# Patient Record
Sex: Male | Born: 1950 | Race: Black or African American | Hispanic: No | Smoking: Former smoker
Health system: Southern US, Community
[De-identification: ages and names within clinical notes are randomized; demographics above are authoritative.]

## PROBLEM LIST (undated history)

## (undated) DIAGNOSIS — I2510 Atherosclerotic heart disease of native coronary artery without angina pectoris: Secondary | ICD-10-CM

## (undated) DIAGNOSIS — K859 Acute pancreatitis without necrosis or infection, unspecified: Secondary | ICD-10-CM

## (undated) DIAGNOSIS — F1120 Opioid dependence, uncomplicated: Secondary | ICD-10-CM

## (undated) DIAGNOSIS — G8929 Other chronic pain: Secondary | ICD-10-CM

## (undated) DIAGNOSIS — M545 Low back pain, unspecified: Secondary | ICD-10-CM

## (undated) DIAGNOSIS — R7611 Nonspecific reaction to tuberculin skin test without active tuberculosis: Secondary | ICD-10-CM

## (undated) DIAGNOSIS — I4892 Unspecified atrial flutter: Secondary | ICD-10-CM

## (undated) DIAGNOSIS — E785 Hyperlipidemia, unspecified: Secondary | ICD-10-CM

## (undated) DIAGNOSIS — I10 Essential (primary) hypertension: Secondary | ICD-10-CM

## (undated) DIAGNOSIS — E119 Type 2 diabetes mellitus without complications: Secondary | ICD-10-CM

## (undated) DIAGNOSIS — J189 Pneumonia, unspecified organism: Secondary | ICD-10-CM

## (undated) DIAGNOSIS — IMO0002 Reserved for concepts with insufficient information to code with codable children: Secondary | ICD-10-CM

## (undated) DIAGNOSIS — K219 Gastro-esophageal reflux disease without esophagitis: Secondary | ICD-10-CM

## (undated) DIAGNOSIS — G9340 Encephalopathy, unspecified: Secondary | ICD-10-CM

## (undated) DIAGNOSIS — J449 Chronic obstructive pulmonary disease, unspecified: Secondary | ICD-10-CM

## (undated) DIAGNOSIS — K429 Umbilical hernia without obstruction or gangrene: Secondary | ICD-10-CM

## (undated) DIAGNOSIS — B1910 Unspecified viral hepatitis B without hepatic coma: Secondary | ICD-10-CM

## (undated) DIAGNOSIS — I509 Heart failure, unspecified: Secondary | ICD-10-CM

## (undated) DIAGNOSIS — F4310 Post-traumatic stress disorder, unspecified: Secondary | ICD-10-CM

## (undated) DIAGNOSIS — M4800 Spinal stenosis, site unspecified: Secondary | ICD-10-CM

## (undated) DIAGNOSIS — F329 Major depressive disorder, single episode, unspecified: Secondary | ICD-10-CM

## (undated) DIAGNOSIS — K469 Unspecified abdominal hernia without obstruction or gangrene: Secondary | ICD-10-CM

## (undated) DIAGNOSIS — F419 Anxiety disorder, unspecified: Secondary | ICD-10-CM

## (undated) DIAGNOSIS — K56609 Unspecified intestinal obstruction, unspecified as to partial versus complete obstruction: Secondary | ICD-10-CM

## (undated) DIAGNOSIS — I82409 Acute embolism and thrombosis of unspecified deep veins of unspecified lower extremity: Secondary | ICD-10-CM

## (undated) DIAGNOSIS — I219 Acute myocardial infarction, unspecified: Secondary | ICD-10-CM

## (undated) DIAGNOSIS — B1920 Unspecified viral hepatitis C without hepatic coma: Secondary | ICD-10-CM

## (undated) DIAGNOSIS — F32A Depression, unspecified: Secondary | ICD-10-CM

## (undated) DIAGNOSIS — G629 Polyneuropathy, unspecified: Secondary | ICD-10-CM

## (undated) HISTORY — DX: Hyperlipidemia, unspecified: E78.5

## (undated) HISTORY — DX: Nonspecific reaction to tuberculin skin test without active tuberculosis: R76.11

## (undated) HISTORY — PX: CORONARY ANGIOPLASTY WITH STENT PLACEMENT: SHX49

## (undated) HISTORY — DX: Anxiety disorder, unspecified: F41.9

## (undated) HISTORY — DX: Atherosclerotic heart disease of native coronary artery without angina pectoris: I25.10

## (undated) HISTORY — PX: TONSILLECTOMY AND ADENOIDECTOMY: SUR1326

## (undated) HISTORY — DX: Unspecified atrial flutter: I48.92

## (undated) HISTORY — DX: Unspecified abdominal hernia without obstruction or gangrene: K46.9

## (undated) HISTORY — PX: CHOLECYSTECTOMY: SHX55

## (undated) HISTORY — PX: SPINAL FUSION: SHX223

---

## 1997-06-27 DIAGNOSIS — R7611 Nonspecific reaction to tuberculin skin test without active tuberculosis: Secondary | ICD-10-CM

## 1997-06-27 HISTORY — DX: Nonspecific reaction to tuberculin skin test without active tuberculosis: R76.11

## 1998-05-03 ENCOUNTER — Encounter (HOSPITAL_COMMUNITY): Admission: RE | Admit: 1998-05-03 | Discharge: 1998-08-01 | Payer: Self-pay | Admitting: Psychiatry

## 2000-10-09 ENCOUNTER — Emergency Department (HOSPITAL_COMMUNITY): Admission: EM | Admit: 2000-10-09 | Discharge: 2000-10-09 | Payer: Self-pay | Admitting: Emergency Medicine

## 2000-11-12 ENCOUNTER — Emergency Department (HOSPITAL_COMMUNITY): Admission: EM | Admit: 2000-11-12 | Discharge: 2000-11-12 | Payer: Self-pay | Admitting: Emergency Medicine

## 2000-11-14 ENCOUNTER — Emergency Department (HOSPITAL_COMMUNITY): Admission: EM | Admit: 2000-11-14 | Discharge: 2000-11-14 | Payer: Self-pay | Admitting: Emergency Medicine

## 2001-04-04 ENCOUNTER — Emergency Department (HOSPITAL_COMMUNITY): Admission: EM | Admit: 2001-04-04 | Discharge: 2001-04-05 | Payer: Self-pay | Admitting: Emergency Medicine

## 2002-10-10 ENCOUNTER — Emergency Department (HOSPITAL_COMMUNITY): Admission: EM | Admit: 2002-10-10 | Discharge: 2002-10-10 | Payer: Self-pay | Admitting: Emergency Medicine

## 2003-05-01 ENCOUNTER — Emergency Department (HOSPITAL_COMMUNITY): Admission: EM | Admit: 2003-05-01 | Discharge: 2003-05-01 | Payer: Self-pay | Admitting: Emergency Medicine

## 2003-07-06 ENCOUNTER — Emergency Department (HOSPITAL_COMMUNITY): Admission: EM | Admit: 2003-07-06 | Discharge: 2003-07-07 | Payer: Self-pay | Admitting: Emergency Medicine

## 2003-11-15 ENCOUNTER — Emergency Department (HOSPITAL_COMMUNITY): Admission: EM | Admit: 2003-11-15 | Discharge: 2003-11-15 | Payer: Self-pay | Admitting: *Deleted

## 2004-02-14 ENCOUNTER — Emergency Department (HOSPITAL_COMMUNITY): Admission: EM | Admit: 2004-02-14 | Discharge: 2004-02-15 | Payer: Self-pay | Admitting: Emergency Medicine

## 2004-02-17 ENCOUNTER — Emergency Department (HOSPITAL_COMMUNITY): Admission: EM | Admit: 2004-02-17 | Discharge: 2004-02-17 | Payer: Self-pay | Admitting: Emergency Medicine

## 2005-02-25 ENCOUNTER — Ambulatory Visit: Payer: Self-pay | Admitting: Family Medicine

## 2005-11-13 ENCOUNTER — Ambulatory Visit: Payer: Self-pay | Admitting: Family Medicine

## 2006-08-15 ENCOUNTER — Emergency Department (HOSPITAL_COMMUNITY): Admission: EM | Admit: 2006-08-15 | Discharge: 2006-08-16 | Payer: Self-pay | Admitting: Emergency Medicine

## 2006-09-18 ENCOUNTER — Encounter
Admission: RE | Admit: 2006-09-18 | Discharge: 2006-12-17 | Payer: Self-pay | Admitting: Physical Medicine & Rehabilitation

## 2006-11-27 ENCOUNTER — Ambulatory Visit: Payer: Self-pay | Admitting: Physical Medicine & Rehabilitation

## 2006-11-27 ENCOUNTER — Telehealth: Payer: Self-pay | Admitting: Family Medicine

## 2007-01-27 ENCOUNTER — Telehealth: Payer: Self-pay | Admitting: Family Medicine

## 2007-01-29 DIAGNOSIS — I10 Essential (primary) hypertension: Secondary | ICD-10-CM | POA: Insufficient documentation

## 2007-04-08 ENCOUNTER — Ambulatory Visit: Payer: Self-pay | Admitting: Physical Medicine & Rehabilitation

## 2007-04-09 ENCOUNTER — Encounter
Admission: RE | Admit: 2007-04-09 | Discharge: 2007-04-09 | Payer: Self-pay | Admitting: Physical Medicine & Rehabilitation

## 2007-05-04 ENCOUNTER — Telehealth (INDEPENDENT_AMBULATORY_CARE_PROVIDER_SITE_OTHER): Payer: Self-pay | Admitting: *Deleted

## 2007-05-11 ENCOUNTER — Ambulatory Visit: Payer: Self-pay | Admitting: Family Medicine

## 2007-05-11 DIAGNOSIS — Z8719 Personal history of other diseases of the digestive system: Secondary | ICD-10-CM | POA: Insufficient documentation

## 2007-05-11 DIAGNOSIS — E119 Type 2 diabetes mellitus without complications: Secondary | ICD-10-CM

## 2007-05-11 DIAGNOSIS — K8020 Calculus of gallbladder without cholecystitis without obstruction: Secondary | ICD-10-CM | POA: Insufficient documentation

## 2007-05-11 DIAGNOSIS — K219 Gastro-esophageal reflux disease without esophagitis: Secondary | ICD-10-CM | POA: Insufficient documentation

## 2007-05-11 DIAGNOSIS — M545 Low back pain: Secondary | ICD-10-CM

## 2007-05-11 DIAGNOSIS — I2510 Atherosclerotic heart disease of native coronary artery without angina pectoris: Secondary | ICD-10-CM | POA: Insufficient documentation

## 2007-07-01 ENCOUNTER — Encounter
Admission: RE | Admit: 2007-07-01 | Discharge: 2007-07-13 | Payer: Self-pay | Admitting: Physical Medicine & Rehabilitation

## 2007-07-10 ENCOUNTER — Ambulatory Visit: Payer: Self-pay | Admitting: Physical Medicine & Rehabilitation

## 2007-08-12 ENCOUNTER — Telehealth: Payer: Self-pay | Admitting: Family Medicine

## 2007-10-02 ENCOUNTER — Encounter
Admission: RE | Admit: 2007-10-02 | Discharge: 2007-10-05 | Payer: Self-pay | Admitting: Physical Medicine & Rehabilitation

## 2007-10-05 ENCOUNTER — Ambulatory Visit: Payer: Self-pay | Admitting: Physical Medicine & Rehabilitation

## 2007-11-10 ENCOUNTER — Ambulatory Visit: Payer: Self-pay | Admitting: Family Medicine

## 2008-01-18 ENCOUNTER — Telehealth: Payer: Self-pay | Admitting: Family Medicine

## 2008-01-19 ENCOUNTER — Encounter: Payer: Self-pay | Admitting: Family Medicine

## 2008-01-28 ENCOUNTER — Telehealth: Payer: Self-pay | Admitting: Family Medicine

## 2008-02-10 ENCOUNTER — Telehealth: Payer: Self-pay | Admitting: Family Medicine

## 2008-03-20 ENCOUNTER — Inpatient Hospital Stay (HOSPITAL_COMMUNITY): Admission: EM | Admit: 2008-03-20 | Discharge: 2008-03-24 | Payer: Self-pay | Admitting: Emergency Medicine

## 2008-06-30 ENCOUNTER — Encounter
Admission: RE | Admit: 2008-06-30 | Discharge: 2008-07-01 | Payer: Self-pay | Admitting: Physical Medicine & Rehabilitation

## 2008-07-01 ENCOUNTER — Ambulatory Visit: Payer: Self-pay | Admitting: Physical Medicine & Rehabilitation

## 2008-07-12 ENCOUNTER — Encounter: Payer: Self-pay | Admitting: Family Medicine

## 2008-10-12 ENCOUNTER — Encounter: Admission: RE | Admit: 2008-10-12 | Discharge: 2008-10-12 | Payer: Self-pay | Admitting: Neurosurgery

## 2008-12-18 ENCOUNTER — Inpatient Hospital Stay (HOSPITAL_COMMUNITY): Admission: EM | Admit: 2008-12-18 | Discharge: 2008-12-19 | Payer: Self-pay | Admitting: Emergency Medicine

## 2008-12-18 ENCOUNTER — Ambulatory Visit: Payer: Self-pay | Admitting: Family Medicine

## 2008-12-31 ENCOUNTER — Ambulatory Visit: Payer: Self-pay | Admitting: Family Medicine

## 2008-12-31 ENCOUNTER — Inpatient Hospital Stay (HOSPITAL_COMMUNITY): Admission: RE | Admit: 2008-12-31 | Discharge: 2009-01-03 | Payer: Self-pay | Admitting: Family Medicine

## 2008-12-31 ENCOUNTER — Encounter: Payer: Self-pay | Admitting: Emergency Medicine

## 2009-01-13 ENCOUNTER — Encounter: Payer: Self-pay | Admitting: Emergency Medicine

## 2009-01-14 ENCOUNTER — Ambulatory Visit: Payer: Self-pay | Admitting: Family Medicine

## 2009-01-14 ENCOUNTER — Inpatient Hospital Stay (HOSPITAL_COMMUNITY): Admission: EM | Admit: 2009-01-14 | Discharge: 2009-01-17 | Payer: Self-pay | Admitting: Family Medicine

## 2009-01-17 ENCOUNTER — Ambulatory Visit: Payer: Self-pay | Admitting: Psychiatry

## 2009-01-17 ENCOUNTER — Inpatient Hospital Stay (HOSPITAL_COMMUNITY): Admission: AD | Admit: 2009-01-17 | Discharge: 2009-01-27 | Payer: Self-pay | Admitting: Psychiatry

## 2009-02-07 ENCOUNTER — Inpatient Hospital Stay (HOSPITAL_COMMUNITY): Admission: RE | Admit: 2009-02-07 | Discharge: 2009-02-15 | Payer: Self-pay | Admitting: Neurosurgery

## 2009-02-09 ENCOUNTER — Ambulatory Visit: Payer: Self-pay | Admitting: Physical Medicine & Rehabilitation

## 2009-03-15 ENCOUNTER — Emergency Department (HOSPITAL_COMMUNITY): Admission: EM | Admit: 2009-03-15 | Discharge: 2009-03-15 | Payer: Self-pay | Admitting: Emergency Medicine

## 2009-04-01 ENCOUNTER — Emergency Department (HOSPITAL_COMMUNITY): Admission: EM | Admit: 2009-04-01 | Discharge: 2009-04-01 | Payer: Self-pay | Admitting: Emergency Medicine

## 2009-06-17 ENCOUNTER — Emergency Department (HOSPITAL_COMMUNITY): Admission: EM | Admit: 2009-06-17 | Discharge: 2009-06-17 | Payer: Self-pay | Admitting: Emergency Medicine

## 2009-08-09 ENCOUNTER — Encounter (INDEPENDENT_AMBULATORY_CARE_PROVIDER_SITE_OTHER): Payer: Self-pay | Admitting: *Deleted

## 2009-08-09 ENCOUNTER — Emergency Department (HOSPITAL_COMMUNITY): Admission: EM | Admit: 2009-08-09 | Discharge: 2009-08-10 | Payer: Self-pay | Admitting: Emergency Medicine

## 2009-08-15 ENCOUNTER — Other Ambulatory Visit: Payer: Self-pay | Admitting: Emergency Medicine

## 2009-08-16 ENCOUNTER — Other Ambulatory Visit: Payer: Self-pay | Admitting: Emergency Medicine

## 2009-08-16 ENCOUNTER — Inpatient Hospital Stay (HOSPITAL_COMMUNITY): Admission: EM | Admit: 2009-08-16 | Discharge: 2009-08-29 | Payer: Self-pay | Admitting: Psychiatry

## 2009-08-16 ENCOUNTER — Ambulatory Visit: Payer: Self-pay | Admitting: Psychiatry

## 2009-10-03 ENCOUNTER — Encounter: Admission: RE | Admit: 2009-10-03 | Discharge: 2009-10-03 | Payer: Self-pay | Admitting: Cardiovascular Disease

## 2009-10-17 ENCOUNTER — Ambulatory Visit (HOSPITAL_COMMUNITY): Admission: RE | Admit: 2009-10-17 | Discharge: 2009-10-17 | Payer: Self-pay | Admitting: Cardiology

## 2009-11-19 ENCOUNTER — Emergency Department (HOSPITAL_COMMUNITY): Admission: EM | Admit: 2009-11-19 | Discharge: 2009-11-19 | Payer: Self-pay | Admitting: Emergency Medicine

## 2009-11-21 ENCOUNTER — Emergency Department (HOSPITAL_COMMUNITY): Admission: EM | Admit: 2009-11-21 | Discharge: 2009-11-22 | Payer: Self-pay | Admitting: Emergency Medicine

## 2009-12-14 ENCOUNTER — Ambulatory Visit: Payer: Self-pay | Admitting: Vascular Surgery

## 2009-12-20 ENCOUNTER — Emergency Department (HOSPITAL_COMMUNITY): Admission: EM | Admit: 2009-12-20 | Discharge: 2009-12-20 | Payer: Self-pay | Admitting: Emergency Medicine

## 2010-01-15 ENCOUNTER — Emergency Department (HOSPITAL_COMMUNITY): Admission: EM | Admit: 2010-01-15 | Discharge: 2010-01-15 | Payer: Self-pay | Admitting: Emergency Medicine

## 2010-04-26 ENCOUNTER — Inpatient Hospital Stay (HOSPITAL_COMMUNITY)
Admission: EM | Admit: 2010-04-26 | Discharge: 2010-05-01 | Payer: Self-pay | Source: Home / Self Care | Attending: Family Medicine | Admitting: Family Medicine

## 2010-04-27 ENCOUNTER — Encounter: Payer: Self-pay | Admitting: Family Medicine

## 2010-05-29 NOTE — Letter (Signed)
Summary: Referral - not able to see patient  [facility]  [address] [place], [place] [address]   Phone: [phone]  Fax: [phone]      August 09, 2009   [facility], [place] Earl Lites, M.D. [address] [employer], [place] [address]    Re:   Ryan [name] DOB:  [birth year] MRN:   [id number]    Dear Dr. Cleta Alberts:  Thank you for your kind referral of the above patient.  We have attempted to schedule the recommended procedure Screening Colonoscopy but have not been able to schedule because:   X  The patient was not available by phone and/or has not returned our calls.  ___ The patient declined to schedule the procedure at this time.  We appreciate the referral and hope that we will have the opportunity to treat this patient in the future.    Sincerely,    [facility] [phone]

## 2010-06-04 NOTE — H&P (Signed)
NAMEGAILEN, Ryan [name]               ACCOUNT NO.:  [id number]  MEDICAL RECORD NO.:  [id number]          PATIENT TYPE:  INP  LOCATION:  5019                         FACILITY:  [facility]  PHYSICIAN:  Doralee Albino     DATE OF BIRTH:  [birth year]  DATE OF ADMISSION:  04/26/2010 DATE OF DISCHARGE:                             HISTORY & PHYSICAL   DIAGNOSIS:  Forearm swelling.  PRIMARY CARE PROVIDER:  Dr. Samuel Jester at [facility].  HISTORY OF PRESENT ILLNESS:  The patient is a 60 year old African American male with right forearm and antecubital fossa and left hand cellulitis and abscess 3 days induration after IV access attempts, had a Coumadin Clinic.  REVIEW OF SYSTEMS:  As below.  The patient has a history of opioid overdose in the past from prescription oxycodone.  ALLERGIES:  MORPHINE, he gets swelling.  LISINOPRIL, he gets swelling.  MEDICATIONS: 1. Coumadin 10 mg. 2. Oxycodone 30 mg q.4 h. 3. Glipizide 10 mg b.i.d. 4. Metformin 850 mg t.i.d. 5. Losartan 100 mg daily. 6. Ranitidine 300 mg b.i.d. 7. Colace 100 mg b.i.d.  PAST MEDICAL HISTORY:  Diabetes type 2, chronic back pain, hypertension, DVT of the lower extremities, Coronary artery disease, MI 3 years ago, history of hep B, history of TB, history of opioid overdose.  PAST SURGICAL HISTORY:  Stent in 2009, back surgery in 2010, cholecystectomy in 2009, tonsillectomy in the past.  SOCIAL HISTORY:  Lives along with wife.  Occupation disabled.  Tobacco use yes.  Alcohol, no.  No drugs.  FAMILY HISTORY:  Noncontributory.  REVIEW OF SYSTEMS:  Positive for fever, loss of appetite 50-pound weight loss for the last 3 months.  Myalgias, the use of a walker and a cane.  PHYSICAL EXAMINATION:  VITAL SIGNS:  Temperature 98.5, pulse 92-113, respirations 16-20, blood pressure 133/68, pO2 99% on room air. GENERAL:  African American male, tall, dry skin, alert, oriented x3, pleasant. HEENT: Atraumatic, normocephalic. NECK:  Soft,  supple. CARDIOVASCULAR:  Regular rate and rhythm. LUNGS:  Clear to auscultation bilaterally.  Extremities: Right arm/antecubital fossa abscess, left hand/wrist cellulitis/abscess.  LABORATORY STUDIES:  INR 2.02.  CBC: 12.2, 14/42, 282, 76% neutrophils. BMP 135/4.2, 105/24, 14/0.8, platelets of 365.  X-ray of the right arm and x-ray of left hand reveals soft tissue swelling and gas with bowels on the and left dorsum of the hand and right proximal forearm.  ASSESSMENT AND PLAN:  The patient is a 60 year old African American male with right arm/left hand cellulitis/abscess for the incision and drainage with Orthopedics this afternoon.  PROBLEM LIST:  Right arm abscess, left hand abscess/cellulitis - likely MRSA.  We will get a screen.  We will start IV vancomycin 1.2 g q.12h. He will go to OR today with Dr. Merlyn Lot.  He will be placed on contact precautions. 1. Diabetes mellitus type 2.  He is hyperglycemic above 300.  We will     start a sliding scale insulin.  We will hold his metformin and his     glipizide and we will get a hemoglobin A1c. 2. Hypertension, I restart his home meds. 3. History of deep venous thrombosis.  Continue with Coumadin per     pharmacy.  INR is currently 2.08. 4. Coronary artery disease with a history of myocardial infarction.     Start aspirin 81 mg/metoprolol 25 mg b.i.d. He has allergy to ACE, we will hold off on ACE inhibitor. 1. History of hepatitis B/drug abuse.  We will get a CMP. 2. Pain, oxycodone 50 mg q.4 h. p.r.n. pain after his surgery and he     will receive 2 mg of Dilaudid q.2 h. prior to surgery. 3. FEN/GI:  Diabetic diet.  IV fluids x1 L and then maintenance fluid. 4. Disposition.  Pending surgery/clinical improvement.    ______________________________ Edd Arbour, MD   ______________________________ Doralee Albino    JO/MEDQ  D:  04/26/2010  T:  04/27/2010  Job:  [id number]  Electronically Signed by Edd Arbour MD on 05/01/2010  02:51:05 PM Electronically Signed by Doralee Albino M.D. on 06/04/2010 10:16:33 AM

## 2010-07-09 ENCOUNTER — Emergency Department (HOSPITAL_COMMUNITY)
Admission: EM | Admit: 2010-07-09 | Discharge: 2010-07-09 | Disposition: A | Discharge status: Home / Self Care | Payer: Medicare Other | Attending: Emergency Medicine | Admitting: Emergency Medicine

## 2010-07-09 ENCOUNTER — Emergency Department (HOSPITAL_COMMUNITY): Admission: EM | Admit: 2010-07-09 | Payer: Self-pay | Source: Home / Self Care

## 2010-07-09 DIAGNOSIS — R404 Transient alteration of awareness: Secondary | ICD-10-CM | POA: Insufficient documentation

## 2010-07-09 DIAGNOSIS — G8929 Other chronic pain: Secondary | ICD-10-CM | POA: Insufficient documentation

## 2010-07-09 DIAGNOSIS — M546 Pain in thoracic spine: Secondary | ICD-10-CM | POA: Insufficient documentation

## 2010-07-09 DIAGNOSIS — E119 Type 2 diabetes mellitus without complications: Secondary | ICD-10-CM | POA: Insufficient documentation

## 2010-07-09 DIAGNOSIS — R4182 Altered mental status, unspecified: Secondary | ICD-10-CM | POA: Insufficient documentation

## 2010-07-09 DIAGNOSIS — Z79899 Other long term (current) drug therapy: Secondary | ICD-10-CM | POA: Insufficient documentation

## 2010-07-09 DIAGNOSIS — M545 Low back pain, unspecified: Secondary | ICD-10-CM | POA: Insufficient documentation

## 2010-07-09 DIAGNOSIS — I10 Essential (primary) hypertension: Secondary | ICD-10-CM | POA: Insufficient documentation

## 2010-07-09 LAB — POCT I-STAT, CHEM 8
BUN: 14 mg/dL (ref 6–23)
Chloride: 105 mEq/L (ref 96–112)
Glucose, Bld: 365 mg/dL — ABNORMAL HIGH (ref 70–99)
HCT: 46 % (ref 39.0–52.0)
Potassium: 4.2 mEq/L (ref 3.5–5.1)
Sodium: 135 mEq/L (ref 135–145)
TCO2: 24 mmol/L (ref 0–100)

## 2010-07-09 LAB — RAPID URINE DRUG SCREEN, HOSP PERFORMED
Amphetamines: NOT DETECTED
Barbiturates: NOT DETECTED
Cocaine: POSITIVE — AB
Opiates: POSITIVE — AB

## 2010-07-09 LAB — CBC
HCT: 38.7 % — ABNORMAL LOW (ref 39.0–52.0)
HCT: 42.5 % (ref 39.0–52.0)
Hemoglobin: 12.6 g/dL — ABNORMAL LOW (ref 13.0–17.0)
Hemoglobin: 14 g/dL (ref 13.0–17.0)
MCH: 31.2 pg (ref 26.0–34.0)
MCH: 31.3 pg (ref 26.0–34.0)
MCHC: 31.9 g/dL (ref 30.0–36.0)
MCHC: 32.9 g/dL (ref 30.0–36.0)
MCV: 94.7 fL (ref 78.0–100.0)
MCV: 97 fL (ref 78.0–100.0)
Platelets: 276 10*3/uL (ref 150–400)
Platelets: 282 10*3/uL (ref 150–400)
RBC: 4.02 MIL/uL — ABNORMAL LOW (ref 4.22–5.81)
RDW: 13.3 % (ref 11.5–15.5)
RDW: 13.5 % (ref 11.5–15.5)
RDW: 13.7 % (ref 11.5–15.5)
WBC: 8.2 10*3/uL (ref 4.0–10.5)

## 2010-07-09 LAB — GLUCOSE, CAPILLARY
Glucose-Capillary: 141 mg/dL — ABNORMAL HIGH (ref 70–99)
Glucose-Capillary: 195 mg/dL — ABNORMAL HIGH (ref 70–99)
Glucose-Capillary: 202 mg/dL — ABNORMAL HIGH (ref 70–99)
Glucose-Capillary: 242 mg/dL — ABNORMAL HIGH (ref 70–99)
Glucose-Capillary: 244 mg/dL — ABNORMAL HIGH (ref 70–99)
Glucose-Capillary: 250 mg/dL — ABNORMAL HIGH (ref 70–99)
Glucose-Capillary: 260 mg/dL — ABNORMAL HIGH (ref 70–99)
Glucose-Capillary: 347 mg/dL — ABNORMAL HIGH (ref 70–99)
Glucose-Capillary: 353 mg/dL — ABNORMAL HIGH (ref 70–99)
Glucose-Capillary: 359 mg/dL — ABNORMAL HIGH (ref 70–99)
Glucose-Capillary: 384 mg/dL — ABNORMAL HIGH (ref 70–99)
Glucose-Capillary: 395 mg/dL — ABNORMAL HIGH (ref 70–99)
Glucose-Capillary: 399 mg/dL — ABNORMAL HIGH (ref 70–99)
Glucose-Capillary: 403 mg/dL — ABNORMAL HIGH (ref 70–99)
Glucose-Capillary: 404 mg/dL — ABNORMAL HIGH (ref 70–99)
Glucose-Capillary: 414 mg/dL — ABNORMAL HIGH (ref 70–99)
Glucose-Capillary: 450 mg/dL — ABNORMAL HIGH (ref 70–99)
Glucose-Capillary: 501 mg/dL — ABNORMAL HIGH (ref 70–99)

## 2010-07-09 LAB — CULTURE, BLOOD (ROUTINE X 2)
Culture  Setup Time: 201112292019
Culture  Setup Time: 201112292019
Culture: NO GROWTH
Culture: NO GROWTH

## 2010-07-09 LAB — COMPREHENSIVE METABOLIC PANEL
AST: 19 U/L (ref 0–37)
Albumin: 2.5 g/dL — ABNORMAL LOW (ref 3.5–5.2)
Calcium: 8.6 mg/dL (ref 8.4–10.5)
Creatinine, Ser: 1.01 mg/dL (ref 0.4–1.5)
GFR calc Af Amer: >60 mL/min (ref >60)
Sodium: 129 mEq/L — ABNORMAL LOW (ref 135–145)
Total Protein: 6.2 g/dL (ref 6.0–8.3)

## 2010-07-09 LAB — DIFFERENTIAL
Basophils Absolute: 0 10*3/uL (ref 0.0–0.1)
Lymphocytes Relative: 16 % (ref 12–46)
Monocytes Relative: 8 % (ref 3–12)
Neutro Abs: 9.3 10*3/uL — ABNORMAL HIGH (ref 1.7–7.7)
Neutrophils Relative %: 76 % (ref 43–77)

## 2010-07-09 LAB — HEMOGLOBIN A1C
Hgb A1c MFr Bld: 12.5 % — ABNORMAL HIGH (ref <5.7)
Mean Plasma Glucose: 312 mg/dL — ABNORMAL HIGH (ref <117)

## 2010-07-09 LAB — BASIC METABOLIC PANEL
BUN: 10 mg/dL (ref 6–23)
CO2: 25 mEq/L (ref 19–32)
Calcium: 9.1 mg/dL (ref 8.4–10.5)
Chloride: 101 mEq/L (ref 96–112)
Creatinine, Ser: 0.89 mg/dL (ref 0.4–1.5)
GFR calc Af Amer: >60 mL/min (ref >60)
Glucose, Bld: 491 mg/dL — ABNORMAL HIGH (ref 70–99)
Potassium: 4.3 mEq/L (ref 3.5–5.1)
Sodium: 129 mEq/L — ABNORMAL LOW (ref 135–145)
Sodium: 132 mEq/L — ABNORMAL LOW (ref 135–145)

## 2010-07-09 LAB — AFB CULTURE WITH SMEAR (NOT AT ARMC): Acid Fast Smear: NONE SEEN

## 2010-07-09 LAB — FUNGAL STAIN: Fungal Smear: NONE SEEN

## 2010-07-09 LAB — PROTIME-INR
INR: 2.56 — ABNORMAL HIGH (ref 0.00–1.49)
INR: 2.64 — ABNORMAL HIGH (ref 0.00–1.49)
Prothrombin Time: 23 seconds — ABNORMAL HIGH (ref 11.6–15.2)
Prothrombin Time: 27.6 seconds — ABNORMAL HIGH (ref 11.6–15.2)

## 2010-07-09 LAB — CULTURE, ROUTINE-ABSCESS

## 2010-07-09 LAB — MRSA PCR SCREENING: MRSA by PCR: NEGATIVE

## 2010-07-09 LAB — ANAEROBIC CULTURE

## 2010-07-12 LAB — RAPID URINE DRUG SCREEN, HOSP PERFORMED
Amphetamines: NOT DETECTED
Benzodiazepines: POSITIVE — AB
Opiates: NOT DETECTED
Tetrahydrocannabinol: NOT DETECTED

## 2010-07-12 LAB — BASIC METABOLIC PANEL
BUN: 5 mg/dL — ABNORMAL LOW (ref 6–23)
Calcium: 9.2 mg/dL (ref 8.4–10.5)
Creatinine, Ser: 0.83 mg/dL (ref 0.4–1.5)
GFR calc non Af Amer: >60 mL/min (ref >60)
Glucose, Bld: 181 mg/dL — ABNORMAL HIGH (ref 70–99)
Sodium: 139 mEq/L (ref 135–145)

## 2010-07-12 LAB — DIFFERENTIAL
Basophils Absolute: 0 10*3/uL (ref 0.0–0.1)
Basophils Relative: 1 % (ref 0–1)
Eosinophils Absolute: 0.1 10*3/uL (ref 0.0–0.7)
Neutro Abs: 2.6 10*3/uL (ref 1.7–7.7)
Neutrophils Relative %: 48 % (ref 43–77)

## 2010-07-12 LAB — CBC
Hemoglobin: 14.3 g/dL (ref 13.0–17.0)
MCHC: 33.1 g/dL (ref 30.0–36.0)
Platelets: 196 10*3/uL (ref 150–400)
RDW: 13.6 % (ref 11.5–15.5)

## 2010-07-14 LAB — DIFFERENTIAL
Basophils Relative: 1 % (ref 0–1)
Eosinophils Absolute: 0 10*3/uL (ref 0.0–0.7)
Eosinophils Absolute: 0.1 10*3/uL (ref 0.0–0.7)
Eosinophils Relative: 0 % (ref 0–5)
Lymphocytes Relative: 24 % (ref 12–46)
Lymphs Abs: 1.4 10*3/uL (ref 0.7–4.0)
Lymphs Abs: 1.9 10*3/uL (ref 0.7–4.0)
Monocytes Relative: 7 % (ref 3–12)
Neutro Abs: 5.2 10*3/uL (ref 1.7–7.7)
Neutrophils Relative %: 73 % (ref 43–77)

## 2010-07-14 LAB — CBC
HCT: 47.6 % (ref 39.0–52.0)
HCT: 47.9 % (ref 39.0–52.0)
Hemoglobin: 15.8 g/dL (ref 13.0–17.0)
Hemoglobin: 16.3 g/dL (ref 13.0–17.0)
MCHC: 33.1 g/dL (ref 30.0–36.0)
MCV: 96.3 fL (ref 78.0–100.0)
RDW: 14.2 % (ref 11.5–15.5)
WBC: 7.6 10*3/uL (ref 4.0–10.5)

## 2010-07-14 LAB — URINE MICROSCOPIC-ADD ON

## 2010-07-14 LAB — BASIC METABOLIC PANEL
BUN: 9 mg/dL (ref 6–23)
Calcium: 9.6 mg/dL (ref 8.4–10.5)
Chloride: 100 mEq/L (ref 96–112)
GFR calc Af Amer: >60 mL/min (ref >60)
GFR calc non Af Amer: >60 mL/min (ref >60)
GFR calc non Af Amer: >60 mL/min (ref >60)
Glucose, Bld: 285 mg/dL — ABNORMAL HIGH (ref 70–99)
Potassium: 3.9 mEq/L (ref 3.5–5.1)
Potassium: 4 mEq/L (ref 3.5–5.1)
Sodium: 133 mEq/L — ABNORMAL LOW (ref 135–145)
Sodium: 138 mEq/L (ref 135–145)

## 2010-07-14 LAB — URINALYSIS, ROUTINE W REFLEX MICROSCOPIC
Glucose, UA: >1000 mg/dL — AB
Hgb urine dipstick: NEGATIVE
Protein, ur: NEGATIVE mg/dL

## 2010-07-14 LAB — PROTIME-INR: Prothrombin Time: 12.7 seconds (ref 11.6–15.2)

## 2010-07-14 LAB — URINE CULTURE: Culture  Setup Time: 201107250001

## 2010-07-15 LAB — HEMOGLOBIN A1C
Hgb A1c MFr Bld: 11.1 % — ABNORMAL HIGH (ref <5.7)
Mean Plasma Glucose: 272 mg/dL — ABNORMAL HIGH (ref <117)

## 2010-07-15 LAB — GLUCOSE, CAPILLARY: Glucose-Capillary: 205 mg/dL — ABNORMAL HIGH (ref 70–99)

## 2010-07-17 LAB — DIFFERENTIAL
Basophils Absolute: 0.1 10*3/uL (ref 0.0–0.1)
Eosinophils Relative: 3 % (ref 0–5)
Lymphocytes Relative: 26 % (ref 12–46)
Neutro Abs: 2.8 10*3/uL (ref 1.7–7.7)
Neutrophils Relative %: 62 % (ref 43–77)

## 2010-07-17 LAB — GLUCOSE, CAPILLARY
Glucose-Capillary: 133 mg/dL — ABNORMAL HIGH (ref 70–99)
Glucose-Capillary: 142 mg/dL — ABNORMAL HIGH (ref 70–99)
Glucose-Capillary: 161 mg/dL — ABNORMAL HIGH (ref 70–99)
Glucose-Capillary: 197 mg/dL — ABNORMAL HIGH (ref 70–99)
Glucose-Capillary: 199 mg/dL — ABNORMAL HIGH (ref 70–99)
Glucose-Capillary: 210 mg/dL — ABNORMAL HIGH (ref 70–99)
Glucose-Capillary: 318 mg/dL — ABNORMAL HIGH (ref 70–99)
Glucose-Capillary: 344 mg/dL — ABNORMAL HIGH (ref 70–99)
Glucose-Capillary: 120 mg/dL — ABNORMAL HIGH (ref 70–99)
Glucose-Capillary: 126 mg/dL — ABNORMAL HIGH (ref 70–99)
Glucose-Capillary: 132 mg/dL — ABNORMAL HIGH (ref 70–99)
Glucose-Capillary: 133 mg/dL — ABNORMAL HIGH (ref 70–99)
Glucose-Capillary: 136 mg/dL — ABNORMAL HIGH (ref 70–99)
Glucose-Capillary: 137 mg/dL — ABNORMAL HIGH (ref 70–99)
Glucose-Capillary: 138 mg/dL — ABNORMAL HIGH (ref 70–99)
Glucose-Capillary: 148 mg/dL — ABNORMAL HIGH (ref 70–99)
Glucose-Capillary: 155 mg/dL — ABNORMAL HIGH (ref 70–99)
Glucose-Capillary: 156 mg/dL — ABNORMAL HIGH (ref 70–99)
Glucose-Capillary: 156 mg/dL — ABNORMAL HIGH (ref 70–99)
Glucose-Capillary: 161 mg/dL — ABNORMAL HIGH (ref 70–99)
Glucose-Capillary: 173 mg/dL — ABNORMAL HIGH (ref 70–99)
Glucose-Capillary: 174 mg/dL — ABNORMAL HIGH (ref 70–99)
Glucose-Capillary: 180 mg/dL — ABNORMAL HIGH (ref 70–99)
Glucose-Capillary: 193 mg/dL — ABNORMAL HIGH (ref 70–99)
Glucose-Capillary: 208 mg/dL — ABNORMAL HIGH (ref 70–99)
Glucose-Capillary: 81 mg/dL (ref 70–99)
Glucose-Capillary: 81 mg/dL (ref 70–99)
Glucose-Capillary: 88 mg/dL (ref 70–99)
Glucose-Capillary: 96 mg/dL (ref 70–99)
Glucose-Capillary: 99 mg/dL (ref 70–99)

## 2010-07-17 LAB — COMPREHENSIVE METABOLIC PANEL
AST: 22 U/L (ref 0–37)
BUN: 7 mg/dL (ref 6–23)
CO2: 27 mEq/L (ref 19–32)
Chloride: 101 mEq/L (ref 96–112)
Creatinine, Ser: 0.8 mg/dL (ref 0.4–1.5)
GFR calc non Af Amer: >60 mL/min (ref >60)
Glucose, Bld: 386 mg/dL — ABNORMAL HIGH (ref 70–99)
Total Bilirubin: 0.2 mg/dL — ABNORMAL LOW (ref 0.3–1.2)

## 2010-07-17 LAB — CBC
HCT: 40.8 % (ref 39.0–52.0)
Hemoglobin: 13.7 g/dL (ref 13.0–17.0)
MCV: 94 fL (ref 78.0–100.0)
RBC: 4.34 MIL/uL (ref 4.22–5.81)
WBC: 4.5 10*3/uL (ref 4.0–10.5)

## 2010-07-17 LAB — URINALYSIS, ROUTINE W REFLEX MICROSCOPIC
Glucose, UA: >1000 mg/dL — AB
Hgb urine dipstick: NEGATIVE
Protein, ur: NEGATIVE mg/dL
Specific Gravity, Urine: 1.04 — ABNORMAL HIGH (ref 1.005–1.030)

## 2010-07-17 LAB — RAPID URINE DRUG SCREEN, HOSP PERFORMED
Barbiturates: NOT DETECTED
Benzodiazepines: POSITIVE — AB

## 2010-07-17 LAB — URINE MICROSCOPIC-ADD ON

## 2010-07-18 LAB — RAPID URINE DRUG SCREEN, HOSP PERFORMED
Amphetamines: NOT DETECTED
Cocaine: NOT DETECTED
Opiates: POSITIVE — AB
Tetrahydrocannabinol: NOT DETECTED

## 2010-07-18 LAB — BASIC METABOLIC PANEL
CO2: 31 mEq/L (ref 19–32)
GFR calc non Af Amer: >60 mL/min (ref >60)
Glucose, Bld: 311 mg/dL — ABNORMAL HIGH (ref 70–99)
Potassium: 4.4 mEq/L (ref 3.5–5.1)
Sodium: 130 mEq/L — ABNORMAL LOW (ref 135–145)

## 2010-07-18 LAB — URINALYSIS, ROUTINE W REFLEX MICROSCOPIC
Bilirubin Urine: NEGATIVE
Ketones, ur: NEGATIVE mg/dL
Nitrite: POSITIVE — AB
Protein, ur: NEGATIVE mg/dL
Urobilinogen, UA: 0.2 mg/dL (ref 0.0–1.0)

## 2010-07-18 LAB — DIFFERENTIAL
Basophils Absolute: 0.1 10*3/uL (ref 0.0–0.1)
Eosinophils Relative: 5 % (ref 0–5)
Lymphocytes Relative: 22 % (ref 12–46)
Monocytes Absolute: 0.6 10*3/uL (ref 0.1–1.0)

## 2010-07-18 LAB — CBC
HCT: 40 % (ref 39.0–52.0)
Hemoglobin: 13.3 g/dL (ref 13.0–17.0)
MCHC: 33.4 g/dL (ref 30.0–36.0)
RDW: 15.8 % — ABNORMAL HIGH (ref 11.5–15.5)

## 2010-07-31 LAB — COMPREHENSIVE METABOLIC PANEL
ALT: 16 U/L (ref 0–53)
Alkaline Phosphatase: 82 U/L (ref 39–117)
CO2: 21 mEq/L (ref 19–32)
Chloride: 106 mEq/L (ref 96–112)
GFR calc non Af Amer: >60 mL/min (ref >60)
Glucose, Bld: 157 mg/dL — ABNORMAL HIGH (ref 70–99)
Potassium: 3.6 mEq/L (ref 3.5–5.1)
Sodium: 133 mEq/L — ABNORMAL LOW (ref 135–145)
Total Bilirubin: 0.3 mg/dL (ref 0.3–1.2)

## 2010-07-31 LAB — DIFFERENTIAL
Basophils Absolute: 0 10*3/uL (ref 0.0–0.1)
Basophils Relative: 0 % (ref 0–1)
Lymphocytes Relative: 21 % (ref 12–46)
Neutro Abs: 5.7 10*3/uL (ref 1.7–7.7)

## 2010-07-31 LAB — URINALYSIS, ROUTINE W REFLEX MICROSCOPIC
Ketones, ur: NEGATIVE mg/dL
Nitrite: NEGATIVE
Specific Gravity, Urine: 1.011 (ref 1.005–1.030)
Urobilinogen, UA: 0.2 mg/dL (ref 0.0–1.0)
pH: 6 (ref 5.0–8.0)

## 2010-07-31 LAB — CBC
MCHC: 31.9 g/dL (ref 30.0–36.0)
Platelets: 252 10*3/uL (ref 150–400)
RDW: 14.7 % (ref 11.5–15.5)

## 2010-07-31 LAB — URINE MICROSCOPIC-ADD ON

## 2010-07-31 LAB — LIPASE, BLOOD: Lipase: 81 U/L — ABNORMAL HIGH (ref 11–59)

## 2010-08-01 LAB — COMPREHENSIVE METABOLIC PANEL
ALT: 46 U/L (ref 0–53)
Alkaline Phosphatase: 115 U/L (ref 39–117)
BUN: 10 mg/dL (ref 6–23)
CO2: 29 mEq/L (ref 19–32)
Chloride: 103 mEq/L (ref 96–112)
GFR calc non Af Amer: >60 mL/min (ref >60)
Glucose, Bld: 137 mg/dL — ABNORMAL HIGH (ref 70–99)
Potassium: 3.9 mEq/L (ref 3.5–5.1)
Sodium: 135 mEq/L (ref 135–145)
Total Bilirubin: 0.5 mg/dL (ref 0.3–1.2)

## 2010-08-01 LAB — DIFFERENTIAL
Basophils Absolute: 0 10*3/uL (ref 0.0–0.1)
Basophils Relative: 0 % (ref 0–1)
Eosinophils Absolute: 0.2 10*3/uL (ref 0.0–0.7)
Lymphocytes Relative: 26 % (ref 12–46)
Monocytes Absolute: 0.6 10*3/uL (ref 0.1–1.0)
Neutrophils Relative %: 62 % (ref 43–77)

## 2010-08-01 LAB — URINALYSIS, ROUTINE W REFLEX MICROSCOPIC
Bilirubin Urine: NEGATIVE
Hgb urine dipstick: NEGATIVE
Ketones, ur: NEGATIVE mg/dL
Nitrite: POSITIVE — AB
Specific Gravity, Urine: 1.012 (ref 1.005–1.030)
pH: 7.5 (ref 5.0–8.0)

## 2010-08-01 LAB — CBC
HCT: 33.3 % — ABNORMAL LOW (ref 39.0–52.0)
Hemoglobin: 10.6 g/dL — ABNORMAL LOW (ref 13.0–17.0)
RBC: 3.46 MIL/uL — ABNORMAL LOW (ref 4.22–5.81)
RDW: 14.7 % (ref 11.5–15.5)
WBC: 6.7 10*3/uL (ref 4.0–10.5)

## 2010-08-01 LAB — URINE MICROSCOPIC-ADD ON

## 2010-08-01 LAB — MAGNESIUM: Magnesium: 2.5 mg/dL (ref 1.5–2.5)

## 2010-08-01 LAB — LIPASE, BLOOD: Lipase: 38 U/L (ref 11–59)

## 2010-08-02 LAB — POCT I-STAT 4, (NA,K, GLUC, HGB,HCT)
Glucose, Bld: 95 mg/dL (ref 70–99)
HCT: 43 % (ref 39.0–52.0)
Hemoglobin: 14.6 g/dL (ref 13.0–17.0)
Potassium: 3.8 mEq/L (ref 3.5–5.1)

## 2010-08-02 LAB — HEPATIC FUNCTION PANEL
ALT: 20 U/L (ref 0–53)
Albumin: 3.5 g/dL (ref 3.5–5.2)
Alkaline Phosphatase: 63 U/L (ref 39–117)
Total Protein: 7.1 g/dL (ref 6.0–8.3)

## 2010-08-02 LAB — GLUCOSE, CAPILLARY
Glucose-Capillary: 110 mg/dL — ABNORMAL HIGH (ref 70–99)
Glucose-Capillary: 130 mg/dL — ABNORMAL HIGH (ref 70–99)
Glucose-Capillary: 139 mg/dL — ABNORMAL HIGH (ref 70–99)
Glucose-Capillary: 154 mg/dL — ABNORMAL HIGH (ref 70–99)
Glucose-Capillary: 167 mg/dL — ABNORMAL HIGH (ref 70–99)
Glucose-Capillary: 172 mg/dL — ABNORMAL HIGH (ref 70–99)
Glucose-Capillary: 197 mg/dL — ABNORMAL HIGH (ref 70–99)
Glucose-Capillary: 94 mg/dL (ref 70–99)
Glucose-Capillary: 114 mg/dL — ABNORMAL HIGH (ref 70–99)
Glucose-Capillary: 125 mg/dL — ABNORMAL HIGH (ref 70–99)
Glucose-Capillary: 134 mg/dL — ABNORMAL HIGH (ref 70–99)
Glucose-Capillary: 160 mg/dL — ABNORMAL HIGH (ref 70–99)
Glucose-Capillary: 185 mg/dL — ABNORMAL HIGH (ref 70–99)
Glucose-Capillary: 187 mg/dL — ABNORMAL HIGH (ref 70–99)
Glucose-Capillary: 187 mg/dL — ABNORMAL HIGH (ref 70–99)
Glucose-Capillary: 87 mg/dL (ref 70–99)

## 2010-08-02 LAB — CBC
RBC: 4.31 MIL/uL (ref 4.22–5.81)
WBC: 5.5 10*3/uL (ref 4.0–10.5)

## 2010-08-02 LAB — BASIC METABOLIC PANEL
Calcium: 9.5 mg/dL (ref 8.4–10.5)
Chloride: 98 mEq/L (ref 96–112)
Creatinine, Ser: 0.96 mg/dL (ref 0.4–1.5)
GFR calc Af Amer: >60 mL/min (ref >60)
Sodium: 133 mEq/L — ABNORMAL LOW (ref 135–145)

## 2010-08-03 LAB — GLUCOSE, CAPILLARY
Glucose-Capillary: 120 mg/dL — ABNORMAL HIGH (ref 70–99)
Glucose-Capillary: 150 mg/dL — ABNORMAL HIGH (ref 70–99)
Glucose-Capillary: 152 mg/dL — ABNORMAL HIGH (ref 70–99)
Glucose-Capillary: 191 mg/dL — ABNORMAL HIGH (ref 70–99)
Glucose-Capillary: 194 mg/dL — ABNORMAL HIGH (ref 70–99)
Glucose-Capillary: 200 mg/dL — ABNORMAL HIGH (ref 70–99)
Glucose-Capillary: 216 mg/dL — ABNORMAL HIGH (ref 70–99)
Glucose-Capillary: 279 mg/dL — ABNORMAL HIGH (ref 70–99)
Glucose-Capillary: 115 mg/dL — ABNORMAL HIGH (ref 70–99)
Glucose-Capillary: 115 mg/dL — ABNORMAL HIGH (ref 70–99)
Glucose-Capillary: 123 mg/dL — ABNORMAL HIGH (ref 70–99)
Glucose-Capillary: 134 mg/dL — ABNORMAL HIGH (ref 70–99)
Glucose-Capillary: 149 mg/dL — ABNORMAL HIGH (ref 70–99)
Glucose-Capillary: 162 mg/dL — ABNORMAL HIGH (ref 70–99)
Glucose-Capillary: 182 mg/dL — ABNORMAL HIGH (ref 70–99)
Glucose-Capillary: 187 mg/dL — ABNORMAL HIGH (ref 70–99)
Glucose-Capillary: 223 mg/dL — ABNORMAL HIGH (ref 70–99)
Glucose-Capillary: 56 mg/dL — ABNORMAL LOW (ref 70–99)
Glucose-Capillary: 58 mg/dL — ABNORMAL LOW (ref 70–99)
Glucose-Capillary: 59 mg/dL — ABNORMAL LOW (ref 70–99)
Glucose-Capillary: 62 mg/dL — ABNORMAL LOW (ref 70–99)
Glucose-Capillary: 64 mg/dL — ABNORMAL LOW (ref 70–99)
Glucose-Capillary: 69 mg/dL — ABNORMAL LOW (ref 70–99)
Glucose-Capillary: 70 mg/dL (ref 70–99)
Glucose-Capillary: 76 mg/dL (ref 70–99)
Glucose-Capillary: 95 mg/dL (ref 70–99)
Glucose-Capillary: 97 mg/dL (ref 70–99)
Glucose-Capillary: 99 mg/dL (ref 70–99)
Glucose-Capillary: 153 mg/dL — ABNORMAL HIGH (ref 70–99)
Glucose-Capillary: 160 mg/dL — ABNORMAL HIGH (ref 70–99)
Glucose-Capillary: 179 mg/dL — ABNORMAL HIGH (ref 70–99)
Glucose-Capillary: 183 mg/dL — ABNORMAL HIGH (ref 70–99)
Glucose-Capillary: 186 mg/dL — ABNORMAL HIGH (ref 70–99)
Glucose-Capillary: 221 mg/dL — ABNORMAL HIGH (ref 70–99)
Glucose-Capillary: 224 mg/dL — ABNORMAL HIGH (ref 70–99)
Glucose-Capillary: 233 mg/dL — ABNORMAL HIGH (ref 70–99)
Glucose-Capillary: 243 mg/dL — ABNORMAL HIGH (ref 70–99)
Glucose-Capillary: 243 mg/dL — ABNORMAL HIGH (ref 70–99)
Glucose-Capillary: 271 mg/dL — ABNORMAL HIGH (ref 70–99)

## 2010-08-03 LAB — URINALYSIS, MICROSCOPIC ONLY
Bilirubin Urine: NEGATIVE
Glucose, UA: NEGATIVE mg/dL
Hgb urine dipstick: NEGATIVE
Ketones, ur: NEGATIVE mg/dL
Nitrite: POSITIVE — AB
Specific Gravity, Urine: 1.006 (ref 1.005–1.030)
Urobilinogen, UA: 0.2 mg/dL (ref 0.0–1.0)
pH: 6 (ref 5.0–8.0)
pH: 5.5 (ref 5.0–8.0)

## 2010-08-03 LAB — CARDIAC PANEL(CRET KIN+CKTOT+MB+TROPI)
Relative Index: 5 — ABNORMAL HIGH (ref 0.0–2.5)
Total CK: 583 U/L — ABNORMAL HIGH (ref 7–232)
Troponin I: 0.01 ng/mL (ref 0.00–0.06)
Troponin I: 0.02 ng/mL (ref 0.00–0.06)

## 2010-08-03 LAB — CBC
HCT: 33.3 % — ABNORMAL LOW (ref 39.0–52.0)
HCT: 36.3 % — ABNORMAL LOW (ref 39.0–52.0)
HCT: 42.6 % (ref 39.0–52.0)
HCT: 42.3 % (ref 39.0–52.0)
Hemoglobin: 11.7 g/dL — ABNORMAL LOW (ref 13.0–17.0)
Hemoglobin: 12.1 g/dL — ABNORMAL LOW (ref 13.0–17.0)
Hemoglobin: 14 g/dL (ref 13.0–17.0)
MCHC: 33.2 g/dL (ref 30.0–36.0)
MCHC: 33.3 g/dL (ref 30.0–36.0)
MCHC: 33 g/dL (ref 30.0–36.0)
MCV: 96 fL (ref 78.0–100.0)
MCV: 96.4 fL (ref 78.0–100.0)
Platelets: 189 10*3/uL (ref 150–400)
Platelets: 193 K/uL (ref 150–400)
Platelets: 242 10*3/uL (ref 150–400)
Platelets: 239 10*3/uL (ref 150–400)
RBC: 3.65 MIL/uL — ABNORMAL LOW (ref 4.22–5.81)
RBC: 3.78 MIL/uL — ABNORMAL LOW (ref 4.22–5.81)
RDW: 14.1 % (ref 11.5–15.5)
RDW: 14.1 % (ref 11.5–15.5)
RDW: 14.3 % (ref 11.5–15.5)
RDW: 14.4 % (ref 11.5–15.5)
WBC: 12.4 10*3/uL — ABNORMAL HIGH (ref 4.0–10.5)
WBC: 6.2 K/uL (ref 4.0–10.5)
WBC: 6.3 10*3/uL (ref 4.0–10.5)
WBC: 10.3 10*3/uL (ref 4.0–10.5)

## 2010-08-03 LAB — LACTIC ACID, PLASMA: Lactic Acid, Venous: 1 mmol/L (ref 0.5–2.2)

## 2010-08-03 LAB — BASIC METABOLIC PANEL
BUN: 8 mg/dL (ref 6–23)
BUN: 19 mg/dL (ref 6–23)
BUN: 24 mg/dL — ABNORMAL HIGH (ref 6–23)
CO2: 26 mEq/L (ref 19–32)
CO2: 27 mEq/L (ref 19–32)
CO2: 28 mEq/L (ref 19–32)
Calcium: 8.9 mg/dL (ref 8.4–10.5)
Calcium: 10.6 mg/dL — ABNORMAL HIGH (ref 8.4–10.5)
Calcium: 9.5 mg/dL (ref 8.4–10.5)
Calcium: 9.5 mg/dL (ref 8.4–10.5)
Chloride: 103 mEq/L (ref 96–112)
Chloride: 110 mEq/L (ref 96–112)
Chloride: 111 mEq/L (ref 96–112)
Creatinine, Ser: 0.79 mg/dL (ref 0.4–1.5)
Creatinine, Ser: 0.85 mg/dL (ref 0.4–1.5)
Creatinine, Ser: 1.53 mg/dL — ABNORMAL HIGH (ref 0.4–1.5)
GFR calc Af Amer: >60 mL/min (ref >60)
GFR calc non Af Amer: >60 mL/min (ref >60)
GFR calc non Af Amer: 47 mL/min — ABNORMAL LOW (ref >60)
Glucose, Bld: 165 mg/dL — ABNORMAL HIGH (ref 70–99)
Glucose, Bld: 168 mg/dL — ABNORMAL HIGH (ref 70–99)
Glucose, Bld: 235 mg/dL — ABNORMAL HIGH (ref 70–99)
Potassium: 3.8 mEq/L (ref 3.5–5.1)
Potassium: 4.3 mEq/L (ref 3.5–5.1)
Sodium: 139 mEq/L (ref 135–145)
Sodium: 144 mEq/L (ref 135–145)

## 2010-08-03 LAB — DIFFERENTIAL
Basophils Absolute: 0.2 K/uL — ABNORMAL HIGH (ref 0.0–0.1)
Basophils Relative: 1 % (ref 0–1)
Eosinophils Absolute: 0.6 K/uL (ref 0.0–0.7)
Eosinophils Relative: 5 % (ref 0–5)
Lymphocytes Relative: 24 % (ref 12–46)
Lymphocytes Relative: 23 % (ref 12–46)
Lymphs Abs: 3 K/uL (ref 0.7–4.0)
Lymphs Abs: 2.4 10*3/uL (ref 0.7–4.0)
Monocytes Absolute: 1.3 K/uL — ABNORMAL HIGH (ref 0.1–1.0)
Monocytes Relative: 11 % (ref 3–12)
Monocytes Relative: 7 % (ref 3–12)
Neutro Abs: 7.3 K/uL (ref 1.7–7.7)
Neutrophils Relative %: 59 % (ref 43–77)
Neutrophils Relative %: 66 % (ref 43–77)

## 2010-08-03 LAB — RAPID URINE DRUG SCREEN, HOSP PERFORMED
Amphetamines: NOT DETECTED
Barbiturates: NOT DETECTED
Barbiturates: NOT DETECTED
Benzodiazepines: POSITIVE — AB
Cocaine: NOT DETECTED
Cocaine: NOT DETECTED
Opiates: POSITIVE — AB
Opiates: POSITIVE — AB
Tetrahydrocannabinol: NOT DETECTED

## 2010-08-03 LAB — COMPREHENSIVE METABOLIC PANEL WITH GFR
ALT: 16 U/L (ref 0–53)
AST: 24 U/L (ref 0–37)
Albumin: 4.1 g/dL (ref 3.5–5.2)
Alkaline Phosphatase: 66 U/L (ref 39–117)
BUN: 20 mg/dL (ref 6–23)
CO2: 31 meq/L (ref 19–32)
Calcium: 10.7 mg/dL — ABNORMAL HIGH (ref 8.4–10.5)
Chloride: 94 meq/L — ABNORMAL LOW (ref 96–112)
Creatinine, Ser: 2.32 mg/dL — ABNORMAL HIGH (ref 0.4–1.5)
GFR calc Af Amer: 35 mL/min — ABNORMAL LOW (ref >60)
GFR calc non Af Amer: 29 mL/min — ABNORMAL LOW (ref >60)
Glucose, Bld: 147 mg/dL — ABNORMAL HIGH (ref 70–99)
Potassium: 5.4 meq/L — ABNORMAL HIGH (ref 3.5–5.1)
Sodium: 132 meq/L — ABNORMAL LOW (ref 135–145)
Total Bilirubin: 0.5 mg/dL (ref 0.3–1.2)
Total Protein: 7.9 g/dL (ref 6.0–8.3)

## 2010-08-03 LAB — BLOOD GAS, ARTERIAL
Bicarbonate: 24 mEq/L (ref 20.0–24.0)
Bicarbonate: 24.9 mEq/L — ABNORMAL HIGH (ref 20.0–24.0)
Drawn by: 24610
O2 Content: 2 L/min
O2 Saturation: 94.8 %
Patient temperature: 98.6
Patient temperature: 98.6
pH, Arterial: 7.417 (ref 7.350–7.450)

## 2010-08-03 LAB — COMPREHENSIVE METABOLIC PANEL
ALT: 10 U/L (ref 0–53)
AST: 12 U/L (ref 0–37)
AST: 23 U/L (ref 0–37)
Albumin: 3.2 g/dL — ABNORMAL LOW (ref 3.5–5.2)
Alkaline Phosphatase: 55 U/L (ref 39–117)
Alkaline Phosphatase: 57 U/L (ref 39–117)
BUN: 18 mg/dL (ref 6–23)
CO2: 28 mEq/L (ref 19–32)
CO2: 29 mEq/L (ref 19–32)
Calcium: 9 mg/dL (ref 8.4–10.5)
Chloride: 106 mEq/L (ref 96–112)
Chloride: 98 mEq/L (ref 96–112)
Creatinine, Ser: 1.77 mg/dL — ABNORMAL HIGH (ref 0.4–1.5)
GFR calc Af Amer: 48 mL/min — ABNORMAL LOW (ref >60)
GFR calc Af Amer: >60 mL/min (ref >60)
GFR calc non Af Amer: 40 mL/min — ABNORMAL LOW (ref >60)
GFR calc non Af Amer: >60 mL/min (ref >60)
Potassium: 4 mEq/L (ref 3.5–5.1)
Potassium: 4.4 mEq/L (ref 3.5–5.1)
Sodium: 139 mEq/L (ref 135–145)
Total Bilirubin: 0.3 mg/dL (ref 0.3–1.2)
Total Bilirubin: 0.4 mg/dL (ref 0.3–1.2)

## 2010-08-03 LAB — TROPONIN I: Troponin I: 0.03 ng/mL (ref 0.00–0.06)

## 2010-08-03 LAB — ACETAMINOPHEN LEVEL: Acetaminophen (Tylenol), Serum: <10 ug/mL — ABNORMAL LOW (ref 10–30)

## 2010-08-03 LAB — CK TOTAL AND CKMB (NOT AT ARMC)
CK, MB: 8.2 ng/mL — ABNORMAL HIGH (ref 0.3–4.0)
Total CK: 177 U/L (ref 7–232)

## 2010-08-03 LAB — BRAIN NATRIURETIC PEPTIDE: Pro B Natriuretic peptide (BNP): <30 pg/mL (ref 0.0–100.0)

## 2010-08-03 LAB — POCT CARDIAC MARKERS
CKMB, poc: 8.6 ng/mL (ref 1.0–8.0)
Myoglobin, poc: >500 ng/mL (ref 12–200)
Troponin i, poc: <0.05 ng/mL (ref 0.00–0.09)

## 2010-08-03 LAB — SODIUM, URINE, RANDOM: Sodium, Ur: 56 mEq/L

## 2010-08-03 LAB — PHOSPHORUS: Phosphorus: 3.1 mg/dL (ref 2.3–4.6)

## 2010-08-03 LAB — APTT: aPTT: 20 seconds — ABNORMAL LOW (ref 24–37)

## 2010-08-03 LAB — SALICYLATE LEVEL: Salicylate Lvl: <4 mg/dL (ref 2.8–20.0)

## 2010-08-03 LAB — ETHANOL: Alcohol, Ethyl (B): <5 mg/dL (ref 0–10)

## 2010-08-04 LAB — GLUCOSE, CAPILLARY
Glucose-Capillary: 139 mg/dL — ABNORMAL HIGH (ref 70–99)
Glucose-Capillary: 183 mg/dL — ABNORMAL HIGH (ref 70–99)
Glucose-Capillary: 184 mg/dL — ABNORMAL HIGH (ref 70–99)

## 2010-08-04 LAB — COMPREHENSIVE METABOLIC PANEL
Albumin: 3.7 g/dL (ref 3.5–5.2)
Alkaline Phosphatase: 81 U/L (ref 39–117)
BUN: 18 mg/dL (ref 6–23)
CO2: 34 mEq/L — ABNORMAL HIGH (ref 19–32)
Chloride: 95 mEq/L — ABNORMAL LOW (ref 96–112)
Creatinine, Ser: 1.23 mg/dL (ref 0.4–1.5)
GFR calc non Af Amer: >60 mL/min (ref >60)
Glucose, Bld: 149 mg/dL — ABNORMAL HIGH (ref 70–99)
Potassium: 4.2 mEq/L (ref 3.5–5.1)
Total Bilirubin: 0.6 mg/dL (ref 0.3–1.2)

## 2010-08-04 LAB — CBC
HCT: 37.8 % — ABNORMAL LOW (ref 39.0–52.0)
Hemoglobin: 12.6 g/dL — ABNORMAL LOW (ref 13.0–17.0)
MCHC: 33 g/dL (ref 30.0–36.0)
MCV: 97.3 fL (ref 78.0–100.0)
Platelets: 226 10*3/uL (ref 150–400)
RBC: 3.89 MIL/uL — ABNORMAL LOW (ref 4.22–5.81)
RDW: 14.6 % (ref 11.5–15.5)
WBC: 7.5 10*3/uL (ref 4.0–10.5)

## 2010-08-04 LAB — BASIC METABOLIC PANEL
BUN: 7 mg/dL (ref 6–23)
CO2: 32 mEq/L (ref 19–32)
Calcium: 9.5 mg/dL (ref 8.4–10.5)
Creatinine, Ser: 0.83 mg/dL (ref 0.4–1.5)
GFR calc Af Amer: >60 mL/min (ref >60)

## 2010-08-04 LAB — RAPID URINE DRUG SCREEN, HOSP PERFORMED
Amphetamines: NOT DETECTED
Benzodiazepines: POSITIVE — AB

## 2010-08-04 LAB — DIFFERENTIAL
Basophils Absolute: 0 10*3/uL (ref 0.0–0.1)
Basophils Relative: 1 % (ref 0–1)
Lymphocytes Relative: 22 % (ref 12–46)
Monocytes Absolute: 0.9 10*3/uL (ref 0.1–1.0)
Neutro Abs: 4.6 10*3/uL (ref 1.7–7.7)
Neutrophils Relative %: 61 % (ref 43–77)

## 2010-08-04 LAB — POCT I-STAT 3, ART BLOOD GAS (G3+)
Acid-Base Excess: 6 mmol/L — ABNORMAL HIGH (ref 0.0–2.0)
Bicarbonate: 33.4 mEq/L — ABNORMAL HIGH (ref 20.0–24.0)
Bicarbonate: 34.3 mEq/L — ABNORMAL HIGH (ref 20.0–24.0)
O2 Saturation: 65 %
Patient temperature: 98.6
TCO2: 36 mmol/L (ref 0–100)
pH, Arterial: 7.322 — ABNORMAL LOW (ref 7.350–7.450)
pO2, Arterial: 75 mmHg — ABNORMAL LOW (ref 80.0–100.0)

## 2010-08-04 LAB — LIPASE, BLOOD: Lipase: 19 U/L (ref 11–59)

## 2010-08-04 LAB — URINALYSIS, ROUTINE W REFLEX MICROSCOPIC
Nitrite: POSITIVE — AB
Specific Gravity, Urine: 1.01 (ref 1.005–1.030)
Urobilinogen, UA: 0.2 mg/dL (ref 0.0–1.0)

## 2010-08-04 LAB — POCT CARDIAC MARKERS

## 2010-08-04 LAB — URINE MICROSCOPIC-ADD ON

## 2010-08-04 LAB — BLOOD GAS, ARTERIAL
Acid-Base Excess: 7.1 mmol/L — ABNORMAL HIGH (ref 0.0–2.0)
Patient temperature: 97.9
TCO2: 34.6 mmol/L (ref 0–100)
pCO2 arterial: 60.2 mmHg (ref 35.0–45.0)

## 2010-08-04 LAB — ETHANOL: Alcohol, Ethyl (B): <5 mg/dL (ref 0–10)

## 2010-08-04 LAB — CK TOTAL AND CKMB (NOT AT ARMC)
CK, MB: 5.3 ng/mL — ABNORMAL HIGH (ref 0.3–4.0)
Total CK: 372 U/L — ABNORMAL HIGH (ref 7–232)

## 2010-09-11 NOTE — Assessment & Plan Note (Signed)
[name] returns to the clinic today for followup evaluation.  He  continues to report decent relief using his oxycodone, used 15 mg q.i.d.  He reports that his blood sugars have been in the 120-170 range.  He  does report some insomnia the past 2 to 3 weeks but only the past 2 to 3  weeks.  Before that he had been seen sleeping well.  He stopped Ambien  secondary to ineffectiveness.  He has tried over-the-counter Benadryl  and chamomile tea.  He tries to be as active as possible.   MEDICATIONS:  1. Metformin 1000 mg b.i.d.  2. Glipizide 10 mg b.i.d.  3. Lisinopril 40 mg daily.  4. Carafate 1 g q.i.d.  5. Oxycodone 15 mg, one tablet q.i.d. p.r.n.  6. Omeprazole 150 mg daily.  7. Ranitidine 75 mg b.i.d.  8. Cholesterol medication at bedtime.  9. Ambien p.r.n.   REVIEW OF SYSTEMS:  Noncontributory.   PHYSICAL EXAM:  A well-appearing large adult male in mild acute  discomfort.  Blood pressure 147/64 with a pulse of 62, respiratory rate  18 and O2 saturation 93% on room air.  He has 4+/5 strength throughout  the bilateral upper and lower extremities.   IMPRESSION:  History of chronic low back pain secondary to degenerative  disk disease/spondylosis/stenosis.   In the office today no refill of medication is necessary.  The patient  wishes to wait at least for the time being to see if his insomnia  resolves, as it has only been present for a couple weeks.  We will plan  on seeing the patient in followup in approximately 2 to 3 months' time  with refills prior to that appointment as necessary.  The patient had  his last refill July 07, 2007.           ______________________________  Ellwood Dense, M.D.     DC/MedQ  D:  07/13/2007 14:48:31  T:  07/13/2007 17:27:10  Job #:  [id number]

## 2010-09-11 NOTE — Discharge Summary (Signed)
NAMEADONAY, Ryan [name]               ACCOUNT NO.:  [id number]   MEDICAL RECORD NO.:  [id number]          PATIENT TYPE:  INP   LOCATION:  3314                         FACILITY:  [facility]   PHYSICIAN:  Pearlean Brownie, M.D.DATE OF BIRTH:  10-22-1950   DATE OF ADMISSION:  12/18/2008  DATE OF DISCHARGE:  12/19/2008                               DISCHARGE SUMMARY   CONSULTS:  None.   DISCHARGE DIAGNOSIS:  Acute mental status change secondary to drug  overdose.   DISCHARGE MEDICATIONS:  1. Colace 100 mg b.i.d.  2. Lisinopril 40 mg p.o. daily.  3. Plavix 75 mg p.o. daily.  4. Simvastatin 80 mg half tablet p.o. b.i.d.  5. Ranitidine 300 mg p.o. b.i.d.  6. Glipizide 10 mg 2 tablets p.o. b.i.d.  7. Metoprolol 50 mg p.o. b.i.d.  8. Multivitamin with minerals 1 tablet p.o. daily.  9. Omeprazole 20 mg 2 capsules p.o. daily.  10.Metformin 1000 mg p.o. b.i.d.  11.Gabapentin 400 mg p.o. t.i.d.  12.Sertraline 100 mg p.o. daily.  13.Zofran 4 mg p.o. q.6 h. nausea.  14.Alprazolam 1 mg p.o. b.i.d. as needed.  15.Phenergan 25 mg p.o. q.8 h. p.r.n.  16.Tylenol 325 mg 2 tablets p.o. daily.  17.Aspirin 325 mg p.o. daily.  18.Sucralfate PCM p.o. 4 times a day.   PROCEDURES:  1. Chest x-ray.  X-ray of the chest on December 18, 2008, showed no      change in aeration of lungs compared to prior exam.  2. CT of the head without contrast on December 22, 2008, showed no acute      intracranial abnormalities.  Small vessel ischemic disease.      Subacute-to-chronic left basal ganglia lacunar infarct.   LABORATORY DATA:  On admission, CBC within normal limits.  Point-of-  cardiac markers within normal limits.  Ammonia within normal limits.  CMP within normal limits except for chloride decreased to 95, bicarb  increased to 34, glucose increased to 149.  Alcohol level not detected.  Lipase level normal.  Blood gas showed pH 7.322, PCO2 66.2, PO2 38,  bicarb 34.3, acid-base excess of 6.  A followup blood gas  obtained an  hour later showed pH of the blood 7.31, PCO2 61.7 PO2 75, bicarb 33.4,  base excess 6.  Urine drug screen positive for benzodiazepines and  opiates.  Urinalysis positive for nitrites, moderate leukocyte esterase,  many bacteria, few epithelial cells.  BNP 61.  Hemoglobin HbA1c 9.4.   BRIEF HOSPITAL COURSE:  This patient is a 60 year old male who came to  the emergency department status post fall while at home but was admitted  to the hospital due to increased somnolence.   PROBLEMS:  1. Somnolence most likely secondary to acute drug overdose.  The      patient takes 30 mg oxycodone at home 4 times a day for pain in his      back.  The patient has had chronic back pain for years.  Somnolence      could have been secondary to CO2 retention, as the patient is a      known COPD patient, and  pH blood gas was consistent with possible      CO2 retention.  The patient was placed on BiPAP while in the      emergency department.  Somnolence did not improve while on BiPAP,      although later arterial blood gas values improved.  The patient was      kept n.p.o. due to worries for aspiration.  The patient was placed      on a small amount of opiate to prevent withdrawal.  Trial Narcan      was considered, did not undergone as the patient's oxygen      saturations remained within normal limits throughout the patient's      hospital stay.  On the morning after the patient's hospital      admission, the patient became much more alert and awake.  The      patient's clinical condition continued to improve while in      hospital.  As the patient became more awake, he began to ask for      more and more pain medication.  The patient was continued on only      15 mg oxycodone with 5 mg for breakthrough pain, I am worried with      that 30 mg was too much for the patient and would cause him to      become somnolent again.  On going back through the patient's      records, he has a history of  being seen at the [facility] for his      pain.  Also history of being placed on p.o. Dilaudid 4 mg upon      previous discharges from the hospital. I also spoke to the      patient's wife over the telephone at the patient's wife request      without the patient being present.  Wife is very concerned that the      patient has been engaging drug-seeking behavior, seeking opiate      prescriptions from multiple physicians.  She states that her      husband has told her she not to tell anyone about this because it      will get the physicians in trouble.  I recommended that she bring      this up with her husband's primary care physician who has been      prescribing pain medications to the patient and to make him aware      of the fact that the patient may be indeed obtaining opiate      medications from other prescribers as well.  The patient was      discharged home from the hospital on no pain medications.  2. Hypertension.  The patient was kept on his home dose medications      for high blood pressure.  The patient discharged on same dose.  The      patient's blood pressures remained good while in-house.  3. Coronary artery disease status post stent placement in 2009.  The      patient was kept on Plavix and aspirin while in-house.  4. Hyperlipidemia.  The patient remained on home dose of simvastatin.  5. Diabetes mellitus.  The patient remained on home dose of glipizide      10 mg.  The patient's CBGs from 144-183 while in-house.  The      patient placed on sensitive sliding scale due to n.p.o. status.  The patient also kept on home dose of metformin 1000 mg p.o. t.i.d.   DISCHARGE INSTRUCTIONS:  The patient was discharged to home with  instructions to call his PCP or return to the emergency department if he  has any more episodes of somnolence, chest pain, difficulty breathing,  other concerns.   FOLLOWUP:  The patient was instructed to call primary care physician,  Dr. Cleta Alberts  at [facility] to schedule a followup appointment.   DISCHARGE CONDITION:  The patient was discharged to home in good medical  condition.      Renold Don, MD  Electronically Signed      Pearlean Brownie, M.D.  Electronically Signed    JW/MEDQ  D:  12/20/2008  T:  12/21/2008  Job:  [id number]

## 2010-09-11 NOTE — H&P (Signed)
Ryan [name], Ryan [name]               ACCOUNT NO.:  [id number]   MEDICAL RECORD NO.:  [id number]          PATIENT TYPE:  INP   LOCATION:  3314                         FACILITY:  [facility]   PHYSICIAN:  Pearlean Brownie, M.D.DATE OF BIRTH:  04-Oct-1950   DATE OF ADMISSION:  12/18/2008  DATE OF DISCHARGE:  12/19/2008                              HISTORY & PHYSICAL   REASON FOR ADMISSION:  Somnolence.   PRIMARY CARE PHYSICIAN:  Brett Canales A. Cleta Alberts, MD, [facility].   HISTORY OF PRESENT ILLNESS:  This is a 60 year old morbidly obese male  who presented to the [facility] after a fall at home  this morning.  A fall workup was negative in the emergency room with the  patient was very somnolent, this the ER physician is requesting  admission.  Per the wife, the patient has been very sleepy for some time  and falling at home regularly.  At baseline, he has significant  difficulty walking secondary to chronic low back pain, for which he  takes a significant amount of narcotics.   PAST MEDICAL HISTORY:  1. Chronic low back pain, on large amount of narcotics with some      concern for abuse.  2. Tobacco abuse.  3. Type 2 Diabetes.  4. Hypertension.  5. Coronary artery disease, status post stent.  6. Morbid obesity.  7. History of pancreatitis.  8. Hyperlipidemia.  9. History of cholelithiasis.   PAST SURGICAL HISTORY:  Cardiac stent x2 and cholecystectomy.   SOCIAL HISTORY:  The patient is stable and is married.  He does use  tobacco approximately half-a-pack to one pack per day.  He denies any  alcohol use.  He has one son.  He also had a living well and is  reportedly full code.  He also denies any drug use.   FAMILY HISTORY:  Per his wife, his father was deceased at young age of  unknown causes and the mother has an unknown number of illness.   MEDICATIONS:  The wife does not know them and the patient does not awake  enough to tell me at this time,  though she does report that he is on a  large amount of narcotics.  I have asked her to bring these medications  and this is currently pending.   ALLERGIES:  No known drug allergies.   REVIEW OF SYSTEMS:  The wife states that he has been complaining of some  urinary difficulties, but otherwise the patient will not wake up at this  time for review of systems.   PHYSICAL EXAMINATION:  VITAL SIGNS:  Temperature 97.3, pulse 72,  respirations 18, O2 sat 91% on room air, blood pressure 141/80.  GENERAL:  This is somewhat morbidly obese male in no apparent distress  with a BiPAP currently in place.  HEENT:  Normocephalic, atraumatic with pinpoint, but reactive pupils.  Again, BiPAP was in place slightly in breathing exam.  CV:  Regular rate and rhythm.  No murmurs, but distant sounds.  PULMONARY:  Again distant sounds with questionable crackles in the left  lower  lung base and normal work of breathing.  ABDOMEN:  Soft, nontender, but distended.  There are scars from his  previous laparoscopic cholecystectomy noted.  Due to his morbid obesity,  I do not appreciate any masses.  EXTREMITIES:  2+ pitting edema with warmth and redness at the bilateral  shins and 1+ distal pulses, but cool feet.  NEURO:  The patient is somnolent and occasionally well mumbled, but he  is moving all extremities at some point in time.  SKIN:  Please see extremity exam above.  He does also have abrasions to  his left shoulder and his elbow from the fall this morning.   LABORATORY DATA:  ABG after being on BiPAP is 7.341, pCO2 61.7, PO2 75,  bicarb 33.4.  Lipase is 19, alcohol level less than 5, ammonia level 29.  CMET was within normal limits.  Point-of-care cardiac markers revealed  CK-MB 2.7, troponin I less than 0.05, myoglobin greater than 500.  CBC  was also within normal limits with a normal differential.   STUDIES:  CT of the head shows chronic small vessel disease.  No  subacute-to-chronic left basal  ganglia infarct with no other acute  findings.  Chest x-ray is unchanged from prior, which showed a patchy  bilateral ear space densities.  EKG reveals a primary AV block and  normal sinus rhythm.   ASSESSMENT:  This is a 60 year old morbidly obese male with somnolence,  chronic low back pain with having narcotic use. type 2 diabetes,  hypertension, coronary artery disease, hyperlipidemia, and history of  pancreatitis.   PLAN:  1. Somnolence, it is likely multifactorial.  Of note, his ammonia is      okay and his alcohol level was negative.  I suspect likely that his      somnolence is related to carbon dioxide retention, likely secondary      to obstructive sleep apnea and obesity hypoventilation syndrome.      This is certainly probably be worsened by his narcotic use.  For      now, we will continue his BiPAP to help treat this and consider a      trial of Narcan.  Certainly to finish out his workup for      somnolence, we should check a urine drug screen to look for any      other medications that he may have taken that could be      contributing.  We should also monitor his cardiac status with      telemetry and follow cardiac enzymes to make sure there is no      cardiac cause particularly in light of his elevated myoglobin right      now and recent fall.  We should also rule out any other infectious      cause though this seems unlikely, most of his workup has already      been done, but he has not had a UA, so we will obtain this.  Until      he is more awake, we will keep the patient n.p.o.  2. Back pain, question excessive narcotic use with the possible      contributor to somnolence.  We will continue to monitor closely and      evaluate his home medication list.  3. Type 2 diabetes.  We will provide a moderate sliding scale insulin      and check his A1c for compliance.  4. Hypertension.  We will provide p.r.n. hydralazine until  we know      more about his medications and  the patient is more awake to take      his home medications.  5. Coronary artery disease.  He should be on aspirin 81 mg p.o. daily.  6. Hyperlipidemia.  His last lipid panel in November 2009 revealed a      cholesterol 214, triglycerides 141, HDL 41, and LDL 145.  Likely,      we would benefit from a statin that I will hold until we have      further check if cardiac enzymes given his elevated myoglobin at      this time.  7. Question cellulitis to the legs versus venous stasis.  The patient      certainly fluid overloaded and to help evaluate this further, we      will check a BMP.  We will monitor him closely with elevation of      his legs to see if this improves and consider antibiotics if it      does not.  8. FEN/GI.  Again, his lytes are stable at this time and we will keep      the patient n.p.o. until more awake.  Because he seems fluid      overload at this time, we will not provide any IV fluids.  9. Prophylaxis, heparin subcu t.i.d.  10.Code status full.  11.Disposition is pending workup and decrease in his somnolence.      Ancil Boozer, MD  Electronically Signed      Pearlean Brownie, M.D.  Electronically Signed    SA/MEDQ  D:  12/20/2008  T:  12/20/2008  Job:  [id number]

## 2010-09-11 NOTE — Assessment & Plan Note (Signed)
OFFICE VISIT   Giovanelli, Gareth A  DOB:  [birth year]                                       12/14/2009  GUYQI#:[id number]   CHIEF COMPLAINT:  Leg pain.   HISTORY OF PRESENT ILLNESS:  The patient is a 60 year old male with  complaint of bilateral lower extremity pain.  He apparently was  evaluated in the emergency room recently for this and had a CT angiogram  of the chest, abdomen and pelvis which was essentially normal at that  time.  It was thought that he may have peripheral arterial disease as a  component to his leg pain and he is referred today for further  evaluation.  The patient states that he has pain in the front and back  of his legs that has been present for more than a month.  He also  occasionally has some cramps in his legs at night time.  Pain he states  is usually worse when he is lying down.  He has a history of neuropathy  in both legs.  His right foot always feels cool and has felt like this  for several years.  He did have previous spine operation in 2010 by Dr.  Jeral Fruit.  He states his back pain improved significantly.  He does not  really remember whether or not he has had any problems with his legs  prior to or after the back operation.  He is a smoker and continues to  smoke a half pack cigarettes per day.   CHRONIC MEDICAL PROBLEMS:  Also include diabetes, hypertension, coronary  artery disease.  These are all currently controlled.  He previously had  seen Dr. Darrall Dears for these problems, but is changing to a different  primary care doctor at this point.   He denies any rest pain.  He has not had any history of nonhealing  wounds on the feet.   SOCIAL HISTORY:  He is disabled.  He is married, he has one child.  Smoking history is as listed above.  He does not consume alcohol  regularly.   FAMILY HISTORY:  Unremarkable.   Full 12-point review of systems was performed with the patient today.  Please see intake referral form for  details regarding this.   MEDICATIONS:  1. Metformin 850 t.i.d.  2. Glipizide 10 mg twice a day.  3. Losartan 100 mg once a day.  4. Aspirin 81 mg once a day.  5. Ranitidine 300 mg twice a day.  6. Hydrochlorothiazide 20 mg twice a day.  7. Xanax 1 mg twice a day.  8. Oxycodone 30 mg 5 tablets daily, previously given by Dr. Darrall Dears      in [place], [place].  9. Multivitamin.  10.Stool softener.  11.Carafate.  12.Gabapentin 400 mg t.i.d.   ALLERGIES:  THE PATIENT HAS ALLERGIES LISTED TO MORPHINE, SEROQUEL AND  LISINOPRIL.   PHYSICAL EXAM:  Today blood pressure 125/84, temperature is 97.4, heart  rate 91 and regular.  HEENT:  Unremarkable.  Neck:  Has 2+ carotid  pulses without bruit.  Chest:  Clear to auscultation.  Cardiac exam is  regular rate without murmur.  Abdomen:  Soft, nontender, nondistended,  obese, no masses.  Extremities:  He has 2+ femoral pulses bilaterally,  he has 2+ left dorsalis pedis pulse.  He has absent pedal pulses on the  right  foot.  He has no open ulcers or wounds on the foot, he does have  some hair loss on the legs bilaterally.  Musculoskeletal:  Exam shows no  obvious major joint deformities.  Neurologic exam shows symmetric upper  extremity and lower extremity motor strength which is 5/5.  He does have  some slight decreased sensation to light touch on the plantar aspect of  both feet.  This apparently is normal sensation on the dorsal aspect  though.  Skin has no open ulcers or rashes.   He had bilateral ABIs performed today which were 1.04 on the right and  0.88 on the left.   SUMMARY:  Mr. 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  He does have some atherosclerotic risk factors, but I do not  believe that PAD is the cause for his lower extremity pain.  The ABIs  would have to be significantly lower to explain his symptoms.  I did  discuss with the  patient today that I think he should have an additional  evaluation by Dr. Jeral Fruit to see if any of his leg pain can possibly be  related to his back.  We have scheduled him a follow-up appointment for  Dr. Jeral Fruit in the near future.  He was given a prescription today for  oxycodone 10 mg tablets #30 dispensed, we will not give any further  refills as I do not believe this is related to vascular disease, but  hopefully this will be enough pain medicine to last him until he sees  Dr. Jeral Fruit.  If his spine workup is negative he may benefit from a pain  management consult.  He will follow up with [place] on an as-needed basis.     Janetta Hora. Fields, MD  Electronically Signed   CEF/MEDQ  D:  12/14/2009  T:  12/15/2009  Job:  [id number]   cc:   Hilda Lias, M.D.

## 2010-09-11 NOTE — Assessment & Plan Note (Signed)
Ryan [name] returns to clinic today for followup evaluation.  He was  recently hospitalized for failure of the non-medicated cardiac stent  that he had placed.  In place of that stent, he had a medicated stent  placed and was placed on aspirin and Plavix therapy.  He also reports  that they made adjustments to his diabetes medicines, but he is back now  on his metformin and his glipizide.  They also added hydrochlorothiazide  and stopped his omeprazole, but continued his Carafate and his  ranitidine.   The patient reports that he does get relief from the oxycodone 15 mg  used approximately 4 times per day.  He has an external defibrillator in  place that they are planning to keep for 2-3 months to try to monitor  his heart.   MEDICATIONS:  1. Metformin 1000 mg b.i.d.  2. Glipizide 10 mg.  3. Lisinopril 40 mg.  4. Oxycodone 15 mg 1 tablet q.i.d. p.r.n.  5. Carafate 1 g q.i.d.  6. Ranitidine 75 mg b.i.d.  7. Crestor medication every night.  8. Ambien p.r.n.  9. Aspirin 325 mg.  10.Plavix 75 mg.  11.Hydrochlorothiazide 50 mg q. day.   REVIEW OF SYSTEMS:  Positive for poor appetite.   PHYSICAL EXAMINATION:  Well-appearing large adult male in mild acute  discomfort.  Blood pressure 127/81 with pulse of 61, respiratory rate 14, and O2  saturation 95% on room air.  He has 4+/5 strength throughout.  He ambulates without any assistive  device.   IMPRESSION:  1. History of chronic low back pain secondary to degenerative disk      disease/spondylosis/stenosis.  2. Coronary artery disease.  3. Gastroesophageal reflux disease.  4. Hypertension.   In the office today, we did refill the patient's oxycodone as of October 08, 2007.  We also gave  him a script for his wife, so that she take  some time off work to help care for him at home.  We will plan on seeing  the patient in followup in this office in approximately 3 months' time  with refills prior to that appointment as  necessary.           ______________________________  Ellwood Dense, M.D.     DC/MedQ  D:  10/05/2007 13:34:56  T:  10/06/2007 16:10:96  Job #:  [id number]

## 2010-09-11 NOTE — Group Therapy Note (Signed)
NEW OFFICE NOTE   PURPOSE OF EVALUATION:  Evaluate and treat chronic lumbar pain.   REFERRAL:  Dr. Daphine Deutscher (primary care physician).   HISTORY OF PRESENT ILLNESS:  Ryan [name] is a 60 year old, obese, adult  male, referred to this office by Dr. Daphine Deutscher for evaluation and treatment  of chronic pain.   It appears that the patient has had a longstanding history of lumbar  pain requiring an MRI scan of his lumbar spine, July 20, 2001.  This  showed posterior disk bulges resulting in mild spinal stenosis and mild  bilateral foraminal stenosis at L4-5 disk level, and mild to moderate  bilateral foraminal stenosis at L5-S1 disk level.  There was mild  posterior disk bulge at L3-4 without significant spinal or foraminal  stenosis.   The patient reports that since approximately 2005 he received Percocet  through the [facility], and then more recently through Dr. Daphine Deutscher,  his primary care physician for the past one to two years.  He reports  that he has been using the 5/325 approximately five tablets per day and  getting only minimal benefit.  He is tolerating the medication without  significant side effects.   The patient was recently hospitalized in [place] for approximately one  month with pancreatitis secondary to gallbladder disease.  He was  treated with Dilaudid along with Xanax and Carafate.  He subsequently  underwent a stress test as they were worried about his heart condition  with an irregularity noted on EKG.  A cardiac catheterization showed  blockage, and he had a stent placed.  He is recovering from the cardiac  catheterization and plans to have a future cholecystectomy once he  recovers sufficiently.   The patient reports low back pain being persistent and causing him  significant pain for months to years.  He reports that he had gained  better relief with the Percocet when he started using it, but notices  that the benefit has been less significant over the past several  months.   PAST MEDICAL HISTORY:  1. Obesity.  2. Non-insulin-dependent diabetes mellitus.  3. Chronic low back pain.  4. Tobacco abuse.  5. Dyslipidemia.  6. Cholelithiasis.  7. Hypertension.   ALLERGIES:  No known drug allergies.   SOCIAL HISTORY:  The patient is married and lives with his wife.  He is  on disability.  He smokes one-half pack of cigarettes per day and denies  alcohol.  He has three grown children.   REVIEW OF SYSTEMS:  High blood sugar.   MEDICATIONS:  1. Metformin 1000 mg b.i.d.  2. Glipizide 10 mg b.i.d.  3. Lisinopril 40 mg daily.  4. Percocet 5/325 one tablet 5 times per day p.r.n.  5. Carafate 1 q.i.d.  6. Plavix 75 mg daily.  7. Omeprazole 150 mg daily.  8. Ranitidine 75 mg b.i.d.  9. Cholesterol medication q.h.s.  10.Ambien 10 mg p.o. q.h.s.   PHYSICAL EXAMINATION:  GENERAL:  A reasonably well-appearing, large,  adult male.  VITAL SIGNS:  Height 6 foot 5 inches, weight 317 pounds, blood pressure  153/69 with a pulse of 86, respiratory rate 18, and O2 saturation 95% on  room air.  PAIN AND REHAB EVALUATION:  He ambulates with a single-point cane and  reports that he can walk 15 to 20 minutes before he needs to rest.  He  has decreased lumbar extension and flexion in the standing position.  Upper extremity range of motion was full and pain free.  Upper extremity  strength was 5/5.  Sensation was intact to light touch throughout.  Lower extremity strength was 5-/5 in hip flexion, knee extension, and  ankle dorsiflexion.  Sensation was intact to light touch throughout the  bilateral lower extremities, and reflexes were 1+ to the bilateral knees  and ankles.  In the supine position, straight leg raise was positive  bilaterally at 30 degrees, and hip range of motion was within functional  limits.   IMPRESSION:  History of chronic low back pain secondary to degenerative  disk disease/spondylosis/stenosis.   At the present time, we have decided to  switch him to Oxycodone 15 mg  one tablet q.i.d. p.r.n. in place of his Percocet.  We will avoid the  use of the Tylenol and also allowing increased dose since he has brought  up some tolerance to the 5 mg strength.  We will plan on seeing him in  followup in approximately one to two months time, with refills prior to  that appointment as necessary.  He understands that he needs to be  compliant with our instructions and receive all pain medicines through  this office only.           ______________________________  Ellwood Dense, M.D.     DC/MedQ  D:  11/27/2006 11:57:45  T:  11/27/2006 21:00:20  Job #:  [id number]

## 2010-09-11 NOTE — Assessment & Plan Note (Signed)
HISTORY:  Date of last visit was October 05, 2007.  He is a 60 year old male  with degenerative spondylosis of lumbar spine with chronic low back  pain.  His last MRI was in 2003 showing L4-5 disk degeneration, mild-to-  moderate bilateral foraminal stenosis L5-S1, mild posterior disk bulge  L3-4 without significant spinal stenosis at that level.  He was tried on  facet injections at L4-5 levels in the past, which the patient states  were not particularly helpful.  His other past medical history  significant for diabetes, hypertension, coronary artery disease status  post stenting, and  pancreatitis.  He denies any history of alcoholism.  He has been at the [place] in [place].  He thinks he might have back MRI at  that time.  Most recently, he was hospitalized at [facility] for  pancreatitis.  I did read through the discharge report indicating that  he is requesting inordinate amounts of pain medicine using Dilaudid as  well as MS Contin and was requesting to be put on MS Contin on discharge  to home, but his physicians knew he was under pain contract with this  clinic.  The patient in stead has told me that he is allergic to MS  Contin and was taken off of this in the hospital.   His average pain is in the 6-7/10 range with good relief from  medications.  His pain is worse with walking, bending, standing,  interferes with enjoyment of life at 5/10 and general activity at 4/10  level.  His functional status is independent with the exception of  needing some assistance with certain household duties.   REVIEW OF SYSTEMS:  Positive trouble walking and spasms.   SOCIAL HISTORY:  He lives with his wife, smokes half a pack a day.  Denies any alcohol abuse or illegal drug use.   MEDICATIONS:  His current pain medications are oxycodone 50 mg p.o.  q.i.d.  He has not had any urinary drug screen since December 25, 2006 as  well as December 05, 2006 which showed hydromorphone but not hydrocodone.  At that  time, he was taking both hydromorphone and oxycodone.  He was  supposed to be taking alprazolam, but this did not show up at that time.  He is not being prescribed alprazolam through this clinic.  His last  prescription was written on June 02, 2008 by Dr. Thomasena Edis.  He has  none left, but states he took one this morning on a pill count.   PHYSICAL EXAMINATION:  An obese male in no acute distress.  Blood  pressure 136/74, pulse 100, respirations 18, O2 sat 93% room air.  In  general, in no acute distress.  Extremities show no evidence edema.  His  strength is normal in the upper and lower extremities.  His deep tendon  reflexes are reduced in bilateral knees and ankles.  His back has some  tenderness to palpation in the lumbar paraspinals.  He has intact  sensation in his lower extremities.   IMPRESSION:  1. Lumbar spinal stenosis, believe that his thigh pain is radicular.      He sounds like he has some intermittent claudication type symptoms      likely neurogenic.  He states that he has had a MRI in [place], I      have asked him to bring that in.  He did not have an MRI during the      [facility] admission.  2. Narcotic analgesic monitoring.  We will repeat UDS today.  He has      some inconsistent reports of his medical care at [facility] at      least this does not appear to drive with the discharge summary that      I read.  In any event, we will give him a 10-day supply his      oxycodone at current dose.  If anything checks out with that, I      will be able to continue his narcotic analgesics, but likely switch      him to a long lasting agent such as MS Contin.   Discussed with the patient, he is in agreement with this plan.      Erick Colace, M.D.  Electronically Signed     AEK/MedQ  D:  07/01/2008 16:44:19  T:  07/02/2008 10:32:35  Job #:  [id number]

## 2010-09-11 NOTE — Assessment & Plan Note (Signed)
Ryan [name] returns to clinic today for followup evaluation. I first and  last saw the patient in this office November 27, 2006 for evaluation and  treatment of chronic lumbar pain. He has not been back since that time  despite scheduled appointments.   The patient comes in today saying that he was hospitalized for at least  two months since last clinic visit. He reports that he had a gallbladder  removed in September and then was hospitalized for severe pancreatic  attack in November 2008. He had been using the oxycodone 15 mg probably  four times a day for his chronic pain and that has been helping. He  would like to continue on that medication, but has no further supply at  the present time. The prescription that we gave him from September 2008,  lasted until now secondary to the two months he spent in the hospital.   CURRENT MEDICATIONS:  1. Metformin 1000 mg b.i.d.  2. Glipizide 10 mg b.i.d.  3. Lisinopril 40 mg daily.  4. Carafate 1 gram q.i.d.  5. Plavix 75 mg daily.  6. Oxycodone 15 mg one tablet q.i.d.  7. Omeprazole 150 mg daily.  8. Ranitidine 75 mg b.i.d.  9. Cholesterol medication q.h.s.  10.Ambien p.r.n.   REVIEW OF SYSTEMS:  Noncontributory.   PHYSICAL EXAMINATION:  Well-appearing large adult male in mild to  moderate acute discomfort. Blood pressure 121/74, pulse of 66,  respiratory rate is 18 and O2 saturation is 95% on room air. He reports  that his blood sugars have been in the 120 to 140 range on medications.  He ambulates with single point cane with an antalgic gait. Strength was  generally 4+/5 throughout the bilateral upper and lower extremities.   IMPRESSION:  History of chronic low back pain secondary to degenerative  disc disease/spondylosis/stenosis.   In the office today, we did refill the patient's oxycodone to be used 15  mg one tablet q.i.d. p.r.n. Will plan on seeing him in followup in  approximately 3-4 months time with refills prior to that  appointment if  necessary. I have encouraged him to keep his appointments so that we can  manage his medication and keep him well-controlled.           ______________________________  Ryan [name], M.D.     DC/MedQ  D:  04/09/2007 11:58:34  T:  04/09/2007 18:56:41  Job #:  [id number]

## 2010-09-14 NOTE — Discharge Summary (Signed)
Ryan [name], Ryan [name]               ACCOUNT NO.:  [id number]   MEDICAL RECORD NO.:  [id number]          PATIENT TYPE:  INP   LOCATION:  1308                         FACILITY:  [facility]   PHYSICIAN:  Tanya D. Daphine Deutscher, M.D.  DATE OF BIRTH:  Sep 19, 1950   DATE OF ADMISSION:  03/19/2008  DATE OF DISCHARGE:  03/24/2008                               DISCHARGE SUMMARY   PRIMARY DIAGNOSIS:  Acute pancreatitis.   SECONDARY DIAGNOSES:  1. Obesity.  2. Diabetes mellitus type.  3. Hypertension.  4. Tobacco use.   PRIMARY PROCEDURES:  IV fluids.   CONSULTATIONS:  None.   HOSPITAL COURSE:  Briefly, this is a 60 year old African American male  who is well known to my panels of patients who presented to the  emergency department complaining of severe abdominal pain and vomiting.  The patient does have a history of pancreatitis in the past and stated  that his current symptoms were consistent with such.  He, in the  emergency department, was found to have a normal urinalysis and a lipase  level of 285.  Based on those findings and  suggestive of mild  inflammation surrounding the pancreas, he was admitted.  He was placed  on IV fluids and made n.p.o.  The patient continues to complain of  constant pain as he does have a longstanding history of narcotic pain  medications on day #2 for which he sees a pain management specialist.  He continues to complain that the Dilaudid and Phenergan were not  significant to control his pain and when he goes to [place], they usually  give morphine IV.  The patient continues to do well.  His amylase  continues to decline as well as his lipase.  On hospital day #2, the  patient was upset in that the Dilaudid interval was lengthened  and the  amount of the dose was increased.  He was not happy with the fact that  he could not repeat his Dilaudid every 2 hours although he is feeding  and there has been no vomiting.  He did have regular bowel movements  throughout his  hospital stay.  On hospital day #3, he was dissatisfied  with the amount of frequency of pain medication ordered and states that  his pain is currently 10/10 and he is requesting a fentanyl patch and/or  morphine.  He continues to eat, no vomiting.  No nausea.  No significant  changes on his exam.  Laboratory values continued to decline toward the  normal range.  On hospital day #4, the patient requested to go home,  stating that he could  remain in pain while at home, but did request a  copy of his file in that he was not receiving Dilaudid but rather a  placebo medication and his pain was a 4-5/10.  His baseline was usually  2-3/10 and IV Dilaudid q.6 was given to the patient.  I did understand  that the patient has a very low pain threshold, he actually tolerates  large amounts of pain medicines.  He states that he requires 7 to 8 mg  of Dilaudid q.2-3 h. to improve his pain in addition to MS Contin.  I  explained to the patient that because he is currently under the care of  a pain management physician, that I will not send him home  with MS  Contin but with his regular medications and should there be a change,  may be he could discuss that with Pain Management.  In terms of the  pancreatitis, the amylase and lipase were 93 and 87 respectively on day  of discharge.  I advised the patient to stay an additional day, he  refused.  Therefore, he was discharged to home in improved condition.   DISCHARGE MEDICATIONS:  Dilaudid p.o. 4 mg, #90 with no refills as he  was to follow up with myself on the following Monday.           ______________________________  Cephas Darby. Daphine Deutscher, M.D.     TDM/MEDQ  D:  05/17/2008  T:  05/18/2008  Job:  [id number]

## 2010-10-20 ENCOUNTER — Emergency Department (HOSPITAL_COMMUNITY): Payer: Medicare Other

## 2010-10-20 ENCOUNTER — Emergency Department (HOSPITAL_COMMUNITY)
Admission: EM | Admit: 2010-10-20 | Discharge: 2010-10-20 | Disposition: A | Discharge status: Home / Self Care | Payer: Medicare Other | Attending: Emergency Medicine | Admitting: Emergency Medicine

## 2010-10-20 DIAGNOSIS — G8929 Other chronic pain: Secondary | ICD-10-CM | POA: Insufficient documentation

## 2010-10-20 DIAGNOSIS — Z8639 Personal history of other endocrine, nutritional and metabolic disease: Secondary | ICD-10-CM | POA: Insufficient documentation

## 2010-10-20 DIAGNOSIS — Z86718 Personal history of other venous thrombosis and embolism: Secondary | ICD-10-CM | POA: Insufficient documentation

## 2010-10-20 DIAGNOSIS — M79609 Pain in unspecified limb: Secondary | ICD-10-CM

## 2010-10-20 DIAGNOSIS — I10 Essential (primary) hypertension: Secondary | ICD-10-CM | POA: Insufficient documentation

## 2010-10-20 DIAGNOSIS — Z79899 Other long term (current) drug therapy: Secondary | ICD-10-CM | POA: Insufficient documentation

## 2010-10-20 DIAGNOSIS — E119 Type 2 diabetes mellitus without complications: Secondary | ICD-10-CM | POA: Insufficient documentation

## 2010-10-20 DIAGNOSIS — L989 Disorder of the skin and subcutaneous tissue, unspecified: Secondary | ICD-10-CM | POA: Insufficient documentation

## 2010-10-20 DIAGNOSIS — Z7901 Long term (current) use of anticoagulants: Secondary | ICD-10-CM | POA: Insufficient documentation

## 2010-10-20 DIAGNOSIS — Z862 Personal history of diseases of the blood and blood-forming organs and certain disorders involving the immune mechanism: Secondary | ICD-10-CM | POA: Insufficient documentation

## 2010-10-20 LAB — POCT I-STAT, CHEM 8
Calcium, Ion: 1.1 mmol/L — ABNORMAL LOW (ref 1.12–1.32)
Glucose, Bld: 171 mg/dL — ABNORMAL HIGH (ref 70–99)
HCT: 54 % — ABNORMAL HIGH (ref 39.0–52.0)
TCO2: 21 mmol/L (ref 0–100)

## 2010-10-20 LAB — CBC
HCT: 49.1 % (ref 39.0–52.0)
MCH: 32.1 pg (ref 26.0–34.0)
MCV: 93.3 fL (ref 78.0–100.0)
Platelets: 254 10*3/uL (ref 150–400)
RDW: 13.9 % (ref 11.5–15.5)

## 2010-10-20 LAB — DIFFERENTIAL
Eosinophils Absolute: 0.1 10*3/uL (ref 0.0–0.7)
Eosinophils Relative: 1 % (ref 0–5)
Lymphocytes Relative: 36 % (ref 12–46)
Lymphs Abs: 3 10*3/uL (ref 0.7–4.0)
Monocytes Absolute: 0.7 10*3/uL (ref 0.1–1.0)
Monocytes Relative: 9 % (ref 3–12)

## 2010-10-20 LAB — PROTIME-INR
INR: 1.4 (ref 0.00–1.49)
Prothrombin Time: 17.4 seconds — ABNORMAL HIGH (ref 11.6–15.2)

## 2010-10-20 LAB — GLUCOSE, CAPILLARY: Glucose-Capillary: 161 mg/dL — ABNORMAL HIGH (ref 70–99)

## 2010-11-13 ENCOUNTER — Emergency Department (HOSPITAL_COMMUNITY): Payer: Medicare Other

## 2010-11-13 ENCOUNTER — Encounter (HOSPITAL_COMMUNITY): Payer: Self-pay | Admitting: Radiology

## 2010-11-13 ENCOUNTER — Inpatient Hospital Stay (HOSPITAL_COMMUNITY): Payer: Medicare Other

## 2010-11-13 ENCOUNTER — Inpatient Hospital Stay (HOSPITAL_COMMUNITY)
Admission: EM | Admit: 2010-11-13 | Discharge: 2010-11-18 | DRG: 193 | Disposition: A | Discharge status: Home Health | Payer: Medicare Other | Attending: Family Medicine | Admitting: Family Medicine

## 2010-11-13 DIAGNOSIS — R1313 Dysphagia, pharyngeal phase: Secondary | ICD-10-CM | POA: Diagnosis present

## 2010-11-13 DIAGNOSIS — E785 Hyperlipidemia, unspecified: Secondary | ICD-10-CM | POA: Diagnosis present

## 2010-11-13 DIAGNOSIS — J159 Unspecified bacterial pneumonia: Secondary | ICD-10-CM

## 2010-11-13 DIAGNOSIS — Z7982 Long term (current) use of aspirin: Secondary | ICD-10-CM

## 2010-11-13 DIAGNOSIS — D869 Sarcoidosis, unspecified: Secondary | ICD-10-CM | POA: Diagnosis present

## 2010-11-13 DIAGNOSIS — J449 Chronic obstructive pulmonary disease, unspecified: Secondary | ICD-10-CM

## 2010-11-13 DIAGNOSIS — J90 Pleural effusion, not elsewhere classified: Secondary | ICD-10-CM | POA: Diagnosis present

## 2010-11-13 DIAGNOSIS — M549 Dorsalgia, unspecified: Secondary | ICD-10-CM | POA: Diagnosis present

## 2010-11-13 DIAGNOSIS — D649 Anemia, unspecified: Secondary | ICD-10-CM | POA: Diagnosis present

## 2010-11-13 DIAGNOSIS — I509 Heart failure, unspecified: Secondary | ICD-10-CM | POA: Diagnosis present

## 2010-11-13 DIAGNOSIS — J189 Pneumonia, unspecified organism: Principal | ICD-10-CM | POA: Diagnosis present

## 2010-11-13 DIAGNOSIS — J8410 Pulmonary fibrosis, unspecified: Secondary | ICD-10-CM | POA: Diagnosis present

## 2010-11-13 DIAGNOSIS — R0602 Shortness of breath: Secondary | ICD-10-CM

## 2010-11-13 DIAGNOSIS — F17200 Nicotine dependence, unspecified, uncomplicated: Secondary | ICD-10-CM | POA: Diagnosis present

## 2010-11-13 DIAGNOSIS — E119 Type 2 diabetes mellitus without complications: Secondary | ICD-10-CM | POA: Diagnosis present

## 2010-11-13 DIAGNOSIS — Z86718 Personal history of other venous thrombosis and embolism: Secondary | ICD-10-CM

## 2010-11-13 DIAGNOSIS — Z86711 Personal history of pulmonary embolism: Secondary | ICD-10-CM

## 2010-11-13 DIAGNOSIS — I2510 Atherosclerotic heart disease of native coronary artery without angina pectoris: Secondary | ICD-10-CM | POA: Diagnosis present

## 2010-11-13 DIAGNOSIS — IMO0002 Reserved for concepts with insufficient information to code with codable children: Secondary | ICD-10-CM

## 2010-11-13 DIAGNOSIS — F341 Dysthymic disorder: Secondary | ICD-10-CM | POA: Diagnosis present

## 2010-11-13 DIAGNOSIS — J9600 Acute respiratory failure, unspecified whether with hypoxia or hypercapnia: Secondary | ICD-10-CM | POA: Diagnosis present

## 2010-11-13 HISTORY — DX: Essential (primary) hypertension: I10

## 2010-11-13 HISTORY — DX: Acute embolism and thrombosis of unspecified deep veins of unspecified lower extremity: I82.409

## 2010-11-13 LAB — BLOOD GAS, ARTERIAL
Acid-Base Excess: 4.2 mmol/L — ABNORMAL HIGH (ref 0.0–2.0)
Drawn by: 320991
O2 Content: 6 L/min
O2 Saturation: 83.1 %
Patient temperature: 98.6
pO2, Arterial: 49.4 mmHg — ABNORMAL LOW (ref 80.0–100.0)

## 2010-11-13 LAB — CARDIAC PANEL(CRET KIN+CKTOT+MB+TROPI)
CK, MB: 2.4 ng/mL (ref 0.3–4.0)
CK, MB: 1.9 ng/mL (ref 0.3–4.0)
Relative Index: 1.8 (ref 0.0–2.5)
Relative Index: 1.6 (ref 0.0–2.5)
Total CK: 132 U/L (ref 7–232)
Troponin I: <0.3 ng/mL (ref <0.30)

## 2010-11-13 LAB — CBC
MCHC: 33.5 g/dL (ref 30.0–36.0)
Platelets: 242 10*3/uL (ref 150–400)
RDW: 14.1 % (ref 11.5–15.5)
WBC: 8.3 10*3/uL (ref 4.0–10.5)

## 2010-11-13 LAB — BASIC METABOLIC PANEL
CO2: 27 mEq/L (ref 19–32)
Calcium: 9.2 mg/dL (ref 8.4–10.5)
Creatinine, Ser: 0.73 mg/dL (ref 0.50–1.35)
GFR calc non Af Amer: >60 mL/min (ref >60)
Sodium: 143 mEq/L (ref 135–145)

## 2010-11-13 LAB — MRSA PCR SCREENING: MRSA by PCR: POSITIVE — AB

## 2010-11-13 LAB — PRO B NATRIURETIC PEPTIDE: Pro B Natriuretic peptide (BNP): 2209 pg/mL — ABNORMAL HIGH (ref 0–125)

## 2010-11-13 LAB — PROTIME-INR
INR: 0.98 (ref 0.00–1.49)
Prothrombin Time: 13.2 seconds (ref 11.6–15.2)

## 2010-11-13 LAB — GLUCOSE, CAPILLARY: Glucose-Capillary: 160 mg/dL — ABNORMAL HIGH (ref 70–99)

## 2010-11-13 LAB — HEPATIC FUNCTION PANEL
Bilirubin, Direct: <0.1 mg/dL (ref 0.0–0.3)
Total Bilirubin: 0.3 mg/dL (ref 0.3–1.2)

## 2010-11-13 LAB — LIPASE, BLOOD: Lipase: 14 U/L (ref 11–59)

## 2010-11-13 LAB — HEPARIN LEVEL (UNFRACTIONATED): Heparin Unfractionated: 0.4 IU/mL (ref 0.30–0.70)

## 2010-11-13 MED ORDER — IOHEXOL 350 MG/ML SOLN
100.0000 mL | Freq: Once | INTRAVENOUS | Status: AC | PRN
  Administered 2010-11-13: 80 mL via INTRAVENOUS

## 2010-11-14 DIAGNOSIS — R0902 Hypoxemia: Secondary | ICD-10-CM

## 2010-11-14 DIAGNOSIS — R0609 Other forms of dyspnea: Secondary | ICD-10-CM

## 2010-11-14 DIAGNOSIS — J84112 Idiopathic pulmonary fibrosis: Secondary | ICD-10-CM

## 2010-11-14 DIAGNOSIS — R0989 Other specified symptoms and signs involving the circulatory and respiratory systems: Secondary | ICD-10-CM

## 2010-11-14 DIAGNOSIS — J189 Pneumonia, unspecified organism: Secondary | ICD-10-CM

## 2010-11-14 LAB — LIPID PANEL
Cholesterol: 187 mg/dL (ref 0–200)
Total CHOL/HDL Ratio: 2.8 RATIO
VLDL: 12 mg/dL (ref 0–40)

## 2010-11-14 LAB — CBC
HCT: 36.8 % — ABNORMAL LOW (ref 39.0–52.0)
Hemoglobin: 11.8 g/dL — ABNORMAL LOW (ref 13.0–17.0)
MCH: 30.8 pg (ref 26.0–34.0)
MCV: 96.1 fL (ref 78.0–100.0)
RBC: 3.83 MIL/uL — ABNORMAL LOW (ref 4.22–5.81)

## 2010-11-14 LAB — HIV ANTIBODY (ROUTINE TESTING W REFLEX): HIV: NONREACTIVE

## 2010-11-14 LAB — GLUCOSE, CAPILLARY: Glucose-Capillary: 207 mg/dL — ABNORMAL HIGH (ref 70–99)

## 2010-11-14 LAB — BASIC METABOLIC PANEL
BUN: 11 mg/dL (ref 6–23)
CO2: 30 mEq/L (ref 19–32)
Calcium: 8.7 mg/dL (ref 8.4–10.5)
Chloride: 104 mEq/L (ref 96–112)
Creatinine, Ser: 0.85 mg/dL (ref 0.50–1.35)

## 2010-11-14 LAB — PROTIME-INR
INR: 1.19 (ref 0.00–1.49)
Prothrombin Time: 15.4 seconds — ABNORMAL HIGH (ref 11.6–15.2)

## 2010-11-14 LAB — CARDIAC PANEL(CRET KIN+CKTOT+MB+TROPI)
CK, MB: 1.5 ng/mL (ref 0.3–4.0)
Total CK: 99 U/L (ref 7–232)
Troponin I: <0.3 ng/mL (ref <0.30)

## 2010-11-14 LAB — URINE DRUGS OF ABUSE SCREEN W ALC, ROUTINE (REF LAB)
Barbiturate Quant, Ur: NEGATIVE
Benzodiazepines.: POSITIVE — AB
Marijuana Metabolite: NEGATIVE
Methadone: NEGATIVE
Phencyclidine (PCP): NEGATIVE
Propoxyphene: NEGATIVE

## 2010-11-14 LAB — ANGIOTENSIN CONVERTING ENZYME: Angiotensin-Converting Enzyme: >100 U/L — ABNORMAL HIGH (ref 8–52)

## 2010-11-14 LAB — STREP PNEUMONIAE URINARY ANTIGEN: Strep Pneumo Urinary Antigen: NEGATIVE

## 2010-11-14 LAB — SEDIMENTATION RATE: Sed Rate: 49 mm/hr — ABNORMAL HIGH (ref 0–16)

## 2010-11-15 ENCOUNTER — Inpatient Hospital Stay (HOSPITAL_COMMUNITY): Payer: Medicare Other

## 2010-11-15 DIAGNOSIS — A150 Tuberculosis of lung: Secondary | ICD-10-CM

## 2010-11-15 DIAGNOSIS — J189 Pneumonia, unspecified organism: Secondary | ICD-10-CM

## 2010-11-15 LAB — ANA: Anti Nuclear Antibody(ANA): NEGATIVE

## 2010-11-15 LAB — CBC
MCV: 95 fL (ref 78.0–100.0)
Platelets: 219 10*3/uL (ref 150–400)
RBC: 3.59 MIL/uL — ABNORMAL LOW (ref 4.22–5.81)
RDW: 13.6 % (ref 11.5–15.5)
WBC: 7.4 10*3/uL (ref 4.0–10.5)

## 2010-11-15 LAB — BASIC METABOLIC PANEL
Calcium: 8.7 mg/dL (ref 8.4–10.5)
Chloride: 101 mEq/L (ref 96–112)
Creatinine, Ser: 0.77 mg/dL (ref 0.50–1.35)
GFR calc Af Amer: >60 mL/min (ref >60)
Sodium: 140 mEq/L (ref 135–145)

## 2010-11-15 LAB — PROTIME-INR: Prothrombin Time: 17.3 seconds — ABNORMAL HIGH (ref 11.6–15.2)

## 2010-11-15 LAB — GLUCOSE, CAPILLARY
Glucose-Capillary: 206 mg/dL — ABNORMAL HIGH (ref 70–99)
Glucose-Capillary: 219 mg/dL — ABNORMAL HIGH (ref 70–99)

## 2010-11-15 LAB — LEGIONELLA ANTIGEN, URINE

## 2010-11-15 LAB — HEPARIN LEVEL (UNFRACTIONATED): Heparin Unfractionated: 0.27 IU/mL — ABNORMAL LOW (ref 0.30–0.70)

## 2010-11-16 ENCOUNTER — Inpatient Hospital Stay (HOSPITAL_COMMUNITY): Payer: Medicare Other

## 2010-11-16 LAB — BENZODIAZEPINE, QUANTITATIVE, URINE
Flurazepam GC/MS Conf: NEGATIVE NG/ML
Oxazepam GC/MS Conf: NEGATIVE NG/ML
Temazepam GC/MS Conf: NEGATIVE NG/ML

## 2010-11-16 LAB — BASIC METABOLIC PANEL
BUN: 11 mg/dL (ref 6–23)
CO2: 33 mEq/L — ABNORMAL HIGH (ref 19–32)
Calcium: 8.9 mg/dL (ref 8.4–10.5)
Creatinine, Ser: 0.66 mg/dL (ref 0.50–1.35)
GFR calc non Af Amer: >60 mL/min (ref >60)
Glucose, Bld: 290 mg/dL — ABNORMAL HIGH (ref 70–99)
Sodium: 138 mEq/L (ref 135–145)

## 2010-11-16 LAB — GLUCOSE, CAPILLARY
Glucose-Capillary: 261 mg/dL — ABNORMAL HIGH (ref 70–99)
Glucose-Capillary: 180 mg/dL — ABNORMAL HIGH (ref 70–99)

## 2010-11-16 LAB — CBC
Hemoglobin: 11.1 g/dL — ABNORMAL LOW (ref 13.0–17.0)
MCH: 30.8 pg (ref 26.0–34.0)
MCHC: 32.6 g/dL (ref 30.0–36.0)
MCV: 94.4 fL (ref 78.0–100.0)
RBC: 3.6 MIL/uL — ABNORMAL LOW (ref 4.22–5.81)

## 2010-11-17 LAB — BASIC METABOLIC PANEL
BUN: 15 mg/dL (ref 6–23)
CO2: 33 mEq/L — ABNORMAL HIGH (ref 19–32)
Calcium: 9.2 mg/dL (ref 8.4–10.5)
Creatinine, Ser: 0.81 mg/dL (ref 0.50–1.35)
GFR calc non Af Amer: >60 mL/min (ref >60)
Glucose, Bld: 188 mg/dL — ABNORMAL HIGH (ref 70–99)
Sodium: 137 mEq/L (ref 135–145)

## 2010-11-17 LAB — GLUCOSE, CAPILLARY: Glucose-Capillary: 152 mg/dL — ABNORMAL HIGH (ref 70–99)

## 2010-11-18 LAB — GLUCOSE, CAPILLARY
Glucose-Capillary: 251 mg/dL — ABNORMAL HIGH (ref 70–99)
Glucose-Capillary: 159 mg/dL — ABNORMAL HIGH (ref 70–99)

## 2010-11-20 LAB — CULTURE, BLOOD (ROUTINE X 2): Culture: NO GROWTH

## 2010-11-27 NOTE — Discharge Summary (Signed)
NAMEHERSH, Ryan [name]               ACCOUNT NO.:  [id number]  MEDICAL RECORD NO.:  [id number]  LOCATION:  3315                         FACILITY:  [facility]  PHYSICIAN:  Santiago Bumpers. Michella Detjen, M.D.DATE OF BIRTH:  January 27, 1951  DATE OF ADMISSION:  11/13/2010 DATE OF DISCHARGE:  11/18/2010                              DISCHARGE SUMMARY   PRIMARY CARE PHYSICIAN:  Brett Canales A. Cleta Alberts, MD, at [facility].  CONSULTANTS:  Critical Care Medicine, Dr. Molli Knock, and Infectious Disease.  DISCHARGE DIAGNOSES:  Primary: 1. Pneumonia. 2. Congestive heart failure exacerbation. 3. Possible sarcoidosis at baseline. Secondary: 1. History of deep venous thrombosis. 2. Anxiety. 3. Post-traumatic stress disorder. 4. Chronic back pain. 5. Coronary artery disease. 6. Hyperlipidemia. 7. Diabetes mellitus type 2. 8. Hypertension. 9. Gastroesophageal reflux disease.  DISCHARGE MEDICATIONS:  New: 1. Blood sugar meter. 2. Blood sugar test strips. 3. Lasix 40 mg by mouth daily. 4. Lantus 20 units subcutaneous daily. 5. Insulin syringes 6. Levofloxacin 750 mg by mouth q.24 h. take for 1 day. Home Meds: 1. Alprazolam 1 mg by mouth twice daily. 2. Aspirin 81 mg by mouth daily. 3. Gabapentin 400 mg by mouth t.i.d. 4. Hydrochlorothiazide 25 mg by mouth daily. 5. Losartan 100 mg by mouth daily. 6. Metformin 1000 mg by mouth twice daily. 7. Multivitamins 1 tablet by mouth daily. 8. Oxycodone 1 tablet by mouth every 4 hours as needed for pain, take     for 2 days, given 18. 9. Ranitidine 300 mg 1 tablet by mouth twice daily. 10.Simvastatin 40 mg 1 tablet by mouth daily.  The patient to stop taking: 1. Glipizide 10 mg by mouth twice daily. 2. Coumadin 1 tablet by mouth daily.  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 1. Pneumonia.  The patient was discharged on levofloxacin 750 mg     daily.  He was on day 6 of 7 of antibiotic therapy.  The patient     was initially started on vancomycin and Zosyn, but was switched by     Critical Care Medicine, confirmed by ID appropriate regimen of     azithromycin and Rocephin.  This was then changed to levofloxacin by ID.     The patient's pulmonary status initially was requiring 100% on non-     rebreather, but the patient was slowly weaned over the course of     several days to down to 6 liters and eventually 2 liters.  The patient had an ambulatory O2 testing with sats     dropping  below 88% so the patient was discharged with home O2. 2. Sarcoidosis - the patient with fibrosis worsening on CTA.  The     patient with an ACE level greater than 100.  Critical Care     Medicine's plan was to give the patient to dry weight and finish     course of antibiotic treatment for pneumonia.  He will need     followup with possible bronchoscopy and transbronchial biopsy to  test for sarcoidosis.  He will also possibly need a followup CT     scan.  The decision was made to hold steroids for treatment of     sarcoidosis due to the patient's current infection.  EKGs were     followed to monitor for any sarcoid related conduction abnormalities.  The patient     was instructed to get an Ophthalmology followup appointment. 3. Concern for pulmonary embolism/DVT.  The patient with lower     extremity Dopplers negative for DVT.  CTA was negative for PE as well.  The     patient was on chronic Coumadin, but came in with subtherapeutic     INR.  Records were pursued from the patient's primary care     physician, but the patient had not seen him in over 15 months.  The     patient stated that his DVT was over a year ago at the [place].  The decision was     made to discontinue heparin and Coumadin therapy which were     initially started for concern of  DVT or PE. 4. CHF - the patient with an echo EF of 55-60% and no diastolic     dysfunction noted.  Despite this, the patient had a BNP on     admission of 2200.  The patient's breathing improved clinically     over the course of several days with 40 mg IV Lasix b.i.d.  The patient was fluid     restricted to p.o. intake of 1200 mL and given 1.5-gram sodium     diet. The  patient was negative almost 2-3 liters for the first 3 days.  The     patient was switched to Lasix 40 p.o. once daily at discharge.  The     patient will need followup for concerns of fluid overload.  The     patient will follow daily weights.   His electrolytes remained stable on this regimen. 5. Concern for MI - the patient with cardiac enzymes negative x3 and     no EKG changes. 6. Anxiety/PTSD from [place] - the patient was continued on home doses     of Xanax. 7. Chronic back pain - the patient with home meds of oxycodone 30 mg     q.4 h. p.r.n.  In addition, the patient was given Toradol 30 mg q.6     h. for breakthrough pain.  The patient complained that this did not     control his pain very well.  The patient with a history of narcotic     abuse, so additional medication was not given. 8. CAD/hyperlipidemia - the patient was stable throughout his hospital     course.  Crestor was continued.  The patient was encouraged to quit     smoking. 9. Diabetes.  The patient with CBGs greater than 250 on sliding scale     insulin.  This was increased to moderate sliding scale insulin.     Eventually decision was made to start the patient on Lantus 20     units once daily in addition to his home metformin.  LABORATORY DATA:  CTA on November 13, 2010: 1. Severe bilateral pneumonia, greater on the right. 2. No evidence of acute pulmonary embolism. 3. Reactive hilar, mediastinal, and right thoracic inlet     lymphadenopathy. 4. Small layering pleural effusions.  Increasing fibrosis from     previous CTA in July 2011 was also  noted.  Venous duplex on November 13, 2010, no evidence of deep vein and superficial thrombosis involving the right lower extremity or left lower extremity. No evidence of Baker cyst on the right or left.  Chest x-ray on November 15, 2010, noted improved bilateral pneumonia. Probable small right pleural effusion.   BMP at discharge 137, 3.7, 99, 33, 188, 15, 0.81.   CBC at discharge white count 4.6, hemoglobin 11.1 stable, platelets 213.  Urine Legionella negative for Legionella pneumophila.   ANA negative.   Angiotensin-converting enzyme greater than 100.   Rheumatoid factor less than 10.  C-reactive protein 10.2, high. HIV nonreactive.   Strep pneumonia, urinary antigen negative.   ESR 49. UDS positive for benzos.   Cardiac enzymes negative x3.   Lipid profile with cholesterol 187, triglycerides 58, HDL 68, LDL 107.   TSH 0.420. Hemoglobin A1c 8.6.   Lipase 14.   LFTs within normal limits.   Pro-BNP 2209.    CONDITION AT TIME OF DISCHARGE:  The patient discharged home in stable condition, but requiring home O2.  The patient was instructed to return if symptoms worsen or do not continue to improve.  The patient was instructed to use home oxygen. CHF instructions were given.   DISPOSITION:  Home.  DISCHARGE FOLLOWUP:  The patient to call to schedule followup appointment with Dr. Cleta Alberts.  The patient also has followup appointment with Pulmonary scheduled.  He will need to follow up with Pulmonary in approximately 6 weeks after resolution of pneumonia and clearing of CHF exacerbation.    ______________________________ Tana Conch, MD   ______________________________ Santiago Bumpers. Leveda Anna, M.D.    SH/MEDQ  D:  11/20/2010  T:  11/20/2010  Job:  [id number]  cc:   Felipa Evener, MD Stan Head. Cleta Alberts, M.D.  Electronically Signed by Tana Conch MD on 11/21/2010 10:50:13 PM Electronically Signed by Doralee Albino M.D. on 11/27/2010 03:29:48 PM

## 2010-12-04 NOTE — H&P (Signed)
NAMEARLAN, Ryan [name]               ACCOUNT NO.:  [id number]  MEDICAL RECORD NO.:  [id number]  LOCATION:  3315                         FACILITY:  [facility]  PHYSICIAN:  Ryan Ramp, MD        DATE OF BIRTH:  1951-03-11  DATE OF ADMISSION:  11/13/2010 DATE OF DISCHARGE:                             HISTORY & PHYSICAL   PRIMARY [name] PROVIDER:  Stan Head. Ryan Alberts, MD of Ryan [name].  HISTORY OF PRESENT ILLNESS:  [name] is a 60 year old male who presents to the hospital with dyspnea and back pain for 1 day.  Pain started yesterday.  Denies any fevers or chills, does note some nausea and sweating, has a previous history of pneumonia and PE 1 year ago.  He says he feels about the same.  Last hospitalization was over 3 months ago.  No chest pain or palpitations, feels pretty well overall with the exception of his significant dyspnea.  MEDICAL HISTORY: 1. Diabetes. 2. Hypertension. 3. Coronary artery disease status post one stent 1 year ago. 4. Pneumonia and a PE a year ago. 5. Chronic back pain. 6. Anxiety. 7. Smoker.  PAST SURGICAL HISTORY:  Back surgery for chronic back pain and cardiac stent 1 year ago.  MEDICATIONS: 1. Aspirin 81 daily. 2. Glipizide 10 daily. 3. Metformin 1000 twice a day. 4. Warfarin 10 daily. 5. Oxycodone 30 q.4 h. 6. Alprazolam 1 twice a day. 7. Ranitidine 300 twice a day. 8. Gabapentin 400 3 times a day and cannot remember the rest of his     medications.  ALLERGIES:  MORPHINE and LISINOPRIL cause angioedema.  SOCIAL HISTORY:  Lives alone in [place].  He is retired.  Uses tobacco daily.  No alcohol.  No drugs.  FAMILY HISTORY:  Noncontributory.  REVIEW OF SYSTEMS:  Please see HPI, otherwise normal.  OBJECTIVE:  VITAL SIGNS:  Temperature 99.5, heart rate 60, respirations 18, blood pressure 139/51, satting 72% on room air, but 95% on oxygen non-rebreather.  On exam, he is wearing his non-rebreather. GENERAL:  Well, in no acute  distress. HEENT:  Moist mucous membranes. NECK:  Flat neck veins. HEART:  Regular rate and rhythm.  No murmurs, rubs, or gallops. LUNGS:  Normal work of breathing.  Crackles and rales bilaterally.  No wheezes. ABDOMEN:  Normoactive bowel sounds, nontender, soft. EXTREMITIES:  Trace edema in bilateral lower extremities. Calves are of same size. NEURO:  Alert and oriented x3.  LABORATORY DATA AND STUDIES:  Basic metabolic panel significant for creatinine of 0.73 and a glucose of 182.  CBC shows white count of 7.3, hemoglobin 12.7, platelets 242.  His INR is 0.98.  Chest x-ray shows pneumonia in entire right lung and upper left lobe.  EKG shows sinus brady with a first-degree A-V block, normal QRS segment, however, elevated ST-segment in V2 isolated.  No ST changes and an isolated Q- wave in V1.  This is unchanged from his prior EKG.  ASSESSMENT AND PLAN:  A 60 year old male with pneumonia. 1. Pneumonia, community acquired based on last hospitalization over 3     months.  The patient is on a non-rebreather due to oxygen     saturations, 70s  and 80s on room air.  This is all consistent with     pneumonia.  Plan to treat with ceftriaxone and azithromycin.  We     will admit to step-down unit due to his O2 saturations and follow.     Pulmonary embolism is a possibility.  However, he is supposed to be     on his warfarin but obviously not based on his INR.  However, his     legs are symmetrical.  As he is so subtherapeutic with his INR,     restarted on his warfarin would make him hypercoagulable initially.     If he does have a pulmonary embolism, would make this worse,     therefore, we will obtain a CT angiogram to evaluate for pulmonary     embolism because that would change our management. 2. Hypertension.  No medications on med list; however, I suspect he is     on at least a beta-blocker as his heart rate is in the 60s and 50s,     and he has a history of a coronary artery stent.   We will allow med     rec to be completed by the pharmacy tech and restart home     medications.  Avoid ACE inhibitor as he has angioedema with that. 3. Diabetes, on his home medicines.  We will check glucoses q.a.c. and     at bedtime.  Use sliding scale insulin as he is consistently above     200. 4. Pain.  Due to chronic back pain not sure his dose is accurate at 30     six times a day.  I will given him 10 mg q.4 h. until confirmed     with the med rec to avoid over sedation with this continuous oxygen     status already. 5. Anxiety.  We will continue home Xanax. 6. History of pulmonary embolism.  We will restart warfarin per     pharmacy and will wait until his CT angiogram comes back to get     that done. 7. Smoker.  Tobacco cessation counseling will be ordered 8. Code status full. 9. Disposition when improved.    Ryan Graham, MD   ______________________________ Ryan Ramp, MD   EC/MEDQ  D:  11/13/2010  T:  11/14/2010  Job:  [id number]  Electronically Signed by Ryan [name]  on 11/29/2010 02:32:36 PM Electronically Signed by Ryan Levy MD on 12/04/2010 08:34:18 AM

## 2011-01-29 LAB — BASIC METABOLIC PANEL
CO2: 27
CO2: 30
Calcium: 9.2
Chloride: 96
Creatinine, Ser: 0.95
GFR calc Af Amer: >60
GFR calc Af Amer: >60
GFR calc non Af Amer: >60
Glucose, Bld: 295 — ABNORMAL HIGH
Potassium: 4.4
Sodium: 134 — ABNORMAL LOW

## 2011-01-29 LAB — GLUCOSE, CAPILLARY
Glucose-Capillary: 223 — ABNORMAL HIGH
Glucose-Capillary: 234 — ABNORMAL HIGH
Glucose-Capillary: 235 — ABNORMAL HIGH
Glucose-Capillary: 260 — ABNORMAL HIGH
Glucose-Capillary: 263 — ABNORMAL HIGH
Glucose-Capillary: 266 — ABNORMAL HIGH
Glucose-Capillary: 271 — ABNORMAL HIGH
Glucose-Capillary: 276 — ABNORMAL HIGH
Glucose-Capillary: 278 — ABNORMAL HIGH
Glucose-Capillary: 289 — ABNORMAL HIGH
Glucose-Capillary: 336 — ABNORMAL HIGH

## 2011-01-29 LAB — AMYLASE: Amylase: 133 — ABNORMAL HIGH

## 2011-01-29 LAB — URINALYSIS, ROUTINE W REFLEX MICROSCOPIC
Bilirubin Urine: NEGATIVE
Hgb urine dipstick: NEGATIVE
Ketones, ur: NEGATIVE
Nitrite: NEGATIVE
Specific Gravity, Urine: 1.031 — ABNORMAL HIGH
Urobilinogen, UA: 0.2

## 2011-01-29 LAB — CBC
HCT: 38.1 — ABNORMAL LOW
Hemoglobin: 12.6 — ABNORMAL LOW
MCHC: 33.1
MCV: 97.8
Platelets: 256
RBC: 3.89 — ABNORMAL LOW
RDW: 12.8
WBC: 7.3
WBC: 9.7

## 2011-01-29 LAB — DIFFERENTIAL
Basophils Absolute: 0
Lymphocytes Relative: 20
Lymphs Abs: 1.9
Neutro Abs: 6.5
Neutrophils Relative %: 67

## 2011-01-29 LAB — HEPATIC FUNCTION PANEL
ALT: 15
Albumin: 3.6
Alkaline Phosphatase: 72
Total Protein: 7.2

## 2011-01-29 LAB — LIPID PANEL
Cholesterol: 214 — ABNORMAL HIGH
HDL: 41
Total CHOL/HDL Ratio: 5.2

## 2011-01-29 LAB — LIPASE, BLOOD
Lipase: 113 — ABNORMAL HIGH
Lipase: 87 — ABNORMAL HIGH

## 2011-01-29 LAB — URINE MICROSCOPIC-ADD ON

## 2011-02-06 ENCOUNTER — Emergency Department (HOSPITAL_COMMUNITY): Payer: Medicare Other

## 2011-02-06 ENCOUNTER — Emergency Department (HOSPITAL_COMMUNITY)
Admission: EM | Admit: 2011-02-06 | Discharge: 2011-02-06 | Disposition: A | Discharge status: Home / Self Care | Payer: Medicare Other | Attending: Emergency Medicine | Admitting: Emergency Medicine

## 2011-02-06 DIAGNOSIS — R197 Diarrhea, unspecified: Secondary | ICD-10-CM | POA: Insufficient documentation

## 2011-02-06 DIAGNOSIS — Z79899 Other long term (current) drug therapy: Secondary | ICD-10-CM | POA: Insufficient documentation

## 2011-02-06 DIAGNOSIS — E669 Obesity, unspecified: Secondary | ICD-10-CM | POA: Insufficient documentation

## 2011-02-06 DIAGNOSIS — M549 Dorsalgia, unspecified: Secondary | ICD-10-CM | POA: Insufficient documentation

## 2011-02-06 DIAGNOSIS — R112 Nausea with vomiting, unspecified: Secondary | ICD-10-CM | POA: Insufficient documentation

## 2011-02-06 DIAGNOSIS — I10 Essential (primary) hypertension: Secondary | ICD-10-CM | POA: Insufficient documentation

## 2011-02-06 DIAGNOSIS — E119 Type 2 diabetes mellitus without complications: Secondary | ICD-10-CM | POA: Insufficient documentation

## 2011-02-06 DIAGNOSIS — R109 Unspecified abdominal pain: Secondary | ICD-10-CM | POA: Insufficient documentation

## 2011-02-06 DIAGNOSIS — G8929 Other chronic pain: Secondary | ICD-10-CM | POA: Insufficient documentation

## 2011-02-06 LAB — COMPREHENSIVE METABOLIC PANEL
AST: 11 U/L (ref 0–37)
Albumin: 3.8 g/dL (ref 3.5–5.2)
Calcium: 9.4 mg/dL (ref 8.4–10.5)
Creatinine, Ser: 0.75 mg/dL (ref 0.50–1.35)
Sodium: 142 mEq/L (ref 135–145)

## 2011-02-06 LAB — DIFFERENTIAL
Basophils Absolute: 0.1 10*3/uL (ref 0.0–0.1)
Eosinophils Absolute: 0.1 10*3/uL (ref 0.0–0.7)
Lymphocytes Relative: 30 % (ref 12–46)
Lymphs Abs: 2.7 10*3/uL (ref 0.7–4.0)
Neutro Abs: 5.3 10*3/uL (ref 1.7–7.7)

## 2011-02-06 LAB — CBC
MCH: 32.7 pg (ref 26.0–34.0)
MCV: 93.4 fL (ref 78.0–100.0)
Platelets: 230 10*3/uL (ref 150–400)
RDW: 13.7 % (ref 11.5–15.5)
WBC: 9 10*3/uL (ref 4.0–10.5)

## 2011-02-06 LAB — URINE MICROSCOPIC-ADD ON

## 2011-02-06 LAB — URINALYSIS, ROUTINE W REFLEX MICROSCOPIC
Glucose, UA: >1000 mg/dL — AB
Leukocytes, UA: NEGATIVE
Specific Gravity, Urine: >1.046 — ABNORMAL HIGH (ref 1.005–1.030)
pH: 6 (ref 5.0–8.0)

## 2011-02-06 MED ORDER — IOHEXOL 300 MG/ML  SOLN
100.0000 mL | Freq: Once | INTRAMUSCULAR | Status: AC | PRN
  Administered 2011-02-06: 100 mL via INTRAVENOUS

## 2011-04-03 ENCOUNTER — Emergency Department (HOSPITAL_COMMUNITY)
Admission: EM | Admit: 2011-04-03 | Discharge: 2011-04-03 | Disposition: A | Discharge status: Home / Self Care | Payer: Medicare Other | Attending: Emergency Medicine | Admitting: Emergency Medicine

## 2011-04-03 ENCOUNTER — Encounter (HOSPITAL_COMMUNITY): Payer: Self-pay

## 2011-04-03 DIAGNOSIS — R509 Fever, unspecified: Secondary | ICD-10-CM | POA: Insufficient documentation

## 2011-04-03 DIAGNOSIS — L0291 Cutaneous abscess, unspecified: Secondary | ICD-10-CM

## 2011-04-03 DIAGNOSIS — IMO0002 Reserved for concepts with insufficient information to code with codable children: Secondary | ICD-10-CM | POA: Insufficient documentation

## 2011-04-03 DIAGNOSIS — R739 Hyperglycemia, unspecified: Secondary | ICD-10-CM

## 2011-04-03 LAB — GLUCOSE, CAPILLARY

## 2011-04-03 MED ORDER — OXYCODONE-ACETAMINOPHEN 5-325 MG PO TABS
1.0000 | ORAL_TABLET | Freq: Once | ORAL | Status: AC
  Administered 2011-04-03: 1 via ORAL
  Filled 2011-04-03: qty 1

## 2011-04-03 MED ORDER — DOXYCYCLINE HYCLATE 100 MG PO CAPS: 100.0000 mg | ORAL_CAPSULE | Freq: Two times a day (BID) | ORAL | Status: AC

## 2011-04-03 MED ORDER — OXYCODONE-ACETAMINOPHEN 5-325 MG PO TABS: 1.0000 | ORAL_TABLET | Freq: Once | ORAL | Status: AC

## 2011-04-03 MED ORDER — LORAZEPAM 1 MG PO TABS
1.0000 mg | ORAL_TABLET | Freq: Once | ORAL | Status: AC
  Administered 2011-04-03: 1 mg via ORAL
  Filled 2011-04-03: qty 1

## 2011-04-03 MED ORDER — DOXYCYCLINE HYCLATE 100 MG PO CAPS: 100.0000 mg | ORAL_CAPSULE | Freq: Two times a day (BID) | ORAL | Status: DC

## 2011-04-03 MED ORDER — INSULIN ASPART 100 UNIT/ML ~~LOC~~ SOLN
10.0000 [IU] | Freq: Once | SUBCUTANEOUS | Status: AC
  Administered 2011-04-03: 10 [IU] via SUBCUTANEOUS
  Filled 2011-04-03: qty 1

## 2011-04-03 NOTE — ED Notes (Signed)
Pt presents with abscesses to L forearm and L wrist.  Pt reports sites occurred x 3 days ago, neither have opened and drained. +fever at home.

## 2011-04-03 NOTE — ED Provider Notes (Signed)
Pt with abscess to forearm and also has ?cyst to wrist but full ROM of wrist, doubt septic joint  Joya Gaskins, MD 04/03/11 [phone]

## 2011-04-03 NOTE — ED Notes (Signed)
Medication (Percocet) frequency clarified by Jaci Carrel PA called to [facility] :  To read one  Tablet by mouth every 4-6 hours for severe pain

## 2011-04-03 NOTE — ED Notes (Signed)
CBG 321 at 15:04

## 2011-05-10 ENCOUNTER — Inpatient Hospital Stay (HOSPITAL_COMMUNITY)
Admission: EM | Admit: 2011-05-10 | Discharge: 2011-05-14 | DRG: 390 | Disposition: A | Discharge status: Home / Self Care | Payer: Medicare Other | Source: Ambulatory Visit | Attending: Family Medicine | Admitting: Family Medicine

## 2011-05-10 ENCOUNTER — Emergency Department (HOSPITAL_COMMUNITY): Payer: Medicare Other

## 2011-05-10 ENCOUNTER — Other Ambulatory Visit: Payer: Self-pay

## 2011-05-10 ENCOUNTER — Encounter (HOSPITAL_COMMUNITY): Payer: Self-pay | Admitting: *Deleted

## 2011-05-10 DIAGNOSIS — E119 Type 2 diabetes mellitus without complications: Secondary | ICD-10-CM | POA: Diagnosis present

## 2011-05-10 DIAGNOSIS — G8929 Other chronic pain: Secondary | ICD-10-CM | POA: Diagnosis present

## 2011-05-10 DIAGNOSIS — Z86718 Personal history of other venous thrombosis and embolism: Secondary | ICD-10-CM

## 2011-05-10 DIAGNOSIS — K56609 Unspecified intestinal obstruction, unspecified as to partial versus complete obstruction: Secondary | ICD-10-CM

## 2011-05-10 DIAGNOSIS — I10 Essential (primary) hypertension: Secondary | ICD-10-CM | POA: Diagnosis present

## 2011-05-10 DIAGNOSIS — M549 Dorsalgia, unspecified: Secondary | ICD-10-CM | POA: Diagnosis present

## 2011-05-10 DIAGNOSIS — F17200 Nicotine dependence, unspecified, uncomplicated: Secondary | ICD-10-CM | POA: Diagnosis present

## 2011-05-10 DIAGNOSIS — Z79899 Other long term (current) drug therapy: Secondary | ICD-10-CM

## 2011-05-10 DIAGNOSIS — E860 Dehydration: Secondary | ICD-10-CM | POA: Diagnosis present

## 2011-05-10 DIAGNOSIS — E876 Hypokalemia: Secondary | ICD-10-CM | POA: Diagnosis present

## 2011-05-10 DIAGNOSIS — R739 Hyperglycemia, unspecified: Secondary | ICD-10-CM

## 2011-05-10 DIAGNOSIS — G894 Chronic pain syndrome: Secondary | ICD-10-CM

## 2011-05-10 LAB — GLUCOSE, CAPILLARY: Glucose-Capillary: 310 mg/dL — ABNORMAL HIGH (ref 70–99)

## 2011-05-10 LAB — HEMOGLOBIN A1C: Mean Plasma Glucose: 318 mg/dL — ABNORMAL HIGH (ref <117)

## 2011-05-10 LAB — MAGNESIUM: Magnesium: 2 mg/dL (ref 1.5–2.5)

## 2011-05-10 LAB — CBC
HCT: 50.3 % (ref 39.0–52.0)
MCH: 31.3 pg (ref 26.0–34.0)
MCV: 94.2 fL (ref 78.0–100.0)
Platelets: 263 10*3/uL (ref 150–400)
RDW: 13.4 % (ref 11.5–15.5)
WBC: 8 10*3/uL (ref 4.0–10.5)

## 2011-05-10 LAB — COMPREHENSIVE METABOLIC PANEL
Albumin: 3.7 g/dL (ref 3.5–5.2)
BUN: 10 mg/dL (ref 6–23)
CO2: 34 mEq/L — ABNORMAL HIGH (ref 19–32)
Calcium: 10.5 mg/dL (ref 8.4–10.5)
Chloride: 95 mEq/L — ABNORMAL LOW (ref 96–112)
Creatinine, Ser: 0.76 mg/dL (ref 0.50–1.35)
GFR calc non Af Amer: >90 mL/min (ref >90)
Total Bilirubin: 0.3 mg/dL (ref 0.3–1.2)

## 2011-05-10 LAB — URINALYSIS, ROUTINE W REFLEX MICROSCOPIC
Ketones, ur: 40 mg/dL — AB
Nitrite: NEGATIVE
Protein, ur: 100 mg/dL — AB
Urobilinogen, UA: 1 mg/dL (ref 0.0–1.0)

## 2011-05-10 LAB — URINE MICROSCOPIC-ADD ON

## 2011-05-10 LAB — TROPONIN I: Troponin I: <0.3 ng/mL

## 2011-05-10 LAB — LIPASE, BLOOD: Lipase: 28 U/L (ref 11–59)

## 2011-05-10 MED ORDER — ONDANSETRON HCL 4 MG/2ML IJ SOLN
4.0000 mg | Freq: Once | INTRAMUSCULAR | Status: AC
  Administered 2011-05-10: 4 mg via INTRAVENOUS
  Filled 2011-05-10: qty 2

## 2011-05-10 MED ORDER — HYDROMORPHONE HCL PF 1 MG/ML IJ SOLN
1.0000 mg | Freq: Once | INTRAMUSCULAR | Status: AC
  Administered 2011-05-10: 1 mg via INTRAVENOUS
  Filled 2011-05-10: qty 1

## 2011-05-10 MED ORDER — ONDANSETRON HCL 4 MG/2ML IJ SOLN: 4.0000 mg | Freq: Four times a day (QID) | INTRAMUSCULAR | Status: DC | PRN

## 2011-05-10 MED ORDER — IOHEXOL 300 MG/ML  SOLN
100.0000 mL | Freq: Once | INTRAMUSCULAR | Status: AC | PRN
  Administered 2011-05-10: 100 mL via INTRAVENOUS

## 2011-05-10 MED ORDER — INFLUENZA VIRUS VACC SPLIT PF IM SUSP
0.5000 mL | INTRAMUSCULAR | Status: AC
  Filled 2011-05-10: qty 0.5

## 2011-05-10 MED ORDER — POTASSIUM CHLORIDE 20 MEQ/15ML (10%) PO LIQD
ORAL | Status: AC
  Administered 2011-05-10: 15:00:00
  Filled 2011-05-10: qty 30

## 2011-05-10 MED ORDER — POTASSIUM CHLORIDE 20 MEQ/15ML (10%) PO LIQD
40.0000 meq | Freq: Once | ORAL | Status: AC
  Administered 2011-05-10: 40 meq via ORAL

## 2011-05-10 MED ORDER — SODIUM CHLORIDE 0.9 % IV BOLUS (SEPSIS)
500.0000 mL | Freq: Once | INTRAVENOUS | Status: AC
  Administered 2011-05-10: 500 mL via INTRAVENOUS

## 2011-05-10 MED ORDER — ONDANSETRON HCL 4 MG/2ML IJ SOLN
4.0000 mg | Freq: Four times a day (QID) | INTRAMUSCULAR | Status: DC | PRN
  Administered 2011-05-13: 4 mg via INTRAVENOUS
  Filled 2011-05-10: qty 2

## 2011-05-10 MED ORDER — POTASSIUM CHLORIDE 10 MEQ/100ML IV SOLN
10.0000 meq | Freq: Once | INTRAVENOUS | Status: AC
  Administered 2011-05-10: 10 meq via INTRAVENOUS
  Filled 2011-05-10: qty 100

## 2011-05-10 MED ORDER — HYDROMORPHONE HCL PF 1 MG/ML IJ SOLN
1.0000 mg | INTRAMUSCULAR | Status: DC | PRN
  Administered 2011-05-10 – 2011-05-11 (×5): 1 mg via INTRAVENOUS
  Filled 2011-05-10 (×7): qty 1

## 2011-05-10 MED ORDER — HEPARIN SODIUM (PORCINE) 5000 UNIT/ML IJ SOLN
5000.0000 [IU] | Freq: Three times a day (TID) | INTRAMUSCULAR | Status: DC
  Administered 2011-05-10 – 2011-05-14 (×10): 5000 [IU] via SUBCUTANEOUS
  Filled 2011-05-10 (×14): qty 1

## 2011-05-10 MED ORDER — PROMETHAZINE HCL 25 MG/ML IJ SOLN
25.0000 mg | Freq: Four times a day (QID) | INTRAMUSCULAR | Status: DC | PRN
  Filled 2011-05-10: qty 1

## 2011-05-10 MED ORDER — INSULIN ASPART 100 UNIT/ML ~~LOC~~ SOLN
0.0000 [IU] | Freq: Three times a day (TID) | SUBCUTANEOUS | Status: DC
  Administered 2011-05-10 – 2011-05-11 (×2): 8 [IU] via SUBCUTANEOUS
  Administered 2011-05-11: 5 [IU] via SUBCUTANEOUS
  Administered 2011-05-11 – 2011-05-12 (×2): 3 [IU] via SUBCUTANEOUS
  Administered 2011-05-13: 5 [IU] via SUBCUTANEOUS
  Administered 2011-05-13: 3 [IU] via SUBCUTANEOUS
  Administered 2011-05-13: 5 [IU] via SUBCUTANEOUS
  Administered 2011-05-14: 8 [IU] via SUBCUTANEOUS
  Filled 2011-05-10 (×3): qty 3

## 2011-05-10 MED ORDER — POTASSIUM CHLORIDE IN NACL 40-0.9 MEQ/L-% IV SOLN
INTRAVENOUS | Status: DC
  Administered 2011-05-10 – 2011-05-11 (×2): via INTRAVENOUS
  Filled 2011-05-10 (×5): qty 1000

## 2011-05-10 MED ORDER — HYDROMORPHONE HCL PF 2 MG/ML IJ SOLN
2.0000 mg | Freq: Once | INTRAMUSCULAR | Status: AC
  Administered 2011-05-10: 2 mg via INTRAVENOUS
  Filled 2011-05-10: qty 1

## 2011-05-10 MED ORDER — PROMETHAZINE HCL 25 MG/ML IJ SOLN: 25.0000 mg | Freq: Four times a day (QID) | INTRAMUSCULAR | Status: DC | PRN

## 2011-05-10 NOTE — ED Notes (Signed)
Abd. Pain x 3 days ago. Not able to keep nothing down - meds, food.  Pain in stomach and legs. Hx. Of blood clots in legs.

## 2011-05-10 NOTE — ED Notes (Signed)
Nurse unable to take report at this time.

## 2011-05-10 NOTE — ED Notes (Signed)
CBG - 310 

## 2011-05-10 NOTE — ED Notes (Signed)
Attempt IV unable paged IV team Patient stated difficulty in the past obtaining an IV

## 2011-05-10 NOTE — H&P (Signed)
Seen and examined earlier this evening.  Reviewed the H, PE, assess plan and management by Drs Mikel Cella and McGill.  Agree with their management.  Briefly, Ryan [name] is vaguely familiar to me.  Issues #1 Abd pain, nausea and vomiting.  Hx of both chronic pain syndrome, recurrent pancreatitis and S/P cholecystectomy.  By CT scan, he has a partial SBO, which fits nicely with his evident dehydration and hypokalemia.  Agree with NG.  He has lots of CO pain (see #2), but I am not concerned with bowel ischemia or a surgical abdomen.  I am also reassured by his normal WBC.  I believe we can hold on a surgery consult. #2 At the time of my visit, he had received a total of 6mg  of iv dilaudid and still was requesting pain additional pain medicines and complaining of severe pain.  Yet, his pulse, BP and my general impression during the interview was that his pain was under control.  We should be cautious about over interpreting his subjective complaints of pain.  Agree with admit, NG drainage, hydration, replete K+ and prudent doses of narcotic analgesics.

## 2011-05-10 NOTE — ED Provider Notes (Signed)
History     CSN: [id number]  Arrival date & time 05/10/11  4098   First MD Initiated Contact with Patient 05/10/11 0901      Chief Complaint  Patient presents with  . Abdominal Pain    (Consider location/radiation/quality/duration/timing/severity/associated sxs/prior treatment) HPI  Pt presents to the ED with complaints of abdominal pain, nausea, vomiting and weakness for 3 days. Abdominal Pain: Patient complains of abdominal pain. The pain is described as burning, and is 10/10 in intensity. Pain is located in the diffusely with out radiation. Onset was 3 days ago. Symptoms have been gradually worsening since. Aggravating factors: none.  Alleviating factors: none. Associated symptoms: anorexia, nausea and vomiting. The patient denies anorexia, belching, constipation, dysuria, fever, frequency, headache and myalgias.     Past Medical History  Diagnosis Date  . Hypertension   . Diabetes mellitus   . DVT (deep venous thrombosis)     History reviewed. No pertinent past surgical history.  No family history on file.  History  Substance Use Topics  . Smoking status: Not on file  . Smokeless tobacco: Not on file  . Alcohol Use: Not on file      Review of Systems  All other systems reviewed and are negative.    Allergies  Lisinopril and Morphine and related  Home Medications   Current Outpatient Rx  Name Route Sig Dispense Refill  . GABAPENTIN 400 MG PO CAPS Oral Take 400 mg by mouth 3 (three) times daily.      Marland Kitchen GLIPIZIDE 10 MG PO TABS Oral Take 10 mg by mouth 2 (two) times daily before a meal.      . METFORMIN HCL 1000 MG PO TABS Oral Take 1,000 mg by mouth 2 (two) times daily with a meal.      . OXYCODONE HCL 30 MG PO TABS Oral Take 30 mg by mouth every 4 (four) hours as needed. For pain     . RANITIDINE HCL 300 MG PO CAPS Oral Take 300 mg by mouth 2 (two) times daily.        BP 146/78  Pulse 70  Temp(Src) 98.4 F (36.9 C) (Oral)  Resp 20  SpO2  89%  Physical Exam  Nursing note and vitals reviewed. Constitutional: He is oriented to person, place, and time. He appears well-developed and well-nourished.       Pt appears dehydrated  HENT:  Head: Normocephalic and atraumatic.  Eyes: Pupils are equal, round, and reactive to light.  Neck: Normal range of motion.  Cardiovascular: Normal rate and regular rhythm.   Pulmonary/Chest: Effort normal and breath sounds normal.  Abdominal: Soft. Bowel sounds are normal. He exhibits no distension. There is tenderness. There is guarding. There is no rebound.  Musculoskeletal: Normal range of motion.  Neurological: He is oriented to person, place, and time.  Skin: Skin is warm and dry. There is pallor.    ED Course  Procedures (including critical care time)  Labs Reviewed  COMPREHENSIVE METABOLIC PANEL - Abnormal; Notable for the following:    Potassium 2.7 (*)    Chloride 95 (*)    CO2 34 (*)    Glucose, Bld 312 (*)    All other components within normal limits  GLUCOSE, CAPILLARY - Abnormal; Notable for the following:    Glucose-Capillary 310 (*)    All other components within normal limits  URINALYSIS, ROUTINE W REFLEX MICROSCOPIC - Abnormal; Notable for the following:    Color, Urine AMBER (*) BIOCHEMICALS MAY BE AFFECTED  BY COLOR   Specific Gravity, Urine 1.034 (*)    Glucose, UA 250 (*)    Bilirubin Urine SMALL (*)    Ketones, ur 40 (*)    Protein, ur 100 (*)    Leukocytes, UA SMALL (*)    All other components within normal limits  URINE MICROSCOPIC-ADD ON - Abnormal; Notable for the following:    Bacteria, UA FEW (*)    All other components within normal limits  CBC  LIPASE, BLOOD  TROPONIN I  POCT CBG MONITORING   Ct Abdomen Pelvis W Contrast  05/10/2011  *RADIOLOGY REPORT*  Clinical Data: Diffuse abdominal tenderness.  Distension, pain for 3 days. Unable to keep food down.  CT ABDOMEN AND PELVIS WITH CONTRAST  Technique:  Multidetector CT imaging of the abdomen and  pelvis was performed following the standard protocol during bolus administration of intravenous contrast.  Contrast: OMNIPAQUE IOHEXOL 300 MG/ML IV SOLN  Comparison: 02/06/2011  Findings: The lung bases are unremarkable.  There is marked dilatation of the proximal small bowel loops. Distal small bowel loops are not dilated.  Colonic loops are normal caliber.  No evidence for mass within the abdomen.  The contrast does reach the transverse colon, contrast has reached the lower colon, excluding complete obstruction.  However, there is at least partial obstruction, favored to be in the mid small bowel. Definite point of transition is not identified but favored to be in the central abdomen.  No focal abnormality identified within the liver, spleen, or pancreas.  Just above the upper pole region of the right kidney, there is a low attenuation lesion, favored to represent a renal cyst.  Alternatively, this could represent a benign right adrenal lesion.  This measures approximately 2 cm in diameter.  The patient has had prior cholecystectomy.  The appendix has a normal appearance. No evidence for aortic aneurysm. No retroperitoneal or mesenteric adenopathy.  Prostatic calcifications are present.  The patient has had prior lower lumbar posterior fusion. Otherwise, the osseous structures have a normal appearance.  There is SI joint sclerosis, left greater than right.  IMPRESSION:  1.  Partial but significant small bowel obstruction, best localized to the mid small bowel loops. Adhesions are favored as a cause.  No evidence for mass.  No abscess or free intraperitoneal air identified. 2.  Status post cholecystectomy. 3.  Status post posterior lumbar fusion. 4.  No evidence for acute appendicitis. 5.  Right upper pole renal cyst versus benign right adrenal lesion.  Original Report Authenticated By: Patterson Hammersmith, M.D.     1. Hyperglycemia   2. Small bowel obstruction   3. Hypokalemia       MDM    Pt to  move to CDU for holding. Fayrene Helper, PA-C will assume care of patient. We are currently awaiting lab results and abd/pelv CT scan. Pt is currently stable and in NAD.       Dorthula Matas, PA 05/10/11 1516

## 2011-05-10 NOTE — ED Notes (Signed)
Received report and assumed patient care. Pt noted alert and oriented and in no acute distress. Pt requesting pain meds.

## 2011-05-10 NOTE — ED Notes (Signed)
Reminded Patient we need a urine sample.  Patient he is working on it.  Patient has a urinal at bedside.

## 2011-05-10 NOTE — ED Notes (Signed)
IV team called back approx 45 60 minute wait.  Another RN will attempt IV

## 2011-05-10 NOTE — ED Notes (Signed)
Critical value K 2.7 reported by lab. EDP notified

## 2011-05-10 NOTE — ED Provider Notes (Signed)
Report received from Marlon Pel, PA-C. 61 year old male with history of hypertension diabetes presenting to the ED with chief complaints of generalized abdominal pain with associated nausea and vomiting for the past 3 days.  He is unable to take his regular medication or food. He is currently being moved to the CDU while waiting for an abdominal CT scan to rule out pancreatitis versus diverticular pathology. Patient is currently in no acute distress, but continued to endorse abdominal pain. Patient has high pain tolerance.  Labs shows a potassium level of 2.7. Potassium supplementation given, EKG and troponin ordered. Patient has an elevated blood sugar of 312. IV fluid given.  2:55 PM Abdominal CT scan shows evidence of small bowel obstruction, which is consistent with patient's present symptoms. At this time, patient is n.p.o. Pain medication given. I will call for admission.  3:29 PM I have consulted with Family Practice. Pt will be admitted for SBO.  Pt admit to Dr. Doralee Albino.    Fayrene Helper, PA-C 05/10/11 1531

## 2011-05-10 NOTE — ED Notes (Signed)
Patient states onset 3 days ago general abdominal pain points to entire abdomen increases on palpation.  Patient states nausea and emesis for 3 days and unable to eat or drink and keep is medication down.  Having intermittent loose stool brown. Abdomen moderate distended full sensation.  Patient has bilateral scars upper extremities from past infection.

## 2011-05-10 NOTE — ED Notes (Signed)
Consult at bedside.

## 2011-05-10 NOTE — H&P (Signed)
Ryan [name] is an 61 y.o. male.   H&P Note Family Medicine Teaching Service Service Pager: [phone]  Chief Complaint: Abdominal pain, N/V HPI: Patient is a 61 yo M with PMH of DM, HTN, DVT and chronic pain who presents with a 3 day history of not being able to tolerate anything PO. Patient is having pain which he describes as a "raw" stabbing pain. When he has vomiting, he has a burning pain in throat and in stomach. Emesis looks like whatever he had just eaten. No blood or bile. He only has nausea with eating, but his pain is constant. Reports having some chills, no fevers. Last BM yesterday which was soft/normal for him. He had some diarrhea last night but only once, no BM today. Has not passed gas today. Denies any other pain other than chronic leg/back pain. States his abdomen is not bloated or distended to him.  Had laparoscopic cholecystectomy 2 years ago at [facility] in [place]. Has history of pancreatitis.  Of note, patient has not been able to take his medications at home. Unable to check his blood sugars at home since his glucometer is broken. (Usually in the 200's. No highs, no lows)  ED course: Patient presented with pain. Required high doses of Dilaudid to control pain. CT of Abd shows partial SBO. Also found to have K of 2.7 repleted in the ED with .  ROS: +N/V, + chills, + abdominal pain No fever, no constipation, no bloody stools  Past Medical History  Diagnosis Date  . Hypertension   . Diabetes mellitus   . DVT (deep venous thrombosis)   Chronic back pain  Surgical history-  Lap chole- 2 years ago Spinal surgery- 2 years ago (done at [facility])  No family history on file. Social History: Lives alone. Separated from wife. Smokes 1/2 ppd. No EtOh or drugs.  Allergies:  Allergies  Allergen Reactions  . Lisinopril Swelling  . Morphine And Related Swelling   Prior to Admission medications   Medication Sig Start Date End Date Taking? Authorizing Provider    gabapentin (NEURONTIN) 400 MG capsule Take 400 mg by mouth 3 (three) times daily.     Yes Historical Provider, MD  glipiZIDE (GLUCOTROL) 10 MG tablet Take 10 mg by mouth 2 (two) times daily before a meal.     Yes Historical Provider, MD  metFORMIN (GLUCOPHAGE) 1000 MG tablet Take 1,000 mg by mouth 2 (two) times daily with a meal.     Yes Historical Provider, MD  oxycodone (ROXICODONE) 30 MG immediate release tablet Take 30 mg by mouth every 4 (four) hours as needed. For pain    Yes Historical Provider, MD  ranitidine (ZANTAC) 300 MG capsule Take 300 mg by mouth 2 (two) times daily.     Yes Historical Provider, MD  Losartan 100mg  daily Alprazolam 1mg  BID  Results for orders placed during the hospital encounter of 05/10/11 (from the past 48 hour(s))  GLUCOSE, CAPILLARY     Status: Abnormal   Collection Time   05/10/11  9:49 AM      Component Value Range Comment   Glucose-Capillary 310 (*) 70 - 99 (mg/dL)   CBC     Status: Normal   Collection Time   05/10/11  9:59 AM      Component Value Range Comment   WBC 8.0  4.0 - 10.5 (K/uL)    RBC 5.34  4.22 - 5.81 (MIL/uL)    Hemoglobin 16.7  13.0 - 17.0 (g/dL)  HCT 50.3  39.0 - 52.0 (%)    MCV 94.2  78.0 - 100.0 (fL)    MCH 31.3  26.0 - 34.0 (pg)    MCHC 33.2  30.0 - 36.0 (g/dL)    RDW 16.1  09.6 - 04.5 (%)    Platelets 263  150 - 400 (K/uL)   COMPREHENSIVE METABOLIC PANEL     Status: Abnormal   Collection Time   05/10/11  9:59 AM      Component Value Range Comment   Sodium 143  135 - 145 (mEq/L)    Potassium 2.7 (*) 3.5 - 5.1 (mEq/L)    Chloride 95 (*) 96 - 112 (mEq/L)    CO2 34 (*) 19 - 32 (mEq/L)    Glucose, Bld 312 (*) 70 - 99 (mg/dL)    BUN 10  6 - 23 (mg/dL)    Creatinine, Ser 4.09  0.50 - 1.35 (mg/dL)    Calcium 81.1  8.4 - 10.5 (mg/dL)    Total Protein 8.2  6.0 - 8.3 (g/dL)    Albumin 3.7  3.5 - 5.2 (g/dL)    AST 10  0 - 37 (U/L)    ALT 11  0 - 53 (U/L)    Alkaline Phosphatase 109  39 - 117 (U/L)    Total Bilirubin 0.3  0.3  - 1.2 (mg/dL)    GFR calc non Af Amer >90  >90 (mL/min)    GFR calc Af Amer >90  >90 (mL/min)   LIPASE, BLOOD     Status: Normal   Collection Time   05/10/11  9:59 AM      Component Value Range Comment   Lipase 28  11 - 59 (U/L)   TROPONIN I     Status: Normal   Collection Time   05/10/11 12:02 PM      Component Value Range Comment   Troponin I <0.30  <0.30 (ng/mL)   URINALYSIS, ROUTINE W REFLEX MICROSCOPIC     Status: Abnormal   Collection Time   05/10/11 12:50 PM      Component Value Range Comment   Color, Urine AMBER (*) YELLOW  BIOCHEMICALS MAY BE AFFECTED BY COLOR   APPearance CLEAR  CLEAR     Specific Gravity, Urine 1.034 (*) 1.005 - 1.030     pH 7.0  5.0 - 8.0     Glucose, UA 250 (*) NEGATIVE (mg/dL)    Hgb urine dipstick NEGATIVE  NEGATIVE     Bilirubin Urine SMALL (*) NEGATIVE     Ketones, ur 40 (*) NEGATIVE (mg/dL)    Protein, ur 914 (*) NEGATIVE (mg/dL)    Urobilinogen, UA 1.0  0.0 - 1.0 (mg/dL)    Nitrite NEGATIVE  NEGATIVE     Leukocytes, UA SMALL (*) NEGATIVE    URINE MICROSCOPIC-ADD ON     Status: Abnormal   Collection Time   05/10/11 12:50 PM      Component Value Range Comment   WBC, UA 11-20  <3 (WBC/hpf)    RBC / HPF 0-2  <3 (RBC/hpf)    Bacteria, UA FEW (*) RARE     Urine-Other MUCOUS PRESENT      Ct Abdomen Pelvis W Contrast  05/10/2011  *RADIOLOGY REPORT*  Clinical Data: Diffuse abdominal tenderness.  Distension, pain for 3 days. Unable to keep food down.  CT ABDOMEN AND PELVIS WITH CONTRAST  Technique:  Multidetector CT imaging of the abdomen and pelvis was performed following the standard protocol during bolus administration of intravenous  contrast.  Contrast: OMNIPAQUE IOHEXOL 300 MG/ML IV SOLN  Comparison: 02/06/2011  Findings: The lung bases are unremarkable.  There is marked dilatation of the proximal small bowel loops. Distal small bowel loops are not dilated.  Colonic loops are normal caliber.  No evidence for mass within the abdomen.  The  contrast does reach the transverse colon, contrast has reached the lower colon, excluding complete obstruction.  However, there is at least partial obstruction, favored to be in the mid small bowel. Definite point of transition is not identified but favored to be in the central abdomen.  No focal abnormality identified within the liver, spleen, or pancreas.  Just above the upper pole region of the right kidney, there is a low attenuation lesion, favored to represent a renal cyst.  Alternatively, this could represent a benign right adrenal lesion.  This measures approximately 2 cm in diameter.  The patient has had prior cholecystectomy.  The appendix has a normal appearance. No evidence for aortic aneurysm. No retroperitoneal or mesenteric adenopathy.  Prostatic calcifications are present.  The patient has had prior lower lumbar posterior fusion. Otherwise, the osseous structures have a normal appearance.  There is SI joint sclerosis, left greater than right.  IMPRESSION:  1.  Partial but significant small bowel obstruction, best localized to the mid small bowel loops. Adhesions are favored as a cause.  No evidence for mass.  No abscess or free intraperitoneal air identified. 2.  Status post cholecystectomy. 3.  Status post posterior lumbar fusion. 4.  No evidence for acute appendicitis. 5.  Right upper pole renal cyst versus benign right adrenal lesion.  Original Report Authenticated By: Patterson Hammersmith, M.D.    ROS  Blood pressure 119/80, pulse 66, temperature 98.4 F (36.9 C), temperature source Oral, resp. rate 20, SpO2 96.00%. Physical Exam  Constitutional: He appears well-developed. No distress.  HENT:  Head: Normocephalic and atraumatic.  Mouth/Throat: Mucous membranes are dry. Abnormal dentition. Dental caries present.  Eyes: Pupils are equal, round, and reactive to light.  Cardiovascular: Normal rate and regular rhythm.   No murmur heard. Respiratory: No respiratory distress. He has no  wheezes.  GI: He exhibits no ascites. Bowel sounds are decreased. There is tenderness (Diffusely tender. Increased tenderness to palpation superior to umbilicus.). There is no rigidity.  Musculoskeletal: Normal range of motion. He exhibits no edema.  Lymphadenopathy:    He has no cervical adenopathy.  Neurological: He is alert. No cranial nerve deficit or sensory deficit.  Skin: Skin is warm and dry. No rash noted. He is not diaphoretic.     Assessment/Plan 61 yo male with PMH of DM, HTN, DVT, and chronic pain presenting with abdominal pain, N/V found to have partial small bowel obstruction. 1. SBO: 3 day history of nausea and vomiting. Unable to tolerate any PO. CT scan showed partial small bowel obstruction.  - Admit to telemetry given his cardiac history and hypokalemia  - NPO, including medications  - NGT placement with low intermittent suction  - For pain, will manage with Dilaudid. Required high doses in the ED and takes chronic pain medication. Will start with Dilaudid 1mg  q 3 hours, and increase dose if needed  - IVF NS +76mEq Kcl at 125cc/hr for 24 hours. Then switch to normal saline.  - Add on Amylase and Lipase to labs already drawn. Will check Cmet and CBC in the AM.  - If patient gets acutely worse, has elevated WBC tomorrow, or if he does not improve, will  consider calling surgery at that time. For now, will manage medically with bowel rest.  2. HTN: Stable. Patient has not taken his blood pressure medications recently but his BP 118/90 on admission.   - Holding home PO medications.  - Continue to monitor. If patient has elevated BP, can add IV medication prn for hypertension.  3. DM: On oral hypoglycemics at home. Currently holding these due to NPO status  - HgB A1C pending  - Moderate SSI   4. Hypokalemia: K+ 2.7 in ED. Received repletion in the ED.  - Keep on telemetry. EKG in the AM  - KCl in IVF for 24 hours  - Recheck Bmet in the AM  5. Chronic pain: On Oxycodone  30mg  q6 hours at home for chronic back and leg pain  - On Dilaudid, but may require higher doses given the amount of pain medication he takes on a daily basis.  - Continue to monitor  6. FEN/GI- NPO, NGT,  NS + 40 KCl @ 125cc/hr 7. PPx- Heparin SQ 8. Dispo: Pending clinical improvement and tolerating PO.  HAIRFORD, AMBER 05/10/2011, 3:54 PM   PGY-2 ADDENDUM I have seen and examined the patient and agree with Dr. Algis Downs note.  For full details, please see excellent PGY-1 note. Briefly, this is a 61 yo AA M who p/w 3 days of nausea and vomiting as well as gnawing, stabbing abdominal pain worst in the LUQ.  Patient has been unable to eat or take any medicines x3 days. His emesis has been NBNB.  His last bowel movement was yesterday afternoon and was soft, without blood or melena.  He then had one episode of diarrhea last night.  He has not had a bowel movement or flatus today.  He has a history of cholecystectomy 2-3 years ago and has had pancreatitis in the past.  He has not had any fevers but endorses some chills.  No chest pain or SOB.   CT abdomen shows partial SBO.  O:  Filed Vitals:   05/10/11 1530  BP:   Pulse: 66  Temp:   Resp:   BP 119/80 O2: 98% on 3L [place]  Gen: NAD, talkative, joking HEENT: slightly dry MM, no pharyngeal erythema, no cervical LAD, EOMI, no scleral icterus CV: RRR, no murmur Pulm: Clear bilaterally, no wheezes Abd: Soft, tenderness to palpation greatest in LUQ, no rebound tenderness, hypoactive bowel sounds  Ext: warm, no edema  Labs and imaging: as above, significantly: WBC 8, K 2.7, Glucose 312  A/P: 61 yo M p/w 3 day history of nausea and vomiting found to have partial small bowel obstruction without significant pain or leukocytosis  1. SBO: Likely cause of N/V/abd pain -Admit to telemetry -NG tube to intermittent suction -Bowel rest -NPO, hydrate with IVF -Will check amylase and lipase since pt with h/o pancreatitis -Will hold off on  consulting surgery, but consider if patient's pain increases or WBC is significantly elevated on AM labs  2. HTN: Patient reports being on losartan 100mg  daily at home, but has not taken in 3 days 2/2 nausea -Currently normotensive -Monitor BPs, consider adding IV medication (ex hydralazine) if needed over night while pt is NPO  3. DM: Patient on glipizide and metformin, hold home medicines while NPO -Place on SSI   4. CAD: Patient with h/o CAD and given K of 2.7, will admit to telemetry to monitor for arrhythmias   5. Hypokalemia: Patient given KCl IV in ED -Will add K to fluids -Recheck  CMET in AM and monitor   6. Chronic Pain: Patient on oxycodone at home and has high pain tolerance (recieved 6mg  dilaudid in ED in short time interval) -Will start with Dilaudid 1mg  q3hr IV, consider increasing to 1mg  q2hr  7. FEN/GI: NPO, NS + 40 KCl @ 125cc/hr  8. Ppx: SQ heparin   9. Dispo: pending clinical improvement    MCGILL,JACQUELYN 05/10/2011  5:04 PM

## 2011-05-10 NOTE — ED Notes (Signed)
Pain currently 7/10 achy sharp abdominal pain.  States usually needs more Dilaudid.  Patient requested 2 mg additional.

## 2011-05-10 NOTE — ED Notes (Signed)
3013-01 Ready 

## 2011-05-10 NOTE — ED Provider Notes (Signed)
Medical screening examination/treatment/procedure(s) were conducted as a shared visit with non-physician practitioner(s) and myself.  I personally evaluated the patient during the encounter Patient with abdominal pain concerning for acute abdominal pathology. May be pancreatitis versus other abdominal pathology. Normal vital signs and blood sugar 300. CT pending.  Gwyneth Sprout, MD 05/10/11 [phone]

## 2011-05-11 ENCOUNTER — Encounter (HOSPITAL_COMMUNITY): Payer: Self-pay | Admitting: *Deleted

## 2011-05-11 LAB — COMPREHENSIVE METABOLIC PANEL
ALT: 10 U/L (ref 0–53)
AST: 10 U/L (ref 0–37)
Albumin: 3.2 g/dL — ABNORMAL LOW (ref 3.5–5.2)
Alkaline Phosphatase: 88 U/L (ref 39–117)
Potassium: 3 mEq/L — ABNORMAL LOW (ref 3.5–5.1)
Sodium: 144 mEq/L (ref 135–145)
Total Protein: 7.2 g/dL (ref 6.0–8.3)

## 2011-05-11 LAB — CBC
HCT: 46.5 % (ref 39.0–52.0)
MCH: 31.1 pg (ref 26.0–34.0)
MCHC: 32.7 g/dL (ref 30.0–36.0)
MCV: 95.3 fL (ref 78.0–100.0)
RDW: 13.7 % (ref 11.5–15.5)

## 2011-05-11 LAB — GLUCOSE, CAPILLARY

## 2011-05-11 MED ORDER — POTASSIUM CHLORIDE IN NACL 20-0.9 MEQ/L-% IV SOLN
INTRAVENOUS | Status: DC
  Administered 2011-05-11: 12:00:00 via INTRAVENOUS
  Filled 2011-05-11 (×2): qty 1000

## 2011-05-11 MED ORDER — KETOROLAC TROMETHAMINE 30 MG/ML IJ SOLN
30.0000 mg | Freq: Three times a day (TID) | INTRAMUSCULAR | Status: DC | PRN
Start: 2011-05-11 — End: 2011-05-12
  Administered 2011-05-11 – 2011-05-12 (×2): 30 mg via INTRAVENOUS
  Filled 2011-05-11 (×2): qty 1

## 2011-05-11 MED ORDER — INSULIN GLARGINE 100 UNIT/ML ~~LOC~~ SOLN
5.0000 [IU] | Freq: Every day | SUBCUTANEOUS | Status: DC
  Administered 2011-05-11 – 2011-05-12 (×2): 5 [IU] via SUBCUTANEOUS
  Filled 2011-05-11 (×2): qty 3

## 2011-05-11 MED ORDER — POTASSIUM CHLORIDE 10 MEQ/100ML IV SOLN
10.0000 meq | INTRAVENOUS | Status: DC
  Filled 2011-05-11 (×3): qty 100

## 2011-05-11 MED ORDER — HYDROMORPHONE HCL PF 1 MG/ML IJ SOLN
2.0000 mg | INTRAMUSCULAR | Status: DC | PRN
  Administered 2011-05-11: 2 mg via INTRAVENOUS
  Filled 2011-05-11: qty 2

## 2011-05-11 MED ORDER — WHITE PETROLATUM GEL
Status: AC
  Filled 2011-05-11: qty 5

## 2011-05-11 MED ORDER — METOCLOPRAMIDE HCL 5 MG/ML IJ SOLN
5.0000 mg | Freq: Two times a day (BID) | INTRAMUSCULAR | Status: DC
  Administered 2011-05-11 (×2): 5 mg via INTRAVENOUS
  Filled 2011-05-11 (×4): qty 1

## 2011-05-11 MED ORDER — POTASSIUM CHLORIDE IN NACL 40-0.9 MEQ/L-% IV SOLN
INTRAVENOUS | Status: DC
  Administered 2011-05-11 – 2011-05-12 (×2): via INTRAVENOUS
  Filled 2011-05-11 (×5): qty 1000

## 2011-05-11 MED ORDER — HYDROMORPHONE HCL PF 1 MG/ML IJ SOLN
1.0000 mg | INTRAMUSCULAR | Status: DC | PRN
  Administered 2011-05-11 – 2011-05-12 (×4): 1 mg via INTRAVENOUS
  Filled 2011-05-11 (×4): qty 1

## 2011-05-11 MED ORDER — PANTOPRAZOLE SODIUM 40 MG IV SOLR
40.0000 mg | Freq: Every day | INTRAVENOUS | Status: DC
  Administered 2011-05-11: 40 mg via INTRAVENOUS
  Filled 2011-05-11 (×2): qty 40

## 2011-05-11 NOTE — Progress Notes (Signed)
Subjective:    Patient ID: Ryan [name], male    DOB: 22-Mar-1951, 61 y.o.   MRN: [id number]  Abdominal Pain  Patient is a 61 yo M with PMH of DM, HTN, DVT and chronic pain who presents with a 3 day history of not being able to tolerate anything PO. Patient found to have a likely partial small bowel obstruction on CT scan. Patient states that he is still having significant pain but does have an appetite. Patient does have an NG tube which has still had significant amount of output this morning. Patient does state he has had positive flatus and positive bowel movement. Patient states that he is still having significant pain abdominal region stabbing in nature does not feel the IV narcotics have helped significantly.     Review of Systems  Gastrointestinal: Positive for abdominal pain.   denies fever, chills, nausea or vomiting. Results for orders placed during the hospital encounter of 05/10/11 (from the past 24 hour(s))  TROPONIN I     Status: Normal   Collection Time   05/10/11 12:02 PM      Component Value Range   Troponin I <0.30  <0.30 (ng/mL)  URINALYSIS, ROUTINE W REFLEX MICROSCOPIC     Status: Abnormal   Collection Time   05/10/11 12:50 PM      Component Value Range   Color, Urine AMBER (*) YELLOW    APPearance CLEAR  CLEAR    Specific Gravity, Urine 1.034 (*) 1.005 - 1.030    pH 7.0  5.0 - 8.0    Glucose, UA 250 (*) NEGATIVE (mg/dL)   Hgb urine dipstick NEGATIVE  NEGATIVE    Bilirubin Urine SMALL (*) NEGATIVE    Ketones, ur 40 (*) NEGATIVE (mg/dL)   Protein, ur 409 (*) NEGATIVE (mg/dL)   Urobilinogen, UA 1.0  0.0 - 1.0 (mg/dL)   Nitrite NEGATIVE  NEGATIVE    Leukocytes, UA SMALL (*) NEGATIVE   URINE MICROSCOPIC-ADD ON     Status: Abnormal   Collection Time   05/10/11 12:50 PM      Component Value Range   WBC, UA 11-20  <3 (WBC/hpf)   RBC / HPF 0-2  <3 (RBC/hpf)   Bacteria, UA FEW (*) RARE    Urine-Other MUCOUS PRESENT    GLUCOSE, CAPILLARY     Status: Abnormal   Collection Time   05/10/11  6:13 PM      Component Value Range   Glucose-Capillary 258 (*) 70 - 99 (mg/dL)  MAGNESIUM     Status: Normal   Collection Time   05/10/11  6:36 PM      Component Value Range   Magnesium 2.0  1.5 - 2.5 (mg/dL)  PHOSPHORUS     Status: Normal   Collection Time   05/10/11  6:36 PM      Component Value Range   Phosphorus 3.8  2.3 - 4.6 (mg/dL)  AMYLASE     Status: Normal   Collection Time   05/10/11  6:36 PM      Component Value Range   Amylase 33  0 - 105 (U/L)  HEMOGLOBIN A1C     Status: Abnormal   Collection Time   05/10/11  6:36 PM      Component Value Range   Hemoglobin A1C 12.7 (*) <5.7 (%)   Mean Plasma Glucose 318 (*) <117 (mg/dL)  GLUCOSE, CAPILLARY     Status: Abnormal   Collection Time   05/10/11 10:57 PM  Component Value Range   Glucose-Capillary 228 (*) 70 - 99 (mg/dL)   Comment 1 Documented in Chart     Comment 2 Notify RN    COMPREHENSIVE METABOLIC PANEL     Status: Abnormal   Collection Time   05/11/11  6:00 AM      Component Value Range   Sodium 144  135 - 145 (mEq/L)   Potassium 3.0 (*) 3.5 - 5.1 (mEq/L)   Chloride 99  96 - 112 (mEq/L)   CO2 35 (*) 19 - 32 (mEq/L)   Glucose, Bld 261 (*) 70 - 99 (mg/dL)   BUN 11  6 - 23 (mg/dL)   Creatinine, Ser 4.54  0.50 - 1.35 (mg/dL)   Calcium 9.2  8.4 - 09.8 (mg/dL)   Total Protein 7.2  6.0 - 8.3 (g/dL)   Albumin 3.2 (*) 3.5 - 5.2 (g/dL)   AST 10  0 - 37 (U/L)   ALT 10  0 - 53 (U/L)   Alkaline Phosphatase 88  39 - 117 (U/L)   Total Bilirubin 0.3  0.3 - 1.2 (mg/dL)   GFR calc non Af Amer >90  >90 (mL/min)   GFR calc Af Amer >90  >90 (mL/min)  CBC     Status: Normal   Collection Time   05/11/11  6:00 AM      Component Value Range   WBC 6.5  4.0 - 10.5 (K/uL)   RBC 4.88  4.22 - 5.81 (MIL/uL)   Hemoglobin 15.2  13.0 - 17.0 (g/dL)   HCT 11.9  14.7 - 82.9 (%)   MCV 95.3  78.0 - 100.0 (fL)   MCH 31.1  26.0 - 34.0 (pg)   MCHC 32.7  30.0 - 36.0 (g/dL)   RDW 56.2  13.0 - 86.5 (%)    Platelets 245  150 - 400 (K/uL)       Objective:   Physical Exam Constitutional: He appears well-developed. No distress.  Mouth/Throat: Mucous membranes are dry.  Cardiovascular: Normal rate and regular rhythm.  No murmur heard.  Respiratory: No respiratory distress. He has no wheezes.  GI: He exhibits no ascites. Bowel sounds are noted and normal. There is tenderness (Diffusely tender. There is no rigidity. No involuntary guarding  Musculoskeletal: Normal range of motion. He exhibits no edema.  Neurological: He is alert. No cranial nerve deficit or sensory deficit.  Skin: Skin is warm and dry. No rash noted. He is not diaphoretic.     Assessment & Plan:  61 yo male with PMH of DM, HTN, DVT, and chronic pain presenting with partial small bowel obstruction.  1. SBO: Patient has had a bowel movement now and does have increasing bowel sounds. Patient does have a good appetite but still not allowing patient to take by mouth intake due to the amount of fluid the NG tube is still accepting  - NPO, including medications still for now we'll continue to monitor NG output  - For pain, will manage with Dilaudid patient was on is considerable amount of oxycodone at outpatient. Will increase dose to 2 mg every 4 hours as needed. - Amylase and lipase normal no signs of pancreatitis.  - Still can hold on surgery consult for now.  2. HTN: Stable. Patient has not taken his blood pressure medications recently but his BP 118/90 on admission.  - Holding home PO medications.  - Continue to monitor. If patient has elevated BP, can add IV hydralazine medication prn for hypertension with systolic blood pressures greater  than 180.   3. DM: On oral hypoglycemics at home. Patient's A1c was 12.7%. Patient likely will need to have increasing medications at home or consider starting Lantus therapy. We'll discuss with patient at a later date.  4. Hypokalemia: No EKG changes patient's potassium today is 3.0 - Will  give KCl IV runs x3 recheck in a.m. likely low secondary to patient's nausea and vomiting as well as NG tube suction.  5. Chronic pain: On Oxycodone 30mg  q6 hours at home for chronic back and leg pain  - On Dilaudid, increasing dose to 2 mg every 4 hours - Continue to monitor  6. FEN/GI- NPO, NGT, NS + 20 kcl @ 125cc/hr  7. PPx- Heparin SQ  8. Dispo: Pending clinical improvement and tolerating PO.

## 2011-05-11 NOTE — Progress Notes (Signed)
I interviewed and examined this patient and discussed the care plan with Dr. Rivka Safer and the [facility] team and agree with assessment and plan as documented in the admission note for today. His abdomen was soft with normal BS at the time of my visit. The continues to be significant NG suction drainage. His high narcotic dose may add ileus to his obstructive symptoms. No hernias were palpable.     Aadya Kindler A. Sheffield Slider, MD Family Medicine Teaching Service Attending  05/11/2011 7:46 PM

## 2011-05-11 NOTE — ED Provider Notes (Signed)
Medical screening examination/treatment/procedure(s) were conducted as a shared visit with non-physician practitioner(s) and myself.  I personally evaluated the patient during the encounter.   CT scan shows SBO.  Admit  Donnetta Hutching, MD 05/11/11 [phone]

## 2011-05-12 ENCOUNTER — Inpatient Hospital Stay (HOSPITAL_COMMUNITY): Payer: Medicare Other

## 2011-05-12 DIAGNOSIS — K56609 Unspecified intestinal obstruction, unspecified as to partial versus complete obstruction: Secondary | ICD-10-CM

## 2011-05-12 LAB — GLUCOSE, CAPILLARY
Glucose-Capillary: 193 mg/dL — ABNORMAL HIGH (ref 70–99)
Glucose-Capillary: 167 mg/dL — ABNORMAL HIGH (ref 70–99)

## 2011-05-12 LAB — CBC
HCT: 47.9 % (ref 39.0–52.0)
Hemoglobin: 15.4 g/dL (ref 13.0–17.0)
Hemoglobin: 14.9 g/dL (ref 13.0–17.0)
Platelets: 232 10*3/uL (ref 150–400)
RBC: 4.81 MIL/uL (ref 4.22–5.81)
RDW: 13.8 % (ref 11.5–15.5)
WBC: 7.6 10*3/uL (ref 4.0–10.5)
WBC: 8 10*3/uL (ref 4.0–10.5)

## 2011-05-12 LAB — COMPREHENSIVE METABOLIC PANEL
ALT: 10 U/L (ref 0–53)
Alkaline Phosphatase: 85 U/L (ref 39–117)
CO2: 36 mEq/L — ABNORMAL HIGH (ref 19–32)
GFR calc Af Amer: >90 mL/min (ref >90)
GFR calc non Af Amer: >90 mL/min (ref >90)
Glucose, Bld: 242 mg/dL — ABNORMAL HIGH (ref 70–99)
Potassium: 3.3 mEq/L — ABNORMAL LOW (ref 3.5–5.1)
Sodium: 150 mEq/L — ABNORMAL HIGH (ref 135–145)

## 2011-05-12 LAB — GASTRIC OCCULT BLOOD (1-CARD TO LAB): Occult Blood, Gastric: POSITIVE — AB

## 2011-05-12 MED ORDER — PROMETHAZINE HCL 25 MG/ML IJ SOLN: 12.5000 mg | Freq: Four times a day (QID) | INTRAMUSCULAR | Status: DC | PRN

## 2011-05-12 MED ORDER — PANTOPRAZOLE SODIUM 40 MG IV SOLR
40.0000 mg | Freq: Two times a day (BID) | INTRAVENOUS | Status: DC
  Filled 2011-05-12 (×2): qty 40

## 2011-05-12 MED ORDER — SODIUM CHLORIDE 0.9 % IV SOLN
80.0000 mg | Freq: Two times a day (BID) | INTRAVENOUS | Status: DC
  Administered 2011-05-12 – 2011-05-13 (×3): 80 mg via INTRAVENOUS
  Filled 2011-05-12 (×5): qty 80

## 2011-05-12 MED ORDER — SODIUM CHLORIDE 0.45 % IV SOLN
INTRAVENOUS | Status: DC
  Administered 2011-05-12 – 2011-05-13 (×4): via INTRAVENOUS
  Filled 2011-05-12 (×8): qty 1000

## 2011-05-12 MED ORDER — FENTANYL CITRATE 0.05 MG/ML IJ SOLN
12.5000 ug | Freq: Once | INTRAMUSCULAR | Status: AC
  Administered 2011-05-12: 12.5 ug via INTRAVENOUS
  Filled 2011-05-12: qty 2

## 2011-05-12 MED ORDER — SODIUM CHLORIDE 0.9 % IV BOLUS (SEPSIS)
1000.0000 mL | Freq: Once | INTRAVENOUS | Status: AC
  Administered 2011-05-12: 1000 mL via INTRAVENOUS

## 2011-05-12 MED ORDER — HYDROMORPHONE HCL PF 1 MG/ML IJ SOLN
1.0000 mg | INTRAMUSCULAR | Status: DC | PRN
  Administered 2011-05-12: 2 mg via INTRAVENOUS
  Administered 2011-05-12: 1 mg via INTRAVENOUS
  Administered 2011-05-12 – 2011-05-13 (×4): 2 mg via INTRAVENOUS
  Administered 2011-05-13: 1 mg via INTRAVENOUS
  Administered 2011-05-13 (×2): 2 mg via INTRAVENOUS
  Filled 2011-05-12 (×5): qty 2
  Filled 2011-05-12: qty 1
  Filled 2011-05-12 (×5): qty 2
  Filled 2011-05-12: qty 1
  Filled 2011-05-12 (×3): qty 2

## 2011-05-12 MED ORDER — IOHEXOL 300 MG/ML  SOLN
100.0000 mL | Freq: Once | INTRAMUSCULAR | Status: AC | PRN
  Administered 2011-05-12: 100 mL via INTRAVENOUS

## 2011-05-12 NOTE — Progress Notes (Signed)
Transfered pt to stepdown unit.

## 2011-05-12 NOTE — Progress Notes (Signed)
Notified DR Katrinka Blazing (MD on call ) of positive gastric occult test. Will continue to monitor while awaiting new orders, if any.

## 2011-05-12 NOTE — Progress Notes (Signed)
I interviewed and examined this patient and discussed the care plan with Dr. Katrinka Blazing and the [facility] team and agree with assessment and plan as documented in the progress note for today. He continues dark NG drainage. We will repeat the Hemoglobin this afternoon and consult GI if it is dropping. Surgery wants to continue the NG tube. He has had bowel movements, but the pain continues despite PPI and NG suction output remains higher that would be expected with no obstruction. Since he had no prior ulcer or GERD symptoms and the blood appeared the day after NG tube placement, mucosal injury from the tube may be causing the bleeding.     Ameah Chanda A. Sheffield Slider, MD Family Medicine Teaching Service Attending  05/12/2011 2:11 PM

## 2011-05-12 NOTE — Progress Notes (Signed)
Pt is c/o excruciating abdominal pain and sts medications he's been getting so far "are not even touching the pain". MD on call notified and he came to see pt. Pt is still c/o pain and sts only thing that  Will help him is 2 mg's of Dilaudid. Upon administering this pt is asking this RN to flush after with saline so "it will work faster". Will continue to closely observe patient. At this time his O2 sats are 94% on 3L of O2

## 2011-05-12 NOTE — Consult Note (Addendum)
Reason for Consult: Small bowel obstruction Referring Physician: Dr. Riki [name] is an 61 y.o. male.  HPI: Patient was admitted to the teaching service with signs and symptoms of small bowel obstruction. Nasogastric tube was placed. Initial CT scan showed partial small bowel obstruction. The patient initially improved. Over the past 24 hours however, the patient had increased abdominal pain and increased output from his nasogastric tube. He had a repeat CT scan ordered, and we were asked to see him for surgical consultation. Since that time a CT scan was done. This demonstrates near-complete resolution of a small bowel obstruction. He is also had at least 2 bowel movements. He continues to have some crampy midabdominal pain.  Past Medical History  Diagnosis Date  . Hypertension   . Diabetes mellitus   . DVT (deep venous thrombosis)     Past surgical history - lap chole  History reviewed. No pertinent family history.  Social History:  reports that he has been smoking Cigarettes.  He has a 7.5 pack-year smoking history. He does not have any smokeless tobacco history on file. He reports that he does not drink alcohol or use illicit drugs.  Allergies:  Allergies  Allergen Reactions  . Lisinopril Swelling  . Morphine And Related Swelling    Medications: I have reviewed the patient's current medications.  Results for orders placed during the hospital encounter of 05/10/11 (from the past 48 hour(s))  URINALYSIS, ROUTINE W REFLEX MICROSCOPIC     Status: Abnormal   Collection Time   05/10/11 12:50 PM      Component Value Range Comment   Color, Urine AMBER (*) YELLOW  BIOCHEMICALS MAY BE AFFECTED BY COLOR   APPearance CLEAR  CLEAR     Specific Gravity, Urine 1.034 (*) 1.005 - 1.030     pH 7.0  5.0 - 8.0     Glucose, UA 250 (*) NEGATIVE (mg/dL)    Hgb urine dipstick NEGATIVE  NEGATIVE     Bilirubin Urine SMALL (*) NEGATIVE     Ketones, ur 40 (*) NEGATIVE (mg/dL)    Protein, ur 657 (*) NEGATIVE (mg/dL)    Urobilinogen, UA 1.0  0.0 - 1.0 (mg/dL)    Nitrite NEGATIVE  NEGATIVE     Leukocytes, UA SMALL (*) NEGATIVE    URINE MICROSCOPIC-ADD ON     Status: Abnormal   Collection Time   05/10/11 12:50 PM      Component Value Range Comment   WBC, UA 11-20  <3 (WBC/hpf)    RBC / HPF 0-2  <3 (RBC/hpf)    Bacteria, UA FEW (*) RARE     Urine-Other MUCOUS PRESENT     GLUCOSE, CAPILLARY     Status: Abnormal   Collection Time   05/10/11  6:13 PM      Component Value Range Comment   Glucose-Capillary 258 (*) 70 - 99 (mg/dL)   MAGNESIUM     Status: Normal   Collection Time   05/10/11  6:36 PM      Component Value Range Comment   Magnesium 2.0  1.5 - 2.5 (mg/dL)   PHOSPHORUS     Status: Normal   Collection Time   05/10/11  6:36 PM      Component Value Range Comment   Phosphorus 3.8  2.3 - 4.6 (mg/dL)   AMYLASE     Status: Normal   Collection Time   05/10/11  6:36 PM      Component Value Range Comment  Amylase 33  0 - 105 (U/L)   HEMOGLOBIN A1C     Status: Abnormal   Collection Time   05/10/11  6:36 PM      Component Value Range Comment   Hemoglobin A1C 12.7 (*) <5.7 (%)    Mean Plasma Glucose 318 (*) <117 (mg/dL)   GLUCOSE, CAPILLARY     Status: Abnormal   Collection Time   05/10/11 10:57 PM      Component Value Range Comment   Glucose-Capillary 228 (*) 70 - 99 (mg/dL)    Comment 1 Documented in Chart      Comment 2 Notify RN     COMPREHENSIVE METABOLIC PANEL     Status: Abnormal   Collection Time   05/11/11  6:00 AM      Component Value Range Comment   Sodium 144  135 - 145 (mEq/L)    Potassium 3.0 (*) 3.5 - 5.1 (mEq/L)    Chloride 99  96 - 112 (mEq/L)    CO2 35 (*) 19 - 32 (mEq/L)    Glucose, Bld 261 (*) 70 - 99 (mg/dL)    BUN 11  6 - 23 (mg/dL)    Creatinine, Ser 1.61  0.50 - 1.35 (mg/dL)    Calcium 9.2  8.4 - 10.5 (mg/dL)    Total Protein 7.2  6.0 - 8.3 (g/dL)    Albumin 3.2 (*) 3.5 - 5.2 (g/dL)    AST 10  0 - 37 (U/L)    ALT 10  0 - 53  (U/L)    Alkaline Phosphatase 88  39 - 117 (U/L)    Total Bilirubin 0.3  0.3 - 1.2 (mg/dL)    GFR calc non Af Amer >90  >90 (mL/min)    GFR calc Af Amer >90  >90 (mL/min)   CBC     Status: Normal   Collection Time   05/11/11  6:00 AM      Component Value Range Comment   WBC 6.5  4.0 - 10.5 (K/uL)    RBC 4.88  4.22 - 5.81 (MIL/uL)    Hemoglobin 15.2  13.0 - 17.0 (g/dL)    HCT 09.6  04.5 - 40.9 (%)    MCV 95.3  78.0 - 100.0 (fL)    MCH 31.1  26.0 - 34.0 (pg)    MCHC 32.7  30.0 - 36.0 (g/dL)    RDW 81.1  91.4 - 78.2 (%)    Platelets 245  150 - 400 (K/uL)   GLUCOSE, CAPILLARY     Status: Abnormal   Collection Time   05/11/11  6:57 AM      Component Value Range Comment   Glucose-Capillary 256 (*) 70 - 99 (mg/dL)    Comment 1 Documented in Chart      Comment 2 Notify RN     GLUCOSE, CAPILLARY     Status: Abnormal   Collection Time   05/11/11 11:52 AM      Component Value Range Comment   Glucose-Capillary 240 (*) 70 - 99 (mg/dL)   GLUCOSE, CAPILLARY     Status: Abnormal   Collection Time   05/11/11  4:50 PM      Component Value Range Comment   Glucose-Capillary 197 (*) 70 - 99 (mg/dL)   GLUCOSE, CAPILLARY     Status: Abnormal   Collection Time   05/11/11  9:25 PM      Component Value Range Comment   Glucose-Capillary 180 (*) 70 - 99 (mg/dL)    Comment 1  Documented in Chart      Comment 2 Notify RN     POCT GASTRIC OCCULT BLOOD     Status: Abnormal   Collection Time   05/12/11  4:22 AM      Component Value Range Comment   pH, Gastric NOT DONE      Occult Blood, Gastric POSITIVE (*) NEGATIVE    MRSA PCR SCREENING     Status: Normal   Collection Time   05/12/11  7:06 AM      Component Value Range Comment   MRSA by PCR NEGATIVE  NEGATIVE    LACTIC ACID, PLASMA     Status: Normal   Collection Time   05/12/11  8:08 AM      Component Value Range Comment   Lactic Acid, Venous 1.7  0.5 - 2.2 (mmol/L)   LIPASE, BLOOD     Status: Normal   Collection Time   05/12/11  8:11 AM       Component Value Range Comment   Lipase 29  11 - 59 (U/L)   CBC     Status: Normal   Collection Time   05/12/11  8:11 AM      Component Value Range Comment   WBC 7.6  4.0 - 10.5 (K/uL)    RBC 4.91  4.22 - 5.81 (MIL/uL)    Hemoglobin 15.4  13.0 - 17.0 (g/dL)    HCT 16.1  09.6 - 04.5 (%)    MCV 97.6  78.0 - 100.0 (fL)    MCH 31.4  26.0 - 34.0 (pg)    MCHC 32.2  30.0 - 36.0 (g/dL)    RDW 40.9  81.1 - 91.4 (%)    Platelets 234  150 - 400 (K/uL)   COMPREHENSIVE METABOLIC PANEL     Status: Abnormal   Collection Time   05/12/11  8:12 AM      Component Value Range Comment   Sodium 150 (*) 135 - 145 (mEq/L)    Potassium 3.3 (*) 3.5 - 5.1 (mEq/L)    Chloride 102  96 - 112 (mEq/L)    CO2 36 (*) 19 - 32 (mEq/L)    Glucose, Bld 242 (*) 70 - 99 (mg/dL)    BUN 16  6 - 23 (mg/dL)    Creatinine, Ser 7.82  0.50 - 1.35 (mg/dL)    Calcium 9.1  8.4 - 10.5 (mg/dL)    Total Protein 7.2  6.0 - 8.3 (g/dL)    Albumin 3.3 (*) 3.5 - 5.2 (g/dL)    AST 10  0 - 37 (U/L)    ALT 10  0 - 53 (U/L)    Alkaline Phosphatase 85  39 - 117 (U/L)    Total Bilirubin 0.4  0.3 - 1.2 (mg/dL)    GFR calc non Af Amer >90  >90 (mL/min)    GFR calc Af Amer >90  >90 (mL/min)     Ct Abdomen Pelvis W Contrast  05/12/2011  *RADIOLOGY REPORT*  Clinical Data: Worsening symptoms of small bowel obstruction. Diabetes.  Cholecystectomy.  Hypertension.  CT ABDOMEN AND PELVIS WITH CONTRAST  Technique:  Multidetector CT imaging of the abdomen and pelvis was performed following the standard protocol during bolus administration of intravenous contrast.  Contrast: OMNIPAQUE IOHEXOL 300 MG/ML IV SOLN 05/10/2011  Comparison: 05/10/2011  Findings: Clear lung bases.  Normal heart size without pericardial or pleural effusion.  Normal liver.  A small splenule.  The proximal stomach is underdistended, with a nasogastric tube terminating in  the gastric antrum.  The greater curvatures of the stomach is borderline thickened, possibly due to  underdistension.  Normal pancreas. Cholecystectomy without biliary ductal dilatation. Right  upper pole right renal cyst again identified.  A left renal lesion measures 19 - 20 HU and is likely a minimally complex cyst. Approximately 1 cm.  Too small to characterize left renal lesion as well.  Small retroperitoneal nodes are not pathologic by size criteria.  The colon is normal in caliber.  Normal terminal ileum and appendix.  The small bowel obstruction pattern is improved.  Air fluid levels within the small bowel loops measuring up to 2.9 cm on image 42.  Normal mid-disease distal small bowel caliber, with contrast identified to the colon.  No mesenteric abnormality identified.  No small bowel wall thickening, pneumatosis, or free intraperitoneal air. No ascites.  Small pelvic nodes bilaterally, likely reactive. Normal urinary bladder and prostate.  No significant free fluid.  Likely degenerative sclerosis of the bilateral sacroiliac joints.  L4-L5 posterior trans pedicle screw fixation.  IMPRESSION:  1.  Improved and nearly resolved small bowel obstruction.  No acute complication identified. 2.  Underdistended stomach.  Cannot exclude gastric wall thickening.  Correlate with any symptoms to suggest gastric pathology.  Original Report Authenticated By: Consuello Bossier, M.D.   Ct Abdomen Pelvis W Contrast  05/10/2011  *RADIOLOGY REPORT*  Clinical Data: Diffuse abdominal tenderness.  Distension, pain for 3 days. Unable to keep food down.  CT ABDOMEN AND PELVIS WITH CONTRAST  Technique:  Multidetector CT imaging of the abdomen and pelvis was performed following the standard protocol during bolus administration of intravenous contrast.  Contrast: OMNIPAQUE IOHEXOL 300 MG/ML IV SOLN  Comparison: 02/06/2011  Findings: The lung bases are unremarkable.  There is marked dilatation of the proximal small bowel loops. Distal small bowel loops are not dilated.  Colonic loops are normal caliber.  No evidence for mass  within the abdomen.  The contrast does reach the transverse colon, contrast has reached the lower colon, excluding complete obstruction.  However, there is at least partial obstruction, favored to be in the mid small bowel. Definite point of transition is not identified but favored to be in the central abdomen.  No focal abnormality identified within the liver, spleen, or pancreas.  Just above the upper pole region of the right kidney, there is a low attenuation lesion, favored to represent a renal cyst.  Alternatively, this could represent a benign right adrenal lesion.  This measures approximately 2 cm in diameter.  The patient has had prior cholecystectomy.  The appendix has a normal appearance. No evidence for aortic aneurysm. No retroperitoneal or mesenteric adenopathy.  Prostatic calcifications are present.  The patient has had prior lower lumbar posterior fusion. Otherwise, the osseous structures have a normal appearance.  There is SI joint sclerosis, left greater than right.  IMPRESSION:  1.  Partial but significant small bowel obstruction, best localized to the mid small bowel loops. Adhesions are favored as a cause.  No evidence for mass.  No abscess or free intraperitoneal air identified. 2.  Status post cholecystectomy. 3.  Status post posterior lumbar fusion. 4.  No evidence for acute appendicitis. 5.  Right upper pole renal cyst versus benign right adrenal lesion.  Original Report Authenticated By: Patterson Hammersmith, M.D.    Review of Systems  Constitutional: Negative.   HENT: Negative.        Nasogastric tube in place  Eyes: Negative.   Respiratory: Negative.  Cardiovascular: Negative.   Gastrointestinal: Positive for abdominal pain.       See history of present illness   Genitourinary: Negative.   Musculoskeletal: Negative.   Skin: Negative.   Neurological: Negative.   Endo/Heme/Allergies: Negative.    Blood pressure 142/64, pulse 64, temperature 98.6 F (37 C), temperature  source Oral, resp. rate 12, height 6\' 5"  (1.956 m), weight 132.5 kg (292 lb 1.8 oz), SpO2 100.00%. Physical Exam  Constitutional: He is oriented to person, place, and time. He appears well-developed and well-nourished. No distress.  HENT:  Head: Normocephalic and atraumatic.  Eyes: Conjunctivae and EOM are normal. Pupils are equal, round, and reactive to light.  Neck: Normal range of motion. Neck supple.  Cardiovascular: Normal rate, regular rhythm, normal heart sounds and intact distal pulses.   Respiratory: Effort normal and breath sounds normal. He has no wheezes. He has no rales.  GI: Soft. He exhibits no distension. There is tenderness. There is no rebound and no guarding.       Tenderness is mild along the right side with no guarding, bowel sounds are present  Musculoskeletal: Normal range of motion.  Neurological: He is alert and oriented to person, place, and time.  Skin: Skin is warm and dry.    Assessment/Plan: Small bowel obstruction: This is improving with medical therapy. Due to crampy pain I would continue the nasogastric tube today. Likely will be able to remove nasogastric tube in the morning and begin clear liquids. There is no need for acute surgical intervention at this time. We will follow along with you. Plan was discussed in detail with the patient.  Ryan [name] E 05/12/2011, 12:22 PM

## 2011-05-12 NOTE — Progress Notes (Signed)
Subjective: Patient is a 61 yo M with PMH of DM, HTN, DVT and chronic pain who presents with a 3 day history of not being able to tolerate anything PO. Patient found to have a likely partial small bowel obstruction on CT scan. Patient states that he is still having significant pain he shouldn't has had a couple bowel movements. Patient though also has had increasing of nasogastric tube output that has also became darker in color. Gastroccult that I got this morning is positive. Vital signs stable   Objective: Vital signs in last 24 hours: Temp:  [98.4 F (36.9 C)-99.7 F (37.6 C)] 98.4 F (36.9 C) (01/13 0551) Pulse Rate:  [62-70] 69  (01/13 0551) Resp:  [18-24] 24  (01/13 0551) BP: (147-164)/(76-86) 164/86 mmHg (01/13 0551) SpO2:  [92 %-96 %] 92 % (01/13 0551) Weight:  [136.646 kg (301 lb 4 oz)] 136.646 kg (301 lb 4 oz) (01/12 0641) Weight change:  Last BM Date: 05/10/11  Intake/Output from previous day: 01/12 0701 - 01/13 0700 In: -  Out: 6320 [Emesis/NG output:6320] Intake/Output this shift: Total I/O In: -  Out: 3900 [Emesis/NG output:3900]  Physical Exam Constitutional: He appears well-developed. No distress.  Mouth/Throat: Mucous membranes are dry.  Cardiovascular: Normal rate and regular rhythm.  No murmur heard.  Respiratory: No respiratory distress. He has no wheezes.  GI: He exhibits no ascites. Bowel sounds are noted and normal. There is tenderness (Diffusely tender. There is no rigidity. No involuntary guarding  Musculoskeletal: Normal range of motion. He exhibits no edema.  Neurological: He is alert. No cranial nerve deficit or sensory deficit.  Skin: Skin is warm and dry. No rash noted. He is not diaphoretic.   Lab Results:  Basename 05/11/11 0600 05/10/11 0959  WBC 6.5 8.0  HGB 15.2 16.7  HCT 46.5 50.3  PLT 245 263   BMET  Basename 05/11/11 0600 05/10/11 0959  NA 144 143  K 3.0* 2.7*  CL 99 95*  CO2 35* 34*  GLUCOSE 261* 312*  BUN 11 10    CREATININE 0.75 0.76  CALCIUM 9.2 10.5   Ct Abdomen Pelvis W Contrast  05/10/2011  *RADIOLOGY REPORT*  Clinical Data: Diffuse abdominal tenderness.  Distension, pain for 3 days. Unable to keep food down.  CT ABDOMEN AND PELVIS WITH CONTRAST  Technique:  Multidetector CT imaging of the abdomen and pelvis was performed following the standard protocol during bolus administration of intravenous contrast.  Contrast: OMNIPAQUE IOHEXOL 300 MG/ML IV SOLN  Comparison: 02/06/2011  Findings: The lung bases are unremarkable.  There is marked dilatation of the proximal small bowel loops. Distal small bowel loops are not dilated.  Colonic loops are normal caliber.  No evidence for mass within the abdomen.  The contrast does reach the transverse colon, contrast has reached the lower colon, excluding complete obstruction.  However, there is at least partial obstruction, favored to be in the mid small bowel. Definite point of transition is not identified but favored to be in the central abdomen.  No focal abnormality identified within the liver, spleen, or pancreas.  Just above the upper pole region of the right kidney, there is a low attenuation lesion, favored to represent a renal cyst.  Alternatively, this could represent a benign right adrenal lesion.  This measures approximately 2 cm in diameter.  The patient has had prior cholecystectomy.  The appendix has a normal appearance. No evidence for aortic aneurysm. No retroperitoneal or mesenteric adenopathy.  Prostatic calcifications are present.  The  patient has had prior lower lumbar posterior fusion. Otherwise, the osseous structures have a normal appearance.  There is SI joint sclerosis, left greater than right.  IMPRESSION:  1.  Partial but significant small bowel obstruction, best localized to the mid small bowel loops. Adhesions are favored as a cause.  No evidence for mass.  No abscess or free intraperitoneal air identified. 2.  Status post cholecystectomy. 3.   Status post posterior lumbar fusion. 4.  No evidence for acute appendicitis. 5.  Right upper pole renal cyst versus benign right adrenal lesion.  Original Report Authenticated By: Patterson Hammersmith, M.D.   Abdominal Pain   Review of Systems  Gastrointestinal: Positive for abdominal pain.   denies fever, chills, nausea or vomiting.  Physical Exam Assessment & Plan:  61 yo male with PMH of DM, HTN, DVT, and chronic pain presenting with partial small bowel obstruction with increasing nasogastric output and pain.  1. SBO: Patient has had a bowel movement x2 now but decreasing bowel sounds and increasing pain. Patient is in need of more Dilaudid and seems to be in moderate distress. Patient has put out approximately 3500 cc of nasogastric output in the last 8 hours concern for worsening small bowel obstruction.  Gastric occult fluid positive continue nasogastric tube continue patient to be n.p.o. I do feel it is necessary for a repeat CT scan of the abdomen to make sure we do not have a complete small bowel obstruction patient is not present as a perforation. We need to be careful with pain medications due to patient having desaturation of oxygen with too much. Will stop any GI irritating medications recheck CBC for potential drop in hemoglobin. - Amylase and lipase normal no signs of pancreatitis. Will repeat this morning as well as get a lactic acid. - Will consult surgery in the light of patient's not improving. - Will transfer patient to step down unit for closer observation.  2. HTN: Stable. Patient has not taken his blood pressure medications recently but his BP has become minorly elevated the patient is also in pain - Holding home PO medications.  - Continue to monitor. If patient has elevated BP, can add IV hydralazine medication prn for hypertension with systolic blood pressures greater than 180.   3. DM: On oral hypoglycemics at home. Patient's A1c was 12.7%. Patient likely will need to  have increasing medications at home or consider starting Lantus therapy. We'll discuss with patient at a later date. Patient's blood sugar seems to be dropping and has been n.p.o. now for greater than 48 hours. It continues to drop we will consider adding D5 fluids.  4. Hypokalemia: No EKG changes patient's potassium today is 3.0 - Patient unable to tolerate runs of potassium chloride. Unable to take by mouth so we'll continue to add potassium to his IV fluids. Continue to monitor daily  5. Chronic pain: On Oxycodone 30mg  q6 hours at home for chronic back status post surgery and leg pain  - On Dilaudid, increasing dose to 1-2 mg every 3 hours - Continue to monitor must be careful due to patient having decreased respiration rate 6. FEN/GI- NPO, NGT, NS + 40 kcl @ 125cc/hr we'll need to monitor to keep up with nasogastric output. 7. PPx- Heparin SQ  8. Dispo: Pending clinical improvement and tolerating PO and surgical consult.

## 2011-05-13 LAB — GLUCOSE, CAPILLARY: Glucose-Capillary: 216 mg/dL — ABNORMAL HIGH (ref 70–99)

## 2011-05-13 LAB — COMPREHENSIVE METABOLIC PANEL
ALT: 10 U/L (ref 0–53)
AST: 14 U/L (ref 0–37)
Alkaline Phosphatase: 81 U/L (ref 39–117)
CO2: 30 mEq/L (ref 19–32)
Chloride: 103 mEq/L (ref 96–112)
GFR calc Af Amer: >90 mL/min (ref >90)
GFR calc non Af Amer: >90 mL/min (ref >90)
Glucose, Bld: 169 mg/dL — ABNORMAL HIGH (ref 70–99)
Potassium: 3.7 mEq/L (ref 3.5–5.1)
Sodium: 143 mEq/L (ref 135–145)

## 2011-05-13 LAB — CBC
Hemoglobin: 15.1 g/dL (ref 13.0–17.0)
MCH: 30.7 pg (ref 26.0–34.0)
MCHC: 31.4 g/dL (ref 30.0–36.0)
RDW: 13.5 % (ref 11.5–15.5)

## 2011-05-13 MED ORDER — OXYCODONE HCL 5 MG PO TABS
30.0000 mg | ORAL_TABLET | ORAL | Status: DC | PRN
  Administered 2011-05-13 – 2011-05-14 (×5): 30 mg via ORAL
  Filled 2011-05-13: qty 6
  Filled 2011-05-13: qty 3
  Filled 2011-05-13: qty 7
  Filled 2011-05-13: qty 3
  Filled 2011-05-13 (×2): qty 6

## 2011-05-13 MED ORDER — INSULIN GLARGINE 100 UNIT/ML ~~LOC~~ SOLN
10.0000 [IU] | Freq: Every day | SUBCUTANEOUS | Status: DC
  Administered 2011-05-13: 10 [IU] via SUBCUTANEOUS

## 2011-05-13 MED ORDER — DOCUSATE SODIUM 100 MG PO CAPS
100.0000 mg | ORAL_CAPSULE | Freq: Two times a day (BID) | ORAL | Status: DC
  Administered 2011-05-14: 100 mg via ORAL
  Filled 2011-05-13 (×2): qty 1

## 2011-05-13 MED ORDER — POLYETHYLENE GLYCOL 3350 17 G PO PACK
17.0000 g | PACK | Freq: Every day | ORAL | Status: DC
  Administered 2011-05-14: 17 g via ORAL
  Filled 2011-05-13 (×2): qty 1

## 2011-05-13 MED ORDER — PANTOPRAZOLE SODIUM 40 MG IV SOLR
40.0000 mg | Freq: Two times a day (BID) | INTRAVENOUS | Status: DC
  Administered 2011-05-13: 40 mg via INTRAVENOUS
  Filled 2011-05-13 (×3): qty 40

## 2011-05-13 NOTE — Progress Notes (Signed)
PGY-1 Daily Progress Note Family Medicine Teaching Service Ryan [name]. Ryan Sleigh, MD Service Pager: [phone]   Subjective: only 100 out in last 12 hours. Feeling more comfortable. Ok with pulling NG tube. Still says having some periumbilical abdominal pain.   Objective:  Temp:  [98.1 F (36.7 C)-98.6 F (37 C)] 98.6 F (37 C) (01/14 0400) Pulse Rate:  [58-70] 64  (01/14 0400) Resp:  [10-20] 12  (01/14 0400) BP: (125-143)/(52-81) 138/68 mmHg (01/14 0400) SpO2:  [91 %-97 %] 92 % (01/14 0400) no o2 this morning.  FiO2 (%):  [2 %] 2 % (01/13 1000) Weight:  [286 lb 2.5 oz (129.8 kg)] 286 lb 2.5 oz (129.8 kg) (01/14 0400)  Intake/Output Summary (Last 24 hours) at 05/13/11 0836 Last data filed at 05/13/11 0800  Gross per 24 hour  Intake 2989.42 ml  Output   2375 ml  Net 614.42 ml    Constitutional: He appears well-developed. No distress.  Mouth/Throat: Mucous membranes moist.  Cardiovascular: Normal rate and regular rhythm.  No murmur heard.  Respiratory: No respiratory distress. He has no wheezes.  GI: He exhibits no ascites. Bowel sounds are noted and normal.  No abdominal tenderness noted on exam until asked patient about pain then some mild grimace in peri umbilicus.  There is no rigidity. No involuntary guarding  Musculoskeletal: Normal range of motion. He exhibits no edema.  Neurological: He is alert. No cranial nerve deficit or sensory deficit.  Skin: Skin is warm and dry. No rash noted. He is not diaphoretic.   Labs and imaging:   CBC  Lab 05/13/11 0546 05/12/11 1557 05/12/11 0811  WBC 7.6 8.0 7.6  HGB 15.1 14.9 15.4  HCT 48.1 46.9 47.9  PLT 215 232 234   BMET  Lab 05/13/11 0546 05/12/11 0812 05/11/11 0600  NA 143 150* 144  K 3.7 3.3* 3.0*  CL 103 102 99  CO2 30 36* 35*  BUN 12 16 11   CREATININE 0.74 0.89 0.75  LABGLOM -- -- --  GLUCOSE 169* -- --  CALCIUM 9.3 9.1 9.2    Ct Abdomen Pelvis W Contrast  05/12/2011  *RADIOLOGY REPORT*  Clinical Data:  Worsening symptoms of small bowel obstruction. Diabetes.  Cholecystectomy.  Hypertension.  CT ABDOMEN AND PELVIS WITH CONTRAST  Technique:  Multidetector CT imaging of the abdomen and pelvis was performed following the standard protocol during bolus administration of intravenous contrast.  Contrast: OMNIPAQUE IOHEXOL 300 MG/ML IV SOLN 05/10/2011  Comparison: 05/10/2011  Findings: Clear lung bases.  Normal heart size without pericardial or pleural effusion.  Normal liver.  A small splenule.  The proximal stomach is underdistended, with a nasogastric tube terminating in the gastric antrum.  The greater curvatures of the stomach is borderline thickened, possibly due to underdistension.  Normal pancreas. Cholecystectomy without biliary ductal dilatation. Right  upper pole right renal cyst again identified.  A left renal lesion measures 19 - 20 HU and is likely a minimally complex cyst. Approximately 1 cm.  Too small to characterize left renal lesion as well.  Small retroperitoneal nodes are not pathologic by size criteria.  The colon is normal in caliber.  Normal terminal ileum and appendix.  The small bowel obstruction pattern is improved.  Air fluid levels within the small bowel loops measuring up to 2.9 cm on image 42.  Normal mid-disease distal small bowel caliber, with contrast identified to the colon.  No mesenteric abnormality identified.  No small bowel wall thickening, pneumatosis, or free intraperitoneal air.  No ascites.  Small pelvic nodes bilaterally, likely reactive. Normal urinary bladder and prostate.  No significant free fluid.  Likely degenerative sclerosis of the bilateral sacroiliac joints.  L4-L5 posterior trans pedicle screw fixation.  IMPRESSION:  1.  Improved and nearly resolved small bowel obstruction.  No acute complication identified. 2.  Underdistended stomach.  Cannot exclude gastric wall thickening.  Correlate with any symptoms to suggest gastric pathology.  Original Report  Authenticated By: Consuello Bossier, M.D.     Assessment  61 yo male with PMH of DM, HTN, DVT, and chronic pain presenting with partial small bowel obstruction with increasing nasogastric output and pain.   1. SBO: on 1/13 there was concern for worsening SBO as patient with decreased bowel sounds, increasing pain, 3500 out in 8 hours. CT scan showed improved and nearly resolved small bowel obstruction. NG tube was left in. Surgery came by to see patient and recommended d/c NG in morning and clear liquids. No surgical intervention. lipase and lactic acid were not elevated.  *Only 100 out in last 12 hours so will discontinue NG tube.  *NG fluid was hemoccult positive yesterday. Hgb stable today. Suspect Mucosal injury. Will monitor.  *consider transfer out of SDU  2. HTN: Stable. Patient has not taken his blood pressure medications recently but his BP has become minorly elevated the patient is also in pain  - Holding home PO medications for now. WIll restart later today if needed vs. Tomorrow.  - Continue to monitor. If patient has elevated BP, can add IV hydralazine medication prn for hypertension with systolic blood pressures greater than 180.   3. DM: On oral hypoglycemics at home. Patient's A1c was 12.7%. LIkely need Lantus at home vs. Maximized PO.  *CBGs typically <180, but yesterday AM up to 242 and 193 at noon. Only on Lantus 5 units. Will increase to 10 *SSI moderate.   4. Chronic pain: On Oxycodone 30mg  q6 hours at home for chronic back status post surgery and leg pain  - On Dilaudid,  dose to 1-2 mg every 3 hours  - Continue to monitor must be careful due to patient having decreased respiration rate  *consider changing back to home medicines today.   5. FEN/GI- NS + 40 kcl @ 125cc/hr . Hypokalemia resolved. Will pull NG and advance to liquid clear diet.  6. PPx- Heparin SQ  7. Dispo: Pending clinical improvement    Tana Conch, MD PGY1, Family Medicine Teaching  Service [phone]

## 2011-05-13 NOTE — Progress Notes (Signed)
Adv diet slowly.  I saw the patient, participated in the history, exam and medical decision making, and concur with the physician assistant's note above.  Mary Sella. Andrey Campanile, MD, FACS General, Bariatric, & Minimally Invasive Surgery [facility], [place]

## 2011-05-13 NOTE — Progress Notes (Signed)
I discussed with Dr Hunter.  I agree with their plans documented in their  Note for today.  

## 2011-05-13 NOTE — Progress Notes (Signed)
Inpatient Diabetes Program Recommendations  AACE/ADA: New Consensus Statement on Inpatient Glycemic Control (2009)  Target Ranges:  Prepandial:   less than 140 mg/dL      Peak postprandial:   less than 180 mg/dL (1-2 hours)      Critically ill patients:  140 - 180 mg/dL   Reason for Visit: Results for FERLIN, FAIRHURST (MRN [id number]) as of 05/13/2011 14:31  Ref. Range 05/13/2011 07:42 05/13/2011 11:40  Glucose-Capillary Latest Range: 70-99 mg/dL 440 (H) 347 (H)    Inpatient Diabetes Program Recommendations Insulin - Basal: Increase Lantus to 15 units daily. HgbA1C: Note A1C=12.7%.  May need insulin after discharge.  Will follow.  Note:

## 2011-05-13 NOTE — Progress Notes (Signed)
Subjective: Pt already had NG tube removed earlier and is tolerating clear liquids well with no nausea, no vomiting and has continued to pass flatus. He reports having a BM last night as well. He denies abdominal pain currently.  Objective: Vital signs in last 24 hours: Temp:  [98.1 F (36.7 C)-98.6 F (37 C)] 98.6 F (37 C) (01/14 0744) Pulse Rate:  [58-70] 64  (01/14 0400) Resp:  [10-20] 16  (01/14 0744) BP: (125-143)/(52-81) 138/68 mmHg (01/14 0400) SpO2:  [91 %-97 %] 92 % (01/14 0400) Weight:  [129.8 kg (286 lb 2.5 oz)] 129.8 kg (286 lb 2.5 oz) (01/14 0400) Last BM Date: 05/12/11  Intake/Output from previous day: 01/13 0701 - 01/14 0700 In: 2844.4 [I.V.:2844.4] Out: 2375 [Urine:1375; Emesis/NG output:1000] Intake/Output this shift: Total I/O In: 290 [I.V.:290] Out: 0   General appearance: alert, cooperative, appears stated age and morbidly obese Resp: clear to auscultation bilaterally Cardio: regular rate and rhythm GI: soft, non-tender; bowel sounds normal; no masses,  no organomegaly  Lab Results:   [facility] 05/13/11 0546 05/12/11 1557  WBC 7.6 8.0  HGB 15.1 14.9  HCT 48.1 46.9  PLT 215 232   BMET  Basename 05/13/11 0546 05/12/11 0812  NA 143 150*  K 3.7 3.3*  CL 103 102  CO2 30 36*  GLUCOSE 169* 242*  BUN 12 16  CREATININE 0.74 0.89  CALCIUM 9.3 9.1   PT/INR No results found for this basename: LABPROT:2,INR:2 in the last 72 hours ABG No results found for this basename: PHART:2,PCO2:2,PO2:2,HCO3:2 in the last 72 hours  Studies/Results: Ct Abdomen Pelvis W Contrast  05/12/2011  *RADIOLOGY REPORT*  Clinical Data: Worsening symptoms of small bowel obstruction. Diabetes.  Cholecystectomy.  Hypertension.  CT ABDOMEN AND PELVIS WITH CONTRAST  Technique:  Multidetector CT imaging of the abdomen and pelvis was performed following the standard protocol during bolus administration of intravenous contrast.  Contrast: OMNIPAQUE IOHEXOL 300 MG/ML IV SOLN  05/10/2011  Comparison: 05/10/2011  Findings: Clear lung bases.  Normal heart size without pericardial or pleural effusion.  Normal liver.  A small splenule.  The proximal stomach is underdistended, with a nasogastric tube terminating in the gastric antrum.  The greater curvatures of the stomach is borderline thickened, possibly due to underdistension.  Normal pancreas. Cholecystectomy without biliary ductal dilatation. Right  upper pole right renal cyst again identified.  A left renal lesion measures 19 - 20 HU and is likely a minimally complex cyst. Approximately 1 cm.  Too small to characterize left renal lesion as well.  Small retroperitoneal nodes are not pathologic by size criteria.  The colon is normal in caliber.  Normal terminal ileum and appendix.  The small bowel obstruction pattern is improved.  Air fluid levels within the small bowel loops measuring up to 2.9 cm on image 42.  Normal mid-disease distal small bowel caliber, with contrast identified to the colon.  No mesenteric abnormality identified.  No small bowel wall thickening, pneumatosis, or free intraperitoneal air. No ascites.  Small pelvic nodes bilaterally, likely reactive. Normal urinary bladder and prostate.  No significant free fluid.  Likely degenerative sclerosis of the bilateral sacroiliac joints.  L4-L5 posterior trans pedicle screw fixation.  IMPRESSION:  1.  Improved and nearly resolved small bowel obstruction.  No acute complication identified. 2.  Underdistended stomach.  Cannot exclude gastric wall thickening.  Correlate with any symptoms to suggest gastric pathology.  Original Report Authenticated By: Consuello Bossier, M.D.    Anti-infectives: Anti-infectives  None      Assessment/Plan: PSBO- seems to be clearing well and tolerated clear liquids well.  Would advance diet as tolerated over next 24 hours and monitor  LOS: 3 days   RAYBURN,SHAWN,PA-C Pager [phone] General Trauma Pager [phone]

## 2011-05-14 ENCOUNTER — Inpatient Hospital Stay (HOSPITAL_COMMUNITY): Payer: Medicare Other

## 2011-05-14 DIAGNOSIS — E876 Hypokalemia: Secondary | ICD-10-CM

## 2011-05-14 DIAGNOSIS — K56609 Unspecified intestinal obstruction, unspecified as to partial versus complete obstruction: Secondary | ICD-10-CM

## 2011-05-14 LAB — COMPREHENSIVE METABOLIC PANEL
CO2: 28 mEq/L (ref 19–32)
Calcium: 9 mg/dL (ref 8.4–10.5)
Creatinine, Ser: 0.78 mg/dL (ref 0.50–1.35)
GFR calc Af Amer: >90 mL/min (ref >90)
GFR calc non Af Amer: >90 mL/min (ref >90)
Glucose, Bld: 268 mg/dL — ABNORMAL HIGH (ref 70–99)
Sodium: 137 mEq/L (ref 135–145)
Total Protein: 5.9 g/dL — ABNORMAL LOW (ref 6.0–8.3)

## 2011-05-14 LAB — CBC
HCT: 42.1 % (ref 39.0–52.0)
Hemoglobin: 13.6 g/dL (ref 13.0–17.0)
Platelets: 197 10*3/uL (ref 150–400)
RBC: 4.41 MIL/uL (ref 4.22–5.81)
RDW: 13.1 % (ref 11.5–15.5)
WBC: 6.3 10*3/uL (ref 4.0–10.5)

## 2011-05-14 MED ORDER — PANTOPRAZOLE SODIUM 40 MG PO TBEC
40.0000 mg | DELAYED_RELEASE_TABLET | Freq: Every day | ORAL | Status: DC
  Administered 2011-05-14: 40 mg via ORAL
  Filled 2011-05-14: qty 1

## 2011-05-14 MED ORDER — OXYCODONE HCL 30 MG PO TABS: 30.0000 mg | ORAL_TABLET | ORAL | Status: DC | PRN

## 2011-05-14 MED ORDER — OMEPRAZOLE 40 MG PO CPDR: 40.0000 mg | DELAYED_RELEASE_CAPSULE | Freq: Every day | ORAL | Status: DC

## 2011-05-14 MED ORDER — POLYETHYLENE GLYCOL 3350 17 G PO PACK: 17.0000 g | PACK | Freq: Every day | ORAL | Status: AC | PRN

## 2011-05-14 MED ORDER — ONDANSETRON HCL 4 MG PO TABS: 4.0000 mg | ORAL_TABLET | Freq: Three times a day (TID) | ORAL | Status: AC | PRN

## 2011-05-14 MED ORDER — DSS 100 MG PO CAPS: 100.0000 mg | ORAL_CAPSULE | Freq: Two times a day (BID) | ORAL | Status: AC | PRN

## 2011-05-14 NOTE — Progress Notes (Signed)
Patient ID: Ryan [name], male   DOB: 03-18-1951, 61 y.o.   MRN: [id number] PGY-1 Daily Progress Note Family Medicine Teaching Service Rodman Pickle, MD Service Pager: [phone]   Subjective: Patient tolerating soft mechanical diet. Has had BM since admission. Continues to have mid-abdominal pain but doing well on home medications  Objective:  Temp:  [98.1 F (36.7 C)-98.6 F (37 C)] 98.6 F (37 C) (01/14 0400) Pulse Rate:  [58-70] 64  (01/14 0400) Resp:  [10-20] 12  (01/14 0400) BP: (125-143)/(52-81) 138/68 mmHg (01/14 0400) SpO2:  [91 %-97 %] 92 % (01/14 0400) no o2 this morning.  FiO2 (%):  [2 %] 2 % (01/13 1000) Weight:  [286 lb 2.5 oz (129.8 kg)] 286 lb 2.5 oz (129.8 kg) (01/14 0400)  Intake/Output Summary (Last 24 hours) at 05/14/11 0713 Last data filed at 05/14/11 0443  Gross per 24 hour  Intake    290 ml  Output   1650 ml  Net  -1360 ml    Constitutional: He appears well-developed. No distress. Sitting on edge of bed. Cardiovascular: Normal rate and regular rhythm. No murmur heard.  Respiratory: No respiratory distress. He has no wheezes.  GI: He exhibits no ascites. Bowel sounds are noted and normal.  Mild tenderness in umbilical area. There is no rigidity. No involuntary guarding  Musculoskeletal: Normal range of motion. He exhibits no edema.  Neurological: He is alert. No cranial nerve deficit or sensory deficit.  Skin: Skin is warm and dry. No rash noted. He is not diaphoretic.   Labs and imaging:   CBC  Lab 05/14/11 0520 05/13/11 0546 05/12/11 1557  WBC 6.3 7.6 8.0  HGB 13.6 15.1 14.9  HCT 42.1 48.1 46.9  PLT 197 215 232   BMET  Lab 05/14/11 0520 05/13/11 0546 05/12/11 0812  NA 137 143 150*  K 4.0 3.7 3.3*  CL 104 103 102  CO2 28 30 36*  BUN 9 12 16   CREATININE 0.78 0.74 0.89  LABGLOM -- -- --  GLUCOSE 268* -- --  CALCIUM 9.0 9.3 9.1    Ct Abdomen Pelvis W Contrast  05/12/2011  *RADIOLOGY REPORT*  Clinical Data: Worsening symptoms of small  bowel obstruction. Diabetes.  Cholecystectomy.  Hypertension.  CT ABDOMEN AND PELVIS WITH CONTRAST  Technique:  Multidetector CT imaging of the abdomen and pelvis was performed following the standard protocol during bolus administration of intravenous contrast.  Contrast: OMNIPAQUE IOHEXOL 300 MG/ML IV SOLN 05/10/2011  Comparison: 05/10/2011  Findings: Clear lung bases.  Normal heart size without pericardial or pleural effusion.  Normal liver.  A small splenule.  The proximal stomach is underdistended, with a nasogastric tube terminating in the gastric antrum.  The greater curvatures of the stomach is borderline thickened, possibly due to underdistension.  Normal pancreas. Cholecystectomy without biliary ductal dilatation. Right  upper pole right renal cyst again identified.  A left renal lesion measures 19 - 20 HU and is likely a minimally complex cyst. Approximately 1 cm.  Too small to characterize left renal lesion as well.  Small retroperitoneal nodes are not pathologic by size criteria.  The colon is normal in caliber.  Normal terminal ileum and appendix.  The small bowel obstruction pattern is improved.  Air fluid levels within the small bowel loops measuring up to 2.9 cm on image 42.  Normal mid-disease distal small bowel caliber, with contrast identified to the colon.  No mesenteric abnormality identified.  No small bowel wall thickening, pneumatosis, or free intraperitoneal air.  No ascites.  Small pelvic nodes bilaterally, likely reactive. Normal urinary bladder and prostate.  No significant free fluid.  Likely degenerative sclerosis of the bilateral sacroiliac joints.  L4-L5 posterior trans pedicle screw fixation.  IMPRESSION:  1.  Improved and nearly resolved small bowel obstruction.  No acute complication identified. 2.  Underdistended stomach.  Cannot exclude gastric wall thickening.  Correlate with any symptoms to suggest gastric pathology.  Original Report Authenticated By: Consuello Bossier, M.D.      Assessment  61 yo male with PMH of DM, HTN, DVT, and chronic pain presenting with partial small bowel obstruction, now resolving.   1. SBO: Partial SBO noted on admission as well as nausea and abdominal pain. Hospital course: 1/13 there was concern for worsening SBO as patient with decreased bowel sounds, increasing pain, 3500 out in 8 hours. CT scan showed improved and nearly resolved small bowel obstruction. NG tube was left in. Surgery came by to see patient and recommended d/c NGT and clear liquids but no surgical intervention. Lipase, WBC and lactic acid were not elevated. NG output was hemoccult positive on hospital day #2. Hgb has remained stable. Most likely mucosal injury but patient was transferred to SDU and Surgery was consulted. Patient stable, and transferred back to the floor early this morning. - NGT out. Patient tolerating diet. No N/V - Patient had a BM - Labs within normal limits - Pain controlled on home medications. No IV pain medications given in last 24 hours.  2. HTN: Stable. Patient has not taken his blood pressure medications recently due to nausea. BP was minorly elevated, likely secondary to pain. - Will restart home medications today in preparation for discharge - Continue to monitor.  3. DM: On oral hypoglycemics at home. Patient's A1c was 12.7%. LIkely need Lantus at home vs. Maximized PO. (Will defer to PCP to make appropriate adjustments.)  - CBG's elevated. Lantus increased to 10U yesterday. DM coordinator recommends increasing to 15U.  - Continue SSI moderate.   4. Chronic pain: On Oxycodone 30mg  q6 hours at home for chronic back status post surgery and leg pain  - Dilaudid D/C yesterday. Patient doing well on home medications. - Continue to monitor on telemetry.  5. FEN/GI- IVF to [facility]. (Hypokalemia resolved.) Soft mechanical diet today, advance as tolerated 6. PPx- Heparin SQ  7. Dispo: Pending clinical improvement. Anticipated discharge in next 24-48  hours    Rodman Pickle, MD PGY1, Family Medicine Teaching Service [phone]

## 2011-05-14 NOTE — Discharge Summary (Signed)
Physician Discharge Summary  Patient ID: Ryan [name] MRN: [id number] DOB/AGE: 1950/07/08 61 y.o.  Admit date: 05/10/2011 Discharge date: 05/14/2011  Admission Diagnoses: Abdominal pain, nausea and vomiting  Discharge Diagnoses:  Principal Problem:  *Small bowel obstruction Active Problems:  DIABETES MELLITUS, TYPE II  HYPERTENSION  Hypokalemia  Hyperglycemia   Discharged Condition: stable  Hospital Course: Patient is a 61 yo M admitted for partial small bowel obstruction. In brief, his hospital problems are as follows: 1. SBO- SBO partial seen on abd CT at admission. Patient was having increased pain as well as N/V therefore he was admitted. He was put on bowel rest (NPO and NGT) and continued to have pain, despite frequent dosing of Dilaudid. Patient was having a bowel movement x2 but had decreasing bowel sounds and increasing pain, as well as gastric occult fluid positive. (Amylase and lipase normal no signs of pancreatitis.) Patient transferred to stepdown unit on hospital day #2 for closer observation. General surgery was consulted. Patient had repeat abd CT which did not show complete obstruction. NPO and NGT continued and patient slowly improved. By hospital day #4, patient was tolerating clears and NGT was pulled. He was transferred out of stepdown, diet was advanced. He was transitioned back to PO pain medications. On day of discharge, patient had KUB which did not show obstruction. Patient was sent home with PPI, bowel regimen and home dose of Oxycodone.  2. DM- Patient started on SSI while NPO. Once his diet was advanced, he was hyperglycemic and therefore started on Lantus. He was on Lantus 10U at discharge and CBG's were still elevated. We did not start him on insulin at discharge, but would recommend for PCP to consider addressing this as an outpatient given his his A1C and insulin requirement in the hospital. Metformin was held in hospital due to CT scan with contrast. This  was restarted at discharge since he was >48 hours since CT scan. Patient given information of warning signs with restarting Metformin.   3. HTN- Stable off medication in the hospital. No antihypertensives noted on admission, therefore none were resumed at discharge. Would recommend PCP follow-up his blood pressure and resume/start medications as appropriate.  4. Pain- Chronic back pain, on Oxycocone 30mg  q6 hours at home. Patient received Dilaudid for abdominal pain, but was transitioned back to home medications prior to discharge. Per [facility], patient was allowed one week refill of Oxycodone 30mg . He will follow-up with [facility] in [place], [place] for additional refills and further evaluation of pain.  Consults: general surgery  Significant Diagnostic Studies:  CBC    Component Value Date/Time   WBC 6.3 05/14/2011 0520   RBC 4.41 05/14/2011 0520   HGB 13.6 05/14/2011 0520   HCT 42.1 05/14/2011 0520   PLT 197 05/14/2011 0520   MCV 95.5 05/14/2011 0520   MCH 30.8 05/14/2011 0520   MCHC 32.3 05/14/2011 0520   RDW 13.1 05/14/2011 0520   LYMPHSABS 2.7 02/06/2011 0754   MONOABS 0.8 02/06/2011 0754   EOSABS 0.1 02/06/2011 0754   BASOSABS 0.1 02/06/2011 0754   BMET    Component Value Date/Time   NA 137 05/14/2011 0520   K 4.0 05/14/2011 0520   CL 104 05/14/2011 0520   CO2 28 05/14/2011 0520   GLUCOSE 268* 05/14/2011 0520   BUN 9 05/14/2011 0520   CREATININE 0.78 05/14/2011 0520   CALCIUM 9.0 05/14/2011 0520   GFRNONAA >90 05/14/2011 0520   GFRAA >90 05/14/2011 0520   Lipase-  28 Amylase- 33 Lactic Acid- 1.7  Ct Abdomen Pelvis W Contrast 05/10/2011    IMPRESSION:  1.  Partial but significant small bowel obstruction, best localized to the mid small bowel loops. Adhesions are favored as a cause.  No evidence for mass.  No abscess or free intraperitoneal air identified. 2.  Status post cholecystectomy. 3.  Status post posterior lumbar fusion. 4.  No evidence for acute  appendicitis. 5.  Right upper pole renal cyst versus benign right adrenal lesion.    Ct Abdomen Pelvis W Contrast 05/12/2011   IMPRESSION:  1.  Improved and nearly resolved small bowel obstruction.  No acute complication identified. 2.  Underdistended stomach.  Cannot exclude gastric wall thickening.  Correlate with any symptoms to suggest gastric pathology.  Original Report   Dg Abd 2 Views 05/14/2011  *Findings: There is scattered contrast in the left colon.  No distended small bowel loops.  No free air.  The soft tissue shadows are maintained.  The lung bases are grossly clear.  IMPRESSION: No plain film findings for acute abdominal process.  No small bowel obstruction or free air.   Treatments: IV hydration and analgesia: Dilaudid 1mg  q3 and transitioned back to home medication of Oxy 30mg  q6  Discharge Exam: Blood pressure 128/66, pulse 74, temperature 97.9 F (36.6 C), temperature source Oral, resp. rate 18, height 6\' 5"  (1.956 m), weight 286 lb 2.5 oz (129.8 kg), SpO2 100.00%.  Recommendations for follow-up: - Please follow up for improvement of pain. KUB on day of discharge did not got SBO. Patient is tolerating full diet. Pain should improve soon, but patient is on chronic narcotics. Sent home with bowel regimen as well as one week refill of Oxycodone per pain management. - Please consider addressing diabetes management. A1C of 12.7. Only on oral medications. Did require Lantus and sliding scale in the hospital/ - Please follow up hypertension. Stable throughout admission and not sent home on antihypertensives since there were none in his med rec at admission.  Disposition: Home or Self Care   Medication List  As of 05/14/2011 10:00 PM   TAKE these medications         DSS 100 MG Caps   Take 100 mg by mouth 2 (two) times daily as needed for constipation.      gabapentin 400 MG capsule   Commonly known as: NEURONTIN   Take 400 mg by mouth 3 (three) times daily.      glipiZIDE 10  MG tablet   Commonly known as: GLUCOTROL   Take 10 mg by mouth 2 (two) times daily before a meal.      metFORMIN 1000 MG tablet   Commonly known as: GLUCOPHAGE   Take 1,000 mg by mouth 2 (two) times daily with a meal.      omeprazole 40 MG capsule   Commonly known as: PRILOSEC   Take 1 capsule (40 mg total) by mouth daily.      ondansetron 4 MG tablet   Commonly known as: ZOFRAN   Take 1 tablet (4 mg total) by mouth every 8 (eight) hours as needed for nausea.      oxycodone 30 MG immediate release tablet   Commonly known as: ROXICODONE   Take 1 tablet (30 mg total) by mouth every 4 (four) hours as needed. For pain      polyethylene glycol packet   Commonly known as: MIRALAX / GLYCOLAX   Take 17 g by mouth daily as needed.  ranitidine 300 MG capsule   Commonly known as: ZANTAC   Take 300 mg by mouth 2 (two) times daily.           Follow-up Information    Follow up with DAUB,STEVE A, MD. Schedule an appointment as soon as possible for a visit in 1 week.   Contact information:   [address] [place] [address] [phone]       Follow up with Dr. Selena Lesser on 05/21/2011. (at 1pm)          Signed: Shirlette Scarber 05/14/2011, 10:00 PM

## 2011-05-14 NOTE — Progress Notes (Signed)
Addendum: x-rays normal. Agree he is stable for discharge. 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 No immediate surgical issues, signing off. Marianna Fuss PA_C 12:37pm  Subjective: Complaining of crampy abdominal pain. No further BMs yesterday, but passing flatus. No N/V with NG out and he says he's had some broth. He's requesting pain medications.  Objective: Vital signs in last 24 hours: Temp:  [98.1 F (36.7 C)-99.2 F (37.3 C)] 98.3 F (36.8 C) (01/15 0538) Pulse Rate:  [60-69] 60  (01/15 0538) Resp:  [18-21] 21  (01/15 0538) BP: (108-148)/(58-88) 108/58 mmHg (01/15 0538) SpO2:  [94 %-98 %] 94 % (01/15 0538) Last BM Date: 05/13/11  Intake/Output this shift:    Physical Exam: BP 108/58  Pulse 60  Temp(Src) 98.3 F (36.8 C) (Oral)  Resp 21  Ht 6\' 5"  (1.956 m)  Wt 129.8 kg (286 lb 2.5 oz)  BMI 33.93 kg/m2  SpO2 94% Abdomen:, very soft, ND, NT. Positive BS  Labs: CBC  Basename 05/14/11 0520 05/13/11 0546  WBC 6.3 7.6  HGB 13.6 15.1  HCT 42.1 48.1  PLT 197 215   BMET  Basename 05/14/11 0520 05/13/11 0546  NA 137 143  K 4.0 3.7  CL 104 103  CO2 28 30  GLUCOSE 268* 169*  BUN 9 12  CREATININE 0.78 0.74  CALCIUM 9.0 9.3   LFT  Basename 05/14/11 0520 05/12/11 0811  PROT 5.9* --  ALBUMIN 2.7* --  AST 11 --  ALT 9 --  ALKPHOS 65 --  BILITOT 0.5 --  BILIDIR -- --  IBILI -- --  LIPASE -- 29   PT/INR No results found for this basename: LABPROT:2,INR:2 in the last 72 hours ABG No results found for this basename: PHART:2,PCO2:2,PO2:2,HCO3:2 in the last 72 hours  Studies/Results: Ct Abdomen Pelvis W Contrast  05/12/2011  *RADIOLOGY REPORT*  Clinical Data: Worsening symptoms of small bowel obstruction. Diabetes.  Cholecystectomy.  Hypertension.  CT ABDOMEN AND PELVIS WITH CONTRAST  Technique:  Multidetector CT imaging of the abdomen and pelvis was performed following the standard protocol during bolus  administration of intravenous contrast.  Contrast: OMNIPAQUE IOHEXOL 300 MG/ML IV SOLN 05/10/2011  Comparison: 05/10/2011  Findings: Clear lung bases.  Normal heart size without pericardial or pleural effusion.  Normal liver.  A small splenule.  The proximal stomach is underdistended, with a nasogastric tube terminating in the gastric antrum.  The greater curvatures of the stomach is borderline thickened, possibly due to underdistension.  Normal pancreas. Cholecystectomy without biliary ductal dilatation. Right  upper pole right renal cyst again identified.  A left renal lesion measures 19 - 20 HU and is likely a minimally complex cyst. Approximately 1 cm.  Too small to characterize left renal lesion as well.  Small retroperitoneal nodes are not pathologic by size criteria.  The colon is normal in caliber.  Normal terminal ileum and appendix.  The small bowel obstruction pattern is improved.  Air fluid levels within the small bowel loops measuring up to 2.9 cm on image 42.  Normal mid-disease distal small bowel caliber, with contrast identified to the colon.  No mesenteric abnormality identified.  No small bowel wall thickening, pneumatosis, or free intraperitoneal air. No ascites.  Small pelvic nodes bilaterally, likely reactive. Normal urinary bladder and prostate.  No significant free fluid.  Likely degenerative sclerosis of the bilateral sacroiliac joints.  L4-L5 posterior trans pedicle screw fixation.  IMPRESSION:  1.  Improved and nearly resolved  small bowel obstruction.  No acute complication identified. 2.  Underdistended stomach.  Cannot exclude gastric wall thickening.  Correlate with any symptoms to suggest gastric pathology.  Original Report Authenticated By: Consuello Bossier, M.D.    Assessment: PSBO-resolving but increased pain today     Plan: Will check x-ray to re-eval bowel gas pattern. Advised pt that pain meds are not good for bowel motility......and review of chart shows previous  need for high doses of pain narcotics. Will defer pain control management to primary team.  LOS: 4 days    Marianna Fuss 05/14/2011

## 2011-05-14 NOTE — Progress Notes (Signed)
Pt received from 3300 with nurse originally admitted with SBO. NG tube no longer in place.  Placed on telemetry. No skin issues assessed.

## 2011-05-14 NOTE — Progress Notes (Signed)
Pt tx to 6712 via wheelchair with RN on monitor. Receiving RN aware and at bedside.

## 2011-05-14 NOTE — Progress Notes (Signed)
PGY-1 Progress Note  Patient medically stable for discharge at this time. He is requesting a refill on his Oxycodone 30mg , which is a chronic medication prescribed by Dr. Dellia Cloud at [facility] in [place], [place] [phone].) Patient states he had an appointment there today which he missed since he is in the hospital. He states he is out of his medication at home, despite being in the hospital for the last 4 days. We do not want patient to break his pain contract if we prescribe him pain medication to get him to his next appointment. Therefore, I called [facility] directly twice and was put to voicemail both times. I left a detailed message about Mr. Ealy's situation and asked for them to return the call to the [facility] telephone since Mr. Bungert is being discharged home today. Will await a return phone call before prescribing Oxycodone. Since he is medically stable, there is no indication for patient to remain in the hospital. He will be discharged home today.  Arless Vineyard M. Adamarys Shall, M.D.

## 2011-05-14 NOTE — Progress Notes (Signed)
I discussed with Dr Mikel Cella.  I agree with their plans documented in their note for today.

## 2011-05-14 NOTE — Progress Notes (Signed)
Called [facility] and discussed situation with nurse.  Patient did have appt this AM and pain medications would be due to be refilled on 05/15/11.  Nurse stated that it would be fine to give him 1 week's worth of pain medication until his next appointment, which we scheduled for 05/21/11 at 1pm with Dr. Selena Lesser.  His pain regimen is Oxycodone 30mg  q4hr, #6 per day.  Will give #42 to last him to his appointment in 1 week.   Ryan [name] 05/14/2011 1:41 PM

## 2011-05-15 NOTE — Discharge Summary (Signed)
I discussed with Dr Mikel Cella.  I agree with their plans documented in their  Discharge Note.

## 2011-05-16 NOTE — Progress Notes (Signed)
Ryan Shrewsbury M. Felicite Zeimet, MD, FACS General, Bariatric, & Minimally Invasive Surgery [facility]  

## 2011-06-14 ENCOUNTER — Ambulatory Visit (INDEPENDENT_AMBULATORY_CARE_PROVIDER_SITE_OTHER): Payer: Medicare Other | Admitting: Family Medicine

## 2011-06-14 ENCOUNTER — Ambulatory Visit: Payer: Medicare Other

## 2011-06-14 ENCOUNTER — Encounter (HOSPITAL_COMMUNITY): Payer: Self-pay | Admitting: *Deleted

## 2011-06-14 ENCOUNTER — Emergency Department (HOSPITAL_COMMUNITY)
Admission: EM | Admit: 2011-06-14 | Discharge: 2011-06-15 | Disposition: A | Discharge status: Home / Self Care | Payer: Medicare Other | Attending: Emergency Medicine | Admitting: Emergency Medicine

## 2011-06-14 VITALS — BP 151/93 | HR 68 | Temp 98.8°F | Resp 18 | Ht 77.0 in | Wt 309.5 lb

## 2011-06-14 DIAGNOSIS — F17200 Nicotine dependence, unspecified, uncomplicated: Secondary | ICD-10-CM | POA: Insufficient documentation

## 2011-06-14 DIAGNOSIS — R05 Cough: Secondary | ICD-10-CM

## 2011-06-14 DIAGNOSIS — Z8719 Personal history of other diseases of the digestive system: Secondary | ICD-10-CM

## 2011-06-14 DIAGNOSIS — E119 Type 2 diabetes mellitus without complications: Secondary | ICD-10-CM

## 2011-06-14 DIAGNOSIS — Z86718 Personal history of other venous thrombosis and embolism: Secondary | ICD-10-CM | POA: Insufficient documentation

## 2011-06-14 DIAGNOSIS — E785 Hyperlipidemia, unspecified: Secondary | ICD-10-CM | POA: Insufficient documentation

## 2011-06-14 DIAGNOSIS — I10 Essential (primary) hypertension: Secondary | ICD-10-CM | POA: Insufficient documentation

## 2011-06-14 DIAGNOSIS — Z79899 Other long term (current) drug therapy: Secondary | ICD-10-CM | POA: Insufficient documentation

## 2011-06-14 DIAGNOSIS — J189 Pneumonia, unspecified organism: Secondary | ICD-10-CM

## 2011-06-14 DIAGNOSIS — R112 Nausea with vomiting, unspecified: Secondary | ICD-10-CM

## 2011-06-14 DIAGNOSIS — R109 Unspecified abdominal pain: Secondary | ICD-10-CM | POA: Insufficient documentation

## 2011-06-14 DIAGNOSIS — F1120 Opioid dependence, uncomplicated: Secondary | ICD-10-CM

## 2011-06-14 DIAGNOSIS — Z7982 Long term (current) use of aspirin: Secondary | ICD-10-CM | POA: Insufficient documentation

## 2011-06-14 DIAGNOSIS — R059 Cough, unspecified: Secondary | ICD-10-CM

## 2011-06-14 HISTORY — DX: Unspecified intestinal obstruction, unspecified as to partial versus complete obstruction: K56.609

## 2011-06-14 LAB — DIFFERENTIAL
Eosinophils Absolute: 0.1 10*3/uL (ref 0.0–0.7)
Eosinophils Relative: 2 % (ref 0–5)
Lymphocytes Relative: 21 % (ref 12–46)
Lymphs Abs: 1.7 10*3/uL (ref 0.7–4.0)
Monocytes Absolute: 0.5 10*3/uL (ref 0.1–1.0)
Monocytes Relative: 6 % (ref 3–12)

## 2011-06-14 LAB — CBC
HCT: 45.7 % (ref 39.0–52.0)
MCH: 31.5 pg (ref 26.0–34.0)
MCV: 95.4 fL (ref 78.0–100.0)
Platelets: 264 10*3/uL (ref 150–400)
RBC: 4.79 MIL/uL (ref 4.22–5.81)
WBC: 8.3 10*3/uL (ref 4.0–10.5)

## 2011-06-14 LAB — POCT GLYCOSYLATED HEMOGLOBIN (HGB A1C): Hemoglobin A1C: 12.5

## 2011-06-14 MED ORDER — DOXYCYCLINE HYCLATE 100 MG PO CAPS: 100.0000 mg | ORAL_CAPSULE | Freq: Two times a day (BID) | ORAL | Status: DC

## 2011-06-14 MED ORDER — SODIUM CHLORIDE 0.9 % IV BOLUS (SEPSIS)
500.0000 mL | Freq: Once | INTRAVENOUS | Status: AC
  Administered 2011-06-15: 500 mL via INTRAVENOUS

## 2011-06-14 MED ORDER — ONDANSETRON HCL 4 MG/2ML IJ SOLN
4.0000 mg | Freq: Once | INTRAMUSCULAR | Status: AC
  Administered 2011-06-15: 4 mg via INTRAVENOUS
  Filled 2011-06-14: qty 2

## 2011-06-14 MED ORDER — HYDROMORPHONE HCL PF 1 MG/ML IJ SOLN
1.0000 mg | Freq: Once | INTRAMUSCULAR | Status: AC
  Administered 2011-06-15: 1 mg via INTRAVENOUS
  Filled 2011-06-14: qty 1

## 2011-06-14 NOTE — Progress Notes (Signed)
Patient Name: JORI THRALL Date of Birth: 04-12-1951 Medical Record Number: [id number] Gender: male Date of Encounter: 06/14/2011  History of Present Illness:  JAYKUB MACKINS is a 61 y.o. male patient who presents with the following:  Please see hospital records from January 2013.  Patient was admitted with a small bowel obstruction and treated with an NG tube and bowel rest.  The SBO resolved and he was discharged to home.  He also has current problems with HTN, DM, obesity and chronic pain.  Past history of CAD with stents, DVT.   He was last seen in our clinic nearly 2 years ago, 07/12/2009.   Here today due to being 'in a lot of pain, doc."  Asks for me to give him more pain medication.  Review of the controlled substance database reveals several recent rx for large quantities of oxycodone 30mg - he received #180 on 05/28/11 and another #180 from another precriber on 05/21/11.  He also admits to having a pain contract with a pain clinic but states that he may have been discharged from them or that he is otherwise not planning to return.   Also had a cough for the last 4 or 5 days, is coughing up a little bit of phlegm.  Has had chills, has not taken his temperature.  Has had a runny nose and sneezing.  Notes that he still has a lot of pain in his stomach.  "I'm not keeping my medicaitons down, that's why i'm going through them so fast."  No diarrhea but feels like he could have diarrhea.  Does not seem to be having vomiting.  Diabetes: glucose in the 200s in the morning.  Currently on PO therapy only- not on insulin therapy.  Has never been on insulin.  Uses losartan for his hypertension.  Did have hypokalemia at his recent hospitalization as well.    Patient Active Problem List  Diagnoses  . DIABETES MELLITUS, TYPE II  . HYPERTENSION  . CORONARY ARTERY DISEASE  . GERD  . CHOLELITHIASIS  . LOW BACK PAIN  . PANCREATITIS, HX OF  . Small bowel obstruction  . Hypokalemia  . Hyperglycemia     Past Medical History  Diagnosis Date  . Hypertension   . Diabetes mellitus   . DVT (deep venous thrombosis)   . Chronic back pain   . 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 Hyperlipidemia   . CAD (coronary atherosclerotic disease)     s/p stents x2   Past Surgical History  Procedure Date  . Heart stent   . Tonsillectomy and adenoidectomy    History  Substance Use Topics  . Smoking status: Current Some Day Smoker -- 0.2 packs/day for 30 years    Types: Cigarettes  . Smokeless tobacco: Never Used  . Alcohol Use: No   Family History  Problem Relation Age of Onset  . Diabetes Father    Allergies  Allergen Reactions  . Lisinopril Swelling  . Morphine And Related Swelling    Medication list has been reviewed and updated.  Review of Systems: As per HPI, otherwise negative  Physical Examination: Filed Vitals:   06/14/11 1457  BP: 151/93  Pulse: 68  Temp: 98.8 F (37.1 C)  TempSrc: Oral  Resp: 18  Height: 6\' 5"  (1.956 m)  Weight: 309 lb 8 oz (140.388 kg)  SpO2: 96%    Body mass index is 36.70 kg/(m^2).  GEN: WDWN, NAD, Non-toxic, A & O x  3, obese, poor hygiene.  HEENT: Atraumatic, Normocephalic. Neck supple. No masses, No LAD.  PEERL Ears and Nose: No external deformity. TM wnl, oropharynx wnl CV: RRR, No M/G/R. No JVD. No thrill. No extra heart sounds. PULM: CTA B, no wheezes, crackles, rhonchi. No retractions. No resp. distress. No accessory muscle use. ABD: S, NT, ND, +BS in all quadrants. No rebound. No HSM.  gyneocomastia Back with surgical scar in the lumbar area.   EXTR: No c/c/e NEURO uses [facility] PSYCH: Normally interactive. Conversant. Not depressed or anxious appearing.   [facility] reading (PRIMARY) by  Dr. Patsy Lager.  Negative abdomen- no air fluid levels, positive increased stool burden.  Chest x-ray:  Increased markings which are consistent with a mild infiltrate in the right lower lobe.   [facility]   *RADIOLOGY REPORT*  Clinical Data: Cough and shortness of breath.  CHEST - 2 VIEW  Comparison: 11/15/2010 and 04/26/2010 and chest CT dated 11/13/2010  Findings: There is accentuation of the interstitial markings at the  right lung base with peribronchial thickening. The patient has had  faint infiltrates in that area in the past. This may represent an  early infiltrate.  Heart size and vascularity are normal. Left lung is clear. No  effusions or significant osseous abnormalities.  IMPRESSION:  Accentuated interstitial markings and haziness at the right lung  base which could represent an early infiltrate.  Original Report Authenticated By: Gwynn Burly, M.D.   Results for orders placed in visit on 06/14/11  POCT GLYCOSYLATED HEMOGLOBIN (HGB A1C)      Component Value Range   Hemoglobin A1C 12.5     Assessment and Plan: 1. H/O small bowel obstruction  DG Abd 1 View  2. Cough  DG Chest 2 View, doxycycline (VIBRAMYCIN) 100 MG capsule  3. Hypertension    4. Diabetes mellitus  POCT glycosylated hemoglobin (Hb A1C)  5. Opiate addiction    6. Pneumonia     Mr. Kubitz appears to have a mild RLL pneumonia today.  Will treat with Doxycycline as above.  Also discussed poor diabetes control and offered to start insulin.  Explained that I do not think PO therapy will be adequate to control his diabetes, as he is nearly maxed out on po therapy and his A1c is still above 12.  Mr. Freimark declined to start insulin for now. Encouraged him to really try and do well with his diet.   Hypertension: not under great control, but will not change medications now as he is ill and had hypokalemia in the hospital.   SBO does not seem to have returned as he has active bowel sounds, a benign abdomen and normal x-ray.     Mr. Shellhammer asked me repeatedly for different narcotics by name, including morphine (which he is apparently allergic to), vicodin, oxycodone, suboxone and methadone. I declinded to write for  any narcotics, as he has recenly received large amounts of oxycodone from at least 2 different providers.  Multiple different addresses and pharmacies used.  I let him know that our clinic will not prescribe him narcotics in the future.  Offered him resources to help treat opoid addiction, but he is currently not interested in this.  Encouraged him to follow- up regularly with his primary care provider, especially if his cough is not better in a few days, Sooner if worse.

## 2011-06-14 NOTE — ED Notes (Signed)
C/o abd pain, nv, also "a little diarrhea & fever". Onset 3d ago, "constant stabbing pain". States, "last time I felt this way i had a SBO". Vomiting now.

## 2011-06-15 ENCOUNTER — Emergency Department (HOSPITAL_COMMUNITY)
Admission: EM | Admit: 2011-06-15 | Discharge: 2011-06-15 | Disposition: A | Discharge status: Home / Self Care | Payer: Medicare Other | Attending: Emergency Medicine | Admitting: Emergency Medicine

## 2011-06-15 ENCOUNTER — Emergency Department (HOSPITAL_COMMUNITY): Payer: Medicare Other

## 2011-06-15 ENCOUNTER — Encounter (HOSPITAL_COMMUNITY): Payer: Self-pay | Admitting: Emergency Medicine

## 2011-06-15 DIAGNOSIS — R10816 Epigastric abdominal tenderness: Secondary | ICD-10-CM | POA: Insufficient documentation

## 2011-06-15 DIAGNOSIS — E119 Type 2 diabetes mellitus without complications: Secondary | ICD-10-CM | POA: Insufficient documentation

## 2011-06-15 DIAGNOSIS — R630 Anorexia: Secondary | ICD-10-CM | POA: Insufficient documentation

## 2011-06-15 DIAGNOSIS — I10 Essential (primary) hypertension: Secondary | ICD-10-CM | POA: Insufficient documentation

## 2011-06-15 DIAGNOSIS — F17200 Nicotine dependence, unspecified, uncomplicated: Secondary | ICD-10-CM | POA: Insufficient documentation

## 2011-06-15 DIAGNOSIS — E669 Obesity, unspecified: Secondary | ICD-10-CM | POA: Insufficient documentation

## 2011-06-15 DIAGNOSIS — R109 Unspecified abdominal pain: Secondary | ICD-10-CM | POA: Insufficient documentation

## 2011-06-15 DIAGNOSIS — Z86718 Personal history of other venous thrombosis and embolism: Secondary | ICD-10-CM | POA: Insufficient documentation

## 2011-06-15 DIAGNOSIS — Z79899 Other long term (current) drug therapy: Secondary | ICD-10-CM | POA: Insufficient documentation

## 2011-06-15 DIAGNOSIS — R059 Cough, unspecified: Secondary | ICD-10-CM | POA: Insufficient documentation

## 2011-06-15 DIAGNOSIS — E785 Hyperlipidemia, unspecified: Secondary | ICD-10-CM | POA: Insufficient documentation

## 2011-06-15 DIAGNOSIS — R112 Nausea with vomiting, unspecified: Secondary | ICD-10-CM | POA: Insufficient documentation

## 2011-06-15 DIAGNOSIS — R05 Cough: Secondary | ICD-10-CM | POA: Insufficient documentation

## 2011-06-15 DIAGNOSIS — I2510 Atherosclerotic heart disease of native coronary artery without angina pectoris: Secondary | ICD-10-CM | POA: Insufficient documentation

## 2011-06-15 DIAGNOSIS — R0602 Shortness of breath: Secondary | ICD-10-CM | POA: Insufficient documentation

## 2011-06-15 DIAGNOSIS — Z7982 Long term (current) use of aspirin: Secondary | ICD-10-CM | POA: Insufficient documentation

## 2011-06-15 LAB — LIPASE, BLOOD: Lipase: 33 U/L (ref 11–59)

## 2011-06-15 LAB — CBC
HCT: 43.8 % (ref 39.0–52.0)
HCT: 45.3 % (ref 39.0–52.0)
MCH: 31.9 pg (ref 26.0–34.0)
MCH: 32.1 pg (ref 26.0–34.0)
MCHC: 33.3 g/dL (ref 30.0–36.0)
MCV: 95.6 fL (ref 78.0–100.0)
MCV: 96.9 fL (ref 78.0–100.0)
Platelets: 257 10*3/uL (ref 150–400)
Platelets: 264 10*3/uL (ref 150–400)
RDW: 14.2 % (ref 11.5–15.5)
RDW: 14.6 % (ref 11.5–15.5)

## 2011-06-15 LAB — URINE MICROSCOPIC-ADD ON

## 2011-06-15 LAB — COMPREHENSIVE METABOLIC PANEL
ALT: 5 U/L (ref 0–53)
AST: 11 U/L (ref 0–37)
AST: 8 U/L (ref 0–37)
Albumin: 3.5 g/dL (ref 3.5–5.2)
CO2: 27 mEq/L (ref 19–32)
Calcium: 9.7 mg/dL (ref 8.4–10.5)
Chloride: 103 mEq/L (ref 96–112)
Creatinine, Ser: 0.68 mg/dL (ref 0.50–1.35)
Creatinine, Ser: 0.7 mg/dL (ref 0.50–1.35)
GFR calc Af Amer: >90 mL/min (ref >90)
GFR calc non Af Amer: >90 mL/min (ref >90)
Glucose, Bld: 314 mg/dL — ABNORMAL HIGH (ref 70–99)
Potassium: 4.1 mEq/L (ref 3.5–5.1)
Sodium: 139 mEq/L (ref 135–145)
Total Bilirubin: 0.3 mg/dL (ref 0.3–1.2)
Total Protein: 7.4 g/dL (ref 6.0–8.3)
Total Protein: 6.8 g/dL (ref 6.0–8.3)

## 2011-06-15 LAB — DIFFERENTIAL
Basophils Absolute: 0 10*3/uL (ref 0.0–0.1)
Eosinophils Absolute: 0.1 10*3/uL (ref 0.0–0.7)
Eosinophils Relative: 2 % (ref 0–5)
Lymphocytes Relative: 31 % (ref 12–46)
Monocytes Absolute: 0.5 10*3/uL (ref 0.1–1.0)

## 2011-06-15 LAB — URINALYSIS, ROUTINE W REFLEX MICROSCOPIC
Glucose, UA: >1000 mg/dL — AB
Hgb urine dipstick: NEGATIVE
Leukocytes, UA: NEGATIVE
Protein, ur: NEGATIVE mg/dL
Specific Gravity, Urine: >1.046 — ABNORMAL HIGH (ref 1.005–1.030)
pH: 7 (ref 5.0–8.0)

## 2011-06-15 MED ORDER — HYDROMORPHONE HCL PF 1 MG/ML IJ SOLN
1.0000 mg | Freq: Once | INTRAMUSCULAR | Status: AC
  Administered 2011-06-15: 1 mg via INTRAVENOUS
  Filled 2011-06-15: qty 1

## 2011-06-15 MED ORDER — SODIUM CHLORIDE 0.9 % IV SOLN
1000.0000 mL | INTRAVENOUS | Status: DC
  Administered 2011-06-15: 1000 mL via INTRAVENOUS

## 2011-06-15 MED ORDER — IOHEXOL 300 MG/ML  SOLN
40.0000 mL | Freq: Once | INTRAMUSCULAR | Status: AC | PRN
  Administered 2011-06-15: 40 mL via ORAL

## 2011-06-15 MED ORDER — IOHEXOL 300 MG/ML  SOLN
100.0000 mL | Freq: Once | INTRAMUSCULAR | Status: AC | PRN
  Administered 2011-06-15: 100 mL via INTRAVENOUS

## 2011-06-15 MED ORDER — OXYCODONE-ACETAMINOPHEN 5-325 MG PO TABS
2.0000 | ORAL_TABLET | Freq: Once | ORAL | Status: AC
  Administered 2011-06-15: 2 via ORAL
  Filled 2011-06-15: qty 2

## 2011-06-15 MED ORDER — ONDANSETRON HCL 4 MG/2ML IJ SOLN
4.0000 mg | Freq: Once | INTRAMUSCULAR | Status: AC
  Administered 2011-06-15: 4 mg via INTRAVENOUS
  Filled 2011-06-15: qty 2

## 2011-06-15 MED ORDER — PROMETHAZINE HCL 25 MG/ML IJ SOLN
25.0000 mg | INTRAMUSCULAR | Status: AC
  Administered 2011-06-15: 25 mg via INTRAVENOUS
  Filled 2011-06-15: qty 1

## 2011-06-15 MED ORDER — HYDROMORPHONE HCL PF 1 MG/ML IJ SOLN
2.0000 mg | INTRAMUSCULAR | Status: AC | PRN
  Administered 2011-06-15 (×2): 2 mg via INTRAVENOUS
  Filled 2011-06-15 (×2): qty 2

## 2011-06-15 MED ORDER — SODIUM CHLORIDE 0.9 % IV SOLN
1000.0000 mL | Freq: Once | INTRAVENOUS | Status: AC
  Administered 2011-06-15: 1000 mL via INTRAVENOUS

## 2011-06-15 NOTE — Discharge Instructions (Signed)

## 2011-06-15 NOTE — ED Notes (Signed)
Pt reports generalized abdominal pain x 5-6 days ago. Pt seen here last night for same. Pt reports unable to tolerate PO food, meds or fluids.

## 2011-06-15 NOTE — ED Provider Notes (Signed)
History     CSN: [id number]  Arrival date & time 06/14/11  2224   First MD Initiated Contact with Patient 06/14/11 2350      Chief Complaint  Patient presents with  . Abdominal Pain  . Emesis    (Consider location/radiation/quality/duration/timing/severity/associated sxs/prior treatment) HPI Comments: Patient presents complaining of diffuse abdominal pain with associated nausea and vomiting.  Patient states going on for the last 3-4 days.  He notes that his last bowel movement was today and it was normal for him.  He states he's had emesis that has kept her from keeping on his chronic pain meds which is oxycodone 30 mg.  Patient denies any coffee-ground emesis and notes only a small amount of blood-streaked through his vomit.  No black or bloody stools.  No fevers.  Patient was seen by his primary care physician today which appears to be the family practice service and they do not believe he was an SBO at that time.  They did not give the patient any further pain medications despite his request because they looked him up in the narcotics database and found that he had been dispensed a total of 180 tablets of 30 mg of oxycodone on January 22 and again on January 29 by a different  Provider.  They did believe that he had some pneumonia and placed him on doxycycline per their note.  Patient is a 61 y.o. male presenting with abdominal pain and vomiting. The history is provided by the patient.  Abdominal Pain The primary symptoms of the illness include abdominal pain, nausea and vomiting. The primary symptoms of the illness do not include fever, fatigue, shortness of breath, diarrhea, hematemesis, hematochezia or dysuria. The current episode started more than 2 days ago. The onset of the illness was gradual. The problem has been gradually worsening.  The patient states that she believes she is currently not pregnant. The patient has not had a change in bowel habit. Symptoms associated with the  illness do not include chills, anorexia, diaphoresis, heartburn, constipation, urgency, hematuria, frequency or back pain.  Emesis  Associated symptoms include abdominal pain. Pertinent negatives include no chills, no cough, no diarrhea, no fever and no headaches.    Past Medical History  Diagnosis Date  . Hypertension   . Diabetes mellitus   . DVT (deep venous thrombosis)   . Chronic back pain   . 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 Hyperlipidemia   . CAD (coronary atherosclerotic disease)     s/p stents x2  . Small bowel obstruction     Past Surgical History  Procedure Date  . Heart stent   . Tonsillectomy and adenoidectomy     Family History  Problem Relation Age of Onset  . Diabetes Father     History  Substance Use Topics  . Smoking status: Current Some Day Smoker -- 0.2 packs/day for 30 years    Types: Cigarettes  . Smokeless tobacco: Never Used  . Alcohol Use: No      Review of Systems  Constitutional: Negative.  Negative for fever, chills, diaphoresis and fatigue.  HENT: Negative.   Eyes: Negative.  Negative for discharge and redness.  Respiratory: Negative.  Negative for cough and shortness of breath.   Cardiovascular: Negative.  Negative for chest pain.  Gastrointestinal: Positive for nausea, vomiting and abdominal pain. Negative for heartburn, diarrhea, constipation, hematochezia, anorexia and hematemesis.  Genitourinary: Negative.  Negative for dysuria, urgency, frequency and hematuria.  Musculoskeletal: Negative.  Negative for back pain.  Skin: Negative.  Negative for color change and rash.  Neurological: Negative for syncope and headaches.  Hematological: Negative.  Negative for adenopathy.  Psychiatric/Behavioral: Negative.  Negative for confusion.  All other systems reviewed and are negative.    Allergies  Lisinopril and Morphine and related  Home Medications   Current Outpatient Rx  Name Route Sig Dispense  Refill  . ASPIRIN EC 81 MG PO TBEC Oral Take 81 mg by mouth daily.    Marland Kitchen GABAPENTIN 400 MG PO CAPS Oral Take 400 mg by mouth 3 (three) times daily.      Marland Kitchen GLIPIZIDE 10 MG PO TABS Oral Take 10 mg by mouth 2 (two) times daily before a meal.      . LOSARTAN POTASSIUM 100 MG PO TABS Oral Take 100 mg by mouth daily.    Marland Kitchen METFORMIN HCL 1000 MG PO TABS Oral Take 1,000 mg by mouth 2 (two) times daily with a meal.      . OXYCODONE HCL 30 MG PO TABS Oral Take 30 mg by mouth every 4 (four) hours as needed. For pain    . RANITIDINE HCL 300 MG PO CAPS Oral Take 300 mg by mouth 2 (two) times daily.      Marland Kitchen DOXYCYCLINE HYCLATE 100 MG PO CAPS Oral Take 100 mg by mouth 2 (two) times daily.      BP 142/89  Pulse 91  Temp(Src) 98.5 F (36.9 C) (Oral)  Resp 18  SpO2 93%  Physical Exam  Nursing note and vitals reviewed. Constitutional: He is oriented to person, place, and time. He appears well-developed and well-nourished.  Non-toxic appearance. He does not have a sickly appearance.  HENT:  Head: Normocephalic and atraumatic.  Eyes: Conjunctivae, EOM and lids are normal. Pupils are equal, round, and reactive to light.  Neck: Trachea normal, normal range of motion and full passive range of motion without pain. Neck supple.  Cardiovascular: Normal rate, regular rhythm and normal heart sounds.   Pulmonary/Chest: Effort normal and breath sounds normal. No respiratory distress.  Abdominal: Soft. Normal appearance. He exhibits no distension. There is no tenderness. There is no rebound and no CVA tenderness.  Musculoskeletal: Normal range of motion.  Neurological: He is alert and oriented to person, place, and time. He has normal strength.  Skin: Skin is warm, dry and intact. No rash noted.  Psychiatric: He has a normal mood and affect. His behavior is normal. Judgment and thought content normal.    ED Course  Procedures (including critical care time)  Results for orders placed during the hospital encounter  of 06/14/11  COMPREHENSIVE METABOLIC PANEL      Component Value Range   Sodium 135  135 - 145 (mEq/L)   Potassium 4.1  3.5 - 5.1 (mEq/L)   Chloride 103  96 - 112 (mEq/L)   CO2 24  19 - 32 (mEq/L)   Glucose, Bld 336 (*) 70 - 99 (mg/dL)   BUN 7  6 - 23 (mg/dL)   Creatinine, Ser 9.14  0.50 - 1.35 (mg/dL)   Calcium 9.9  8.4 - 78.2 (mg/dL)   Total Protein 7.4  6.0 - 8.3 (g/dL)   Albumin 3.5  3.5 - 5.2 (g/dL)   AST 11  0 - 37 (U/L)   ALT 7  0 - 53 (U/L)   Alkaline Phosphatase 76  39 - 117 (U/L)   Total Bilirubin 0.3  0.3 - 1.2 (mg/dL)   GFR calc  non Af Amer >90  >90 (mL/min)   GFR calc Af Amer >90  >90 (mL/min)  CBC      Component Value Range   WBC 8.3  4.0 - 10.5 (K/uL)   RBC 4.79  4.22 - 5.81 (MIL/uL)   Hemoglobin 15.1  13.0 - 17.0 (g/dL)   HCT 21.3  08.6 - 57.8 (%)   MCV 95.4  78.0 - 100.0 (fL)   MCH 31.5  26.0 - 34.0 (pg)   MCHC 33.0  30.0 - 36.0 (g/dL)   RDW 46.9  62.9 - 52.8 (%)   Platelets 264  150 - 400 (K/uL)  DIFFERENTIAL      Component Value Range   Neutrophils Relative 72  43 - 77 (%)   Neutro Abs 6.0  1.7 - 7.7 (K/uL)   Lymphocytes Relative 21  12 - 46 (%)   Lymphs Abs 1.7  0.7 - 4.0 (K/uL)   Monocytes Relative 6  3 - 12 (%)   Monocytes Absolute 0.5  0.1 - 1.0 (K/uL)   Eosinophils Relative 2  0 - 5 (%)   Eosinophils Absolute 0.1  0.0 - 0.7 (K/uL)   Basophils Relative 0  0 - 1 (%)   Basophils Absolute 0.0  0.0 - 0.1 (K/uL)  CBC      Component Value Range   WBC 8.1  4.0 - 10.5 (K/uL)   RBC 4.74  4.22 - 5.81 (MIL/uL)   Hemoglobin 15.1  13.0 - 17.0 (g/dL)   HCT 41.3  24.4 - 01.0 (%)   MCV 95.6  78.0 - 100.0 (fL)   MCH 31.9  26.0 - 34.0 (pg)   MCHC 33.3  30.0 - 36.0 (g/dL)   RDW 27.2  53.6 - 64.4 (%)   Platelets 264  150 - 400 (K/uL)  LIPASE, BLOOD      Component Value Range   Lipase 33  11 - 59 (U/L)  LACTIC ACID, PLASMA      Component Value Range   Lactic Acid, Venous 1.4  0.5 - 2.2 (mmol/L)   Dg Chest 2 View  06/14/2011  [facility] *RADIOLOGY REPORT*  Clinical Data: Cough and shortness of breath.  CHEST - 2 VIEW  Comparison: 11/15/2010 and 04/26/2010 and chest CT dated 11/13/2010  Findings: There is accentuation of the interstitial markings at the right lung base with peribronchial thickening.  The patient has had faint infiltrates in that area in the past.  This may represent an early infiltrate.  Heart size and vascularity are normal.  Left lung is clear.  No effusions or significant osseous abnormalities.  IMPRESSION: Accentuated interstitial markings and haziness at the right lung base which could represent an early infiltrate.  Original Report Authenticated By: Gwynn Burly, M.D.   Dg Abd 1 View  06/14/2011  [facility] *RADIOLOGY REPORT*  Clinical Data: History of recent small bowel obstruction.  ABDOMEN - 1 VIEW  Comparison: Radiographs dated 05/14/2011 and CT scan dated 05/12/2011  Findings: There are no dilated loops of large or small bowel.  No worrisome abdominal calcifications.  Prior lumbar fusion at L4-5. Prior cholecystectomy.  IMPRESSION: Benign-appearing abdomen.  Original Report Authenticated By: Gwynn Burly, M.D.        MDM  Patient presents complaining of abdominal pain but a story that does not appear to be consistent with small bowel obstruction as the patient had a normal bowel movement today.  He has an acute abdominal series that is not consistent  with SBO 80.  Patient has normal vital signs and normal laboratory studies today.  The lactate is normal and makes it unlikely patient has acute mesenteric ischemia.  Patient has no other symptoms to suggest acute infection.  He has no elevated LFTs or lipase to suggest hepatitis or pancreatitis.  Patient has now passed a by mouth challenge and states that his pain is tolerable and his nausea is under control he'll be able to be discharged home.        Nat Christen, MD 06/15/11 [phone]

## 2011-06-15 NOTE — ED Notes (Addendum)
Pt given 2 packs of sugar free cookies

## 2011-06-15 NOTE — ED Notes (Signed)
Pt c/o n/v/d for the past 3 days.  Vomited in triage, no vomiting now.  Pt c/o 8/10 abdominal pain that is constant.  Denies SOB, CP at this time.  Uses cane to walk

## 2011-06-15 NOTE — ED Provider Notes (Signed)
History     CSN: [id number]  Arrival date & time 06/15/11  1432   First MD Initiated Contact with Patient 06/15/11 1522      Chief Complaint  Patient presents with  . Abdominal Pain  . Nausea  . Emesis    (Consider location/radiation/quality/duration/timing/severity/associated sxs/prior treatment) Patient is a 61 y.o. male presenting with abdominal pain and vomiting. The history is provided by the patient.  Abdominal Pain The primary symptoms of the illness include abdominal pain, nausea and vomiting. The primary symptoms of the illness do not include diarrhea. The current episode started more than 2 days ago. The onset of the illness was gradual. The problem has been gradually worsening.  The abdominal pain radiates to the epigastric region. The abdominal pain is relieved by nothing. The abdominal pain is exacerbated by eating.  Additional symptoms associated with the illness include anorexia. Symptoms associated with the illness do not include constipation.  Emesis  The emesis has an appearance of stomach contents. There has been no fever. Associated symptoms include abdominal pain. Pertinent negatives include no cough and no diarrhea.  Pt has history of prior bowel obstructions and pancreatitis.  He was seen yesterday in the ED and was released after an evaluation.  Pt was able to eat in the ED but today he started having vomiting again and more pain.  Pt last had a BM last night.    Past Medical History  Diagnosis Date  . Hypertension   . Diabetes mellitus   . DVT (deep venous thrombosis)   . Chronic back pain   . 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 Hyperlipidemia   . CAD (coronary atherosclerotic disease)     s/p stents x2  . Small bowel obstruction     Past Surgical History  Procedure Date  . Heart stent   . Tonsillectomy and adenoidectomy   . Cholecystectomy     Family History  Problem Relation Age of Onset  . Diabetes Father      History  Substance Use Topics  . Smoking status: Current Some Day Smoker -- 0.2 packs/day for 30 years    Types: Cigarettes  . Smokeless tobacco: Never Used  . Alcohol Use: No      Review of Systems  Respiratory: Negative for cough.   Gastrointestinal: Positive for nausea, vomiting, abdominal pain and anorexia. Negative for diarrhea and constipation.  All other systems reviewed and are negative.    Allergies  Lisinopril and Morphine and related  Home Medications   Current Outpatient Rx  Name Route Sig Dispense Refill  . ASPIRIN EC 81 MG PO TBEC Oral Take 81 mg by mouth daily.    Marland Kitchen DOXYCYCLINE HYCLATE 100 MG PO CAPS Oral Take 100 mg by mouth 2 (two) times daily.    Marland Kitchen GABAPENTIN 400 MG PO CAPS Oral Take 400 mg by mouth 3 (three) times daily.      Marland Kitchen GLIPIZIDE 10 MG PO TABS Oral Take 10 mg by mouth 2 (two) times daily before a meal.      . LOSARTAN POTASSIUM 100 MG PO TABS Oral Take 100 mg by mouth daily.    Marland Kitchen METFORMIN HCL 1000 MG PO TABS Oral Take 1,000 mg by mouth 2 (two) times daily with a meal.      . OXYCODONE HCL 30 MG PO TABS Oral Take 30 mg by mouth every 4 (four) hours as needed. For pain    . RANITIDINE HCL 300  MG PO CAPS Oral Take 300 mg by mouth 2 (two) times daily.        BP 151/74  Pulse 95  Temp(Src) 98.4 F (36.9 C) (Oral)  Resp 18  SpO2 92%  Physical Exam  Nursing note and vitals reviewed. Constitutional: He appears well-developed. No distress.       obese  HENT:  Head: Normocephalic and atraumatic.  Right Ear: External ear normal.  Left Ear: External ear normal.  Eyes: Conjunctivae are normal. Right eye exhibits no discharge. Left eye exhibits no discharge. No scleral icterus.  Neck: Neck supple. No tracheal deviation present.  Cardiovascular: Normal rate, regular rhythm and intact distal pulses.   Pulmonary/Chest: Effort normal and breath sounds normal. No stridor. No respiratory distress. He has no wheezes. He has no rales.  Abdominal: Soft.  Bowel sounds are normal. He exhibits no distension. There is no hepatosplenomegaly. There is tenderness in the epigastric area. There is no rebound and no guarding. No hernia.  Musculoskeletal: He exhibits no edema and no tenderness.  Neurological: He is alert. He has normal strength. No sensory deficit. Cranial nerve deficit:  no gross defecits noted. He exhibits normal muscle tone. He displays no seizure activity. Coordination normal.  Skin: Skin is warm and dry. No rash noted.  Psychiatric: He has a normal mood and affect.    ED Course  Procedures (including critical care time)  Labs Reviewed  COMPREHENSIVE METABOLIC PANEL - Abnormal; Notable for the following:    Glucose, Bld 314 (*)    Albumin 3.0 (*)    Total Bilirubin 0.2 (*)    All other components within normal limits  URINALYSIS, ROUTINE W REFLEX MICROSCOPIC - Abnormal; Notable for the following:    Specific Gravity, Urine >1.046 (*)    Glucose, UA >1000 (*)    All other components within normal limits  CBC  DIFFERENTIAL  LIPASE, BLOOD  URINE MICROSCOPIC-ADD ON    Dg Chest 2 View  06/14/2011  [facility] *RADIOLOGY REPORT*  Clinical Data: Cough and shortness of breath.  CHEST - 2 VIEW  Comparison: 11/15/2010 and 04/26/2010 and chest CT dated 11/13/2010  Findings: There is accentuation of the interstitial markings at the right lung base with peribronchial thickening.  The patient has had faint infiltrates in that area in the past.  This may represent an early infiltrate.  Heart size and vascularity are normal.  Left lung is clear.  No effusions or significant osseous abnormalities.  IMPRESSION: Accentuated interstitial markings and haziness at the right lung base which could represent an early infiltrate.  Original Report Authenticated By: Gwynn Burly, M.D.   Dg Abd 1 View  06/14/2011  [facility] *RADIOLOGY REPORT*  Clinical Data: History of recent small bowel obstruction.   ABDOMEN - 1 VIEW  Comparison: Radiographs dated 05/14/2011 and CT scan dated 05/12/2011  Findings: There are no dilated loops of large or small bowel.  No worrisome abdominal calcifications.  Prior lumbar fusion at L4-5. Prior cholecystectomy.  IMPRESSION: Benign-appearing abdomen.  Original Report Authenticated By: Gwynn Burly, M.D.   Ct Abdomen Pelvis W Contrast  06/15/2011  *RADIOLOGY REPORT*  Clinical Data: Abdominal pain, nausea, and emesis.  CT ABDOMEN AND PELVIS WITH CONTRAST  Technique:  Multidetector CT imaging of the abdomen and pelvis was performed following the standard protocol during bolus administration of intravenous contrast.  Contrast: OMNIPAQUE IOHEXOL 300 MG/ML IV SOLN  Comparison: CT abdomen pelvis 05/12/2011  Findings: There are scattered atelectasis  at the lung bases. Negative for pleural effusion.  Probable remote healed left rib fracture on image #1.  The stomach is better distended on today's examination.  No areas of wall thickening are suspected.  The liver is homogeneous and enhancement.  No focal hepatic lesion or biliary ductal dilatation. Patient is status post cholecystectomy.  Stable probable splenule. Normal appearance of the spleen.  Normal adrenal glands, right kidney, and pancreas.  Low density lesion in the medial mid pole of the left kidney and smaller low density lesion in the lateral mid pole of the left kidney are stable.  These are likely cysts.  There is no hydronephrosis.  Small bowel loops are normal in caliber.  No small bowel wall thickening.  Normal terminal ileum.  Colon is normal in caliber. There are mild diverticular changes of the sigmoid colon.  No acute inflammatory changes.  Rectum is normal.  Prostate gland is normal in size and contains central calcification.  Urinary bladder is slightly distended and appears within normal limits.  There is no ascites, lymphadenopathy, or free intraperitoneal air.  No acute findings of the abdominal wall.   Postsurgical changes of posterior fusion at L4-L5 posterior rods and pedicle screws, stable.  There is stable scarring in the subcutaneous fat of the back, overlying the level of the posterior fusion.  No acute bony abnormality.  IMPRESSION:  1.  No acute findings in the abdomen or pelvis. 2.  Stable low density lesions in the left kidney are likely cysts. 3.  Stable postsurgical changes of the lower lumbar spine.  Original Report Authenticated By: Britta Mccreedy, M.D.   Dg Abd Acute W/chest  06/15/2011  *RADIOLOGY REPORT*  Clinical Data: Abdominal pain, nausea, vomiting and diarrhea. History of diabetes.  ACUTE ABDOMEN SERIES (ABDOMEN 2 VIEW & CHEST 1 VIEW)  Comparison: Chest and abdominal radiographs performed 06/14/2011  Findings: The lungs are well-aerated.  New bilateral central and bibasilar airspace opacities raise concern for pulmonary edema, given underlying vascular congestion, though pneumonia could have a similar appearance.  There is no evidence of pleural effusion or pneumothorax.  The cardiomediastinal silhouette is within normal limits.  The visualized bowel gas pattern is unremarkable.  Stool and air are noted throughout the colon; there is no evidence of small bowel dilatation to suggest obstruction.  No free intra-abdominal air is identified on the provided upright view.  Clips are noted within the right upper quadrant, reflecting prior cholecystectomy.  No acute osseous abnormalities are seen; the sacroiliac joints are unremarkable in appearance.  Lumbar spinal fusion hardware is noted.  IMPRESSION:  1.  New bilateral central and bibasilar airspace opacities raise concern for pulmonary edema, given underlying vascular congestion, though pneumonia could have a similar appearance. 2.  Unremarkable bowel gas pattern; no free intra-abdominal air seen.  Original Report Authenticated By: Tonia Ghent, M.D.    1. Abdominal pain       MDM  Patient has had an extensive workup here in the  emergency department. The CT scan did not show any evidence of acute abnormality. There is no evidence of bowel obstruction. His laboratory tests are unremarkable as well with the exception of his elevated blood sugar. I reviewed his outpatient notes and that is being cared for by his Dr.  Donn Pierini most recent records also indicates that he has been having issues with chronic pain and had requested multiple opiate  medications from his outpatient doctor.  This may be a component in the abdominal pain that he is  having today. At this point there does not appear to be evidence of an acute emergency medical condition. He will be discharged home and I encouraged followup with his primary care Dr., pain management doctors and possible GI Dr.        Celene Kras, MD 06/15/11 [phone]

## 2011-06-15 NOTE — ED Notes (Signed)
Pt requesting more pain medication. Informed pt that he could not receive more now since last dose was 1hr ago and he would have to wait another hour. Pt stated that the previous medicine was working, but was beginning to wear off.

## 2011-06-15 NOTE — Discharge Instructions (Signed)
Abdominal Pain Abdominal pain can be caused by many things. Your caregiver decides the seriousness of your pain by an examination and possibly blood tests and X-rays. Many cases can be observed and treated at home. Most abdominal pain is not caused by a disease and will probably improve without treatment. However, in many cases, more time must pass before a clear cause of the pain can be found. Before that point, it may not be known if you need more testing, or if hospitalization or surgery is needed. HOME CARE INSTRUCTIONS   Do not take laxatives unless directed by your caregiver.   Take pain medicine only as directed by your caregiver.   Only take over-the-counter or prescription medicines for pain, discomfort, or fever as directed by your caregiver.   Try a clear liquid diet (broth, tea, or water) for as long as directed by your caregiver. Slowly move to a bland diet as tolerated.  SEEK IMMEDIATE MEDICAL CARE IF:   The pain does not go away.   You have a fever.   You keep throwing up (vomiting).   The pain is felt only in portions of the abdomen. Pain in the right side could possibly be appendicitis. In an adult, pain in the left lower portion of the abdomen could be colitis or diverticulitis.   You pass bloody or black tarry stools.  MAKE SURE YOU:   Understand these instructions.   Will watch your condition.   Will get help right away if you are not doing well or get worse.  Document Released: 01/23/2005 Document Revised: 12/26/2010 Document Reviewed: 12/02/2007 [employer] Patient Information 2012 [employer].Nausea and Vomiting Nausea is a sick feeling that often comes before throwing up (vomiting). Vomiting is a reflex where stomach contents come out of your mouth. Vomiting can cause severe loss of body fluids (dehydration). Children and elderly adults can become dehydrated quickly, especially if they also have diarrhea. Nausea and vomiting are symptoms of a condition or  disease. It is important to find the cause of your symptoms. CAUSES   Direct irritation of the stomach lining. This irritation can result from increased acid production (gastroesophageal reflux disease), infection, food poisoning, taking certain medicines (such as nonsteroidal anti-inflammatory drugs), alcohol use, or tobacco use.   Signals from the brain.These signals could be caused by a headache, heat exposure, an inner ear disturbance, increased pressure in the brain from injury, infection, a tumor, or a concussion, pain, emotional stimulus, or metabolic problems.   An obstruction in the gastrointestinal tract (bowel obstruction).   Illnesses such as diabetes, hepatitis, gallbladder problems, appendicitis, kidney problems, cancer, sepsis, atypical symptoms of a heart attack, or eating disorders.   Medical treatments such as chemotherapy and radiation.   Receiving medicine that makes you sleep (general anesthetic) during surgery.  DIAGNOSIS Your caregiver may ask for tests to be done if the problems do not improve after a few days. Tests may also be done if symptoms are severe or if the reason for the nausea and vomiting is not clear. Tests may include:  Urine tests.   Blood tests.   Stool tests.   Cultures (to look for evidence of infection).   X-rays or other imaging studies.  Test results can help your caregiver make decisions about treatment or the need for additional tests. TREATMENT You need to stay well hydrated. Drink frequently but in small amounts.You may wish to drink water, sports drinks, clear broth, or eat frozen ice pops or gelatin dessert to help stay   hydrated.When you eat, eating slowly may help prevent nausea.There are also some antinausea medicines that may help prevent nausea. HOME CARE INSTRUCTIONS   Take all medicine as directed by your caregiver.   If you do not have an appetite, do not force yourself to eat. However, you must continue to drink fluids.     If you have an appetite, eat a normal diet unless your caregiver tells you differently.   Eat a variety of complex carbohydrates (rice, wheat, potatoes, bread), lean meats, yogurt, fruits, and vegetables.   Avoid high-fat foods because they are more difficult to digest.   Drink enough water and fluids to keep your urine clear or pale yellow.   If you are dehydrated, ask your caregiver for specific rehydration instructions. Signs of dehydration may include:   Severe thirst.   Dry lips and mouth.   Dizziness.   Dark urine.   Decreasing urine frequency and amount.   Confusion.   Rapid breathing or pulse.  SEEK IMMEDIATE MEDICAL CARE IF:   You have blood or brown flecks (like coffee grounds) in your vomit.   You have black or bloody stools.   You have a severe headache or stiff neck.   You are confused.   You have severe abdominal pain.   You have chest pain or trouble breathing.   You do not urinate at least once every 8 hours.   You develop cold or clammy skin.   You continue to vomit for longer than 24 to 48 hours.   You have a fever.  MAKE SURE YOU:   Understand these instructions.   Will watch your condition.   Will get help right away if you are not doing well or get worse.  Document Released: 04/15/2005 Document Revised: 12/26/2010 Document Reviewed: 09/12/2010 [employer] Patient Information 2012 [employer]. 

## 2011-06-15 NOTE — ED Notes (Signed)
Rx x 0, pt voiced understanding to f/u with PCP in 1 week and return for worsening of condition'

## 2011-06-15 NOTE — ED Notes (Signed)
Pt d/c in NAD. Pt states that he has ride to pick him up. Pt voiced understanding of d/c instructions.

## 2011-06-15 NOTE — ED Notes (Signed)
Pt c/o abd pain. Pt states that he has been having trouble with abd intermittently for a couple months. Pt states that he has had this happen before and he has bowel obstruction. Pt states that this "spell" has been going on 3days. Pt states he has been having n/v. LBM: last night.

## 2011-07-11 ENCOUNTER — Encounter (HOSPITAL_COMMUNITY): Payer: Self-pay | Admitting: *Deleted

## 2011-07-11 ENCOUNTER — Emergency Department (HOSPITAL_COMMUNITY)
Admission: EM | Admit: 2011-07-11 | Discharge: 2011-07-12 | Disposition: A | Discharge status: Home / Self Care | Payer: Medicare Other | Attending: Emergency Medicine | Admitting: Emergency Medicine

## 2011-07-11 ENCOUNTER — Emergency Department (HOSPITAL_COMMUNITY): Payer: Medicare Other

## 2011-07-11 DIAGNOSIS — R5381 Other malaise: Secondary | ICD-10-CM | POA: Insufficient documentation

## 2011-07-11 DIAGNOSIS — I10 Essential (primary) hypertension: Secondary | ICD-10-CM | POA: Insufficient documentation

## 2011-07-11 DIAGNOSIS — M549 Dorsalgia, unspecified: Secondary | ICD-10-CM | POA: Insufficient documentation

## 2011-07-11 DIAGNOSIS — R112 Nausea with vomiting, unspecified: Secondary | ICD-10-CM | POA: Insufficient documentation

## 2011-07-11 DIAGNOSIS — E119 Type 2 diabetes mellitus without complications: Secondary | ICD-10-CM | POA: Insufficient documentation

## 2011-07-11 DIAGNOSIS — R231 Pallor: Secondary | ICD-10-CM | POA: Insufficient documentation

## 2011-07-11 DIAGNOSIS — I2510 Atherosclerotic heart disease of native coronary artery without angina pectoris: Secondary | ICD-10-CM | POA: Insufficient documentation

## 2011-07-11 DIAGNOSIS — G8929 Other chronic pain: Secondary | ICD-10-CM | POA: Insufficient documentation

## 2011-07-11 DIAGNOSIS — K117 Disturbances of salivary secretion: Secondary | ICD-10-CM | POA: Insufficient documentation

## 2011-07-11 DIAGNOSIS — R109 Unspecified abdominal pain: Secondary | ICD-10-CM | POA: Insufficient documentation

## 2011-07-11 DIAGNOSIS — F17200 Nicotine dependence, unspecified, uncomplicated: Secondary | ICD-10-CM | POA: Insufficient documentation

## 2011-07-11 DIAGNOSIS — R739 Hyperglycemia, unspecified: Secondary | ICD-10-CM

## 2011-07-11 DIAGNOSIS — R609 Edema, unspecified: Secondary | ICD-10-CM | POA: Insufficient documentation

## 2011-07-11 DIAGNOSIS — Z86718 Personal history of other venous thrombosis and embolism: Secondary | ICD-10-CM | POA: Insufficient documentation

## 2011-07-11 DIAGNOSIS — E785 Hyperlipidemia, unspecified: Secondary | ICD-10-CM | POA: Insufficient documentation

## 2011-07-11 LAB — GLUCOSE, CAPILLARY: Glucose-Capillary: 303 mg/dL — ABNORMAL HIGH (ref 70–99)

## 2011-07-11 LAB — POCT I-STAT, CHEM 8
Chloride: 102 mEq/L (ref 96–112)
HCT: 48 % (ref 39.0–52.0)
Hemoglobin: 16.3 g/dL (ref 13.0–17.0)
Potassium: 4.1 mEq/L (ref 3.5–5.1)
Sodium: 140 mEq/L (ref 135–145)

## 2011-07-11 LAB — CBC
Platelets: 240 10*3/uL (ref 150–400)
RBC: 4.64 MIL/uL (ref 4.22–5.81)
RDW: 14 % (ref 11.5–15.5)
WBC: 5.9 10*3/uL (ref 4.0–10.5)

## 2011-07-11 LAB — LIPASE, BLOOD: Lipase: 16 U/L (ref 11–59)

## 2011-07-11 MED ORDER — HYDROMORPHONE HCL PF 1 MG/ML IJ SOLN
1.0000 mg | Freq: Once | INTRAMUSCULAR | Status: AC
  Administered 2011-07-11: 1 mg via INTRAVENOUS
  Filled 2011-07-11: qty 1

## 2011-07-11 MED ORDER — ONDANSETRON 4 MG PO TBDP
8.0000 mg | ORAL_TABLET | Freq: Once | ORAL | Status: AC
  Administered 2011-07-11: 8 mg via ORAL
  Filled 2011-07-11: qty 2

## 2011-07-11 MED ORDER — SODIUM CHLORIDE 0.9 % IV BOLUS (SEPSIS)
1000.0000 mL | Freq: Once | INTRAVENOUS | Status: AC
  Administered 2011-07-11: 1000 mL via INTRAVENOUS

## 2011-07-11 MED ORDER — OXYCODONE HCL 5 MG PO TABS
10.0000 mg | ORAL_TABLET | Freq: Once | ORAL | Status: AC
  Administered 2011-07-12: 10 mg via ORAL
  Filled 2011-07-11: qty 2

## 2011-07-11 MED ORDER — METFORMIN HCL 500 MG PO TABS
1000.0000 mg | ORAL_TABLET | ORAL | Status: AC
  Administered 2011-07-12: 1000 mg via ORAL
  Filled 2011-07-11: qty 2

## 2011-07-11 NOTE — ED Notes (Signed)
Pt requesting to know how many mg. Of dilaudid he was getting. When I told him he st's "that's just not gonna get it"   Dilaudid 1mg  slow IV push given with NS infusing.  Pt st's "you need to flush that in"

## 2011-07-11 NOTE — ED Notes (Signed)
Pt reports several day hx of nausea, vomiting, and generalized abdominal pain. Pt states he has not been able to keep any of his medications down. Reports swelling to his lower extremities.

## 2011-07-11 NOTE — ED Provider Notes (Signed)
History     CSN: [id number]  Arrival date & time 07/11/11  1700   First MD Initiated Contact with Patient 07/11/11 2102      Chief Complaint  Patient presents with  . Abdominal Pain  . Emesis  . Fatigue    (Consider location/radiation/quality/duration/timing/severity/associated sxs/prior treatment) HPI Comments: Patient with chronic abdominal pain, history also narcotic abuse, history neck comes in stating, that he's had 2 days of nausea and vomiting, unable to tolerate any of his by mouth medicines.  His blood sugar, which normally is.  The 200s.  He states there was 200 despite the fact that he has not eaten or been able to tolerate any of his medications for 2 days.  Is requesting pain medication for his abdominal pain  Patient is a 61 y.o. male presenting with abdominal pain and vomiting. The history is provided by the patient.  Abdominal Pain The primary symptoms of the illness include abdominal pain, fatigue, nausea and vomiting. The primary symptoms of the illness do not include fever, diarrhea or dysuria. The current episode started 2 days ago. The onset of the illness was gradual. The problem has not changed since onset. Additional symptoms associated with the illness include back pain.  Emesis  Associated symptoms include abdominal pain. Pertinent negatives include no diarrhea, no fever and no headaches.    Past Medical History  Diagnosis Date  . Hypertension   . Diabetes mellitus   . DVT (deep venous thrombosis)   . Chronic back pain   . 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 Hyperlipidemia   . CAD (coronary atherosclerotic disease)     s/p stents x2  . Small bowel obstruction     Past Surgical History  Procedure Date  . Heart stent   . Tonsillectomy and adenoidectomy   . Cholecystectomy     Family History  Problem Relation Age of Onset  . Diabetes Father     History  Substance Use Topics  . Smoking status: Current Some Day  Smoker -- 0.2 packs/day for 30 years    Types: Cigarettes  . Smokeless tobacco: Never Used  . Alcohol Use: No      Review of Systems  Constitutional: Positive for fatigue. Negative for fever.  Gastrointestinal: Positive for nausea, vomiting and abdominal pain. Negative for diarrhea.  Genitourinary: Negative for dysuria.  Musculoskeletal: Positive for back pain.  Neurological: Negative for dizziness and headaches.    Allergies  Lisinopril and Morphine and related  Home Medications   Current Outpatient Rx  Name Route Sig Dispense Refill  . ASPIRIN EC 81 MG PO TBEC Oral Take 81 mg by mouth daily.    Marland Kitchen GABAPENTIN 400 MG PO CAPS Oral Take 400 mg by mouth 3 (three) times daily.      Marland Kitchen GLIPIZIDE 10 MG PO TABS Oral Take 10 mg by mouth 2 (two) times daily before a meal.      . LOSARTAN POTASSIUM 100 MG PO TABS Oral Take 100 mg by mouth daily.    Marland Kitchen METFORMIN HCL 1000 MG PO TABS Oral Take 1,000 mg by mouth 2 (two) times daily with a meal.      . ONDANSETRON HCL 4 MG PO TABS Oral Take 1 tablet (4 mg total) by mouth every 6 (six) hours. 12 tablet 0  . OXYCODONE HCL 30 MG PO TABS Oral Take 30 mg by mouth every 4 (four) hours as needed. For pain    . RANITIDINE  HCL 300 MG PO CAPS Oral Take 300 mg by mouth 2 (two) times daily.        BP 141/72  Pulse 84  Temp(Src) 97.2 F (36.2 C) (Oral)  Resp 18  SpO2 97%  Physical Exam  Constitutional: He is oriented to person, place, and time. He appears well-developed and well-nourished.  HENT:  Head: Normocephalic.  Mouth/Throat: Mucous membranes are dry.  Eyes: Pupils are equal, round, and reactive to light.  Neck: Normal range of motion.  Cardiovascular: Normal rate.   Pulmonary/Chest: Effort normal.  Abdominal: Soft. He exhibits no distension. There is no tenderness.       Describes a bandlike in the upper to mid abdomen discomfort  Musculoskeletal: Normal range of motion. He exhibits edema.  Neurological: He is alert and oriented to  person, place, and time.  Skin: Skin is warm and dry. There is pallor.    ED Course  Procedures (including critical care time)  Labs Reviewed  POCT I-STAT, CHEM 8 - Abnormal; Notable for the following:    BUN 5 (*)    Glucose, Bld 322 (*)    All other components within normal limits  GLUCOSE, CAPILLARY - Abnormal; Notable for the following:    Glucose-Capillary 303 (*)    All other components within normal limits  GLUCOSE, CAPILLARY - Abnormal; Notable for the following:    Glucose-Capillary 235 (*)    All other components within normal limits  CBC  LIPASE, BLOOD   Dg Abd Acute W/chest  07/11/2011  *RADIOLOGY REPORT*  Clinical Data: Abdominal pain, emesis.  ACUTE ABDOMEN SERIES (ABDOMEN 2 VIEW & CHEST 1 VIEW)  Comparison: 06/15/2011 radiograph and CT  Findings: Heart size within normal limits.  Central vascular fullness.  Mild bibasilar airspace opacities, similar to prior.  No pleural effusion.  No pneumothorax.  Air present within loops of large and small bowel.  No free intraperitoneal air.  Organ outlines normal where seen.  Surgical clips right upper quadrant.  L4-5 posterior fusion.  No acute osseous abnormality identified.  IMPRESSION: No evidence for obstruction.  Bibasilar opacities are similar to prior; atelectasis, infection, or edema.  Original Report Authenticated By: Waneta Martins, M.D.     1. Hyperglycemia   2. Chronic abdominal pain       MDM  Will evaluate his acute on chronic abdominal pain.  Will treat nausea and vomiting with IV fluids and antiemetics will evaluate his electrolyte status        Arman Filter, NP 07/12/11 0559  Arman Filter, NP 07/12/11 [phone]

## 2011-07-11 NOTE — ED Notes (Signed)
Pt remains in waiting room.  No distress noted.  Updated on POC. 

## 2011-07-11 NOTE — ED Notes (Signed)
CBG was 303. Notified Nurse Selena Batten.

## 2011-07-11 NOTE — ED Notes (Signed)
Pt continues to c/o pain, requesting more pain med

## 2011-07-12 ENCOUNTER — Emergency Department (HOSPITAL_COMMUNITY)
Admission: EM | Admit: 2011-07-12 | Discharge: 2011-07-13 | Disposition: A | Discharge status: Home / Self Care | Payer: Medicare Other | Attending: Emergency Medicine | Admitting: Emergency Medicine

## 2011-07-12 ENCOUNTER — Encounter (HOSPITAL_COMMUNITY): Payer: Self-pay | Admitting: *Deleted

## 2011-07-12 DIAGNOSIS — Z7982 Long term (current) use of aspirin: Secondary | ICD-10-CM | POA: Insufficient documentation

## 2011-07-12 DIAGNOSIS — I10 Essential (primary) hypertension: Secondary | ICD-10-CM | POA: Insufficient documentation

## 2011-07-12 DIAGNOSIS — I2510 Atherosclerotic heart disease of native coronary artery without angina pectoris: Secondary | ICD-10-CM | POA: Insufficient documentation

## 2011-07-12 DIAGNOSIS — Z79899 Other long term (current) drug therapy: Secondary | ICD-10-CM | POA: Insufficient documentation

## 2011-07-12 DIAGNOSIS — Z86718 Personal history of other venous thrombosis and embolism: Secondary | ICD-10-CM | POA: Insufficient documentation

## 2011-07-12 DIAGNOSIS — R1033 Periumbilical pain: Secondary | ICD-10-CM | POA: Insufficient documentation

## 2011-07-12 DIAGNOSIS — E785 Hyperlipidemia, unspecified: Secondary | ICD-10-CM | POA: Insufficient documentation

## 2011-07-12 DIAGNOSIS — R112 Nausea with vomiting, unspecified: Secondary | ICD-10-CM | POA: Insufficient documentation

## 2011-07-12 DIAGNOSIS — G8929 Other chronic pain: Secondary | ICD-10-CM | POA: Insufficient documentation

## 2011-07-12 DIAGNOSIS — E119 Type 2 diabetes mellitus without complications: Secondary | ICD-10-CM | POA: Insufficient documentation

## 2011-07-12 DIAGNOSIS — M549 Dorsalgia, unspecified: Secondary | ICD-10-CM | POA: Insufficient documentation

## 2011-07-12 LAB — DIFFERENTIAL
Basophils Absolute: 0 10*3/uL (ref 0.0–0.1)
Basophils Relative: 1 % (ref 0–1)
Eosinophils Absolute: 0.1 10*3/uL (ref 0.0–0.7)
Eosinophils Relative: 2 % (ref 0–5)
Lymphocytes Relative: 29 % (ref 12–46)
Lymphs Abs: 1.8 10*3/uL (ref 0.7–4.0)
Monocytes Absolute: 0.4 10*3/uL (ref 0.1–1.0)
Monocytes Relative: 7 % (ref 3–12)
Neutro Abs: 3.9 10*3/uL (ref 1.7–7.7)
Neutrophils Relative %: 62 % (ref 43–77)

## 2011-07-12 LAB — CBC
HCT: 44.1 % (ref 39.0–52.0)
Hemoglobin: 14.6 g/dL (ref 13.0–17.0)
MCH: 31.5 pg (ref 26.0–34.0)
MCHC: 33.1 g/dL (ref 30.0–36.0)
MCV: 95 fL (ref 78.0–100.0)
Platelets: 257 10*3/uL (ref 150–400)
RBC: 4.64 MIL/uL (ref 4.22–5.81)
RDW: 14 % (ref 11.5–15.5)
WBC: 6.2 10*3/uL (ref 4.0–10.5)

## 2011-07-12 LAB — BASIC METABOLIC PANEL
BUN: 5 mg/dL — ABNORMAL LOW (ref 6–23)
CO2: 24 mEq/L (ref 19–32)
Calcium: 9.6 mg/dL (ref 8.4–10.5)
Chloride: 103 mEq/L (ref 96–112)
Creatinine, Ser: 0.6 mg/dL (ref 0.50–1.35)
GFR calc Af Amer: >90 mL/min (ref >90)
GFR calc non Af Amer: >90 mL/min (ref >90)
Glucose, Bld: 249 mg/dL — ABNORMAL HIGH (ref 70–99)
Potassium: 3.7 mEq/L (ref 3.5–5.1)
Sodium: 138 mEq/L (ref 135–145)

## 2011-07-12 LAB — GLUCOSE, CAPILLARY: Glucose-Capillary: 235 mg/dL — ABNORMAL HIGH (ref 70–99)

## 2011-07-12 MED ORDER — ONDANSETRON HCL 4 MG PO TABS: 4.0000 mg | ORAL_TABLET | Freq: Four times a day (QID) | ORAL | Status: DC

## 2011-07-12 MED ORDER — HYDROMORPHONE HCL PF 1 MG/ML IJ SOLN
1.0000 mg | Freq: Once | INTRAMUSCULAR | Status: AC
  Administered 2011-07-12: 1 mg via INTRAVENOUS
  Filled 2011-07-12: qty 1

## 2011-07-12 MED ORDER — ONDANSETRON HCL 4 MG/2ML IJ SOLN
4.0000 mg | Freq: Once | INTRAMUSCULAR | Status: AC
  Administered 2011-07-12: 4 mg via INTRAVENOUS
  Filled 2011-07-12: qty 2

## 2011-07-12 MED ORDER — LORAZEPAM 2 MG/ML IJ SOLN
1.0000 mg | Freq: Once | INTRAMUSCULAR | Status: AC
  Administered 2011-07-12: 1 mg via INTRAVENOUS
  Filled 2011-07-12: qty 1

## 2011-07-12 MED ORDER — SODIUM CHLORIDE 0.9 % IV SOLN
INTRAVENOUS | Status: DC
  Administered 2011-07-12: 22:00:00 via INTRAVENOUS

## 2011-07-12 MED ORDER — PROMETHAZINE HCL 25 MG/ML IJ SOLN
12.5000 mg | INTRAMUSCULAR | Status: AC
  Administered 2011-07-12: 12.5 mg via INTRAVENOUS
  Filled 2011-07-12: qty 1

## 2011-07-12 MED ORDER — IOHEXOL 300 MG/ML  SOLN
20.0000 mL | INTRAMUSCULAR | Status: AC
  Administered 2011-07-12 – 2011-07-13 (×2): 20 mL via ORAL

## 2011-07-12 MED ORDER — SODIUM CHLORIDE 0.9 % IV BOLUS (SEPSIS)
500.0000 mL | Freq: Once | INTRAVENOUS | Status: AC
  Administered 2011-07-12: 500 mL via INTRAVENOUS

## 2011-07-12 NOTE — ED Notes (Signed)
Called patient in waiting area to re-check vital signs; no answer.  Will call again.

## 2011-07-12 NOTE — ED Provider Notes (Signed)
Medical screening examination/treatment/procedure(s) were performed by non-physician practitioner and as supervising physician I was immediately available for consultation/collaboration.   Dayton Bailiff, MD 07/12/11 1001

## 2011-07-12 NOTE — ED Provider Notes (Signed)
History     CSN: [id number]  Arrival date & time 07/12/11  1701   First MD Initiated Contact with Patient 07/12/11 2010      Chief Complaint  Patient presents with  . Emesis    (Consider location/radiation/quality/duration/timing/severity/associated sxs/prior treatment) Patient is a 61 y.o. male presenting with abdominal pain. The history is provided by the patient.  Abdominal Pain The primary symptoms of the illness include abdominal pain, nausea and vomiting. The primary symptoms of the illness do not include fever, shortness of breath or diarrhea. The current episode started more than 2 days ago. The onset of the illness was gradual. The problem has been gradually worsening.  The abdominal pain is located in the periumbilical region. The severity of the abdominal pain is 10/10. The abdominal pain is relieved by nothing.  Symptoms associated with the illness do not include chills. Associated symptoms comments: He has complaint of abdominal pain with numerous episodes of vomiting for the past 4 days. He reports a history of pancreatitis, chronic back pain, cholecystectomy and recent admission for SBO in January of this year. No fever, SOB, chest pain, bloody bowel movements. . Significant associated medical issues include diabetes.    Past Medical History  Diagnosis Date  . Hypertension   . Diabetes mellitus   . DVT (deep venous thrombosis)   . Chronic back pain   . 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 Hyperlipidemia   . CAD (coronary atherosclerotic disease)     s/p stents x2  . Small bowel obstruction     Past Surgical History  Procedure Date  . Heart stent   . Tonsillectomy and adenoidectomy   . Cholecystectomy     Family History  Problem Relation Age of Onset  . Diabetes Father     History  Substance Use Topics  . Smoking status: Current Some Day Smoker -- 0.2 packs/day for 30 years    Types: Cigarettes  . Smokeless tobacco: Never Used   . Alcohol Use: No      Review of Systems  Constitutional: Negative for fever and chills.  HENT: Negative.   Respiratory: Negative.  Negative for shortness of breath.   Cardiovascular: Negative.  Negative for chest pain.  Gastrointestinal: Positive for nausea, vomiting and abdominal pain. Negative for diarrhea.  Musculoskeletal: Negative.   Skin: Negative.   Neurological: Negative.     Allergies  Lisinopril and Morphine and related  Home Medications   Current Outpatient Rx  Name Route Sig Dispense Refill  . ASPIRIN EC 81 MG PO TBEC Oral Take 81 mg by mouth daily.    Marland Kitchen GABAPENTIN 400 MG PO CAPS Oral Take 400 mg by mouth 3 (three) times daily.     Marland Kitchen GLIPIZIDE 10 MG PO TABS Oral Take 10 mg by mouth 2 (two) times daily before a meal.      . LOSARTAN POTASSIUM 100 MG PO TABS Oral Take 100 mg by mouth daily.    Marland Kitchen METFORMIN HCL 1000 MG PO TABS Oral Take 1,000 mg by mouth 2 (two) times daily with a meal.      . ONDANSETRON HCL 4 MG PO TABS Oral Take 4 mg by mouth every 6 (six) hours as needed. For nausea    . OXYCODONE HCL 30 MG PO TABS Oral Take 30 mg by mouth every 4 (four) hours as needed. For pain    . RANITIDINE HCL 300 MG PO CAPS Oral Take 300 mg by mouth  2 (two) times daily.        BP 140/65  Pulse 80  Temp(Src) 99.1 F (37.3 C) (Oral)  Resp 22  SpO2 95%  Physical Exam  Constitutional: He appears well-developed and well-nourished.  HENT:  Head: Normocephalic.  Neck: Normal range of motion. Neck supple.  Cardiovascular: Normal rate and regular rhythm.   Pulmonary/Chest: Effort normal and breath sounds normal.  Abdominal: Soft. Bowel sounds are normal. He exhibits no mass. There is tenderness in the periumbilical area. There is no rebound and no guarding.    Musculoskeletal: Normal range of motion.  Neurological: He is alert. No cranial nerve deficit.  Skin: Skin is warm and dry. No rash noted.  Psychiatric: He has a normal mood and affect.    ED Course    Procedures (including critical care time) Results for orders placed during the hospital encounter of 07/12/11  URINALYSIS, ROUTINE W REFLEX MICROSCOPIC      Component Value Range   Color, Urine YELLOW  YELLOW    APPearance CLEAR  CLEAR    Specific Gravity, Urine 1.009  1.005 - 1.030    pH 7.0  5.0 - 8.0    Glucose, UA NEGATIVE  NEGATIVE (mg/dL)   Hgb urine dipstick NEGATIVE  NEGATIVE    Bilirubin Urine NEGATIVE  NEGATIVE    Ketones, ur NEGATIVE  NEGATIVE (mg/dL)   Protein, ur NEGATIVE  NEGATIVE (mg/dL)   Urobilinogen, UA 0.2  0.0 - 1.0 (mg/dL)   Nitrite NEGATIVE  NEGATIVE    Leukocytes, UA TRACE (*) NEGATIVE   CBC      Component Value Range   WBC 6.2  4.0 - 10.5 (K/uL)   RBC 4.64  4.22 - 5.81 (MIL/uL)   Hemoglobin 14.6  13.0 - 17.0 (g/dL)   HCT 45.4  09.8 - 11.9 (%)   MCV 95.0  78.0 - 100.0 (fL)   MCH 31.5  26.0 - 34.0 (pg)   MCHC 33.1  30.0 - 36.0 (g/dL)   RDW 14.7  82.9 - 56.2 (%)   Platelets 257  150 - 400 (K/uL)  DIFFERENTIAL      Component Value Range   Neutrophils Relative 62  43 - 77 (%)   Neutro Abs 3.9  1.7 - 7.7 (K/uL)   Lymphocytes Relative 29  12 - 46 (%)   Lymphs Abs 1.8  0.7 - 4.0 (K/uL)   Monocytes Relative 7  3 - 12 (%)   Monocytes Absolute 0.4  0.1 - 1.0 (K/uL)   Eosinophils Relative 2  0 - 5 (%)   Eosinophils Absolute 0.1  0.0 - 0.7 (K/uL)   Basophils Relative 1  0 - 1 (%)   Basophils Absolute 0.0  0.0 - 0.1 (K/uL)  BASIC METABOLIC PANEL      Component Value Range   Sodium 138  135 - 145 (mEq/L)   Potassium 3.7  3.5 - 5.1 (mEq/L)   Chloride 103  96 - 112 (mEq/L)   CO2 24  19 - 32 (mEq/L)   Glucose, Bld 249 (*) 70 - 99 (mg/dL)   BUN 5 (*) 6 - 23 (mg/dL)   Creatinine, Ser 1.30  0.50 - 1.35 (mg/dL)   Calcium 9.6  8.4 - 86.5 (mg/dL)   GFR calc non Af Amer >90  >90 (mL/min)   GFR calc Af Amer >90  >90 (mL/min)  URINE MICROSCOPIC-ADD ON      Component Value Range   Squamous Epithelial / LPF RARE  RARE    WBC,  UA 3-6  <3 (WBC/hpf)   RBC / HPF 0-2   <3 (RBC/hpf)   Bacteria, UA RARE  RARE    Dg Abd Acute W/chest  07/11/2011  *RADIOLOGY REPORT*  Clinical Data: Abdominal pain, emesis.  ACUTE ABDOMEN SERIES (ABDOMEN 2 VIEW & CHEST 1 VIEW)  Comparison: 06/15/2011 radiograph and CT  Findings: Heart size within normal limits.  Central vascular fullness.  Mild bibasilar airspace opacities, similar to prior.  No pleural effusion.  No pneumothorax.  Air present within loops of large and small bowel.  No free intraperitoneal air.  Organ outlines normal where seen.  Surgical clips right upper quadrant.  L4-5 posterior fusion.  No acute osseous abnormality identified.  IMPRESSION: No evidence for obstruction.  Bibasilar opacities are similar to prior; atelectasis, infection, or edema.  Original Report Authenticated By: Waneta Martins, M.D.     No diagnosis found.    MDM  Old charts reviewed. The patient has recurrent abdominal pain with documented history of SBO by CT scan, who presents with same presentation as with multiple previous presentations. He is repeatedly requesting narcotic pain medication. Review of chart also reveals that he has demonstrated narcotic seeking behavior with multiple normal and a Controlled Substance Report showing, most recently, over 400 oxycodone and oxycontin pills given within a 7 day period by multiple providers. Discussed with patient and plan of care will be consideration for complaint of pain, cautious use of narcotic pain relievers, and a thorough evaluation, including CT scan to evaluate for recurrent SBO.         Rodena Medin, PA-C 07/13/11 [phone]

## 2011-07-12 NOTE — ED Notes (Signed)
The pt was here last pm for abd pain and vomiting .  He says he has not been able to keep his meds down and he feels weak

## 2011-07-12 NOTE — ED Provider Notes (Signed)
Patient reports persistent nausea vomiting and diffuse abdominal pain. Onset of symptoms 4 days ago. Was seen here last night for same symptoms and sent home with Zofran. States the Zofran is not working he has continued to have vomiting and, and states he is unable to keep down even sips of water. Also states has been unable to keep down his medications. States diffuse abdominal pain is "3 times worse than last night". Patient appears clinically dehydrated. BBS diminished but otherwise CTA, abdomen is soft with normal bowel sounds, diffuse abdominal TTP worse to left mid quadrant and periumbilical region. Patient is s/p cholecystectomy and admits to history of pancreatitis. Will obtain IV access, medicate for pain and nausea,  obtain appropriate labs and move to exam room for further evaluation.  Leanne Chang, NP 07/12/11 2040

## 2011-07-12 NOTE — ED Notes (Signed)
Pt was able to keep po fluids down

## 2011-07-13 ENCOUNTER — Emergency Department (HOSPITAL_COMMUNITY): Payer: Medicare Other

## 2011-07-13 LAB — URINE MICROSCOPIC-ADD ON

## 2011-07-13 LAB — URINALYSIS, ROUTINE W REFLEX MICROSCOPIC
Bilirubin Urine: NEGATIVE
Glucose, UA: NEGATIVE mg/dL
Hgb urine dipstick: NEGATIVE
Ketones, ur: NEGATIVE mg/dL
Nitrite: NEGATIVE
Protein, ur: NEGATIVE mg/dL
Specific Gravity, Urine: 1.009 (ref 1.005–1.030)
Urobilinogen, UA: 0.2 mg/dL (ref 0.0–1.0)
pH: 7 (ref 5.0–8.0)

## 2011-07-13 MED ORDER — IOHEXOL 300 MG/ML  SOLN
100.0000 mL | Freq: Once | INTRAMUSCULAR | Status: AC | PRN
  Administered 2011-07-13: 100 mL via INTRAVENOUS

## 2011-07-13 MED ORDER — HYDROMORPHONE HCL PF 1 MG/ML IJ SOLN
1.0000 mg | Freq: Once | INTRAMUSCULAR | Status: AC
  Administered 2011-07-13: 1 mg via INTRAVENOUS
  Filled 2011-07-13: qty 1

## 2011-07-13 NOTE — Discharge Instructions (Signed)

## 2011-07-13 NOTE — ED Provider Notes (Signed)
Medical screening examination/treatment/procedure(s) were performed by non-physician practitioner and as supervising physician I was immediately available for consultation/collaboration.    Nelia Shi, MD 07/13/11 [phone]

## 2011-07-13 NOTE — ED Notes (Signed)
Patient is AOx4 and comfortable with his discharge instructions.  He is being driven home by a family member.

## 2011-07-13 NOTE — ED Provider Notes (Signed)
Pt seen/examined abd soft No distress noted CT abd/pelvis shows no abnormality He denies cp/sob Stable for d/c BP 150/70  Pulse 67  Temp(Src) 99.1 F (37.3 C) (Oral)  Resp 16  SpO2 98%   Joya Gaskins, MD 07/13/11 [phone]

## 2011-07-13 NOTE — ED Provider Notes (Signed)
Medical screening examination/treatment/procedure(s) were performed by non-physician practitioner and as supervising physician I was immediately available for consultation/collaboration.    Nelia Shi, MD 07/13/11 1002

## 2011-07-17 ENCOUNTER — Emergency Department (HOSPITAL_COMMUNITY): Payer: Medicare Other

## 2011-07-17 ENCOUNTER — Other Ambulatory Visit: Payer: Self-pay

## 2011-07-17 ENCOUNTER — Inpatient Hospital Stay (HOSPITAL_COMMUNITY)
Admission: EM | Admit: 2011-07-17 | Discharge: 2011-07-21 | DRG: 440 | Disposition: A | Discharge status: Home / Self Care | Payer: Medicare Other | Source: Ambulatory Visit | Attending: Family Medicine | Admitting: Family Medicine

## 2011-07-17 ENCOUNTER — Encounter (HOSPITAL_COMMUNITY): Payer: Self-pay | Admitting: Emergency Medicine

## 2011-07-17 DIAGNOSIS — K861 Other chronic pancreatitis: Secondary | ICD-10-CM

## 2011-07-17 DIAGNOSIS — K5900 Constipation, unspecified: Secondary | ICD-10-CM | POA: Diagnosis present

## 2011-07-17 DIAGNOSIS — E860 Dehydration: Secondary | ICD-10-CM

## 2011-07-17 DIAGNOSIS — K859 Acute pancreatitis without necrosis or infection, unspecified: Secondary | ICD-10-CM

## 2011-07-17 DIAGNOSIS — I2510 Atherosclerotic heart disease of native coronary artery without angina pectoris: Secondary | ICD-10-CM | POA: Diagnosis present

## 2011-07-17 DIAGNOSIS — I10 Essential (primary) hypertension: Secondary | ICD-10-CM

## 2011-07-17 DIAGNOSIS — E119 Type 2 diabetes mellitus without complications: Secondary | ICD-10-CM

## 2011-07-17 DIAGNOSIS — E876 Hypokalemia: Secondary | ICD-10-CM

## 2011-07-17 DIAGNOSIS — G8929 Other chronic pain: Secondary | ICD-10-CM | POA: Diagnosis present

## 2011-07-17 DIAGNOSIS — M545 Low back pain, unspecified: Secondary | ICD-10-CM

## 2011-07-17 DIAGNOSIS — K219 Gastro-esophageal reflux disease without esophagitis: Secondary | ICD-10-CM | POA: Diagnosis present

## 2011-07-17 DIAGNOSIS — Z6837 Body mass index (BMI) 37.0-37.9, adult: Secondary | ICD-10-CM

## 2011-07-17 DIAGNOSIS — F17200 Nicotine dependence, unspecified, uncomplicated: Secondary | ICD-10-CM | POA: Diagnosis present

## 2011-07-17 LAB — CBC
MCH: 31.1 pg (ref 26.0–34.0)
MCH: 31.5 pg (ref 26.0–34.0)
MCHC: 33.7 g/dL (ref 30.0–36.0)
MCV: 93.5 fL (ref 78.0–100.0)
Platelets: 258 10*3/uL (ref 150–400)
Platelets: 228 10*3/uL (ref 150–400)
RBC: 5.28 MIL/uL (ref 4.22–5.81)
RDW: 13.7 % (ref 11.5–15.5)

## 2011-07-17 LAB — GLUCOSE, CAPILLARY: Glucose-Capillary: 187 mg/dL — ABNORMAL HIGH (ref 70–99)

## 2011-07-17 LAB — LIPID PANEL
HDL: 46 mg/dL (ref >39)
LDL Cholesterol: 84 mg/dL (ref 0–99)
Triglycerides: 82 mg/dL (ref <150)
VLDL: 16 mg/dL (ref 0–40)

## 2011-07-17 LAB — COMPREHENSIVE METABOLIC PANEL
ALT: 11 U/L (ref 0–53)
AST: 14 U/L (ref 0–37)
AST: 12 U/L (ref 0–37)
Albumin: 3.4 g/dL — ABNORMAL LOW (ref 3.5–5.2)
Albumin: 3.6 g/dL (ref 3.5–5.2)
CO2: 23 mEq/L (ref 19–32)
Calcium: 9.4 mg/dL (ref 8.4–10.5)
Calcium: 8.9 mg/dL (ref 8.4–10.5)
Chloride: 101 mEq/L (ref 96–112)
Creatinine, Ser: 0.75 mg/dL (ref 0.50–1.35)
Sodium: 134 mEq/L — ABNORMAL LOW (ref 135–145)
Total Protein: 7.2 g/dL (ref 6.0–8.3)
Total Protein: 7.2 g/dL (ref 6.0–8.3)

## 2011-07-17 LAB — URINE MICROSCOPIC-ADD ON

## 2011-07-17 LAB — URINALYSIS, ROUTINE W REFLEX MICROSCOPIC
Bilirubin Urine: NEGATIVE
Nitrite: NEGATIVE
Protein, ur: 30 mg/dL — AB
Specific Gravity, Urine: 1.034 — ABNORMAL HIGH (ref 1.005–1.030)
Urobilinogen, UA: 1 mg/dL (ref 0.0–1.0)

## 2011-07-17 LAB — LIPASE, BLOOD: Lipase: 139 U/L — ABNORMAL HIGH (ref 11–59)

## 2011-07-17 MED ORDER — HYDROMORPHONE HCL PF 1 MG/ML IJ SOLN
1.0000 mg | Freq: Once | INTRAMUSCULAR | Status: AC
  Administered 2011-07-17: 1 mg via INTRAVENOUS
  Filled 2011-07-17: qty 1

## 2011-07-17 MED ORDER — INSULIN ASPART 100 UNIT/ML ~~LOC~~ SOLN
0.0000 [IU] | SUBCUTANEOUS | Status: DC
  Administered 2011-07-17: 5 [IU] via SUBCUTANEOUS
  Administered 2011-07-18 (×4): 2 [IU] via SUBCUTANEOUS
  Administered 2011-07-18: 3 [IU] via SUBCUTANEOUS
  Administered 2011-07-19 (×2): 2 [IU] via SUBCUTANEOUS
  Administered 2011-07-19 – 2011-07-20 (×2): 3 [IU] via SUBCUTANEOUS
  Administered 2011-07-20: 5 [IU] via SUBCUTANEOUS
  Administered 2011-07-20: 3 [IU] via SUBCUTANEOUS
  Administered 2011-07-20: 5 [IU] via SUBCUTANEOUS
  Administered 2011-07-21 (×2): 3 [IU] via SUBCUTANEOUS
  Administered 2011-07-21: 5 [IU] via SUBCUTANEOUS
  Administered 2011-07-21: 8 [IU] via SUBCUTANEOUS
  Administered 2011-07-21: 3 [IU] via SUBCUTANEOUS
  Filled 2011-07-17: qty 1

## 2011-07-17 MED ORDER — ALPRAZOLAM 0.5 MG PO TABS
1.0000 mg | ORAL_TABLET | Freq: Two times a day (BID) | ORAL | Status: DC
  Administered 2011-07-17 – 2011-07-21 (×8): 1 mg via ORAL
  Filled 2011-07-17: qty 1
  Filled 2011-07-17 (×5): qty 2
  Filled 2011-07-17 (×3): qty 1
  Filled 2011-07-17: qty 2

## 2011-07-17 MED ORDER — HEPARIN SODIUM (PORCINE) 5000 UNIT/ML IJ SOLN
5000.0000 [IU] | Freq: Three times a day (TID) | INTRAMUSCULAR | Status: DC
  Administered 2011-07-17 – 2011-07-21 (×12): 5000 [IU] via SUBCUTANEOUS
  Filled 2011-07-17 (×14): qty 1

## 2011-07-17 MED ORDER — FAMOTIDINE 20 MG PO TABS
20.0000 mg | ORAL_TABLET | Freq: Two times a day (BID) | ORAL | Status: DC
  Administered 2011-07-17 – 2011-07-21 (×8): 20 mg via ORAL
  Filled 2011-07-17 (×9): qty 1

## 2011-07-17 MED ORDER — SODIUM CHLORIDE 0.9 % IV SOLN
INTRAVENOUS | Status: DC
  Administered 2011-07-17 – 2011-07-20 (×10): via INTRAVENOUS

## 2011-07-17 MED ORDER — SODIUM CHLORIDE 0.9 % IV BOLUS (SEPSIS)
500.0000 mL | Freq: Once | INTRAVENOUS | Status: AC
  Administered 2011-07-17: 500 mL via INTRAVENOUS

## 2011-07-17 MED ORDER — ONDANSETRON HCL 4 MG PO TABS: 4.0000 mg | ORAL_TABLET | Freq: Four times a day (QID) | ORAL | Status: DC | PRN

## 2011-07-17 MED ORDER — POTASSIUM CHLORIDE 10 MEQ/100ML IV SOLN
10.0000 meq | Freq: Once | INTRAVENOUS | Status: DC
  Filled 2011-07-17: qty 100

## 2011-07-17 MED ORDER — POTASSIUM CHLORIDE CRYS ER 20 MEQ PO TBCR
40.0000 meq | EXTENDED_RELEASE_TABLET | Freq: Once | ORAL | Status: AC
  Administered 2011-07-17: 40 meq via ORAL
  Filled 2011-07-17: qty 2

## 2011-07-17 MED ORDER — ONDANSETRON HCL 4 MG/2ML IJ SOLN
4.0000 mg | Freq: Four times a day (QID) | INTRAMUSCULAR | Status: DC | PRN
  Administered 2011-07-18 – 2011-07-20 (×3): 4 mg via INTRAVENOUS
  Filled 2011-07-17 (×3): qty 2

## 2011-07-17 MED ORDER — CHLORHEXIDINE GLUCONATE 0.12 % MT SOLN
15.0000 mL | Freq: Two times a day (BID) | OROMUCOSAL | Status: DC
  Administered 2011-07-18 – 2011-07-21 (×8): 15 mL via OROMUCOSAL
  Filled 2011-07-17 (×10): qty 15

## 2011-07-17 MED ORDER — LOSARTAN POTASSIUM 50 MG PO TABS: 100.0000 mg | ORAL_TABLET | Freq: Every day | ORAL | Status: DC

## 2011-07-17 MED ORDER — GABAPENTIN 400 MG PO CAPS
400.0000 mg | ORAL_CAPSULE | Freq: Three times a day (TID) | ORAL | Status: DC
  Administered 2011-07-17 – 2011-07-21 (×12): 400 mg via ORAL
  Filled 2011-07-17 (×13): qty 1

## 2011-07-17 MED ORDER — BIOTENE DRY MOUTH MT LIQD
15.0000 mL | Freq: Two times a day (BID) | OROMUCOSAL | Status: DC
  Administered 2011-07-18 – 2011-07-21 (×8): 15 mL via OROMUCOSAL

## 2011-07-17 MED ORDER — METOCLOPRAMIDE HCL 5 MG/ML IJ SOLN
10.0000 mg | Freq: Once | INTRAMUSCULAR | Status: AC
  Administered 2011-07-17: 10 mg via INTRAVENOUS
  Filled 2011-07-17: qty 2

## 2011-07-17 MED ORDER — WHITE PETROLATUM GEL
Status: AC
  Administered 2011-07-17: 0.2
  Filled 2011-07-17: qty 5

## 2011-07-17 MED ORDER — LOSARTAN POTASSIUM 50 MG PO TABS
100.0000 mg | ORAL_TABLET | Freq: Every day | ORAL | Status: DC
  Administered 2011-07-17 – 2011-07-21 (×5): 100 mg via ORAL
  Filled 2011-07-17 (×6): qty 2

## 2011-07-17 MED ORDER — HYDROMORPHONE HCL PF 1 MG/ML IJ SOLN
3.0000 mg | INTRAMUSCULAR | Status: DC | PRN
  Administered 2011-07-17 – 2011-07-19 (×12): 3 mg via INTRAVENOUS
  Filled 2011-07-17: qty 3
  Filled 2011-07-17: qty 1
  Filled 2011-07-17: qty 3
  Filled 2011-07-17: qty 2
  Filled 2011-07-17 (×9): qty 3

## 2011-07-17 MED ORDER — ASPIRIN EC 81 MG PO TBEC
81.0000 mg | DELAYED_RELEASE_TABLET | Freq: Every day | ORAL | Status: DC
  Administered 2011-07-17 – 2011-07-21 (×5): 81 mg via ORAL
  Filled 2011-07-17 (×6): qty 1

## 2011-07-17 NOTE — ED Notes (Signed)
Received pt from POD to CDU 11.  Pt is alert, oriented and in no acute distress at present.  Pt able to ambulate from hallway to stretcher in room, tolerated well.  Admitting MD at bedside.

## 2011-07-17 NOTE — ED Provider Notes (Signed)
Medical screening examination/treatment/procedure(s) were performed by non-physician practitioner and as supervising physician I was immediately available for consultation/collaboration.   Dayton Bailiff, MD 07/17/11 2125

## 2011-07-17 NOTE — ED Provider Notes (Signed)
History     CSN: [id number]  Arrival date & time 07/17/11  0550   First MD Initiated Contact with Patient 07/17/11 [phone]      Chief Complaint  Patient presents with  . Abdominal Pain    (Consider location/radiation/quality/duration/timing/severity/associated sxs/prior treatment) HPI Comments: Ryan [name] is a 61 year old male with a past medical history of diabetes, hypertension, chronic back pain with history of drug-seeking behavior and multiple narcotic prescriptions, pancreatitis, and small bowel obstruction diagnosed in January of this year who presents the emergency department for the third time in 7 days with a chief complaint of severe, nonradiating, sharp generalized abdominal pain that began 9 days ago. There is associated nausea and non-bloody vomiting, which has prevented him from taking his home medications. He denies any associated fever, chills, chest pain, shortness of breath, or change in bowel habit. His last bowel movement was yesterday, reportedly normal for him. No specific aggravating or alleviating factors. Prior treatment includes home narcotic pain medication and multiple emergency department evaluations. Prior records are reviewed and show unremarkable findings on 2 separate visits on 3/14 and 3/15 with a negative CT of the abd/pelvis on 3/15.   Patient is a 61 y.o. male presenting with abdominal pain. The history is provided by the patient and medical records.  Abdominal Pain The primary symptoms of the illness include abdominal pain.    Past Medical History  Diagnosis Date  . Hypertension   . Diabetes mellitus   . DVT (deep venous thrombosis)   . Chronic back pain   . 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 Hyperlipidemia   . CAD (coronary atherosclerotic disease)     s/p stents x2  . Small bowel obstruction     Past Surgical History  Procedure Date  . Heart stent   . Tonsillectomy and adenoidectomy   . Cholecystectomy      Family History  Problem Relation Age of Onset  . Diabetes Father     History  Substance Use Topics  . Smoking status: Current Some Day Smoker -- 0.2 packs/day for 30 years    Types: Cigarettes  . Smokeless tobacco: Never Used  . Alcohol Use: No      Review of Systems  Gastrointestinal: Positive for abdominal pain.  10 systems reviewed and are otherwise negative for acute change except as noted in the HPI.   Allergies  Lisinopril and Morphine and related  Home Medications   Current Outpatient Rx  Name Route Sig Dispense Refill  . ALPRAZOLAM 1 MG PO TABS Oral Take 1 mg by mouth 2 (two) times daily.    . ASPIRIN EC 81 MG PO TBEC Oral Take 81 mg by mouth daily.    Marland Kitchen GABAPENTIN 400 MG PO CAPS Oral Take 400 mg by mouth 3 (three) times daily.     Marland Kitchen GLIPIZIDE 10 MG PO TABS Oral Take 10 mg by mouth 2 (two) times daily before a meal.      . LOSARTAN POTASSIUM 100 MG PO TABS Oral Take 100 mg by mouth daily.    Marland Kitchen METFORMIN HCL 1000 MG PO TABS Oral Take 1,000 mg by mouth 2 (two) times daily with a meal.      . ONDANSETRON HCL 4 MG PO TABS Oral Take 4 mg by mouth every 6 (six) hours as needed. For nausea    . OXYCODONE HCL 30 MG PO TABS Oral Take 30 mg by mouth every 4 (four) hours as needed. For  pain    . RANITIDINE HCL 300 MG PO CAPS Oral Take 300 mg by mouth 2 (two) times daily.        BP 132/76  Pulse 104  Temp(Src) 97.7 F (36.5 C) (Oral)  Resp 16  SpO2 96%  Physical Exam  Constitutional: He is oriented to person, place, and time.       VS reviewed, sig for mild tachycardia at a rate of 104  HENT:  Head: Normocephalic and atraumatic.       Mucous membranes somewhat dry  Eyes: Pupils are equal, round, and reactive to light.  Neck: Normal range of motion.  Cardiovascular: Normal heart sounds.        Tachycardia with regular rhythm  Pulmonary/Chest: Effort normal and breath sounds normal. No respiratory distress. He has no wheezes. He exhibits no tenderness.   Abdominal: Soft. Bowel sounds are normal. He exhibits no distension. There is generalized tenderness. There is no rigidity, no rebound, no guarding and no CVA tenderness.  Musculoskeletal: He exhibits no edema and no tenderness.  Neurological: He is alert and oriented to person, place, and time. No cranial nerve deficit.  Skin: Skin is warm and dry.    ED Course  Procedures (including critical care time)  Labs Reviewed  URINALYSIS, ROUTINE W REFLEX MICROSCOPIC - Abnormal; Notable for the following:    Specific Gravity, Urine 1.034 (*)    Glucose, UA >1000 (*)    Ketones, ur 15 (*)    Protein, ur 30 (*)    Leukocytes, UA SMALL (*)    All other components within normal limits  COMPREHENSIVE METABOLIC PANEL - Abnormal; Notable for the following:    Sodium 134 (*)    Potassium 3.2 (*)    Glucose, Bld 329 (*)    Albumin 3.4 (*)    GFR calc non Af Amer 88 (*)    All other components within normal limits  LIPASE, BLOOD - Abnormal; Notable for the following:    Lipase 139 (*)    All other components within normal limits  CBC  TROPONIN I  URINE MICROSCOPIC-ADD ON   Dg Abd Acute W/chest  07/17/2011  *RADIOLOGY REPORT*  Clinical Data: Upper abdominal pain.  Nausea and vomiting.  ACUTE ABDOMEN SERIES (ABDOMEN 2 VIEW & CHEST 1 VIEW)  Comparison: Chest radiograph 06/14/2011 and CT abdomen pelvis 07/13/2011.  Findings: Frontal view of the chest shows midline trachea and normal heart size.  There is mild interstitial prominence bilaterally, which does not appear significantly changed from 06/14/2011.  No definite pleural fluid.  Two views of the abdomen show a fair amount of stool in the colon. There may be a few scattered minimally dilated loops of small bowel.  Postoperative changes are seen in the right upper quadrant and spine.  Old left rib fracture.  IMPRESSION:  1.  Bowel gas pattern is indicative of constipation. 2.  Interstitial prominence in the lungs may be chronic.  Original Report  Authenticated By: Reyes Ivan, M.D.    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 Pancreatitis   2. Dehydration   3. Hypokalemia   4. Diabetes mellitus       MDM  Pt with multiple co-morbidities, continued abd pain with N/V after multiple prior  evals for same recently. As negative CT scan for SBO 5 days ago, soft abd and good bowel sounds on exam as well as normal BM yesterday, doubt SBO today. I reviewed the pt abd series, c/w constipation that is likely chronic in this pt with long-term narcotic use. Labs and examination c/w dehydration- fluid resuscitation in ED. Lipase elevated today but normal 6 days ago per review of records. Will admit for pain control and continued fluid resuscitation. Labs also demonstrate mild hyponatremia that will likely be resolved with NS administration, hypokalemia that will need to be supplemented.        Shaaron Adler, PA-C 07/17/11 [address] [place], [place] 07/17/11 1110

## 2011-07-17 NOTE — ED Notes (Signed)
Pt refused IV K+ infusion due to burning at PIV site.  Pt was told that infusion can be slowed for comfort, pt declines, reports "I'll wait until I can take some food, then I'll take it".

## 2011-07-17 NOTE — H&P (Signed)
[facility] Attending Admission Note  Patient seen and examined by me, discussed with resident team.  Briefly, a 60yoM with history of recurrent bouts of pancreatitis which have been attributed to gallstones prior to his cholecystectomy 2 years ago.  On this occasion he reports pain and intractable vomiting for 8 to 9 days, accompanied by sweats but no objective fever at home.  He has been passing flatus although has not had regular bowel movements.  Denies dysuria, but notes dark urine.  Does not check his blood sugars regularly at home, reports his sugars are usually high.  Is on metformin and sulfonylurea at home for this.  Reports occasional short-lived chest pain that are episodic, last occurred early this morning.  None now, no dyspnea or nausea at this time.   Multiple recent ED visits for this episode of pain; had abd CT done on 3/15 which did not identify pancreatic pseudocyst or abscess.  His CBC shows a normal WBC count this morning.    Exam: Alert, detailed historian, not in acute distress.  Dry mucus membranes.  Neck supple.  COR S1S2, no extra sounds ABD Obese, soft, no focal tenderness on palpation.  Audible bowel sounds.  No masses noted by my exam.  LEs; Dry skin with tenting; no edema.  Bedside urinal with dark, concentrated urine.    Assess/Plan: 61 yr old M with several days of intractable vomiting and pain that is consistent with his prior bouts of nonalcoholic pancreatitis.  The fact that his lipase is relatively low may simply reflect the repeated insult to his pancreas.  He presents with significant hyperglycemia and glycosuria, and he appears to be significantly dehydrated by clinical exam.  His anion gap at 0610am was 14, and K+ low at 3.2.   Plan to aggressively rehydrate and reassess lab status.  Patient would likely benefit from insulin, and decision as to whether to institute insulin gtt versus basal with sliding scale should be guided by next CBG.  WIll need K repletion, which  patient would like to attempt orally with sips of water.   Would hold his oral hypoglycemics while he is acutely ill with pancreatitis. I do not see anything to indicate pancreatic pseudocyst (no objective fever, no leukocytosis), or UTI.  Thus, would hold off abx at the present time.  Will require close lab and clinical monitoring until he is rehydrated.  Paula Compton, MD

## 2011-07-17 NOTE — H&P (Signed)
Family Medicine Teaching [facility] Admission History and Physical  Patient name: Ryan [name] Medical record number: [id number] Date of birth: 01/19/1951 Age: 61 y.o. Gender: male  Primary Care Provider: Lucilla Edin, MD, MD  Chief Complaint: Abdominal pain, nausea, vomiting.  History of Present Illness: Ryan [name] is a 61 y.o. year old male presenting with 8 days of nausea and vomiting and abdominal pain. He states he has not been able to eat or drink anything. He states that he is peeing less frequency and his urine is dark. He has not been taking his medicines for that time either. He states he has been evaluated several times in the emergency department for this pain. He states that he has had several episodes of pancreatitis and this feels like one of his episodes. He denies any alcohol consumption.   Several days ago he had an episode of chest pain that was short and was evaluated in the ED at that time. Today he has a negative troponin.  Patient has diabetes which has been poorly controlled. He takes glipizide and metformin. He does not check his blood sugars at home.  Review Of Systems: Per HPI with the following additions: Denies headaches, shortness of breath, chest pain at this time Otherwise 12 point review of systems was performed and was unremarkable.  Patient Active Problem List  Diagnoses  . DIABETES MELLITUS, TYPE II  . HYPERTENSION  . CORONARY ARTERY DISEASE  . GERD  . CHOLELITHIASIS  . LOW BACK PAIN  . PANCREATITIS, HX OF  . Small bowel obstruction  . Hypokalemia  . Hyperglycemia    Past Medical History: Past Medical History  Diagnosis Date  . Hypertension   . Diabetes mellitus   . DVT (deep venous thrombosis)   . Chronic back pain   . 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 Hyperlipidemia   . CAD (coronary atherosclerotic disease)     s/p stents x2  . Small bowel obstruction     Past Surgical History: Past  Surgical History  Procedure Date  . Heart stent   . Tonsillectomy and adenoidectomy   . Cholecystectomy     Social History: History   Social History  . Marital Status: Married    Spouse Name: N/A    Number of Children: N/A  . Years of Education: N/A   Social History Main Topics  . Smoking status: Current Some Day Smoker -- 0.2 packs/day for 30 years    Types: Cigarettes  . Smokeless tobacco: Never Used  . Alcohol Use: No  . Drug Use: No  . Sexually Active:    Other Topics Concern  . None   Social History Narrative  . None    Family History: Family History  Problem Relation Age of Onset  . Diabetes Father     Allergies: Allergies  Allergen Reactions  . Lisinopril Swelling and Rash  . Morphine And Related Swelling and Rash    Current Facility-Administered Medications  Medication Dose Route Frequency Provider Last Rate Last Dose  . HYDROmorphone (DILAUDID) injection 1 mg  1 mg Intravenous Once [employer], PA-C   1 mg at 07/17/11 0730  . HYDROmorphone (DILAUDID) injection 1 mg  1 mg Intravenous Once Shaaron Adler, PA-C   1 mg at 07/17/11 4098  . HYDROmorphone (DILAUDID) injection 1 mg  1 mg Intravenous Once [employer], PA-C   1 mg at 07/17/11 1056  . metoCLOPramide (REGLAN) injection  10 mg  10 mg Intravenous Once Shaaron Adler, PA-C   10 mg at 07/17/11 7829  . potassium chloride 10 mEq in 100 mL IVPB  10 mEq Intravenous Once [employer], PA-C      . sodium chloride 0.9 % bolus 500 mL  500 mL Intravenous Once Shaaron Adler, PA-C   500 mL at 07/17/11 0853  . sodium chloride 0.9 % bolus 500 mL  500 mL Intravenous Once Shaaron Adler, PA-C   500 mL at 07/17/11 1057   Current Outpatient Prescriptions  Medication Sig Dispense Refill  . ALPRAZolam (XANAX) 1 MG tablet Take 1 mg by mouth 2 (two) times daily.      Marland Kitchen aspirin EC 81 MG tablet Take 81 mg by mouth daily.      Marland Kitchen gabapentin (NEURONTIN)  400 MG capsule Take 400 mg by mouth 3 (three) times daily.       Marland Kitchen glipiZIDE (GLUCOTROL) 10 MG tablet Take 10 mg by mouth 2 (two) times daily before a meal.        . losartan (COZAAR) 100 MG tablet Take 100 mg by mouth daily.      . metFORMIN (GLUCOPHAGE) 1000 MG tablet Take 1,000 mg by mouth 2 (two) times daily with a meal.        . ondansetron (ZOFRAN) 4 MG tablet Take 4 mg by mouth every 6 (six) hours as needed. For nausea      . oxycodone (ROXICODONE) 30 MG immediate release tablet Take 30 mg by mouth every 4 (four) hours as needed. For pain      . ranitidine (ZANTAC) 300 MG capsule Take 300 mg by mouth 2 (two) times daily.           Physical Exam: BP 120/88  Pulse 71  Temp(Src) 98 F (36.7 C) (Oral)  Resp 16  SpO2 95%            General: alert, cooperative and no distress HEENT: PERRLA, extra ocular movement intact and sclera clear, anicteric Heart: S1, S2 normal, no murmur, rub or gallop, regular rate and rhythm Lungs: clear to auscultation, no wheezes or rales and unlabored breathing Abdomen: moderate tenderness in the in the RUQ and in the RLQ., no rebound tenderness, no guarding or rigidity Extremities: extremities normal, atraumatic, no cyanosis or edema Musculoskeletal: no joint tenderness, deformity or swelling Skin:no rashes Neurology: normal without focal findings, mental status, speech normal, alert and oriented x3, PERLA, cranial nerves 2-12 intact and sensation grossly normal  Labs and Imaging: Lab Results  Component Value Date/Time   NA 134* 07/17/2011  6:10 AM   K 3.2* 07/17/2011  6:10 AM   CL 97 07/17/2011  6:10 AM   CO2 23 07/17/2011  6:10 AM   BUN 20 07/17/2011  6:10 AM   CREATININE 0.98 07/17/2011  6:10 AM   GLUCOSE 329* 07/17/2011  6:10 AM   Lab Results  Component Value Date   WBC 10.0 07/17/2011   HGB 16.4 07/17/2011   HCT 50.3 07/17/2011   MCV 95.3 07/17/2011   PLT 258 07/17/2011   Dg Abd Acute W/chest  07/17/2011  *RADIOLOGY REPORT*  Clinical Data: Upper  abdominal pain.  Nausea and vomiting.  ACUTE ABDOMEN SERIES (ABDOMEN 2 VIEW & CHEST 1 VIEW)  Comparison: Chest radiograph 06/14/2011 and CT abdomen pelvis 07/13/2011.  Findings: Frontal view of the chest shows midline trachea and normal heart size.  There is mild interstitial prominence bilaterally, which does not appear significantly  changed from 06/14/2011.  No definite pleural fluid.  Two views of the abdomen show a fair amount of stool in the colon. There may be a few scattered minimally dilated loops of small bowel.  Postoperative changes are seen in the right upper quadrant and spine.  Old left rib fracture.  IMPRESSION:  1.  Bowel gas pattern is indicative of constipation. 2.  Interstitial prominence in the lungs may be chronic.  Original Report Authenticated By: Reyes Ivan, M.D.     Lab Results  Component Value Date   LIPASE 139* 07/17/2011      Assessment and Plan: ANDRI PRESTIA is a 61 y.o. year old male presenting with abdominal pain, nausea, vomiting. 1. abdominal pain-patient with likely pancreatitis in the setting of increased lipase. He previously had pancreatitis episodes that were related to cholelithiasis. He had his gallbladder out recently. This episode was not triggered by alcohol per patient. It is likely he is in a chronic pancreatitis pattern. He is stable in terms of vitals. He does not appear to be in significant pain at this time. I will give him IV fluids for hydration, we will continue some pain medicine for his pain. I will place him n.p.o. until he has stopped vomiting. We'll monitor his vital signs as well as pulse ox for any sign of deterioration. 2. Diabetes-patient with poorly controlled diabetes. Will check A1c today. Plan sliding scale insulin while patient is n.p.o. Patient refused insulin at last doctor's appointment. Will have patient me with diabetes educator if possible while in the hospital. If patient's surgery his insulin, will send out on prior by  mouth regimen. 3. Hypertension-currently well controlled. We'll continue home medications for now. 4. GERD-plan to continue patient's home regimen of H2-blocker. 5. Constipation-once patient has stopped vomiting, will evaluate for need for treatment of constipation. 6. Chronic pain-patient with low back pain that is chronic. He appears to have filled several prescriptions from different providers recently per PCP notes. They will not be filling his pain medicine anymore. Per their notes, he is attached to a pain medicine clinic. We will take care of his pain will use in the hospital but will not fill any medications for him at discharge. 7. FEN/GI: We will have patient n.p.o. for now. He'll be on 150 normal saline per hour. We will check his she met and CBC in the morning. 8. Prophylaxis: Heparin 5000 units subcutaneous 9. Disposition: Pending improvement in pain and ability to take by mouth medicines and food.   SignedEllery Plunk, MD Family Medicine Resident PGY-3 07/17/2011 11:40 AM  [phone]

## 2011-07-17 NOTE — ED Notes (Signed)
Patient transported to X-ray 

## 2011-07-17 NOTE — ED Notes (Signed)
[id number] Ready 

## 2011-07-17 NOTE — ED Notes (Signed)
Pt c/o pain. MD/PA Artis Flock made aware and at bedside speaking with pt. No new orders received for pain management.

## 2011-07-17 NOTE — ED Notes (Signed)
Patient with 9 day history of abdominal pain and nausea and vomiting.  Patient states that he has been to the ED and PCP multiple times for same with no reason for the pain.

## 2011-07-18 LAB — GLUCOSE, CAPILLARY
Glucose-Capillary: 141 mg/dL — ABNORMAL HIGH (ref 70–99)
Glucose-Capillary: 120 mg/dL — ABNORMAL HIGH (ref 70–99)
Glucose-Capillary: 137 mg/dL — ABNORMAL HIGH (ref 70–99)

## 2011-07-18 LAB — COMPREHENSIVE METABOLIC PANEL
ALT: 13 U/L (ref 0–53)
AST: 15 U/L (ref 0–37)
Albumin: 3.2 g/dL — ABNORMAL LOW (ref 3.5–5.2)
Alkaline Phosphatase: 67 U/L (ref 39–117)
Potassium: 3.7 mEq/L (ref 3.5–5.1)
Sodium: 141 mEq/L (ref 135–145)
Total Protein: 6.4 g/dL (ref 6.0–8.3)

## 2011-07-18 LAB — CBC
HCT: 44.6 % (ref 39.0–52.0)
MCHC: 32.3 g/dL (ref 30.0–36.0)
MCV: 95.7 fL (ref 78.0–100.0)
RDW: 13.7 % (ref 11.5–15.5)

## 2011-07-18 LAB — MRSA PCR SCREENING: MRSA by PCR: NEGATIVE

## 2011-07-18 MED ORDER — SENNOSIDES-DOCUSATE SODIUM 8.6-50 MG PO TABS
1.0000 | ORAL_TABLET | Freq: Two times a day (BID) | ORAL | Status: DC
  Administered 2011-07-18 – 2011-07-21 (×7): 1 via ORAL
  Filled 2011-07-18 (×8): qty 1

## 2011-07-18 MED ORDER — POLYETHYLENE GLYCOL 3350 17 G PO PACK
17.0000 g | PACK | Freq: Two times a day (BID) | ORAL | Status: DC
  Administered 2011-07-18 – 2011-07-21 (×7): 17 g via ORAL
  Filled 2011-07-18 (×9): qty 1

## 2011-07-18 MED ORDER — OXYCODONE HCL 5 MG PO TABS
10.0000 mg | ORAL_TABLET | ORAL | Status: DC | PRN
  Administered 2011-07-18 – 2011-07-20 (×5): 10 mg via ORAL
  Filled 2011-07-18 (×2): qty 2
  Filled 2011-07-18: qty 1
  Filled 2011-07-18 (×3): qty 2

## 2011-07-18 NOTE — Progress Notes (Signed)
Chart reviewed, CM received request for assistance with meds, however this pt has insurance per hospital finance verification therefore, CM is unable to assist with meds.  Will follow for other d/c needs and progression. Johny Shock RN MPH Case Manager [phone]

## 2011-07-18 NOTE — Progress Notes (Signed)
Daily Progress Note Family Medicine Teaching Service PGY-1   Patient name: Ryan [name]  Medical record [id number] Date of birth:[birth year] Age: 61 y.o. Gender: male  LOS: 1 day   Subjective: complaints of pain that is not resolved with 18 mg dilaudid he has had yesterday. Asking to increase dose of pain medication. No more vomiting. One episode of nausea early morning that resolved with Zofran. Afebrile. Appetite has not improved.  Objective: Vitals: Filed Vitals:   07/18/11 0845  BP: 112/72  Pulse: 64  Temp: 98.2 F (36.8 C)  Resp: 21    Intake/Output Summary (Last 24 hours) at 07/18/11 9147 Last data filed at 07/18/11 0700  Gross per 24 hour  Intake 2537.5 ml  Output    300 ml  Net 2237.5 ml   Physical Exam: General: alert, cooperative and no distress  HEENT: PERRL, extra ocular movement intact and sclera clear, anicteric  Heart: S1, S2 normal, no murmur, rub or gallop, regular rate and rhythm  Lungs: clear to auscultation, no wheezes or rales and unlabored breathing  Abdomen: moderate tenderness in the in the RUQ and in the RLQ., no rebound tenderness, no guarding or rigidity  Extremities: extremities normal, atraumatic, no cyanosis or edema  Musculoskeletal: no joint tenderness, deformity or swelling  Skin:no rashes  Neurology: normal without focal findings, mental status, speech normal, alert and oriented x3,, cranial nerves 2-12 intact and sensation grossly normal  Labs and imaging:  CBC  Lab 07/18/11 0630 07/17/11 2021 07/17/11 0610  WBC 5.0 7.1 10.0  HGB 14.4 15.4 16.4  HCT 44.6 45.7 50.3  PLT 218 228 258   BMET  Lab 07/18/11 0630 07/17/11 2021 07/17/11 0610  NA 141 137 134*  K 3.7 3.5 3.2*  CL 108 101 97  CO2 25 26 23   BUN 11 14 20   CREATININE 0.71 0.75 0.98  LABGLOM -- -- --  GLUCOSE 150* -- --  CALCIUM 8.6 8.9 9.4   Results for orders placed during the hospital encounter of 07/17/11 (from the past 24 hour(s))  LIPID PANEL     Status:  Normal   Collection Time   07/17/11  8:21 PM      Component Value Range   Cholesterol 146  0 - 200 (mg/dL)   Triglycerides 82  <829 (mg/dL)   HDL 46  >56 (mg/dL)   Total CHOL/HDL Ratio 3.2     VLDL 16  0 - 40 (mg/dL)   LDL Cholesterol 84  0 - 99 (mg/dL)    O1H 08.6  Dg Abd Acute W/chest 07/17/2011  IMPRESSION:  1.  Bowel gas pattern is indicative of constipation. 2.  Interstitial prominence in the lungs may be chronic.  Original Report Authenticated By: Reyes Ivan, M.D.   Medications: Scheduled Meds:   . ALPRAZolam  1 mg Oral BID  . antiseptic oral rinse  15 mL Mouth Rinse q12n4p  . aspirin EC  81 mg Oral Daily  . chlorhexidine  15 mL Mouth Rinse BID  . famotidine  20 mg Oral BID  . gabapentin  400 mg Oral TID  . heparin  5,000 Units Subcutaneous Q8H  .  HYDROmorphone (DILAUDID) injection  1 mg Intravenous Once  .  HYDROmorphone (DILAUDID) injection  1 mg Intravenous Once  . insulin aspart  0-15 Units Subcutaneous Q4H  . losartan  100 mg Oral Daily  . potassium chloride  10 mEq Intravenous Once  . potassium chloride  40 mEq Oral Once  . sodium chloride  500 mL Intravenous Once  . sodium chloride  500 mL Intravenous Once  . white petrolatum      . DISCONTD: losartan  100 mg Oral Daily   Continuous Infusions:   . sodium chloride 150 mL/hr at 07/18/11 0251   PRN Meds:.HYDROmorphone, ondansetron (ZOFRAN) IV, ondansetron Assessment and Plan: Ryan [name] is a 61 y.o. year old male admitted with abdominal pain, nausea, vomiting.  1. Abdominal pain-patient with likely pancreatitis in the setting of increased lipase.  This episode was not triggered by alcohol per patient. It is likely he is in a chronic pancreatitis pattern. -NPO : still does not feel like eating. Nausea reported early am that improved with zofran. Continue NPO. -Pain control: pt has taken 18 mg of dilaudid in less than 24h. We are concerned of pain seeking behavior regardless the fact that he has the  diagnosis of pancreatitis.  We will adjust his pain medication to oxycodone PO as soon pt changes to PO food intake and just Dilaudid with reduce dose more spaced and for brake through.  2. Diabetes-patient with poorly controlled diabetes. A1C 11.6. Pt reports himself compliant. Was on sulfonylurea and metformin. Pt has archived good glucemic control on SSI.  Patient refused insulin at last doctor's appointment.  - Diabetes education - Pt will require insulin as treatment for DM upon discharge.  3. Hypertension-currently well controlled.  continue home medication. Losartan 100 mg daily.  4. GERD - continue patient's home regimen of H2-blocker.   5. Constipation: not longer vomiting,  will evaluate for need for treatment of constipation.   6. Chronic pain-patient with low back pain: . He appears to have filled several prescriptions from different providers recently per PCP notes. They will not be filling his pain medicine anymore. Per their notes, he is attached to a pain medicine clinic. We will take care of his pain will use in the hospital but WILL NOT FILL ANY MEDICATIONS FOR HIM AT DISCHARGE.   FEN/GI: NPO. Decrease NS to 146ml/h.   Prophylaxis: Heparin 5000 units subcutaneous   Disposition: Pending improvement in pain and ability to take by mouth medicines and food.   D. Piloto Rolene Arbour, MD PGY1, [facility] Medicine Teaching Service Pager [phone] 07/18/2011

## 2011-07-18 NOTE — Progress Notes (Signed)
I discussed with Dr Piloto.  I agree with their plans documented in their  Note for today.  

## 2011-07-18 NOTE — Progress Notes (Signed)
INITIAL ADULT NUTRITION ASSESSMENT Date: 07/18/2011   Time: 3:37 PM  Reason for Assessment: Consult, Nutrition Risk Report  ASSESSMENT: Male 61 y.o.  Dx: Pancreatitis chronic  Hx:  Past Medical History  Diagnosis Date  . Hypertension   . Diabetes mellitus   . DVT (deep venous thrombosis)   . Chronic back pain   . 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 Hyperlipidemia   . CAD (coronary atherosclerotic disease)     s/p stents x2  . Small bowel obstruction     Related Meds:     . ALPRAZolam  1 mg Oral BID  . antiseptic oral rinse  15 mL Mouth Rinse q12n4p  . aspirin EC  81 mg Oral Daily  . chlorhexidine  15 mL Mouth Rinse BID  . famotidine  20 mg Oral BID  . gabapentin  400 mg Oral TID  . heparin  5,000 Units Subcutaneous Q8H  . insulin aspart  0-15 Units Subcutaneous Q4H  . losartan  100 mg Oral Daily  . polyethylene glycol  17 g Oral BID  . potassium chloride  10 mEq Intravenous Once  . potassium chloride  40 mEq Oral Once  . senna-docusate  1 tablet Oral BID  . white petrolatum      . DISCONTD: losartan  100 mg Oral Daily    Ht: 6\' 4"  (193 cm)  Wt: 304 lb 7.3 oz (138.1 kg)  Ideal Wt: 91.8 kg % Ideal Wt: 150%  Usual Wt: ~ 330 lb % Usual Wt: 92%  Body mass index is 37.06 kg/(m^2).  Food/Nutrition Related Hx: unintentional weight loss > 10 lbs within the past month per admission nutrition screen  Labs:  CMP     Component Value Date/Time   NA 141 07/18/2011 0630   K 3.7 07/18/2011 0630   CL 108 07/18/2011 0630   CO2 25 07/18/2011 0630   GLUCOSE 150* 07/18/2011 0630   BUN 11 07/18/2011 0630   CREATININE 0.71 07/18/2011 0630   CALCIUM 8.6 07/18/2011 0630   PROT 6.4 07/18/2011 0630   ALBUMIN 3.2* 07/18/2011 0630   AST 15 07/18/2011 0630   ALT 13 07/18/2011 0630   ALKPHOS 67 07/18/2011 0630   BILITOT 0.8 07/18/2011 0630   GFRNONAA >90 07/18/2011 0630   GFRAA >90 07/18/2011 0630     Intake/Output Summary (Last 24 hours) at 07/18/11  1540 Last data filed at 07/18/11 1300  Gross per 24 hour  Intake 1837.5 ml  Output    575 ml  Net 1262.5 ml    CBG (last 3)   Basename 07/18/11 1134 07/18/11 0736 07/18/11 0414  GLUCAP 120* 132* 141*    Diet Order: NPO  Supplements/Tube Feeding: N/A  IVF:    sodium chloride Last Rate: 100 mL/hr at 07/18/11 1042    Estimated Nutritional Needs:   Kcal: 2300-2500 Protein: 125-135 gm Fluid: 2.2-2.4 L  RD spoke with pt re: nutrition hx -- reports not eating anything for 1 week PTA; reports experienced nausea & vomiting as well -- RD inteprests this as < 50% intake of estimated nutrition needs; also reports an approximate 20 lb weight loss during this time (6%) -- meets criteria for severe malnutrition in the context of acute illness; no skin breakdown noted  RD left handouts from [employer]: Carbohydrate Counting for People with Diabetes on pt's tray table -- he was not feeling well this RD's visitation; RD to educate at more appropriate time  NUTRITION DIAGNOSIS: -Inadequate oral intake (NI-2.1).  Status: Ongoing  RELATED TO: acute pancreatitis  AS EVIDENCE BY: NPO status  MONITORING/EVALUATION(Goals): Goal: meet >90% of estimated nutrition needs to promote energy & protein repletion Monitor: Diet advancement, labs, weight, I/O's  EDUCATION NEEDS: -Education not appropriate at this time  INTERVENTION:  Advance diet as medically appropriate  Follow-up for appropriateness for DM diet education  RD to follow for nutrition care plan, add interventions/make recommendations accordingly  Dietitian #: [phone]  DOCUMENTATION CODES Per approved criteria  -Severe malnutrition in the context of acute illness or injury -Obesity Unspecified    Alger Memos 07/18/2011, 3:37 PM

## 2011-07-18 NOTE — Progress Notes (Signed)
Received referral on this patient for uncontrolled diabetes.  Results for Ryan [name], Ryan [name] (MRN [id number]) as of 07/18/2011 14:29  Ref. Range 06/14/2011 16:08 07/17/2011 20:21  Hemoglobin A1C Latest Range: <5.7 % 12.5 11.6 (H)   Spoke with patient about his A1C of 11.6%.  Explained what an A1C measures and what his goal should be (7% or less per ADA Standards).  Explained to patient that it is important to control CBGs to prevent both acute and long-term complications.  Also talked about the possibility of starting insulin with patient at home.  Patient told me he has been through a lot of stress the past year.  Per patient, his wife left him and he has been very lonely and has not taken care of himself.  Patient told me he would like to get a "2nd chance" and try to better manage his diabetes for 6 months before starting insulin.  Patient told me he has been eating poorly and not exercising or taking his medications.  Patient told me he would try very hard for 6 months and that if his A1C was still elevated after 6 months that he would start insulin if needed.    Called [facility].  Got verbal order for Outpatient DM education.  Relayed the information that the patient told me to the [facility] MD.  Will order an RD consult for diet education here in hospital.    Will follow. Ambrose Finland RN, MSN, CDE Diabetes Coordinator Inpatient Diabetes Program [phone]

## 2011-07-19 LAB — GLUCOSE, CAPILLARY
Glucose-Capillary: 109 mg/dL — ABNORMAL HIGH (ref 70–99)
Glucose-Capillary: 173 mg/dL — ABNORMAL HIGH (ref 70–99)
Glucose-Capillary: 93 mg/dL (ref 70–99)

## 2011-07-19 MED ORDER — HYDROMORPHONE HCL PF 1 MG/ML IJ SOLN
2.0000 mg | INTRAMUSCULAR | Status: DC | PRN
  Administered 2011-07-19 – 2011-07-20 (×5): 2 mg via INTRAVENOUS
  Filled 2011-07-19 (×5): qty 2

## 2011-07-19 MED ORDER — MILK AND MOLASSES ENEMA
Freq: Once | RECTAL | Status: DC
  Filled 2011-07-19: qty 250

## 2011-07-19 NOTE — Progress Notes (Signed)
Pt refused retention enema, wants to do it later this evening after dinner. Will pass this on to the night nurse.

## 2011-07-19 NOTE — Progress Notes (Addendum)
Daily Progress Note Family Medicine Teaching Service PGY-1   Patient name: Ryan [name]  Medical record [id number] Date of birth:1950/11/01 Age: 61 y.o. Gender: male  LOS: 2 days   Subjective: Feels better but still with pain. Will like to try clear diet. Afebrile.  Objective: Vitals: Filed Vitals:   07/19/11 1019  BP: 131/80  Pulse: 60  Temp: 98.1 F (36.7 C)  Resp: 18    Intake/Output Summary (Last 24 hours) at 07/19/11 1145 Last data filed at 07/19/11 1020  Gross per 24 hour  Intake   1235 ml  Output    525 ml  Net    710 ml   Physical Exam: General: alert, cooperative and no distress  HEENT: PERRL, extra ocular movement intact and sclera clear, anicteric  Heart: S1, S2 normal, no murmur, rub or gallop, regular rate and rhythm  Lungs: clear to auscultation, no wheezes or rales and unlabored breathing  Abdomen: moderate tenderness periumbilically, no rebound tenderness, no guarding or rigidity  Extremities: extremities normal, atraumatic, no cyanosis or edema  Musculoskeletal: no joint tenderness, deformity or swelling  Skin:no rashes  Neurology: normal without focal findings, mental status, speech normal, alert and oriented x3,, cranial nerves 2-12 intact and sensation grossly normal  Labs and imaging:  CBC  Lab 07/18/11 0630 07/17/11 2021 07/17/11 0610  WBC 5.0 7.1 10.0  HGB 14.4 15.4 16.4  HCT 44.6 45.7 50.3  PLT 218 228 258   BMET  Lab 07/18/11 0630 07/17/11 2021 07/17/11 0610  NA 141 137 134*  K 3.7 3.5 3.2*  CL 108 101 97  CO2 25 26 23   BUN 11 14 20   CREATININE 0.71 0.75 0.98  LABGLOM -- -- --  GLUCOSE 150* -- --  CALCIUM 8.6 8.9 9.4    Medications: Scheduled Meds:    . ALPRAZolam  1 mg Oral BID  . antiseptic oral rinse  15 mL Mouth Rinse q12n4p  . aspirin EC  81 mg Oral Daily  . chlorhexidine  15 mL Mouth Rinse BID  . famotidine  20 mg Oral BID  . gabapentin  400 mg Oral TID  . heparin  5,000 Units Subcutaneous Q8H  . insulin  aspart  0-15 Units Subcutaneous Q4H  . losartan  100 mg Oral Daily  . milk and molasses   Rectal Once  . polyethylene glycol  17 g Oral BID  . potassium chloride  10 mEq Intravenous Once  . senna-docusate  1 tablet Oral BID   Continuous Infusions:    . sodium chloride 100 mL/hr at 07/19/11 0442   PRN Meds:.HYDROmorphone, ondansetron (ZOFRAN) IV, ondansetron, oxyCODONE Assessment and Plan: Ryan [name] is a 61 y.o. year old male admitted with abdominal pain, nausea, vomiting.   1. Abdominal pain-patient with likely pancreatitis in the setting of increased lipase.  This episode was not triggered by alcohol per patient. It is likely he is in a chronic pancreatitis pattern. - Wants to try clears. Will advance diet. - Pain control: dilaudid 27 mg in 24 h plus 40 mg of Oxycodone We are concerned of pain seeking behavior regardless the fact that he has the diagnosis of pancreatitis.  We will adjust his pain medication to oxycodone PO as soon pt changes to PO food intake and just Dilaudid with reduce dose more for brake through.  2. Diabetes-patient with poorly controlled diabetes. A1C 11.6. Pt reports himself compliant. Was on sulfonylurea and metformin. Pt has archived good glucemic control on SSI.  Patient refused insulin  at last doctor's appointment.  - Diabetes education - Close f/u as outpatient once discharged  3. Hypertension-currently well controlled.  continue home medication. Losartan 100 mg daily.  4. GERD - continue patient's home regimen of H2-blocker.   5. Constipation: not longer vomiting. senakot yesterday and still no bowel movement.  - Milk and Molases enema for today.  6. Chronic pain-patient with low back pain: . He appears to have filled several prescriptions from different providers recently per PCP notes. They will not be filling his pain medicine anymore. Per their notes, he is attached to a pain medicine clinic. We will take care of his pain will use in the  hospital but WILL NOT FILL ANY  NARCOTIC MEDICATIONS FOR HIM AT DISCHARGE. He f/u at [facility] and [facility]. He is planning to stop going to [place] for his primary care but will resume care with [facility].   FEN/GI: advance to clear diet. Decrease NS to 158ml/h.   Prophylaxis: Heparin 5000 units subcutaneous   Disposition: Pending improvement in pain and ability to take by mouth medicines and food.   D. [place] Rolene Arbour, MD PGY1, [facility] Medicine Teaching Service Pager [phone] 07/19/2011

## 2011-07-19 NOTE — Progress Notes (Signed)
I have seen and examined this patient with Dr Aviva Signs.  I agree with their findings and plans as documented in their progress note for today. If tolerating diet then would de-escalate his opiate therapy with goal of returning to his prescribed home chronic opiate therapy of oxycodone 30mg  six tablets a day. Patient was advised that receiving opiate prescriptions from multiple providers in disparate geographical locations are "Red Flags" to providers for "doctor shopping" and illicit use of opiates.  He said he understood and would be getting his oxycodone only from [facility] in [place].  He reports having only 3 tablets of oxycodone left at home.

## 2011-07-20 LAB — GLUCOSE, CAPILLARY
Glucose-Capillary: 119 mg/dL — ABNORMAL HIGH (ref 70–99)
Glucose-Capillary: 162 mg/dL — ABNORMAL HIGH (ref 70–99)
Glucose-Capillary: 168 mg/dL — ABNORMAL HIGH (ref 70–99)
Glucose-Capillary: 212 mg/dL — ABNORMAL HIGH (ref 70–99)
Glucose-Capillary: 213 mg/dL — ABNORMAL HIGH (ref 70–99)

## 2011-07-20 MED ORDER — OXYCODONE HCL 5 MG PO TABS
30.0000 mg | ORAL_TABLET | ORAL | Status: DC | PRN
  Administered 2011-07-20 – 2011-07-21 (×7): 30 mg via ORAL
  Filled 2011-07-20 (×7): qty 6

## 2011-07-20 MED ORDER — HYDROMORPHONE HCL PF 1 MG/ML IJ SOLN
1.0000 mg | INTRAMUSCULAR | Status: AC | PRN
  Administered 2011-07-20: 1 mg via INTRAVENOUS
  Filled 2011-07-20 (×2): qty 1

## 2011-07-20 NOTE — Progress Notes (Signed)
I have seen and examined this patient. I have discussed with Dr Cristal Ford.  I agree with their findings and plans as documented in their progress note for today.  Acute Issues 1. Acute exacerbation of chronic Pancreatitis. - Tolerating clear diet - Plan: advance to heart healthy diet. Titrate down opiate therapy towards his home opiate therapy regiment if tolerating advancement in diet.

## 2011-07-20 NOTE — Progress Notes (Signed)
Daily Progress Note Family Medicine Teaching Service PGY-1   Patient name: Ryan [name]  Medical record [id number] Date of birth:October 03, 1950 Age: 61 y.o. Gender: male  LOS: 3 days   Subjective: Feels better but still with pain. Had large BM after soap suds enema last night. Patient has basin with clear/watery fluid, states he vomited last pm. Nurse does not report emesis. Ate complete clears tray, agreeable to try full liquids.   Objective: Vitals: Filed Vitals:   07/20/11 0407  BP: 108/56  Pulse: 60  Temp: 98.1 F (36.7 C)  Resp: 20    Intake/Output Summary (Last 24 hours) at 07/20/11 0705 Last data filed at 07/19/11 2216  Gross per 24 hour  Intake   1160 ml  Output    725 ml  Net    435 ml   Physical Exam: General: alert, cooperative and no distress  Heart: S1, S2 normal, no murmur, rub or gallop, regular rate and rhythm  Lungs: clear to auscultation, no wheezes or rales and unlabored breathing  Abdomen: moderate tenderness periumbilically, no rebound tenderness, no guarding or rigidity  Extremities: extremities normal, atraumatic, no cyanosis or edema  Musculoskeletal: no joint tenderness, deformity or swelling  Skin:no rashes  Neurology: normal without focal findings, mental status, speech normal, alert and oriented x3,, cranial nerves 2-12 intact and sensation grossly normal  Labs and imaging:  CBC  Lab 07/18/11 0630 07/17/11 2021 07/17/11 0610  WBC 5.0 7.1 10.0  HGB 14.4 15.4 16.4  HCT 44.6 45.7 50.3  PLT 218 228 258   BMET  Lab 07/18/11 0630 07/17/11 2021 07/17/11 0610  NA 141 137 134*  K 3.7 3.5 3.2*  CL 108 101 97  CO2 25 26 23   BUN 11 14 20   CREATININE 0.71 0.75 0.98  LABGLOM -- -- --  GLUCOSE 150* -- --  CALCIUM 8.6 8.9 9.4    Medications: Scheduled Meds:    . ALPRAZolam  1 mg Oral BID  . antiseptic oral rinse  15 mL Mouth Rinse q12n4p  . aspirin EC  81 mg Oral Daily  . chlorhexidine  15 mL Mouth Rinse BID  . famotidine  20 mg  Oral BID  . gabapentin  400 mg Oral TID  . heparin  5,000 Units Subcutaneous Q8H  . insulin aspart  0-15 Units Subcutaneous Q4H  . losartan  100 mg Oral Daily  . polyethylene glycol  17 g Oral BID  . potassium chloride  10 mEq Intravenous Once  . senna-docusate  1 tablet Oral BID  . DISCONTD: milk and molasses   Rectal Once   Continuous Infusions:    . sodium chloride 100 mL/hr at 07/20/11 0003   PRN Meds:.HYDROmorphone, ondansetron (ZOFRAN) IV, ondansetron, oxyCODONE, DISCONTD: HYDROmorphone  Assessment and Plan: TAOS TAPP is a 61 y.o. year old male admitted with abdominal pain, nausea, vomiting.   1. Abdominal pain-acute on chronic pancreatitis in the setting of increased lipase.  This episode was not triggered by alcohol per patient. It is likely he is in a chronic pancreatitis pattern. - Tolerated liquids. - Pain control: dilaudid 13 mg in 24 h-has not taken Oxycodone. -Will adjust his pain medication to oxycodone 30 mg PO q4 hr prn (his home regimen) if tolerating PO food intake, reduce IV dilaudid availability.   2. Diabetes-patient with poorly controlled diabetes. A1C 11.6. Pt reports himself compliant. Was on sulfonylurea and metformin. Pt has archived good glucemic control on SSI.  Patient refused insulin at last doctor's appointment.  -  Diabetes education - Close f/u as outpatient once discharged  3. Hypertension-currently well controlled.  continue home medication. Losartan 100 mg daily.  4. GERD - continue patient's home regimen of H2-blocker.   5. Constipation: Senakot daily. Had large BM after SS enema.   6. Chronic pain-patient with low back pain: . He appears to have filled several prescriptions from different providers recently per PCP notes, check of narcotics database. [facility] has discharged pt, will not be filling his pain medicine anymore. He is attached to a pain medicine clinic, but plans to f/u with established provider at [facility]. Will provide  patient with meds to avoid withdrawal until he can see his PCP.     FEN/GI: advance to full liquid diet, then solid if tolerated. Decrease NS to 42ml/h.   Prophylaxis: Heparin 5000 units subcutaneous    Disposition: Pending improvement in pain and ability to take by mouth medicines and food. Order to ambulate, oob to chair today.   Lloyd Huger, MD [facility] Family Medicine Resident - PGY-2 07/20/2011 7:11 AM [phone]

## 2011-07-20 NOTE — Progress Notes (Signed)
Patient had wanted to wait until next pain medication given before enema. Gave pain medication and soap suds enema per MD order. Patient tolerated well. Steele Berg RN

## 2011-07-21 LAB — GLUCOSE, CAPILLARY: Glucose-Capillary: 197 mg/dL — ABNORMAL HIGH (ref 70–99)

## 2011-07-21 MED ORDER — SENNOSIDES-DOCUSATE SODIUM 8.6-50 MG PO TABS: 1.0000 | ORAL_TABLET | Freq: Two times a day (BID) | ORAL | Status: DC

## 2011-07-21 MED ORDER — OXYCODONE HCL 30 MG PO TABS: 30.0000 mg | ORAL_TABLET | ORAL | Status: AC | PRN

## 2011-07-21 NOTE — Progress Notes (Signed)
Patient still waiting on ride to discharge home.

## 2011-07-21 NOTE — Discharge Summary (Signed)
Physician Discharge Summary  Patient ID: Ryan [name] [id number] 1951/01/26 61 y.o.  Admit date: 07/17/2011 Discharge date: 07/21/2011  PCP:  PT HOSPITALIZED UNDER [facility]. Pt will like to continue only primary care with [facility] at [facility]. Pt  WILL GO TO UNASSIGNED POOL NEXT TIME HE NEEDS ADMISSION.   Discharge Diagnosis: 1. Chronic pancreatitis. 2. HTN 3. DMT2 4. HLD 5. GERD 6. [place] Obesity 7. Back pain 8. Constipation   Discharge Medications  Wilson, [place]  Home Medication Instructions [id number]   Printed on:07/21/11 1130  Medication Information                    metFORMIN (GLUCOPHAGE) 1000 MG tablet Take 1,000 mg by mouth 2 (two) times daily with a meal.             glipiZIDE (GLUCOTROL) 10 MG tablet Take 10 mg by mouth 2 (two) times daily before a meal.             ranitidine (ZANTAC) 300 MG capsule Take 300 mg by mouth 2 (two) times daily.             gabapentin (NEURONTIN) 400 MG capsule Take 400 mg by mouth 3 (three) times daily.            losartan (COZAAR) 100 MG tablet Take 100 mg by mouth daily.           oxycodone (ROXICODONE) 30 MG immediate release tablet Take 30 mg by mouth every 4 (four) hours as needed. For pain           aspirin EC 81 MG tablet Take 81 mg by mouth daily.           ondansetron (ZOFRAN) 4 MG tablet Take 4 mg by mouth every 6 (six) hours as needed. For nausea           ALPRAZolam (XANAX) 1 MG tablet Take 1 mg by mouth 2 (two) times daily.           senna-docusate (SENOKOT-S) 8.6-50 MG per tablet Take 1 tablet by mouth 2 (two) times daily.              Consults: None  Physical Exam at Discharge. General: alert, cooperative and no distress  HEENT: PERRL, extra ocular movement intact and sclera clear, anicteric  Heart: S1, S2 normal, no murmur, rub or gallop, regular rate and rhythm  Lungs: clear to auscultation, no wheezes or rales and unlabored breathing  Abdomen: mild tenderness periumbilically, no  rebound tenderness, no guarding or rigidity  Extremities: extremities normal, atraumatic, no cyanosis or edema  Musculoskeletal: no joint tenderness, deformity or swelling  Skin:no rashes  Neurology: normal without focal findings, mental status, speech normal, alert and oriented x3,, cranial nerves 2-12 intact and sensation grossly normal   Labs: CBC  Lab 07/18/11 0630 07/17/11 2021 07/17/11 0610  WBC 5.0 7.1 10.0  HGB 14.4 15.4 16.4  HCT 44.6 45.7 50.3  PLT 218 228 258   BMET  Lab 07/18/11 0630 07/17/11 2021 07/17/11 0610  NA 141 137 134*  K 3.7 3.5 3.2*  CL 108 101 97  CO2 25 26 23   BUN 11 14 20   CREATININE 0.71 0.75 0.98  CALCIUM 8.6 8.9 9.4  PROT 6.4 7.2 7.2  BILITOT 0.8 0.7 0.5  ALKPHOS 67 70 76  ALT 13 13 11   AST 15 12 14   GLUCOSE 150* 231* 329*   Lipase: 129  A1C 11.4  Procedures/Imaging:  Ct  Abdomen Pelvis W Contrast  07/13/2011  *RADIOLOGY REPORT*  Clinical Data: Several days of nausea, vomiting and generalized abdominal pain.  CT ABDOMEN AND PELVIS WITH CONTRAST  Technique:  Multidetector CT imaging of the abdomen and pelvis was performed following the standard protocol during bolus administration of intravenous contrast.  Contrast: OMNIPAQUE IOHEXOL 300 MG/ML IJ SOLN  Comparison: CT of the abdomen and pelvis performed 06/15/2011, and abdominal radiograph performed 07/11/2011  Findings: Mild atelectasis is noted at the lung bases.  The liver and spleen are unremarkable in appearance.  The patient is status post cholecystectomy, with clips noted along the gallbladder fossa.  The pancreas and adrenal glands are unremarkable.  Nonspecific perinephric stranding is noted bilaterally.  There is a vague 3.2 cm area of decreased attenuation along the medial aspect of the left kidney; this appears to have increased only mildly in size since 2009, though it is relatively high in attenuation on noncontrast images, and is likely benign in nature.  The kidneys are otherwise  unremarkable in appearance.  There is no evidence of hydronephrosis.  No renal or ureteral stones are seen.  No free fluid is identified.  The small bowel is unremarkable in appearance.  The stomach is within normal limits.  No acute vascular abnormalities are seen.  Scattered calcification is noted along the distal abdominal aorta and its branches.  The appendix is normal in caliber and contains minimal contrast, without evidence for appendicitis.  The cecum is flipped anteriorly, just to the left of midline.  Contrast progresses to the level of the rectum.  The colon is grossly unremarkable in appearance.  The bladder is mildly distended and grossly unremarkable in appearance.  The prostate remains normal in size, with scattered calcification.  No inguinal lymphadenopathy is seen; scattered bilateral inguinal nodes remain borderline normal in size.  No acute osseous abnormalities are identified.  Lumbar spinal fusion hardware is noted.  IMPRESSION:  1.  No acute abnormalities seen within the abdomen or pelvis. 2. Vague 3.2 cm area of decreased attenuation along the medial aspect of the left kidney; this appears to have increased only mildly in size since 2009, despite relatively high attenuation, and is likely benign in nature. 3.  Mild bibasilar atelectasis. 4.  Scattered calcification along the distal abdominal aorta and its branches.  Original Report Authenticated By: Tonia Ghent, M.D.   Dg Abd Acute W/chest  07/17/2011  *RADIOLOGY REPORT*  Clinical Data: Upper abdominal pain.  Nausea and vomiting.  ACUTE ABDOMEN SERIES (ABDOMEN 2 VIEW & CHEST 1 VIEW)  Comparison: Chest radiograph 06/14/2011 and CT abdomen pelvis 07/13/2011.  Findings: Frontal view of the chest shows midline trachea and normal heart size.  There is mild interstitial prominence bilaterally, which does not appear significantly changed from 06/14/2011.  No definite pleural fluid.  Two views of the abdomen show a fair amount of stool in the  colon. There may be a few scattered minimally dilated loops of small bowel.  Postoperative changes are seen in the right upper quadrant and spine.  Old left rib fracture.  IMPRESSION:  1.  Bowel gas pattern is indicative of constipation. 2.  Interstitial prominence in the lungs may be chronic.  Original Report Authenticated By: Reyes Ivan, M.D.   Dg Abd Acute W/chest  07/11/2011  *RADIOLOGY REPORT*  Clinical Data: Abdominal pain, emesis.  ACUTE ABDOMEN SERIES (ABDOMEN 2 VIEW & CHEST 1 VIEW)  Comparison: 06/15/2011 radiograph and CT  Findings: Heart size within normal limits.  Central vascular  fullness.  Mild bibasilar airspace opacities, similar to prior.  No pleural effusion.  No pneumothorax.  Air present within loops of large and small bowel.  No free intraperitoneal air.  Organ outlines normal where seen.  Surgical clips right upper quadrant.  L4-5 posterior fusion.  No acute osseous abnormality identified.  IMPRESSION: No evidence for obstruction.  Bibasilar opacities are similar to prior; atelectasis, infection, or edema.  Original Report Authenticated By: Waneta Martins, M.D.     Brief Hospital Course: YUMA PACELLA is a 61 y.o. year old male admitted with abdominal pain, nausea, vomiting and was found to have acutely elevated lipase.  1. Abdominal pain: Patient with likely pancreatitis in the setting of increased lipase (129) This episode was not triggered by alcohol per patient. It is likely he is in a chronic pancreatitis pattern. Pt was NPO for 2 days and transition easily to clears and then full diet with no vomiting or upper abdominal pain.  Pt has a high tolerance to narcotic medication and required 18-27 mg of dilaudid per day decreasing to 13 and lastly completely off dilaudid and on his home Oxycodone 30 mg Q4 PRN that was taking it around the clock.  We had a long conversation with pt since we discovered that he obtains his controlled substances from different  places. -[facility] in [place] -[facility] in [place] -[facility] in [place] -[facility] hospital when he needs acute care. Pt pain was controlled on his home dose 30 mg Oxycodone Q4 PRN and was discharge home. Pt needs to consolidate his pain management in one place. He does not want to go to [facility] as primary care and will resume care with [facility].  2. Diabetes- Patient with poorly controlled diabetes. A1C 11.6. Pt reports himself compliant. Was on sulfonylurea and metformin. Pt has archived good glycemic control on SSI. Patient refused insulin at discharge. He received DM education while inpatient and it is recommended close f/u as outpatient.  3. Hypertension-well controlled on home medication. Losartan 100 mg daily.   4. GERD: Continue patient's home regimen of H2-blocker.   5. Constipation: His pain was mostly periumbilical and XR of abdomen and Ct abdominal showed marked constipation. Pt had one enema and resulted in a large bowel movement. We discharge pt with Senakot regimen.    Patient condition at time of discharge/disposition:  Patient is discharge home on stable medical condition.   Follow up issues: 1. Gave pt only 6 tablets of Oxycodone 30 mg. Pt agree to it and will  f/u with [facility] at [place].   D. [place] Rolene Arbour, MD  [facility] 07/21/2011

## 2011-07-22 NOTE — Discharge Summary (Signed)
I discussed with Dr Aviva Signs.  I agree with their plans documented in their discharge note.Ryan [name]

## 2011-08-22 ENCOUNTER — Inpatient Hospital Stay (HOSPITAL_COMMUNITY)
Admission: EM | Admit: 2011-08-22 | Discharge: 2011-08-24 | DRG: 390 | Disposition: A | Discharge status: Home / Self Care | Payer: Medicare Other | Attending: Internal Medicine | Admitting: Internal Medicine

## 2011-08-22 ENCOUNTER — Encounter (HOSPITAL_COMMUNITY): Payer: Self-pay | Admitting: Emergency Medicine

## 2011-08-22 ENCOUNTER — Inpatient Hospital Stay (HOSPITAL_COMMUNITY): Payer: Medicare Other

## 2011-08-22 ENCOUNTER — Emergency Department (HOSPITAL_COMMUNITY): Payer: Medicare Other

## 2011-08-22 DIAGNOSIS — K56600 Partial intestinal obstruction, unspecified as to cause: Secondary | ICD-10-CM

## 2011-08-22 DIAGNOSIS — Z8719 Personal history of other diseases of the digestive system: Secondary | ICD-10-CM

## 2011-08-22 DIAGNOSIS — IMO0001 Reserved for inherently not codable concepts without codable children: Secondary | ICD-10-CM | POA: Diagnosis present

## 2011-08-22 DIAGNOSIS — E785 Hyperlipidemia, unspecified: Secondary | ICD-10-CM | POA: Diagnosis present

## 2011-08-22 DIAGNOSIS — G8929 Other chronic pain: Secondary | ICD-10-CM | POA: Diagnosis present

## 2011-08-22 DIAGNOSIS — T40605A Adverse effect of unspecified narcotics, initial encounter: Secondary | ICD-10-CM | POA: Diagnosis present

## 2011-08-22 DIAGNOSIS — M545 Low back pain, unspecified: Secondary | ICD-10-CM | POA: Diagnosis present

## 2011-08-22 DIAGNOSIS — F17200 Nicotine dependence, unspecified, uncomplicated: Secondary | ICD-10-CM | POA: Diagnosis present

## 2011-08-22 DIAGNOSIS — E876 Hypokalemia: Secondary | ICD-10-CM

## 2011-08-22 DIAGNOSIS — E119 Type 2 diabetes mellitus without complications: Secondary | ICD-10-CM

## 2011-08-22 DIAGNOSIS — K861 Other chronic pancreatitis: Secondary | ICD-10-CM

## 2011-08-22 DIAGNOSIS — I2510 Atherosclerotic heart disease of native coronary artery without angina pectoris: Secondary | ICD-10-CM | POA: Diagnosis present

## 2011-08-22 DIAGNOSIS — K56609 Unspecified intestinal obstruction, unspecified as to partial versus complete obstruction: Secondary | ICD-10-CM

## 2011-08-22 DIAGNOSIS — I10 Essential (primary) hypertension: Secondary | ICD-10-CM | POA: Diagnosis present

## 2011-08-22 DIAGNOSIS — R739 Hyperglycemia, unspecified: Secondary | ICD-10-CM

## 2011-08-22 DIAGNOSIS — Z79899 Other long term (current) drug therapy: Secondary | ICD-10-CM

## 2011-08-22 DIAGNOSIS — K219 Gastro-esophageal reflux disease without esophagitis: Secondary | ICD-10-CM

## 2011-08-22 DIAGNOSIS — E669 Obesity, unspecified: Secondary | ICD-10-CM | POA: Diagnosis present

## 2011-08-22 DIAGNOSIS — Z7982 Long term (current) use of aspirin: Secondary | ICD-10-CM

## 2011-08-22 DIAGNOSIS — R109 Unspecified abdominal pain: Secondary | ICD-10-CM

## 2011-08-22 DIAGNOSIS — K5909 Other constipation: Secondary | ICD-10-CM | POA: Diagnosis present

## 2011-08-22 DIAGNOSIS — Z86718 Personal history of other venous thrombosis and embolism: Secondary | ICD-10-CM

## 2011-08-22 DIAGNOSIS — Z9861 Coronary angioplasty status: Secondary | ICD-10-CM

## 2011-08-22 LAB — COMPREHENSIVE METABOLIC PANEL
Albumin: 4 g/dL (ref 3.5–5.2)
BUN: 11 mg/dL (ref 6–23)
Creatinine, Ser: 0.78 mg/dL (ref 0.50–1.35)
Total Protein: 8.4 g/dL — ABNORMAL HIGH (ref 6.0–8.3)

## 2011-08-22 LAB — DIFFERENTIAL
Basophils Relative: 1 % (ref 0–1)
Eosinophils Absolute: 0 10*3/uL (ref 0.0–0.7)
Eosinophils Relative: 0 % (ref 0–5)
Monocytes Absolute: 0.5 10*3/uL (ref 0.1–1.0)
Monocytes Relative: 10 % (ref 3–12)

## 2011-08-22 LAB — GLUCOSE, CAPILLARY: Glucose-Capillary: 250 mg/dL — ABNORMAL HIGH (ref 70–99)

## 2011-08-22 LAB — URINE MICROSCOPIC-ADD ON

## 2011-08-22 LAB — URINALYSIS, ROUTINE W REFLEX MICROSCOPIC
Glucose, UA: 500 mg/dL — AB
Nitrite: NEGATIVE
Protein, ur: 100 mg/dL — AB
pH: 8.5 — ABNORMAL HIGH (ref 5.0–8.0)

## 2011-08-22 LAB — CBC
HCT: 46.3 % (ref 39.0–52.0)
Hemoglobin: 15.2 g/dL (ref 13.0–17.0)
MCH: 31.1 pg (ref 26.0–34.0)
MCHC: 32.8 g/dL (ref 30.0–36.0)
MCV: 94.7 fL (ref 78.0–100.0)

## 2011-08-22 LAB — MRSA PCR SCREENING: MRSA by PCR: NEGATIVE

## 2011-08-22 LAB — LIPASE, BLOOD: Lipase: 29 U/L (ref 11–59)

## 2011-08-22 MED ORDER — FAMOTIDINE 20 MG PO TABS
20.0000 mg | ORAL_TABLET | Freq: Every day | ORAL | Status: DC
  Administered 2011-08-22 – 2011-08-24 (×3): 20 mg via ORAL
  Filled 2011-08-22 (×3): qty 1

## 2011-08-22 MED ORDER — HYDROMORPHONE HCL PF 1 MG/ML IJ SOLN
0.5000 mg | Freq: Once | INTRAMUSCULAR | Status: AC
  Administered 2011-08-22: 0.5 mg via INTRAVENOUS
  Filled 2011-08-22: qty 1

## 2011-08-22 MED ORDER — POTASSIUM CHLORIDE CRYS ER 20 MEQ PO TBCR
40.0000 meq | EXTENDED_RELEASE_TABLET | Freq: Once | ORAL | Status: AC
  Administered 2011-08-22: 40 meq via ORAL
  Filled 2011-08-22: qty 2

## 2011-08-22 MED ORDER — ONDANSETRON HCL 4 MG/2ML IJ SOLN: 4.0000 mg | Freq: Four times a day (QID) | INTRAMUSCULAR | Status: DC | PRN

## 2011-08-22 MED ORDER — HYDROMORPHONE HCL PF 1 MG/ML IJ SOLN
0.5000 mg | Freq: Once | INTRAMUSCULAR | Status: AC
  Administered 2011-08-22: 0.5 mg via INTRAVENOUS

## 2011-08-22 MED ORDER — ALPRAZOLAM 0.5 MG PO TABS
1.0000 mg | ORAL_TABLET | Freq: Two times a day (BID) | ORAL | Status: DC
  Administered 2011-08-22 – 2011-08-24 (×5): 1 mg via ORAL
  Filled 2011-08-22 (×3): qty 1
  Filled 2011-08-22 (×3): qty 2

## 2011-08-22 MED ORDER — ALBUTEROL SULFATE (5 MG/ML) 0.5% IN NEBU: 2.5000 mg | INHALATION_SOLUTION | RESPIRATORY_TRACT | Status: DC | PRN

## 2011-08-22 MED ORDER — LIP MEDEX EX OINT
1.0000 "application " | TOPICAL_OINTMENT | Freq: Two times a day (BID) | CUTANEOUS | Status: DC
  Administered 2011-08-22 – 2011-08-24 (×4): 1 via TOPICAL
  Filled 2011-08-22: qty 7

## 2011-08-22 MED ORDER — POTASSIUM CHLORIDE IN NACL 20-0.9 MEQ/L-% IV SOLN
INTRAVENOUS | Status: AC
  Administered 2011-08-22: 13:00:00 via INTRAVENOUS
  Filled 2011-08-22: qty 1000

## 2011-08-22 MED ORDER — SACCHAROMYCES BOULARDII 250 MG PO CAPS
250.0000 mg | ORAL_CAPSULE | Freq: Two times a day (BID) | ORAL | Status: DC
  Administered 2011-08-22 – 2011-08-24 (×5): 250 mg via ORAL
  Filled 2011-08-22 (×6): qty 1

## 2011-08-22 MED ORDER — IOHEXOL 300 MG/ML  SOLN: 20.0000 mL | INTRAMUSCULAR | Status: AC

## 2011-08-22 MED ORDER — INSULIN GLARGINE 100 UNIT/ML ~~LOC~~ SOLN
10.0000 [IU] | Freq: Every day | SUBCUTANEOUS | Status: DC
  Administered 2011-08-22 – 2011-08-23 (×2): 10 [IU] via SUBCUTANEOUS

## 2011-08-22 MED ORDER — SENNOSIDES-DOCUSATE SODIUM 8.6-50 MG PO TABS
1.0000 | ORAL_TABLET | Freq: Two times a day (BID) | ORAL | Status: DC
  Administered 2011-08-22 – 2011-08-24 (×5): 1 via ORAL
  Filled 2011-08-22 (×6): qty 1

## 2011-08-22 MED ORDER — SODIUM CHLORIDE 0.9 % IJ SOLN
3.0000 mL | Freq: Two times a day (BID) | INTRAMUSCULAR | Status: DC
  Administered 2011-08-23: 3 mL via INTRAVENOUS

## 2011-08-22 MED ORDER — INSULIN ASPART 100 UNIT/ML ~~LOC~~ SOLN
10.0000 [IU] | Freq: Once | SUBCUTANEOUS | Status: AC
  Administered 2011-08-22: 10 [IU] via INTRAVENOUS
  Filled 2011-08-22: qty 10

## 2011-08-22 MED ORDER — ONDANSETRON HCL 4 MG PO TABS: 4.0000 mg | ORAL_TABLET | Freq: Four times a day (QID) | ORAL | Status: DC | PRN

## 2011-08-22 MED ORDER — LOSARTAN POTASSIUM 50 MG PO TABS
100.0000 mg | ORAL_TABLET | Freq: Every day | ORAL | Status: DC
  Administered 2011-08-22 – 2011-08-24 (×3): 100 mg via ORAL
  Filled 2011-08-22 (×3): qty 2

## 2011-08-22 MED ORDER — HYDROMORPHONE HCL PF 1 MG/ML IJ SOLN
INTRAMUSCULAR | Status: AC
  Administered 2011-08-22: 0.5 mg via INTRAVENOUS
  Filled 2011-08-22: qty 1

## 2011-08-22 MED ORDER — IOHEXOL 300 MG/ML  SOLN
100.0000 mL | Freq: Once | INTRAMUSCULAR | Status: AC | PRN
  Administered 2011-08-22: 100 mL via INTRAVENOUS

## 2011-08-22 MED ORDER — BISACODYL 10 MG RE SUPP
10.0000 mg | Freq: Every day | RECTAL | Status: DC
  Administered 2011-08-23: 10 mg via RECTAL
  Filled 2011-08-22 (×2): qty 1

## 2011-08-22 MED ORDER — PANTOPRAZOLE SODIUM 40 MG IV SOLR
40.0000 mg | Freq: Every day | INTRAVENOUS | Status: DC
  Administered 2011-08-22 – 2011-08-23 (×2): 40 mg via INTRAVENOUS
  Filled 2011-08-22 (×2): qty 40

## 2011-08-22 MED ORDER — HYDROMORPHONE HCL PF 1 MG/ML IJ SOLN
1.0000 mg | INTRAMUSCULAR | Status: DC | PRN
  Administered 2011-08-22 (×2): 1 mg via INTRAVENOUS
  Filled 2011-08-22 (×2): qty 1

## 2011-08-22 MED ORDER — NICOTINE 14 MG/24HR TD PT24
14.0000 mg | MEDICATED_PATCH | Freq: Every day | TRANSDERMAL | Status: DC
  Administered 2011-08-23: 14 mg via TRANSDERMAL
  Filled 2011-08-22 (×3): qty 1

## 2011-08-22 MED ORDER — ONDANSETRON HCL 4 MG/2ML IJ SOLN
4.0000 mg | Freq: Once | INTRAMUSCULAR | Status: AC
  Administered 2011-08-22: 4 mg via INTRAVENOUS
  Filled 2011-08-22: qty 2

## 2011-08-22 MED ORDER — HYDROMORPHONE HCL PF 1 MG/ML IJ SOLN
2.0000 mg | INTRAMUSCULAR | Status: DC | PRN
  Administered 2011-08-22 – 2011-08-23 (×6): 2 mg via INTRAVENOUS
  Filled 2011-08-22 (×2): qty 2
  Filled 2011-08-22 (×3): qty 1
  Filled 2011-08-22 (×2): qty 2

## 2011-08-22 MED ORDER — POTASSIUM CHLORIDE IN NACL 20-0.9 MEQ/L-% IV SOLN
INTRAVENOUS | Status: DC
  Administered 2011-08-22: 11:00:00 via INTRAVENOUS
  Filled 2011-08-22 (×4): qty 1000

## 2011-08-22 MED ORDER — SODIUM CHLORIDE 0.9 % IV BOLUS (SEPSIS)
1000.0000 mL | Freq: Once | INTRAVENOUS | Status: AC
  Administered 2011-08-22: 1000 mL via INTRAVENOUS

## 2011-08-22 MED ORDER — IPRATROPIUM BROMIDE 0.02 % IN SOLN: 0.5000 mg | RESPIRATORY_TRACT | Status: DC | PRN

## 2011-08-22 MED ORDER — INSULIN ASPART 100 UNIT/ML ~~LOC~~ SOLN
0.0000 [IU] | Freq: Three times a day (TID) | SUBCUTANEOUS | Status: DC
  Administered 2011-08-22: 9 [IU] via SUBCUTANEOUS
  Administered 2011-08-23: 3 [IU] via SUBCUTANEOUS
  Administered 2011-08-23: 5 [IU] via SUBCUTANEOUS

## 2011-08-22 MED ORDER — SORBITOL 70 % SOLN
960.0000 mL | TOPICAL_OIL | Freq: Once | ORAL | Status: DC
  Filled 2011-08-22: qty 240

## 2011-08-22 MED ORDER — GLIPIZIDE 10 MG PO TABS
10.0000 mg | ORAL_TABLET | Freq: Two times a day (BID) | ORAL | Status: DC
  Administered 2011-08-22 – 2011-08-23 (×2): 10 mg via ORAL
  Filled 2011-08-22 (×6): qty 1

## 2011-08-22 MED ORDER — ASPIRIN EC 81 MG PO TBEC
81.0000 mg | DELAYED_RELEASE_TABLET | Freq: Every day | ORAL | Status: DC
  Administered 2011-08-22 – 2011-08-24 (×3): 81 mg via ORAL
  Filled 2011-08-22 (×3): qty 1

## 2011-08-22 MED ORDER — SORBITOL 70 % SOLN
960.0000 mL | TOPICAL_OIL | Freq: Once | ORAL | Status: AC | PRN
  Filled 2011-08-22: qty 240

## 2011-08-22 MED ORDER — GABAPENTIN 400 MG PO CAPS
400.0000 mg | ORAL_CAPSULE | Freq: Three times a day (TID) | ORAL | Status: DC
  Administered 2011-08-22 – 2011-08-24 (×7): 400 mg via ORAL
  Filled 2011-08-22 (×11): qty 1

## 2011-08-22 NOTE — ED Notes (Signed)
MD at bedside. 

## 2011-08-22 NOTE — H&P (Signed)
PCP:   Dr. Gay Filler   Chief Complaint:  Abdominal pain, nausea and vomiting  HPI: This is a 61 year old male who presents with complaint of abdominal pain, mild nausea, vomiting and diarrhea. He states his symptoms have been present for approximately 2-3 days. It is centrally located. Its radiates to both side and then around the back. He states the pain is constant. He ranks it as 10 out of 10. He states he has had chills and but no fevers. The patient does have a history of recurrent small bowel obstruction. He finally came to the ER.  Off note, the patient was seen by family practice in the past. On the last note, they stated clearly patient is discharge from their service and should going to the unassigned pool. This was and there last note: 6. Chronic pain-patient with low back pain: . He appears to have filled several prescriptions from different providers recently per PCP notes, check of narcotics database. [facility] has discharged pt, will not be filling his pain medicine anymore. He is attached to a pain medicine clinic, but plans to f/u with established provider at [facility]. Will provide patient with meds to avoid withdrawal until he can see his PCP.  PT HOSPITALIZED UNDER [facility]. Pt will like to continue only primary care with [facility]. Pt WILL GO TO UNASSIGNED POOL NEXT TIME HE NEEDS ADMISSION.   Review of Systems: Positives bolded  The patient denies anorexia, fever, weight loss,, vision loss, decreased hearing, hoarseness, chest pain, syncope, dyspnea on exertion, peripheral edema, balance deficits, hemoptysis, abdominal pain, melena, hematochezia, severe indigestion/heartburn, hematuria, incontinence, genital sores, muscle weakness, suspicious skin lesions, transient blindness, difficulty walking, depression, unusual weight change, abnormal bleeding, enlarged lymph nodes, angioedema, and breast masses.  Past Medical History: Past Medical History  Diagnosis Date  .  Hypertension   . Diabetes mellitus   . DVT (deep venous thrombosis)   . Chronic back pain   . 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 Hyperlipidemia   . CAD (coronary atherosclerotic disease)     s/p stents x2  . Small bowel obstruction    Past Surgical History  Procedure Date  . Heart stent   . Tonsillectomy and adenoidectomy   . Cholecystectomy   . Back surgery     Medications: Prior to Admission medications   Medication Sig Start Date End Date Taking? Authorizing Provider  ALPRAZolam Prudy Feeler) 1 MG tablet Take 1 mg by mouth 2 (two) times daily.   Yes Historical Provider, MD  aspirin EC 81 MG tablet Take 81 mg by mouth daily.   Yes Historical Provider, MD  gabapentin (NEURONTIN) 400 MG capsule Take 400 mg by mouth 3 (three) times daily.    Yes Historical Provider, MD  glipiZIDE (GLUCOTROL) 10 MG tablet Take 10 mg by mouth 2 (two) times daily before a meal.     Yes Historical Provider, MD  losartan (COZAAR) 100 MG tablet Take 100 mg by mouth daily.   Yes Historical Provider, MD  metFORMIN (GLUCOPHAGE) 1000 MG tablet Take 1,000 mg by mouth 2 (two) times daily with a meal.     Yes Historical Provider, MD  ondansetron (ZOFRAN) 4 MG tablet Take 4 mg by mouth every 6 (six) hours as needed. For nausea   Yes Historical Provider, MD  oxycodone (ROXICODONE) 30 MG immediate release tablet Take 30 mg by mouth every 4 (four) hours as needed. For pain   Yes Historical Provider, MD  ranitidine (ZANTAC) 300 MG capsule Take 300 mg by mouth 2 (two) times daily.     Yes Historical Provider, MD  senna-docusate (SENOKOT-S) 8.6-50 MG per tablet Take 1 tablet by mouth 2 (two) times daily. 07/21/11 07/20/12 Yes Dayarmys Piloto de Criselda Peaches, MD    Allergies:   Allergies  Allergen Reactions  . Lisinopril Swelling and Rash  . Morphine And Related Swelling and Rash    Social History:  reports that he has been smoking Cigarettes.  He has a 7.5 pack-year smoking history. He has never  used smokeless tobacco. He reports that he does not drink alcohol or use illicit drugs.  Family History: Family History  Problem Relation Age of Onset  . Diabetes Father     Physical Exam: Filed Vitals:   08/22/11 0207  BP: 148/74  Pulse: 102  Temp: 98.4 F (36.9 C)  TempSrc: Oral  Resp: 18  SpO2: 96%    General:  Alert and oriented times three, obese, no acute distress Eyes: PERRLA, pink conjunctiva, no scleral icterus ENT: Dry oral mucosa, neck supple, no thyromegaly, poor oral care, positive dental caries Lungs: clear to ascultation, no wheeze, no crackles, no use of accessory muscles Cardiovascular: regular rate and rhythm, no regurgitation, no gallops, no murmurs. No carotid bruits, no JVD Abdomen: soft, positive BS, positive tenderness to palpation greatest in the epigastric region non-distended, no organomegaly,  GU: not examined Neuro: CN II - XII grossly intact, sensation intact Musculoskeletal: strength 5/5 all extremities, no clubbing, cyanosis or edema Skin: no rash, no subcutaneous crepitation, no decubitus, extreme xerosis bilateral feet    Labs on Admission:   [facility] 08/22/11 0214  NA 142  K 3.3*  CL 98  CO2 30  GLUCOSE 358*  BUN 11  CREATININE 0.78  CALCIUM 10.5  MG --  PHOS --    Basename 08/22/11 0214  AST 11  ALT 10  ALKPHOS 100  BILITOT 0.2*  PROT 8.4*  ALBUMIN 4.0    Basename 08/22/11 0214  LIPASE 29  AMYLASE --    Basename 08/22/11 0214  WBC 5.1  NEUTROABS 3.1  HGB 15.2  HCT 46.3  MCV 94.7  PLT 330   No results found for this basename: CKTOTAL:3,CKMB:3,CKMBINDEX:3,TROPONINI:3 in the last 72 hours No components found with this basename: POCBNP:3 No results found for this basename: DDIMER:2 in the last 72 hours No results found for this basename: HGBA1C:2 in the last 72 hours No results found for this basename: CHOL:2,HDL:2,LDLCALC:2,TRIG:2,CHOLHDL:2,LDLDIRECT:2 in the last 72 hours No results found for this basename:  TSH,T4TOTAL,FREET3,T3FREE,THYROIDAB in the last 72 hours No results found for this basename: VITAMINB12:2,FOLATE:2,FERRITIN:2,TIBC:2,IRON:2,RETICCTPCT:2 in the last 72 hours  Micro Results: No results found for this or any previous visit (from the past 240 hour(s)). Results for DARWYN, PONZO (MRN [id number]) as of 08/22/2011 05:59  Ref. Range 08/22/2011 04:13  Color, Urine Latest Range: YELLOW  AMBER (A)  APPearance Latest Range: CLEAR  CLEAR  Specific Gravity, Urine Latest Range: 1.005-1.030  1.039 (H)  pH Latest Range: 5.0-8.0  8.5 (H)  Glucose, UA Latest Range: NEGATIVE mg/dL 119 (A)  Bilirubin Urine Latest Range: NEGATIVE  SMALL (A)  Ketones, ur Latest Range: NEGATIVE mg/dL 40 (A)  Protein Latest Range: NEGATIVE mg/dL 147 (A)  Urobilinogen, UA Latest Range: 0.0-1.0 mg/dL 0.2  Nitrite Latest Range: NEGATIVE  NEGATIVE  Leukocytes, UA Latest Range: NEGATIVE  TRACE (A)  Hgb urine dipstick Latest Range: NEGATIVE  NEGATIVE  Urine-Other No range found MUCOUS PRESENT  WBC, UA Latest Range: <3 WBC/hpf 11-20  RBC / HPF Latest Range: <3 RBC/hpf 0-2  Squamous Epithelial / LPF Latest Range: RARE  RARE  Bacteria, UA Latest Range: RARE  RARE    Radiological Exams on Admission: Ct Abdomen Pelvis W Contrast  08/22/2011  *RADIOLOGY REPORT*  Clinical Data: Nausea, vomiting, chills, abdominal pain  CT ABDOMEN AND PELVIS WITH CONTRAST  Technique:  Multidetector CT imaging of the abdomen and pelvis was performed following the standard protocol during bolus administration of intravenous contrast.  Contrast: OMNIPAQUE IOHEXOL 300 MG/ML  SOLN  Comparison: 07/13/2011  Findings: Suggestion of patchy air space disease in the lung bases which might represent atelectasis or early infiltrative process.  Surgical absence of the gallbladder.  Mild physiologic bile duct dilatation.  The liver, spleen, pancreas, adrenal glands, stomach, abdominal aorta, and retroperitoneal lymph nodes are otherwise unremarkable.   Poorly defined low attenuation region in the lower pole of the left kidney medially measures 2.4 x 2.2 cm.  This appears to be enlarging since the previous study.  This could represent a complex cyst but a developing mass is not excluded. Recommend further evaluation with ultrasound to confirm cystic nature of this lesion.  Small sub centimeter parenchymal cysts in both kidneys.  The nephrograms are somewhat heterogeneous which might represent evidence of bilateral pyelonephritis.  Clinical correlation is recommended.  There is mild proximal dilatation of small bowel with distal small bowel decompression.  The transition zone appears to be in the right lower quadrant.  No obstructing masses demonstrated in changes are likely to represent adhesions.  No free fluid or free air in the abdomen.  Stool filled colon is decompressed. Fat containing umbilical hernia area is unchanged.  Pelvis mildly enlarged prostate gland with calcification.  Mild thickening of bladder wall, possibly due to hypertrophy or decompression.  The appendix is normal.  Postoperative changes and degenerative changes in the lower lumbar spine.  No free or loculated pelvic fluid collections.  No inflammatory changes in the sigmoid colon.  No significant pelvic lymphadenopathy.  IMPRESSION:  1.  Proximal small bowel dilatation with distal decompression suggesting partial obstruction. 2.  Heterogeneous parenchymal nephrograms bilaterally may represent changes of pyelonephritis.  Clinical correlation recommended. 3.  Suggestion of enlarging hypodense lesion in the left kidney lower pole.  Recommend ultrasound to exclude cystic versus solid mass. 4.  Fat containing umbilical hernia.  Original Report Authenticated By: Marlon Pel, M.D.    Assessment/Plan Present on Admission:  . partial SBO (small bowel obstruction) Admit to telemetry N.p.o. status, IV fluid hydration Surgery consulted, KUB in the a.m. Patient's when necessary by mouth  pain medication discontinued, IV pain meds ordered. Would recommend limiting pain medications as this could be was causing patient's recurrent small bowel obstruction NG tube currently not ordered, patient currently not actively vomiting. Will attempt to treat with bowel rest. .DIABETES MELLITUS, TYPE II-uncontrolled ADA diet, sliding scale insulin Patient with hyperglycemia, will give patient some Lantus low dose. Care for hypoglycemia since patient is n.p.o.  .CORONARY ARTERY DISEASE .HYPERTENSION .LOW BACK PAIN Stable resume home medications  Care for possible drug-seeking behavior  Full code DVT prophylaxis Team 2/Dr. Nadara Mustard, Reynol Arnone 08/22/2011, 5:53 AM

## 2011-08-22 NOTE — ED Notes (Signed)
Patient states that he continues to have pain 

## 2011-08-22 NOTE — ED Notes (Signed)
Patient transported to CT 

## 2011-08-22 NOTE — ED Notes (Signed)
PT. REPORTS VOMITTING AND DIARRHEA WITH MID ABDOMINAL PAIN ONSET 4 DAYS AGO , SLIGHT CHILLS.

## 2011-08-22 NOTE — ED Notes (Signed)
Patient cont. To request more pain medication.

## 2011-08-22 NOTE — ED Notes (Signed)
Patient was asking RN what kind of pain medication she was giving him and dosage.  When RN told patient that he was getting 0.5 mg IV, he said that would never work for him.

## 2011-08-22 NOTE — ED Provider Notes (Signed)
History     CSN: [id number]  Arrival date & time 08/22/11  0203   First MD Initiated Contact with Patient 08/22/11 0250      Chief Complaint  Patient presents with  . Emesis    (Consider location/radiation/quality/duration/timing/severity/associated sxs/prior treatment) HPI Pt seen last month for similar complaints. P/w N/V/D x 3 days with generalized abdominal pain. No blood in stool or vomit. States he has been unable to keep medications down. On review of prev records, had SBO in January and has demonstrated drug seeking behavior in the past.  Past Medical History  Diagnosis Date  . Hypertension   . Diabetes mellitus   . DVT (deep venous thrombosis)   . Chronic back pain   . 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 Hyperlipidemia   . CAD (coronary atherosclerotic disease)     s/p stents x2  . Small bowel obstruction     Past Surgical History  Procedure Date  . Heart stent   . Tonsillectomy and adenoidectomy   . Cholecystectomy   . Back surgery     Family History  Problem Relation Age of Onset  . Diabetes Father     History  Substance Use Topics  . Smoking status: Current Some Day Smoker -- 0.2 packs/day for 30 years    Types: Cigarettes  . Smokeless tobacco: Never Used  . Alcohol Use: No      Review of Systems  Constitutional: Negative for fever and chills.  Respiratory: Negative for shortness of breath.   Cardiovascular: Negative for chest pain, palpitations and leg swelling.  Gastrointestinal: Positive for nausea, vomiting, abdominal pain and diarrhea. Negative for blood in stool.  Musculoskeletal: Positive for back pain.  Skin: Negative for rash and wound.  Neurological: Negative for weakness, numbness and headaches.    Allergies  Lisinopril and Morphine and related  Home Medications   Current Outpatient Rx  Name Route Sig Dispense Refill  . ALPRAZOLAM 1 MG PO TABS Oral Take 1 mg by mouth 2 (two) times daily.    .  ASPIRIN EC 81 MG PO TBEC Oral Take 81 mg by mouth daily.    Marland Kitchen GABAPENTIN 400 MG PO CAPS Oral Take 400 mg by mouth 3 (three) times daily.     Marland Kitchen GLIPIZIDE 10 MG PO TABS Oral Take 10 mg by mouth 2 (two) times daily before a meal.      . LOSARTAN POTASSIUM 100 MG PO TABS Oral Take 100 mg by mouth daily.    Marland Kitchen METFORMIN HCL 1000 MG PO TABS Oral Take 1,000 mg by mouth 2 (two) times daily with a meal.      . ONDANSETRON HCL 4 MG PO TABS Oral Take 4 mg by mouth every 6 (six) hours as needed. For nausea    . OXYCODONE HCL 30 MG PO TABS Oral Take 30 mg by mouth every 4 (four) hours as needed. For pain    . RANITIDINE HCL 300 MG PO CAPS Oral Take 300 mg by mouth 2 (two) times daily.      Bernadette Hoit SODIUM 8.6-50 MG PO TABS Oral Take 1 tablet by mouth 2 (two) times daily. 30 tablet 0    BP 148/74  Pulse 102  Temp(Src) 98.4 F (36.9 C) (Oral)  Resp 18  SpO2 96%  Physical Exam  Nursing note and vitals reviewed. Constitutional: He is oriented to person, place, and time. He appears well-developed and well-nourished. No distress.  disheveled   HENT:  Head: Normocephalic and atraumatic.  Mouth/Throat: Oropharynx is clear and moist.  Eyes: EOM are normal. Pupils are equal, round, and reactive to light.  Neck: Normal range of motion. Neck supple.  Cardiovascular: Normal rate and regular rhythm.   Pulmonary/Chest: Effort normal and breath sounds normal. No respiratory distress. He has no wheezes. He has no rales.  Abdominal: Soft. Bowel sounds are normal. He exhibits no mass. There is tenderness (mild generalized ttp w/o focality). There is no rebound and no guarding.  Musculoskeletal: Normal range of motion. He exhibits no edema and no tenderness.  Neurological: He is alert and oriented to person, place, and time.       5/5 motor, sensation intact  Skin: Skin is warm and dry. No rash noted. No erythema.       Scaly and dry skin, esp LE    ED Course  Procedures (including critical  care time)  Labs Reviewed  COMPREHENSIVE METABOLIC PANEL - Abnormal; Notable for the following:    Potassium 3.3 (*)    Glucose, Bld 358 (*)    Total Protein 8.4 (*)    Total Bilirubin 0.2 (*)    All other components within normal limits  URINALYSIS, ROUTINE W REFLEX MICROSCOPIC - Abnormal; Notable for the following:    Color, Urine AMBER (*) BIOCHEMICALS MAY BE AFFECTED BY COLOR   Specific Gravity, Urine 1.039 (*)    pH 8.5 (*)    Glucose, UA 500 (*)    Bilirubin Urine SMALL (*)    Ketones, ur 40 (*)    Protein, ur 100 (*)    Leukocytes, UA TRACE (*)    All other components within normal limits  CBC  DIFFERENTIAL  LIPASE, BLOOD  URINE MICROSCOPIC-ADD ON   Ct Abdomen Pelvis W Contrast  08/22/2011  *RADIOLOGY REPORT*  Clinical Data: Nausea, vomiting, chills, abdominal pain  CT ABDOMEN AND PELVIS WITH CONTRAST  Technique:  Multidetector CT imaging of the abdomen and pelvis was performed following the standard protocol during bolus administration of intravenous contrast.  Contrast: OMNIPAQUE IOHEXOL 300 MG/ML  SOLN  Comparison: 07/13/2011  Findings: Suggestion of patchy air space disease in the lung bases which might represent atelectasis or early infiltrative process.  Surgical absence of the gallbladder.  Mild physiologic bile duct dilatation.  The liver, spleen, pancreas, adrenal glands, stomach, abdominal aorta, and retroperitoneal lymph nodes are otherwise unremarkable.  Poorly defined low attenuation region in the lower pole of the left kidney medially measures 2.4 x 2.2 cm.  This appears to be enlarging since the previous study.  This could represent a complex cyst but a developing mass is not excluded. Recommend further evaluation with ultrasound to confirm cystic nature of this lesion.  Small sub centimeter parenchymal cysts in both kidneys.  The nephrograms are somewhat heterogeneous which might represent evidence of bilateral pyelonephritis.  Clinical correlation is  recommended.  There is mild proximal dilatation of small bowel with distal small bowel decompression.  The transition zone appears to be in the right lower quadrant.  No obstructing masses demonstrated in changes are likely to represent adhesions.  No free fluid or free air in the abdomen.  Stool filled colon is decompressed. Fat containing umbilical hernia area is unchanged.  Pelvis mildly enlarged prostate gland with calcification.  Mild thickening of bladder wall, possibly due to hypertrophy or decompression.  The appendix is normal.  Postoperative changes and degenerative changes in the lower lumbar spine.  No free or loculated  pelvic fluid collections.  No inflammatory changes in the sigmoid colon.  No significant pelvic lymphadenopathy.  IMPRESSION:  1.  Proximal small bowel dilatation with distal decompression suggesting partial obstruction. 2.  Heterogeneous parenchymal nephrograms bilaterally may represent changes of pyelonephritis.  Clinical correlation recommended. 3.  Suggestion of enlarging hypodense lesion in the left kidney lower pole.  Recommend ultrasound to exclude cystic versus solid mass. 4.  Fat containing umbilical hernia.  Original Report Authenticated By: Marlon Pel, M.D.     1. Partial small bowel obstruction   2. Abdominal pain       MDM  Requesting Dilaudid for pain  Discussed with Dr Donell Beers. Will see in consult. Asked to have [facility] admit.   Loren Racer, MD 08/22/11 0600

## 2011-08-22 NOTE — ED Notes (Signed)
Patient states that he cont. To have pain.  MD made aware.  No new orders received.

## 2011-08-22 NOTE — Progress Notes (Signed)
   CARE MANAGEMENT NOTE 08/22/2011  Patient:  Ryan [name],Ryan [name]   Account Number:  [id number]  Date Initiated:  08/22/2011  Documentation initiated by:  Letha Cape  Subjective/Objective Assessment:   dx sbo  admit- lives alone. uses rw at home.     Action/Plan:   Anticipated DC Date:  08/26/2011   Anticipated DC Plan:  HOME W HOME HEALTH SERVICES      DC Planning Services  CM consult      Choice offered to / List presented to:             Status of service:  In process, will continue to follow Medicare Important Message given?   (If response is "NO", the following Medicare IM given date fields will be blank) Date Medicare IM given:   Date Additional Medicare IM given:    Discharge Disposition:    Per UR Regulation:    If discussed at Long Length of Stay Meetings, dates discussed:    Comments:  08/22/11 17:15 Letha Cape RN, BSN [phone] patient lives alone, has medicatin coverage and transportation.  Patient may need pt eval.  NCM will continue to follow for dc needs.

## 2011-08-22 NOTE — ED Notes (Signed)
MD aware of patients continued pain, no new orders received.

## 2011-08-22 NOTE — Consult Note (Signed)
Reason for Consult: Abdominal pain. Referring Physician: [name] is an 61 y.o. male.  HPI:  Pt is a 61 year old male with chronic abdominal pain [name].  He was admitted last month for pancreatitis.  He was admitted in January for possible small bowel obstruction.  He comes in this time for 2-3 days of crampy abdominal pain in the right abdomen.  He has had some nausea and vomiting and has not been able to keep anything down.  He states that his last flatus was around "15 minutes ago," and his last bowel movement was around "1.5 hours ago."    Past Medical History  Diagnosis Date  . Hypertension   . Diabetes mellitus   . DVT (deep venous thrombosis)   . Chronic back pain   . 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 Hyperlipidemia   . CAD (coronary atherosclerotic disease)     s/p stents x2  . Small bowel obstruction     Past Surgical History  Procedure Date  . Heart stent   . Tonsillectomy and adenoidectomy   . Cholecystectomy   . Back surgery     Family History  Problem Relation Age of Onset  . Diabetes Father     Social History:  reports that he has been smoking Cigarettes.  He has a 7.5 pack-year smoking history. He has never used smokeless tobacco. He reports that he does not drink alcohol or use illicit drugs.  Allergies:  Allergies  Allergen Reactions  . Lisinopril Swelling and Rash  . Morphine And Related Swelling and Rash    Medications:  MedicationsLong-Term  Prescriptions Show Facility-Administered Medications    ALPRAZolam (XANAX) 1 MG tablet   aspirin EC 81 MG tablet   gabapentin (NEURONTIN) 400 MG capsule   glipiZIDE (GLUCOTROL) 10 MG tablet   losartan (COZAAR) 100 MG tablet   metFORMIN (GLUCOPHAGE) 1000 MG tablet   ondansetron (ZOFRAN) 4 MG tablet   oxycodone (ROXICODONE) 30 MG immediate release tablet   ranitidine (ZANTAC) 300 MG capsule   senna-docusate (SENOKOT-S) 8.6-50  MG per tablet     Results for orders placed during the hospital encounter of 08/22/11 (from the past 48 hour(s))  CBC     Status: Normal   Collection Time   08/22/11  2:14 AM      Component Value Range Comment   WBC 5.1  4.0 - 10.5 (K/uL)    RBC 4.89  4.22 - 5.81 (MIL/uL)    Hemoglobin 15.2  13.0 - 17.0 (g/dL)    HCT 45.4  09.8 - 11.9 (%)    MCV 94.7  78.0 - 100.0 (fL)    MCH 31.1  26.0 - 34.0 (pg)    MCHC 32.8  30.0 - 36.0 (g/dL)    RDW 14.7  82.9 - 56.2 (%)    Platelets 330  150 - 400 (K/uL)   DIFFERENTIAL     Status: Normal   Collection Time   08/22/11  2:14 AM      Component Value Range Comment   Neutrophils Relative 60  43 - 77 (%)    Neutro Abs 3.1  1.7 - 7.7 (K/uL)    Lymphocytes Relative 30  12 - 46 (%)    Lymphs Abs 1.5  0.7 - 4.0 (K/uL)    Monocytes Relative 10  3 - 12 (%)    Monocytes Absolute 0.5  0.1 - 1.0 (K/uL)  Eosinophils Relative 0  0 - 5 (%)    Eosinophils Absolute 0.0  0.0 - 0.7 (K/uL)    Basophils Relative 1  0 - 1 (%)    Basophils Absolute 0.0  0.0 - 0.1 (K/uL)   COMPREHENSIVE METABOLIC PANEL     Status: Abnormal   Collection Time   08/22/11  2:14 AM      Component Value Range Comment   Sodium 142  135 - 145 (mEq/L)    Potassium 3.3 (*) 3.5 - 5.1 (mEq/L)    Chloride 98  96 - 112 (mEq/L)    CO2 30  19 - 32 (mEq/L)    Glucose, Bld 358 (*) 70 - 99 (mg/dL)    BUN 11  6 - 23 (mg/dL)    Creatinine, Ser 1.61  0.50 - 1.35 (mg/dL)    Calcium 09.6  8.4 - 10.5 (mg/dL)    Total Protein 8.4 (*) 6.0 - 8.3 (g/dL)    Albumin 4.0  3.5 - 5.2 (g/dL)    AST 11  0 - 37 (U/L)    ALT 10  0 - 53 (U/L)    Alkaline Phosphatase 100  39 - 117 (U/L)    Total Bilirubin 0.2 (*) 0.3 - 1.2 (mg/dL)    GFR calc non Af Amer >90  >90 (mL/min)    GFR calc Af Amer >90  >90 (mL/min)   LIPASE, BLOOD     Status: Normal   Collection Time   08/22/11  2:14 AM      Component Value Range Comment   Lipase 29  11 - 59 (U/L)   URINALYSIS, ROUTINE W REFLEX MICROSCOPIC     Status: Abnormal     Collection Time   08/22/11  4:13 AM      Component Value Range Comment   Color, Urine AMBER (*) YELLOW  BIOCHEMICALS MAY BE AFFECTED BY COLOR   APPearance CLEAR  CLEAR     Specific Gravity, Urine 1.039 (*) 1.005 - 1.030     pH 8.5 (*) 5.0 - 8.0     Glucose, UA 500 (*) NEGATIVE (mg/dL)    Hgb urine dipstick NEGATIVE  NEGATIVE     Bilirubin Urine SMALL (*) NEGATIVE     Ketones, ur 40 (*) NEGATIVE (mg/dL)    Protein, ur 045 (*) NEGATIVE (mg/dL)    Urobilinogen, UA 0.2  0.0 - 1.0 (mg/dL)    Nitrite NEGATIVE  NEGATIVE     Leukocytes, UA TRACE (*) NEGATIVE    URINE MICROSCOPIC-ADD ON     Status: Normal   Collection Time   08/22/11  4:13 AM      Component Value Range Comment   Squamous Epithelial / LPF RARE  RARE     WBC, UA 11-20  <3 (WBC/hpf)    RBC / HPF 0-2  <3 (RBC/hpf)    Bacteria, UA RARE  RARE     Urine-Other MUCOUS PRESENT       Ct Abdomen Pelvis W Contrast  08/22/2011  *RADIOLOGY REPORT*  Clinical Data: Nausea, vomiting, chills, abdominal pain  CT ABDOMEN AND PELVIS WITH CONTRAST  Technique:  Multidetector CT imaging of the abdomen and pelvis was performed following the standard protocol during bolus administration of intravenous contrast.  Contrast: OMNIPAQUE IOHEXOL 300 MG/ML  SOLN  Comparison: 07/13/2011  Findings: Suggestion of patchy air space disease in the lung bases which might represent atelectasis or early infiltrative process.  Surgical absence of the gallbladder.  Mild physiologic bile duct dilatation.  The liver, spleen, pancreas, adrenal glands, stomach, abdominal aorta, and retroperitoneal lymph nodes are otherwise unremarkable.  Poorly defined low attenuation region in the lower pole of the left kidney medially measures 2.4 x 2.2 cm.  This appears to be enlarging since the previous study.  This could represent a complex cyst but a developing mass is not excluded. Recommend further evaluation with ultrasound to confirm cystic nature of this lesion.  Small sub  centimeter parenchymal cysts in both kidneys.  The nephrograms are somewhat heterogeneous which might represent evidence of bilateral pyelonephritis.  Clinical correlation is recommended.  There is mild proximal dilatation of small bowel with distal small bowel decompression.  The transition zone appears to be in the right lower quadrant.  No obstructing masses demonstrated in changes are likely to represent adhesions.  No free fluid or free air in the abdomen.  Stool filled colon is decompressed. Fat containing umbilical hernia area is unchanged.  Pelvis mildly enlarged prostate gland with calcification.  Mild thickening of bladder wall, possibly due to hypertrophy or decompression.  The appendix is normal.  Postoperative changes and degenerative changes in the lower lumbar spine.  No free or loculated pelvic fluid collections.  No inflammatory changes in the sigmoid colon.  No significant pelvic lymphadenopathy.  IMPRESSION:  1.  Proximal small bowel dilatation with distal decompression suggesting partial obstruction. 2.  Heterogeneous parenchymal nephrograms bilaterally may represent changes of pyelonephritis.  Clinical correlation recommended. 3.  Suggestion of enlarging hypodense lesion in the left kidney lower pole.  Recommend ultrasound to exclude cystic versus solid mass. 4.  Fat containing umbilical hernia.  Original Report Authenticated By: Marlon Pel, M.D.    Review of Systems  Constitutional: Negative.   HENT: Negative.   Eyes: Negative.   Respiratory: Negative.   Cardiovascular: Negative.   Gastrointestinal: Positive for nausea, vomiting and abdominal pain. Negative for diarrhea and constipation.  Genitourinary: Negative.   Musculoskeletal: Negative.   Skin: Positive for rash (dry skin especially bad on feet).  Neurological: Negative.   Endo/Heme/Allergies: Negative.   Psychiatric/Behavioral: Negative.    Blood pressure 152/80, pulse 90, temperature 98.4 F (36.9 C),  temperature source Oral, resp. rate 18, SpO2 96.00%. Physical Exam  Constitutional: He is oriented to person, place, and time. He appears well-developed and well-nourished. No distress.  HENT:  Head: Normocephalic and atraumatic.  Right Ear: External ear normal.  Left Ear: External ear normal.       Bad dental hygiene   Eyes: Conjunctivae are normal. Pupils are equal, round, and reactive to light. No scleral icterus.  Neck: Normal range of motion. Neck supple. No tracheal deviation present. No thyromegaly present.  Cardiovascular: Normal rate, regular rhythm, normal heart sounds and intact distal pulses.  Exam reveals no gallop and no friction rub.   No murmur heard. Respiratory: Effort normal and breath sounds normal. No respiratory distress. He has no wheezes. He has no rales. He exhibits no tenderness.  GI: Soft. Bowel sounds are normal. He exhibits no distension and no mass. There is no tenderness. There is no rebound and no guarding.  Musculoskeletal: Normal range of motion. He exhibits no edema and no tenderness.  Lymphadenopathy:    He has no cervical adenopathy.  Neurological: He is alert and oriented to person, place, and time. Coordination normal.  Skin: Skin is warm and dry. He is not diaphoretic. No erythema. No pallor.  Psychiatric: He has a normal mood and affect. His behavior is normal. Judgment and thought content normal.  Assessment/Plan: Ileus vs SBO Proximal small bowel minimally dilated on CT.  Xrays negative for obstruction.  Would keep NPO, but do not think NGT is necessary at this time.   Constipation and adynamic ileus is likely culprit given level of narcotic use.   Would give SMOG enemas and try to get cleaned out from below. General Surgery ([facility]) team to follow No role for surgical intervention at this time.    Marchele Decock 08/22/2011, 7:19 AM

## 2011-08-23 ENCOUNTER — Inpatient Hospital Stay (HOSPITAL_COMMUNITY): Payer: Medicare Other

## 2011-08-23 LAB — COMPREHENSIVE METABOLIC PANEL
AST: 9 U/L (ref 0–37)
Albumin: 2.9 g/dL — ABNORMAL LOW (ref 3.5–5.2)
Chloride: 102 mEq/L (ref 96–112)
Creatinine, Ser: 0.66 mg/dL (ref 0.50–1.35)
Potassium: 3.7 mEq/L (ref 3.5–5.1)
Sodium: 139 mEq/L (ref 135–145)
Total Bilirubin: 0.3 mg/dL (ref 0.3–1.2)

## 2011-08-23 LAB — CBC
MCH: 30.1 pg (ref 26.0–34.0)
MCV: 96.1 fL (ref 78.0–100.0)
Platelets: 263 10*3/uL (ref 150–400)
RBC: 4.35 MIL/uL (ref 4.22–5.81)

## 2011-08-23 LAB — GLUCOSE, CAPILLARY
Glucose-Capillary: 228 mg/dL — ABNORMAL HIGH (ref 70–99)
Glucose-Capillary: 282 mg/dL — ABNORMAL HIGH (ref 70–99)
Glucose-Capillary: 263 mg/dL — ABNORMAL HIGH (ref 70–99)
Glucose-Capillary: 236 mg/dL — ABNORMAL HIGH (ref 70–99)

## 2011-08-23 MED ORDER — PANTOPRAZOLE SODIUM 40 MG PO TBEC: 40.0000 mg | DELAYED_RELEASE_TABLET | Freq: Every day | ORAL | Status: DC

## 2011-08-23 MED ORDER — INSULIN ASPART 100 UNIT/ML ~~LOC~~ SOLN
0.0000 [IU] | Freq: Three times a day (TID) | SUBCUTANEOUS | Status: DC
  Administered 2011-08-23: 11 [IU] via SUBCUTANEOUS
  Administered 2011-08-23 – 2011-08-24 (×2): 7 [IU] via SUBCUTANEOUS

## 2011-08-23 MED ORDER — "BD GETTING STARTED TAKE HOME KIT: 1ML X 30 G SYRINGES, "
1.0000 | Freq: Once | Status: DC
  Filled 2011-08-23: qty 1

## 2011-08-23 MED ORDER — BD GETTING STARTED TAKE HOME KIT: 1/2ML X 30G SYRINGES
1.0000 | Freq: Once | Status: AC
  Administered 2011-08-23: 1
  Filled 2011-08-23: qty 1

## 2011-08-23 MED ORDER — ENOXAPARIN SODIUM 40 MG/0.4ML ~~LOC~~ SOLN
40.0000 mg | SUBCUTANEOUS | Status: DC
  Administered 2011-08-23: 40 mg via SUBCUTANEOUS
  Filled 2011-08-23 (×2): qty 0.4

## 2011-08-23 MED ORDER — MAGNESIUM CITRATE PO SOLN
1.0000 | Freq: Once | ORAL | Status: DC
  Filled 2011-08-23: qty 296

## 2011-08-23 MED ORDER — INSULIN GLARGINE 100 UNIT/ML ~~LOC~~ SOLN: 15.0000 [IU] | Freq: Every day | SUBCUTANEOUS | Status: DC

## 2011-08-23 MED ORDER — OXYCODONE HCL 5 MG PO TABS
5.0000 mg | ORAL_TABLET | Freq: Once | ORAL | Status: AC
  Administered 2011-08-24: 5 mg via ORAL
  Filled 2011-08-23: qty 1

## 2011-08-23 MED ORDER — POLYETHYLENE GLYCOL 3350 17 G PO PACK
17.0000 g | PACK | Freq: Two times a day (BID) | ORAL | Status: DC
  Administered 2011-08-23: 17 g via ORAL
  Filled 2011-08-23 (×3): qty 1

## 2011-08-23 MED ORDER — OXYCODONE HCL 5 MG PO TABS
30.0000 mg | ORAL_TABLET | ORAL | Status: DC | PRN
  Administered 2011-08-23 – 2011-08-24 (×5): 30 mg via ORAL
  Filled 2011-08-23 (×6): qty 6

## 2011-08-23 MED ORDER — MAGNESIUM CITRATE PO SOLN
1.0000 | Freq: Once | ORAL | Status: AC
  Administered 2011-08-23: 1 via ORAL
  Filled 2011-08-23: qty 296

## 2011-08-23 NOTE — Progress Notes (Signed)
Patient ID: MARINA DESIRE, male   DOB: 06/02/1950, 61 y.o.   MRN: [id number] PATIENT DETAILS Name: Ryan [name] Age: 61 y.o. Sex: male Date of Birth: 26-Oct-1950 Admit Date: 08/22/2011 PCP: Lucilla Edin, MD, MD  Interim History:  Patient had 3 small soft BM last night after SMOG enema.  No BM yet today.  Still complaining of severe abd pain.  Tolerating full liquid diet.   Subjective: Complaining of abdominal pain.  "I've only been here one day, usually I stay in the hospital 2 days.  I'll be ready to go tomorrow"  Objective: Weight change:   Intake/Output Summary (Last 24 hours) at 08/23/11 1520 Last data filed at 08/22/11 2129  Gross per 24 hour  Intake    600 ml  Output    300 ml  Net    300 ml   Blood pressure 140/80, pulse 80, temperature 98 F (36.7 C), temperature source Oral, resp. rate 20, height 6\' 5"  (1.956 m), weight 140.2 kg (309 lb 1.4 oz), SpO2 98.00%. Filed Vitals:   08/22/11 2026 08/23/11 0447 08/23/11 0945 08/23/11 1300  BP: 132/77 145/80 133/79 140/80  Pulse: 76 70 75 80  Temp: 98.2 F (36.8 C) 97.8 F (36.6 C) 98 F (36.7 C) 98 F (36.7 C)  TempSrc:  Oral Oral Oral  Resp: 18 17 18 20   Height:      Weight:      SpO2: 95% 93% 96% 98%    Physical Exam: General: No acute distress Lungs: Clear to auscultation bilaterally without wheezes or crackles Cardiovascular: Regular rate and rhythm without murmur gallop or rub normal S1 and S2 (Difficult to hear) Abdomen: Obese, Nontender, nondistended, soft, bowel sounds positive, no rebound, no ascites, no appreciable mass Extremities: No significant cyanosis, clubbing, or edema bilateral lower extremities  Basic Metabolic Panel:  Lab 08/23/11 4098 08/22/11 0214  NA 139 142  K 3.7 3.3*  CL 102 98  CO2 29 30  GLUCOSE 239* 358*  BUN 9 11  CREATININE 0.66 0.78  CALCIUM 9.0 10.5  MG -- --  PHOS -- --   Liver Function Tests:  Lab 08/23/11 0535 08/22/11 0214  AST 9 11  ALT 9 10  ALKPHOS 79 100   BILITOT 0.3 0.2*  PROT 6.8 8.4*  ALBUMIN 2.9* 4.0    Lab 08/22/11 0214  LIPASE 29  AMYLASE --   CBC:  Lab 08/23/11 0535 08/22/11 0214  WBC 6.6 5.1  NEUTROABS -- 3.1  HGB 13.1 15.2  HCT 41.8 46.3  MCV 96.1 94.7  PLT 263 330   CBG:  Lab 08/23/11 1220 08/23/11 0756 08/22/11 2127 08/22/11 1704 08/22/11 0653  GLUCAP 282* 228* 328* 383* 250*   Urine Drug Screen: Drugs of Abuse     Component Value Date/Time   LABOPIA NEGATIVE 11/13/2010 2022   LABOPIA POSITIVE* 04/27/2010 1102   COCAINSCRNUR NEGATIVE 11/13/2010 2022   COCAINSCRNUR POSITIVE* 04/27/2010 1102   LABBENZ POSITIVE* 11/13/2010 2022   LABBENZ POSITIVE* 04/27/2010 1102   AMPHETMU NEGATIVE 11/13/2010 2022   AMPHETMU NONE DETECTED 04/27/2010 1102   THCU NONE DETECTED 04/27/2010 1102   LABBARB  Value: NONE DETECTED        DRUG SCREEN FOR MEDICAL PURPOSES ONLY.  IF CONFIRMATION IS NEEDED FOR ANY PURPOSE, NOTIFY LAB WITHIN 5 DAYS.        LOWEST DETECTABLE LIMITS FOR URINE DRUG SCREEN Drug Class       Cutoff (ng/mL) Amphetamine      1000 Barbiturate  200 Benzodiazepine   200 Tricyclics       300 Opiates          300 Cocaine          300 THC              50 04/27/2010 1102     Micro Results: Recent Results (from the past 240 hour(s))  MRSA PCR SCREENING     Status: Normal   Collection Time   08/22/11  9:04 AM      Component Value Range Status Comment   MRSA by PCR NEGATIVE  NEGATIVE  Final     Studies/Results: Scheduled Meds:   . ALPRAZolam  1 mg Oral BID  . aspirin EC  81 mg Oral Daily  . bisacodyl  10 mg Rectal Daily  . famotidine  20 mg Oral Daily  . gabapentin  400 mg Oral TID  . glipiZIDE  10 mg Oral BID AC  . insulin aspart  0-9 Units Subcutaneous TID WC  . insulin glargine  10 Units Subcutaneous Daily  . lip balm  1 application Topical BID  . losartan  100 mg Oral Daily  . magnesium citrate  1 Bottle Oral Once  . magnesium citrate  1 Bottle Oral Once  . nicotine  14 mg Transdermal Daily  .  pantoprazole  40 mg Oral Q1200  . polyethylene glycol  17 g Oral BID  . saccharomyces boulardii  250 mg Oral BID  . senna-docusate  1 tablet Oral BID  . sodium chloride  3 mL Intravenous Q12H  . sorbitol, milk of mag, mineral oil, glycerin (SMOG) enema  960 mL Rectal Once  . DISCONTD: pantoprazole (PROTONIX) IV  40 mg Intravenous Q1200   Continuous Infusions:   . 0.9 % NaCl with KCl 20 mEq / L 50 mL/hr at 08/22/11 1258   PRN Meds:.albuterol, ipratropium, ondansetron (ZOFRAN) IV, ondansetron, oxyCODONE, sorbitol, milk of mag, mineral oil, glycerin (SMOG) enema, DISCONTD: HYDROmorphone  Anti-infectives:  Anti-infectives    None      Assessment/Plan: Principal Problem:  *SBO (small bowel obstruction) Active Problems:  DIABETES MELLITUS, TYPE II  HYPERTENSION  CORONARY ARTERY DISEASE  LOW BACK PAIN  PANCREATITIS, HX OF  Diabetes mellitus  History of DVT (deep vein thrombosis)    Present on Admission:  . partial SBO (small bowel obstruction)  Improving with SMOG enema.  Will continue with Dose of Mag Citrate, Miralax, and Senna.  Advance diet as tolerated.  Patient declares that he understands that narcotics can cause constipation, but requests pain medications q 4 hours.    Marland KitchenDIABETES MELLITUS, TYPE II-uncontrolled  CBGs 280s - 300s inpatient.  Will increase lantus to 15 and change sliding scale to resistant as an inpatient.  Patient on oral medications outpatient.  Likely needs lantus/novolog at discharge. D/C glipizide and metformin.    Hgb A1c was 11 in March 2013.  Will request insulin teaching and outpatient diabetic education.  .CORONARY ARTERY DISEASE Stable on 81 mg ASA  .HYPERTENSION Currently controlled.  .LOW BACK PAIN  Not complaining of Back pain currently.  Stable resume home medications. Ambulate in hallways. Care for possible drug-seeking behavior  DVT Prophylaxis - lovenox   LOS: 1 day   Stephani Police 08/23/2011, 3:20  PM [phone]   Attending -I have seen and examined the patient, he is much better. Currently advancing diet, if better, will d/c home in am  Dr Windell Norfolk

## 2011-08-23 NOTE — Progress Notes (Signed)
Utilization Review Completed.Ryan [name] T4/26/2013   

## 2011-08-23 NOTE — Progress Notes (Signed)
Inpatient Diabetes Program Recommendations  AACE/ADA: New Consensus Statement on Inpatient Glycemic Control (2009)  Target Ranges:  Prepandial:   less than 140 mg/dL      Peak postprandial:   less than 180 mg/dL (1-2 hours)      Critically ill patients:  140 - 180 mg/dL    Inpatient Diabetes Program Recommendations Insulin - Basal: Increase Lantus to 20 units  Correction (SSI): Increase to MODERATE scale TID+ HS scale   MD, will this patient go home on insulin?  Please address.  A1C=11.6  Thank you  Piedad Climes [facility] Inpatient Diabetes Coordinator [phone]

## 2011-08-23 NOTE — Progress Notes (Signed)
Pt reported large bowel movement, pellet sized in shape. Pt reports that he has been on insulin before. Will continue insulin teaching about diet.

## 2011-08-23 NOTE — Progress Notes (Signed)
Performed enema and only result was gas. Will notify MD.

## 2011-08-23 NOTE — Progress Notes (Signed)
Patient ID: Ryan [name], male   DOB: 21-Oct-1950, 61 y.o.   MRN: [id number]    Subjective: Pt c/o mild abd "achyness"  Otherwise he had 3 BMs yesterday.  Objective: Vital signs in last 24 hours: Temp:  [97.8 F (36.6 C)-98.2 F (36.8 C)] 97.8 F (36.6 C) (04/26 0447) Pulse Rate:  [70-76] 70  (04/26 0447) Resp:  [17-18] 17  (04/26 0447) BP: (132-165)/(68-80) 145/80 mmHg (04/26 0447) SpO2:  [93 %-95 %] 93 % (04/26 0447) Last BM Date: 08/22/11  Intake/Output from previous day: 04/25 0701 - 04/26 0700 In: 960 [P.O.:960] Out: 300 [Urine:300] Intake/Output this shift:    PE: Abd: soft, NT, ND, +BS  Lab Results:   Basename 08/23/11 0535 08/22/11 0214  WBC 6.6 5.1  HGB 13.1 15.2  HCT 41.8 46.3  PLT 263 330   BMET  Basename 08/23/11 0535 08/22/11 0214  NA 139 142  K 3.7 3.3*  CL 102 98  CO2 29 30  GLUCOSE 239* 358*  BUN 9 11  CREATININE 0.66 0.78  CALCIUM 9.0 10.5   PT/INR No results found for this basename: LABPROT:2,INR:2 in the last 72 hours CMP     Component Value Date/Time   NA 139 08/23/2011 0535   K 3.7 08/23/2011 0535   CL 102 08/23/2011 0535   CO2 29 08/23/2011 0535   GLUCOSE 239* 08/23/2011 0535   BUN 9 08/23/2011 0535   CREATININE 0.66 08/23/2011 0535   CALCIUM 9.0 08/23/2011 0535   PROT 6.8 08/23/2011 0535   ALBUMIN 2.9* 08/23/2011 0535   AST 9 08/23/2011 0535   ALT 9 08/23/2011 0535   ALKPHOS 79 08/23/2011 0535   BILITOT 0.3 08/23/2011 0535   GFRNONAA >90 08/23/2011 0535   GFRAA >90 08/23/2011 0535   Lipase     Component Value Date/Time   LIPASE 29 08/22/2011 0214       Studies/Results: Dg Abd 1 View  08/23/2011  *RADIOLOGY REPORT*  Clinical Data: Abdominal pain.  Nausea and vomiting.  Constipation.  ABDOMEN - 1 VIEW  Comparison: 07/17/2011  Findings: No evidence of dilated bowel loops.  A small amount of residual contrast is seen in the left colon. Significant decrease in colonic stool is noted compared to prior exam.  No radiopaque calculi  identified.  The right upper quadrant surgical clips seen from prior cholecystectomy.  Previous lower lumbar spine fusion hardware again noted.  IMPRESSION: Decreased colonic stool since prior exam.  No acute findings.  Original Report Authenticated By: Danae Orleans, M.D.   Ct Abdomen Pelvis W Contrast  08/22/2011  *RADIOLOGY REPORT*  Clinical Data: Nausea, vomiting, chills, abdominal pain  CT ABDOMEN AND PELVIS WITH CONTRAST  Technique:  Multidetector CT imaging of the abdomen and pelvis was performed following the standard protocol during bolus administration of intravenous contrast.  Contrast: OMNIPAQUE IOHEXOL 300 MG/ML  SOLN  Comparison: 07/13/2011  Findings: Suggestion of patchy air space disease in the lung bases which might represent atelectasis or early infiltrative process.  Surgical absence of the gallbladder.  Mild physiologic bile duct dilatation.  The liver, spleen, pancreas, adrenal glands, stomach, abdominal aorta, and retroperitoneal lymph nodes are otherwise unremarkable.  Poorly defined low attenuation region in the lower pole of the left kidney medially measures 2.4 x 2.2 cm.  This appears to be enlarging since the previous study.  This could represent a complex cyst but a developing mass is not excluded. Recommend further evaluation with ultrasound to confirm cystic nature of this lesion.  Small sub centimeter parenchymal cysts in both kidneys.  The nephrograms are somewhat heterogeneous which might represent evidence of bilateral pyelonephritis.  Clinical correlation is recommended.  There is mild proximal dilatation of small bowel with distal small bowel decompression.  The transition zone appears to be in the right lower quadrant.  No obstructing masses demonstrated in changes are likely to represent adhesions.  No free fluid or free air in the abdomen.  Stool filled colon is decompressed. Fat containing umbilical hernia area is unchanged.  Pelvis mildly enlarged prostate gland with  calcification.  Mild thickening of bladder wall, possibly due to hypertrophy or decompression.  The appendix is normal.  Postoperative changes and degenerative changes in the lower lumbar spine.  No free or loculated pelvic fluid collections.  No inflammatory changes in the sigmoid colon.  No significant pelvic lymphadenopathy.  IMPRESSION:  1.  Proximal small bowel dilatation with distal decompression suggesting partial obstruction. 2.  Heterogeneous parenchymal nephrograms bilaterally may represent changes of pyelonephritis.  Clinical correlation recommended. 3.  Suggestion of enlarging hypodense lesion in the left kidney lower pole.  Recommend ultrasound to exclude cystic versus solid mass. 4.  Fat containing umbilical hernia.  Original Report Authenticated By: Marlon Pel, M.D.   US Renal  08/22/2011  *RADIOLOGY REPORT*  Clinical Data:  Evaluate mass identified on the sonogram.  RENAL/URINARY TRACT ULTRASOUND COMPLETE  Comparison:  None.  Findings:  Right Kidney:  Normal in size and parenchymal echogenicity.  No evidence of mass or hydronephrosis.  Left Kidney:  Normal in size and parenchymal echogenicity.  No evidence of mass or hydronephrosis.  Bladder:  Appears normal for degree of bladder distention.  IMPRESSION:  1.  No mass identified. Cyst identified on recent CT remains indeterminant. Recommend 13-month follow-up examination.  At this time, the study of choice would be a contrast-enhanced MRI.  Original Report Authenticated By: Rosealee Albee, M.D.    Anti-infectives: Anti-infectives    None       Assessment/Plan  1. Chronic abdominal pain 2. PSBO, resolved  Plan: 1. The abdominal x-rays today are normal.  He had 3 BMs yesterday.  His diet can be advanced as tolerates.  There are no surgical indications for this patient.  Please call as needed.   LOS: 1 day    OSBORNE,KELLY E 08/23/2011  -pt needs regular bowel regimen to avoid constipation/ileus - Miralax BID at least  (1/2 - 6 "doses" a day) -getting off narcotics (or at least tapering) will help prevent recurrence

## 2011-08-24 MED ORDER — INSULIN GLARGINE 100 UNIT/ML ~~LOC~~ SOLN: 15.0000 [IU] | Freq: Every day | SUBCUTANEOUS | Status: DC

## 2011-08-24 MED ORDER — POLYETHYLENE GLYCOL 3350 17 G PO PACK: 17.0000 g | PACK | Freq: Two times a day (BID) | ORAL | Status: AC

## 2011-08-24 MED ORDER — INSULIN ASPART 100 UNIT/ML ~~LOC~~ SOLN: 0.0000 [IU] | Freq: Three times a day (TID) | SUBCUTANEOUS | Status: DC

## 2011-08-24 MED ORDER — METFORMIN HCL 1000 MG PO TABS: 500.0000 mg | ORAL_TABLET | Freq: Two times a day (BID) | ORAL | Status: DC

## 2011-08-24 MED ORDER — POLYETHYLENE GLYCOL 3350 17 G PO PACK: 17.0000 g | PACK | Freq: Two times a day (BID) | ORAL | Status: DC

## 2011-08-24 MED ORDER — OXYCODONE HCL 30 MG PO TABS: 30.0000 mg | ORAL_TABLET | Freq: Four times a day (QID) | ORAL | Status: DC | PRN

## 2011-08-24 MED ORDER — ALPRAZOLAM 1 MG PO TABS: 1.0000 mg | ORAL_TABLET | Freq: Two times a day (BID) | ORAL | Status: DC

## 2011-08-24 MED ORDER — GLIPIZIDE 5 MG PO TABS: 5.0000 mg | ORAL_TABLET | Freq: Two times a day (BID) | ORAL | Status: DC

## 2011-08-24 MED ORDER — GABAPENTIN 400 MG PO CAPS: 400.0000 mg | ORAL_CAPSULE | Freq: Three times a day (TID) | ORAL | Status: DC

## 2011-08-24 NOTE — Progress Notes (Signed)
08/24/2011 10:47 AM Nursing note Discharge avs form, medications already taken today and those due this evening discussed with patient. Patient gave return demonstration of correctly drawing up insulin dose for this evening. Insulin starter kit given to patient. Correct use and importance of checking blood sugar explained. Sources for obtaining cbg meter and strips also discussed. Patient has private insurance and [facility] patient. IV site already removed from this morning. Pt. Requested bus pass due to recent pain medication administration. Plans to have friend pick up car at later date. Pt d/c home per orders to bus stop. Cigarettes and lighter also returned to patient. Questions and concerns addressed.  Charnee Turnipseed, Blanchard Kelch

## 2011-08-24 NOTE — Progress Notes (Signed)
   CARE MANAGEMENT NOTE 08/24/2011  Patient:  Ryan [name],Ryan [name]   Account Number:  [id number]  Date Initiated:  08/22/2011  Documentation initiated by:  Letha Cape  Subjective/Objective Assessment:   dx sbo  admit- lives alone. uses rw at home.     Action/Plan:   gets meds from [place]   Anticipated DC Date:  08/26/2011   Anticipated DC Plan:  HOME W HOME HEALTH SERVICES      DC Planning Services  CM consult  Medication Assistance      Choice offered to / List presented to:             Status of service:  Completed, signed off Medicare Important Message given?   (If response is "NO", the following Medicare IM given date fields will be blank) Date Medicare IM given:   Date Additional Medicare IM given:    Discharge Disposition:  HOME/SELF CARE  Per UR Regulation:    If discussed at Long Length of Stay Meetings, dates discussed:    Comments:  08/24/2011 0920 Spoke to pt and states he recently started receiving his disability and has new insurance coverage. He is not sure how much his new insurance will cover for his medications. He also can get his medications from [place]. [facility]. Explained to pt he can purchase his glucometer from pharmacy and his insurance may cover his testing strips. Isidoro Donning RN CCM Case Mgmt phone [phone]  08/22/11 17:15 Letha Cape RN, BSN [phone] patient lives alone, has medicatin coverage and transportation.  Patient may need pt eval.  NCM will continue to follow for dc needs.

## 2011-08-24 NOTE — Discharge Summary (Signed)
PATIENT DETAILS Name: Ryan [name] Age: 61 y.o. Sex: male Date of Birth: 03-10-1951 MRN: [id number]. Admit Date: 08/22/2011 Admitting Physician: Gery Pray, MD [id number], Stan Head, MD, MD  PRIMARY DISCHARGE DIAGNOSIS:  Principal Problem:  *SBO (small bowel obstruction) Active Problems:  DIABETES MELLITUS, TYPE II  HYPERTENSION  CORONARY ARTERY DISEASE  LOW BACK PAIN  PANCREATITIS, HX OF  Diabetes mellitus  History of DVT (deep vein thrombosis)      PAST MEDICAL HISTORY: Past Medical History  Diagnosis Date  . Hypertension   . Diabetes mellitus   . DVT (deep venous thrombosis)   . Chronic back pain   . 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 Hyperlipidemia   . CAD (coronary atherosclerotic disease)     s/p stents x2  . Small bowel obstruction     DISCHARGE MEDICATIONS: Medication List  As of 08/24/2011  4:27 PM   TAKE these medications         ALPRAZolam 1 MG tablet   Commonly known as: XANAX   Take 1 tablet (1 mg total) by mouth 2 (two) times daily.      aspirin EC 81 MG tablet   Take 81 mg by mouth daily.      gabapentin 400 MG capsule   Commonly known as: NEURONTIN   Take 1 capsule (400 mg total) by mouth 3 (three) times daily.      glipiZIDE 5 MG tablet   Commonly known as: GLUCOTROL   Take 1 tablet (5 mg total) by mouth 2 (two) times daily before a meal.      insulin glargine 100 UNIT/ML injection   Commonly known as: LANTUS   Inject 15 Units into the skin at bedtime.      losartan 100 MG tablet   Commonly known as: COZAAR   Take 100 mg by mouth daily.      metFORMIN 1000 MG tablet   Commonly known as: GLUCOPHAGE   Take 0.5 tablets (500 mg total) by mouth 2 (two) times daily with a meal.      ondansetron 4 MG tablet   Commonly known as: ZOFRAN   Take 4 mg by mouth every 6 (six) hours as needed. For nausea      oxycodone 30 MG immediate release tablet   Commonly known as: ROXICODONE   Take 1 tablet (30 mg total) by  mouth every 6 (six) hours as needed for pain. For pain      polyethylene glycol packet   Commonly known as: MIRALAX / GLYCOLAX   Take 17 g by mouth 2 (two) times daily.      ranitidine 300 MG capsule   Commonly known as: ZANTAC   Take 300 mg by mouth 2 (two) times daily.      senna-docusate 8.6-50 MG per tablet   Commonly known as: Senokot-S   Take 1 tablet by mouth 2 (two) times daily.             BRIEF HPI:  See H&P, Labs, Consult and Test reports for all details in brief, This is a 61 year old male who presents with complaint of abdominal pain, mild nausea, vomiting and diarrhea. He states his symptoms have been present for approximately 2-3 days. It is centrally located. Its radiates to both side and then around the back. He states the pain is constant. He ranks it as 10 out of 10. He states he has had chills and but no fevers. The patient does  have a history of recurrent small bowel obstruction.    CONSULTATIONS:   general surgery  PERTINENT RADIOLOGIC STUDIES: Dg Abd 1 View  08/23/2011  *RADIOLOGY REPORT*  Clinical Data: Abdominal pain.  Nausea and vomiting.  Constipation.  ABDOMEN - 1 VIEW  Comparison: 07/17/2011  Findings: No evidence of dilated bowel loops.  A small amount of residual contrast is seen in the left colon. Significant decrease in colonic stool is noted compared to prior exam.  No radiopaque calculi identified.  The right upper quadrant surgical clips seen from prior cholecystectomy.  Previous lower lumbar spine fusion hardware again noted.  IMPRESSION: Decreased colonic stool since prior exam.  No acute findings.  Original Report Authenticated By: Danae Orleans, M.D.   Ct Abdomen Pelvis W Contrast  08/22/2011  *RADIOLOGY REPORT*  Clinical Data: Nausea, vomiting, chills, abdominal pain  CT ABDOMEN AND PELVIS WITH CONTRAST  Technique:  Multidetector CT imaging of the abdomen and pelvis was performed following the standard protocol during bolus administration of  intravenous contrast.  Contrast: OMNIPAQUE IOHEXOL 300 MG/ML  SOLN  Comparison: 07/13/2011  Findings: Suggestion of patchy air space disease in the lung bases which might represent atelectasis or early infiltrative process.  Surgical absence of the gallbladder.  Mild physiologic bile duct dilatation.  The liver, spleen, pancreas, adrenal glands, stomach, abdominal aorta, and retroperitoneal lymph nodes are otherwise unremarkable.  Poorly defined low attenuation region in the lower pole of the left kidney medially measures 2.4 x 2.2 cm.  This appears to be enlarging since the previous study.  This could represent a complex cyst but a developing mass is not excluded. Recommend further evaluation with ultrasound to confirm cystic nature of this lesion.  Small sub centimeter parenchymal cysts in both kidneys.  The nephrograms are somewhat heterogeneous which might represent evidence of bilateral pyelonephritis.  Clinical correlation is recommended.  There is mild proximal dilatation of small bowel with distal small bowel decompression.  The transition zone appears to be in the right lower quadrant.  No obstructing masses demonstrated in changes are likely to represent adhesions.  No free fluid or free air in the abdomen.  Stool filled colon is decompressed. Fat containing umbilical hernia area is unchanged.  Pelvis mildly enlarged prostate gland with calcification.  Mild thickening of bladder wall, possibly due to hypertrophy or decompression.  The appendix is normal.  Postoperative changes and degenerative changes in the lower lumbar spine.  No free or loculated pelvic fluid collections.  No inflammatory changes in the sigmoid colon.  No significant pelvic lymphadenopathy.  IMPRESSION:  1.  Proximal small bowel dilatation with distal decompression suggesting partial obstruction. 2.  Heterogeneous parenchymal nephrograms bilaterally may represent changes of pyelonephritis.  Clinical correlation recommended. 3.   Suggestion of enlarging hypodense lesion in the left kidney lower pole.  Recommend ultrasound to exclude cystic versus solid mass. 4.  Fat containing umbilical hernia.  Original Report Authenticated By: Marlon Pel, M.D.   US Renal  08/22/2011  *RADIOLOGY REPORT*  Clinical Data:  Evaluate mass identified on the sonogram.  RENAL/URINARY TRACT ULTRASOUND COMPLETE  Comparison:  None.  Findings:  Right Kidney:  Normal in size and parenchymal echogenicity.  No evidence of mass or hydronephrosis.  Left Kidney:  Normal in size and parenchymal echogenicity.  No evidence of mass or hydronephrosis.  Bladder:  Appears normal for degree of bladder distention.  IMPRESSION:  1.  No mass identified. Cyst identified on recent CT remains indeterminant. Recommend 34-month follow-up examination.  At this time, the study of choice would be a contrast-enhanced MRI.  Original Report Authenticated By: Rosealee Albee, M.D.     PERTINENT LAB RESULTS: CBC:  Basename 08/23/11 0535 08/22/11 0214  WBC 6.6 5.1  HGB 13.1 15.2  HCT 41.8 46.3  PLT 263 330   CMET CMP     Component Value Date/Time   NA 139 08/23/2011 0535   K 3.7 08/23/2011 0535   CL 102 08/23/2011 0535   CO2 29 08/23/2011 0535   GLUCOSE 239* 08/23/2011 0535   BUN 9 08/23/2011 0535   CREATININE 0.66 08/23/2011 0535   CALCIUM 9.0 08/23/2011 0535   PROT 6.8 08/23/2011 0535   ALBUMIN 2.9* 08/23/2011 0535   AST 9 08/23/2011 0535   ALT 9 08/23/2011 0535   ALKPHOS 79 08/23/2011 0535   BILITOT 0.3 08/23/2011 0535   GFRNONAA >90 08/23/2011 0535   GFRAA >90 08/23/2011 0535    GFR Estimated Creatinine Clearance: 153.3 ml/min (by C-G formula based on Cr of 0.66).  Basename 08/22/11 0214  LIPASE 29  AMYLASE --   No results found for this basename: CKTOTAL:3,CKMB:3,CKMBINDEX:3,TROPONINI:3 in the last 72 hours No components found with this basename: POCBNP:3 No results found for this basename: DDIMER:2 in the last 72 hours No results found for this basename:  HGBA1C:2 in the last 72 hours No results found for this basename: CHOL:2,HDL:2,LDLCALC:2,TRIG:2,CHOLHDL:2,LDLDIRECT:2 in the last 72 hours No results found for this basename: TSH,T4TOTAL,FREET3,T3FREE,THYROIDAB in the last 72 hours No results found for this basename: VITAMINB12:2,FOLATE:2,FERRITIN:2,TIBC:2,IRON:2,RETICCTPCT:2 in the last 72 hours Coags: No results found for this basename: PT:2,INR:2 in the last 72 hours Microbiology: Recent Results (from the past 240 hour(s))  MRSA PCR SCREENING     Status: Normal   Collection Time   08/22/11  9:04 AM      Component Value Range Status Comment   MRSA by PCR NEGATIVE  NEGATIVE  Final      BRIEF HOSPITAL COURSE:  Principal Problem:  *SBO (small bowel obstruction) -Resolved, likely secondary to chronic narcotic use and subsequent constipation. He was given numerous enemas, diet was slowly advanced, by the day of discharge he was able to tolerate a regular diet. He is having numerous bowel movements as well. He has been advised to stay on stool softeners/laxatives when he is on chronic narcotics. His pain is significantly improved, this patient, who has some amount of abdominal pain leave chronic from his history of having chronic pancreatitis. On examination his abdomen is very soft.  DIABETES MELLITUS, TYPE II- -uncontrolled, A1c done last month 11.6 -Is been started on Lantus, he has been continued on glipizide and metformin.  Rest of the patient's medical issues were stable.   TODAY-DAY OF DISCHARGE:  Subjective:   Mitsuru Dault today has no headache,no chest no new weakness tingling or numbness, he still has some abdominal pain but it is significantly better than on admission. He has tolerated a regular diet and has passed numerous bowel movements. His belly is very soft on exam she without any rebound rigidity or obvious tenderness.  Objective:   Blood pressure 137/73, pulse 73, temperature 98.5 F (36.9 C), temperature source  Oral, resp. rate 18, height 6\' 5"  (1.956 m), weight 142.4 kg (313 lb 15 oz), SpO2 97.00%.  Intake/Output Summary (Last 24 hours) at 08/24/11 1627 Last data filed at 08/24/11 0557  Gross per 24 hour  Intake    600 ml  Output   1100 ml  Net   -500 ml  Exam Awake Alert, Oriented *3, No new F.N deficits, Normal affect [place].AT,PERRAL Supple Neck,No JVD, No cervical lymphadenopathy appriciated.  Symmetrical Chest wall movement, Good air movement bilaterally, CTAB RRR,No Gallops,Rubs or new Murmurs, No Parasternal Heave +ve B.Sounds, Abd Soft, Non tender, No organomegaly appriciated, No rebound -guarding or rigidity. No Cyanosis, Clubbing or edema, No new Rash or bruise  DISPOSITION: home  DISCHARGE INSTRUCTIONS:    Follow-up Information    Follow up with DAUB, STEVE A, MD. (patient claims he has a follow up appointment with PCP Monday-08/26/11)    Contact information:   [address] [place] [address] [phone]         Total Time spent on discharge equals 45 minutes.  SignedJeoffrey Massed 08/24/2011 4:27 PM

## 2011-09-18 ENCOUNTER — Emergency Department (HOSPITAL_COMMUNITY): Payer: Medicare Other

## 2011-09-18 ENCOUNTER — Emergency Department (HOSPITAL_COMMUNITY)
Admission: EM | Admit: 2011-09-18 | Discharge: 2011-09-18 | Disposition: A | Discharge status: Home / Self Care | Payer: Medicare Other | Source: Home / Self Care | Attending: Emergency Medicine | Admitting: Emergency Medicine

## 2011-09-18 ENCOUNTER — Encounter (HOSPITAL_COMMUNITY): Payer: Self-pay | Admitting: *Deleted

## 2011-09-18 DIAGNOSIS — I2510 Atherosclerotic heart disease of native coronary artery without angina pectoris: Secondary | ICD-10-CM | POA: Insufficient documentation

## 2011-09-18 DIAGNOSIS — E785 Hyperlipidemia, unspecified: Secondary | ICD-10-CM | POA: Insufficient documentation

## 2011-09-18 DIAGNOSIS — R1110 Vomiting, unspecified: Secondary | ICD-10-CM

## 2011-09-18 DIAGNOSIS — R109 Unspecified abdominal pain: Secondary | ICD-10-CM | POA: Insufficient documentation

## 2011-09-18 DIAGNOSIS — I10 Essential (primary) hypertension: Secondary | ICD-10-CM | POA: Insufficient documentation

## 2011-09-18 DIAGNOSIS — Z86718 Personal history of other venous thrombosis and embolism: Secondary | ICD-10-CM | POA: Insufficient documentation

## 2011-09-18 DIAGNOSIS — R112 Nausea with vomiting, unspecified: Secondary | ICD-10-CM | POA: Insufficient documentation

## 2011-09-18 DIAGNOSIS — Z79899 Other long term (current) drug therapy: Secondary | ICD-10-CM | POA: Insufficient documentation

## 2011-09-18 DIAGNOSIS — E119 Type 2 diabetes mellitus without complications: Secondary | ICD-10-CM | POA: Insufficient documentation

## 2011-09-18 LAB — CBC
HCT: 40.7 % (ref 39.0–52.0)
MCH: 30.7 pg (ref 26.0–34.0)
MCV: 93.3 fL (ref 78.0–100.0)
RBC: 4.36 MIL/uL (ref 4.22–5.81)
WBC: 5.3 10*3/uL (ref 4.0–10.5)

## 2011-09-18 LAB — DIFFERENTIAL
Eosinophils Absolute: 0.1 10*3/uL (ref 0.0–0.7)
Eosinophils Relative: 2 % (ref 0–5)
Lymphocytes Relative: 33 % (ref 12–46)
Lymphs Abs: 1.7 10*3/uL (ref 0.7–4.0)
Monocytes Absolute: 0.4 10*3/uL (ref 0.1–1.0)
Monocytes Relative: 8 % (ref 3–12)

## 2011-09-18 LAB — COMPREHENSIVE METABOLIC PANEL
ALT: 12 U/L (ref 0–53)
BUN: 10 mg/dL (ref 6–23)
CO2: 27 mEq/L (ref 19–32)
Calcium: 9.4 mg/dL (ref 8.4–10.5)
Creatinine, Ser: 0.65 mg/dL (ref 0.50–1.35)
GFR calc Af Amer: >90 mL/min (ref >90)
GFR calc non Af Amer: >90 mL/min (ref >90)
Glucose, Bld: 295 mg/dL — ABNORMAL HIGH (ref 70–99)
Total Protein: 7 g/dL (ref 6.0–8.3)

## 2011-09-18 LAB — URINALYSIS, ROUTINE W REFLEX MICROSCOPIC
Hgb urine dipstick: NEGATIVE
Nitrite: NEGATIVE
Protein, ur: NEGATIVE mg/dL
Urobilinogen, UA: 0.2 mg/dL (ref 0.0–1.0)

## 2011-09-18 LAB — URINE MICROSCOPIC-ADD ON

## 2011-09-18 LAB — LIPASE, BLOOD: Lipase: 29 U/L (ref 11–59)

## 2011-09-18 MED ORDER — POLYETHYLENE GLYCOL 3350 17 G PO PACK: 17.0000 g | PACK | Freq: Every day | ORAL | Status: AC

## 2011-09-18 MED ORDER — HYDROMORPHONE HCL PF 1 MG/ML IJ SOLN
1.0000 mg | Freq: Once | INTRAMUSCULAR | Status: AC
  Administered 2011-09-18: 1 mg via INTRAVENOUS
  Filled 2011-09-18: qty 1

## 2011-09-18 MED ORDER — METFORMIN HCL 500 MG PO TABS: 500.0000 mg | ORAL_TABLET | Freq: Two times a day (BID) | ORAL | Status: DC

## 2011-09-18 MED ORDER — ONDANSETRON HCL 4 MG/2ML IJ SOLN: 4.0000 mg | Freq: Once | INTRAMUSCULAR | Status: DC

## 2011-09-18 MED ORDER — ONDANSETRON HCL 4 MG/2ML IJ SOLN
4.0000 mg | Freq: Once | INTRAMUSCULAR | Status: AC
  Administered 2011-09-18: 4 mg via INTRAVENOUS
  Filled 2011-09-18: qty 2

## 2011-09-18 MED ORDER — PROMETHAZINE HCL 25 MG RE SUPP: 25.0000 mg | Freq: Four times a day (QID) | RECTAL | Status: DC | PRN

## 2011-09-18 MED ORDER — FAMOTIDINE 20 MG PO TABS: 20.0000 mg | ORAL_TABLET | Freq: Once | ORAL | Status: DC

## 2011-09-18 MED ORDER — LOSARTAN POTASSIUM 50 MG PO TABS: 100.0000 mg | ORAL_TABLET | Freq: Every day | ORAL | Status: DC

## 2011-09-18 NOTE — ED Notes (Signed)
Onset few days ago lower abdominal pain worsening overtime and states history of these symptoms.  States has nausea and emesis.  Abdomen soft distended bowel sounds present in all quadrants.  Airway intact bilateral equal chest rise and fall.  States increasing general weakness and lives by himself.  Patient states maybe he should be in a facility with assistance.

## 2011-09-18 NOTE — ED Notes (Signed)
Patient reports onset of abd pain, n/v for the past 2 to 3 days.  He has hx of pancreatitis and states these sx feel similar.  He is concerned also due to not being able to keep his medciation down.  Patient states he tried to eat/drink today but vomitted

## 2011-09-18 NOTE — ED Provider Notes (Signed)
Pt well appearing, no distress, abdomen soft He is nontoxic in appearance  Stable for d/c    Date: 09/18/2011  Rate: 84  Rhythm: normal sinus rhythm  QRS Axis: normal  Intervals: normal  ST/T Wave abnormalities: normal  Conduction Disutrbances:none  Narrative Interpretation:   Old EKG Reviewed: unchanged    Joya Gaskins, MD 09/18/11 1409

## 2011-09-18 NOTE — ED Provider Notes (Signed)
Medical screening examination/treatment/procedure(s) were conducted as a shared visit with non-physician practitioner(s) and myself.  I personally evaluated the patient during the encounter  Pt well appearing, abd soft and stable for d/c home SW saw patient BP 154/77  Pulse 94  Temp(Src) 98.7 F (37.1 C) (Oral)  Resp 20  Ht 6\' 5"  (1.956 m)  Wt 325 lb (147.419 kg)  BMI 38.54 kg/m2  SpO2 97%   Joya Gaskins, MD 09/18/11 1640

## 2011-09-18 NOTE — ED Provider Notes (Signed)
History     CSN: [id number]  Arrival date & time 09/18/11  1039   First MD Initiated Contact with Patient 09/18/11 1105      Chief Complaint  Patient presents with  . Abdominal Pain  . Emesis  . Nausea    (Consider location/radiation/quality/duration/timing/severity/associated sxs/prior treatment) Patient is a 61 y.o. male presenting with abdominal pain.  Abdominal Pain The primary symptoms of the illness include abdominal pain, nausea and vomiting. The primary symptoms of the illness do not include fever, shortness of breath, diarrhea or dysuria. The current episode started 2 days ago. The onset of the illness was gradual. The problem has been gradually worsening.  Symptoms associated with the illness do not include chills.  Pt states he hasnt been able to keep anything down for last two days. States he cannot keep his medications down. Reports abdominal pain, points to umbilicus. Normal bowel movement this morning. States feels like pancreatitis. States throwing up yellow and green.   Past Medical History  Diagnosis Date  . Hypertension   . Diabetes mellitus   . DVT (deep venous thrombosis)   . Chronic back pain   . 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 Hyperlipidemia   . CAD (coronary atherosclerotic disease)     s/p stents x2  . Small bowel obstruction     Past Surgical History  Procedure Date  . Heart stent   . Tonsillectomy and adenoidectomy   . Cholecystectomy   . Back surgery     Family History  Problem Relation Age of Onset  . Diabetes Father     History  Substance Use Topics  . Smoking status: Current Some Day Smoker -- 0.2 packs/day for 30 years    Types: Cigarettes  . Smokeless tobacco: Never Used  . Alcohol Use: No      Review of Systems  Constitutional: Negative for fever and chills.  Respiratory: Negative for chest tightness and shortness of breath.   Cardiovascular: Negative.   Gastrointestinal: Positive for  nausea, vomiting and abdominal pain. Negative for diarrhea.  Genitourinary: Negative for dysuria and flank pain.  Musculoskeletal: Negative.     Allergies  Lisinopril and Morphine and related  Home Medications   Current Outpatient Rx  Name Route Sig Dispense Refill  . ALPRAZOLAM 1 MG PO TABS Oral Take 1 tablet (1 mg total) by mouth 2 (two) times daily. 6 tablet 0  . ASPIRIN EC 81 MG PO TBEC Oral Take 81 mg by mouth daily.    Marland Kitchen GABAPENTIN 400 MG PO CAPS Oral Take 1 capsule (400 mg total) by mouth 3 (three) times daily. 15 capsule 0  . GLIPIZIDE 5 MG PO TABS Oral Take 1 tablet (5 mg total) by mouth 2 (two) times daily before a meal. 60 tablet 0  . INSULIN GLARGINE 100 UNIT/ML [place] SOLN Subcutaneous Inject 15 Units into the skin at bedtime. 10 mL 1  . LOSARTAN POTASSIUM 100 MG PO TABS Oral Take 100 mg by mouth daily.    Marland Kitchen METFORMIN HCL 850 MG PO TABS Oral Take 850 mg by mouth 3 (three) times daily.    Marland Kitchen ONDANSETRON HCL 4 MG PO TABS Oral Take 4 mg by mouth every 6 (six) hours as needed. For nausea    . OXYCODONE HCL 30 MG PO TABS Oral Take 1 tablet (30 mg total) by mouth every 6 (six) hours as needed for pain. For pain 15 tablet 0  . RANITIDINE HCL 300  MG PO CAPS Oral Take 300 mg by mouth 2 (two) times daily.      Bernadette Hoit SODIUM 8.6-50 MG PO TABS Oral Take 1 tablet by mouth 2 (two) times daily. 30 tablet 0    BP 154/77  Pulse 94  Temp(Src) 98.7 F (37.1 C) (Oral)  Resp 20  Ht 6\' 5"  (1.956 m)  Wt 325 lb (147.419 kg)  BMI 38.54 kg/m2  SpO2 97%  Physical Exam  Nursing note and vitals reviewed. Constitutional: He is oriented to person, place, and time. He appears well-developed and well-nourished. No distress.  HENT:  Head: Normocephalic.  Eyes: Conjunctivae are normal.  Neck: Neck supple.  Cardiovascular: Normal rate, regular rhythm and normal heart sounds.   Pulmonary/Chest: Effort normal and breath sounds normal. No respiratory distress. He has no wheezes. He has no  rales.  Abdominal: Soft. Bowel sounds are normal. There is no rebound.       Diffuse abdominal tenderness. Guarding in left upper quadrant.  Musculoskeletal: Normal range of motion. He exhibits no edema.  Neurological: He is alert and oriented to person, place, and time.  Skin: Skin is warm and dry.  Psychiatric: He has a normal mood and affect.    ED Course  Procedures (including critical care time)  Pt with abdominal pain, nausea, vomiting. Hx of pancreatitis and SBO. Recent hospitalization for partial SBO. Pt had anormal BM this morning, normal bowel sounds, doubt SBO, will get x-ray. Labs.   Results for orders placed during the hospital encounter of 09/18/11  CBC      Component Value Range   WBC 5.3  4.0 - 10.5 (K/uL)   RBC 4.36  4.22 - 5.81 (MIL/uL)   Hemoglobin 13.4  13.0 - 17.0 (g/dL)   HCT 16.1  09.6 - 04.5 (%)   MCV 93.3  78.0 - 100.0 (fL)   MCH 30.7  26.0 - 34.0 (pg)   MCHC 32.9  30.0 - 36.0 (g/dL)   RDW 40.9  81.1 - 91.4 (%)   Platelets 258  150 - 400 (K/uL)  DIFFERENTIAL      Component Value Range   Neutrophils Relative 57  43 - 77 (%)   Neutro Abs 3.0  1.7 - 7.7 (K/uL)   Lymphocytes Relative 33  12 - 46 (%)   Lymphs Abs 1.7  0.7 - 4.0 (K/uL)   Monocytes Relative 8  3 - 12 (%)   Monocytes Absolute 0.4  0.1 - 1.0 (K/uL)   Eosinophils Relative 2  0 - 5 (%)   Eosinophils Absolute 0.1  0.0 - 0.7 (K/uL)   Basophils Relative 1  0 - 1 (%)   Basophils Absolute 0.0  0.0 - 0.1 (K/uL)  COMPREHENSIVE METABOLIC PANEL      Component Value Range   Sodium 138  135 - 145 (mEq/L)   Potassium 4.2  3.5 - 5.1 (mEq/L)   Chloride 103  96 - 112 (mEq/L)   CO2 27  19 - 32 (mEq/L)   Glucose, Bld 295 (*) 70 - 99 (mg/dL)   BUN 10  6 - 23 (mg/dL)   Creatinine, Ser 7.82  0.50 - 1.35 (mg/dL)   Calcium 9.4  8.4 - 95.6 (mg/dL)   Total Protein 7.0  6.0 - 8.3 (g/dL)   Albumin 3.0 (*) 3.5 - 5.2 (g/dL)   AST 13  0 - 37 (U/L)   ALT 12  0 - 53 (U/L)   Alkaline Phosphatase 80  39 - 117 (U/L)  Total Bilirubin 0.1 (*) 0.3 - 1.2 (mg/dL)   GFR calc non Af Amer >90  >90 (mL/min)   GFR calc Af Amer >90  >90 (mL/min)  LIPASE, BLOOD      Component Value Range   Lipase 29  11 - 59 (U/L)  URINALYSIS, ROUTINE W REFLEX MICROSCOPIC      Component Value Range   Color, Urine YELLOW  YELLOW    APPearance CLEAR  CLEAR    Specific Gravity, Urine 1.018  1.005 - 1.030    pH 7.5  5.0 - 8.0    Glucose, UA >1000 (*) NEGATIVE (mg/dL)   Hgb urine dipstick NEGATIVE  NEGATIVE    Bilirubin Urine NEGATIVE  NEGATIVE    Ketones, ur NEGATIVE  NEGATIVE (mg/dL)   Protein, ur NEGATIVE  NEGATIVE (mg/dL)   Urobilinogen, UA 0.2  0.0 - 1.0 (mg/dL)   Nitrite NEGATIVE  NEGATIVE    Leukocytes, UA NEGATIVE  NEGATIVE   URINE MICROSCOPIC-ADD ON      Component Value Range   WBC, UA 7-10  <3 (WBC/hpf)   RBC / HPF 0-2  <3 (RBC/hpf)   Dg Abd Acute W/chest  09/18/2011  *RADIOLOGY REPORT*  Clinical Data: Diffuse abdominal pain with nausea.  ACUTE ABDOMEN SERIES (ABDOMEN 2 VIEW & CHEST 1 VIEW)  Comparison: Chest radiograph 07/17/2011 and abdominal radiograph 08/23/2011.  Findings: Frontal view of the chest shows midline trachea.  Heart size is accentuated by technique.  Mild bibasilar parenchymal densities appear chronic.  Two views of the abdomen show a fair amount of stool in the colon. No small bowel dilatation.  No free air.  IMPRESSION:  Constipation.  Original Report Authenticated By: Reyes Ivan, M.D.    Pt is not vomiting in ED. In fact, i walked into the room and another pt from across the hallway was in his room and they were eating crackers. Pt's abdomen is soft, he is not guarding at this time. He has had multiple evaluatons for this in the past, he has had CT scans, at least one every month. His blood work is unremarkable. I think pt is stable for discharge home. I will start him on clear liquids, suppositories for nausea, and something for his constipation.   2:44 PM Spoke with Child psychotherapist, Delice Bison,  apparently pt told her that he has had increased problems walking, he ambulated with a walker already. She is going to work on finding him placement from home.      MDM          Lottie Mussel, PA 09/18/11 1626

## 2011-09-18 NOTE — ED Notes (Signed)
Patient to be discharged home and social worker is working on placement from his home residence.  Discharged home with prescriptions and written instructions.

## 2011-09-18 NOTE — ED Notes (Signed)
Social worker at bedside working on possible placement for assisted living.

## 2011-09-18 NOTE — ED Notes (Signed)
EKG given to Bebe Shaggy, MD

## 2011-09-18 NOTE — Discharge Instructions (Signed)
Your x-ray showed constipation. Make sure to take stool softners, take miralax daily. You can try enemas as well. Take phenergan suppositories for nausea. Follow up with your doctor in the next 2 days for recheck.   Nausea and Vomiting Nausea is a sick feeling that often comes before throwing up (vomiting). Vomiting is a reflex where stomach contents come out of your mouth. Vomiting can cause severe loss of body fluids (dehydration). Children and elderly adults can become dehydrated quickly, especially if they also have diarrhea. Nausea and vomiting are symptoms of a condition or disease. It is important to find the cause of your symptoms. CAUSES   Direct irritation of the stomach lining. This irritation can result from increased acid production (gastroesophageal reflux disease), infection, food poisoning, taking certain medicines (such as nonsteroidal anti-inflammatory drugs), alcohol use, or tobacco use.   Signals from the brain.These signals could be caused by a headache, heat exposure, an inner ear disturbance, increased pressure in the brain from injury, infection, a tumor, or a concussion, pain, emotional stimulus, or metabolic problems.   An obstruction in the gastrointestinal tract (bowel obstruction).   Illnesses such as diabetes, hepatitis, gallbladder problems, appendicitis, kidney problems, cancer, sepsis, atypical symptoms of a heart attack, or eating disorders.   Medical treatments such as chemotherapy and radiation.   Receiving medicine that makes you sleep (general anesthetic) during surgery.  DIAGNOSIS Your caregiver may ask for tests to be done if the problems do not improve after a few days. Tests may also be done if symptoms are severe or if the reason for the nausea and vomiting is not clear. Tests may include:  Urine tests.   Blood tests.   Stool tests.   Cultures (to look for evidence of infection).   X-rays or other imaging studies.  Test results can help your  caregiver make decisions about treatment or the need for additional tests. TREATMENT You need to stay well hydrated. Drink frequently but in small amounts.You may wish to drink water, sports drinks, clear broth, or eat frozen ice pops or gelatin dessert to help stay hydrated.When you eat, eating slowly may help prevent nausea.There are also some antinausea medicines that may help prevent nausea. HOME CARE INSTRUCTIONS   Take all medicine as directed by your caregiver.   If you do not have an appetite, do not force yourself to eat. However, you must continue to drink fluids.   If you have an appetite, eat a normal diet unless your caregiver tells you differently.   Eat a variety of complex carbohydrates (rice, wheat, potatoes, bread), lean meats, yogurt, fruits, and vegetables.   Avoid high-fat foods because they are more difficult to digest.   Drink enough water and fluids to keep your urine clear or pale yellow.   If you are dehydrated, ask your caregiver for specific rehydration instructions. Signs of dehydration may include:   Severe thirst.   Dry lips and mouth.   Dizziness.   Dark urine.   Decreasing urine frequency and amount.   Confusion.   Rapid breathing or pulse.  SEEK IMMEDIATE MEDICAL CARE IF:   You have blood or brown flecks (like coffee grounds) in your vomit.   You have black or bloody stools.   You have a severe headache or stiff neck.   You are confused.   You have severe abdominal pain.   You have chest pain or trouble breathing.   You do not urinate at least once every 8 hours.   You  develop cold or clammy skin.   You continue to vomit for longer than 24 to 48 hours.   You have a fever.  MAKE SURE YOU:   Understand these instructions.   Will watch your condition.   Will get help right away if you are not doing well or get worse.  Document Released: 04/15/2005 Document Revised: 04/04/2011 Document Reviewed: 09/12/2010 [facility] Patient  Information 2012 [place], [place].

## 2011-09-21 ENCOUNTER — Inpatient Hospital Stay (HOSPITAL_COMMUNITY)
Admission: EM | Admit: 2011-09-21 | Discharge: 2011-09-25 | DRG: 392 | Disposition: A | Discharge status: Home / Self Care | Payer: Medicare Other | Attending: Internal Medicine | Admitting: Internal Medicine

## 2011-09-21 ENCOUNTER — Inpatient Hospital Stay (HOSPITAL_COMMUNITY): Payer: Medicare Other

## 2011-09-21 ENCOUNTER — Encounter (HOSPITAL_COMMUNITY): Payer: Self-pay

## 2011-09-21 ENCOUNTER — Emergency Department (HOSPITAL_COMMUNITY): Payer: Medicare Other

## 2011-09-21 DIAGNOSIS — Z9861 Coronary angioplasty status: Secondary | ICD-10-CM

## 2011-09-21 DIAGNOSIS — I2510 Atherosclerotic heart disease of native coronary artery without angina pectoris: Secondary | ICD-10-CM

## 2011-09-21 DIAGNOSIS — I10 Essential (primary) hypertension: Secondary | ICD-10-CM

## 2011-09-21 DIAGNOSIS — Z8719 Personal history of other diseases of the digestive system: Secondary | ICD-10-CM

## 2011-09-21 DIAGNOSIS — K219 Gastro-esophageal reflux disease without esophagitis: Secondary | ICD-10-CM

## 2011-09-21 DIAGNOSIS — K56609 Unspecified intestinal obstruction, unspecified as to partial versus complete obstruction: Secondary | ICD-10-CM

## 2011-09-21 DIAGNOSIS — F1120 Opioid dependence, uncomplicated: Secondary | ICD-10-CM | POA: Diagnosis present

## 2011-09-21 DIAGNOSIS — R109 Unspecified abdominal pain: Secondary | ICD-10-CM

## 2011-09-21 DIAGNOSIS — R1013 Epigastric pain: Secondary | ICD-10-CM

## 2011-09-21 DIAGNOSIS — Z79899 Other long term (current) drug therapy: Secondary | ICD-10-CM

## 2011-09-21 DIAGNOSIS — K56600 Partial intestinal obstruction, unspecified as to cause: Secondary | ICD-10-CM

## 2011-09-21 DIAGNOSIS — K5903 Drug induced constipation: Secondary | ICD-10-CM | POA: Diagnosis present

## 2011-09-21 DIAGNOSIS — Z833 Family history of diabetes mellitus: Secondary | ICD-10-CM

## 2011-09-21 DIAGNOSIS — K3189 Other diseases of stomach and duodenum: Secondary | ICD-10-CM | POA: Diagnosis present

## 2011-09-21 DIAGNOSIS — Z794 Long term (current) use of insulin: Secondary | ICD-10-CM

## 2011-09-21 DIAGNOSIS — F17200 Nicotine dependence, unspecified, uncomplicated: Secondary | ICD-10-CM | POA: Diagnosis present

## 2011-09-21 DIAGNOSIS — K31A0 Gastric intestinal metaplasia, unspecified: Secondary | ICD-10-CM | POA: Diagnosis present

## 2011-09-21 DIAGNOSIS — E1165 Type 2 diabetes mellitus with hyperglycemia: Secondary | ICD-10-CM

## 2011-09-21 DIAGNOSIS — T50995A Adverse effect of other drugs, medicaments and biological substances, initial encounter: Secondary | ICD-10-CM | POA: Diagnosis present

## 2011-09-21 DIAGNOSIS — M545 Low back pain, unspecified: Secondary | ICD-10-CM

## 2011-09-21 DIAGNOSIS — G8929 Other chronic pain: Secondary | ICD-10-CM | POA: Diagnosis present

## 2011-09-21 DIAGNOSIS — R739 Hyperglycemia, unspecified: Secondary | ICD-10-CM

## 2011-09-21 DIAGNOSIS — K5909 Other constipation: Secondary | ICD-10-CM | POA: Diagnosis present

## 2011-09-21 DIAGNOSIS — Z7982 Long term (current) use of aspirin: Secondary | ICD-10-CM

## 2011-09-21 DIAGNOSIS — E785 Hyperlipidemia, unspecified: Secondary | ICD-10-CM | POA: Diagnosis present

## 2011-09-21 DIAGNOSIS — K208 Other esophagitis without bleeding: Secondary | ICD-10-CM | POA: Diagnosis present

## 2011-09-21 DIAGNOSIS — K861 Other chronic pancreatitis: Secondary | ICD-10-CM

## 2011-09-21 DIAGNOSIS — E119 Type 2 diabetes mellitus without complications: Secondary | ICD-10-CM

## 2011-09-21 DIAGNOSIS — E876 Hypokalemia: Secondary | ICD-10-CM

## 2011-09-21 DIAGNOSIS — K259 Gastric ulcer, unspecified as acute or chronic, without hemorrhage or perforation: Secondary | ICD-10-CM | POA: Diagnosis present

## 2011-09-21 DIAGNOSIS — K319 Disease of stomach and duodenum, unspecified: Principal | ICD-10-CM | POA: Diagnosis present

## 2011-09-21 DIAGNOSIS — F411 Generalized anxiety disorder: Secondary | ICD-10-CM | POA: Diagnosis present

## 2011-09-21 DIAGNOSIS — Z6835 Body mass index (BMI) 35.0-35.9, adult: Secondary | ICD-10-CM

## 2011-09-21 DIAGNOSIS — Z86718 Personal history of other venous thrombosis and embolism: Secondary | ICD-10-CM

## 2011-09-21 LAB — DIFFERENTIAL
Basophils Absolute: 0 10*3/uL (ref 0.0–0.1)
Basophils Relative: 1 % (ref 0–1)
Eosinophils Absolute: 0 10*3/uL (ref 0.0–0.7)
Eosinophils Relative: 0 % (ref 0–5)
Lymphocytes Relative: 38 % (ref 12–46)
Lymphs Abs: 1.8 10*3/uL (ref 0.7–4.0)
Monocytes Absolute: 0.7 10*3/uL (ref 0.1–1.0)
Monocytes Relative: 15 % — ABNORMAL HIGH (ref 3–12)
Neutro Abs: 2.3 10*3/uL (ref 1.7–7.7)
Neutrophils Relative %: 47 % (ref 43–77)

## 2011-09-21 LAB — LIPASE, BLOOD: Lipase: 32 U/L (ref 11–59)

## 2011-09-21 LAB — URINALYSIS, ROUTINE W REFLEX MICROSCOPIC
Glucose, UA: NEGATIVE mg/dL
Protein, ur: 30 mg/dL — AB
Specific Gravity, Urine: 1.024 (ref 1.005–1.030)
pH: 7.5 (ref 5.0–8.0)

## 2011-09-21 LAB — CBC
HCT: 45.3 % (ref 39.0–52.0)
Hemoglobin: 14.8 g/dL (ref 13.0–17.0)
MCH: 30.5 pg (ref 26.0–34.0)
MCHC: 32.7 g/dL (ref 30.0–36.0)
MCV: 93.4 fL (ref 78.0–100.0)
Platelets: 247 10*3/uL (ref 150–400)
Platelets: 218 10*3/uL (ref 150–400)
RBC: 4.85 MIL/uL (ref 4.22–5.81)
RBC: 4.74 MIL/uL (ref 4.22–5.81)
RDW: 13.8 % (ref 11.5–15.5)
RDW: 13.9 % (ref 11.5–15.5)
WBC: 4.9 10*3/uL (ref 4.0–10.5)
WBC: 5.9 10*3/uL (ref 4.0–10.5)

## 2011-09-21 LAB — CREATININE, SERUM
Creatinine, Ser: 0.69 mg/dL (ref 0.50–1.35)
GFR calc Af Amer: >90 mL/min (ref >90)

## 2011-09-21 LAB — URINE MICROSCOPIC-ADD ON

## 2011-09-21 LAB — COMPREHENSIVE METABOLIC PANEL
Albumin: 3.6 g/dL (ref 3.5–5.2)
BUN: 7 mg/dL (ref 6–23)
Creatinine, Ser: 0.62 mg/dL (ref 0.50–1.35)
Total Protein: 7.7 g/dL (ref 6.0–8.3)

## 2011-09-21 LAB — GLUCOSE, CAPILLARY
Glucose-Capillary: 224 mg/dL — ABNORMAL HIGH (ref 70–99)
Glucose-Capillary: 189 mg/dL — ABNORMAL HIGH (ref 70–99)

## 2011-09-21 MED ORDER — SODIUM CHLORIDE 0.9 % IV SOLN
1000.0000 mL | INTRAVENOUS | Status: DC
  Administered 2011-09-21: 1000 mL via INTRAVENOUS

## 2011-09-21 MED ORDER — HYDROMORPHONE HCL PF 1 MG/ML IJ SOLN
1.0000 mg | INTRAMUSCULAR | Status: AC | PRN
  Administered 2011-09-21 (×3): 1 mg via INTRAVENOUS
  Filled 2011-09-21 (×2): qty 1

## 2011-09-21 MED ORDER — ACETAMINOPHEN 325 MG PO TABS: 650.0000 mg | ORAL_TABLET | Freq: Four times a day (QID) | ORAL | Status: DC | PRN

## 2011-09-21 MED ORDER — HYDROMORPHONE HCL PF 1 MG/ML IJ SOLN
2.0000 mg | INTRAMUSCULAR | Status: DC | PRN
  Administered 2011-09-21 – 2011-09-22 (×3): 2 mg via INTRAVENOUS
  Filled 2011-09-21 (×3): qty 2

## 2011-09-21 MED ORDER — ONDANSETRON HCL 4 MG/2ML IJ SOLN
4.0000 mg | Freq: Once | INTRAMUSCULAR | Status: AC
  Administered 2011-09-21: 4 mg via INTRAVENOUS
  Filled 2011-09-21: qty 2

## 2011-09-21 MED ORDER — ACETAMINOPHEN 650 MG RE SUPP: 650.0000 mg | Freq: Four times a day (QID) | RECTAL | Status: DC | PRN

## 2011-09-21 MED ORDER — SENNA-DOCUSATE SODIUM 8.6-50 MG PO TABS: 1.0000 | ORAL_TABLET | Freq: Every day | ORAL | Status: DC

## 2011-09-21 MED ORDER — ONDANSETRON HCL 4 MG PO TABS: 4.0000 mg | ORAL_TABLET | Freq: Four times a day (QID) | ORAL | Status: DC | PRN

## 2011-09-21 MED ORDER — HYDROMORPHONE HCL PF 1 MG/ML IJ SOLN
1.0000 mg | INTRAMUSCULAR | Status: AC | PRN
  Administered 2011-09-21 (×2): 1 mg via INTRAVENOUS
  Filled 2011-09-21 (×2): qty 1

## 2011-09-21 MED ORDER — ALPRAZOLAM 0.5 MG PO TABS
1.0000 mg | ORAL_TABLET | Freq: Two times a day (BID) | ORAL | Status: DC
  Administered 2011-09-21 – 2011-09-25 (×8): 1 mg via ORAL
  Filled 2011-09-21 (×3): qty 2
  Filled 2011-09-21 (×3): qty 1
  Filled 2011-09-21 (×2): qty 2
  Filled 2011-09-21: qty 1
  Filled 2011-09-21: qty 2

## 2011-09-21 MED ORDER — SENNOSIDES-DOCUSATE SODIUM 8.6-50 MG PO TABS
1.0000 | ORAL_TABLET | Freq: Every day | ORAL | Status: DC
  Administered 2011-09-21 – 2011-09-23 (×3): 1 via ORAL
  Filled 2011-09-21 (×3): qty 1

## 2011-09-21 MED ORDER — METOCLOPRAMIDE HCL 5 MG/ML IJ SOLN
5.0000 mg | Freq: Three times a day (TID) | INTRAMUSCULAR | Status: DC
  Administered 2011-09-21: 5 mg via INTRAVENOUS
  Filled 2011-09-21 (×6): qty 1

## 2011-09-21 MED ORDER — SODIUM CHLORIDE 0.9 % IV SOLN
1000.0000 mL | Freq: Once | INTRAVENOUS | Status: AC
  Administered 2011-09-21: 1000 mL via INTRAVENOUS

## 2011-09-21 MED ORDER — LOSARTAN POTASSIUM 50 MG PO TABS
100.0000 mg | ORAL_TABLET | Freq: Every day | ORAL | Status: DC
  Administered 2011-09-21 – 2011-09-25 (×5): 100 mg via ORAL
  Filled 2011-09-21 (×5): qty 2

## 2011-09-21 MED ORDER — ALBUTEROL SULFATE (5 MG/ML) 0.5% IN NEBU: 2.5000 mg | INHALATION_SOLUTION | RESPIRATORY_TRACT | Status: DC | PRN

## 2011-09-21 MED ORDER — INSULIN GLARGINE 100 UNIT/ML ~~LOC~~ SOLN
8.0000 [IU] | Freq: Every day | SUBCUTANEOUS | Status: DC
  Administered 2011-09-21 – 2011-09-24 (×4): 8 [IU] via SUBCUTANEOUS

## 2011-09-21 MED ORDER — PANTOPRAZOLE SODIUM 40 MG IV SOLR
40.0000 mg | INTRAVENOUS | Status: DC
  Administered 2011-09-21: 40 mg via INTRAVENOUS
  Filled 2011-09-21 (×2): qty 40

## 2011-09-21 MED ORDER — INSULIN ASPART 100 UNIT/ML ~~LOC~~ SOLN
0.0000 [IU] | Freq: Three times a day (TID) | SUBCUTANEOUS | Status: DC
  Administered 2011-09-22: 1 [IU] via SUBCUTANEOUS
  Administered 2011-09-23 – 2011-09-24 (×6): 2 [IU] via SUBCUTANEOUS
  Administered 2011-09-25: 1 [IU] via SUBCUTANEOUS
  Administered 2011-09-25: 3 [IU] via SUBCUTANEOUS

## 2011-09-21 MED ORDER — HYDROMORPHONE HCL PF 1 MG/ML IJ SOLN
INTRAMUSCULAR | Status: AC
  Administered 2011-09-21: 1 mg via INTRAVENOUS
  Filled 2011-09-21: qty 1

## 2011-09-21 MED ORDER — SODIUM CHLORIDE 0.9 % IV SOLN
INTRAVENOUS | Status: DC
  Administered 2011-09-21 – 2011-09-22 (×3): via INTRAVENOUS

## 2011-09-21 MED ORDER — ENOXAPARIN SODIUM 40 MG/0.4ML ~~LOC~~ SOLN
40.0000 mg | SUBCUTANEOUS | Status: DC
  Administered 2011-09-21 – 2011-09-24 (×4): 40 mg via SUBCUTANEOUS
  Filled 2011-09-21 (×6): qty 0.4

## 2011-09-21 MED ORDER — IOHEXOL 300 MG/ML  SOLN
100.0000 mL | Freq: Once | INTRAMUSCULAR | Status: AC | PRN
  Administered 2011-09-21: 100 mL via INTRAVENOUS

## 2011-09-21 MED ORDER — HYDROMORPHONE HCL PF 1 MG/ML IJ SOLN
1.0000 mg | INTRAMUSCULAR | Status: AC | PRN
  Administered 2011-09-21 (×3): 1 mg via INTRAVENOUS
  Filled 2011-09-21 (×3): qty 1

## 2011-09-21 MED ORDER — ONDANSETRON HCL 4 MG/2ML IJ SOLN: 4.0000 mg | Freq: Four times a day (QID) | INTRAMUSCULAR | Status: DC | PRN

## 2011-09-21 MED ORDER — POLYETHYLENE GLYCOL 3350 17 G PO PACK
17.0000 g | PACK | Freq: Two times a day (BID) | ORAL | Status: DC
  Administered 2011-09-22 – 2011-09-23 (×3): 17 g via ORAL
  Filled 2011-09-21 (×7): qty 1

## 2011-09-21 MED ORDER — IOHEXOL 300 MG/ML  SOLN
20.0000 mL | INTRAMUSCULAR | Status: AC
Start: 2011-09-21 — End: 2011-09-21
  Administered 2011-09-21: 20 mL via ORAL

## 2011-09-21 NOTE — Progress Notes (Signed)
Pt arrived from ED a/o, NG clamped, c/o abd pain # 10, c/o nausea, drank 2 cups of contrast for CT, pain med given, NS added @ 125cc/hr, skin intact, verbal, good historian,  IV rt hand for 5/24, ice chips, full code. Plan: control pain, CT abd, NG to low suction, monitor labs & VS, return home at d/c.

## 2011-09-21 NOTE — ED Notes (Signed)
Patient transported to X-ray 

## 2011-09-21 NOTE — ED Notes (Signed)
Patient is waiting on CT results

## 2011-09-21 NOTE — ED Notes (Signed)
Admitting Md at bedside

## 2011-09-21 NOTE — Progress Notes (Signed)
09/21/11 1505  OTHER  CSW Follow Up Status No follow-up needed    Please consult  Unit based LCSW if psychosocial or placements needs arise.  Dionne Milo MSW [facility]. Weekend/Social Worker [phone]

## 2011-09-21 NOTE — ED Provider Notes (Signed)
History     CSN: [id number]  Arrival date & time 09/21/11  1014   First MD Initiated Contact with Patient 09/21/11 1039      Chief Complaint  Patient presents with  . Abdominal Pain  . Emesis    Patient is a 61 y.o. male presenting with abdominal pain and vomiting. The history is provided by the patient.  Abdominal Pain The primary symptoms of the illness include abdominal pain, nausea and vomiting. The primary symptoms of the illness do not include dysuria. The current episode started more than 2 days ago (7 days ago). The onset of the illness was gradual.  The pain came on gradually. The abdominal pain has been unchanged since its onset. The abdominal pain is generalized. The abdominal pain does not radiate. The abdominal pain is relieved by nothing. The abdominal pain is exacerbated by eating.  Emesis  Associated symptoms include abdominal pain.  Pt has been vomiting several times daily.  He has not been able to keep any of his medications down.  Pt has history of recurrent abdominal pain problems.  He states he does not see a doctor for this issue.  He was in the ED a few days ago and had a normal workup except for constipation noted on his x-rays.  Pt states he has not gotten any better.  His last BM was this am and was normal.  Pt thinks he might have had a fever last night.  Past Medical History  Diagnosis Date  . Hypertension   . Diabetes mellitus   . DVT (deep venous thrombosis)   . Chronic back pain   . 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 Hyperlipidemia   . CAD (coronary atherosclerotic disease)     s/p stents x2  . Small bowel obstruction     Past Surgical History  Procedure Date  . Heart stent   . Tonsillectomy and adenoidectomy   . Cholecystectomy   . Back surgery     Family History  Problem Relation Age of Onset  . Diabetes Father     History  Substance Use Topics  . Smoking status: Current Some Day Smoker -- 0.2 packs/day for  30 years    Types: Cigarettes  . Smokeless tobacco: Never Used  . Alcohol Use: No      Review of Systems  Gastrointestinal: Positive for nausea, vomiting and abdominal pain.  Genitourinary: Negative for dysuria.  All other systems reviewed and are negative.    Allergies  Lisinopril and Morphine and related  Home Medications   Current Outpatient Rx  Name Route Sig Dispense Refill  . ALPRAZOLAM 1 MG PO TABS Oral Take 1 tablet (1 mg total) by mouth 2 (two) times daily. 6 tablet 0  . ASPIRIN EC 81 MG PO TBEC Oral Take 81 mg by mouth daily.    Marland Kitchen GABAPENTIN 400 MG PO CAPS Oral Take 1 capsule (400 mg total) by mouth 3 (three) times daily. 15 capsule 0  . GLIPIZIDE 5 MG PO TABS Oral Take 1 tablet (5 mg total) by mouth 2 (two) times daily before a meal. 60 tablet 0  . INSULIN GLARGINE 100 UNIT/ML [place] SOLN Subcutaneous Inject 15 Units into the skin at bedtime. 10 mL 1  . LOSARTAN POTASSIUM 100 MG PO TABS Oral Take 100 mg by mouth daily.    Marland Kitchen METFORMIN HCL 850 MG PO TABS Oral Take 850 mg by mouth 3 (three) times daily.    Marland Kitchen  ONDANSETRON HCL 4 MG PO TABS Oral Take 4 mg by mouth every 6 (six) hours as needed. For nausea    . OXYCODONE HCL 30 MG PO TABS Oral Take 1 tablet (30 mg total) by mouth every 6 (six) hours as needed for pain. For pain 15 tablet 0  . POLYETHYLENE GLYCOL 3350 PO PACK Oral Take 17 g by mouth daily. 14 each 0  . PROMETHAZINE HCL 25 MG RE SUPP Rectal Place 1 suppository (25 mg total) rectally every 6 (six) hours as needed for nausea. 12 each 0  . RANITIDINE HCL 300 MG PO CAPS Oral Take 300 mg by mouth 2 (two) times daily.      Bernadette Hoit SODIUM 8.6-50 MG PO TABS Oral Take 1 tablet by mouth 2 (two) times daily. 30 tablet 0    BP 159/83  Pulse 76  Temp 98.6 F (37 C)  Resp 18  SpO2 100%  Physical Exam  Nursing note and vitals reviewed. Constitutional: He appears well-developed and well-nourished. He appears distressed.  HENT:  Head: Normocephalic and  atraumatic.  Right Ear: External ear normal.  Left Ear: External ear normal.       Poor dentition  Eyes: Conjunctivae are normal. Right eye exhibits no discharge. Left eye exhibits no discharge. No scleral icterus.  Neck: Neck supple. No tracheal deviation present.  Cardiovascular: Normal rate, regular rhythm and intact distal pulses.   Pulmonary/Chest: Effort normal and breath sounds normal. No stridor. No respiratory distress. He has no wheezes. He has no rales.  Abdominal: Soft. Bowel sounds are normal. He exhibits no distension and no mass. There is generalized tenderness. There is no rigidity, no rebound and no guarding. No hernia.  Musculoskeletal: He exhibits no edema and no tenderness.  Neurological: He is alert. He has normal strength. No sensory deficit. Cranial nerve deficit:  no gross defecits noted. He exhibits normal muscle tone. He displays no seizure activity. Coordination normal.  Skin: Skin is warm and dry. No rash noted.  Psychiatric: He has a normal mood and affect.    ED Course  Procedures (including critical care time)  Labs Reviewed  DIFFERENTIAL - Abnormal; Notable for the following:    Monocytes Relative 15 (*)    All other components within normal limits  COMPREHENSIVE METABOLIC PANEL - Abnormal; Notable for the following:    Glucose, Bld 222 (*)    All other components within normal limits  URINALYSIS, ROUTINE W REFLEX MICROSCOPIC - Abnormal; Notable for the following:    APPearance TURBID (*)    Ketones, ur 15 (*)    Protein, ur 30 (*)    Leukocytes, UA TRACE (*)    All other components within normal limits  GLUCOSE, CAPILLARY - Abnormal; Notable for the following:    Glucose-Capillary 224 (*)    All other components within normal limits  CBC  LIPASE, BLOOD  URINE MICROSCOPIC-ADD ON   Dg Abd Acute W/chest  09/21/2011  *RADIOLOGY REPORT*  Clinical Data: Mid to lower abdominal pain for 1 week.  Nausea, vomiting.  Right red blood in vomitus.  Diarrhea.   History of pancreatitis.  History of small bowel obstruction.  ACUTE ABDOMEN SERIES (ABDOMEN 2 VIEW & CHEST 1 VIEW)  Comparison: 09/18/2011  Findings: The heart is normal in size.  There is prominence of interstitial markings especially at the bases, raising the question of interstitial infiltrates.  No pleural effusions are identified. No free intraperitoneal air.  There is dilatation of a small bowel loop within  the left mid abdomen, consistent with partial or early small bowel obstruction.  Large bowel is not dilated.  Patient has had prior lower lumbar spine surgery.  Surgical clips are identified within the right upper quadrant of the abdomen.  IMPRESSION:  1. New bilateral lower lobe interstitial infiltrates.  Question of aspiration. 2.  Partial or early small bowel obstruction. 3.  No free intraperitoneal air.  Original Report Authenticated By: Patterson Hammersmith, M.D.     1. Partial small bowel obstruction       MDM  The patient is feeling somewhat better after treatment in the emergency room however did require several doses of pain medications.  The x-ray shows possible recurrent partial or early small bowel obstruction.  The patient has new bilateral lower lobe interstitial infiltrates but is not having any coughing or shortness of breath.  It is possible this could be related to atelectasis. Will get the patient admitted to the hospital for further treatment. I have ordered nasogastric tube. Old records have been reviewed and patient has had this problem in the past. Previously felt that it could be related to his chronic narcotic use suggesting more of an ileus.        Celene Kras, MD 09/21/11 [phone]

## 2011-09-21 NOTE — H&P (Signed)
PATIENT DETAILS Name: Ryan [name] Age: 61 y.o. Sex: male Date of Birth: June 05, 1950 Admit Date: 09/21/2011 HYQ:[id number],XBMWUX, MD, MD   CHIEF COMPLAINT:  Abdominal pain along with nausea/vomiting-1 week  HPI: Patient is a 61 year old African American male with a history of hypertension, diabetes, dyslipidemia, frequent ER visits and admissions for acute or chronic abdominal pain comes in with the above-noted complaints. Apparently patient claims that he has been having abdominal pain along with nausea and vomiting for approximately one week. He claims that he was in the ED a few days ago for similar complaints and was discharged home. He claims that the abdominal pain is mostly located in his mid abdominal area, at its worst it is around 10?10 with no specific radiation. He describes the pain to be off colicky in nature, with no particular aggravating or relieving factors. He claims to have numerous episodes of nausea and vomiting over the past few days, he claims that he is not even able to keep liquids down. He claims that he had 2 bowel movements today, claims that he has been moving his bowels almost on a daily basis for the past 2 days. Because of the persistent nature of his abdominal pain he was evaluated in the ED, a abdominal x-ray shows possible early/partial bowel obstruction. He subsequently has been referred to the hospitalist service for further evaluation and treatment. Patient denies any headache, shortness of breath, chest pain. Patient had hematuria or polyuria or dysuria.   ALLERGIES:   Allergies  Allergen Reactions  . Lisinopril Swelling and Rash  . Morphine And Related Swelling and Rash    PAST MEDICAL HISTORY: Past Medical History  Diagnosis Date  . Hypertension   . Diabetes mellitus   . DVT (deep venous thrombosis)   . Chronic back pain   . 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 Hyperlipidemia   . CAD (coronary atherosclerotic  disease)     s/p stents x2  . Small bowel obstruction     PAST SURGICAL HISTORY: Past Surgical History  Procedure Date  . Heart stent   . Tonsillectomy and adenoidectomy   . Cholecystectomy   . Back surgery     MEDICATIONS AT HOME: Prior to Admission medications   Medication Sig Start Date End Date Taking? Authorizing Provider  ALPRAZolam Prudy Feeler) 1 MG tablet Take 1 tablet (1 mg total) by mouth 2 (two) times daily. 08/24/11  Yes Yeng Perz Levora Dredge, MD  aspirin EC 81 MG tablet Take 81 mg by mouth daily.   Yes Historical Provider, MD  gabapentin (NEURONTIN) 400 MG capsule Take 1 capsule (400 mg total) by mouth 3 (three) times daily. 08/24/11  Yes Lonita Debes Levora Dredge, MD  glipiZIDE (GLUCOTROL XL) 10 MG 24 hr tablet Take 10 mg by mouth 2 (two) times daily.   Yes Historical Provider, MD  insulin glargine (LANTUS) 100 UNIT/ML injection Inject 15 Units into the skin at bedtime. 08/24/11 08/23/12 Yes Christon Gallaway Levora Dredge, MD  losartan (COZAAR) 100 MG tablet Take 100 mg by mouth daily.   Yes Historical Provider, MD  metFORMIN (GLUCOPHAGE) 850 MG tablet Take 850 mg by mouth 3 (three) times daily.   Yes Historical Provider, MD  ondansetron (ZOFRAN) 4 MG tablet Take 4 mg by mouth every 6 (six) hours as needed. For nausea   Yes Historical Provider, MD  oxycodone (ROXICODONE) 30 MG immediate release tablet Take 30 mg by mouth every 4 (four) hours as needed. For pain  Yes Historical Provider, MD  polyethylene glycol (MIRALAX) packet Take 17 g by mouth daily. 09/18/11 09/21/11 Yes Tatyana A Kirichenko, PA  ranitidine (ZANTAC) 300 MG capsule Take 300 mg by mouth 2 (two) times daily.     Yes Historical Provider, MD  sennosides-docusate sodium (SENOKOT-S) 8.6-50 MG tablet Take 1 tablet by mouth 2 (two) times daily as needed. For constipation   Yes Historical Provider, MD    FAMILY HISTORY: Family History  Problem Relation Age of Onset  . Diabetes Father     SOCIAL HISTORY:  reports that he has been smoking  Cigarettes.  He has a 7.5 pack-year smoking history. He has never used smokeless tobacco. He reports that he does not drink alcohol or use illicit drugs.  REVIEW OF SYSTEMS:  Constitutional:   No  weight loss, night sweats,  Fevers, chills, fatigue.  HEENT:    No headaches, Difficulty swallowing,Tooth/dental problems,Sore throat,  No sneezing, itching, ear ache, nasal congestion, post nasal drip,   Cardio-vascular: No chest pain,  Orthopnea, PND, swelling in lower extremities, anasarca, dizziness, palpitations  GI:  No heartburn, indigestion,  diarrhea, change in  bowel habits, loss of appetite  Resp: No shortness of breath with exertion or at rest.  No excess mucus, no productive cough, No non-productive cough,  No coughing up of blood.No change in color of mucus.No wheezing.No chest wall deformity  Skin:  no rash or lesions.  GU:  no dysuria, change in color of urine, no urgency or frequency.  No flank pain.  Musculoskeletal: No joint pain or swelling.  No decreased range of motion.  No back pain.  Psych: No change in mood or affect. No depression or anxiety.  No memory loss.   PHYSICAL EXAM: Blood pressure 160/65, pulse 58, temperature 98 F (36.7 C), temperature source Oral, resp. rate 19, SpO2 97.00%.  General appearance :Awake, alert, not in any distress. Speech Clear. Not toxic Looking HEENT: Atraumatic and Normocephalic, pupils equally reactive to light and accomodation Neck: supple, no JVD. No cervical lymphadenopathy.  Chest:Good air entry bilaterally, no added sounds  CVS: S1 S2 regular, no murmurs.  Abdomen: Bowel sounds present, Mildly tender all over and not distended with no gaurding, rigidity or rebound. Extremities: B/L Lower Ext shows no edema, both legs are warm to touch, with  dorsalis pedis pulses palpable. Neurology: Awake alert, and oriented X 3, CN II-XII intact, Non focal, Deep Tendon Reflex-2+ all over, plantar's downgoing B/L, sensory exam is  grossly intact.  Skin:No Rash Wounds:N/A  LABS ON ADMISSION:   Basename 09/21/11 1059  NA 138  K 4.0  CL 103  CO2 24  GLUCOSE 222*  BUN 7  CREATININE 0.62  CALCIUM 9.7  MG --  PHOS --    Basename 09/21/11 1059  AST 12  ALT 8  ALKPHOS 78  BILITOT 0.3  PROT 7.7  ALBUMIN 3.6    Basename 09/21/11 1059  LIPASE 32  AMYLASE --    Basename 09/21/11 1059  WBC 4.9  NEUTROABS 2.3  HGB 14.8  HCT 45.3  MCV 93.4  PLT 247   No results found for this basename: CKTOTAL:3,CKMB:3,CKMBINDEX:3,TROPONINI:3 in the last 72 hours No results found for this basename: DDIMER:2 in the last 72 hours No components found with this basename: POCBNP:3   RADIOLOGIC STUDIES ON ADMISSION: Dg Abd 1 View  08/23/2011  *RADIOLOGY REPORT*  Clinical Data: Abdominal pain.  Nausea and vomiting.  Constipation.  ABDOMEN - 1 VIEW  Comparison: 07/17/2011  Findings: No  evidence of dilated bowel loops.  A small amount of residual contrast is seen in the left colon. Significant decrease in colonic stool is noted compared to prior exam.  No radiopaque calculi identified.  The right upper quadrant surgical clips seen from prior cholecystectomy.  Previous lower lumbar spine fusion hardware again noted.  IMPRESSION: Decreased colonic stool since prior exam.  No acute findings.  Original Report Authenticated By: Danae Orleans, M.D.   Dg Abd Acute W/chest  09/21/2011  *RADIOLOGY REPORT*  Clinical Data: Mid to lower abdominal pain for 1 week.  Nausea, vomiting.  Right red blood in vomitus.  Diarrhea.  History of pancreatitis.  History of small bowel obstruction.  ACUTE ABDOMEN SERIES (ABDOMEN 2 VIEW & CHEST 1 VIEW)  Comparison: 09/18/2011  Findings: The heart is normal in size.  There is prominence of interstitial markings especially at the bases, raising the question of interstitial infiltrates.  No pleural effusions are identified. No free intraperitoneal air.  There is dilatation of a small bowel loop within the left mid  abdomen, consistent with partial or early small bowel obstruction.  Large bowel is not dilated.  Patient has had prior lower lumbar spine surgery.  Surgical clips are identified within the right upper quadrant of the abdomen.  IMPRESSION:  1. New bilateral lower lobe interstitial infiltrates.  Question of aspiration. 2.  Partial or early small bowel obstruction. 3.  No free intraperitoneal air.  Original Report Authenticated By: Patterson Hammersmith, M.D.   Dg Abd Acute W/chest  09/18/2011  *RADIOLOGY REPORT*  Clinical Data: Diffuse abdominal pain with nausea.  ACUTE ABDOMEN SERIES (ABDOMEN 2 VIEW & CHEST 1 VIEW)  Comparison: Chest radiograph 07/17/2011 and abdominal radiograph 08/23/2011.  Findings: Frontal view of the chest shows midline trachea.  Heart size is accentuated by technique.  Mild bibasilar parenchymal densities appear chronic.  Two views of the abdomen show a fair amount of stool in the colon. No small bowel dilatation.  No free air.  IMPRESSION:  Constipation.  Original Report Authenticated By: Reyes Ivan, M.D.    ASSESSMENT AND PLAN: Present on Admission:  .Abdominal pain -Not sure the exact etiology here is. However the patient this is been going on for approximately 7 days. His abdomen is soft with some mild diffuse tenderness without any rebound or rigidity. Abdominal x-rays noted as above. Questionable developing ileus vs early bowel obstruction-however patient has had 2 bowel movements today has passed numerous flatus today as well. -Patient will be admitted to the MedSurg unit, n.p.o. status will be continued, NG tube has already been inserted in the ED this will be continued to low intermittent suction. We'll place him on IV fluids, and provided him as needed narcotics and antiemetics and other supportive care.  -Given the persistent nature of his pain and vomiting, CT scan of the abdomen will be obtained, further plan will depend on the results of the CT scan and patient's  clinical course. If needed surgery will be consulted as well.   Marland KitchenHYPERTENSION -We'll continue with patient's usual home medications for now   .GERD - Continue with proton pump inhibitor  .DIABETES MELLITUS, TYPE II -Decrease Lantus, hold oral hypoglycemics and place on sliding scale insulin.  Further plan will depend as patient's clinical course evolves and further radiologic and laboratory data become available. Patient will be monitored closely.  DVT Prophylaxis: Prophylactic Lovenox  Code Status: Full code  Total time spent for admission equals 45 minutes.  Jeoffrey Massed 09/21/2011, 6:13 PM

## 2011-09-21 NOTE — ED Notes (Signed)
Attempted to call report x1 sts will cal back

## 2011-09-21 NOTE — ED Notes (Signed)
Returned from CT.

## 2011-09-21 NOTE — ED Notes (Signed)
Pt complains of abd pain onset several days ago, seen here for same, no improvement in symptoms.

## 2011-09-22 ENCOUNTER — Encounter (HOSPITAL_COMMUNITY)
Admission: EM | Disposition: A | Discharge status: Home / Self Care | Payer: Self-pay | Source: Home / Self Care | Attending: Internal Medicine

## 2011-09-22 ENCOUNTER — Encounter (HOSPITAL_COMMUNITY): Payer: Self-pay | Admitting: Gastroenterology

## 2011-09-22 DIAGNOSIS — E1165 Type 2 diabetes mellitus with hyperglycemia: Secondary | ICD-10-CM

## 2011-09-22 DIAGNOSIS — R1013 Epigastric pain: Secondary | ICD-10-CM

## 2011-09-22 DIAGNOSIS — K219 Gastro-esophageal reflux disease without esophagitis: Secondary | ICD-10-CM

## 2011-09-22 DIAGNOSIS — I10 Essential (primary) hypertension: Secondary | ICD-10-CM

## 2011-09-22 HISTORY — PX: ESOPHAGOGASTRODUODENOSCOPY: SHX5428

## 2011-09-22 LAB — GLUCOSE, CAPILLARY: Glucose-Capillary: 161 mg/dL — ABNORMAL HIGH (ref 70–99)

## 2011-09-22 LAB — COMPREHENSIVE METABOLIC PANEL
ALT: 7 U/L (ref 0–53)
AST: 11 U/L (ref 0–37)
Calcium: 8.8 mg/dL (ref 8.4–10.5)
Creatinine, Ser: 0.6 mg/dL (ref 0.50–1.35)
GFR calc Af Amer: >90 mL/min (ref >90)
Glucose, Bld: 164 mg/dL — ABNORMAL HIGH (ref 70–99)
Sodium: 134 mEq/L — ABNORMAL LOW (ref 135–145)
Total Protein: 6.7 g/dL (ref 6.0–8.3)

## 2011-09-22 LAB — PROTIME-INR: INR: 0.97 (ref 0.00–1.49)

## 2011-09-22 LAB — CBC
HCT: 43.8 % (ref 39.0–52.0)
Hemoglobin: 14 g/dL (ref 13.0–17.0)
MCHC: 32 g/dL (ref 30.0–36.0)
RBC: 4.58 MIL/uL (ref 4.22–5.81)

## 2011-09-22 SURGERY — EGD (ESOPHAGOGASTRODUODENOSCOPY)
Anesthesia: Moderate Sedation

## 2011-09-22 MED ORDER — SODIUM CHLORIDE 0.9 % IV SOLN
INTRAVENOUS | Status: DC
  Administered 2011-09-22 – 2011-09-24 (×3): via INTRAVENOUS

## 2011-09-22 MED ORDER — MIDAZOLAM HCL 10 MG/2ML IJ SOLN
INTRAMUSCULAR | Status: AC
  Filled 2011-09-22: qty 4

## 2011-09-22 MED ORDER — DIPHENHYDRAMINE HCL 50 MG/ML IJ SOLN
INTRAMUSCULAR | Status: AC
  Filled 2011-09-22: qty 1

## 2011-09-22 MED ORDER — LIDOCAINE VISCOUS 2 % MT SOLN
OROMUCOSAL | Status: AC
  Filled 2011-09-22: qty 15

## 2011-09-22 MED ORDER — PANTOPRAZOLE SODIUM 40 MG IV SOLR
40.0000 mg | Freq: Two times a day (BID) | INTRAVENOUS | Status: DC
Start: 2011-09-23 — End: 2011-09-22

## 2011-09-22 MED ORDER — FLEET ENEMA 7-19 GM/118ML RE ENEM
1.0000 | ENEMA | Freq: Once | RECTAL | Status: DC
  Filled 2011-09-22: qty 1

## 2011-09-22 MED ORDER — METOCLOPRAMIDE HCL 5 MG/ML IJ SOLN
5.0000 mg | Freq: Three times a day (TID) | INTRAMUSCULAR | Status: DC
  Filled 2011-09-22 (×3): qty 1

## 2011-09-22 MED ORDER — FENTANYL NICU IV SYRINGE 50 MCG/ML
INJECTION | INTRAMUSCULAR | Status: DC | PRN
  Administered 2011-09-22 (×4): 25 ug via INTRAVENOUS

## 2011-09-22 MED ORDER — PANTOPRAZOLE SODIUM 40 MG IV SOLR
40.0000 mg | Freq: Two times a day (BID) | INTRAVENOUS | Status: DC
  Administered 2011-09-22 – 2011-09-23 (×3): 40 mg via INTRAVENOUS
  Filled 2011-09-22 (×5): qty 40

## 2011-09-22 MED ORDER — HYDROMORPHONE HCL PF 1 MG/ML IJ SOLN
2.0000 mg | INTRAMUSCULAR | Status: DC | PRN
  Administered 2011-09-22: 2 mg via INTRAVENOUS
  Administered 2011-09-22: 1 mg via INTRAVENOUS
  Administered 2011-09-22 – 2011-09-23 (×5): 2 mg via INTRAVENOUS
  Filled 2011-09-22 (×6): qty 2

## 2011-09-22 MED ORDER — LIDOCAINE VISCOUS 2 % MT SOLN
OROMUCOSAL | Status: DC | PRN
  Administered 2011-09-22: 10 mL via OROMUCOSAL

## 2011-09-22 MED ORDER — HYDROMORPHONE HCL PF 1 MG/ML IJ SOLN
1.0000 mg | INTRAMUSCULAR | Status: DC | PRN
  Administered 2011-09-22: 1 mg via INTRAVENOUS
  Filled 2011-09-22 (×2): qty 1

## 2011-09-22 MED ORDER — MIDAZOLAM HCL 10 MG/2ML IJ SOLN
INTRAMUSCULAR | Status: DC | PRN
  Administered 2011-09-22 (×3): 2 mg via INTRAVENOUS

## 2011-09-22 MED ORDER — FENTANYL CITRATE 0.05 MG/ML IJ SOLN
INTRAMUSCULAR | Status: AC
  Filled 2011-09-22: qty 4

## 2011-09-22 MED ORDER — OXYCODONE HCL 5 MG PO TABS
30.0000 mg | ORAL_TABLET | ORAL | Status: DC | PRN
  Filled 2011-09-22: qty 1

## 2011-09-22 NOTE — Consult Note (Signed)
Reason for Consult:Abdominal pain-Cross cover for [facility]. Referring Physician: Joya Salm  Ryan [name] is an 61 y.o. male.  HPI: Patient gives a 1 week history of epigastric pain, with no specific relieving or aggravating factors. He claims he has had about 12 bouts of pancreatitis in the last 3 years. He claims the episodes were not as frequent after his cholecystectomy but the ER records indicate otherwise. He has nausea with vomiting but denies having any other associated GI symptoms. CT scan of the abdomen indicates no evidence of acute disease.   Past Medical History  Diagnosis Date  . Hypertension   . Diabetes mellitus   . DVT (deep venous thrombosis)   . Chronic back pain   . 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 Hyperlipidemia   . CAD (coronary atherosclerotic disease)     s/p stents x2  . Small bowel obstruction     Past Surgical History  Procedure Date  . Heart stent   . Tonsillectomy and adenoidectomy   . Cholecystectomy   . Back surgery     Family History  Problem Relation Age of Onset  . Diabetes Father    Social History:  reports that he has been smoking Cigarettes.  He has a 7.5 pack-year smoking history. He has never used smokeless tobacco. He reports that he does not drink alcohol or use illicit drugs.  Allergies:  Allergies  Allergen Reactions  . Lisinopril Swelling and Rash  . Morphine And Related Swelling and Rash   Medications: I have reviewed the patient's current medications.  Results for orders placed during the hospital encounter of 09/21/11 (from the past 48 hour(s))  CBC     Status: Normal   Collection Time   09/21/11 10:59 AM      Component Value Range Comment   WBC 4.9  4.0 - 10.5 (K/uL)    RBC 4.85  4.22 - 5.81 (MIL/uL)    Hemoglobin 14.8  13.0 - 17.0 (g/dL)    HCT 30.8  65.7 - 84.6 (%)    MCV 93.4  78.0 - 100.0 (fL)    MCH 30.5  26.0 - 34.0 (pg)    MCHC 32.7  30.0 - 36.0 (g/dL)    RDW 96.2  95.2 -  84.1 (%)    Platelets 247  150 - 400 (K/uL)   DIFFERENTIAL     Status: Abnormal   Collection Time   09/21/11 10:59 AM      Component Value Range Comment   Neutrophils Relative 47  43 - 77 (%)    Neutro Abs 2.3  1.7 - 7.7 (K/uL)    Lymphocytes Relative 38  12 - 46 (%)    Lymphs Abs 1.8  0.7 - 4.0 (K/uL)    Monocytes Relative 15 (*) 3 - 12 (%)    Monocytes Absolute 0.7  0.1 - 1.0 (K/uL)    Eosinophils Relative 0  0 - 5 (%)    Eosinophils Absolute 0.0  0.0 - 0.7 (K/uL)    Basophils Relative 1  0 - 1 (%)    Basophils Absolute 0.0  0.0 - 0.1 (K/uL)   COMPREHENSIVE METABOLIC PANEL     Status: Abnormal   Collection Time   09/21/11 10:59 AM      Component Value Range Comment   Sodium 138  135 - 145 (mEq/L)    Potassium 4.0  3.5 - 5.1 (mEq/L)    Chloride 103  96 - 112 (mEq/L)  CO2 24  19 - 32 (mEq/L)    Glucose, Bld 222 (*) 70 - 99 (mg/dL)    BUN 7  6 - 23 (mg/dL)    Creatinine, Ser 1.61  0.50 - 1.35 (mg/dL)    Calcium 9.7  8.4 - 10.5 (mg/dL)    Total Protein 7.7  6.0 - 8.3 (g/dL)    Albumin 3.6  3.5 - 5.2 (g/dL)    AST 12  0 - 37 (U/L) HEMOLYSIS AT THIS LEVEL MAY AFFECT RESULT   ALT 8  0 - 53 (U/L)    Alkaline Phosphatase 78  39 - 117 (U/L)    Total Bilirubin 0.3  0.3 - 1.2 (mg/dL)    GFR calc non Af Amer >90  >90 (mL/min)    GFR calc Af Amer >90  >90 (mL/min)   LIPASE, BLOOD     Status: Normal   Collection Time   09/21/11 10:59 AM      Component Value Range Comment   Lipase 32  11 - 59 (U/L)   GLUCOSE, CAPILLARY     Status: Abnormal   Collection Time   09/21/11 11:14 AM      Component Value Range Comment   Glucose-Capillary 224 (*) 70 - 99 (mg/dL)   URINALYSIS, ROUTINE W REFLEX MICROSCOPIC     Status: Abnormal   Collection Time   09/21/11  1:04 PM      Component Value Range Comment   Color, Urine YELLOW  YELLOW     APPearance TURBID (*) CLEAR     Specific Gravity, Urine 1.024  1.005 - 1.030     pH 7.5  5.0 - 8.0     Glucose, UA NEGATIVE  NEGATIVE (mg/dL)    Hgb urine  dipstick NEGATIVE  NEGATIVE     Bilirubin Urine NEGATIVE  NEGATIVE     Ketones, ur 15 (*) NEGATIVE (mg/dL)    Protein, ur 30 (*) NEGATIVE (mg/dL)    Urobilinogen, UA 0.2  0.0 - 1.0 (mg/dL)    Nitrite NEGATIVE  NEGATIVE     Leukocytes, UA TRACE (*) NEGATIVE    URINE MICROSCOPIC-ADD ON     Status: Normal   Collection Time   09/21/11  1:04 PM      Component Value Range Comment   Squamous Epithelial / LPF RARE  RARE     WBC, UA 0-2  <3 (WBC/hpf)    Urine-Other AMORPHOUS URATES/PHOSPHATES     MRSA PCR SCREENING     Status: Normal   Collection Time   09/21/11  8:16 PM      Component Value Range Comment   MRSA by PCR NEGATIVE  NEGATIVE    CBC     Status: Normal   Collection Time   09/21/11  8:42 PM      Component Value Range Comment   WBC 5.9  4.0 - 10.5 (K/uL)    RBC 4.74  4.22 - 5.81 (MIL/uL)    Hemoglobin 14.6  13.0 - 17.0 (g/dL)    HCT 09.6  04.5 - 40.9 (%)    MCV 94.7  78.0 - 100.0 (fL)    MCH 30.8  26.0 - 34.0 (pg)    MCHC 32.5  30.0 - 36.0 (g/dL)    RDW 81.1  91.4 - 78.2 (%)    Platelets 218  150 - 400 (K/uL)   CREATININE, SERUM     Status: Normal   Collection Time   09/21/11  8:42 PM      Component Value  Range Comment   Creatinine, Ser 0.69  0.50 - 1.35 (mg/dL)    GFR calc non Af Amer >90  >90 (mL/min)    GFR calc Af Amer >90  >90 (mL/min)   GLUCOSE, CAPILLARY     Status: Abnormal   Collection Time   09/21/11  8:52 PM      Component Value Range Comment   Glucose-Capillary 189 (*) 70 - 99 (mg/dL)    Comment 1 Notify RN     COMPREHENSIVE METABOLIC PANEL     Status: Abnormal   Collection Time   09/22/11  6:46 AM      Component Value Range Comment   Sodium 134 (*) 135 - 145 (mEq/L)    Potassium 3.8  3.5 - 5.1 (mEq/L)    Chloride 109  96 - 112 (mEq/L)    CO2 21  19 - 32 (mEq/L)    Glucose, Bld 164 (*) 70 - 99 (mg/dL)    BUN 9  6 - 23 (mg/dL)    Creatinine, Ser 1.61  0.50 - 1.35 (mg/dL)    Calcium 8.8  8.4 - 10.5 (mg/dL)    Total Protein 6.7  6.0 - 8.3 (g/dL)    Albumin  3.1 (*) 3.5 - 5.2 (g/dL)    AST 11  0 - 37 (U/L) HEMOLYSIS AT THIS LEVEL MAY AFFECT RESULT   ALT 7  0 - 53 (U/L)    Alkaline Phosphatase 70  39 - 117 (U/L)    Total Bilirubin 0.3  0.3 - 1.2 (mg/dL)    GFR calc non Af Amer >90  >90 (mL/min)    GFR calc Af Amer >90  >90 (mL/min)   CBC     Status: Normal   Collection Time   09/22/11  6:46 AM      Component Value Range Comment   WBC 4.4  4.0 - 10.5 (K/uL)    RBC 4.58  4.22 - 5.81 (MIL/uL)    Hemoglobin 14.0  13.0 - 17.0 (g/dL)    HCT 09.6  04.5 - 40.9 (%)    MCV 95.6  78.0 - 100.0 (fL)    MCH 30.6  26.0 - 34.0 (pg)    MCHC 32.0  30.0 - 36.0 (g/dL)    RDW 81.1  91.4 - 78.2 (%)    Platelets 169  150 - 400 (K/uL)   PROTIME-INR     Status: Normal   Collection Time   09/22/11  6:46 AM      Component Value Range Comment   Prothrombin Time 13.1  11.6 - 15.2 (seconds)    INR 0.97  0.00 - 1.49    GLUCOSE, CAPILLARY     Status: Abnormal   Collection Time   09/22/11  7:59 AM      Component Value Range Comment   Glucose-Capillary 161 (*) 70 - 99 (mg/dL)   GLUCOSE, CAPILLARY     Status: Abnormal   Collection Time   09/22/11 12:32 PM      Component Value Range Comment   Glucose-Capillary 153 (*) 70 - 99 (mg/dL)    Ct Abdomen Pelvis W Contrast  09/21/2011  *RADIOLOGY REPORT*  Clinical Data: Persistent vomiting and abdominal pain for a week.  CT ABDOMEN AND PELVIS WITH CONTRAST  Technique:  Multidetector CT imaging of the abdomen and pelvis was performed following the standard protocol during bolus administration of intravenous contrast.  Contrast: OMNIPAQUE IOHEXOL 300 MG/ML  SOLN  Comparison: 08/22/2011  Findings: Visualization of the lung bases is  limited due to respiratory motion artifact but there appears to be patchy infiltration bilaterally.  The enteric tube with tip in the stomach.  Surgical absence of the gallbladder.  2.4 cm diameter hypodense nodule in the right adrenal gland is stable since the previous study and likely represents an  adenoma.  This has been present on previous studies dating back to 11/19/2009.  The liver, spleen, pancreas, bile ducts, left adrenal gland, abdominal aorta, and retroperitoneal lymph nodes are unremarkable. There are small sub centimeter low attenuation lesions in the left kidney likely representing cysts.  There is a somewhat amorphous area of low attenuation in the medial aspect of the left kidney measuring about 2.4 x 2.8 cm.  This has been present on previous studies but appears to be more prominent today.  While this may represent a cyst, while this may represent a cyst, a solid lesion is not excluded.  Consider ultrasound for further evaluation.  The stomach and small bowel are not distended.  Contrast material extends to the colon.  No colonic distension or wall thickening. No free air or free fluid in the abdomen.  Small umbilical hernia. Focal areas of subcutaneous emphysema along the right anterior abdominal wall likely representing injection site.  Pelvis:  Calcification in the prostate gland with mild prostatic enlargement.  The bladder wall is not thickened.  No inflammatory changes in the sigmoid colon.  The appendix is normal.  No significant lymphadenopathy in the pelvis.  No free or loculated pelvic fluid collections.  Postoperative changes in the lumbar spine with posterior rod and screw fixation and intervertebral fusion of L4-L5.  Scarring in the posterior paraspinal soft tissues consistent with postoperative change.  IMPRESSION: No focal acute process demonstrated in the abdomen or pelvis. Stable appearance of right adrenal gland nodule, most likely an adenoma.  Indeterminate lesion in the medial inferior aspect of the left kidney.  Ultrasound examination is recommended to differentiate cystic or solid lesion.  Small umbilical hernia. Enlarged prostate gland with calcification.  Postoperative changes in the lumbar spine.  Cholecystectomy.  Original Report Authenticated By: Marlon Pel,  M.D.   Dg Abd Acute W/chest  09/21/2011  *RADIOLOGY REPORT*  Clinical Data: Mid to lower abdominal pain for 1 week.  Nausea, vomiting.  Right red blood in vomitus.  Diarrhea.  History of pancreatitis.  History of small bowel obstruction.  ACUTE ABDOMEN SERIES (ABDOMEN 2 VIEW & CHEST 1 VIEW)  Comparison: 09/18/2011  Findings: The heart is normal in size.  There is prominence of interstitial markings especially at the bases, raising the question of interstitial infiltrates.  No pleural effusions are identified. No free intraperitoneal air.  There is dilatation of a small bowel loop within the left mid abdomen, consistent with partial or early small bowel obstruction.  Large bowel is not dilated.  Patient has had prior lower lumbar spine surgery.  Surgical clips are identified within the right upper quadrant of the abdomen.  IMPRESSION:  1. New bilateral lower lobe interstitial infiltrates.  Question of aspiration. 2.  Partial or early small bowel obstruction. 3.  No free intraperitoneal air.  Original Report Authenticated By: Patterson Hammersmith, M.D.   Review of Systems  Constitutional: Negative for fever, chills, weight loss, malaise/fatigue and diaphoresis.  HENT: Negative for hearing loss, ear pain, nosebleeds, congestion, tinnitus and ear discharge.   Eyes: Negative for blurred vision, double vision, photophobia, pain, discharge and redness.  Respiratory: Negative for stridor.   Cardiovascular: Negative.   Gastrointestinal: Positive for  nausea, vomiting and abdominal pain. Negative for heartburn, diarrhea, constipation, blood in stool and melena.  Genitourinary: Negative.   Musculoskeletal: Negative.   Skin: Negative for rash.  Neurological: Negative.  Negative for headaches.  Endo/Heme/Allergies: Negative.   Psychiatric/Behavioral: Negative.    Blood pressure 184/103, pulse 65, temperature 98 F (36.7 C), temperature source Oral, resp. rate 18, height 6\' 6"  (1.981 m), weight 139 kg (306 lb 7  oz), SpO2 93.00%. Physical Exam  Constitutional: He is oriented to person, place, and time. He appears well-developed and well-nourished.  HENT:  Head: Normocephalic and atraumatic.  Eyes: Conjunctivae and EOM are normal. Pupils are equal, round, and reactive to light.  Neck: Normal range of motion. Neck supple.  Cardiovascular: Normal rate and regular rhythm.   Respiratory: Effort normal and breath sounds normal.  GI: Soft. Bowel sounds are normal. He exhibits no distension and no mass. There is tenderness. There is guarding.  Musculoskeletal: Normal range of motion.  Neurological: He is alert and oriented to person, place, and time. He has normal reflexes.  Skin: Skin is warm and dry.  Psychiatric: He has a normal mood and affect. His behavior is normal. Judgment and thought content normal.   Assessment/Plan: Abdominal pain of unclear etiology; Proceed with an EGD at this time. See problem list for other details on his co-morbidities.  Xavier Fournier 09/22/2011, 12:56 PM

## 2011-09-22 NOTE — Op Note (Signed)
Eligha Bridegroom [facility] [address] [place], [place]  [address]  OPERATIVE PROCEDURE REPORT  PATIENT:  Ryan [name], Ryan [name]  MR#:  [id number] BIRTHDATE:  [birth year]  GENDER:  male ENDOSCOPIST:  Dr. Lorenza Burton, MD ASSISTANT:  Claudie Revering, RN CGRN & Kizzie Bane, technician. PROCEDURE DATE:  09/22/2011 PRE-PROCEDURE PREPERATION:  Patient fasted for 4 hours prior to procedure. PRE-PROCEDURE PHYSICAL:  Patient has stable vital signs. Neck is supple. There is no JVD, thyromegaly or LAD. Chest clear to auscultation. S1 and S2 regular. Abdomen soft, obese, non-distended with epigastric tenderness on palpation with NABS. PROCEDURE:  EGD with multiple cold biopsies. ASA CLASS:  Class III INDICATIONS:  1) Epigastric pain 2) Nausea and vomiting. MEDICATIONS:  Fentanyl 100 mcg, Versed 6 mg TOPICAL ANESTHETIC:  Viscous xylocaine-10 cc PO.  DESCRIPTION OF PROCEDURE:   After the risks benefits and alternatives of the procedure were thoroughly explained, informed consent was obtained.  The Pentax Gastroscope [id number] was introduced through the mouth and advanced to the second portion of the duodenum, without limitations. The instrument was slowly withdrawn as the mucosa was fully examined. <<PROCEDUREIMAGES>>  Injury from NGT noted in the mid-esophagus. There was evidence of erosive esophagitis and the distal esophagus with an ulcerated mass at the GEJ-visualzed on high retrofexion that was biopsied for pathology. A prominent antral fold was also noted-this was biopsied for pathology as well. A polypoid lesion was noted on the ampulla but this was not biopsied. The rest of the stomach and the proximal small bowel appeared normal. There were no masses or polyps noted. Retroflexed views revealed no evidence of a hiatal hernia. The scope was then withdrawn from the patient and the procedure terminated. The patient tolerated the procedure without immediate  complications.  IMPRESSION:  1) Erosive esophagitis-distal esophagus. 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 3) Prominent antral fold-biopsied. 4) Polypoid lesion on ?ampulla-not biopsied.  RECOMMENDATIONS:  1) Anti-reflux regimen to be followed. 2) Avoid NSAIDS for now. 3) Await pathology results. 4) Continue PPI's. 5) Clear liquid diet. 6) ?EUS at a later date.  REPEAT EXAM:  None plannned for now.  DISCHARGE INSTRUCTIONS: Standard discharge instructions given.  ______________________________ Dr. Lorenza Burton, MD  CPT CODES: 62130  DIAGNOSIS CODES:  789.06, 787.01  CC:  Barbette Hair. Arlyce Dice, M.D.  n. eSIGNED:   Dr. Lorenza Burton at 09/22/2011 02:11 PM  Viann Fish, [phone]

## 2011-09-22 NOTE — Progress Notes (Signed)
PATIENT DETAILS Name: Ryan [name] Age: 61 y.o. Sex: male Date of Birth: [birth year] Admit Date: 09/21/2011 WUJ:[id number],NFAOZH, MD, MD  Subjective: Still with abdominal pain No further vomiting Not much output from NGT  Objective: Vital signs in last 24 hours: Filed Vitals:   09/21/11 1715 09/21/11 1929 09/21/11 2055 09/22/11 0528  BP: 160/65 170/102 146/75 153/80  Pulse: 58 60 62 72  Temp:  98.5 F (36.9 C) 98.4 F (36.9 C) 97.7 F (36.5 C)  TempSrc:  Oral Oral Oral  Resp:  18 18 18   Height:  6\' 6"  (1.981 m)    Weight:  139 kg (306 lb 7 oz)    SpO2: 97% 100% 97% 90%    Weight change:   Body mass index is 35.41 kg/(m^2).  Intake/Output from previous day:  Intake/Output Summary (Last 24 hours) at 09/22/11 1227 Last data filed at 09/22/11 0600  Gross per 24 hour  Intake 1235.42 ml  Output    300 ml  Net 935.42 ml    PHYSICAL EXAM: Gen Exam: Awake and alert with clear speech.   Neck: Supple, No JVD.   Chest: B/L Clear.   CVS: S1 S2 Regular, no murmurs.  Abdomen: soft, BS +, non tender, non distended.  Extremities: no edema, lower extremities warm to touch. Neurologic: Non Focal.   Skin: No Rash.   Wounds: N/A.    CONSULTS:  None  LAB RESULTS: CBC  Lab 09/22/11 0646 09/21/11 2042 09/21/11 1059 09/18/11 1213  WBC 4.4 5.9 4.9 5.3  HGB 14.0 14.6 14.8 13.4  HCT 43.8 44.9 45.3 40.7  PLT 169 218 247 258  MCV 95.6 94.7 93.4 93.3  MCH 30.6 30.8 30.5 30.7  MCHC 32.0 32.5 32.7 32.9  RDW 14.0 13.9 13.8 14.0  LYMPHSABS -- -- 1.8 1.7  MONOABS -- -- 0.7 0.4  EOSABS -- -- 0.0 0.1  BASOSABS -- -- 0.0 0.0  BANDABS -- -- -- --    Chemistries   Lab 09/22/11 0646 09/21/11 2042 09/21/11 1059 09/18/11 1213  NA 134* -- 138 138  K 3.8 -- 4.0 4.2  CL 109 -- 103 103  CO2 21 -- 24 27  GLUCOSE 164* -- 222* 295*  BUN 9 -- 7 10  CREATININE 0.60 0.69 0.62 0.65  CALCIUM 8.8 -- 9.7 9.4  MG -- -- -- --    GFR Estimated Creatinine Clearance: 153.3 ml/min (by  C-G formula based on Cr of 0.6).  Coagulation profile  Lab 09/22/11 0646  INR 0.97  PROTIME --    Cardiac Enzymes No results found for this basename: CK:3,CKMB:3,TROPONINI:3,MYOGLOBIN:3 in the last 168 hours  No components found with this basename: POCBNP:3 No results found for this basename: DDIMER:2 in the last 72 hours No results found for this basename: HGBA1C:2 in the last 72 hours No results found for this basename: CHOL:2,HDL:2,LDLCALC:2,TRIG:2,CHOLHDL:2,LDLDIRECT:2 in the last 72 hours No results found for this basename: TSH,T4TOTAL,FREET3,T3FREE,THYROIDAB in the last 72 hours No results found for this basename: VITAMINB12:2,FOLATE:2,FERRITIN:2,TIBC:2,IRON:2,RETICCTPCT:2 in the last 72 hours  Basename 09/21/11 1059  LIPASE 32  AMYLASE --    Urine Studies No results found for this basename: UACOL:2,UAPR:2,USPG:2,UPH:2,UTP:2,UGL:2,UKET:2,UBIL:2,UHGB:2,UNIT:2,UROB:2,ULEU:2,UEPI:2,UWBC:2,URBC:2,UBAC:2,CAST:2,CRYS:2,UCOM:2,BILUA:2 in the last 72 hours  MICROBIOLOGY: Recent Results (from the past 240 hour(s))  MRSA PCR SCREENING     Status: Normal   Collection Time   09/21/11  8:16 PM      Component Value Range Status Comment   MRSA by PCR NEGATIVE  NEGATIVE  Final     RADIOLOGY STUDIES/RESULTS:  Ct Abdomen Pelvis W Contrast  09/21/2011  *RADIOLOGY REPORT*  Clinical Data: Persistent vomiting and abdominal pain for a week.  CT ABDOMEN AND PELVIS WITH CONTRAST  Technique:  Multidetector CT imaging of the abdomen and pelvis was performed following the standard protocol during bolus administration of intravenous contrast.  Contrast: OMNIPAQUE IOHEXOL 300 MG/ML  SOLN  Comparison: 08/22/2011  Findings: Visualization of the lung bases is limited due to respiratory motion artifact but there appears to be patchy infiltration bilaterally.  The enteric tube with tip in the stomach.  Surgical absence of the gallbladder.  2.4 cm diameter hypodense nodule in the right adrenal gland is  stable since the previous study and likely represents an adenoma.  This has been present on previous studies dating back to 11/19/2009.  The liver, spleen, pancreas, bile ducts, left adrenal gland, abdominal aorta, and retroperitoneal lymph nodes are unremarkable. There are small sub centimeter low attenuation lesions in the left kidney likely representing cysts.  There is a somewhat amorphous area of low attenuation in the medial aspect of the left kidney measuring about 2.4 x 2.8 cm.  This has been present on previous studies but appears to be more prominent today.  While this may represent a cyst, while this may represent a cyst, a solid lesion is not excluded.  Consider ultrasound for further evaluation.  The stomach and small bowel are not distended.  Contrast material extends to the colon.  No colonic distension or wall thickening. No free air or free fluid in the abdomen.  Small umbilical hernia. Focal areas of subcutaneous emphysema along the right anterior abdominal wall likely representing injection site.  Pelvis:  Calcification in the prostate gland with mild prostatic enlargement.  The bladder wall is not thickened.  No inflammatory changes in the sigmoid colon.  The appendix is normal.  No significant lymphadenopathy in the pelvis.  No free or loculated pelvic fluid collections.  Postoperative changes in the lumbar spine with posterior rod and screw fixation and intervertebral fusion of L4-L5.  Scarring in the posterior paraspinal soft tissues consistent with postoperative change.  IMPRESSION: No focal acute process demonstrated in the abdomen or pelvis. Stable appearance of right adrenal gland nodule, most likely an adenoma.  Indeterminate lesion in the medial inferior aspect of the left kidney.  Ultrasound examination is recommended to differentiate cystic or solid lesion.  Small umbilical hernia. Enlarged prostate gland with calcification.  Postoperative changes in the lumbar spine.  Cholecystectomy.   Original Report Authenticated By: Marlon Pel, M.D.   Dg Abd Acute W/chest  09/21/2011  *RADIOLOGY REPORT*  Clinical Data: Mid to lower abdominal pain for 1 week.  Nausea, vomiting.  Right red blood in vomitus.  Diarrhea.  History of pancreatitis.  History of small bowel obstruction.  ACUTE ABDOMEN SERIES (ABDOMEN 2 VIEW & CHEST 1 VIEW)  Comparison: 09/18/2011  Findings: The heart is normal in size.  There is prominence of interstitial markings especially at the bases, raising the question of interstitial infiltrates.  No pleural effusions are identified. No free intraperitoneal air.  There is dilatation of a small bowel loop within the left mid abdomen, consistent with partial or early small bowel obstruction.  Large bowel is not dilated.  Patient has had prior lower lumbar spine surgery.  Surgical clips are identified within the right upper quadrant of the abdomen.  IMPRESSION:  1. New bilateral lower lobe interstitial infiltrates.  Question of aspiration. 2.  Partial or early small bowel obstruction. 3.  No free intraperitoneal air.  Original Report Authenticated By: Patterson Hammersmith, M.D.   Dg Abd Acute W/chest  09/18/2011  *RADIOLOGY REPORT*  Clinical Data: Diffuse abdominal pain with nausea.  ACUTE ABDOMEN SERIES (ABDOMEN 2 VIEW & CHEST 1 VIEW)  Comparison: Chest radiograph 07/17/2011 and abdominal radiograph 08/23/2011.  Findings: Frontal view of the chest shows midline trachea.  Heart size is accentuated by technique.  Mild bibasilar parenchymal densities appear chronic.  Two views of the abdomen show a fair amount of stool in the colon. No small bowel dilatation.  No free air.  IMPRESSION:  Constipation.  Original Report Authenticated By: Reyes Ivan, M.D.    MEDICATIONS: Scheduled Meds:   . sodium chloride  1,000 mL Intravenous Once  . ALPRAZolam  1 mg Oral BID  . enoxaparin  40 mg Subcutaneous Q24H  . insulin aspart  0-9 Units Subcutaneous TID WC  . insulin glargine  8  Units Subcutaneous QHS  . iohexol  20 mL Oral Q1 Hr x 2  . losartan  100 mg Oral Daily  . pantoprazole (PROTONIX) IV  40 mg Intravenous Q24H  . polyethylene glycol  17 g Oral BID  . senna-docusate  1 tablet Oral QHS  . sodium phosphate  1 enema Rectal Once  . DISCONTD: metoCLOPramide (REGLAN) injection  5 mg Intravenous Q8H  . DISCONTD: metoCLOPramide (REGLAN) injection  5 mg Intravenous Q8H  . DISCONTD: sennosides-docusate sodium  1 tablet Oral Daily   Continuous Infusions:   . sodium chloride 125 mL/hr at 09/21/11 2221  . DISCONTD: sodium chloride 1,000 mL (09/21/11 1332)   PRN Meds:.acetaminophen, acetaminophen, albuterol, HYDROmorphone, HYDROmorphone, HYDROmorphone, HYDROmorphone (DILAUDID) injection, iohexol, ondansetron (ZOFRAN) IV, ondansetron, DISCONTD:  HYDROmorphone (DILAUDID) injection, DISCONTD:  HYDROmorphone (DILAUDID) injection, DISCONTD: oxycodone  Antibiotics: Anti-infectives    None      Assessment/Plan: Patient Active Hospital Problem List: Abdominal pain -recurrent-etiology unclear -multiple ED visits and admission for similar complaints -CT Abd/pelvis on 5/25-no acute abnormality-no bowel obstruction -will remove NGT -Given recurrent admits-will see if we could do a EGD this admit-spoke with Dr Loreta Ave, asked to keep NPO. If EGD unremarkable-will get a Gastric Emptying Scan -in interim continue with narcotics, supportive care for now -once starts eating-will stop IV Dilaudid  HTN -Controlled with Losartan  Constipation -miralax/senokot -as needed fleet enema  DIABETES MELLITUS, TYPE II  CBG's stable Continue with SSI, low dose Lantus-increase back to usual dose if and when eating regular diet  GERD  -PPI  Anxiety -continue Xanax  Disposition: Remain inpatient   DVT Prophylaxis: Prophylactic Lovenox  Code Status: Full Code  Maretta Bees, MD. 09/22/2011, 12:27 PM

## 2011-09-23 DIAGNOSIS — IMO0001 Reserved for inherently not codable concepts without codable children: Secondary | ICD-10-CM

## 2011-09-23 DIAGNOSIS — E1165 Type 2 diabetes mellitus with hyperglycemia: Secondary | ICD-10-CM

## 2011-09-23 DIAGNOSIS — K219 Gastro-esophageal reflux disease without esophagitis: Secondary | ICD-10-CM

## 2011-09-23 DIAGNOSIS — I10 Essential (primary) hypertension: Secondary | ICD-10-CM

## 2011-09-23 DIAGNOSIS — R1013 Epigastric pain: Secondary | ICD-10-CM

## 2011-09-23 LAB — GLUCOSE, CAPILLARY
Glucose-Capillary: 177 mg/dL — ABNORMAL HIGH (ref 70–99)
Glucose-Capillary: 188 mg/dL — ABNORMAL HIGH (ref 70–99)
Glucose-Capillary: 170 mg/dL — ABNORMAL HIGH (ref 70–99)

## 2011-09-23 LAB — BASIC METABOLIC PANEL
BUN: 6 mg/dL (ref 6–23)
CO2: 24 mEq/L (ref 19–32)
Chloride: 103 mEq/L (ref 96–112)
GFR calc non Af Amer: >90 mL/min (ref >90)
Glucose, Bld: 189 mg/dL — ABNORMAL HIGH (ref 70–99)
Potassium: 4 mEq/L (ref 3.5–5.1)
Sodium: 135 mEq/L (ref 135–145)

## 2011-09-23 LAB — CBC
HCT: 40 % (ref 39.0–52.0)
Hemoglobin: 13.3 g/dL (ref 13.0–17.0)
MCHC: 33.3 g/dL (ref 30.0–36.0)
RBC: 4.3 MIL/uL (ref 4.22–5.81)

## 2011-09-23 MED ORDER — OXYCODONE HCL 5 MG PO TABS
30.0000 mg | ORAL_TABLET | ORAL | Status: DC | PRN
  Administered 2011-09-23 – 2011-09-25 (×12): 30 mg via ORAL
  Filled 2011-09-23 (×13): qty 6

## 2011-09-23 MED ORDER — HYDROMORPHONE HCL PF 1 MG/ML IJ SOLN
1.0000 mg | INTRAMUSCULAR | Status: DC | PRN
  Administered 2011-09-23 – 2011-09-24 (×5): 1 mg via INTRAVENOUS
  Filled 2011-09-23 (×5): qty 1

## 2011-09-23 NOTE — Progress Notes (Signed)
Subjective: Cross cover [facility] Since I last evaluated the patient, there has been no change in his symptoms. He continues to complain of abdominal pain and feels he is not getting enough "pain medication''. Awaiting results of his gastric biopsies.On BID PPI's.  Objective: Vital signs in last 24 hours: Temp:  [97.7 F (36.5 C)-98.2 F (36.8 C)] 98.2 F (36.8 C) (05/27 1422) Pulse Rate:  [57-88] 71  (05/27 1422) Resp:  [17-18] 18  (05/27 1422) BP: (138-161)/(69-91) 159/80 mmHg (05/27 1422) SpO2:  [92 %-98 %] 98 % (05/27 1422) Last BM Date: 09/23/11  Intake/Output from previous day: 05/26 0701 - 05/27 0700 In: 3375 [P.O.:2100; I.V.:1275] Out: 2525 [Urine:2525] Intake/Output this shift: Total I/O In: 1360 [P.O.:1360] Out: 950 [Urine:950]  General appearance: alert, cooperative, no distress and morbidly obese Resp: clear to auscultation bilaterally Cardio: regular rate and rhythm, S1, S2 normal, no murmur, click, rub or gallop GI: soft, non-tender; bowel sounds normal; no masses,  no organomegaly Extremities: extremities normal, atraumatic, no cyanosis or edema  Lab Results:  Basename 09/23/11 0829 09/22/11 0646 09/21/11 2042  WBC 3.3* 4.4 5.9  HGB 13.3 14.0 14.6  HCT 40.0 43.8 44.9  PLT 194 169 218   BMET  Basename 09/23/11 0829 09/22/11 0646 09/21/11 2042 09/21/11 1059  NA 135 134* -- 138  K 4.0 3.8 -- 4.0  CL 103 109 -- 103  CO2 24 21 -- 24  GLUCOSE 189* 164* -- 222*  BUN 6 9 -- 7  CREATININE 0.59 0.60 0.69 --  CALCIUM 8.9 8.8 -- 9.7   LFT  Basename 09/22/11 0646  PROT 6.7  ALBUMIN 3.1*  AST 11  ALT 7  ALKPHOS 70  BILITOT 0.3  BILIDIR --  IBILI --   PT/INR  Basename 09/22/11 0646  LABPROT 13.1  INR 0.97   Studies/Results: Ct Abdomen Pelvis W Contrast  09/21/2011  *RADIOLOGY REPORT*  Clinical Data: Persistent vomiting and abdominal pain for a week.  CT ABDOMEN AND PELVIS WITH CONTRAST  Technique:  Multidetector CT imaging of the abdomen and pelvis  was performed following the standard protocol during bolus administration of intravenous contrast.  Contrast: OMNIPAQUE IOHEXOL 300 MG/ML  SOLN  Comparison: 08/22/2011  Findings: Visualization of the lung bases is limited due to respiratory motion artifact but there appears to be patchy infiltration bilaterally.  The enteric tube with tip in the stomach.  Surgical absence of the gallbladder.  2.4 cm diameter hypodense nodule in the right adrenal gland is stable since the previous study and likely represents an adenoma.  This has been present on previous studies dating back to 11/19/2009.  The liver, spleen, pancreas, bile ducts, left adrenal gland, abdominal aorta, and retroperitoneal lymph nodes are unremarkable. There are small sub centimeter low attenuation lesions in the left kidney likely representing cysts.  There is a somewhat amorphous area of low attenuation in the medial aspect of the left kidney measuring about 2.4 x 2.8 cm.  This has been present on previous studies but appears to be more prominent today.  While this may represent a cyst, while this may represent a cyst, a solid lesion is not excluded.  Consider ultrasound for further evaluation.  The stomach and small bowel are not distended.  Contrast material extends to the colon.  No colonic distension or wall thickening. No free air or free fluid in the abdomen.  Small umbilical hernia. Focal areas of subcutaneous emphysema along the right anterior abdominal wall likely representing injection site.  Pelvis:  Calcification in the prostate gland with mild prostatic enlargement.  The bladder wall is not thickened.  No inflammatory changes in the sigmoid colon.  The appendix is normal.  No significant lymphadenopathy in the pelvis.  No free or loculated pelvic fluid collections.  Postoperative changes in the lumbar spine with posterior rod and screw fixation and intervertebral fusion of L4-L5.  Scarring in the posterior paraspinal soft tissues  consistent with postoperative change.  IMPRESSION: No focal acute process demonstrated in the abdomen or pelvis. Stable appearance of right adrenal gland nodule, most likely an adenoma.  Indeterminate lesion in the medial inferior aspect of the left kidney.  Ultrasound examination is recommended to differentiate cystic or solid lesion.  Small umbilical hernia. Enlarged prostate gland with calcification.  Postoperative changes in the lumbar spine.  Cholecystectomy.  Original Report Authenticated By: Marlon Pel, M.D.    Medications: I have reviewed the patient's current medications.  Assessment/Plan: Epigastric pain with an ulcerated fold at the GEJ-Await pathology results. Agree with gradually decreasing his narcotic dosage.   LOS: 2 days   Dao Mearns 09/23/2011, 4:15 PM

## 2011-09-23 NOTE — Progress Notes (Signed)
PATIENT DETAILS Name: CAIDYN HENRICKSEN Age: 61 y.o. Sex: male Date of Birth: 1950/07/06 Admit Date: 09/21/2011 ZOX:[id number],WJXBJY, MD, MD  Subjective: Still with abdominal pain No further vomiting  Objective: Vital signs in last 24 hours: Filed Vitals:   09/22/11 2023 09/23/11 0538 09/23/11 0549 09/23/11 1422  BP: 138/74 161/91 147/69 159/80  Pulse: 62 57 88 71  Temp: 97.7 F (36.5 C) 97.9 F (36.6 C)  98.2 F (36.8 C)  TempSrc: Oral Oral    Resp: 17 17  18   Height:      Weight:      SpO2: 92% 97%  98%    Weight change:   Body mass index is 35.41 kg/(m^2).  Intake/Output from previous day:  Intake/Output Summary (Last 24 hours) at 09/23/11 1423 Last data filed at 09/23/11 7829  Gross per 24 hour  Intake   3335 ml  Output   3125 ml  Net    210 ml    PHYSICAL EXAM: Gen Exam: Awake and alert with clear speech.   Neck: Supple, No JVD.   Chest: B/L Clear.   CVS: S1 S2 Regular, no murmurs.  Abdomen: soft, BS +, non tender, non distended.  Extremities: no edema, lower extremities warm to touch. Neurologic: Non Focal.   Skin: No Rash.   Wounds: N/A.    CONSULTS:  None  LAB RESULTS: CBC  Lab 09/23/11 0829 09/22/11 0646 09/21/11 2042 09/21/11 1059 09/18/11 1213  WBC 3.3* 4.4 5.9 4.9 5.3  HGB 13.3 14.0 14.6 14.8 13.4  HCT 40.0 43.8 44.9 45.3 40.7  PLT 194 169 218 247 258  MCV 93.0 95.6 94.7 93.4 93.3  MCH 30.9 30.6 30.8 30.5 30.7  MCHC 33.3 32.0 32.5 32.7 32.9  RDW 13.6 14.0 13.9 13.8 14.0  LYMPHSABS -- -- -- 1.8 1.7  MONOABS -- -- -- 0.7 0.4  EOSABS -- -- -- 0.0 0.1  BASOSABS -- -- -- 0.0 0.0  BANDABS -- -- -- -- --    Chemistries   Lab 09/23/11 0829 09/22/11 0646 09/21/11 2042 09/21/11 1059 09/18/11 1213  NA 135 134* -- 138 138  K 4.0 3.8 -- 4.0 4.2  CL 103 109 -- 103 103  CO2 24 21 -- 24 27  GLUCOSE 189* 164* -- 222* 295*  BUN 6 9 -- 7 10  CREATININE 0.59 0.60 0.69 0.62 0.65  CALCIUM 8.9 8.8 -- 9.7 9.4  MG -- -- -- -- --     GFR Estimated Creatinine Clearance: 153.3 ml/min (by C-G formula based on Cr of 0.59).  Coagulation profile  Lab 09/22/11 0646  INR 0.97  PROTIME --    Cardiac Enzymes No results found for this basename: CK:3,CKMB:3,TROPONINI:3,MYOGLOBIN:3 in the last 168 hours  No components found with this basename: POCBNP:3 No results found for this basename: DDIMER:2 in the last 72 hours No results found for this basename: HGBA1C:2 in the last 72 hours No results found for this basename: CHOL:2,HDL:2,LDLCALC:2,TRIG:2,CHOLHDL:2,LDLDIRECT:2 in the last 72 hours No results found for this basename: TSH,T4TOTAL,FREET3,T3FREE,THYROIDAB in the last 72 hours No results found for this basename: VITAMINB12:2,FOLATE:2,FERRITIN:2,TIBC:2,IRON:2,RETICCTPCT:2 in the last 72 hours  Basename 09/21/11 1059  LIPASE 32  AMYLASE --    Urine Studies No results found for this basename: UACOL:2,UAPR:2,USPG:2,UPH:2,UTP:2,UGL:2,UKET:2,UBIL:2,UHGB:2,UNIT:2,UROB:2,ULEU:2,UEPI:2,UWBC:2,URBC:2,UBAC:2,CAST:2,CRYS:2,UCOM:2,BILUA:2 in the last 72 hours  MICROBIOLOGY: Recent Results (from the past 240 hour(s))  MRSA PCR SCREENING     Status: Normal   Collection Time   09/21/11  8:16 PM      Component Value Range Status  Comment   MRSA by PCR NEGATIVE  NEGATIVE  Final     RADIOLOGY STUDIES/RESULTS: Ct Abdomen Pelvis W Contrast  09/21/2011  *RADIOLOGY REPORT*  Clinical Data: Persistent vomiting and abdominal pain for a week.  CT ABDOMEN AND PELVIS WITH CONTRAST  Technique:  Multidetector CT imaging of the abdomen and pelvis was performed following the standard protocol during bolus administration of intravenous contrast.  Contrast: OMNIPAQUE IOHEXOL 300 MG/ML  SOLN  Comparison: 08/22/2011  Findings: Visualization of the lung bases is limited due to respiratory motion artifact but there appears to be patchy infiltration bilaterally.  The enteric tube with tip in the stomach.  Surgical absence of the gallbladder.  2.4  cm diameter hypodense nodule in the right adrenal gland is stable since the previous study and likely represents an adenoma.  This has been present on previous studies dating back to 11/19/2009.  The liver, spleen, pancreas, bile ducts, left adrenal gland, abdominal aorta, and retroperitoneal lymph nodes are unremarkable. There are small sub centimeter low attenuation lesions in the left kidney likely representing cysts.  There is a somewhat amorphous area of low attenuation in the medial aspect of the left kidney measuring about 2.4 x 2.8 cm.  This has been present on previous studies but appears to be more prominent today.  While this may represent a cyst, while this may represent a cyst, a solid lesion is not excluded.  Consider ultrasound for further evaluation.  The stomach and small bowel are not distended.  Contrast material extends to the colon.  No colonic distension or wall thickening. No free air or free fluid in the abdomen.  Small umbilical hernia. Focal areas of subcutaneous emphysema along the right anterior abdominal wall likely representing injection site.  Pelvis:  Calcification in the prostate gland with mild prostatic enlargement.  The bladder wall is not thickened.  No inflammatory changes in the sigmoid colon.  The appendix is normal.  No significant lymphadenopathy in the pelvis.  No free or loculated pelvic fluid collections.  Postoperative changes in the lumbar spine with posterior rod and screw fixation and intervertebral fusion of L4-L5.  Scarring in the posterior paraspinal soft tissues consistent with postoperative change.  IMPRESSION: No focal acute process demonstrated in the abdomen or pelvis. Stable appearance of right adrenal gland nodule, most likely an adenoma.  Indeterminate lesion in the medial inferior aspect of the left kidney.  Ultrasound examination is recommended to differentiate cystic or solid lesion.  Small umbilical hernia. Enlarged prostate gland with calcification.   Postoperative changes in the lumbar spine.  Cholecystectomy.  Original Report Authenticated By: Marlon Pel, M.D.   Dg Abd Acute W/chest  09/21/2011  *RADIOLOGY REPORT*  Clinical Data: Mid to lower abdominal pain for 1 week.  Nausea, vomiting.  Right red blood in vomitus.  Diarrhea.  History of pancreatitis.  History of small bowel obstruction.  ACUTE ABDOMEN SERIES (ABDOMEN 2 VIEW & CHEST 1 VIEW)  Comparison: 09/18/2011  Findings: The heart is normal in size.  There is prominence of interstitial markings especially at the bases, raising the question of interstitial infiltrates.  No pleural effusions are identified. No free intraperitoneal air.  There is dilatation of a small bowel loop within the left mid abdomen, consistent with partial or early small bowel obstruction.  Large bowel is not dilated.  Patient has had prior lower lumbar spine surgery.  Surgical clips are identified within the right upper quadrant of the abdomen.  IMPRESSION:  1. New bilateral lower  lobe interstitial infiltrates.  Question of aspiration. 2.  Partial or early small bowel obstruction. 3.  No free intraperitoneal air.  Original Report Authenticated By: Patterson Hammersmith, M.D.   Dg Abd Acute W/chest  09/18/2011  *RADIOLOGY REPORT*  Clinical Data: Diffuse abdominal pain with nausea.  ACUTE ABDOMEN SERIES (ABDOMEN 2 VIEW & CHEST 1 VIEW)  Comparison: Chest radiograph 07/17/2011 and abdominal radiograph 08/23/2011.  Findings: Frontal view of the chest shows midline trachea.  Heart size is accentuated by technique.  Mild bibasilar parenchymal densities appear chronic.  Two views of the abdomen show a fair amount of stool in the colon. No small bowel dilatation.  No free air.  IMPRESSION:  Constipation.  Original Report Authenticated By: Reyes Ivan, M.D.    MEDICATIONS: Scheduled Meds:    . ALPRAZolam  1 mg Oral BID  . enoxaparin  40 mg Subcutaneous Q24H  . insulin aspart  0-9 Units Subcutaneous TID WC  . insulin  glargine  8 Units Subcutaneous QHS  . losartan  100 mg Oral Daily  . pantoprazole (PROTONIX) IV  40 mg Intravenous Q12H  . polyethylene glycol  17 g Oral BID  . senna-docusate  1 tablet Oral QHS  . sodium phosphate  1 enema Rectal Once  . DISCONTD: pantoprazole (PROTONIX) IV  40 mg Intravenous Q24H  . DISCONTD: pantoprazole (PROTONIX) IV  40 mg Intravenous Q12H   Continuous Infusions:    . sodium chloride 75 mL/hr at 09/23/11 0823  . DISCONTD: sodium chloride 100 mL/hr at 09/22/11 1455   PRN Meds:.acetaminophen, acetaminophen, albuterol, fentaNYL, HYDROmorphone (DILAUDID) injection, midazolam, ondansetron (ZOFRAN) IV, ondansetron, oxycodone, DISCONTD:  HYDROmorphone (DILAUDID) injection  Antibiotics: Anti-infectives    None      Assessment/Plan: Patient Active Hospital Problem List: Abdominal pain -claims to have epigastric/umblical pain mostly-pain potentially could be explained by large ulcerated mass in GE junction, also has a polypoid lesion in the ampulla -await biopsy results -decrease Dilaudid today, add back home dosing of oxycodone -Change PPI to BID -having numerous BM-doubt any further issue with Bowel Obstruction at this time -advance to full liquid diet  HTN -Controlled with Losartan  Constipation -better-2/2 chronic narcotics. Reportedly had numerous BM's 5/26 -miralax/senokot -as needed fleet enema  DIABETES MELLITUS, TYPE II  CBG's stable Continue with SSI, increase Lantus to 10 units  GERD  -PPI  Anxiety -continue Xanax  Disposition: Remain inpatient   DVT Prophylaxis: Prophylactic Lovenox  Code Status: Full Code  Maretta Bees, MD. 09/23/2011, 2:23 PM

## 2011-09-24 ENCOUNTER — Encounter (HOSPITAL_COMMUNITY): Payer: Self-pay | Admitting: Gastroenterology

## 2011-09-24 DIAGNOSIS — K219 Gastro-esophageal reflux disease without esophagitis: Secondary | ICD-10-CM

## 2011-09-24 DIAGNOSIS — R109 Unspecified abdominal pain: Secondary | ICD-10-CM

## 2011-09-24 DIAGNOSIS — K259 Gastric ulcer, unspecified as acute or chronic, without hemorrhage or perforation: Secondary | ICD-10-CM

## 2011-09-24 DIAGNOSIS — I10 Essential (primary) hypertension: Secondary | ICD-10-CM

## 2011-09-24 DIAGNOSIS — R1013 Epigastric pain: Secondary | ICD-10-CM

## 2011-09-24 DIAGNOSIS — E1165 Type 2 diabetes mellitus with hyperglycemia: Secondary | ICD-10-CM

## 2011-09-24 LAB — GLUCOSE, CAPILLARY
Glucose-Capillary: 158 mg/dL — ABNORMAL HIGH (ref 70–99)
Glucose-Capillary: 166 mg/dL — ABNORMAL HIGH (ref 70–99)

## 2011-09-24 MED ORDER — POTASSIUM CHLORIDE 20 MEQ/15ML (10%) PO LIQD
40.0000 meq | Freq: Once | ORAL | Status: AC
  Administered 2011-09-24: 40 meq via ORAL
  Filled 2011-09-24 (×2): qty 30

## 2011-09-24 MED ORDER — OXYCODONE HCL 10 MG PO TB12
30.0000 mg | ORAL_TABLET | Freq: Two times a day (BID) | ORAL | Status: DC
  Administered 2011-09-24: 30 mg via ORAL
  Filled 2011-09-24: qty 3

## 2011-09-24 MED ORDER — POLYETHYLENE GLYCOL 3350 17 G PO PACK
17.0000 g | PACK | Freq: Every day | ORAL | Status: DC | PRN
  Filled 2011-09-24: qty 1

## 2011-09-24 MED ORDER — SENNOSIDES-DOCUSATE SODIUM 8.6-50 MG PO TABS: 1.0000 | ORAL_TABLET | Freq: Every evening | ORAL | Status: DC | PRN

## 2011-09-24 MED ORDER — HYDROMORPHONE HCL PF 1 MG/ML IJ SOLN
1.0000 mg | Freq: Once | INTRAMUSCULAR | Status: AC
  Administered 2011-09-24: 1 mg via INTRAVENOUS
  Filled 2011-09-24: qty 1

## 2011-09-24 MED ORDER — PANTOPRAZOLE SODIUM 40 MG PO TBEC
40.0000 mg | DELAYED_RELEASE_TABLET | Freq: Every day | ORAL | Status: DC
  Administered 2011-09-24 – 2011-09-25 (×2): 40 mg via ORAL
  Filled 2011-09-24 (×2): qty 1

## 2011-09-24 MED ORDER — OXYCODONE HCL 15 MG PO TB12
45.0000 mg | ORAL_TABLET | Freq: Two times a day (BID) | ORAL | Status: DC
  Administered 2011-09-24 – 2011-09-25 (×2): 45 mg via ORAL
  Filled 2011-09-24 (×2): qty 3

## 2011-09-24 MED ORDER — SUCRALFATE 1 GM/10ML PO SUSP
1.0000 g | Freq: Three times a day (TID) | ORAL | Status: DC
  Administered 2011-09-24 – 2011-09-25 (×6): 1 g via ORAL
  Filled 2011-09-24 (×13): qty 10

## 2011-09-24 NOTE — Progress Notes (Signed)
Patient ID: Ryan [name], male   DOB: [birth year], 61 y.o.   MRN: [id number]  PATIENT DETAILS Name: Ryan [name] Age: 61 y.o. Sex: male Date of Birth: July 26, 1950 Admit Date: 09/21/2011 AOZ:[id number],[email or web address], MD, MD  Subjective: Abdominal pain unchanged.  Reports 7 - 8 bowel movements yesterday.  Objective: Vital signs in last 24 hours: Filed Vitals:   09/23/11 1422 09/23/11 2229 09/24/11 0531 09/24/11 0628  BP: 159/80 106/67 122/73 130/80  Pulse: 71 64 66 64  Temp: 98.2 F (36.8 C) 97 F (36.1 C) 97.4 F (36.3 C) 97.5 F (36.4 C)  TempSrc:  Oral Oral Oral  Resp: 18 18 17 18   Height:      Weight:      SpO2: 98% 96% 97% 97%    Weight change:   Body mass index is 35.41 kg/(m^2).  Intake/Output from previous day:  Intake/Output Summary (Last 24 hours) at 09/24/11 0959 Last data filed at 09/24/11 0834  Gross per 24 hour  Intake 3801.25 ml  Output   3400 ml  Net 401.25 ml    PHYSICAL EXAM: Gen Exam: Awake and alert with clear speech.  pleasant Neck: Supple, No JVD.   Chest: B/L Clear.   CVS: S1 S2 Regular, no murmurs.  Abdomen: soft, BS +, non tender, non distended.  Extremities: no edema, lower extremities warm to touch. Neurologic: Non Focal.   Wounds: N/A.    CONSULTS: Gastroenterology.     Procedures: 1.  Upper Endoscopy 09/22/2011  LAB RESULTS: CBC  Lab 09/23/11 0829 09/22/11 0646 09/21/11 2042 09/21/11 1059 09/18/11 1213  WBC 3.3* 4.4 5.9 4.9 5.3  HGB 13.3 14.0 14.6 14.8 13.4  HCT 40.0 43.8 44.9 45.3 40.7  PLT 194 169 218 247 258  MCV 93.0 95.6 94.7 93.4 93.3  MCH 30.9 30.6 30.8 30.5 30.7  MCHC 33.3 32.0 32.5 32.7 32.9  RDW 13.6 14.0 13.9 13.8 14.0  LYMPHSABS -- -- -- 1.8 1.7  MONOABS -- -- -- 0.7 0.4  EOSABS -- -- -- 0.0 0.1  BASOSABS -- -- -- 0.0 0.0  BANDABS -- -- -- -- --    Chemistries   Lab 09/23/11 0829 09/22/11 0646 09/21/11 2042 09/21/11 1059 09/18/11 1213  NA 135 134* -- 138 138  K 4.0 3.8 -- 4.0 4.2  CL 103 109 -- 103  103  CO2 24 21 -- 24 27  GLUCOSE 189* 164* -- 222* 295*  BUN 6 9 -- 7 10  CREATININE 0.59 0.60 0.69 0.62 0.65  CALCIUM 8.9 8.8 -- 9.7 9.4  MG -- -- -- -- --    GFR Estimated Creatinine Clearance: 153.3 ml/min (by C-G formula based on Cr of 0.59).  Coagulation profile  Lab 09/22/11 0646  INR 0.97  PROTIME --     Basename 09/21/11 1059  LIPASE 32  AMYLASE --    MICROBIOLOGY: Recent Results (from the past 240 hour(s))  MRSA PCR SCREENING     Status: Normal   Collection Time   09/21/11  8:16 PM      Component Value Range Status Comment   MRSA by PCR NEGATIVE  NEGATIVE  Final     RADIOLOGY STUDIES/RESULTS: Ct Abdomen Pelvis W Contrast  09/21/2011  *RADIOLOGY REPORT*  Clinical Data: Persistent vomiting and abdominal pain for a week.  CT ABDOMEN AND PELVIS WITH CONTRAST  IMPRESSION: No focal acute process demonstrated in the abdomen or pelvis. Stable appearance of right adrenal gland nodule, most likely an adenoma.  Indeterminate lesion in the medial  inferior aspect of the left kidney.  Ultrasound examination is recommended to differentiate cystic or solid lesion.  Small umbilical hernia. Enlarged prostate gland with calcification.  Postoperative changes in the lumbar spine.  Cholecystectomy.  Original Report Authenticated By: Marlon Pel, M.D.   Dg Abd Acute W/chest  09/21/2011  *RADIOLOGY REPORT*  Clinical Data: Mid to lower abdominal pain for 1 week.  Nausea, vomiting.  Right red blood in vomitus.  Diarrhea.  History of pancreatitis.  History of small bowel obstruction.  ACUTE ABDOMEN SERIES (ABDOMEN 2 VIEW & CHEST 1 VIEW)  IMPRESSION:  1. New bilateral lower lobe interstitial infiltrates.  Question of aspiration. 2.  Partial or early small bowel obstruction. 3.  No free intraperitoneal air.  Original Report Authenticated By: Patterson Hammersmith, M.D.   Dg Abd Acute W/chest  09/18/2011  *RADIOLOGY REPORT*  Clinical Data: Diffuse abdominal pain with nausea.  ACUTE ABDOMEN  SERIES (ABDOMEN 2 VIEW & CHEST 1 VIEW)    IMPRESSION:  Constipation.  Original Report Authenticated By: Reyes Ivan, M.D.    MEDICATIONS: Scheduled Meds:    . ALPRAZolam  1 mg Oral BID  . enoxaparin  40 mg Subcutaneous Q24H  . insulin aspart  0-9 Units Subcutaneous TID WC  . insulin glargine  8 Units Subcutaneous QHS  . losartan  100 mg Oral Daily  . oxyCODONE  30 mg Oral Q12H  . pantoprazole  40 mg Oral Q1200  . potassium chloride  40 mEq Oral Once  . sodium phosphate  1 enema Rectal Once  . sucralfate  1 g Oral TID WC & HS  . DISCONTD: pantoprazole (PROTONIX) IV  40 mg Intravenous Q12H  . DISCONTD: polyethylene glycol  17 g Oral BID  . DISCONTD: senna-docusate  1 tablet Oral QHS   Continuous Infusions:    . sodium chloride 20 mL/hr at 09/23/11 1430   PRN Meds:.acetaminophen, acetaminophen, albuterol, fentaNYL, midazolam, ondansetron (ZOFRAN) IV, ondansetron, oxycodone, polyethylene glycol, senna-docusate, DISCONTD:  HYDROmorphone (DILAUDID) injection, DISCONTD:  HYDROmorphone (DILAUDID) injection  Antibiotics: Anti-infectives    None      Assessment/Plan: Patient Active Hospital Problem List: Abdominal pain - epigastric/umblical pain mostly-pain potentially could be explained by large ulcerated mass in GE junction, also has a polypoid lesion in the ampulla -await biopsy results -eliminate Dilaudid today, home dosing of oxycodone on board, will add bid OxyContin at 30 mg. -Change PPI to PO BID -having numerous BM-doubt any further issue with Bowel Obstruction at this time.  Will make stool softeners PRN. -advance to chopped / soft solid diet. -Per Pathology - 1st sample showed ulceration without malignancy.  2nd sample from prominent antral fold will have further immunostaining.  Final results will be ready 09/24/11  - Awaiting GI decision regarding periampullary nodule.  Will he need an EUS?  HTN -Controlled with Losartan  Constipation -better-2/2 chronic  narcotics. Reportedly had numerous BM's 5/26 -miralax/senokot PRN -as needed fleet enema  DIABETES MELLITUS, TYPE II  CBG's stable Continue with SSI, increase Lantus to 10 units  GERD  -PPI  Anxiety -continue Xanax  Disposition: Remain inpatient, pending path results.  Will advance diet and attempt to progress from IV to all PO medications in anticipation of going home soon.  DVT Prophylaxis: Prophylactic Lovenox  Code Status: Full code.  Algis Downs, [place] [facility] Pager: [phone] 10:06 am  09/24/11

## 2011-09-24 NOTE — Progress Notes (Signed)
[facility] Daily Rounding Note 09/24/2011, 12:14 PM  SUBJECTIVE:       No nausea, still pain across upper abdomen.   Tolerated clears and got puree diet for lunch.  He has no hx of dysphagia 4 or so stools yesterday following dose of Senna. Using both Dilaudid and oxycodone regularly  OBJECTIVE:        General: obese  Looks unhealthy     Vital signs in last 24 hours:    Temp:  [97 F (36.1 C)-98.2 F (36.8 C)] 97.5 F (36.4 C) (05/28 0628) Pulse Rate:  [64-71] 64  (05/28 0628) Resp:  [17-18] 18  (05/28 0628) BP: (106-159)/(67-80) 130/80 mmHg (05/28 0628) SpO2:  [96 %-98 %] 97 % (05/28 0628) Last BM Date: 09/23/11  Heart: RRR Chest: greatly diminished BS.  No cough Abdomen: obese , soft, NT, ND  Extremities: no pedal edema Neuro/Psych:  Pleasant.  Talkative. Not confused.  Intake/Output from previous day: 05/27 0701 - 05/28 0700 In: 3801.3 [P.O.:3000; I.V.:801.3] Out: 4000 [Urine:4000]  Intake/Output this shift: Total I/O In: 680 [P.O.:680] Out: 600 [Urine:600]  Lab Results:  Basename 09/23/11 0829 09/22/11 0646 09/21/11 2042  WBC 3.3* 4.4 5.9  HGB 13.3 14.0 14.6  HCT 40.0 43.8 44.9  PLT 194 169 218   BMET  Basename 09/23/11 0829 09/22/11 0646 09/21/11 2042  NA 135 134* --  K 4.0 3.8 --  CL 103 109 --  CO2 24 21 --  GLUCOSE 189* 164* --  BUN 6 9 --  CREATININE 0.59 0.60 0.69  CALCIUM 8.9 8.8 --   LFT  Basename 09/22/11 0646  PROT 6.7  ALBUMIN 3.1*  AST 11  ALT 7  ALKPHOS 70  BILITOT 0.3  BILIDIR --  IBILI --   PT/INR  Basename 09/22/11 0646  LABPROT 13.1  INR 0.97   Radiology Ct abd/pelvis  09/21/11 No focal acute process demonstrated in the abdomen or pelvis.  Stable appearance of right adrenal gland nodule, most likely an  adenoma. Indeterminate lesion in the medial inferior aspect of the  left kidney. Ultrasound examination is recommended to  differentiate cystic or solid lesion. Small umbilical hernia.  Enlarged prostate gland  with calcification. Postoperative changes  in the lumbar spine. Cholecystectomy.   ASSESMENT: *   Epigastric pain with n/v,  acute on chronic.    EGD 5/27:  Erosive esophagitis, ulcerated mass at GEJ, biopsied/path pending. Also biopsy of antral prominent fold.  Polypoid lesion at Ampulla was not biopsied.   On once daily PO Protonix once daily and Carafate liquid TID Lipase in 06/2010 was 129.  Subsequent Lipase measures all consistently normal.  CT scan without pancreas abnormalities, so I am not convinced of the diagnoses of chronic pancreatitis *  DM 2 *  Morbid obesity *  Prostate calcifications, does this need work up? *  Chronic pain, has been seen at [facility] in past.  Chronic narcotics.  *  Constipation, improved.    PLAN: *  Await pathology *  Continue Protonix and Carafate.  *  Diabetic diet.  *  ? EUS to eval ampulla?   LOS: 3 days   Jennye Moccasin  09/24/2011, 12:14 PM Pager: [phone]   GI ATTENDING  PATIENT SEEN AND EXAMINED. ENDOSCOPY FINDINGS DISCUSSED WITH DR [facility] THIS MORNING. PATIENT WITH CHRONIC PAIN ISSUES. REQUESTING MORE NARCOTICS. DEFERRED TO PRIMARY SERVICE. NOT CLEAR HOW MUCH , IF ANY, ENDOSCOPIC FINDINGS ARE RESPONSIBLE FOR C/O PAIN. HE NEEDS BID PPI, NO NSAIDS (ASIDE  FROM ASA), AND RELOOK EGD IN 6-8 WEEKS IF BX ARE NEGATIVE. DISCUSSED WITH PATIENT, WHO VERBALIZES UNDERSTANDING.  Wilhemina Bonito. Eda Keys., M.D. [facility] Division of Gastroenterology

## 2011-09-24 NOTE — Progress Notes (Signed)
I've seen and examined the patient, I agree with the assessment and plan. Awaiting biopsy results and awaiting GI followup

## 2011-09-25 ENCOUNTER — Encounter: Payer: Self-pay | Admitting: Internal Medicine

## 2011-09-25 ENCOUNTER — Encounter: Payer: Self-pay | Admitting: Gastroenterology

## 2011-09-25 DIAGNOSIS — K3189 Other diseases of stomach and duodenum: Secondary | ICD-10-CM | POA: Diagnosis present

## 2011-09-25 DIAGNOSIS — I10 Essential (primary) hypertension: Secondary | ICD-10-CM

## 2011-09-25 DIAGNOSIS — K5903 Drug induced constipation: Secondary | ICD-10-CM | POA: Diagnosis present

## 2011-09-25 DIAGNOSIS — K250 Acute gastric ulcer with hemorrhage: Secondary | ICD-10-CM

## 2011-09-25 DIAGNOSIS — E1165 Type 2 diabetes mellitus with hyperglycemia: Secondary | ICD-10-CM

## 2011-09-25 DIAGNOSIS — K259 Gastric ulcer, unspecified as acute or chronic, without hemorrhage or perforation: Secondary | ICD-10-CM | POA: Diagnosis present

## 2011-09-25 DIAGNOSIS — IMO0001 Reserved for inherently not codable concepts without codable children: Secondary | ICD-10-CM

## 2011-09-25 DIAGNOSIS — K219 Gastro-esophageal reflux disease without esophagitis: Secondary | ICD-10-CM

## 2011-09-25 DIAGNOSIS — F1120 Opioid dependence, uncomplicated: Secondary | ICD-10-CM | POA: Diagnosis present

## 2011-09-25 MED ORDER — GLIPIZIDE ER 10 MG PO TB24: 10.0000 mg | ORAL_TABLET | Freq: Two times a day (BID) | ORAL | Status: DC

## 2011-09-25 MED ORDER — PANTOPRAZOLE SODIUM 40 MG PO TBEC: 40.0000 mg | DELAYED_RELEASE_TABLET | Freq: Every day | ORAL | Status: DC

## 2011-09-25 MED ORDER — INSULIN GLARGINE 100 UNIT/ML ~~LOC~~ SOLN: 10.0000 [IU] | Freq: Every day | SUBCUTANEOUS | Status: DC

## 2011-09-25 MED ORDER — OXYCODONE HCL 30 MG PO TABS: 30.0000 mg | ORAL_TABLET | ORAL | Status: DC | PRN

## 2011-09-25 MED ORDER — ONDANSETRON HCL 4 MG PO TABS: 4.0000 mg | ORAL_TABLET | Freq: Four times a day (QID) | ORAL | Status: DC | PRN

## 2011-09-25 MED ORDER — METOCLOPRAMIDE HCL 5 MG PO TABS
5.0000 mg | ORAL_TABLET | Freq: Three times a day (TID) | ORAL | Status: DC
  Administered 2011-09-25: 5 mg via ORAL
  Filled 2011-09-25 (×3): qty 1

## 2011-09-25 MED ORDER — LOSARTAN POTASSIUM 100 MG PO TABS: 100.0000 mg | ORAL_TABLET | Freq: Every day | ORAL | Status: DC

## 2011-09-25 MED ORDER — GABAPENTIN 400 MG PO CAPS: 400.0000 mg | ORAL_CAPSULE | Freq: Three times a day (TID) | ORAL | Status: DC

## 2011-09-25 MED ORDER — SUCRALFATE 1 GM/10ML PO SUSP: 1.0000 g | Freq: Three times a day (TID) | ORAL | Status: DC

## 2011-09-25 MED ORDER — ALPRAZOLAM 1 MG PO TABS: 1.0000 mg | ORAL_TABLET | Freq: Two times a day (BID) | ORAL | Status: DC

## 2011-09-25 MED ORDER — OXYCODONE HCL 15 MG PO TB12: 45.0000 mg | ORAL_TABLET | Freq: Two times a day (BID) | ORAL | Status: DC

## 2011-09-25 NOTE — Discharge Summary (Signed)
Patient ID: Ryan [name] MRN: [id number] DOB/AGE: 1951-04-14 61 y.o.  Admit date: 09/21/2011 Discharge date: 09/25/2011  Primary Care Physician:  Warrick Parisian, MD, MD  Discharge Diagnoses:   Present on Admission:  .Intestinal metaplasia of gastric mucosa .Gastric ulceration .Constipation due to pain medication .Narcotic dependence .Abdominal pain .HYPERTENSION .GERD .DIABETES MELLITUS, TYPE II     Medication List  As of 09/25/2011  2:47 PM   STOP taking these medications         polyethylene glycol packet      ranitidine 300 MG capsule         TAKE these medications         ALPRAZolam 1 MG tablet   Commonly known as: XANAX   Take 1 tablet (1 mg total) by mouth 2 (two) times daily.      aspirin EC 81 MG tablet   Take 81 mg by mouth daily.      gabapentin 400 MG capsule   Commonly known as: NEURONTIN   Take 1 capsule (400 mg total) by mouth 3 (three) times daily.      glipiZIDE 10 MG 24 hr tablet   Commonly known as: GLUCOTROL XL   Take 1 tablet (10 mg total) by mouth 2 (two) times daily.      insulin glargine 100 UNIT/ML injection   Commonly known as: LANTUS   Inject 10 Units into the skin at bedtime.      losartan 100 MG tablet   Commonly known as: COZAAR   Take 1 tablet (100 mg total) by mouth daily.      metFORMIN 850 MG tablet   Commonly known as: GLUCOPHAGE   Take 850 mg by mouth 3 (three) times daily.      ondansetron 4 MG tablet   Commonly known as: ZOFRAN   Take 1 tablet (4 mg total) by mouth every 6 (six) hours as needed. For nausea      oxyCODONE 15 MG Tb12   Commonly known as: OXYCONTIN   Take 3 tablets (45 mg total) by mouth every 12 (twelve) hours.      oxycodone 30 MG immediate release tablet   Commonly known as: ROXICODONE   Take 1 tablet (30 mg total) by mouth every 4 (four) hours as needed. For pain      pantoprazole 40 MG tablet   Commonly known as: PROTONIX   Take 1 tablet (40 mg total) by mouth daily at 12 noon.     sennosides-docusate sodium 8.6-50 MG tablet   Commonly known as: SENOKOT-S   Take 1 tablet by mouth 2 (two) times daily as needed. For constipation      sucralfate 1 GM/10ML suspension   Commonly known as: CARAFATE   Take 10 mLs (1 g total) by mouth 4 (four) times daily -  with meals and at bedtime.            Consults: [facility]  Procedures:  Upper Endoscopy 09/22/11  Brief H and P: From the admission note:  Patient is a 61 year old African American male with a history of hypertension, diabetes, dyslipidemia, frequent ER visits and admissions for acute or chronic abdominal pain comes in reporting vomiting and severe pain for 1 week.  He claims that he was in the ED a few days ago for similar complaints and was discharged home. He claims that the abdominal pain is mostly located in his mid abdominal area, at its worst it is around 10/10 with no specific radiation. He describes  the pain to be off colicky in nature, with no particular aggravating or relieving factors. He claims to have numerous episodes of nausea and vomiting over the past few days, he claims that he is not even able to keep liquids down. He claims that he had 2 bowel movements today, claims that he has been moving his bowels almost on a daily basis for the past 2 days. Because of the persistent nature of his abdominal pain he was evaluated in the ED, a abdominal x-ray shows possible early/partial bowel obstruction.   Hospital Course:   1.  Large ulcerated gastric mass; erosive esophagitis; prominent antral fold; polypoid lesion on ampulla.  The patient underwent upper endoscopy. Biopsies were obtained from the large ulcerated gastric mass and the prominent antral fold. The large gastric mass pathology revealed simply ulceration without malignancy, but the prominent antral fold showed focal intestinal metaplasia which will require a followup endoscopy in 6 weeks. The polypoid lesion on ampulla was not biopsied.   Ryan [name] is tolerating a full liquid and soft chopped solid diet with twice a day proton pump inhibitors and sucralfate. He will remain on twice a day proton pump inhibitors until he follows up with Dr. Marina Goodell of gastroenterology. The patient has also been counseled to stop smoking as this will worsen his ulcerations and increase the likelihood of contracting gastric cancer.  2.  fecal obstipation/partial bowel obstruction. The patient had severe constipation at the time of admission he was given multiple stool softeners and laxatives and had multiple loose bowel movements. At this point in time he is no longer constipated but we strongly recommend a daily regimen of stool softeners. The patient's chronic narcotic medications are likely to blame for his constipation.  3.  narcotic dependence. The patient frequently requested Dilaudid-HP pain medication. We started him on oral OxyContin twice a day with oxycodone for breakthrough pain.  This was enough to manage his pain. He will be started discharged with a very limited prescription for enough pain medication to last until he sees his primary care physician on Monday, June 3. We would recommend a narcotic pain medication contract be created with his primary care physician or possibly, referral to a pain management clinic may be appropriate.  4.  diabetes mellitus.  Hemoglobin A1c is 11.6. The patient's diabetes was uncontrolled partially because he had been vomiting so much prior to admission. His home medications indicated that he was on Lantus however, the patient mentioned that he was only taking 2 pills for his diabetes.  I believe his previously prescribed diabetic medication regimen that included glipizide, metformin, and Lantus at bedtime is still appropriate for him. We request that his primary care physician monitor his diabetes ongoing.    Physical Exam on Discharge: General: Alert, awake, oriented x3, in no acute distress, pleasant HEENT: No  bruits, no goiter. Heart: Regular rate and rhythm, without murmurs, rubs, gallops. Lungs: Clear to auscultation bilaterally. Abdomen: Obese, Soft, nontender, nondistended, positive bowel sounds. Extremities: No clubbing cyanosis or edema with positive pedal pulses. Neuro: Grossly intact, nonfocal.  Filed Vitals:   09/24/11 1406 09/24/11 2035 09/25/11 0434 09/25/11 1314  BP: 135/83 133/79 125/81 151/89  Pulse: 71 64 57 61  Temp: 98.3 F (36.8 C) 98.1 F (36.7 C) 97.7 F (36.5 C) 98.2 F (36.8 C)  TempSrc: Oral Oral Oral Oral  Resp: 18 17 17 20   Height:      Weight:      SpO2: 100% 93% 95% 100%  Intake/Output Summary (Last 24 hours) at 09/25/11 1447 Last data filed at 09/25/11 1300  Gross per 24 hour  Intake   1210 ml  Output   3000 ml  Net  -1790 ml    Basic Metabolic Panel:  Lab 09/23/11 0454 09/22/11 0646  NA 135 134*  K 4.0 3.8  CL 103 109  CO2 24 21  GLUCOSE 189* 164*  BUN 6 9  CREATININE 0.59 0.60  CALCIUM 8.9 8.8  MG -- --  PHOS -- --   Liver Function Tests:  Lab 09/22/11 0646 09/21/11 1059  AST 11 12  ALT 7 8  ALKPHOS 70 78  BILITOT 0.3 0.3  PROT 6.7 7.7  ALBUMIN 3.1* 3.6    Lab 09/21/11 1059  LIPASE 32  AMYLASE --   No results found for this basename: AMMONIA:2 in the last 168 hours CBC:  Lab 09/23/11 0829 09/22/11 0646 09/21/11 1059  WBC 3.3* 4.4 --  NEUTROABS -- -- 2.3  HGB 13.3 14.0 --  HCT 40.0 43.8 --  MCV 93.0 95.6 --  PLT 194 169 --   Cardiac Enzymes: No results found for this basename: CKTOTAL:3,CKMB:3,CKMBINDEX:3,TROPONINI:3 in the last 168 hours BNP: No components found with this basename: POCBNP:3 D-Dimer: No results found for this basename: DDIMER:2 in the last 168 hours CBG:  Lab 09/25/11 1211 09/25/11 0756 09/24/11 2121 09/24/11 1700 09/24/11 1200 09/24/11 0758  GLUCAP 202* 140* 244* 185* 166* 158*   Coagulation:  Lab 09/22/11 0646  LABPROT 13.1  INR 0.97    Drugs of Abuse     Component Value Date/Time    LABOPIA NEGATIVE 11/13/2010 2022   LABOPIA POSITIVE* 04/27/2010 1102   COCAINSCRNUR NEGATIVE 11/13/2010 2022   COCAINSCRNUR POSITIVE* 04/27/2010 1102   LABBENZ POSITIVE* 11/13/2010 2022   LABBENZ POSITIVE* 04/27/2010 1102   AMPHETMU NEGATIVE 11/13/2010 2022   AMPHETMU NONE DETECTED 04/27/2010 1102   THCU NONE DETECTED 04/27/2010 1102   LABBARB  Value: NONE DETECTED        DRUG SCREEN FOR MEDICAL PURPOSES ONLY.  IF CONFIRMATION IS NEEDED FOR ANY PURPOSE, NOTIFY LAB WITHIN 5 DAYS.        LOWEST DETECTABLE LIMITS FOR URINE DRUG SCREEN Drug Class       Cutoff (ng/mL) Amphetamine      1000 Barbiturate      200 Benzodiazepine   200 Tricyclics       300 Opiates          300 Cocaine          300 THC              50 04/27/2010 1102      Significant Diagnostic Studies:  Ct Abdomen Pelvis W Contrast  09/21/2011  *RADIOLOGY REPORT*  Clinical Data: Persistent vomiting and abdominal pain for a week.  CT ABDOMEN AND PELVIS WITH CONTRAST  Technique:  Multidetector CT imaging of the abdomen and pelvis was performed following the standard protocol during bolus administration of intravenous contrast.  Contrast: OMNIPAQUE IOHEXOL 300 MG/ML  SOLN  Comparison: 08/22/2011  Findings: Visualization of the lung bases is limited due to respiratory motion artifact but there appears to be patchy infiltration bilaterally.  The enteric tube with tip in the stomach.  Surgical absence of the gallbladder.  2.4 cm diameter hypodense nodule in the right adrenal gland is stable since the previous study and likely represents an adenoma.  This has been present on previous studies dating back to 11/19/2009.  The liver, spleen,  pancreas, bile ducts, left adrenal gland, abdominal aorta, and retroperitoneal lymph nodes are unremarkable. There are small sub centimeter low attenuation lesions in the left kidney likely representing cysts.  There is a somewhat amorphous area of low attenuation in the medial aspect of the left kidney measuring  about 2.4 x 2.8 cm.  This has been present on previous studies but appears to be more prominent today.  While this may represent a cyst, while this may represent a cyst, a solid lesion is not excluded.  Consider ultrasound for further evaluation.  The stomach and small bowel are not distended.  Contrast material extends to the colon.  No colonic distension or wall thickening. No free air or free fluid in the abdomen.  Small umbilical hernia. Focal areas of subcutaneous emphysema along the right anterior abdominal wall likely representing injection site.  Pelvis:  Calcification in the prostate gland with mild prostatic enlargement.  The bladder wall is not thickened.  No inflammatory changes in the sigmoid colon.  The appendix is normal.  No significant lymphadenopathy in the pelvis.  No free or loculated pelvic fluid collections.  Postoperative changes in the lumbar spine with posterior rod and screw fixation and intervertebral fusion of L4-L5.  Scarring in the posterior paraspinal soft tissues consistent with postoperative change.  IMPRESSION: No focal acute process demonstrated in the abdomen or pelvis. Stable appearance of right adrenal gland nodule, most likely an adenoma.  Indeterminate lesion in the medial inferior aspect of the left kidney.  Ultrasound examination is recommended to differentiate cystic or solid lesion.  Small umbilical hernia. Enlarged prostate gland with calcification.  Postoperative changes in the lumbar spine.  Cholecystectomy.  Original Report Authenticated By: Marlon Pel, M.D.   Dg Abd Acute W/chest  09/21/2011  *RADIOLOGY REPORT*  Clinical Data: Mid to lower abdominal pain for 1 week.  Nausea, vomiting.  Right red blood in vomitus.  Diarrhea.  History of pancreatitis.  History of small bowel obstruction.  ACUTE ABDOMEN SERIES (ABDOMEN 2 VIEW & CHEST 1 VIEW)  Comparison: 09/18/2011  Findings: The heart is normal in size.  There is prominence of interstitial markings especially  at the bases, raising the question of interstitial infiltrates.  No pleural effusions are identified. No free intraperitoneal air.  There is dilatation of a small bowel loop within the left mid abdomen, consistent with partial or early small bowel obstruction.  Large bowel is not dilated.  Patient has had prior lower lumbar spine surgery.  Surgical clips are identified within the right upper quadrant of the abdomen.  IMPRESSION:  1. New bilateral lower lobe interstitial infiltrates.  Question of aspiration. 2.  Partial or early small bowel obstruction. 3.  No free intraperitoneal air.  Original Report Authenticated By: Patterson Hammersmith, M.D.   Dg Abd Acute W/chest  09/18/2011  *RADIOLOGY REPORT*  Clinical Data: Diffuse abdominal pain with nausea.  ACUTE ABDOMEN SERIES (ABDOMEN 2 VIEW & CHEST 1 VIEW)  Comparison: Chest radiograph 07/17/2011 and abdominal radiograph 08/23/2011.  Findings: Frontal view of the chest shows midline trachea.  Heart size is accentuated by technique.  Mild bibasilar parenchymal densities appear chronic.  Two views of the abdomen show a fair amount of stool in the colon. No small bowel dilatation.  No free air.  IMPRESSION:  Constipation.  Original Report Authenticated By: Reyes Ivan, M.D.      Disposition and Follow-up: Stable for discharge to home with the appropriate followup and diet.  Discharge Orders    Future  Appointments: Provider: Department: Dept Phone: Center:   11/04/2011 10:15 AM Hilarie Fredrickson, MD [facility] [phone] [facility]     Future Orders Please Complete By Expires   Diet Carb Modified      Comments:   Very soft solids (chopped foods, soups, milkshakes).  Until burning pain and vomiting subside.   Increase activity slowly        Follow-up Information    Follow up with Yancey Flemings, MD on 11/04/2011. (10:15 AM)    Contact information:   [address] [facility] [address] [place] [address] [place] [address] [phone]        Follow up with Warrick Parisian, MD on 09/30/2011. (Arrive at 1:40 for a 1:50 appointment)    Contact information:   [address] [place] [address] [phone]           Time spent on Discharge: 40 min.   SignedStephani Police 09/25/2011, 2:47 PM [phone]

## 2011-09-25 NOTE — Progress Notes (Signed)
     [facility] Daily Rounding Note 09/25/2011, 8:41 AM  SUBJECTIVE:       vomitted last PM after his diabetic diet meal.  Had about 3 BM yesterday, one this AM.  OBJECTIVE:        General: Looks chronically unwell,  Laying flat in bed     Vital signs in last 24 hours:    Temp:  [97.7 F (36.5 C)-98.3 F (36.8 C)] 97.7 F (36.5 C) (05/29 0434) Pulse Rate:  [57-71] 57  (05/29 0434) Resp:  [17-18] 17  (05/29 0434) BP: (125-135)/(79-83) 125/81 mmHg (05/29 0434) SpO2:  [93 %-100 %] 95 % (05/29 0434) Last BM Date: 09/23/11  Heart: RRR Chest: clear B Abdomen: soft, NT, Obese.  BS active  Extremities: slight pedal edema Neuro/Psych:  Pleasant, flat/depressed affect.   Intake/Output from previous day: 05/28 0701 - 05/29 0700 In: 1392 [P.O.:902; I.V.:490] Out: 2350 [Urine:2350]  Intake/Output this shift: Total I/O In: -  Out: 900 [Urine:900]  Lab Results:  [facility] 09/23/11 0829  WBC 3.3*  HGB 13.3  HCT 40.0  PLT 194   BMET  Basename 09/23/11 0829  NA 135  K 4.0  CL 103  CO2 24  GLUCOSE 189*  BUN 6  CREATININE 0.59  CALCIUM 8.9   ASSESMENT: * Epigastric pain with n/v, acute on chronic.  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 Also biopsy of antral prominent fold. Polypoid lesion at Ampulla was not biopsied.  On once daily PO Protonix once daily and Carafate liquid TID  Lipase in 06/2010 was 129. Subsequent Lipase measures all consistently normal. CT scan without pancreas abnormalities, so I am not convinced of the diagnoses of chronic pancreatitis  Suspect underlying gastroparesis in diabetic taking chronic narcotics * DM 2  * Morbid obesity  * Prostate calcifications, does this need work up?  * Chronic pain, has been seen at [facility] in past. Chronic narcotics.  * Constipation, improved.     PLAN: *  Trial of low dose Reglan with meals, tid. *  Has 11/04/11 ROV with Dr Marina Goodell *  Continue bid Protonix or other PPI *  Low  calorie diabetic diet.  *  No NSAIDs except low dose ASA *  Re-challenge with carb-mod diet, he wants to give solids another shot. *  No Gastric emptying study unless he can come off narcotics for 24 hours.  *  Needs to mobilize, sit up in chair, rather than lay in bed.    LOS: 4 days   Jennye Moccasin  09/25/2011, 8:41 AM Pager: [phone]   GI ATTENDING  PT SEEN AND EXAMINED. AGREE WITH ABOVE.BX NEGATIVE. PLAN BID PPI, NO NSAIDS, AND OFFICE FOLLOW-UP AND FOLLOW UP EGD 6-8 WEEKS. DISCUSSED WITH PT AND HIS ATTENDING. WILL SIGN OFF  Wilhemina Bonito. Eda Keys., M.D. [facility] Division of Gastroenterology

## 2011-09-25 NOTE — Discharge Instructions (Signed)
Eat primarily full liquids and soft chopped foods.  Advance your diet very slowly to avoid pain and vomiting. Take PPI (acid reducer) twice daily.  Take sucralfate as needed to reduce acid and burning pain.  Stop tobacco use as this will contribute to ulcerations and the possible development of stomach cancer.

## 2011-09-25 NOTE — Care Management Note (Signed)
    Page 1 of 1   09/25/2011     4:37:58 PM   CARE MANAGEMENT NOTE 09/25/2011  Patient:  Ryan [name]   Account Number:  [id number]  Date Initiated:  09/25/2011  Documentation initiated by:  Letha Cape  Subjective/Objective Assessment:   dx abd pain, ulcerating mass  admit- lives alone. pta independent.     Action/Plan:   Anticipated DC Date:  09/25/2011   Anticipated DC Plan:  HOME/SELF CARE      DC Planning Services  CM consult      Choice offered to / List presented to:             Status of service:  Completed, signed off Medicare Important Message given?   (If response is "NO", the following Medicare IM given date fields will be blank) Date Medicare IM given:   Date Additional Medicare IM given:    Discharge Disposition:  HOME/SELF CARE  Per UR Regulation:  Reviewed for med. necessity/level of care/duration of stay  If discussed at Long Length of Stay Meetings, dates discussed:    Comments:  09/25/11 16:36 Letha Cape RN, BSN [phone] patient lives alone, pta independent.  No needs anticiapated. Pt dc to home today.

## 2011-09-25 NOTE — Progress Notes (Signed)
Pt. Tolerated graham crackers and coffee during the night.

## 2011-09-25 NOTE — Progress Notes (Signed)
Pt discharged to home per MD order.  IV removed, dressing and pressure applied, site unremarkable.  Prescriptions, follow-up appointments, and discharge instructions given.  All belongings sent with pt and all questions answered.  Pt transported in wheelchair by tech and traveled home by public transportation.

## 2011-10-11 ENCOUNTER — Emergency Department (HOSPITAL_COMMUNITY)
Admission: EM | Admit: 2011-10-11 | Discharge: 2011-10-11 | Disposition: A | Discharge status: Home / Self Care | Payer: Medicare Other | Attending: Emergency Medicine | Admitting: Emergency Medicine

## 2011-10-11 ENCOUNTER — Emergency Department (HOSPITAL_COMMUNITY): Payer: Medicare Other

## 2011-10-11 ENCOUNTER — Encounter (HOSPITAL_COMMUNITY): Payer: Self-pay | Admitting: *Deleted

## 2011-10-11 DIAGNOSIS — I10 Essential (primary) hypertension: Secondary | ICD-10-CM | POA: Insufficient documentation

## 2011-10-11 DIAGNOSIS — Z794 Long term (current) use of insulin: Secondary | ICD-10-CM | POA: Insufficient documentation

## 2011-10-11 DIAGNOSIS — R109 Unspecified abdominal pain: Secondary | ICD-10-CM | POA: Insufficient documentation

## 2011-10-11 DIAGNOSIS — E119 Type 2 diabetes mellitus without complications: Secondary | ICD-10-CM | POA: Insufficient documentation

## 2011-10-11 DIAGNOSIS — R10819 Abdominal tenderness, unspecified site: Secondary | ICD-10-CM | POA: Insufficient documentation

## 2011-10-11 DIAGNOSIS — I2510 Atherosclerotic heart disease of native coronary artery without angina pectoris: Secondary | ICD-10-CM | POA: Insufficient documentation

## 2011-10-11 DIAGNOSIS — R1110 Vomiting, unspecified: Secondary | ICD-10-CM | POA: Insufficient documentation

## 2011-10-11 LAB — DIFFERENTIAL
Basophils Absolute: 0 10*3/uL (ref 0.0–0.1)
Lymphocytes Relative: 42 % (ref 12–46)
Lymphs Abs: 2 10*3/uL (ref 0.7–4.0)
Monocytes Absolute: 0.4 10*3/uL (ref 0.1–1.0)
Neutro Abs: 2.2 10*3/uL (ref 1.7–7.7)

## 2011-10-11 LAB — COMPREHENSIVE METABOLIC PANEL
ALT: 9 U/L (ref 0–53)
AST: 11 U/L (ref 0–37)
CO2: 29 mEq/L (ref 19–32)
Chloride: 102 mEq/L (ref 96–112)
GFR calc non Af Amer: >90 mL/min (ref >90)
Glucose, Bld: 175 mg/dL — ABNORMAL HIGH (ref 70–99)
Sodium: 139 mEq/L (ref 135–145)
Total Bilirubin: 0.2 mg/dL — ABNORMAL LOW (ref 0.3–1.2)

## 2011-10-11 LAB — CBC
HCT: 42.6 % (ref 39.0–52.0)
Platelets: 263 10*3/uL (ref 150–400)
RBC: 4.45 MIL/uL (ref 4.22–5.81)
RDW: 14 % (ref 11.5–15.5)
WBC: 4.7 10*3/uL (ref 4.0–10.5)

## 2011-10-11 MED ORDER — IOHEXOL 300 MG/ML  SOLN
100.0000 mL | Freq: Once | INTRAMUSCULAR | Status: AC | PRN
  Administered 2011-10-11: 100 mL via INTRAVENOUS

## 2011-10-11 MED ORDER — HYDROMORPHONE HCL PF 1 MG/ML IJ SOLN
2.0000 mg | Freq: Once | INTRAMUSCULAR | Status: AC
  Administered 2011-10-11: 2 mg via INTRAVENOUS
  Filled 2011-10-11: qty 2

## 2011-10-11 MED ORDER — IOHEXOL 300 MG/ML  SOLN
20.0000 mL | INTRAMUSCULAR | Status: AC
  Administered 2011-10-11 (×2): 20 mL via ORAL

## 2011-10-11 MED ORDER — SODIUM CHLORIDE 0.9 % IV SOLN
1000.0000 mL | INTRAVENOUS | Status: DC
  Administered 2011-10-11: 1000 mL via INTRAVENOUS

## 2011-10-11 MED ORDER — ONDANSETRON HCL 4 MG/2ML IJ SOLN
4.0000 mg | Freq: Once | INTRAMUSCULAR | Status: AC
  Administered 2011-10-11: 4 mg via INTRAVENOUS
  Filled 2011-10-11: qty 2

## 2011-10-11 MED ORDER — HYDROMORPHONE HCL PF 1 MG/ML IJ SOLN
1.0000 mg | Freq: Once | INTRAMUSCULAR | Status: AC
  Administered 2011-10-11: 1 mg via INTRAVENOUS
  Filled 2011-10-11: qty 1

## 2011-10-11 MED ORDER — SODIUM CHLORIDE 0.9 % IV SOLN
1000.0000 mL | Freq: Once | INTRAVENOUS | Status: AC
  Administered 2011-10-11: 1000 mL via INTRAVENOUS

## 2011-10-11 NOTE — ED Notes (Signed)
MD at bedside. 

## 2011-10-11 NOTE — ED Notes (Signed)
Patient reports he was seen here recently 2 weeks ago,  Dx with something wrong with his esophagus.  He has been on a special diet.  He states he has not been able to keep his meds or food down.  Patient states he was a little better but for 3 to 4 days he has not been well.  Patient complains of abd pain

## 2011-10-11 NOTE — ED Notes (Signed)
Pt returned from CT scanner

## 2011-10-11 NOTE — ED Notes (Signed)
Pt c/o wait of seeing the MD. Apology given.

## 2011-10-11 NOTE — ED Provider Notes (Signed)
History     CSN: [id number]  Arrival date & time 10/11/11  1214   First MD Initiated Contact with Patient 10/11/11 1501      Chief Complaint  Patient presents with  . Emesis   HPI Comments: Ryan Creek, RN 10/11/2011 12:21     Patient reports he was seen here recently 2 weeks ago,  Dx with something wrong with his esophagus.  He has been on a special diet.  He states he has not been able to keep his meds or food down.  Patient states he was a little better but for 3 to 4 days he has not been well.  Patient complains of abd pain    Patient is a 61 y.o. male presenting with vomiting. The history is provided by the patient.  Emesis  This is a recurrent problem. The current episode started more than 2 days ago. The problem occurs continuously. The problem has been gradually worsening. The emesis has an appearance of stomach contents. The maximum temperature recorded prior to his arrival was 100 to 100.9 F. Associated symptoms include abdominal pain. Pertinent negatives include no chills, no diarrhea and no fever.  The whole abdomen is hurting and the pain is excruciating.  Eating makes it worse.  Pt has been seen several times for similar issues recently and is supposed to be on a pureed diet.    Past Medical History  Diagnosis Date  . Hypertension   . Diabetes mellitus   . DVT (deep venous thrombosis)   . Chronic back pain   . 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 Hyperlipidemia   . CAD (coronary atherosclerotic disease)     s/p stents x2  . Small bowel obstruction     Past Surgical History  Procedure Date  . Heart stent   . Tonsillectomy and adenoidectomy   . Cholecystectomy   . Back surgery   . Esophagogastroduodenoscopy 09/22/2011    Procedure: ESOPHAGOGASTRODUODENOSCOPY (EGD);  Surgeon: Charna Elizabeth, MD;  Location: [facility] ENDOSCOPY;  Service: Endoscopy;  Laterality: N/A;    Family History  Problem Relation Age of Onset  . Diabetes Father      History  Substance Use Topics  . Smoking status: Current Some Day Smoker -- 0.2 packs/day for 30 years    Types: Cigarettes  . Smokeless tobacco: Never Used  . Alcohol Use: No      Review of Systems  Constitutional: Negative for fever and chills.  Gastrointestinal: Positive for vomiting and abdominal pain. Negative for diarrhea.    Allergies  Lisinopril and Morphine and related  Home Medications   Current Outpatient Rx  Name Route Sig Dispense Refill  . ALPRAZOLAM 1 MG PO TABS Oral Take 1 tablet (1 mg total) by mouth 2 (two) times daily. 10 tablet 0  . ASPIRIN EC 81 MG PO TBEC Oral Take 81 mg by mouth daily.    Marland Kitchen GABAPENTIN 400 MG PO CAPS Oral Take 1 capsule (400 mg total) by mouth 3 (three) times daily. 20 capsule 0  . GLIPIZIDE ER 10 MG PO TB24 Oral Take 1 tablet (10 mg total) by mouth 2 (two) times daily. 11 tablet 0  . INSULIN GLARGINE 100 UNIT/ML [place] SOLN Subcutaneous Inject 10 Units into the skin at bedtime. 10 mL 1  . LOSARTAN POTASSIUM 100 MG PO TABS Oral Take 1 tablet (100 mg total) by mouth daily. 30 tablet 0  . METFORMIN HCL 850 MG PO TABS Oral  Take 850 mg by mouth 3 (three) times daily.    Marland Kitchen ONDANSETRON HCL 4 MG PO TABS Oral Take 1 tablet (4 mg total) by mouth every 6 (six) hours as needed. For nausea 20 tablet 0  . OXYCODONE HCL 30 MG PO TABS Oral Take 1 tablet (30 mg total) by mouth every 4 (four) hours as needed. For pain 30 tablet 0  . PANTOPRAZOLE SODIUM 40 MG PO TBEC Oral Take 1 tablet (40 mg total) by mouth daily at 12 noon. 60 tablet 0  . SENNA-DOCUSATE SODIUM 8.6-50 MG PO TABS Oral Take 1 tablet by mouth 2 (two) times daily as needed. For constipation    . SUCRALFATE 1 GM/10ML PO SUSP Oral Take 10 mLs (1 g total) by mouth 4 (four) times daily -  with meals and at bedtime. 420 mL 2    BP 114/73  Pulse 76  Temp 97.7 F (36.5 C) (Oral)  Resp 22  Ht 6\' 5"  (1.956 m)  Wt 290 lb (131.543 kg)  BMI 34.39 kg/m2  SpO2 94%  Physical Exam  Nursing note and  vitals reviewed. Constitutional: No distress.  HENT:  Head: Normocephalic and atraumatic.  Right Ear: External ear normal.  Left Ear: External ear normal.  Eyes: Conjunctivae are normal. Right eye exhibits no discharge. Left eye exhibits no discharge. No scleral icterus.  Neck: Neck supple. No tracheal deviation present.  Cardiovascular: Normal rate, regular rhythm and intact distal pulses.   Pulmonary/Chest: Effort normal and breath sounds normal. No stridor. No respiratory distress. He has no wheezes. He has no rales.  Abdominal: Soft. Bowel sounds are normal. He exhibits no distension. There is tenderness (diffuse). There is no rebound and no guarding.  Musculoskeletal: He exhibits no edema and no tenderness.  Neurological: He is alert. He has normal strength. No sensory deficit. Cranial nerve deficit:  no gross defecits noted. He exhibits normal muscle tone. He displays no seizure activity. Coordination normal.  Skin: Skin is warm and dry. No rash noted.  Psychiatric: He has a normal mood and affect.    ED Course  Procedures (including critical care time)  Labs Reviewed  COMPREHENSIVE METABOLIC PANEL - Abnormal; Notable for the following:    Glucose, Bld 175 (*)     Albumin 3.3 (*)     Total Bilirubin 0.2 (*)     All other components within normal limits  CBC  DIFFERENTIAL  LIPASE, BLOOD  URINALYSIS, ROUTINE W REFLEX MICROSCOPIC   Ct Abdomen Pelvis W Contrast  10/11/2011  *RADIOLOGY REPORT*  Clinical Data: Persistent vomiting.  Abdominal pain.  CT ABDOMEN AND PELVIS WITH CONTRAST  Technique:  Multidetector CT imaging of the abdomen and pelvis was performed following the standard protocol during bolus administration of intravenous contrast.  Contrast: OMNIPAQUE IOHEXOL 300 MG/ML  SOLN  Comparison: CT scan dated 09/21/2011  Findings: The liver, spleen, pancreas, left adrenal gland, and right kidney are normal.  There is a stable benign lesion in the right adrenal gland.   Nonspecific 2.3 cm lesion in the inferior medial aspect of the left kidney is unchanged since 08/22/2011. It probably represents an atypical cyst but was not identified on ultrasound exam of 08/22/2011.  It is not visible on prior CT scan of 04/01/2009.  There is no significant adenopathy.  No dilated loops of bowel. Terminal ileum and appendix are normal.  No acute osseous abnormality.  IMPRESSION: No acute abnormalities of the abdomen or pelvis.  Persistent indeterminate low density lesion in the  inferior medial aspect of the left kidney.  Follow-up with abdominal MRI with/without contrast would be useful.  Original Report Authenticated By: Gwynn Burly, M.D.     1. Abdominal pain       MDM  The patient presents to the emergency room with complaints of recurrent abdominal pain. Prior records have been reviewed. Patient has history of recurrent abdominal pain problems and associated narcotic addiction. Patient previously had an endoscopy that showed a gastric ulcer.   The patient has been placed on antacids. He is also taking oxycodone at home. This time there does not appear to be evidence of bowel obstruction. His laboratory tests are unremarkable. The patient has been given IV pain medications. He has no evidence to suggest gastric perforation or hemorrhagic ulcer. The patient followup with his gastrointestinal doctor        Celene Kras, MD 10/11/11 2009

## 2011-10-11 NOTE — ED Notes (Signed)
Pt given discharge and follow up instructions afte rspeaking with dr knapp. Pt denies further needs at this time. Ambulates to lobby in NAD

## 2011-10-11 NOTE — ED Notes (Signed)
Patient is resting comfortably. Pt is sitting up on side of bed c/o abdominal pain 10/10.

## 2011-10-11 NOTE — ED Notes (Signed)
Pt requesting more pain medication. Reports no relief following first two doses. Pt rates pain 9./10 and describes as sharp in nature in mid abdomen.

## 2011-10-11 NOTE — Discharge Instructions (Signed)

## 2011-10-16 ENCOUNTER — Encounter (HOSPITAL_COMMUNITY): Payer: Self-pay | Admitting: Cardiology

## 2011-10-16 ENCOUNTER — Emergency Department (HOSPITAL_COMMUNITY): Payer: Medicare Other

## 2011-10-16 ENCOUNTER — Emergency Department (HOSPITAL_COMMUNITY)
Admission: EM | Admit: 2011-10-16 | Discharge: 2011-10-16 | Disposition: A | Discharge status: Home / Self Care | Payer: Medicare Other | Attending: Emergency Medicine | Admitting: Emergency Medicine

## 2011-10-16 DIAGNOSIS — R1032 Left lower quadrant pain: Secondary | ICD-10-CM | POA: Insufficient documentation

## 2011-10-16 DIAGNOSIS — F17200 Nicotine dependence, unspecified, uncomplicated: Secondary | ICD-10-CM | POA: Insufficient documentation

## 2011-10-16 DIAGNOSIS — G8929 Other chronic pain: Secondary | ICD-10-CM | POA: Insufficient documentation

## 2011-10-16 DIAGNOSIS — I2510 Atherosclerotic heart disease of native coronary artery without angina pectoris: Secondary | ICD-10-CM | POA: Insufficient documentation

## 2011-10-16 DIAGNOSIS — R109 Unspecified abdominal pain: Secondary | ICD-10-CM

## 2011-10-16 DIAGNOSIS — Z79899 Other long term (current) drug therapy: Secondary | ICD-10-CM | POA: Insufficient documentation

## 2011-10-16 DIAGNOSIS — R112 Nausea with vomiting, unspecified: Secondary | ICD-10-CM | POA: Insufficient documentation

## 2011-10-16 DIAGNOSIS — Z86718 Personal history of other venous thrombosis and embolism: Secondary | ICD-10-CM | POA: Insufficient documentation

## 2011-10-16 DIAGNOSIS — E785 Hyperlipidemia, unspecified: Secondary | ICD-10-CM | POA: Insufficient documentation

## 2011-10-16 DIAGNOSIS — E119 Type 2 diabetes mellitus without complications: Secondary | ICD-10-CM

## 2011-10-16 DIAGNOSIS — I10 Essential (primary) hypertension: Secondary | ICD-10-CM | POA: Insufficient documentation

## 2011-10-16 LAB — GLUCOSE, CAPILLARY

## 2011-10-16 LAB — COMPREHENSIVE METABOLIC PANEL
ALT: 8 U/L (ref 0–53)
AST: 16 U/L (ref 0–37)
Albumin: 4 g/dL (ref 3.5–5.2)
Calcium: 9.6 mg/dL (ref 8.4–10.5)
GFR calc Af Amer: >90 mL/min (ref >90)
Glucose, Bld: 301 mg/dL — ABNORMAL HIGH (ref 70–99)
Sodium: 135 mEq/L (ref 135–145)
Total Protein: 8.4 g/dL — ABNORMAL HIGH (ref 6.0–8.3)

## 2011-10-16 LAB — CBC
MCH: 32 pg (ref 26.0–34.0)
MCHC: 34.7 g/dL (ref 30.0–36.0)
MCV: 92.1 fL (ref 78.0–100.0)
Platelets: 255 10*3/uL (ref 150–400)
RDW: 14.3 % (ref 11.5–15.5)

## 2011-10-16 LAB — DIFFERENTIAL
Basophils Absolute: 0 10*3/uL (ref 0.0–0.1)
Basophils Relative: 0 % (ref 0–1)
Eosinophils Absolute: 0 10*3/uL (ref 0.0–0.7)
Eosinophils Relative: 0 % (ref 0–5)

## 2011-10-16 MED ORDER — GLIPIZIDE ER 10 MG PO TB24
10.0000 mg | ORAL_TABLET | Freq: Two times a day (BID) | ORAL | Status: DC
  Filled 2011-10-16 (×5): qty 1

## 2011-10-16 MED ORDER — INSULIN ASPART 100 UNIT/ML ~~LOC~~ SOLN
12.0000 [IU] | Freq: Once | SUBCUTANEOUS | Status: AC
  Administered 2011-10-16: 12 [IU] via INTRAVENOUS
  Filled 2011-10-16: qty 1

## 2011-10-16 MED ORDER — PROMETHAZINE HCL 25 MG RE SUPP: 25.0000 mg | Freq: Four times a day (QID) | RECTAL | Status: AC | PRN

## 2011-10-16 MED ORDER — PANTOPRAZOLE SODIUM 40 MG PO TBEC
40.0000 mg | DELAYED_RELEASE_TABLET | Freq: Every day | ORAL | Status: DC
  Administered 2011-10-16: 40 mg via ORAL
  Filled 2011-10-16: qty 1

## 2011-10-16 MED ORDER — ONDANSETRON HCL 4 MG/2ML IJ SOLN
4.0000 mg | Freq: Once | INTRAMUSCULAR | Status: AC
  Administered 2011-10-16: 4 mg via INTRAVENOUS
  Filled 2011-10-16: qty 2

## 2011-10-16 MED ORDER — FENTANYL CITRATE 0.05 MG/ML IJ SOLN
100.0000 ug | Freq: Once | INTRAMUSCULAR | Status: AC
  Administered 2011-10-16: 100 ug via INTRAVENOUS
  Filled 2011-10-16: qty 2

## 2011-10-16 MED ORDER — OXYCODONE HCL 5 MG PO TABS
30.0000 mg | ORAL_TABLET | Freq: Once | ORAL | Status: AC
  Administered 2011-10-16: 30 mg via ORAL
  Filled 2011-10-16: qty 6

## 2011-10-16 MED ORDER — SODIUM CHLORIDE 0.9 % IV SOLN
1000.0000 mL | Freq: Once | INTRAVENOUS | Status: AC
  Administered 2011-10-16: 1000 mL via INTRAVENOUS

## 2011-10-16 MED ORDER — LOSARTAN POTASSIUM 50 MG PO TABS: 100.0000 mg | ORAL_TABLET | Freq: Every day | ORAL | Status: DC

## 2011-10-16 MED ORDER — LOSARTAN POTASSIUM 50 MG PO TABS
100.0000 mg | ORAL_TABLET | Freq: Every day | ORAL | Status: DC
  Filled 2011-10-16: qty 2

## 2011-10-16 MED ORDER — METFORMIN HCL 850 MG PO TABS
850.0000 mg | ORAL_TABLET | Freq: Three times a day (TID) | ORAL | Status: DC
  Filled 2011-10-16 (×3): qty 1

## 2011-10-16 MED ORDER — GI COCKTAIL ~~LOC~~
30.0000 mL | Freq: Once | ORAL | Status: AC
  Administered 2011-10-16: 30 mL via ORAL
  Filled 2011-10-16: qty 30

## 2011-10-16 MED ORDER — METOCLOPRAMIDE HCL 5 MG/ML IJ SOLN
10.0000 mg | Freq: Once | INTRAMUSCULAR | Status: AC
  Administered 2011-10-16: 10 mg via INTRAVENOUS
  Filled 2011-10-16: qty 2

## 2011-10-16 MED ORDER — FENTANYL CITRATE 0.05 MG/ML IJ SOLN
50.0000 ug | Freq: Once | INTRAMUSCULAR | Status: AC
  Administered 2011-10-16: 50 ug via INTRAVENOUS
  Filled 2011-10-16: qty 2

## 2011-10-16 MED ORDER — KETOROLAC TROMETHAMINE 30 MG/ML IJ SOLN
30.0000 mg | Freq: Once | INTRAMUSCULAR | Status: AC
  Administered 2011-10-16: 30 mg via INTRAVENOUS
  Filled 2011-10-16: qty 1

## 2011-10-16 NOTE — ED Provider Notes (Signed)
History     CSN: [id number]  Arrival date & time 10/16/11  0725   First MD Initiated Contact with Patient 10/16/11 0732      Chief Complaint  Patient presents with  . Abdominal Pain    (Consider location/radiation/quality/duration/timing/severity/associated sxs/prior treatment) Patient is a 61 y.o. male presenting with abdominal pain. The history is provided by the patient and medical records.  Abdominal Pain The primary symptoms of the illness include abdominal pain.    Patient presents to ER complaining of abdominal pain and vomiting. Patient has history of recurrent abdominal pain problems and associated narcotic addiction as well as hx of pancreatitis and SBO. Prior records have been reviewed. Patient previously had an endoscopy that showed a gastric ulcer. The patient has been placed on antacids. He is also taking oxycodone at home however states that starting 2 days ago he has acute onset of vomiting with numerous episodes of vomiting stating "I can not hold anything down." He states he has a followup with his gastrointestinal doctor at [facility] within the next couple of weeks. Abdominal pain has been recurrent for many years  with acute onset of vomiting 2 days ago. Denies aggravating or alleviating factors. Patient states abdominal pain is in LLQ and similar to abdominal pain of the past. He denies fevers, chills, CP, SOB, diarrhea, dysuria, hematuria or blood in stool. Patient states he had a BM this morning without difficulty and of normal consistency.   Past Medical History  Diagnosis Date  . Hypertension   . Diabetes mellitus   . DVT (deep venous thrombosis)   . Chronic back pain   . 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 Hyperlipidemia   . CAD (coronary atherosclerotic disease)     s/p stents x2  . Small bowel obstruction     Past Surgical History  Procedure Date  . Heart stent   . Tonsillectomy and adenoidectomy   . Cholecystectomy   . Back  surgery   . Esophagogastroduodenoscopy 09/22/2011    Procedure: ESOPHAGOGASTRODUODENOSCOPY (EGD);  Surgeon: Charna Elizabeth, MD;  Location: [facility] ENDOSCOPY;  Service: Endoscopy;  Laterality: N/A;    Family History  Problem Relation Age of Onset  . Diabetes Father     History  Substance Use Topics  . Smoking status: Current Some Day Smoker -- 0.2 packs/day for 30 years    Types: Cigarettes  . Smokeless tobacco: Never Used  . Alcohol Use: No      Review of Systems  Gastrointestinal: Positive for abdominal pain.  All other systems reviewed and are negative.    Allergies  Lisinopril and Morphine and related  Home Medications   Current Outpatient Rx  Name Route Sig Dispense Refill  . ALPRAZOLAM 1 MG PO TABS Oral Take 1 tablet (1 mg total) by mouth 2 (two) times daily. 10 tablet 0  . ASPIRIN EC 81 MG PO TBEC Oral Take 81 mg by mouth daily.    Marland Kitchen GABAPENTIN 400 MG PO CAPS Oral Take 1 capsule (400 mg total) by mouth 3 (three) times daily. 20 capsule 0  . GLIPIZIDE ER 10 MG PO TB24 Oral Take 1 tablet (10 mg total) by mouth 2 (two) times daily. 11 tablet 0  . INSULIN GLARGINE 100 UNIT/ML [place] SOLN Subcutaneous Inject 10 Units into the skin at bedtime. 10 mL 1  . LOSARTAN POTASSIUM 100 MG PO TABS Oral Take 1 tablet (100 mg total) by mouth daily. 30 tablet 0  . METFORMIN  HCL 850 MG PO TABS Oral Take 850 mg by mouth 3 (three) times daily.    Marland Kitchen ONDANSETRON HCL 4 MG PO TABS Oral Take 1 tablet (4 mg total) by mouth every 6 (six) hours as needed. For nausea 20 tablet 0  . OXYCODONE HCL 30 MG PO TABS Oral Take 1 tablet (30 mg total) by mouth every 4 (four) hours as needed. For pain 30 tablet 0  . PANTOPRAZOLE SODIUM 40 MG PO TBEC Oral Take 1 tablet (40 mg total) by mouth daily at 12 noon. 60 tablet 0  . SENNA-DOCUSATE SODIUM 8.6-50 MG PO TABS Oral Take 1 tablet by mouth 2 (two) times daily as needed. For constipation    . SUCRALFATE 1 GM/10ML PO SUSP Oral Take 10 mLs (1 g total) by mouth 4 (four)  times daily -  with meals and at bedtime. 420 mL 2    BP 138/101  Pulse 94  Temp 99 F (37.2 C) (Oral)  Resp 16  SpO2 98%  Physical Exam  Nursing note and vitals reviewed. Constitutional: He is oriented to person, place, and time. He appears well-developed. No distress.       obese  HENT:  Head: Normocephalic and atraumatic.  Eyes: Conjunctivae are normal.  Neck: Normal range of motion. Neck supple.  Cardiovascular: Normal rate, regular rhythm, normal heart sounds and intact distal pulses.  Exam reveals no gallop and no friction rub.   No murmur heard. Pulmonary/Chest: Effort normal and breath sounds normal. No respiratory distress. He has no wheezes. He has no rales. He exhibits no tenderness.  Abdominal: Soft. Bowel sounds are normal. He exhibits no distension and no mass. There is tenderness. There is no rebound and no guarding.       Diffuse TTP of abdomen with greatest TTP of LLQ but no rigidity or peritoneal signs. Abdomen is soft. No guarding.   Musculoskeletal: Normal range of motion. He exhibits no edema and no tenderness.  Neurological: He is alert and oriented to person, place, and time.  Skin: Skin is warm and dry. No rash noted. He is not diaphoretic. No erythema.  Psychiatric: He has a normal mood and affect.    ED Course  Procedures (including critical care time)  IV fentanyl, IV fluids, IV reglan and PO GI cocktail.   Patient is drinking fluids in ER without difficulty. I have ordered his daily pain medication and diabetes medication.    Labs Reviewed  COMPREHENSIVE METABOLIC PANEL - Abnormal; Notable for the following:    Potassium 3.1 (*)     Chloride 95 (*)     Glucose, Bld 301 (*)     Total Protein 8.4 (*)     GFR calc non Af Amer 88 (*)     All other components within normal limits  CBC  DIFFERENTIAL  LIPASE, BLOOD   Dg Abd Acute W/chest  10/16/2011  *RADIOLOGY REPORT*  Clinical Data: Left lower quadrant abdominal pain.  Diarrhea.  ACUTE ABDOMEN  SERIES (ABDOMEN 2 VIEW & CHEST 1 VIEW)  Comparison: Acute abdominal series 09/21/2011.  Findings: The heart size is normal.  Chronic bronchitic changes are evident bilaterally.  No focal airspace disease is evident.  Supine and upright views of the abdomen demonstrate to air-fluid levels within nondilated bowel.  Postsurgical changes are noted in the lumbar spine.  The patient is status post cholecystectomy.  IMPRESSION:  1.  Fluid levels within nondilated bowel, suggesting an ileus. 2.  Stable chronic bronchitic changes in the lungs  bilaterally.  Original Report Authenticated By: Jamesetta Orleans. MATTERN, M.D.     1. Chronic abdominal pain   2. Nausea and vomiting   3. Diabetes mellitus       MDM  The patient presents to the emergency room with complaints of recurrent abdominal pain. Prior records have been reviewed. Patient has history of recurrent abdominal pain problems and associated narcotic addiction. Patient previously had an endoscopy that showed a gastric ulcer. The patient has been placed on antacids. He is also taking oxycodone at home. Acute abdominal series shows question of ileus though patient is having normal BM and tolerating PO in ER without vomiting and taking PO medications without difficulty. His laboratory tests are unremarkable. The patient has been given IV pain medications. He has no evidence to suggest gastric perforation or hemorrhagic ulcer. The patient followup with his gastrointestinal doctor            Drucie Opitz, [place] 10/16/11 1137

## 2011-10-16 NOTE — Discharge Instructions (Signed)
It is VERY important to follow up closely with your primary care provider and your GI doctor for further evaluation and management of your chronic abdominal pain with nausea and vomiting. Use phenergan as directed for nausea and vomiting. Return to ER for emergent changing or worsening of symptoms.   Abdominal Pain Abdominal pain can be caused by many things. Your caregiver decides the seriousness of your pain by an examination and possibly blood tests and X-rays. Many cases can be observed and treated at home. Most abdominal pain is not caused by a disease and will probably improve without treatment. However, in many cases, more time must pass before a clear cause of the pain can be found. Before that point, it may not be known if you need more testing, or if hospitalization or surgery is needed. HOME CARE INSTRUCTIONS   Do not take laxatives unless directed by your caregiver.   Take pain medicine only as directed by your caregiver.   Only take over-the-counter or prescription medicines for pain, discomfort, or fever as directed by your caregiver.   Try a clear liquid diet (broth, tea, or water) for as long as directed by your caregiver. Slowly move to a bland diet as tolerated.  SEEK IMMEDIATE MEDICAL CARE IF:   The pain does not go away.   You have a fever.   You keep throwing up (vomiting).   The pain is felt only in portions of the abdomen. Pain in the right side could possibly be appendicitis. In an adult, pain in the left lower portion of the abdomen could be colitis or diverticulitis.   You pass bloody or black tarry stools.  MAKE SURE YOU:   Understand these instructions.   Will watch your condition.   Will get help right away if you are not doing well or get worse.  Document Released: 01/23/2005 Document Revised: 04/04/2011 Document Reviewed: 12/02/2007 [facility] Patient Information 2012 [place], [place].

## 2011-10-16 NOTE — ED Notes (Signed)
Pt reports that he has been having intermittant abd pain for the past 4 days. States that he has been unable to keep anything down. No distress noted at this time

## 2011-10-16 NOTE — ED Notes (Signed)
Pt reports that he did not want to wait for medications to come from pharmacy.

## 2011-10-17 ENCOUNTER — Emergency Department (HOSPITAL_COMMUNITY)
Admission: EM | Admit: 2011-10-17 | Discharge: 2011-10-17 | Disposition: A | Discharge status: Home / Self Care | Payer: Medicare Other | Attending: Emergency Medicine | Admitting: Emergency Medicine

## 2011-10-17 ENCOUNTER — Emergency Department (HOSPITAL_COMMUNITY): Payer: Medicare Other

## 2011-10-17 ENCOUNTER — Encounter (HOSPITAL_COMMUNITY): Payer: Self-pay | Admitting: Emergency Medicine

## 2011-10-17 DIAGNOSIS — E785 Hyperlipidemia, unspecified: Secondary | ICD-10-CM | POA: Insufficient documentation

## 2011-10-17 DIAGNOSIS — R112 Nausea with vomiting, unspecified: Secondary | ICD-10-CM

## 2011-10-17 DIAGNOSIS — I2510 Atherosclerotic heart disease of native coronary artery without angina pectoris: Secondary | ICD-10-CM | POA: Insufficient documentation

## 2011-10-17 DIAGNOSIS — Z833 Family history of diabetes mellitus: Secondary | ICD-10-CM | POA: Insufficient documentation

## 2011-10-17 DIAGNOSIS — Z9861 Coronary angioplasty status: Secondary | ICD-10-CM | POA: Insufficient documentation

## 2011-10-17 DIAGNOSIS — R109 Unspecified abdominal pain: Secondary | ICD-10-CM

## 2011-10-17 DIAGNOSIS — Z9089 Acquired absence of other organs: Secondary | ICD-10-CM | POA: Insufficient documentation

## 2011-10-17 DIAGNOSIS — E119 Type 2 diabetes mellitus without complications: Secondary | ICD-10-CM | POA: Insufficient documentation

## 2011-10-17 DIAGNOSIS — I10 Essential (primary) hypertension: Secondary | ICD-10-CM | POA: Insufficient documentation

## 2011-10-17 DIAGNOSIS — R1032 Left lower quadrant pain: Secondary | ICD-10-CM | POA: Insufficient documentation

## 2011-10-17 DIAGNOSIS — Z86718 Personal history of other venous thrombosis and embolism: Secondary | ICD-10-CM | POA: Insufficient documentation

## 2011-10-17 DIAGNOSIS — R197 Diarrhea, unspecified: Secondary | ICD-10-CM | POA: Insufficient documentation

## 2011-10-17 LAB — COMPREHENSIVE METABOLIC PANEL
AST: 14 U/L (ref 0–37)
Albumin: 3.5 g/dL (ref 3.5–5.2)
Chloride: 96 mEq/L (ref 96–112)
Creatinine, Ser: 0.89 mg/dL (ref 0.50–1.35)
Total Bilirubin: 0.6 mg/dL (ref 0.3–1.2)
Total Protein: 7.1 g/dL (ref 6.0–8.3)

## 2011-10-17 LAB — DIFFERENTIAL
Basophils Absolute: 0 10*3/uL (ref 0.0–0.1)
Basophils Relative: 0 % (ref 0–1)
Monocytes Absolute: 0.6 10*3/uL (ref 0.1–1.0)
Neutro Abs: 2.8 10*3/uL (ref 1.7–7.7)
Neutrophils Relative %: 45 % (ref 43–77)

## 2011-10-17 LAB — URINALYSIS, ROUTINE W REFLEX MICROSCOPIC
Glucose, UA: 500 mg/dL — AB
Ketones, ur: NEGATIVE mg/dL
Leukocytes, UA: NEGATIVE
Specific Gravity, Urine: 1.015 (ref 1.005–1.030)
pH: 6.5 (ref 5.0–8.0)

## 2011-10-17 LAB — RAPID URINE DRUG SCREEN, HOSP PERFORMED
Amphetamines: NOT DETECTED
Benzodiazepines: NOT DETECTED
Cocaine: NOT DETECTED
Opiates: NOT DETECTED

## 2011-10-17 LAB — CBC
HCT: 43.7 % (ref 39.0–52.0)
MCHC: 34.1 g/dL (ref 30.0–36.0)
RDW: 13.8 % (ref 11.5–15.5)

## 2011-10-17 MED ORDER — PROCHLORPERAZINE 25 MG RE SUPP: 25.0000 mg | Freq: Two times a day (BID) | RECTAL | Status: DC | PRN

## 2011-10-17 MED ORDER — HYDROMORPHONE HCL PF 1 MG/ML IJ SOLN
0.5000 mg | Freq: Once | INTRAMUSCULAR | Status: AC
  Administered 2011-10-17: 0.5 mg via INTRAVENOUS
  Filled 2011-10-17: qty 1

## 2011-10-17 MED ORDER — SODIUM CHLORIDE 0.9 % IV BOLUS (SEPSIS)
1000.0000 mL | Freq: Once | INTRAVENOUS | Status: AC
  Administered 2011-10-17: 1000 mL via INTRAVENOUS

## 2011-10-17 MED ORDER — SODIUM CHLORIDE 0.9 % IV SOLN
Freq: Once | INTRAVENOUS | Status: AC
  Administered 2011-10-17: 06:00:00 via INTRAVENOUS

## 2011-10-17 MED ORDER — ONDANSETRON HCL 4 MG/2ML IJ SOLN
4.0000 mg | Freq: Once | INTRAMUSCULAR | Status: AC
  Administered 2011-10-17: 4 mg via INTRAVENOUS
  Filled 2011-10-17: qty 2

## 2011-10-17 MED ORDER — HYDROMORPHONE HCL PF 1 MG/ML IJ SOLN
1.0000 mg | Freq: Once | INTRAMUSCULAR | Status: AC
  Administered 2011-10-17: 1 mg via INTRAVENOUS
  Filled 2011-10-17: qty 1

## 2011-10-17 NOTE — ED Provider Notes (Signed)
History     CSN: [id number]  Arrival date & time 10/17/11  0340   First MD Initiated Contact with Patient 10/17/11 0400      Chief Complaint  Patient presents with  . Abdominal Pain    (Consider location/radiation/quality/duration/timing/severity/associated sxs/prior treatment) Patient is a 61 y.o. male presenting with abdominal pain. The history is provided by the patient.  Abdominal Pain The primary symptoms of the illness include abdominal pain.  He had been admitted to the hospital recently with abdominal pain and sent home since for pain killers. Over the last 4 days, he has been having nausea and vomiting. Vomitus is occasionally blood-streaked. He has not been able to take his medications because of the vomiting. Vocal and severe. He rates it at 10 over 10. He denies diarrhea but has been having bowel movements. He denies fever, chills, sweats.  Past Medical History  Diagnosis Date  . Hypertension   . Diabetes mellitus   . DVT (deep venous thrombosis)   . Chronic back pain   . 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 Hyperlipidemia   . CAD (coronary atherosclerotic disease)     s/p stents x2  . Small bowel obstruction     Past Surgical History  Procedure Date  . Heart stent   . Tonsillectomy and adenoidectomy   . Cholecystectomy   . Back surgery   . Esophagogastroduodenoscopy 09/22/2011    Procedure: ESOPHAGOGASTRODUODENOSCOPY (EGD);  Surgeon: Charna Elizabeth, MD;  Location: [facility] ENDOSCOPY;  Service: Endoscopy;  Laterality: N/A;    Family History  Problem Relation Age of Onset  . Diabetes Father     History  Substance Use Topics  . Smoking status: Current Some Day Smoker -- 0.2 packs/day for 30 years    Types: Cigarettes  . Smokeless tobacco: Never Used  . Alcohol Use: No      Review of Systems  Gastrointestinal: Positive for abdominal pain.  All other systems reviewed and are negative.    Allergies  Lisinopril and Morphine and  related  Home Medications   Current Outpatient Rx  Name Route Sig Dispense Refill  . ALPRAZOLAM 1 MG PO TABS Oral Take 1 tablet (1 mg total) by mouth 2 (two) times daily. 10 tablet 0  . ASPIRIN EC 81 MG PO TBEC Oral Take 81 mg by mouth daily.    Marland Kitchen GABAPENTIN 400 MG PO CAPS Oral Take 1 capsule (400 mg total) by mouth 3 (three) times daily. 20 capsule 0  . GLIPIZIDE ER 10 MG PO TB24 Oral Take 1 tablet (10 mg total) by mouth 2 (two) times daily. 11 tablet 0  . INSULIN GLARGINE 100 UNIT/ML [place] SOLN Subcutaneous Inject 10 Units into the skin at bedtime. 10 mL 1  . LOSARTAN POTASSIUM 100 MG PO TABS Oral Take 1 tablet (100 mg total) by mouth daily. 30 tablet 0  . METFORMIN HCL 850 MG PO TABS Oral Take 850 mg by mouth 3 (three) times daily.    Marland Kitchen ONDANSETRON HCL 4 MG PO TABS Oral Take 1 tablet (4 mg total) by mouth every 6 (six) hours as needed. For nausea 20 tablet 0  . OXYCODONE HCL 30 MG PO TABS Oral Take 1 tablet (30 mg total) by mouth every 4 (four) hours as needed. For pain 30 tablet 0  . PANTOPRAZOLE SODIUM 40 MG PO TBEC Oral Take 1 tablet (40 mg total) by mouth daily at 12 noon. 60 tablet 0  . PROMETHAZINE HCL  25 MG RE SUPP Rectal Place 1 suppository (25 mg total) rectally every 6 (six) hours as needed for nausea. 12 each 0  . SENNA-DOCUSATE SODIUM 8.6-50 MG PO TABS Oral Take 1 tablet by mouth 2 (two) times daily as needed. For constipation    . SUCRALFATE 1 GM/10ML PO SUSP Oral Take 10 mLs (1 g total) by mouth 4 (four) times daily -  with meals and at bedtime. 420 mL 2    BP 134/70  Pulse 86  Temp 98.3 F (36.8 C) (Oral)  Resp 20  SpO2 97%  Physical Exam  Nursing note and vitals reviewed.  obese 61 year old male in no acute distress. Vital signs are normal. Oxygen saturation is 97% which is normal. Head is normocephalic and atraumatic. PERRLA, EOMI. There is no scleral icterus. Mucous membranes are moist. Neck is nontender and supple. Back is nontender. Lungs are clear without rales,  wheezes, rhonchi. Heart has regular rate rhythm without murmur. Abdomen is soft, flat, without masses or hepatosplenomegaly. There is mild periumbilical tenderness. Peristalsis is decreased. Extremities have no cyanosis or edema, full range of motion is present. Skin is warm and dry without rash. Neurologic: Mental status is normal, cranial nerves are intact, there are no motor or sensory deficits.  ED Course  Procedures (including critical care time)   Labs Reviewed  CBC  DIFFERENTIAL  COMPREHENSIVE METABOLIC PANEL  LIPASE, BLOOD  URINALYSIS, ROUTINE W REFLEX MICROSCOPIC  URINE RAPID DRUG SCREEN (HOSP PERFORMED)   Dg Abd Acute W/chest  10/16/2011  *RADIOLOGY REPORT*  Clinical Data: Left lower quadrant abdominal pain.  Diarrhea.  ACUTE ABDOMEN SERIES (ABDOMEN 2 VIEW & CHEST 1 VIEW)  Comparison: Acute abdominal series 09/21/2011.  Findings: The heart size is normal.  Chronic bronchitic changes are evident bilaterally.  No focal airspace disease is evident.  Supine and upright views of the abdomen demonstrate to air-fluid levels within nondilated bowel.  Postsurgical changes are noted in the lumbar spine.  The patient is status post cholecystectomy.  IMPRESSION:  1.  Fluid levels within nondilated bowel, suggesting an ileus. 2.  Stable chronic bronchitic changes in the lungs bilaterally.  Original Report Authenticated By: Jamesetta Orleans. MATTERN, M.D.     1. Abdominal pain   2. Nausea and vomiting       MDM  Abdominal pain and vomiting. I reviewed his records and he has had 13 CT scans of his abdomen. He has multiple visits for abdominal pain. He was admitted last month for possible partial small bowel obstruction. He was seen here go home days ago and had CT scan done at that time. At this point, I feel that if his nausea can be controlled, to be able to be discharged with since yard he has narcotic prescription. It is noted that he has had a prescription for Zofran tablet. He probably should  have a form of an antiemetic to take it he is actively vomiting-either Zofran ODT, or a suppository.  He has had no further vomiting in the emergency department. He had been prescribed promethazine suppository yesterday. I'm not sure if he actually got that filled and that was prescribed prochlorperazine suppository so he would have an alternate method of getting his anti-emetics parenterally.      Dione Booze, MD 10/17/11 [phone]

## 2011-10-17 NOTE — ED Notes (Signed)
Pt still unable to void

## 2011-10-17 NOTE — Discharge Instructions (Signed)
Abdominal Pain Abdominal pain can be caused by many things. Your caregiver decides the seriousness of your pain by an examination and possibly blood tests and X-rays. Many cases can be observed and treated at home. Most abdominal pain is not caused by a disease and will probably improve without treatment. However, in many cases, more time must pass before a clear cause of the pain can be found. Before that point, it may not be known if you need more testing, or if hospitalization or surgery is needed. HOME CARE INSTRUCTIONS   Do not take laxatives unless directed by your caregiver.   Take pain medicine only as directed by your caregiver.   Only take over-the-counter or prescription medicines for pain, discomfort, or fever as directed by your caregiver.   Try a clear liquid diet (broth, tea, or water) for as long as directed by your caregiver. Slowly move to a bland diet as tolerated.  SEEK IMMEDIATE MEDICAL CARE IF:   The pain does not go away.   You have a fever.   You keep throwing up (vomiting).   The pain is felt only in portions of the abdomen. Pain in the right side could possibly be appendicitis. In an adult, pain in the left lower portion of the abdomen could be colitis or diverticulitis.   You pass bloody or black tarry stools.  MAKE SURE YOU:   Understand these instructions.   Will watch your condition.   Will get help right away if you are not doing well or get worse.  Document Released: 01/23/2005 Document Revised: 04/04/2011 Document Reviewed: 12/02/2007 [facility] Patient Information 2012 [place], [place].  Prochlorperazine suppositories What is this medicine? PROCHLORPERAZINE (proe klor PER a zeen) helps to control severe nausea and vomiting. This medicine may be used for other purposes; ask your health care provider or pharmacist if you have questions. What should I tell my health care provider before I take this medicine? They need to know if you have any of these  conditions: -blood disorders or disease -dementia -liver disease or jaundice -Parkinson's disease -uncontrollable movement disorder -an unusual or allergic reaction to prochlorperazine, other medicines, foods, dyes, or preservatives -pregnant or trying to get pregnant -breast-feeding How should I use this medicine? This medicine is for rectal use only. Do not take by mouth. Wash your hands before and after use. Take off the foil wrapping. Wet the tip of the suppository with cold tap water to make it easier to use. Lie on your side with your lower leg straightened out and your upper leg bent forward toward your stomach. Lift upper buttock to expose the rectal area. Apply gentle pressure to insert the suppository completely into the rectum, pointed end first. Hold buttocks together for a few seconds. Remain lying down for about 15 minutes to avoid having the suppository come out. Do not use more often than directed. Talk to your pediatrician regarding the use of this medicine in children. Special care may be needed. While this medicine may be prescribed for children as young as 2 years for selected conditions, precautions do apply. Overdosage: If you think you have taken too much of this medicine contact a poison control center or emergency room at once. NOTE: This medicine is only for you. Do not share this medicine with others. What if I miss a dose? If you miss a dose, use it as soon as you can. If it is almost time for your next dose, use only that dose. Do not use double  or extra doses. What may interact with this medicine? Do not take this medicine with any of the following medications: -amoxapine -antidepressants like citalopram, escitalopram, fluoxetine,paroxetine, and sertraline -deferoxamine -dofetilide -maprotiline -tricyclic antidepressants like amitriptyline, clomipramine, imipramine, nortiptyline and others This medicine may also interact with the following  medications: -lithium -medicines for pain -phenytoin -propranolol -warfarin This list may not describe all possible interactions. Give your health care provider a list of all the medicines, herbs, non-prescription drugs, or dietary supplements you use. Also tell them if you smoke, drink alcohol, or use illegal drugs. Some items may interact with your medicine. What should I watch for while using this medicine? Visit your doctor or health care professional for regular checks on your progress. You may get drowsy or dizzy. Do not drive, use machinery, or do anything that needs mental alertness until you know how this medicine affects you. Do not stand or sit up quickly, especially if you are an older patient. This reduces the risk of dizzy or fainting spells. Alcohol may interfere with the effect of this medicine. Avoid alcoholic drinks. This medicine can reduce the response of your body to heat or cold. Try not to get overheated. Avoid temperature extremes, such as saunas, hot tubs, or very hot or cold baths or showers. Dress warmly in cold weather. This medicine can make you more sensitive to the sun. Keep out of the sun. If you cannot avoid being in the sun, wear protective clothing and use sunscreen. Do not use sun lamps or tanning beds/booths. Your mouth may get dry. Chewing sugarless gum or sucking hard candy, and drinking plenty of water may help. Contact your doctor if the problem does not go away or is severe. What side effects may I notice from receiving this medicine? Side effects that you should report to your doctor or health care professional as soon as possible: -blurred vision -breast enlargement in men or women -breast milk in women who are not breast-feeding -chest pain, fast or irregular heartbeat -confusion, restlessness -dark yellow or brown urine -difficulty breathing or swallowing -dizziness or fainting spells -drooling, shaking, movement difficulty (shuffling walk) or  rigidity -fever, chills, sore throat -involuntary or uncontrollable movements of the eyes, mouth, head, arms, and legs -seizures -stomach area pain -unusually weak or tired -unusual bleeding or bruising -yellowing of skin or eyes Side effects that usually do not require medical attention (report to your doctor or health care professional if they continue or are bothersome): -difficulty passing urine -difficulty sleeping -headache -sexual dysfunction -skin rash, or itching This list may not describe all possible side effects. Call your doctor for medical advice about side effects. You may report side effects to FDA at 1-800-FDA-1088. Where should I keep my medicine? Keep out of the reach of children. Store at room temperature between 15 and 30 degrees C (59 and 86 degrees F). Throw away any unused medicine after the expiration date. NOTE: This sheet is a summary. It may not cover all possible information. If you have questions about this medicine, talk to your doctor, pharmacist, or health care provider.  2012, [employer]. (11/16/2007 4:28:51 PM)

## 2011-10-17 NOTE — ED Notes (Signed)
Patient states he needs more pain med.  Dr. Preston Fleeting notified.

## 2011-10-17 NOTE — ED Notes (Signed)
Continues to state his pain is about a 10.  Offered ice chips for his "dry mouth" but he refused

## 2011-10-17 NOTE — ED Notes (Signed)
PT. REPORTS PERSISTENT GENERALIZED ABDOMINAL PAIN  WITH VOMITTING  FOR 2 DAYS , SEEN HERE YESTERDAY PRESCRIBED WITH MEDICATIONS WITH NO RELIEF.

## 2011-10-17 NOTE — ED Provider Notes (Signed)
Medical screening examination/treatment/procedure(s) were performed by non-physician practitioner and as supervising physician I was immediately available for consultation/collaboration.  Cambryn Charters, MD 10/17/11 0909 

## 2011-10-17 NOTE — ED Notes (Signed)
Pt unable to void urinal at bedside pt is aware of needed void

## 2011-10-18 ENCOUNTER — Encounter (HOSPITAL_COMMUNITY): Payer: Self-pay | Admitting: *Deleted

## 2011-10-18 ENCOUNTER — Observation Stay (HOSPITAL_COMMUNITY)
Admission: EM | Admit: 2011-10-18 | Discharge: 2011-10-22 | Disposition: A | Discharge status: Home / Self Care | Payer: Medicare Other | Attending: Internal Medicine | Admitting: Internal Medicine

## 2011-10-18 DIAGNOSIS — E119 Type 2 diabetes mellitus without complications: Secondary | ICD-10-CM | POA: Insufficient documentation

## 2011-10-18 DIAGNOSIS — I10 Essential (primary) hypertension: Secondary | ICD-10-CM | POA: Insufficient documentation

## 2011-10-18 DIAGNOSIS — R109 Unspecified abdominal pain: Principal | ICD-10-CM | POA: Insufficient documentation

## 2011-10-18 DIAGNOSIS — I2510 Atherosclerotic heart disease of native coronary artery without angina pectoris: Secondary | ICD-10-CM | POA: Insufficient documentation

## 2011-10-18 DIAGNOSIS — K259 Gastric ulcer, unspecified as acute or chronic, without hemorrhage or perforation: Secondary | ICD-10-CM | POA: Diagnosis present

## 2011-10-18 DIAGNOSIS — R1115 Cyclical vomiting syndrome unrelated to migraine: Secondary | ICD-10-CM | POA: Insufficient documentation

## 2011-10-18 DIAGNOSIS — Z8711 Personal history of peptic ulcer disease: Secondary | ICD-10-CM | POA: Insufficient documentation

## 2011-10-18 DIAGNOSIS — F1120 Opioid dependence, uncomplicated: Secondary | ICD-10-CM | POA: Insufficient documentation

## 2011-10-18 DIAGNOSIS — Z86718 Personal history of other venous thrombosis and embolism: Secondary | ICD-10-CM | POA: Insufficient documentation

## 2011-10-18 DIAGNOSIS — Z8719 Personal history of other diseases of the digestive system: Secondary | ICD-10-CM

## 2011-10-18 DIAGNOSIS — Z9861 Coronary angioplasty status: Secondary | ICD-10-CM | POA: Insufficient documentation

## 2011-10-18 DIAGNOSIS — E785 Hyperlipidemia, unspecified: Secondary | ICD-10-CM | POA: Insufficient documentation

## 2011-10-18 DIAGNOSIS — R112 Nausea with vomiting, unspecified: Secondary | ICD-10-CM | POA: Diagnosis present

## 2011-10-18 NOTE — ED Notes (Signed)
Pt was seen here on Wed for abd pain and nausea.  He states he has been using the supositories prescribed but continues to vomit.

## 2011-10-19 ENCOUNTER — Encounter (HOSPITAL_COMMUNITY): Payer: Self-pay | Admitting: Radiology

## 2011-10-19 ENCOUNTER — Emergency Department (HOSPITAL_COMMUNITY): Payer: Medicare Other

## 2011-10-19 DIAGNOSIS — R112 Nausea with vomiting, unspecified: Secondary | ICD-10-CM

## 2011-10-19 DIAGNOSIS — E118 Type 2 diabetes mellitus with unspecified complications: Secondary | ICD-10-CM

## 2011-10-19 DIAGNOSIS — E1165 Type 2 diabetes mellitus with hyperglycemia: Secondary | ICD-10-CM

## 2011-10-19 DIAGNOSIS — R1013 Epigastric pain: Secondary | ICD-10-CM

## 2011-10-19 LAB — CBC
HCT: 44 % (ref 39.0–52.0)
MCH: 30.9 pg (ref 26.0–34.0)
MCV: 93 fL (ref 78.0–100.0)
Platelets: 201 10*3/uL (ref 150–400)
RDW: 13.4 % (ref 11.5–15.5)

## 2011-10-19 LAB — URINE MICROSCOPIC-ADD ON

## 2011-10-19 LAB — DIFFERENTIAL
Basophils Absolute: 0 10*3/uL (ref 0.0–0.1)
Eosinophils Absolute: 0.1 10*3/uL (ref 0.0–0.7)
Eosinophils Relative: 2 % (ref 0–5)
Monocytes Absolute: 0.4 10*3/uL (ref 0.1–1.0)

## 2011-10-19 LAB — URINALYSIS, ROUTINE W REFLEX MICROSCOPIC
Hgb urine dipstick: NEGATIVE
Protein, ur: NEGATIVE mg/dL
Specific Gravity, Urine: 1.025 (ref 1.005–1.030)
Urobilinogen, UA: 1 mg/dL (ref 0.0–1.0)

## 2011-10-19 LAB — GLUCOSE, CAPILLARY
Glucose-Capillary: 181 mg/dL — ABNORMAL HIGH (ref 70–99)
Glucose-Capillary: 232 mg/dL — ABNORMAL HIGH (ref 70–99)
Glucose-Capillary: 221 mg/dL — ABNORMAL HIGH (ref 70–99)

## 2011-10-19 LAB — COMPREHENSIVE METABOLIC PANEL
ALT: 10 U/L (ref 0–53)
AST: 9 U/L (ref 0–37)
Calcium: 9.1 mg/dL (ref 8.4–10.5)
Creatinine, Ser: 0.6 mg/dL (ref 0.50–1.35)
GFR calc Af Amer: >90 mL/min (ref >90)
GFR calc non Af Amer: >90 mL/min (ref >90)
Glucose, Bld: 244 mg/dL — ABNORMAL HIGH (ref 70–99)
Sodium: 138 mEq/L (ref 135–145)
Total Protein: 6.8 g/dL (ref 6.0–8.3)

## 2011-10-19 LAB — LACTIC ACID, PLASMA: Lactic Acid, Venous: 1.3 mmol/L (ref 0.5–2.2)

## 2011-10-19 MED ORDER — SODIUM CHLORIDE 0.9 % IV BOLUS (SEPSIS)
1000.0000 mL | Freq: Once | INTRAVENOUS | Status: AC
  Administered 2011-10-19: 1000 mL via INTRAVENOUS

## 2011-10-19 MED ORDER — LOSARTAN POTASSIUM 50 MG PO TABS
100.0000 mg | ORAL_TABLET | Freq: Every day | ORAL | Status: DC
  Administered 2011-10-19 – 2011-10-22 (×4): 100 mg via ORAL
  Filled 2011-10-19 (×4): qty 2

## 2011-10-19 MED ORDER — ONDANSETRON HCL 4 MG/2ML IJ SOLN
4.0000 mg | Freq: Once | INTRAMUSCULAR | Status: AC
  Administered 2011-10-19: 4 mg via INTRAVENOUS
  Filled 2011-10-19: qty 2

## 2011-10-19 MED ORDER — IOHEXOL 300 MG/ML  SOLN
100.0000 mL | Freq: Once | INTRAMUSCULAR | Status: AC | PRN
  Administered 2011-10-19: 100 mL via INTRAVENOUS

## 2011-10-19 MED ORDER — OXYCODONE HCL 5 MG PO TABS
30.0000 mg | ORAL_TABLET | ORAL | Status: DC | PRN
  Administered 2011-10-19 – 2011-10-22 (×18): 30 mg via ORAL
  Filled 2011-10-19 (×18): qty 6

## 2011-10-19 MED ORDER — TRAMADOL HCL 50 MG PO TABS
100.0000 mg | ORAL_TABLET | Freq: Four times a day (QID) | ORAL | Status: DC | PRN
  Administered 2011-10-19: 100 mg via ORAL
  Filled 2011-10-19 (×3): qty 2

## 2011-10-19 MED ORDER — DEXTROSE 5 % IV SOLN
1.0000 g | Freq: Once | INTRAVENOUS | Status: AC
  Administered 2011-10-19: 1 g via INTRAVENOUS
  Filled 2011-10-19: qty 10

## 2011-10-19 MED ORDER — HYDROMORPHONE HCL PF 1 MG/ML IJ SOLN
1.0000 mg | INTRAMUSCULAR | Status: AC
  Administered 2011-10-19: 1 mg via INTRAVENOUS
  Filled 2011-10-19: qty 1

## 2011-10-19 MED ORDER — HYDROMORPHONE HCL PF 1 MG/ML IJ SOLN
1.0000 mg | Freq: Once | INTRAMUSCULAR | Status: AC
  Administered 2011-10-19: 1 mg via INTRAVENOUS
  Filled 2011-10-19: qty 1

## 2011-10-19 MED ORDER — IOHEXOL 300 MG/ML  SOLN
20.0000 mL | INTRAMUSCULAR | Status: DC
  Administered 2011-10-19: 20 mL via ORAL

## 2011-10-19 MED ORDER — ONDANSETRON HCL 4 MG/2ML IJ SOLN: 4.0000 mg | Freq: Three times a day (TID) | INTRAMUSCULAR | Status: AC | PRN

## 2011-10-19 MED ORDER — PANTOPRAZOLE SODIUM 40 MG IV SOLR
40.0000 mg | INTRAVENOUS | Status: DC
  Administered 2011-10-19 – 2011-10-22 (×4): 40 mg via INTRAVENOUS
  Filled 2011-10-19 (×4): qty 40

## 2011-10-19 MED ORDER — GABAPENTIN 400 MG PO CAPS
400.0000 mg | ORAL_CAPSULE | Freq: Three times a day (TID) | ORAL | Status: DC
  Administered 2011-10-19 – 2011-10-22 (×10): 400 mg via ORAL
  Filled 2011-10-19 (×12): qty 1

## 2011-10-19 MED ORDER — INSULIN ASPART 100 UNIT/ML ~~LOC~~ SOLN
0.0000 [IU] | SUBCUTANEOUS | Status: DC
  Administered 2011-10-19: 5 [IU] via SUBCUTANEOUS
  Administered 2011-10-19: 8 [IU] via SUBCUTANEOUS
  Administered 2011-10-19: 3 [IU] via SUBCUTANEOUS
  Administered 2011-10-19: 5 [IU] via SUBCUTANEOUS
  Administered 2011-10-20: 8 [IU] via SUBCUTANEOUS

## 2011-10-19 MED ORDER — GUAIFENESIN-DM 100-10 MG/5ML PO SYRP
5.0000 mL | ORAL_SOLUTION | ORAL | Status: DC | PRN
  Filled 2011-10-19: qty 5

## 2011-10-19 MED ORDER — PANTOPRAZOLE SODIUM 40 MG IV SOLR
40.0000 mg | Freq: Two times a day (BID) | INTRAVENOUS | Status: DC
  Filled 2011-10-19 (×2): qty 40

## 2011-10-19 MED ORDER — HYDROMORPHONE HCL PF 1 MG/ML IJ SOLN
1.0000 mg | Freq: Four times a day (QID) | INTRAMUSCULAR | Status: DC | PRN
  Administered 2011-10-19 – 2011-10-22 (×13): 1 mg via INTRAVENOUS
  Filled 2011-10-19 (×14): qty 1

## 2011-10-19 MED ORDER — PROCHLORPERAZINE 25 MG RE SUPP
25.0000 mg | Freq: Two times a day (BID) | RECTAL | Status: DC | PRN
  Filled 2011-10-19: qty 1

## 2011-10-19 MED ORDER — ONDANSETRON HCL 4 MG/2ML IJ SOLN
4.0000 mg | Freq: Four times a day (QID) | INTRAMUSCULAR | Status: DC | PRN
  Administered 2011-10-21: 4 mg via INTRAVENOUS
  Filled 2011-10-19: qty 2

## 2011-10-19 MED ORDER — ALBUTEROL SULFATE (5 MG/ML) 0.5% IN NEBU: 2.5000 mg | INHALATION_SOLUTION | RESPIRATORY_TRACT | Status: DC | PRN

## 2011-10-19 MED ORDER — ONDANSETRON HCL 4 MG PO TABS: 4.0000 mg | ORAL_TABLET | Freq: Four times a day (QID) | ORAL | Status: DC | PRN

## 2011-10-19 MED ORDER — SUCRALFATE 1 GM/10ML PO SUSP
1.0000 g | Freq: Three times a day (TID) | ORAL | Status: DC
  Administered 2011-10-19 – 2011-10-22 (×14): 1 g via ORAL
  Filled 2011-10-19 (×18): qty 10

## 2011-10-19 MED ORDER — DOCUSATE SODIUM 100 MG PO CAPS
100.0000 mg | ORAL_CAPSULE | Freq: Two times a day (BID) | ORAL | Status: DC
  Administered 2011-10-19 – 2011-10-22 (×7): 100 mg via ORAL
  Filled 2011-10-19 (×7): qty 1

## 2011-10-19 MED ORDER — HYDROCODONE-ACETAMINOPHEN 5-325 MG PO TABS: 1.0000 | ORAL_TABLET | ORAL | Status: DC | PRN

## 2011-10-19 MED ORDER — ALUM & MAG HYDROXIDE-SIMETH 200-200-20 MG/5ML PO SUSP: 30.0000 mL | Freq: Four times a day (QID) | ORAL | Status: DC | PRN

## 2011-10-19 MED ORDER — PANTOPRAZOLE SODIUM 40 MG PO TBEC
40.0000 mg | DELAYED_RELEASE_TABLET | Freq: Two times a day (BID) | ORAL | Status: DC
  Administered 2011-10-19: 40 mg via ORAL
  Filled 2011-10-19: qty 1

## 2011-10-19 MED ORDER — ALPRAZOLAM 0.5 MG PO TABS
1.0000 mg | ORAL_TABLET | Freq: Two times a day (BID) | ORAL | Status: DC
  Administered 2011-10-19 – 2011-10-22 (×7): 1 mg via ORAL
  Filled 2011-10-19 (×7): qty 2

## 2011-10-19 MED ORDER — INSULIN GLARGINE 100 UNIT/ML ~~LOC~~ SOLN
10.0000 [IU] | Freq: Every day | SUBCUTANEOUS | Status: DC
  Administered 2011-10-19: 10 [IU] via SUBCUTANEOUS

## 2011-10-19 MED ORDER — SENNOSIDES-DOCUSATE SODIUM 8.6-50 MG PO TABS: 1.0000 | ORAL_TABLET | Freq: Two times a day (BID) | ORAL | Status: DC | PRN

## 2011-10-19 MED ORDER — ACETAMINOPHEN 650 MG RE SUPP: 650.0000 mg | Freq: Four times a day (QID) | RECTAL | Status: DC | PRN

## 2011-10-19 MED ORDER — ACETAMINOPHEN 325 MG PO TABS: 650.0000 mg | ORAL_TABLET | Freq: Four times a day (QID) | ORAL | Status: DC | PRN

## 2011-10-19 MED ORDER — SODIUM CHLORIDE 0.9 % IV SOLN
INTRAVENOUS | Status: AC
  Administered 2011-10-19: 07:00:00 via INTRAVENOUS

## 2011-10-19 MED ORDER — METOCLOPRAMIDE HCL 5 MG/ML IJ SOLN
10.0000 mg | Freq: Once | INTRAMUSCULAR | Status: AC
  Administered 2011-10-19: 10 mg via INTRAVENOUS
  Filled 2011-10-19: qty 2

## 2011-10-19 MED ORDER — SODIUM CHLORIDE 0.9 % IV SOLN: INTRAVENOUS | Status: AC

## 2011-10-19 NOTE — ED Notes (Signed)
Admitting MD at bedside.

## 2011-10-19 NOTE — ED Provider Notes (Signed)
History     CSN: [id number]  Arrival date & time 10/18/11  2110   First MD Initiated Contact with Patient 10/18/11 2359      Chief Complaint  Patient presents with  . Abdominal Pain  . Emesis    (Consider location/radiation/quality/duration/timing/severity/associated sxs/prior treatment) HPI Comments: Patient history of diabetes. Has been seen multiple times in the past week for abdominal pain, nausea, vomiting. Has been unable to tolerate his medications as the prescribed antibiotics have been ineffective. Presents with continued symptoms  Patient is a 61 y.o. male presenting with abdominal pain. The history is provided by the patient. No language interpreter was used.  Abdominal Pain The primary symptoms of the illness include abdominal pain, fatigue, nausea, vomiting and diarrhea. The primary symptoms of the illness do not include fever, shortness of breath or dysuria. The current episode started more than 2 days ago. The onset of the illness was gradual. The problem has been gradually worsening.  The abdominal pain began more than 2 days ago. The pain came on gradually. The abdominal pain has been gradually worsening since its onset. The abdominal pain is generalized. The abdominal pain does not radiate. The abdominal pain is relieved by nothing. The abdominal pain is exacerbated by movement and eating.  Nausea began 3 to 5 days ago. The nausea is associated with eating and prescription drugs. The nausea is exacerbated by food.  The vomiting began more than 2 days ago. Vomiting occurs 6 to 10 times per day. The emesis contains stomach contents.  Symptoms associated with the illness do not include chills, constipation, urgency, frequency or back pain.    Past Medical History  Diagnosis Date  . Hypertension   . Diabetes mellitus   . DVT (deep venous thrombosis)   . Chronic back pain   . 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 Hyperlipidemia   . CAD  (coronary atherosclerotic disease)     s/p stents x2  . Small bowel obstruction     Past Surgical History  Procedure Date  . Heart stent   . Tonsillectomy and adenoidectomy   . Cholecystectomy   . Back surgery   . Esophagogastroduodenoscopy 09/22/2011    Procedure: ESOPHAGOGASTRODUODENOSCOPY (EGD);  Surgeon: Charna Elizabeth, MD;  Location: [facility] ENDOSCOPY;  Service: Endoscopy;  Laterality: N/A;    Family History  Problem Relation Age of Onset  . Diabetes Father     History  Substance Use Topics  . Smoking status: Current Some Day Smoker -- 0.2 packs/day for 30 years    Types: Cigarettes  . Smokeless tobacco: Never Used  . Alcohol Use: No      Review of Systems  Constitutional: Positive for fatigue. Negative for fever, chills, activity change and appetite change.  HENT: Negative for congestion, sore throat, rhinorrhea, neck pain and neck stiffness.   Respiratory: Negative for cough and shortness of breath.   Cardiovascular: Negative for chest pain and palpitations.  Gastrointestinal: Positive for nausea, vomiting, abdominal pain and diarrhea. Negative for constipation.  Genitourinary: Negative for dysuria, urgency, frequency and flank pain.  Musculoskeletal: Negative for myalgias, back pain and arthralgias.  Neurological: Positive for weakness. Negative for dizziness, light-headedness, numbness and headaches.  All other systems reviewed and are negative.    Allergies  Lisinopril and Morphine and related  Home Medications   Current Outpatient Rx  Name Route Sig Dispense Refill  . ALPRAZOLAM 1 MG PO TABS Oral Take 1 tablet (1 mg total) by mouth  2 (two) times daily. 10 tablet 0  . ASPIRIN EC 81 MG PO TBEC Oral Take 81 mg by mouth daily.    Marland Kitchen GABAPENTIN 400 MG PO CAPS Oral Take 1 capsule (400 mg total) by mouth 3 (three) times daily. 20 capsule 0  . GLIPIZIDE ER 10 MG PO TB24 Oral Take 1 tablet (10 mg total) by mouth 2 (two) times daily. 11 tablet 0  . INSULIN GLARGINE 100  UNIT/ML Tappen SOLN Subcutaneous Inject 10 Units into the skin at bedtime. 10 mL 1  . LOSARTAN POTASSIUM 100 MG PO TABS Oral Take 1 tablet (100 mg total) by mouth daily. 30 tablet 0  . METFORMIN HCL 850 MG PO TABS Oral Take 850 mg by mouth 3 (three) times daily.    Marland Kitchen ONDANSETRON HCL 4 MG PO TABS Oral Take 1 tablet (4 mg total) by mouth every 6 (six) hours as needed. For nausea 20 tablet 0  . OXYCODONE HCL 30 MG PO TABS Oral Take 1 tablet (30 mg total) by mouth every 4 (four) hours as needed. For pain 30 tablet 0  . PANTOPRAZOLE SODIUM 40 MG PO TBEC Oral Take 1 tablet (40 mg total) by mouth daily at 12 noon. 60 tablet 0  . PROCHLORPERAZINE 25 MG RE SUPP Rectal Place 1 suppository (25 mg total) rectally every 12 (twelve) hours as needed for nausea. 12 suppository 0  . PROMETHAZINE HCL 25 MG RE SUPP Rectal Place 1 suppository (25 mg total) rectally every 6 (six) hours as needed for nausea. 12 each 0  . SENNA-DOCUSATE SODIUM 8.6-50 MG PO TABS Oral Take 1 tablet by mouth 2 (two) times daily as needed. For constipation    . SUCRALFATE 1 GM/10ML PO SUSP Oral Take 10 mLs (1 g total) by mouth 4 (four) times daily -  with meals and at bedtime. 420 mL 2    BP 135/78  Pulse 67  Temp 98.7 F (37.1 C) (Oral)  Resp 22  SpO2 99%  Physical Exam  Nursing note and vitals reviewed. Constitutional: He is oriented to person, place, and time. He appears well-developed and well-nourished. No distress.  HENT:  Head: Normocephalic and atraumatic.  Mouth/Throat: Oropharynx is clear and moist. No oropharyngeal exudate.       Dry MM  Eyes: Conjunctivae and EOM are normal. Pupils are equal, round, and reactive to light.  Neck: Normal range of motion. Neck supple.  Cardiovascular: Normal rate, regular rhythm, normal heart sounds and intact distal pulses.  Exam reveals no gallop and no friction rub.   No murmur heard. Pulmonary/Chest: Effort normal and breath sounds normal. No respiratory distress. He exhibits no  tenderness.  Abdominal: Soft. Bowel sounds are normal. There is tenderness (diffuse pain). There is no rebound and no guarding.  Musculoskeletal: Normal range of motion. He exhibits no edema and no tenderness.  Neurological: He is alert and oriented to person, place, and time. No cranial nerve deficit.  Skin: Skin is warm and dry.    ED Course  Procedures (including critical care time)   Date: 10/19/2011  Rate: 67  Rhythm: normal sinus rhythm  QRS Axis: normal  Intervals: PR prolonged  ST/T Wave abnormalities: normal  Conduction Disutrbances:first-degree A-V block   Narrative Interpretation:   Old EKG Reviewed: unchanged  Labs Reviewed  DIFFERENTIAL - Abnormal; Notable for the following:    Neutrophils Relative 39 (*)     Lymphocytes Relative 50 (*)     All other components within normal limits  COMPREHENSIVE  METABOLIC PANEL - Abnormal; Notable for the following:    Glucose, Bld 244 (*)     All other components within normal limits  URINALYSIS, ROUTINE W REFLEX MICROSCOPIC - Abnormal; Notable for the following:    APPearance CLOUDY (*)     Glucose, UA 500 (*)     Leukocytes, UA TRACE (*)     All other components within normal limits  GLUCOSE, CAPILLARY - Abnormal; Notable for the following:    Glucose-Capillary 227 (*)     All other components within normal limits  URINE MICROSCOPIC-ADD ON - Abnormal; Notable for the following:    Crystals CA OXALATE CRYSTALS (*)     All other components within normal limits  CBC  LIPASE, BLOOD  LACTIC ACID, PLASMA   Ct Abdomen Pelvis W Contrast  10/19/2011  *RADIOLOGY REPORT*  Clinical Data: Diffuse abdominal pain, nausea, vomiting and diarrhea.  CT ABDOMEN AND PELVIS WITH CONTRAST  Technique:  Multidetector CT imaging of the abdomen and pelvis was performed following the standard protocol during bolus administration of intravenous contrast.  Contrast: OMNIPAQUE IOHEXOL 300 MG/ML  SOLN  Comparison: CT of the abdomen and pelvis  performed 10/11/2011  Findings: Minimal bibasilar atelectasis is noted.  Prominence of the intrahepatic biliary ducts likely reflects prior cholecystectomy, with clips noted at the gallbladder fossa.  The liver is otherwise unremarkable in appearance.  The spleen is within normal limits.  The pancreas is grossly unremarkable.  A 2.2 cm right adrenal adenoma is again noted.  The left adrenal gland is unremarkable in appearance.  A vague 2.5 cm hypodense lesion within the medial left kidney remains nondescript, first characterized around 2011.  It remains relatively stable in size, but malignancy cannot be entirely excluded.  A smaller 1.0 cm hypodensity is noted at the interpole region of the left kidney.  MRI could be considered for further evaluation, as previously recommended.  No free fluid is identified.  The small bowel is unremarkable in appearance.  The stomach is within normal limits.  No acute vascular abnormalities are seen.  Scattered calcification is noted along the distal abdominal aorta and its branches.  The appendix is normal in caliber and contains air, without evidence for appendicitis.  The colon is unremarkable in appearance.  The bladder is mildly distended and grossly unremarkable in appearance.  The prostate remains borderline normal in size, with scattered calcification.  No inguinal lymphadenopathy is seen.  No acute osseous abnormalities are identified.  The patient is status post posterior lumbar spinal fusion at L4-L5, with associated decompression.  IMPRESSION:  1.  No acute abnormalities seen within the abdomen or pelvis. 2.  2.5 cm left renal lesion is nonspecific, first characterized around 2011.  It remains relatively stable in size, but malignancy cannot be excluded.  Smaller 1.0 cm renal hypodensity also seen. MRI with and without contrast would be helpful for further evaluation, when and as deemed clinically appropriate. 3.  2.2 cm right adrenal adenoma seen. 4.  Scattered  calcification along the distal abdominal aorta and its branches.  Original Report Authenticated By: Tonia Ghent, M.D.   Dg Abd Acute W/chest  10/17/2011  *RADIOLOGY REPORT*  Clinical Data: Lower abdominal pain for several months; vomiting.  ACUTE ABDOMEN SERIES (ABDOMEN 2 VIEW & CHEST 1 VIEW)  Comparison: Chest and abdominal radiographs performed 10/16/2011  Findings: The lungs are well-aerated.  Chronic bronchitic changes are grossly unchanged in appearance, with underlying chronic vascular congestion.  There is no evidence of pleural effusion or  pneumothorax.  The cardiomediastinal silhouette is within normal limits.  The visualized bowel gas pattern is nonspecific.  There is mild distension of small bowel loops to 3.3 cm in maximal diameter, without definite evidence of obstruction.  Air and stool are seen within the colon.  No free intra-abdominal air is identified on the provided upright view.  No acute osseous abnormalities are seen; the sacroiliac joints are unremarkable in appearance.  Clips are noted within the right upper quadrant, reflecting prior cholecystectomy.  Chronic left-sided rib deformity is seen.  Lumbar spinal fusion hardware is noted.  IMPRESSION:  1.  Nonspecific bowel gas pattern; mild distension of small bowel loops, without evidence for obstruction.  No free intra-abdominal air seen. 2.  Chronic bronchitic changes again noted, with underlying chronic vascular congestion.  No acute cardiopulmonary process seen.  Original Report Authenticated By: Tonia Ghent, M.D.     1. Abdominal pain   2. Intractable nausea and vomiting       MDM  Abdominal pain and intractable nausea and vomiting. Unable to tolerate his medications at home. His had multiple emergency department visits in the past week. Will require admission to the hospital. He's had difficulty tolerating oral intake in the emergency department. Discussed with the [facility] patient for [facility]. I  believe the etiology is likely gastroparesis although this is not formally diagnosed        Dayton Bailiff, MD 10/19/11 [phone]

## 2011-10-19 NOTE — ED Notes (Signed)
Patient transported to CT 

## 2011-10-19 NOTE — Progress Notes (Signed)
PCP: Warrick Parisian, MD  Brief HPI:  Ryan [name] is a 61 y.o. 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 Presented with 1 wk of abdominal pain nausea and vomiting. With recurrent trips to ER. Patient has had a history of this in the past. He does have narcotic dependence and has frequent trips to emergency department requesting Dilaudid. He presented because of severe epigastric pain nausea and vomiting. He does carry diagnosis of chronic pancreatitis. His lipase was within normal limits. He denied any alcohol use. CT scan showed no pancreatic abnormalities. It did show 2.5 left renal lesions which at some point needs to be followed up by MRI. Did not mention heavy stool burden as he had in the past. Patient does have history of gastric ulcerations which may partially explain his abdominal pain. No fever or chills, he had some diarrhea. He states he vomits his food almost immediately.   Past medical history:  Past Medical History  Diagnosis Date  . Hypertension   . Diabetes mellitus   . DVT (deep venous thrombosis)   . Chronic back pain   . 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 Hyperlipidemia   . CAD (coronary atherosclerotic disease)     s/p stents x2  . Small bowel obstruction     Consultants: None so far  Procedures: None  Subjective: Patient requesting parenteral pain medications due to nausea and vomiting. Says pain is 8-9/10. Sharp pain in upper abdomen. Still with nausea and vomiting. Says he has been taking his medications as prescribed.  Objective: Vital signs in last 24 hours: Temp:  [98.2 F (36.8 C)-99.3 F (37.4 C)] 98.2 F (36.8 C) (06/22 0642) Pulse Rate:  [65-83] 68  (06/22 0642) Resp:  [10-28] 20  (06/22 0642) BP: (123-135)/(59-80) 123/70 mmHg  (06/22 0642) SpO2:  [95 %-100 %] 98 % (06/22 0642) Weight:  [131.543 kg (290 lb)] 131.543 kg (290 lb) (06/22 0647) Weight change:     Intake/Output from previous day: 06/21 0701 - 06/22 0700 In: 1000 [I.V.:1000] Out: -  Intake/Output this shift: Total I/O In: 600 [P.O.:600] Out: -   General appearance: alert, cooperative, appears stated age, no distress and morbidly obese Head: Normocephalic, without obvious abnormality, atraumatic Resp: clear to auscultation bilaterally Cardio: regular rate and rhythm, S1, S2 normal, no murmur, click, rub or gallop GI: soft, mildly tender in upper abdomen; bowel sounds normal; no masses,  no organomegaly Extremities: extremities normal, atraumatic, no cyanosis or edema Skin: Skin color, texture, turgor normal. No rashes or lesions Neurologic: Grossly normal  Lab Results:  Basename 10/19/11 0030 10/17/11 0409  WBC 4.6 6.3  HGB 14.6 14.9  HCT 44.0 43.7  PLT 201 209   BMET  Basename 10/19/11 0030 10/17/11 0409  NA 138 133*  K 3.6 3.1*  CL 105 96  CO2 22 25  GLUCOSE 244* 339*  BUN 10 17  CREATININE 0.60 0.89  CALCIUM 9.1 9.0  ALT 10 13    Studies/Results: Ct Abdomen Pelvis W Contrast  10/19/2011  *RADIOLOGY REPORT*  Clinical Data: Diffuse abdominal pain, nausea, vomiting and diarrhea.  CT ABDOMEN AND PELVIS WITH CONTRAST  Technique:  Multidetector CT imaging of the abdomen and pelvis was performed following the standard protocol during bolus administration of intravenous contrast.  Contrast: OMNIPAQUE IOHEXOL 300 MG/ML  SOLN  Comparison:  CT of the abdomen and pelvis performed 10/11/2011  Findings: Minimal bibasilar atelectasis is noted.  Prominence of the intrahepatic biliary ducts likely reflects prior cholecystectomy, with clips noted at the gallbladder fossa.  The liver is otherwise unremarkable in appearance.  The spleen is within normal limits.  The pancreas is grossly unremarkable.  A 2.2 cm right adrenal adenoma is again  noted.  The left adrenal gland is unremarkable in appearance.  A vague 2.5 cm hypodense lesion within the medial left kidney remains nondescript, first characterized around 2011.  It remains relatively stable in size, but malignancy cannot be entirely excluded.  A smaller 1.0 cm hypodensity is noted at the interpole region of the left kidney.  MRI could be considered for further evaluation, as previously recommended.  No free fluid is identified.  The small bowel is unremarkable in appearance.  The stomach is within normal limits.  No acute vascular abnormalities are seen.  Scattered calcification is noted along the distal abdominal aorta and its branches.  The appendix is normal in caliber and contains air, without evidence for appendicitis.  The colon is unremarkable in appearance.  The bladder is mildly distended and grossly unremarkable in appearance.  The prostate remains borderline normal in size, with scattered calcification.  No inguinal lymphadenopathy is seen.  No acute osseous abnormalities are identified.  The patient is status post posterior lumbar spinal fusion at L4-L5, with associated decompression.  IMPRESSION:  1.  No acute abnormalities seen within the abdomen or pelvis. 2.  2.5 cm left renal lesion is nonspecific, first characterized around 2011.  It remains relatively stable in size, but malignancy cannot be excluded.  Smaller 1.0 cm renal hypodensity also seen. MRI with and without contrast would be helpful for further evaluation, when and as deemed clinically appropriate. 3.  2.2 cm right adrenal adenoma seen. 4.  Scattered calcification along the distal abdominal aorta and its branches.  Original Report Authenticated By: Tonia Ghent, M.D.    Medications:  Scheduled:    . sodium chloride   Intravenous STAT  . ALPRAZolam  1 mg Oral BID  . cefTRIAXone (ROCEPHIN)  IV  1 g Intravenous Once  . docusate sodium  100 mg Oral BID  . gabapentin  400 mg Oral TID  .  HYDROmorphone (DILAUDID)  injection  1 mg Intravenous Once  .  HYDROmorphone (DILAUDID) injection  1 mg Intravenous Once  .  HYDROmorphone (DILAUDID) injection  1 mg Intravenous STAT  . insulin aspart  0-15 Units Subcutaneous Q4H  . insulin glargine  10 Units Subcutaneous QHS  . losartan  100 mg Oral Daily  . metoCLOPramide (REGLAN) injection  10 mg Intravenous Once  . ondansetron  4 mg Intravenous Once  . pantoprazole (PROTONIX) IV  40 mg Intravenous Q24H  . sodium chloride  1,000 mL Intravenous Once  . sucralfate  1 g Oral TID WC & HS  . DISCONTD: iohexol  20 mL Oral Q1 Hr x 2  . DISCONTD: pantoprazole  40 mg Oral BID AC  . DISCONTD: pantoprazole (PROTONIX) IV  40 mg Intravenous Q12H   Continuous:    . sodium chloride 75 mL/hr at 10/19/11 0981   XBJ:[id number], acetaminophen, albuterol, alum & mag hydroxide-simeth, guaiFENesin-dextromethorphan, HYDROmorphone (DILAUDID) injection, iohexol, ondansetron (ZOFRAN) IV, ondansetron (ZOFRAN) IV, ondansetron, oxycodone, prochlorperazine, senna-docusate, traMADol, DISCONTD: HYDROcodone-acetaminophen  Assessment/Plan:  Active Problems:  DIABETES MELLITUS, TYPE II  HYPERTENSION  CORONARY ARTERY DISEASE  PANCREATITIS, HX OF  Abdominal pain  Gastric ulcer  Narcotic dependence  Nausea and vomiting    Chronic Abdominal pain Per patient this has been ongoing for 1 year. Etiology remains unclear. Multiple CT scans have not shown any abnormalities. Patient was seen by GI last admission and he underwent EGD which showed Gastric ulcerations. Since he is actively vomiting will leave him on low dose dilaudid till his symptoms are controlled.   Nausea and Vomiting Differential diagnosis includes Gastroparesis, Gastritis. Will order GES.  PUD Continue PPI. IV for now. Will switch to oral when his nausea is controlled. Needs repeat EGD in July.  DIABETES MELLITUS, TYPE II Not well controlled. Last HBA1C was 11.6 in March 2013. Continue lantus and SSI. Adjust as  needed.  HYPERTENSION Continue home medications   History of CORONARY ARTERY DISEASE Stable. Chest pain-free   Narcotic dependence Patient would benefit from referral to pain clinic. Would defer to PCP  Code Status Full Code  DVT Prophylaxis SCD's   LOS: 1 day   [facility]  [facility] Pager [phone] 10/19/2011, 10:26 AM

## 2011-10-19 NOTE — H&P (Signed)
PCP:   Warrick Parisian, MD [facility]   Chief Complaint:   Abdominal pain and nausea  HPI: Ryan [name] is a 61 y.o. 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   Presented with  1 wk of abdominal pain nausea and vomiting. With recurrant trips to ER. Patient has had a history of this in the past. He does have narcotic dependence and has frequent trips to emerge department requesting.Dilaudid. He supposed to have had pain medication contract his primary care provider. He presents today DM because of severe epigastric pain nausea and vomiting. In the past week he have had 5 treatments to emerge department with the same symptoms and has been discharged home. He does carry diagnosis of chronic pancreatitis his lipase today within normal limits. He denies any alcohol use. CT scan showed no pancreatic abnormalities. It did show 2.5 left renal lesions which at some point needs to be followed up by MRI. Did not mention heavy stool burden as he had in the past. Patient does have history of gastric ulcerations which may partially explain his abdominal pain. During interview he perseverating on receiving pain medications and dilaudid in  particular No fever or chills, he had some diarrhea today. He states he vomits his food almost immediately.   Review of Systems:    Pertinent positives include: weight loss  abdominal pain, nausea, vomiting, diarrhea,   Constitutional:  No weight loss, night sweats, Fevers, chills, fatigue, HEENT:  No headaches, Difficulty swallowing,Tooth/dental problems,Sore throat,  No sneezing, itching, ear ache, nasal congestion, post nasal drip,  Cardio-vascular:  No chest pain, Orthopnea, PND, anasarca, dizziness, palpitations.no Bilateral lower extremity swelling  GI:  No heartburn,  indigestion,change in bowel habits, loss of appetite, melena, blood in stool, hematemesis Resp:  no shortness of breath at rest. No dyspnea on exertion, No excess mucus, no productive cough, No non-productive cough, No coughing up of blood.No change in color of mucus.No wheezing. Skin:  no rash or lesions. No jaundice GU:  no dysuria, change in color of urine, no urgency or frequency. No straining to urinate.  No flank pain.  Musculoskeletal:  No joint pain or no joint swelling. No decreased range of motion. No back pain.  Psych:  No change in mood or affect. No depression or anxiety. No memory loss.  Neuro: no localizing neurological complaints, no tingling, no weakness, no double vision, no gait abnormality, no slurred speech, no confusion  Otherwise ROS are negative except for above, 10 systems were reviewed  Past Medical History: Past Medical History  Diagnosis Date  . Hypertension   . Diabetes mellitus   . DVT (deep venous thrombosis)   . Chronic back pain   . 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 Hyperlipidemia   . CAD (coronary atherosclerotic disease)     s/p stents x2  . Small bowel obstruction    Past Surgical History  Procedure Date  . Heart stent   . Tonsillectomy and adenoidectomy   . Cholecystectomy   . Back surgery   . Esophagogastroduodenoscopy 09/22/2011    Procedure: ESOPHAGOGASTRODUODENOSCOPY (EGD);  Surgeon: Charna Elizabeth, MD;  Location: [facility] ENDOSCOPY;  Service: Endoscopy;  Laterality: N/A;     Medications: Prior to Admission medications   Medication Sig Start Date End Date Taking? Authorizing Provider  ALPRAZolam Prudy Feeler) 1 MG tablet  Take 1 tablet (1 mg total) by mouth 2 (two) times daily. 09/25/11  Yes Tora Kindred York, PA  aspirin EC 81 MG tablet Take 81 mg by mouth daily.   Yes Historical Provider, MD  gabapentin (NEURONTIN) 400 MG capsule Take 1 capsule (400 mg total) by mouth 3 (three) times daily. 09/25/11  Yes Marianne L York,  PA  glipiZIDE (GLUCOTROL XL) 10 MG 24 hr tablet Take 1 tablet (10 mg total) by mouth 2 (two) times daily. 09/25/11  Yes Marianne L York, PA  insulin glargine (LANTUS) 100 UNIT/ML injection Inject 10 Units into the skin at bedtime. 09/25/11 09/24/12 Yes Marianne L York, PA  losartan (COZAAR) 100 MG tablet Take 1 tablet (100 mg total) by mouth daily. 09/25/11  Yes Tora Kindred York, PA  metFORMIN (GLUCOPHAGE) 850 MG tablet Take 850 mg by mouth 3 (three) times daily.   Yes Historical Provider, MD  ondansetron (ZOFRAN) 4 MG tablet Take 1 tablet (4 mg total) by mouth every 6 (six) hours as needed. For nausea 09/25/11  Yes Tora Kindred York, PA  oxycodone (ROXICODONE) 30 MG immediate release tablet Take 1 tablet (30 mg total) by mouth every 4 (four) hours as needed. For pain 09/25/11  Yes Tora Kindred York, PA  pantoprazole (PROTONIX) 40 MG tablet Take 1 tablet (40 mg total) by mouth daily at 12 noon. 09/25/11 09/24/12 Yes Tora Kindred York, PA  prochlorperazine (COMPAZINE) 25 MG suppository Place 1 suppository (25 mg total) rectally every 12 (twelve) hours as needed for nausea. 10/17/11 10/24/11 Yes Dione Booze, MD  promethazine (PHENERGAN) 25 MG suppository Place 1 suppository (25 mg total) rectally every 6 (six) hours as needed for nausea. 10/16/11 10/23/11 Yes Bethany Hunt, PA  sennosides-docusate sodium (SENOKOT-S) 8.6-50 MG tablet Take 1 tablet by mouth 2 (two) times daily as needed. For constipation   Yes Historical Provider, MD  sucralfate (CARAFATE) 1 GM/10ML suspension Take 10 mLs (1 g total) by mouth 4 (four) times daily -  with meals and at bedtime. 09/25/11 10/25/11 Yes Tora Kindred York, PA    Allergies:   Allergies  Allergen Reactions  . Lisinopril Swelling and Rash  . Morphine And Related Swelling and Rash    Social History:  Ambulatory  independently Lives at  home   reports that he has been smoking Cigarettes.  He has a 7.5 pack-year smoking history. He has never used smokeless tobacco. He reports  that he does not drink alcohol or use illicit drugs.   Family History: family history includes Diabetes in his father.    Physical Exam: Patient Vitals for the past 24 hrs:  BP Temp Temp src Pulse Resp SpO2  10/19/11 0200 124/59 mmHg - - 69  10  100 %  10/19/11 0115 131/65 mmHg - - 65  20  95 %  10/19/11 0030 130/80 mmHg - - 68  28  99 %  10/18/11 2335 135/78 mmHg 98.7 F (37.1 C) Oral 67  22  99 %  10/18/11 2118 135/77 mmHg 99.3 F (37.4 C) Oral 83  20  100 %    1. General:  in No Acute distress 2. Psychological: Alert and  Oriented 3. Head/ENT:   MoistMucous Membranes                          Head Non traumatic, neck supple  Poor Dentition 4. SKIN:  decreased Skin turgor,  Skin clean Dry and intact no rash 5. Heart: Regular rate and rhythm no Murmur, Rub or gallop 6. Lungs: Clear to auscultation bilaterally, no wheezes or crackles   7. Abdomen: Soft, epigastric tenderness, Non distended 8. Lower extremities: no clubbing, cyanosis, or edema 9. Neurologically Grossly intact, moving all 4 extremities equally 10. MSK: Normal range of motion  body mass index is unknown because there is no height or weight on file.   Labs on Admission:   [facility] 10/19/11 0030 10/17/11 0409  NA 138 133*  K 3.6 3.1*  CL 105 96  CO2 22 25  GLUCOSE 244* 339*  BUN 10 17  CREATININE 0.60 0.89  CALCIUM 9.1 9.0  MG -- --  PHOS -- --    Basename 10/19/11 0030 10/17/11 0409  AST 9 14  ALT 10 13  ALKPHOS 64 65  BILITOT 0.4 0.6  PROT 6.8 7.1  ALBUMIN 3.5 3.5    Basename 10/19/11 0030 10/17/11 0409  LIPASE 48 58  AMYLASE -- --    Basename 10/19/11 0030 10/17/11 0409  WBC 4.6 6.3  NEUTROABS 1.8 2.8  HGB 14.6 14.9  HCT 44.0 43.7  MCV 93.0 92.2  PLT 201 209   No results found for this basename: CKTOTAL:3,CKMB:3,CKMBINDEX:3,TROPONINI:3 in the last 72 hours No results found for this basename: TSH,T4TOTAL,FREET3,T3FREE,THYROIDAB in the last 72 hours No  results found for this basename: VITAMINB12:2,FOLATE:2,FERRITIN:2,TIBC:2,IRON:2,RETICCTPCT:2 in the last 72 hours Lab Results  Component Value Date   HGBA1C 11.6* 07/17/2011    The CrCl is unknown because both a height and weight (above a minimum accepted value) are required for this calculation. ABG    Component Value Date/Time   PHART 7.390 11/13/2010 1305   HCO3 28.9* 11/13/2010 1305   TCO2 26 07/11/2011 2030   ACIDBASEDEF 0.0 01/01/2009 1312   O2SAT 83.1 11/13/2010 1305     No results found for this basename: DDIMER     Other results:  I have pearsonaly reviewed this: ECG REPORT  Rate: 67  Rhythm: Sinus rhythm ST&T Change: No ischemic changes but there is T wave flattening in lead III   Cultures:    Component Value Date/Time   SDES URINE, RANDOM 11/14/2010 1508   SPECREQUEST NONE 11/14/2010 1508   CULT NO GROWTH 5 DAYS 11/14/2010 0025   REPTSTATUS 11/15/2010 FINAL 11/14/2010 1508       Radiological Exams on Admission: Ct Abdomen Pelvis W Contrast  10/19/2011  *RADIOLOGY REPORT*  Clinical Data: Diffuse abdominal pain, nausea, vomiting and diarrhea.  CT ABDOMEN AND PELVIS WITH CONTRAST  Technique:  Multidetector CT imaging of the abdomen and pelvis was performed following the standard protocol during bolus administration of intravenous contrast.  Contrast: OMNIPAQUE IOHEXOL 300 MG/ML  SOLN  Comparison: CT of the abdomen and pelvis performed 10/11/2011  Findings: Minimal bibasilar atelectasis is noted.  Prominence of the intrahepatic biliary ducts likely reflects prior cholecystectomy, with clips noted at the gallbladder fossa.  The liver is otherwise unremarkable in appearance.  The spleen is within normal limits.  The pancreas is grossly unremarkable.  A 2.2 cm right adrenal adenoma is again noted.  The left adrenal gland is unremarkable in appearance.  A vague 2.5 cm hypodense lesion within the medial left kidney remains nondescript, first characterized around 2011.  It  remains relatively stable in size, but malignancy cannot be entirely excluded.  A smaller 1.0 cm hypodensity is noted at the interpole region of the  left kidney.  MRI could be considered for further evaluation, as previously recommended.  No free fluid is identified.  The small bowel is unremarkable in appearance.  The stomach is within normal limits.  No acute vascular abnormalities are seen.  Scattered calcification is noted along the distal abdominal aorta and its branches.  The appendix is normal in caliber and contains air, without evidence for appendicitis.  The colon is unremarkable in appearance.  The bladder is mildly distended and grossly unremarkable in appearance.  The prostate remains borderline normal in size, with scattered calcification.  No inguinal lymphadenopathy is seen.  No acute osseous abnormalities are identified.  The patient is status post posterior lumbar spinal fusion at L4-L5, with associated decompression.  IMPRESSION:  1.  No acute abnormalities seen within the abdomen or pelvis. 2.  2.5 cm left renal lesion is nonspecific, first characterized around 2011.  It remains relatively stable in size, but malignancy cannot be excluded.  Smaller 1.0 cm renal hypodensity also seen. MRI with and without contrast would be helpful for further evaluation, when and as deemed clinically appropriate. 3.  2.2 cm right adrenal adenoma seen. 4.  Scattered calcification along the distal abdominal aorta and its branches.  Original Report Authenticated By: Tonia Ghent, M.D.    Assessment/Plan  61 year old gentleman with history of recurrent abdominal pain nausea and vomiting with history of gastric ulcerations and narcotic dependence  Present on Admission:  .Abdominal pain - this is chronic and multifactorial. Would attempt to avoid escalating his pain medications he ready received dilaudid in emergency department, patient states that he is in severe pain currently will give one dose and then  continue on by mouth medications  .DIABETES MELLITUS, TYPE II - not sure patient is compliant at home to continue sliding scale  .HYPERTENSION - continue home medications  .CORONARY ARTERY DISEASE - stable chest pain-free  .Hyperglycemia - there was some question about patient's compliance in the past for now will continue his home medications  .Narcotic dependence - patient would benefit from referral to pain clinic   .Gastric ulcer - no write for Protonix twice a day and Carafate   Prophylaxis: SCD Protonix  CODE STATUS: Full code  Other plan as per orders.  I have spent a total of 55 min on this admission  Ryan [name] 10/19/2011, 4:38 AM

## 2011-10-19 NOTE — ED Notes (Signed)
Pt request that his iv be started by iv team and that his blood be drawn at the same time

## 2011-10-20 DIAGNOSIS — R112 Nausea with vomiting, unspecified: Secondary | ICD-10-CM

## 2011-10-20 DIAGNOSIS — E1165 Type 2 diabetes mellitus with hyperglycemia: Secondary | ICD-10-CM

## 2011-10-20 DIAGNOSIS — R1013 Epigastric pain: Secondary | ICD-10-CM

## 2011-10-20 DIAGNOSIS — E118 Type 2 diabetes mellitus with unspecified complications: Secondary | ICD-10-CM

## 2011-10-20 DIAGNOSIS — K279 Peptic ulcer, site unspecified, unspecified as acute or chronic, without hemorrhage or perforation: Secondary | ICD-10-CM

## 2011-10-20 LAB — BASIC METABOLIC PANEL
BUN: 10 mg/dL (ref 6–23)
CO2: 23 mEq/L (ref 19–32)
Chloride: 103 mEq/L (ref 96–112)
GFR calc Af Amer: >90 mL/min (ref >90)
Potassium: 4 mEq/L (ref 3.5–5.1)

## 2011-10-20 LAB — GLUCOSE, CAPILLARY: Glucose-Capillary: 247 mg/dL — ABNORMAL HIGH (ref 70–99)

## 2011-10-20 LAB — CBC
HCT: 42.1 % (ref 39.0–52.0)
Platelets: 177 10*3/uL (ref 150–400)
RBC: 4.51 MIL/uL (ref 4.22–5.81)
RDW: 13.5 % (ref 11.5–15.5)
WBC: 4.2 10*3/uL (ref 4.0–10.5)

## 2011-10-20 LAB — TSH: TSH: 2.441 u[IU]/mL (ref 0.350–4.500)

## 2011-10-20 MED ORDER — GLIPIZIDE ER 10 MG PO TB24: 10.0000 mg | ORAL_TABLET | Freq: Two times a day (BID) | ORAL | Status: DC

## 2011-10-20 MED ORDER — INSULIN ASPART 100 UNIT/ML ~~LOC~~ SOLN
0.0000 [IU] | Freq: Three times a day (TID) | SUBCUTANEOUS | Status: DC
  Administered 2011-10-20: 5 [IU] via SUBCUTANEOUS
  Administered 2011-10-20 – 2011-10-21 (×2): 3 [IU] via SUBCUTANEOUS
  Administered 2011-10-21: 8 [IU] via SUBCUTANEOUS
  Administered 2011-10-22: 2 [IU] via SUBCUTANEOUS
  Administered 2011-10-22: 5 [IU] via SUBCUTANEOUS

## 2011-10-20 MED ORDER — INSULIN GLARGINE 100 UNIT/ML ~~LOC~~ SOLN
15.0000 [IU] | Freq: Every day | SUBCUTANEOUS | Status: DC
  Administered 2011-10-20 – 2011-10-21 (×2): 15 [IU] via SUBCUTANEOUS

## 2011-10-20 NOTE — Progress Notes (Addendum)
PCP: Warrick Parisian, MD  Brief HPI:  Ryan [name] is a 61 y.o. 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 Presented with 1 wk of abdominal pain nausea and vomiting. With recurrent trips to ER. Patient has had a history of this in the past. He does have narcotic dependence and has frequent trips to emergency department requesting Dilaudid. He presented because of severe epigastric pain nausea and vomiting. He does carry diagnosis of chronic pancreatitis. His lipase was within normal limits. He denied any alcohol use. CT scan showed no pancreatic abnormalities. It did show 2.5 left renal lesions which at some point needs to be followed up by MRI. Did not mention heavy stool burden as he had in the past. Patient does have history of gastric ulcerations which may partially explain his abdominal pain. No fever or chills, he had some diarrhea. He states he vomits his food almost immediately.   Past medical history:  Past Medical History  Diagnosis Date  . Hypertension   . Diabetes mellitus   . DVT (deep venous thrombosis)   . Chronic back pain   . 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 Hyperlipidemia   . CAD (coronary atherosclerotic disease)     s/p stents x2  . Small bowel obstruction     Consultants: None so far  Procedures: None  Subjective: Patient states he vomited last night. Not witnessed by nursing staff. Had a BM last night too. Per RN he has been eating without difficulty. Continue to complain of 8-9/10 pain in abdomen.   Objective: Vital signs in last 24 hours: Temp:  [97.9 F (36.6 C)-98.6 F (37 C)] 97.9 F (36.6 C) (06/23 0546) Pulse Rate:  [65-69] 65  (06/23 0546) Resp:  [18-20] 18  (06/23 0546) BP: (117-123)/(61-71) 118/71 mmHg (06/23 0546) SpO2:  [94  %-100 %] 100 % (06/23 0546) Weight:  [131.5 kg (289 lb 14.5 oz)] 131.5 kg (289 lb 14.5 oz) (06/23 0546) Weight change: -0.043 kg (-1.5 oz)    Intake/Output from previous day: 06/22 0701 - 06/23 0700 In: 2728 [P.O.:1560; I.V.:1168] Out: 1901 [Urine:1900; Stool:1] Intake/Output this shift:    General appearance: alert, cooperative, appears stated age, no distress and morbidly obese Head: Normocephalic, without obvious abnormality, atraumatic Resp: clear to auscultation bilaterally Cardio: regular rate and rhythm, S1, S2 normal, no murmur, click, rub or gallop GI: soft, mildly tender in upper abdomen; no rebound or rigidity bowel sounds normal; no masses,  no organomegaly Extremities: extremities normal, atraumatic, no cyanosis or edema Neurologic: Grossly normal  Lab Results:  Basename 10/20/11 0543 10/19/11 0030  WBC 4.2 4.6  HGB 13.7 14.6  HCT 42.1 44.0  PLT 177 201   BMET  Basename 10/20/11 0543 10/19/11 0030  NA 135 138  K 4.0 3.6  CL 103 105  CO2 23 22  GLUCOSE 210* 244*  BUN 10 10  CREATININE 0.72 0.60  CALCIUM 9.1 9.1  ALT -- 10    Studies/Results: Ct Abdomen Pelvis W Contrast  10/19/2011  *RADIOLOGY REPORT*  Clinical Data: Diffuse abdominal pain, nausea, vomiting and diarrhea.  CT ABDOMEN AND PELVIS WITH CONTRAST  Technique:  Multidetector CT imaging of the abdomen and pelvis was performed following the standard protocol during bolus administration of intravenous contrast.  Contrast: OMNIPAQUE IOHEXOL 300 MG/ML  SOLN  Comparison: CT of  the abdomen and pelvis performed 10/11/2011  Findings: Minimal bibasilar atelectasis is noted.  Prominence of the intrahepatic biliary ducts likely reflects prior cholecystectomy, with clips noted at the gallbladder fossa.  The liver is otherwise unremarkable in appearance.  The spleen is within normal limits.  The pancreas is grossly unremarkable.  A 2.2 cm right adrenal adenoma is again noted.  The left adrenal gland is  unremarkable in appearance.  A vague 2.5 cm hypodense lesion within the medial left kidney remains nondescript, first characterized around 2011.  It remains relatively stable in size, but malignancy cannot be entirely excluded.  A smaller 1.0 cm hypodensity is noted at the interpole region of the left kidney.  MRI could be considered for further evaluation, as previously recommended.  No free fluid is identified.  The small bowel is unremarkable in appearance.  The stomach is within normal limits.  No acute vascular abnormalities are seen.  Scattered calcification is noted along the distal abdominal aorta and its branches.  The appendix is normal in caliber and contains air, without evidence for appendicitis.  The colon is unremarkable in appearance.  The bladder is mildly distended and grossly unremarkable in appearance.  The prostate remains borderline normal in size, with scattered calcification.  No inguinal lymphadenopathy is seen.  No acute osseous abnormalities are identified.  The patient is status post posterior lumbar spinal fusion at L4-L5, with associated decompression.  IMPRESSION:  1.  No acute abnormalities seen within the abdomen or pelvis. 2.  2.5 cm left renal lesion is nonspecific, first characterized around 2011.  It remains relatively stable in size, but malignancy cannot be excluded.  Smaller 1.0 cm renal hypodensity also seen. MRI with and without contrast would be helpful for further evaluation, when and as deemed clinically appropriate. 3.  2.2 cm right adrenal adenoma seen. 4.  Scattered calcification along the distal abdominal aorta and its branches.  Original Report Authenticated By: Tonia Ghent, M.D.    Medications:  Scheduled:    . sodium chloride   Intravenous STAT  . ALPRAZolam  1 mg Oral BID  . docusate sodium  100 mg Oral BID  . gabapentin  400 mg Oral TID  . insulin aspart  0-15 Units Subcutaneous TID WC  . insulin glargine  10 Units Subcutaneous QHS  . losartan  100  mg Oral Daily  . pantoprazole (PROTONIX) IV  40 mg Intravenous Q24H  . sucralfate  1 g Oral TID WC & HS  . DISCONTD: insulin aspart  0-15 Units Subcutaneous Q4H  . DISCONTD: pantoprazole  40 mg Oral BID AC   Continuous:    . sodium chloride 75 mL/hr at 10/19/11 1610   RUE:[id number], acetaminophen, albuterol, alum & mag hydroxide-simeth, guaiFENesin-dextromethorphan, HYDROmorphone (DILAUDID) injection, ondansetron (ZOFRAN) IV, ondansetron (ZOFRAN) IV, ondansetron, oxycodone, prochlorperazine, senna-docusate, traMADol, DISCONTD: HYDROcodone-acetaminophen  Assessment/Plan:  Active Problems:  DIABETES MELLITUS, TYPE II  HYPERTENSION  CORONARY ARTERY DISEASE  PANCREATITIS, HX OF  Abdominal pain  Gastric ulcer  Narcotic dependence  Nausea and vomiting    Chronic Abdominal pain Per patient this has been ongoing for 1 year. Etiology remains unclear though could be from Gastritis. Multiple CT scans have not shown any abnormalities. Patient was seen by GI last admission and he underwent EGD which showed Gastric ulcerations. Continue current management for now.  Nausea and Vomiting Gastric emptying study to be done in AM. Differential diagnosis includes Gastroparesis, Gastritis.   PUD Continue PPI IV for now. Will switch to oral when his nausea  is controlled. Needs repeat EGD in July.  DIABETES MELLITUS, TYPE II Not well controlled. Last HBA1C was 11.6 in March 2013. Continue lantus and SSI. Adjust lantus today.  HYPERTENSION Continue home medications   History of CORONARY ARTERY DISEASE Stable. Chest pain-free   Narcotic dependence Patient would benefit from referral to pain clinic. Would defer to PCP  Code Status Full Code  DVT Prophylaxis SCD's  Disposition Possible discharge tomorrow after GES is completed.   LOS: 2 days   [facility]  [facility] Pager [phone] 10/20/2011, 8:05 AM

## 2011-10-21 ENCOUNTER — Inpatient Hospital Stay (HOSPITAL_COMMUNITY): Payer: Medicare Other

## 2011-10-21 DIAGNOSIS — R112 Nausea with vomiting, unspecified: Secondary | ICD-10-CM

## 2011-10-21 DIAGNOSIS — R1013 Epigastric pain: Secondary | ICD-10-CM

## 2011-10-21 DIAGNOSIS — K279 Peptic ulcer, site unspecified, unspecified as acute or chronic, without hemorrhage or perforation: Secondary | ICD-10-CM

## 2011-10-21 DIAGNOSIS — E118 Type 2 diabetes mellitus with unspecified complications: Secondary | ICD-10-CM

## 2011-10-21 DIAGNOSIS — E1165 Type 2 diabetes mellitus with hyperglycemia: Secondary | ICD-10-CM

## 2011-10-21 DIAGNOSIS — IMO0002 Reserved for concepts with insufficient information to code with codable children: Secondary | ICD-10-CM

## 2011-10-21 LAB — GLUCOSE, CAPILLARY
Glucose-Capillary: 121 mg/dL — ABNORMAL HIGH (ref 70–99)
Glucose-Capillary: 162 mg/dL — ABNORMAL HIGH (ref 70–99)
Glucose-Capillary: 294 mg/dL — ABNORMAL HIGH (ref 70–99)

## 2011-10-21 MED ORDER — METOCLOPRAMIDE HCL 5 MG/ML IJ SOLN
10.0000 mg | Freq: Once | INTRAMUSCULAR | Status: AC
  Administered 2011-10-21: 10 mg via INTRAVENOUS
  Filled 2011-10-21: qty 2

## 2011-10-21 MED ORDER — METOCLOPRAMIDE HCL 10 MG PO TABS
10.0000 mg | ORAL_TABLET | Freq: Three times a day (TID) | ORAL | Status: DC
  Administered 2011-10-21 – 2011-10-22 (×4): 10 mg via ORAL
  Filled 2011-10-21 (×7): qty 1

## 2011-10-21 MED ORDER — TECHNETIUM TC 99M SULFUR COLLOID
2.0000 | Freq: Once | INTRAVENOUS | Status: AC | PRN
  Administered 2011-10-21: 2 via INTRAVENOUS

## 2011-10-21 NOTE — Progress Notes (Signed)
Inpatient Diabetes Program Recommendations  AACE/ADA: New Consensus Statement on Inpatient Glycemic Control (2009)  Target Ranges:  Prepandial:   less than 140 mg/dL      Peak postprandial:   less than 180 mg/dL (1-2 hours)      Critically ill patients:  140 - 180 mg/dL   Reason for Visit: Elevated HgbA1C - 11.8%   Results for LIONELL, MATUSZAK (MRN [id number]) as of 10/21/2011 14:37  Ref. Range 10/19/2011 08:56  Hemoglobin A1C Latest Range: <5.7 % 11.8 (H)  Results for COTY, LARSH (MRN [id number]) as of 10/21/2011 14:37  Ref. Range 10/20/2011 17:19 10/20/2011 21:16 10/21/2011 07:55 10/21/2011 12:20  Glucose-Capillary Latest Range: 70-99 mg/dL 782 (H) 956 (H) 213 (H) 162 (H)    Note: Pt may benefit from OP Diabetes Education consult for uncontrolled DM. Blood sugars controlled today. Pt states he sees his PCP every 3 months and she was going to add rapid-acting insulin to his meds.  Discussed differences between insulins.  Will continue to follow.

## 2011-10-21 NOTE — Progress Notes (Addendum)
PCP: Warrick Parisian, MD  Brief HPI:  Ryan [name] is a 61 y.o. 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 Presented with 1 wk of abdominal pain nausea and vomiting. With recurrent trips to ER. Patient has had a history of this in the past. He does have narcotic dependence and has frequent trips to emergency department requesting Dilaudid. He presented because of severe epigastric pain nausea and vomiting. He does carry diagnosis of chronic pancreatitis. His lipase was within normal limits. He denied any alcohol use. CT scan showed no pancreatic abnormalities. It did show 2.5 left renal lesions which at some point needs to be followed up by MRI. Did not mention heavy stool burden as he had in the past. Patient does have history of gastric ulcerations which may partially explain his abdominal pain. No fever or chills, he had some diarrhea. He states he vomits his food almost immediately.   Past medical history:  Past Medical History  Diagnosis Date  . Hypertension   . Diabetes mellitus   . DVT (deep venous thrombosis)   . Chronic back pain   . 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 Hyperlipidemia   . CAD (coronary atherosclerotic disease)     s/p stents x2  . Small bowel obstruction     Consultants: None so far  Procedures: None  Subjective: Patient vomited this morning. Also with abdominal pain. He showed me the emesis. No blood. Appeared to have food material.   Objective: Vital signs in last 24 hours: Temp:  [97.5 F (36.4 C)-98.3 F (36.8 C)] 97.5 F (36.4 C) (06/24 0548) Pulse Rate:  [67-75] 67  (06/24 0548) Resp:  [18] 18  (06/24 0548) BP: (119-126)/(54-75) 119/54 mmHg (06/24 0548) SpO2:  [98 %-100 %] 98 % (06/24 0548) Weight change:  Last BM Date:  10/20/11  Intake/Output from previous day: 06/23 0701 - 06/24 0700 In: 1320 [P.O.:1320] Out: 3700 [Urine:3700] Intake/Output this shift: Total I/O In: -  Out: 450 [Urine:450]  General appearance: alert, cooperative, appears stated age, no distress and morbidly obese Head: Normocephalic, without obvious abnormality, atraumatic Resp: clear to auscultation bilaterally Cardio: regular rate and rhythm, S1, S2 normal, no murmur, click, rub or gallop GI: soft, mildly tender in upper abdomen; no rebound or rigidity bowel sounds normal; no masses,  no organomegaly Extremities: extremities normal, atraumatic, no cyanosis or edema Neurologic: Grossly normal  Lab Results:  Basename 10/20/11 0543 10/19/11 0030  WBC 4.2 4.6  HGB 13.7 14.6  HCT 42.1 44.0  PLT 177 201   BMET  Basename 10/20/11 0543 10/19/11 0030  NA 135 138  K 4.0 3.6  CL 103 105  CO2 23 22  GLUCOSE 210* 244*  BUN 10 10  CREATININE 0.72 0.60  CALCIUM 9.1 9.1  ALT -- 10    Studies/Results: No results found.  Medications:  Scheduled:    . ALPRAZolam  1 mg Oral BID  . docusate sodium  100 mg Oral BID  . gabapentin  400 mg Oral TID  . insulin aspart  0-15 Units Subcutaneous TID WC  . insulin glargine  15 Units Subcutaneous QHS  . losartan  100 mg Oral Daily  . pantoprazole (PROTONIX) IV  40 mg Intravenous Q24H  . sucralfate  1 g Oral TID WC & HS   Continuous:   ZOX:[id number], acetaminophen,  albuterol, alum & mag hydroxide-simeth, guaiFENesin-dextromethorphan, HYDROmorphone (DILAUDID) injection, ondansetron (ZOFRAN) IV, ondansetron, oxycodone, prochlorperazine, senna-docusate, technetium sulfur colloid, traMADol  Assessment/Plan:  Active Problems:  DIABETES MELLITUS, TYPE II  HYPERTENSION  CORONARY ARTERY DISEASE  PANCREATITIS, HX OF  Abdominal pain  Gastric ulcer  Narcotic dependence  Nausea and vomiting    Chronic Abdominal pain Per patient this has been ongoing for 1 year. Etiology  remains unclear though could be from Gastritis. Multiple CT scans have not shown any abnormalities. Patient was seen by GI last admission and he underwent EGD which showed Gastric ulcerations. Continue current management for now.  Nausea and Vomiting Patient continues to have vomiting. Gastric emptying study report pending. Differential diagnosis includes Gastroparesis, Gastritis. Can empirically start Reglan.   PUD Continue PPI IV for now. Will switch to oral when his nausea is controlled. Needs repeat EGD in July.  DIABETES MELLITUS, TYPE II Not well controlled. Last HBA1C was 11.6 in March 2013. Continue lantus and SSI. Adjusted lantus on 6/23. CBG slightly better.   HYPERTENSION Continue home medications   History of CORONARY ARTERY DISEASE Stable. Chest pain-free   Narcotic dependence Patient would benefit from referral to pain clinic. Would defer to PCP  Code Status Full Code  DVT Prophylaxis SCD's  Disposition Plan was for discharge today but patient actively vomiting. Await GES report.    LOS: 3 days   [facility]  [facility] Pager [phone] 10/21/2011, 1:31 PM

## 2011-10-22 DIAGNOSIS — K279 Peptic ulcer, site unspecified, unspecified as acute or chronic, without hemorrhage or perforation: Secondary | ICD-10-CM

## 2011-10-22 DIAGNOSIS — E1165 Type 2 diabetes mellitus with hyperglycemia: Secondary | ICD-10-CM

## 2011-10-22 DIAGNOSIS — R1013 Epigastric pain: Secondary | ICD-10-CM

## 2011-10-22 DIAGNOSIS — E118 Type 2 diabetes mellitus with unspecified complications: Secondary | ICD-10-CM

## 2011-10-22 DIAGNOSIS — R112 Nausea with vomiting, unspecified: Secondary | ICD-10-CM

## 2011-10-22 LAB — BASIC METABOLIC PANEL
Calcium: 9.3 mg/dL (ref 8.4–10.5)
Creatinine, Ser: 0.7 mg/dL (ref 0.50–1.35)
GFR calc Af Amer: >90 mL/min (ref >90)
GFR calc non Af Amer: >90 mL/min (ref >90)

## 2011-10-22 LAB — GLUCOSE, CAPILLARY
Glucose-Capillary: 238 mg/dL — ABNORMAL HIGH (ref 70–99)
Glucose-Capillary: 146 mg/dL — ABNORMAL HIGH (ref 70–99)

## 2011-10-22 MED ORDER — HYDROMORPHONE HCL PF 1 MG/ML IJ SOLN
1.0000 mg | Freq: Once | INTRAMUSCULAR | Status: AC
  Administered 2011-10-22: 1 mg via INTRAVENOUS

## 2011-10-22 MED ORDER — INSULIN GLARGINE 100 UNIT/ML ~~LOC~~ SOLN: 14.0000 [IU] | Freq: Every day | SUBCUTANEOUS | Status: DC

## 2011-10-22 MED ORDER — METOCLOPRAMIDE HCL 10 MG PO TABS: 5.0000 mg | ORAL_TABLET | Freq: Three times a day (TID) | ORAL | Status: DC

## 2011-10-22 NOTE — Discharge Summary (Signed)
Physician Discharge Summary  Patient ID: JHALEN ELEY MRN: [id number] DOB/AGE: 01/06/1951 61 y.o.  Admit date: 10/18/2011 Discharge date: 10/22/2011  PCP: Warrick Parisian, MD  DISCHARGE DIAGNOSES:  Active Problems:  DIABETES MELLITUS, TYPE II  HYPERTENSION  CORONARY ARTERY DISEASE  PANCREATITIS, HX OF  Abdominal pain  Gastric ulcer  Narcotic dependence  Nausea and vomiting   DISCHARGE CONDITION: fair  INITIAL HISTORY: Ryan [name] is a 61 y.o. 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 Presented with 1 wk of abdominal pain nausea and vomiting with recurrent trips to ER. Patient has had a history of this in the past. He does have narcotic dependence and has frequent trips to emergency department requesting Dilaudid. He presented because of severe epigastric pain nausea and vomiting. He does carry diagnosis of chronic pancreatitis. His lipase was within normal limits. He denied any alcohol use. CT scan showed no pancreatic abnormalities. It did show 2.5 left renal lesions which at some point needs to be followed up by MRI. Did not mention heavy stool burden as he had in the past. Patient does have history of gastric ulcerations which may partially explain his abdominal pain. No fever or chills, he had some diarrhea. He stated he vomits his food almost immediately.    HOSPITAL COURSE:   Chronic Abdominal pain  Per patient this has been ongoing for 1 year. Etiology remains unclear though could be from Gastritis. Multiple CT scans have not shown any abnormalities. Patient was seen by GI last admission and he underwent EGD which showed Gastric ulcerations. Patient was asked to  continue his current medications. He will be provided with the contact information for Dr. Marina Goodell, and he'll see him in  followup as outpatient.   Nausea and Vomiting  Patient mention, nausea and vomiting. He would have small episodes of emesis. This would be especially after he ate his meals. There was a high concern about gastroparesis and so, gastric emptying study was ordered. It was done yesterday and surprisingly shows normal emptying. However, we will put him on a trial of Reglan before his meals. If he doesn't show any improvement with this medication, it can be discontinued by his primary care physician.   PUD  He was found to have a gastric ulceration during previous hospitalization. He will need repeat endoscopy in July. This will be managed by his gastroenterologist.   DIABETES MELLITUS, TYPE II  Not very well controlled. Lantus dose was adjusted. He'll be asked to resume his home oral medications.   HYPERTENSION  Blood pressure remained stable. Continue home medications   History of CORONARY ARTERY DISEASE  He remained stable.   Narcotic dependence  Patient would benefit from referral to pain clinic. Would defer to PCP   So, overall, patient continues to have abdominal pain, which at this time, is chronic. He did have some nausea, vomiting, which I am hoping will improve with Reglan, even though there was no evidence for gastroparesis based on the gastric emptying study. However, I think the predominant issue is his narcotic dependence and his drug-seeking behavior. It remains unclear how significant his nausea and vomiting is. Patient is stable at this time. Did have a small amount of emesis yesterday, but none since then. He will be discharged home with followup with gastroenterology and with his primary care physician   IMAGING STUDIES Nm Gastric Emptying  10/21/2011  *RADIOLOGY REPORT*  Clinical Data:  Nausea and vomiting  NUCLEAR MEDICINE GASTRIC EMPTYING SCAN  Technique:  After oral ingestion of radiolabeled meal, sequential abdominal images were obtained for 120 minutes.  Residual  percentage of activity remaining within the stomach was calculated at 60 and 120 minutes.  Radiopharmaceutical:  2.0  mCi Tc-70m sulfur colloid.  Comparison:  10/19/2011  Findings: After 60 minutes there is 68% radiotracer activity remaining in the stomach.  After 120 minutes there is a 4% radiotracer activity remaining in the stomach.  IMPRESSION:  1.  Normal gastric emptying.  Original Report Authenticated By: Rosealee Albee, M.D.   Ct Abdomen Pelvis W Contrast  10/19/2011  *RADIOLOGY REPORT*  Clinical Data: Diffuse abdominal pain, nausea, vomiting and diarrhea.  CT ABDOMEN AND PELVIS WITH CONTRAST  Technique:  Multidetector CT imaging of the abdomen and pelvis was performed following the standard protocol during bolus administration of intravenous contrast.  Contrast: OMNIPAQUE IOHEXOL 300 MG/ML  SOLN  Comparison: CT of the abdomen and pelvis performed 10/11/2011  Findings: Minimal bibasilar atelectasis is noted.  Prominence of the intrahepatic biliary ducts likely reflects prior cholecystectomy, with clips noted at the gallbladder fossa.  The liver is otherwise unremarkable in appearance.  The spleen is within normal limits.  The pancreas is grossly unremarkable.  A 2.2 cm right adrenal adenoma is again noted.  The left adrenal gland is unremarkable in appearance.  A vague 2.5 cm hypodense lesion within the medial left kidney remains nondescript, first characterized around 2011.  It remains relatively stable in size, but malignancy cannot be entirely excluded.  A smaller 1.0 cm hypodensity is noted at the interpole region of the left kidney.  MRI could be considered for further evaluation, as previously recommended.  No free fluid is identified.  The small bowel is unremarkable in appearance.  The stomach is within normal limits.  No acute vascular abnormalities are seen.  Scattered calcification is noted along the distal abdominal aorta and its branches.  The appendix is normal in caliber and  contains air, without evidence for appendicitis.  The colon is unremarkable in appearance.  The bladder is mildly distended and grossly unremarkable in appearance.  The prostate remains borderline normal in size, with scattered calcification.  No inguinal lymphadenopathy is seen.  No acute osseous abnormalities are identified.  The patient is status post posterior lumbar spinal fusion at L4-L5, with associated decompression.  IMPRESSION:  1.  No acute abnormalities seen within the abdomen or pelvis. 2.  2.5 cm left renal lesion is nonspecific, first characterized around 2011.  It remains relatively stable in size, but malignancy cannot be excluded.  Smaller 1.0 cm renal hypodensity also seen. MRI with and without contrast would be helpful for further evaluation, when and as deemed clinically appropriate. 3.  2.2 cm right adrenal adenoma seen. 4.  Scattered calcification along the distal abdominal aorta and its branches.  Original Report Authenticated By: Tonia Ghent, M.D.    DISCHARGE EXAMINATION: Blood pressure 132/71, pulse 77, temperature 97.3 F (36.3 C), temperature source Oral, resp. rate 20, height 6\' 4"  (1.93 m), weight 131.5 kg (289 lb 14.5 oz), SpO2 95.00%. General appearance: alert, cooperative, appears stated age and morbidly obese Resp: clear to auscultation bilaterally Cardio: regular rate and rhythm, S1, S2 normal, no murmur, click, rub or gallop GI: soft, non-tender; bowel sounds normal; no masses,  no organomegaly Neurologic: Alert and oriented X 3, normal strength and tone. Normal symmetric reflexes. Normal coordination and gait  DISPOSITION: Home with family  Discharge Orders    Future Appointments: Provider: Department: Dept Phone: Center:   11/04/2011 10:15 AM Hilarie Fredrickson, MD [facility] [phone] [facility]     Future Orders Please Complete By Expires   Diet Carb Modified      Increase activity slowly        Current Discharge Medication List    START taking  these medications   Details  metoCLOPramide (REGLAN) 10 MG tablet Take 0.5 tablets (5 mg total) by mouth 4 (four) times daily -  before meals and at bedtime. Qty: 120 tablet, Refills: 1      CONTINUE these medications which have CHANGED   Details  insulin glargine (LANTUS) 100 UNIT/ML injection Inject 14 Units into the skin at bedtime. Qty: 10 mL, Refills: 1      CONTINUE these medications which have NOT CHANGED   Details  ALPRAZolam (XANAX) 1 MG tablet Take 1 tablet (1 mg total) by mouth 2 (two) times daily. Qty: 10 tablet, Refills: 0    aspirin EC 81 MG tablet Take 81 mg by mouth daily.    gabapentin (NEURONTIN) 400 MG capsule Take 1 capsule (400 mg total) by mouth 3 (three) times daily. Qty: 20 capsule, Refills: 0    glipiZIDE (GLUCOTROL XL) 10 MG 24 hr tablet Take 1 tablet (10 mg total) by mouth 2 (two) times daily. Qty: 11 tablet, Refills: 0    losartan (COZAAR) 100 MG tablet Take 1 tablet (100 mg total) by mouth daily. Qty: 30 tablet, Refills: 0    metFORMIN (GLUCOPHAGE) 850 MG tablet Take 850 mg by mouth 3 (three) times daily.    ondansetron (ZOFRAN) 4 MG tablet Take 1 tablet (4 mg total) by mouth every 6 (six) hours as needed. For nausea Qty: 20 tablet, Refills: 0    oxycodone (ROXICODONE) 30 MG immediate release tablet Take 1 tablet (30 mg total) by mouth every 4 (four) hours as needed. For pain Qty: 30 tablet, Refills: 0    pantoprazole (PROTONIX) 40 MG tablet Take 1 tablet (40 mg total) by mouth daily at 12 noon. Qty: 60 tablet, Refills: 0    prochlorperazine (COMPAZINE) 25 MG suppository Place 1 suppository (25 mg total) rectally every 12 (twelve) hours as needed for nausea. Qty: 12 suppository, Refills: 0    promethazine (PHENERGAN) 25 MG suppository Place 1 suppository (25 mg total) rectally every 6 (six) hours as needed for nausea. Qty: 12 each, Refills: 0    sennosides-docusate sodium (SENOKOT-S) 8.6-50 MG tablet Take 1 tablet by mouth 2 (two) times daily  as needed. For constipation    sucralfate (CARAFATE) 1 GM/10ML suspension Take 10 mLs (1 g total) by mouth 4 (four) times daily -  with meals and at bedtime. Qty: 420 mL, Refills: 2       Follow-up Information    Follow up with Warrick Parisian, MD. Schedule an appointment as soon as possible for a visit in 1 week. (post hospitalization follow up)    Contact information:   [address] [place] [address] [phone]       Schedule an appointment as soon as possible for a visit with Yancey Flemings, MD. (As needed for further problems with abdominal pain and nausea/vomiting)    Contact information:   [address] [facility] [address] [place] [address] [place] [address] [phone]          TOTAL DISCHARGE TIME: 35 mins  Dequarius Jeffries  [facility] Pager [phone]  10/22/2011, 9:31 AM

## 2011-10-22 NOTE — Progress Notes (Signed)
Patient discharged to home with instructions and verbalized understanding, no complaints. 

## 2011-10-23 ENCOUNTER — Emergency Department (HOSPITAL_COMMUNITY): Payer: Medicare Other

## 2011-10-23 ENCOUNTER — Emergency Department (HOSPITAL_COMMUNITY)
Admission: EM | Admit: 2011-10-23 | Discharge: 2011-10-23 | Disposition: A | Discharge status: Home / Self Care | Payer: Medicare Other | Attending: Emergency Medicine | Admitting: Emergency Medicine

## 2011-10-23 ENCOUNTER — Encounter (HOSPITAL_COMMUNITY): Payer: Self-pay | Admitting: *Deleted

## 2011-10-23 DIAGNOSIS — E119 Type 2 diabetes mellitus without complications: Secondary | ICD-10-CM | POA: Insufficient documentation

## 2011-10-23 DIAGNOSIS — I2510 Atherosclerotic heart disease of native coronary artery without angina pectoris: Secondary | ICD-10-CM | POA: Insufficient documentation

## 2011-10-23 DIAGNOSIS — K5900 Constipation, unspecified: Secondary | ICD-10-CM | POA: Insufficient documentation

## 2011-10-23 DIAGNOSIS — R1110 Vomiting, unspecified: Secondary | ICD-10-CM | POA: Insufficient documentation

## 2011-10-23 DIAGNOSIS — I10 Essential (primary) hypertension: Secondary | ICD-10-CM | POA: Insufficient documentation

## 2011-10-23 DIAGNOSIS — R10819 Abdominal tenderness, unspecified site: Secondary | ICD-10-CM | POA: Insufficient documentation

## 2011-10-23 DIAGNOSIS — R109 Unspecified abdominal pain: Secondary | ICD-10-CM | POA: Insufficient documentation

## 2011-10-23 HISTORY — DX: Acute pancreatitis without necrosis or infection, unspecified: K85.90

## 2011-10-23 LAB — CBC WITH DIFFERENTIAL/PLATELET
Basophils Absolute: 0 10*3/uL (ref 0.0–0.1)
Eosinophils Absolute: 0.1 10*3/uL (ref 0.0–0.7)
Eosinophils Relative: 2 % (ref 0–5)
Lymphocytes Relative: 30 % (ref 12–46)
MCH: 31 pg (ref 26.0–34.0)
MCV: 93.1 fL (ref 78.0–100.0)
Platelets: 189 10*3/uL (ref 150–400)
RDW: 13.4 % (ref 11.5–15.5)
WBC: 5.4 10*3/uL (ref 4.0–10.5)

## 2011-10-23 LAB — URINALYSIS, ROUTINE W REFLEX MICROSCOPIC
Hgb urine dipstick: NEGATIVE
Protein, ur: NEGATIVE mg/dL
Urobilinogen, UA: 0.2 mg/dL (ref 0.0–1.0)

## 2011-10-23 LAB — COMPREHENSIVE METABOLIC PANEL
BUN: 8 mg/dL (ref 6–23)
Calcium: 9.1 mg/dL (ref 8.4–10.5)
Creatinine, Ser: 0.6 mg/dL (ref 0.50–1.35)
GFR calc Af Amer: >90 mL/min (ref >90)
Glucose, Bld: 205 mg/dL — ABNORMAL HIGH (ref 70–99)
Total Protein: 6.7 g/dL (ref 6.0–8.3)

## 2011-10-23 LAB — LIPASE, BLOOD: Lipase: 35 U/L (ref 11–59)

## 2011-10-23 LAB — GLUCOSE, CAPILLARY: Glucose-Capillary: 231 mg/dL — ABNORMAL HIGH (ref 70–99)

## 2011-10-23 MED ORDER — ONDANSETRON HCL 4 MG/2ML IJ SOLN
4.0000 mg | Freq: Once | INTRAMUSCULAR | Status: AC
  Administered 2011-10-23: 4 mg via INTRAVENOUS
  Filled 2011-10-23: qty 2

## 2011-10-23 MED ORDER — HYDROMORPHONE HCL PF 1 MG/ML IJ SOLN
1.0000 mg | Freq: Once | INTRAMUSCULAR | Status: AC
  Administered 2011-10-23: 1 mg via INTRAVENOUS
  Filled 2011-10-23: qty 1

## 2011-10-23 MED ORDER — SODIUM CHLORIDE 0.9 % IV BOLUS (SEPSIS)
1000.0000 mL | Freq: Once | INTRAVENOUS | Status: AC
  Administered 2011-10-23: 1000 mL via INTRAVENOUS

## 2011-10-23 MED ORDER — HYDROMORPHONE HCL PF 1 MG/ML IJ SOLN
1.0000 mg | Freq: Once | INTRAMUSCULAR | Status: AC
  Administered 2011-10-23: 1 mg via INTRAMUSCULAR
  Filled 2011-10-23: qty 1

## 2011-10-23 MED ORDER — OXYCODONE-ACETAMINOPHEN 5-325 MG PO TABS: 1.0000 | ORAL_TABLET | ORAL | Status: DC | PRN

## 2011-10-23 MED ORDER — ONDANSETRON 8 MG PO TBDP: 8.0000 mg | ORAL_TABLET | Freq: Once | ORAL | Status: DC

## 2011-10-23 MED ORDER — SODIUM CHLORIDE 0.9 % IV SOLN
INTRAVENOUS | Status: DC
  Administered 2011-10-23: 15:00:00 via INTRAVENOUS

## 2011-10-23 MED ORDER — LACTULOSE 10 GM/15ML PO SOLN: 20.0000 g | Freq: Two times a day (BID) | ORAL | Status: DC

## 2011-10-23 NOTE — ED Notes (Signed)
Patient transported to CT 

## 2011-10-23 NOTE — Discharge Instructions (Signed)

## 2011-10-23 NOTE — ED Notes (Signed)
Pt states he started having lower abdominal pain a week ago. Pt states pain has increased and having n/v. Pt states his last bm was today no c/o black stools or diarrhea.

## 2011-10-23 NOTE — Progress Notes (Signed)
[facility] ED CM consulted by ED RN, for d/c planning services CM reviewed EPIC notes, labs and imaging.  CM spoke with SW Cm went to see pt but he is getting an enema.  CM spoke with RN who confirms pt ambulatory to bathroom.  Pt with hospitalizations in April and May 2013 During [facility] admission states has VA services and [facility] medicare coverage.  Pending pt assessment for further services

## 2011-10-23 NOTE — ED Provider Notes (Signed)
61yo M, hx chronic pain, here today with constipation.  Enema given with good effect, though pt is requesting "some more pain meds."  Will re-dose pain meds and d/c home with outpt f/u.  Laray Anger, DO 10/23/11 1646

## 2011-10-23 NOTE — Progress Notes (Signed)
ED CM assessed pt He confirms he has VA services and [place] health medicare coverage.  Pt states he "is tired of being in his home" States he wants to be at a facility where he can be provided with assistance.  CM reviewed differences and coverage needed for home health, private duty nursing, Adult day care services, assisted living and skilled nursing facilities.  Discussed how to get assistance from community pcp and veteran's Administration ([place]) Pt choice is to speak with VA staff for assistance.  Pt refused home health, private duty and adult day care services.  Pt voiced understanding and appreciation for services and resources offered Pt requested Cm have ED RN come to assist with his discharge

## 2011-10-23 NOTE — ED Notes (Signed)
Pt given 1L soap suds enema. Tolerated well. Pt had large solid BM. EDP notified.

## 2011-10-23 NOTE — ED Notes (Signed)
MD at bedside. 

## 2011-10-23 NOTE — ED Provider Notes (Cosign Needed)
History     CSN: [id number]  Arrival date & time 10/23/11  1131   First MD Initiated Contact with Patient 10/23/11 1243      Chief Complaint  Patient presents with  . Abdominal Pain  . Nausea  . Emesis    (Consider location/radiation/quality/duration/timing/severity/associated sxs/prior treatment) HPI Comments: Ryan [name] is a 61 y.o. Male with low abdominal pain for one week. He was seen for the same 5 days ago, had a CT of the abdomen was negative. Since that time. The pain has worsened and he has nausea and vomiting. He had two hard stools within the last 20 hours. He denies fever, chills, cough, shortness of breath, or chest pain. He's been unable to tolerate oral fluids today.  Patient is a 61 y.o. male presenting with abdominal pain and vomiting. The history is provided by the patient.  Abdominal Pain The primary symptoms of the illness include abdominal pain and vomiting.  Emesis  Associated symptoms include abdominal pain.    Past Medical History  Diagnosis Date  . Hypertension   . Diabetes mellitus   . DVT (deep venous thrombosis)   . Chronic back pain   . 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 Hyperlipidemia   . CAD (coronary atherosclerotic disease)     s/p stents x2  . Small bowel obstruction   . Pancreatitis     Past Surgical History  Procedure Date  . Heart stent   . Tonsillectomy and adenoidectomy   . Cholecystectomy   . Back surgery   . Esophagogastroduodenoscopy 09/22/2011    Procedure: ESOPHAGOGASTRODUODENOSCOPY (EGD);  Surgeon: Charna Elizabeth, MD;  Location: [facility] ENDOSCOPY;  Service: Endoscopy;  Laterality: N/A;    Family History  Problem Relation Age of Onset  . Diabetes Father     History  Substance Use Topics  . Smoking status: Current Some Day Smoker -- 0.2 packs/day for 30 years    Types: Cigarettes  . Smokeless tobacco: Never Used  . Alcohol Use: No      Review of Systems  Gastrointestinal: Positive for  vomiting and abdominal pain.  All other systems reviewed and are negative.    Allergies  Lisinopril and Morphine and related  Home Medications   Current Outpatient Rx  Name Route Sig Dispense Refill  . ALPRAZOLAM 1 MG PO TABS Oral Take 1 tablet (1 mg total) by mouth 2 (two) times daily. 10 tablet 0  . ASPIRIN EC 81 MG PO TBEC Oral Take 81 mg by mouth daily.    Marland Kitchen GABAPENTIN 400 MG PO CAPS Oral Take 1 capsule (400 mg total) by mouth 3 (three) times daily. 20 capsule 0  . GLIPIZIDE ER 10 MG PO TB24 Oral Take 1 tablet (10 mg total) by mouth 2 (two) times daily. 11 tablet 0  . INSULIN GLARGINE 100 UNIT/ML [place] SOLN Subcutaneous Inject 10 Units into the skin at bedtime.    Marland Kitchen LOSARTAN POTASSIUM 100 MG PO TABS Oral Take 1 tablet (100 mg total) by mouth daily. 30 tablet 0  . METFORMIN HCL 850 MG PO TABS Oral Take 850 mg by mouth 3 (three) times daily.    Marland Kitchen METOCLOPRAMIDE HCL 10 MG PO TABS Oral Take 0.5 tablets (5 mg total) by mouth 4 (four) times daily -  before meals and at bedtime. 120 tablet 1  . ONDANSETRON HCL 4 MG PO TABS Oral Take 1 tablet (4 mg total) by mouth every 6 (six) hours as needed.  For nausea 20 tablet 0  . OXYCODONE HCL 30 MG PO TABS Oral Take 1 tablet (30 mg total) by mouth every 4 (four) hours as needed. For pain 30 tablet 0  . PANTOPRAZOLE SODIUM 40 MG PO TBEC Oral Take 1 tablet (40 mg total) by mouth daily at 12 noon. 60 tablet 0  . PROCHLORPERAZINE 25 MG RE SUPP Rectal Place 1 suppository (25 mg total) rectally every 12 (twelve) hours as needed for nausea. 12 suppository 0  . PROMETHAZINE HCL 25 MG RE SUPP Rectal Place 1 suppository (25 mg total) rectally every 6 (six) hours as needed for nausea. 12 each 0  . SENNA-DOCUSATE SODIUM 8.6-50 MG PO TABS Oral Take 1 tablet by mouth 2 (two) times daily as needed. For constipation    . SUCRALFATE 1 GM/10ML PO SUSP Oral Take 10 mLs (1 g total) by mouth 4 (four) times daily -  with meals and at bedtime. 420 mL 2    BP 139/86  Pulse  118  Temp 98.2 F (36.8 C) (Oral)  Resp 20  Ht 6\' 4"  (1.93 m)  Wt 289 lb (131.09 kg)  BMI 35.18 kg/m2  SpO2 95%  Physical Exam  Nursing note and vitals reviewed. Constitutional: He is oriented to person, place, and time. He appears well-developed and well-nourished.  HENT:  Head: Normocephalic and atraumatic.  Right Ear: External ear normal.  Left Ear: External ear normal.  Eyes: Conjunctivae and EOM are normal. Pupils are equal, round, and reactive to light.  Neck: Normal range of motion and phonation normal. Neck supple.  Cardiovascular: Normal rate, regular rhythm, normal heart sounds and intact distal pulses.   Pulmonary/Chest: Effort normal and breath sounds normal. He exhibits no bony tenderness.  Abdominal: Soft. Normal appearance. He exhibits no distension and no mass. There is tenderness (Mild bilateral lower abdominal tenderness). There is no rebound and no guarding.  Genitourinary:       Rectal Exam: no impaction, small amt. of brown stool.  Musculoskeletal: Normal range of motion.  Neurological: He is alert and oriented to person, place, and time. He has normal strength. No cranial nerve deficit or sensory deficit. He exhibits normal muscle tone. Coordination normal.  Skin: Skin is warm, dry and intact.  Psychiatric: He has a normal mood and affect. His behavior is normal. Judgment and thought content normal.    ED Course  Procedures (including critical care time)  Reevaluation: 14:30 The patient now states that he has chronic constipation and has tried every stool softener, both prescription and over-the-counter and enemas without relief. He is unwilling to go home without an attempt to cleanse his bowels. Soap Suds enema was ordered.   Labs Reviewed  URINALYSIS, ROUTINE W REFLEX MICROSCOPIC - Abnormal; Notable for the following:    Glucose, UA 500 (*)     All other components within normal limits  GLUCOSE, CAPILLARY - Abnormal; Notable for the following:     Glucose-Capillary 231 (*)     All other components within normal limits  COMPREHENSIVE METABOLIC PANEL - Abnormal; Notable for the following:    Glucose, Bld 205 (*)     Albumin 3.4 (*)     Total Bilirubin 0.2 (*)     All other components within normal limits  CBC WITH DIFFERENTIAL  LIPASE, BLOOD  OCCULT BLOOD, POC DEVICE   Dg Abd Acute W/chest  10/23/2011  *RADIOLOGY REPORT*  Clinical Data: Hypertension and diabetes.  Abdominal pain, vomiting and diarrhea.  ACUTE ABDOMEN SERIES (ABDOMEN 2  VIEW & CHEST 1 VIEW)  Comparison: 10/17/2011 and multiple previous  Findings: No evidence of ileus, obstruction or free air.  There is large amount of fecal matter in the colon.  Previous spinal fusion procedure noted.  Previous cholecystectomy clips.  One-view chest shows chronic interstitial lung markings.  No sign of active infiltrate, mass, effusion or collapse.  No free air.  IMPRESSION: Large amount of fecal matter.  No other acute abdominal finding.  Chronic interstitial lung disease.  Original Report Authenticated By: Thomasenia Sales, M.D.     1. Abdominal pain   2. Constipation       MDM  Abdominal pain, secondary to constipation. Recent CT scan, and current labs today are reassuring. Doubt obstruction, colitis, occult infection, or metabolic instability.           Flint Melter, MD 10/23/11 Zollie Pee

## 2011-10-30 ENCOUNTER — Emergency Department (HOSPITAL_COMMUNITY)
Admission: EM | Admit: 2011-10-30 | Discharge: 2011-10-31 | Disposition: A | Discharge status: Home / Self Care | Payer: PRIVATE HEALTH INSURANCE | Attending: Emergency Medicine | Admitting: Emergency Medicine

## 2011-10-30 ENCOUNTER — Emergency Department (HOSPITAL_COMMUNITY): Payer: PRIVATE HEALTH INSURANCE

## 2011-10-30 ENCOUNTER — Encounter (HOSPITAL_COMMUNITY): Payer: Self-pay | Admitting: Emergency Medicine

## 2011-10-30 DIAGNOSIS — E119 Type 2 diabetes mellitus without complications: Secondary | ICD-10-CM | POA: Insufficient documentation

## 2011-10-30 DIAGNOSIS — Z794 Long term (current) use of insulin: Secondary | ICD-10-CM | POA: Insufficient documentation

## 2011-10-30 DIAGNOSIS — F17200 Nicotine dependence, unspecified, uncomplicated: Secondary | ICD-10-CM | POA: Insufficient documentation

## 2011-10-30 DIAGNOSIS — R112 Nausea with vomiting, unspecified: Secondary | ICD-10-CM | POA: Insufficient documentation

## 2011-10-30 DIAGNOSIS — Z86718 Personal history of other venous thrombosis and embolism: Secondary | ICD-10-CM | POA: Insufficient documentation

## 2011-10-30 DIAGNOSIS — Z7982 Long term (current) use of aspirin: Secondary | ICD-10-CM | POA: Insufficient documentation

## 2011-10-30 DIAGNOSIS — Z79899 Other long term (current) drug therapy: Secondary | ICD-10-CM | POA: Insufficient documentation

## 2011-10-30 DIAGNOSIS — F1120 Opioid dependence, uncomplicated: Secondary | ICD-10-CM

## 2011-10-30 DIAGNOSIS — I10 Essential (primary) hypertension: Secondary | ICD-10-CM | POA: Insufficient documentation

## 2011-10-30 DIAGNOSIS — I2510 Atherosclerotic heart disease of native coronary artery without angina pectoris: Secondary | ICD-10-CM | POA: Insufficient documentation

## 2011-10-30 DIAGNOSIS — F1920 Other psychoactive substance dependence, uncomplicated: Secondary | ICD-10-CM | POA: Insufficient documentation

## 2011-10-30 LAB — URINALYSIS, ROUTINE W REFLEX MICROSCOPIC
Ketones, ur: NEGATIVE mg/dL
Leukocytes, UA: NEGATIVE
Nitrite: NEGATIVE
Protein, ur: NEGATIVE mg/dL
Urobilinogen, UA: 0.2 mg/dL (ref 0.0–1.0)
pH: 6.5 (ref 5.0–8.0)

## 2011-10-30 LAB — CBC WITH DIFFERENTIAL/PLATELET
Basophils Absolute: 0 10*3/uL (ref 0.0–0.1)
Eosinophils Absolute: 0.1 10*3/uL (ref 0.0–0.7)
Lymphocytes Relative: 16 % (ref 12–46)
Lymphs Abs: 1.3 10*3/uL (ref 0.7–4.0)
MCH: 31 pg (ref 26.0–34.0)
Neutrophils Relative %: 75 % (ref 43–77)
Platelets: 197 10*3/uL (ref 150–400)
RBC: 4.1 MIL/uL — ABNORMAL LOW (ref 4.22–5.81)
RDW: 14 % (ref 11.5–15.5)
WBC: 8.3 10*3/uL (ref 4.0–10.5)

## 2011-10-30 LAB — COMPREHENSIVE METABOLIC PANEL
BUN: 12 mg/dL (ref 6–23)
CO2: 26 mEq/L (ref 19–32)
Chloride: 91 mEq/L — ABNORMAL LOW (ref 96–112)
Creatinine, Ser: 0.8 mg/dL (ref 0.50–1.35)
GFR calc non Af Amer: >90 mL/min (ref >90)
Total Bilirubin: 0.2 mg/dL — ABNORMAL LOW (ref 0.3–1.2)

## 2011-10-30 LAB — LIPASE, BLOOD: Lipase: 13 U/L (ref 11–59)

## 2011-10-30 LAB — URINE MICROSCOPIC-ADD ON

## 2011-10-30 MED ORDER — ONDANSETRON HCL 4 MG/2ML IJ SOLN: 4.0000 mg | Freq: Once | INTRAMUSCULAR | Status: DC

## 2011-10-30 MED ORDER — HYDROMORPHONE HCL PF 2 MG/ML IJ SOLN
2.0000 mg | Freq: Once | INTRAMUSCULAR | Status: AC
  Administered 2011-10-30: 2 mg via INTRAMUSCULAR
  Filled 2011-10-30: qty 1

## 2011-10-30 MED ORDER — HYDROMORPHONE HCL PF 1 MG/ML IJ SOLN: 1.0000 mg | Freq: Once | INTRAMUSCULAR | Status: DC

## 2011-10-30 MED ORDER — SODIUM CHLORIDE 0.9 % IV BOLUS (SEPSIS): 1000.0000 mL | Freq: Once | INTRAVENOUS | Status: DC

## 2011-10-30 MED ORDER — SODIUM CHLORIDE 0.9 % IV BOLUS (SEPSIS)
1000.0000 mL | Freq: Once | INTRAVENOUS | Status: AC
  Administered 2011-10-30: 1000 mL via INTRAVENOUS

## 2011-10-30 MED ORDER — ONDANSETRON HCL 4 MG/2ML IJ SOLN
4.0000 mg | INTRAMUSCULAR | Status: DC | PRN
  Administered 2011-10-30: 4 mg via INTRAVENOUS
  Filled 2011-10-30: qty 2

## 2011-10-30 NOTE — ED Notes (Signed)
Spoke with the provider Grant Fontana, [place]).  She agreed that it is reasonable to withhold the patient's Dilaudid at this time.

## 2011-10-30 NOTE — ED Notes (Signed)
IV team returned page, in route to patients room at this time.

## 2011-10-30 NOTE — ED Notes (Signed)
Pt c/o mid abd pain and vomiting x 3 days; pt sts hx of same and seen here for same

## 2011-10-30 NOTE — ED Notes (Signed)
The patient states that he is having 9-10/10 pain, however he was asleep moments before I asked him his pain level, and he fell asleep within 1 minute of my waking him to assess his pain.  The patient's respirations are shallow, and his O2 saturation level dropped into the 60s.  He is in no distress at this time, and his sats improved when I had him take deeper breaths.

## 2011-10-30 NOTE — ED Provider Notes (Signed)
History     CSN: [id number]  Arrival date & time 10/30/11  1710   First MD Initiated Contact with Patient 10/30/11 1954      Chief Complaint  Patient presents with  . Abdominal Pain  . Emesis    (Consider location/radiation/quality/duration/timing/severity/associated sxs/prior treatment) HPI Comments: Patient is a morbidly obese 61 year old [place] American male with a history of DVT, diabetes, CAD, pancreatitis, and SBO that presents to the emergency department with a chief complaint of abdominal pain and hyperemesis.  Patient states the current episode began with mid abdominal pain about 3-4 days ago.  Patient reports normal bowel movements today and yesterday.  Patient denies hematemesis, hematochezia, melena, fevers, night sweats, chills, and recent change in bowel movements, chest pain, shortness of breath, difficulty breathing, leg swelling, claudication.  Patient states that this pain feels similar to previous abdominal pain however it somehow worse.  Pain is described as knife stabbing and a feeling that "my stomach is being turned inside out".  Severity 10/10, no radiation.  Associated symptoms include weakness.  Note that patient has been evaluated on 6/26, 6/21, 6/20, 6/19, 6/14, 5/25, 5/22 for the same presenting illness.  Patient had a normal CT abdomen pelvis performed on June 22.   Patient is a 61 y.o. male presenting with abdominal pain and vomiting.  Abdominal Pain The primary symptoms of the illness include abdominal pain and vomiting.  Emesis  Associated symptoms include abdominal pain.    Past Medical History  Diagnosis Date  . Hypertension   . Diabetes mellitus   . DVT (deep venous thrombosis)   . Chronic back pain   . 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 Hyperlipidemia   . CAD (coronary atherosclerotic disease)     s/p stents x2  . Small bowel obstruction   . Pancreatitis     Past Surgical History  Procedure Date  . Heart stent    . Tonsillectomy and adenoidectomy   . Cholecystectomy   . Back surgery   . Esophagogastroduodenoscopy 09/22/2011    Procedure: ESOPHAGOGASTRODUODENOSCOPY (EGD);  Surgeon: Charna Elizabeth, MD;  Location: [facility] ENDOSCOPY;  Service: Endoscopy;  Laterality: N/A;    Family History  Problem Relation Age of Onset  . Diabetes Father     History  Substance Use Topics  . Smoking status: Current Some Day Smoker -- 0.2 packs/day for 30 years    Types: Cigarettes  . Smokeless tobacco: Never Used  . Alcohol Use: No      Review of Systems  Gastrointestinal: Positive for vomiting and abdominal pain.  All other systems reviewed and are negative.    Allergies  Lisinopril and Morphine and related  Home Medications   Current Outpatient Rx  Name Route Sig Dispense Refill  . ALPRAZOLAM 1 MG PO TABS Oral Take 1 mg by mouth 2 (two) times daily.    . ASPIRIN EC 81 MG PO TBEC Oral Take 81 mg by mouth daily.    Marland Kitchen GABAPENTIN 400 MG PO CAPS Oral Take 400 mg by mouth 3 (three) times daily.    Marland Kitchen GLIPIZIDE ER 10 MG PO TB24 Oral Take 10 mg by mouth 2 (two) times daily.    . INSULIN GLARGINE 100 UNIT/ML [place] SOLN Subcutaneous Inject 10 Units into the skin at bedtime.    Marland Kitchen LACTULOSE 10 GM/15ML PO SOLN Oral Take 20 g by mouth 2 (two) times daily.    Marland Kitchen LOSARTAN POTASSIUM 100 MG PO TABS Oral Take 100  mg by mouth daily.    Marland Kitchen METFORMIN HCL 850 MG PO TABS Oral Take 850 mg by mouth 3 (three) times daily.    Marland Kitchen METOCLOPRAMIDE HCL 10 MG PO TABS Oral Take 5 mg by mouth 4 (four) times daily. Before meals and at bedtime    . ONDANSETRON HCL 4 MG PO TABS Oral Take 4 mg by mouth every 6 (six) hours as needed. For nausea    . OXYCODONE HCL 30 MG PO TABS Oral Take 30 mg by mouth every 4 (four) hours as needed. For pain    . OXYCODONE-ACETAMINOPHEN 5-325 MG PO TABS Oral Take 1 tablet by mouth every 4 (four) hours as needed. For pain    . PANTOPRAZOLE SODIUM 40 MG PO TBEC Oral Take 40 mg by mouth daily.    . SENNA-DOCUSATE SODIUM  8.6-50 MG PO TABS Oral Take 1 tablet by mouth 2 (two) times daily as needed. For constipation      BP 150/74  Pulse 107  Temp 98.5 F (36.9 C) (Oral)  Resp 18  SpO2 99%  Physical Exam  Nursing note and vitals reviewed. Constitutional: Vital signs are normal. He appears well-developed and well-nourished.       Morbidly obese, nondiaphoretic, patient appears to be distressed do to abdominal pain.  HENT:  Head: Normocephalic and atraumatic.  Mouth/Throat: Uvula is midline, oropharynx is clear and moist and mucous membranes are normal.  Eyes: Conjunctivae and EOM are normal. Pupils are equal, round, and reactive to light.  Neck: Normal range of motion and full passive range of motion without pain. Neck supple. No spinous process tenderness and no muscular tenderness present. No rigidity. No Brudzinski's sign noted.  Cardiovascular: Regular rhythm.        tachcardic  Pulmonary/Chest: Effort normal and breath sounds normal. No accessory muscle usage. Not tachypneic.       No respiratory distress.  Abdominal: Soft. Normal appearance. He exhibits no distension, no ascites, no pulsatile midline mass and no mass. There is tenderness in the periumbilical area. There is no CVA tenderness. No hernia.    Lymphadenopathy:    He has no cervical adenopathy.  Neurological: He is alert.  Skin: Skin is warm and dry. No rash noted.  Psychiatric: He has a normal mood and affect. His speech is normal and behavior is normal.    ED Course  Procedures (including critical care time)  Labs Reviewed  COMPREHENSIVE METABOLIC PANEL - Abnormal; Notable for the following:    Sodium 132 (*)     Chloride 91 (*)     Glucose, Bld 397 (*)     Albumin 3.4 (*)     Total Bilirubin 0.2 (*)     All other components within normal limits  CBC WITH DIFFERENTIAL - Abnormal; Notable for the following:    RBC 4.10 (*)     Hemoglobin 12.7 (*)     HCT 38.5 (*)     All other components within normal limits  LIPASE,  BLOOD  URINALYSIS, ROUTINE W REFLEX MICROSCOPIC   Dg Abd Acute W/chest  10/30/2011  *RADIOLOGY REPORT*  Clinical Data: Abdominal pain, cough, smoker.  ACUTE ABDOMEN SERIES (ABDOMEN 2 VIEW & CHEST 1 VIEW)  Comparison: 10/23/2011  Findings: Peribronchial thickening and diffuse interstitial prominence again noted throughout the lungs compatible with chronic interstitial lung disease.  Heart is normal size.  No effusions.  Prior cholecystectomy.  Large stool burden throughout the colon. No evidence of bowel obstruction or free air.  No  organomegaly or suspicious calcification.  Prior posterior lower lumbar fusion.  IMPRESSION: Large stool burden.  No obstruction or free air.  Chronic interstitial lung disease.  Original Report Authenticated By: Cyndie Chime, M.D.     No diagnosis found.   Date: 10/30/2011  Rate: 90  Rhythm: normal sinus rhythm  QRS Axis: normal  Intervals: normal  ST/T Wave abnormalities: normal  Conduction Disutrbances:first-degree A-V block   Narrative Interpretation:   Old EKG Reviewed: unchanged    MDM  Chronic abdominal pain and hyperemesis.  Imaging and labs reviewed.  No evidence of obstruction or free air or acute abdominal series.  Patient has a history of diabetes, hypertension, DVT, pancreatitis, CAD and small bowel obstruction that presents emergency department multiple times for abdominal pain and emesis.  Plan is to move patient to the CDU for fluid bolus, CBG rechecked, and pain management.  Grant Fontana is familiar with plan and will reassess patient and decide if further imaging is indicated or if patient can be discharged home.  Note that CT abdomen pelvis was performed on June 22 without any acute abnormalities.  Patient appears reasonably stable for transfer to CDU.      Jaci Carrel, [place] 10/30/11 2237

## 2011-10-30 NOTE — ED Notes (Signed)
Pt asked for urine x 2 but unable to go due to emesis. Will try later

## 2011-10-30 NOTE — ED Notes (Signed)
IV team paged.  

## 2011-10-31 MED ORDER — PROMETHAZINE HCL 25 MG PO TABS
25.0000 mg | ORAL_TABLET | Freq: Four times a day (QID) | ORAL | Status: AC | PRN
Start: 2011-10-31 — End: 2011-11-07

## 2011-10-31 MED ORDER — KETOROLAC TROMETHAMINE 30 MG/ML IJ SOLN
30.0000 mg | Freq: Once | INTRAMUSCULAR | Status: AC
  Administered 2011-10-31: 30 mg via INTRAVENOUS
  Filled 2011-10-31: qty 1

## 2011-10-31 MED ORDER — PROMETHAZINE HCL 25 MG RE SUPP: 25.0000 mg | Freq: Four times a day (QID) | RECTAL | Status: AC | PRN

## 2011-10-31 NOTE — ED Provider Notes (Signed)
Care assumed of pt in CDU. Pt has been seen in ED multiple times in recent past for abd pain and hyperemesis. Labs sig for hyperglycemia without ketosis, otherwise negative. Pt has been medicated with Dilaudid. He is sleeping, but c/o 10/10 pain when awakened then immediately falls back asleep. He has been able to tolerate PO while in the dept. CBG is improved with fluids. Reasons to return to ED discussed.   Grant Fontana, PA-C 10/31/11 0111

## 2011-11-01 ENCOUNTER — Emergency Department (HOSPITAL_COMMUNITY): Payer: Medicare Other

## 2011-11-01 ENCOUNTER — Inpatient Hospital Stay (HOSPITAL_COMMUNITY): Payer: Medicare Other

## 2011-11-01 ENCOUNTER — Encounter (HOSPITAL_COMMUNITY): Payer: Self-pay | Admitting: Emergency Medicine

## 2011-11-01 ENCOUNTER — Inpatient Hospital Stay (HOSPITAL_COMMUNITY)
Admission: EM | Admit: 2011-11-01 | Discharge: 2011-11-04 | DRG: 392 | Disposition: A | Discharge status: Skilled Nursing Facility | Payer: Medicare Other | Source: Ambulatory Visit | Attending: Internal Medicine | Admitting: Internal Medicine

## 2011-11-01 DIAGNOSIS — Z79899 Other long term (current) drug therapy: Secondary | ICD-10-CM

## 2011-11-01 DIAGNOSIS — G894 Chronic pain syndrome: Secondary | ICD-10-CM | POA: Diagnosis present

## 2011-11-01 DIAGNOSIS — E119 Type 2 diabetes mellitus without complications: Secondary | ICD-10-CM | POA: Diagnosis present

## 2011-11-01 DIAGNOSIS — K56609 Unspecified intestinal obstruction, unspecified as to partial versus complete obstruction: Secondary | ICD-10-CM

## 2011-11-01 DIAGNOSIS — F1120 Opioid dependence, uncomplicated: Secondary | ICD-10-CM | POA: Diagnosis present

## 2011-11-01 DIAGNOSIS — K3189 Other diseases of stomach and duodenum: Secondary | ICD-10-CM

## 2011-11-01 DIAGNOSIS — M545 Low back pain, unspecified: Secondary | ICD-10-CM | POA: Diagnosis present

## 2011-11-01 DIAGNOSIS — R109 Unspecified abdominal pain: Secondary | ICD-10-CM | POA: Diagnosis present

## 2011-11-01 DIAGNOSIS — K5900 Constipation, unspecified: Secondary | ICD-10-CM | POA: Diagnosis present

## 2011-11-01 DIAGNOSIS — K5903 Drug induced constipation: Secondary | ICD-10-CM | POA: Diagnosis present

## 2011-11-01 DIAGNOSIS — Z8719 Personal history of other diseases of the digestive system: Secondary | ICD-10-CM

## 2011-11-01 DIAGNOSIS — Z86718 Personal history of other venous thrombosis and embolism: Secondary | ICD-10-CM

## 2011-11-01 DIAGNOSIS — T4275XA Adverse effect of unspecified antiepileptic and sedative-hypnotic drugs, initial encounter: Secondary | ICD-10-CM | POA: Diagnosis present

## 2011-11-01 DIAGNOSIS — K861 Other chronic pancreatitis: Secondary | ICD-10-CM | POA: Diagnosis present

## 2011-11-01 DIAGNOSIS — Z794 Long term (current) use of insulin: Secondary | ICD-10-CM

## 2011-11-01 DIAGNOSIS — Z7982 Long term (current) use of aspirin: Secondary | ICD-10-CM

## 2011-11-01 DIAGNOSIS — K259 Gastric ulcer, unspecified as acute or chronic, without hemorrhage or perforation: Secondary | ICD-10-CM

## 2011-11-01 DIAGNOSIS — K2970 Gastritis, unspecified, without bleeding: Principal | ICD-10-CM | POA: Diagnosis present

## 2011-11-01 DIAGNOSIS — I2510 Atherosclerotic heart disease of native coronary artery without angina pectoris: Secondary | ICD-10-CM | POA: Diagnosis present

## 2011-11-01 DIAGNOSIS — E876 Hypokalemia: Secondary | ICD-10-CM

## 2011-11-01 DIAGNOSIS — I10 Essential (primary) hypertension: Secondary | ICD-10-CM

## 2011-11-01 DIAGNOSIS — R112 Nausea with vomiting, unspecified: Secondary | ICD-10-CM

## 2011-11-01 DIAGNOSIS — Z9181 History of falling: Secondary | ICD-10-CM

## 2011-11-01 DIAGNOSIS — K219 Gastro-esophageal reflux disease without esophagitis: Secondary | ICD-10-CM | POA: Diagnosis present

## 2011-11-01 DIAGNOSIS — E785 Hyperlipidemia, unspecified: Secondary | ICD-10-CM | POA: Diagnosis present

## 2011-11-01 LAB — CBC
HCT: 39.7 % (ref 39.0–52.0)
Hemoglobin: 13 g/dL (ref 13.0–17.0)
MCHC: 32.7 g/dL (ref 30.0–36.0)
MCV: 94.7 fL (ref 78.0–100.0)
RDW: 14.2 % (ref 11.5–15.5)

## 2011-11-01 LAB — GLUCOSE, CAPILLARY
Glucose-Capillary: 295 mg/dL — ABNORMAL HIGH (ref 70–99)
Glucose-Capillary: 329 mg/dL — ABNORMAL HIGH (ref 70–99)
Glucose-Capillary: 235 mg/dL — ABNORMAL HIGH (ref 70–99)

## 2011-11-01 LAB — CBC WITH DIFFERENTIAL/PLATELET
Basophils Absolute: 0 10*3/uL (ref 0.0–0.1)
Basophils Relative: 1 % (ref 0–1)
Eosinophils Absolute: 0.1 10*3/uL (ref 0.0–0.7)
MCH: 31.2 pg (ref 26.0–34.0)
MCHC: 32.8 g/dL (ref 30.0–36.0)
Monocytes Absolute: 0.5 10*3/uL (ref 0.1–1.0)
Neutro Abs: 2.5 10*3/uL (ref 1.7–7.7)
Neutrophils Relative %: 49 % (ref 43–77)
RDW: 14 % (ref 11.5–15.5)

## 2011-11-01 LAB — COMPREHENSIVE METABOLIC PANEL
AST: 10 U/L (ref 0–37)
Albumin: 3 g/dL — ABNORMAL LOW (ref 3.5–5.2)
Chloride: 101 mEq/L (ref 96–112)
Creatinine, Ser: 0.77 mg/dL (ref 0.50–1.35)
Potassium: 4.1 mEq/L (ref 3.5–5.1)
Total Bilirubin: 0.1 mg/dL — ABNORMAL LOW (ref 0.3–1.2)
Total Protein: 6.5 g/dL (ref 6.0–8.3)

## 2011-11-01 LAB — CREATININE, SERUM: GFR calc non Af Amer: >90 mL/min (ref >90)

## 2011-11-01 MED ORDER — SENNA-DOCUSATE SODIUM 8.6-50 MG PO TABS
1.0000 | ORAL_TABLET | Freq: Every day | ORAL | Status: DC
  Administered 2011-11-01: 1 via ORAL

## 2011-11-01 MED ORDER — SODIUM CHLORIDE 0.9 % IV SOLN: INTRAVENOUS | Status: DC

## 2011-11-01 MED ORDER — HYDROMORPHONE HCL PF 1 MG/ML IJ SOLN
1.0000 mg | INTRAMUSCULAR | Status: DC | PRN
  Administered 2011-11-01 – 2011-11-03 (×7): 1 mg via INTRAVENOUS
  Filled 2011-11-01 (×7): qty 1

## 2011-11-01 MED ORDER — SUCRALFATE 1 GM/10ML PO SUSP
1.0000 g | Freq: Three times a day (TID) | ORAL | Status: DC
  Administered 2011-11-01 – 2011-11-04 (×12): 1 g via ORAL
  Filled 2011-11-01 (×15): qty 10

## 2011-11-01 MED ORDER — ONDANSETRON HCL 4 MG/2ML IJ SOLN: 4.0000 mg | Freq: Four times a day (QID) | INTRAMUSCULAR | Status: DC | PRN

## 2011-11-01 MED ORDER — HYDROMORPHONE HCL PF 1 MG/ML IJ SOLN
2.0000 mg | Freq: Once | INTRAMUSCULAR | Status: AC
  Administered 2011-11-01: 2 mg via INTRAVENOUS
  Filled 2011-11-01: qty 2

## 2011-11-01 MED ORDER — DULOXETINE HCL 30 MG PO CPEP
30.0000 mg | ORAL_CAPSULE | Freq: Every day | ORAL | Status: DC
  Administered 2011-11-01 – 2011-11-04 (×4): 30 mg via ORAL
  Filled 2011-11-01 (×4): qty 1

## 2011-11-01 MED ORDER — PANTOPRAZOLE SODIUM 40 MG IV SOLR
40.0000 mg | INTRAVENOUS | Status: DC
  Administered 2011-11-01: 40 mg via INTRAVENOUS
  Filled 2011-11-01 (×2): qty 40

## 2011-11-01 MED ORDER — GABAPENTIN 400 MG PO CAPS
400.0000 mg | ORAL_CAPSULE | Freq: Three times a day (TID) | ORAL | Status: DC
  Administered 2011-11-01 – 2011-11-04 (×9): 400 mg via ORAL
  Filled 2011-11-01 (×11): qty 1

## 2011-11-01 MED ORDER — LACTULOSE 10 GM/15ML PO SOLN
20.0000 g | Freq: Two times a day (BID) | ORAL | Status: DC
  Administered 2011-11-01 – 2011-11-04 (×7): 20 g via ORAL
  Filled 2011-11-01 (×8): qty 30

## 2011-11-01 MED ORDER — ENOXAPARIN SODIUM 40 MG/0.4ML ~~LOC~~ SOLN
40.0000 mg | SUBCUTANEOUS | Status: DC
  Administered 2011-11-01 – 2011-11-03 (×3): 40 mg via SUBCUTANEOUS
  Filled 2011-11-01 (×4): qty 0.4

## 2011-11-01 MED ORDER — ACETAMINOPHEN 650 MG RE SUPP: 650.0000 mg | Freq: Four times a day (QID) | RECTAL | Status: DC | PRN

## 2011-11-01 MED ORDER — INSULIN ASPART 100 UNIT/ML ~~LOC~~ SOLN
0.0000 [IU] | Freq: Three times a day (TID) | SUBCUTANEOUS | Status: DC
  Administered 2011-11-01: 5 [IU] via SUBCUTANEOUS
  Administered 2011-11-02: 3 [IU] via SUBCUTANEOUS
  Administered 2011-11-02 (×2): 8 [IU] via SUBCUTANEOUS
  Administered 2011-11-03: 5 [IU] via SUBCUTANEOUS
  Administered 2011-11-03: 3 [IU] via SUBCUTANEOUS
  Administered 2011-11-03: 5 [IU] via SUBCUTANEOUS
  Administered 2011-11-04 (×2): 3 [IU] via SUBCUTANEOUS

## 2011-11-01 MED ORDER — ALPRAZOLAM 0.5 MG PO TABS
1.0000 mg | ORAL_TABLET | Freq: Two times a day (BID) | ORAL | Status: DC
  Administered 2011-11-01 – 2011-11-04 (×7): 1 mg via ORAL
  Filled 2011-11-01: qty 2
  Filled 2011-11-01: qty 1
  Filled 2011-11-01: qty 2
  Filled 2011-11-01: qty 1
  Filled 2011-11-01 (×3): qty 2

## 2011-11-01 MED ORDER — LORAZEPAM 2 MG/ML IJ SOLN
1.0000 mg | Freq: Once | INTRAMUSCULAR | Status: AC
  Administered 2011-11-01: 1 mg via INTRAVENOUS
  Filled 2011-11-01: qty 1

## 2011-11-01 MED ORDER — LOSARTAN POTASSIUM 50 MG PO TABS
100.0000 mg | ORAL_TABLET | Freq: Every day | ORAL | Status: DC
  Administered 2011-11-01 – 2011-11-04 (×4): 100 mg via ORAL
  Filled 2011-11-01 (×4): qty 2

## 2011-11-01 MED ORDER — BISACODYL 10 MG RE SUPP: 10.0000 mg | Freq: Every day | RECTAL | Status: DC | PRN

## 2011-11-01 MED ORDER — METOCLOPRAMIDE HCL 5 MG/ML IJ SOLN
5.0000 mg | Freq: Three times a day (TID) | INTRAMUSCULAR | Status: DC
  Administered 2011-11-01 – 2011-11-02 (×3): 5 mg via INTRAVENOUS
  Filled 2011-11-01 (×7): qty 1

## 2011-11-01 MED ORDER — ONDANSETRON HCL 4 MG PO TABS: 4.0000 mg | ORAL_TABLET | Freq: Four times a day (QID) | ORAL | Status: DC | PRN

## 2011-11-01 MED ORDER — ASPIRIN EC 81 MG PO TBEC
81.0000 mg | DELAYED_RELEASE_TABLET | Freq: Every day | ORAL | Status: DC
  Administered 2011-11-01 – 2011-11-04 (×4): 81 mg via ORAL
  Filled 2011-11-01 (×4): qty 1

## 2011-11-01 MED ORDER — OXYCODONE HCL 5 MG PO TABS
30.0000 mg | ORAL_TABLET | ORAL | Status: DC | PRN
  Administered 2011-11-01 – 2011-11-03 (×5): 30 mg via ORAL
  Filled 2011-11-01 (×5): qty 6

## 2011-11-01 MED ORDER — SENNOSIDES-DOCUSATE SODIUM 8.6-50 MG PO TABS
1.0000 | ORAL_TABLET | Freq: Every day | ORAL | Status: DC
  Administered 2011-11-02 – 2011-11-04 (×3): 1 via ORAL
  Filled 2011-11-01 (×4): qty 1

## 2011-11-01 MED ORDER — INSULIN GLARGINE 100 UNIT/ML ~~LOC~~ SOLN
10.0000 [IU] | Freq: Every day | SUBCUTANEOUS | Status: DC
  Administered 2011-11-01 – 2011-11-03 (×3): 10 [IU] via SUBCUTANEOUS

## 2011-11-01 MED ORDER — ACETAMINOPHEN 325 MG PO TABS: 650.0000 mg | ORAL_TABLET | Freq: Four times a day (QID) | ORAL | Status: DC | PRN

## 2011-11-01 MED ORDER — GLIPIZIDE ER 10 MG PO TB24
10.0000 mg | ORAL_TABLET | Freq: Two times a day (BID) | ORAL | Status: DC
  Administered 2011-11-01 – 2011-11-04 (×6): 10 mg via ORAL
  Filled 2011-11-01 (×8): qty 1

## 2011-11-01 MED ORDER — SODIUM CHLORIDE 0.9 % IV SOLN
INTRAVENOUS | Status: DC
  Administered 2011-11-01: 999 mL via INTRAVENOUS

## 2011-11-01 MED ORDER — METOCLOPRAMIDE HCL 5 MG/ML IJ SOLN
10.0000 mg | Freq: Once | INTRAMUSCULAR | Status: AC
  Administered 2011-11-01: 10 mg via INTRAVENOUS
  Filled 2011-11-01: qty 2

## 2011-11-01 MED ORDER — FLEET ENEMA 7-19 GM/118ML RE ENEM
1.0000 | ENEMA | Freq: Once | RECTAL | Status: AC | PRN
  Filled 2011-11-01: qty 1

## 2011-11-01 MED ORDER — ALBUTEROL SULFATE (5 MG/ML) 0.5% IN NEBU: 2.5000 mg | INHALATION_SOLUTION | RESPIRATORY_TRACT | Status: DC | PRN

## 2011-11-01 NOTE — ED Notes (Signed)
Pt complains of abd pain, and not being able to care for himself. Pt sts he is unable to take his daily medications. And he is nauseated and has been vomiting.

## 2011-11-01 NOTE — Progress Notes (Signed)
Utilization Review Completed.Ryan [name] T7/08/2011   

## 2011-11-01 NOTE — ED Notes (Signed)
Spoke to Dr Lavera Guise states [facility] will see pt as soon as he gets to the room and they will order medication, pt updated

## 2011-11-01 NOTE — Progress Notes (Signed)
Called by Dr. Freida Busman  Patient with chronic pancreatitis, PUD, gastric ulcerations coming in with multiple falls at home, nausea vomiting unable to take his meds, unsafe living environment.  Placed referral to [facility]

## 2011-11-01 NOTE — ED Notes (Signed)
Pt states I haven't been able to eat in 5 or 6 days, reports he has not been able to take his insulin. Pt is aware of NPO status at this time, aware admitting to see him on the floor. No questions or concerns at this time.

## 2011-11-01 NOTE — Progress Notes (Signed)
MD Ghirime notified that have arrived to floor.

## 2011-11-01 NOTE — ED Provider Notes (Signed)
History     CSN: [id number]  Arrival date & time 11/01/11  4098   First MD Initiated Contact with Patient 11/01/11 0701      Chief Complaint  Patient presents with  . Abdominal Pain    (Consider location/radiation/quality/duration/timing/severity/associated sxs/prior treatment) Patient is a 61 y.o. male presenting with abdominal pain. The history is provided by the patient.  Abdominal Pain The primary symptoms of the illness include abdominal pain.   patient here with chronic abdominal pain. Seen multiple times for same. Has had 5 abdominal CTs in the past 3 months all of which have been negative for any acute process. He also had a workup for gastroparesis which was negative. According to the records, there is some concern for drug-seeking behavior. Patient notes bilious emesis today with fever at home up to 103. Denies any urinary symptoms. Is unable to keep his medications down.  Past Medical History  Diagnosis Date  . Hypertension   . Diabetes mellitus   . DVT (deep venous thrombosis)   . Chronic back pain   . 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 Hyperlipidemia   . CAD (coronary atherosclerotic disease)     s/p stents x2  . Small bowel obstruction   . Pancreatitis     Past Surgical History  Procedure Date  . Heart stent   . Tonsillectomy and adenoidectomy   . Cholecystectomy   . Back surgery   . Esophagogastroduodenoscopy 09/22/2011    Procedure: ESOPHAGOGASTRODUODENOSCOPY (EGD);  Surgeon: Charna Elizabeth, MD;  Location: [facility] ENDOSCOPY;  Service: Endoscopy;  Laterality: N/A;    Family History  Problem Relation Age of Onset  . Diabetes Father     History  Substance Use Topics  . Smoking status: Current Some Day Smoker -- 0.2 packs/day for 30 years    Types: Cigarettes  . Smokeless tobacco: Never Used  . Alcohol Use: No      Review of Systems  Gastrointestinal: Positive for abdominal pain.  All other systems reviewed and are  negative.    Allergies  Lisinopril and Morphine and related  Home Medications   Current Outpatient Rx  Name Route Sig Dispense Refill  . ALPRAZOLAM 1 MG PO TABS Oral Take 1 mg by mouth 2 (two) times daily.    . ASPIRIN EC 81 MG PO TBEC Oral Take 81 mg by mouth daily.    Marland Kitchen GABAPENTIN 400 MG PO CAPS Oral Take 400 mg by mouth 3 (three) times daily.    Marland Kitchen GLIPIZIDE ER 10 MG PO TB24 Oral Take 10 mg by mouth 2 (two) times daily.    . INSULIN GLARGINE 100 UNIT/ML [place] SOLN Subcutaneous Inject 10 Units into the skin at bedtime.    Marland Kitchen LACTULOSE 10 GM/15ML PO SOLN Oral Take 20 g by mouth 2 (two) times daily.    Marland Kitchen LOSARTAN POTASSIUM 100 MG PO TABS Oral Take 100 mg by mouth daily.    Marland Kitchen METFORMIN HCL 850 MG PO TABS Oral Take 850 mg by mouth 3 (three) times daily.    Marland Kitchen METOCLOPRAMIDE HCL 10 MG PO TABS Oral Take 5 mg by mouth 4 (four) times daily. Before meals and at bedtime    . ONDANSETRON HCL 4 MG PO TABS Oral Take 4 mg by mouth every 6 (six) hours as needed. For nausea    . OXYCODONE HCL 30 MG PO TABS Oral Take 30 mg by mouth every 4 (four) hours as needed. For pain    .  OXYCODONE-ACETAMINOPHEN 5-325 MG PO TABS Oral Take 1 tablet by mouth every 4 (four) hours as needed. For pain    . PANTOPRAZOLE SODIUM 40 MG PO TBEC Oral Take 40 mg by mouth daily.    Marland Kitchen PROMETHAZINE HCL 25 MG RE SUPP Rectal Place 1 suppository (25 mg total) rectally every 6 (six) hours as needed for nausea. 12 each 0  . PROMETHAZINE HCL 25 MG PO TABS Oral Take 1 tablet (25 mg total) by mouth every 6 (six) hours as needed for nausea. 30 tablet 0  . SENNA-DOCUSATE SODIUM 8.6-50 MG PO TABS Oral Take 1 tablet by mouth 2 (two) times daily as needed. For constipation      BP 151/79  Pulse 98  Temp 98.3 F (36.8 C) (Oral)  Resp 20  SpO2 94%  Physical Exam  Nursing note and vitals reviewed. Constitutional: He is oriented to person, place, and time. He appears well-developed and well-nourished.  Non-toxic appearance. No distress.   HENT:  Head: Normocephalic and atraumatic.  Eyes: Conjunctivae, EOM and lids are normal. Pupils are equal, round, and reactive to light.  Neck: Normal range of motion. Neck supple. No tracheal deviation present. No mass present.  Cardiovascular: Normal rate, regular rhythm and normal heart sounds.  Exam reveals no gallop.   No murmur heard. Pulmonary/Chest: Effort normal and breath sounds normal. No stridor. No respiratory distress. He has no decreased breath sounds. He has no wheezes. He has no rhonchi. He has no rales.  Abdominal: Soft. Normal appearance and bowel sounds are normal. He exhibits no distension. There is generalized tenderness. There is no rebound and no CVA tenderness.  Musculoskeletal: Normal range of motion. He exhibits no edema and no tenderness.  Neurological: He is alert and oriented to person, place, and time. He has normal strength. No cranial nerve deficit or sensory deficit. GCS eye subscore is 4. GCS verbal subscore is 5. GCS motor subscore is 6.  Skin: Skin is warm and dry. No abrasion and no rash noted.  Psychiatric: He has a normal mood and affect. His speech is normal and behavior is normal.    ED Course  Procedures (including critical care time)  Labs Reviewed  GLUCOSE, CAPILLARY - Abnormal; Notable for the following:    Glucose-Capillary 329 (*)     All other components within normal limits  CBC WITH DIFFERENTIAL  COMPREHENSIVE METABOLIC PANEL  LIPASE, BLOOD  URINE CULTURE  URINALYSIS, ROUTINE W REFLEX MICROSCOPIC   Dg Abd Acute W/chest  10/30/2011  *RADIOLOGY REPORT*  Clinical Data: Abdominal pain, cough, smoker.  ACUTE ABDOMEN SERIES (ABDOMEN 2 VIEW & CHEST 1 VIEW)  Comparison: 10/23/2011  Findings: Peribronchial thickening and diffuse interstitial prominence again noted throughout the lungs compatible with chronic interstitial lung disease.  Heart is normal size.  No effusions.  Prior cholecystectomy.  Large stool burden throughout the colon. No  evidence of bowel obstruction or free air.  No organomegaly or suspicious calcification.  Prior posterior lower lumbar fusion.  IMPRESSION: Large stool burden.  No obstruction or free air.  Chronic interstitial lung disease.  Original Report Authenticated By: Cyndie Chime, M.D.     No diagnosis found.    MDM  Patient given IV fluids and pain medications here. Spoke with hospitalist and he'll be admitted for pain control        Toy Baker, MD 11/01/11 [phone]

## 2011-11-01 NOTE — ED Notes (Signed)
IV team notified and will come to start IV

## 2011-11-01 NOTE — ED Provider Notes (Signed)
History/physical exam/procedure(s) were performed by non-physician practitioner and as supervising physician I was immediately available for consultation/collaboration. I have reviewed all notes and am in agreement with care and plan.   Hilario Quarry, MD 11/01/11 [phone]

## 2011-11-01 NOTE — ED Notes (Signed)
IV team at bedside 

## 2011-11-01 NOTE — ED Notes (Addendum)
PT reports he is here for diabetic neuropathy in feet and belly pain that he was seen here for recently; says he lives alone and needs help and cannot go home like this anymore. Called PCP and she said come here bc she doesn't work today. He says prescriptions he got here have not been helpful. PT reports he is too sick to take home meds.

## 2011-11-01 NOTE — ED Notes (Signed)
cbg [phone] 

## 2011-11-01 NOTE — Progress Notes (Signed)
Brief Nutrition Note  Patient identified on the Nutrition Risk Report for unintentional weight loss.   Pt admitted with abdominal pain, N/V. Per pt he has been able to tolerate his normal diet despite pain and nausea. Current diet order is Full Liquid, patient is consuming approximately 100% of meals at this time. Labs and medications reviewed.  Pt consumed 100% of his full liquid diet at lunch and is requesting more food.  Spoke with pt's RN and MD. Diet to be advanced to DM/Heart Healthy for dinner.  Pt reports weight loss PTA, however unable to verify. Question accuracy of reported history.   Body mass index is 36.23 kg/(m^2). Pt meets criteria for Obesity Class II based on current BMI.   No further nutrition interventions warranted at this time. If additional nutrition issues arise, please re-consult RD.   Kendell Bane RD, LDN, CNSC [phone] Pager [phone] After Hours Pager

## 2011-11-01 NOTE — H&P (Signed)
PATIENT DETAILS Name: Ryan [name] Age: 61 y.o. Sex: male Date of Birth: Nov 12, 1950 Admit Date: 11/01/2011 BJY:NWGNFAOZH,YQMVHQ, MD   CHIEF COMPLAINT:  Abdominal pain with vomiting for the past 3-4 days  HPI: Patient is a 61 year old African American male with numerous recent admissions, with a past medical history of chronic abdominal pain on chronic narcotics, diabetes, masses facial reflux disease who comes back with the above-noted complaints. The patient he was at his baseline state of health till around 3 days ago when he started having worsening of his chronic abdominal pain. He claims that the pain is 10/10 in severity, with no radiation. Pain is not relieved by his usual narcotic medications at home. He claims that pain has been associated with numerous episodes of vomiting, he claims that he is unable to keep any of his medications down as well. He does claim to have subjective fever. There is no shortness of breath or chest pain. There is no diarrhea. No headache. He claims that for the past couple weeks he has gotten weak, and has fallen quite a lot. There is no history of loss of consciousness. Patient claims that he is now unable to care for himself. He is now being admitted to the hospital for further evaluation and treatment   ALLERGIES:   Allergies  Allergen Reactions  . Lisinopril Swelling and Rash  . Morphine And Related Swelling and Rash    PAST MEDICAL HISTORY: Past Medical History  Diagnosis Date  . Hypertension   . Diabetes mellitus   . DVT (deep venous thrombosis)   . Chronic back pain   . 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 Hyperlipidemia   . CAD (coronary atherosclerotic disease)     s/p stents x2  . Small bowel obstruction   . Pancreatitis     PAST SURGICAL HISTORY: Past Surgical History  Procedure Date  . Heart stent   . Tonsillectomy and adenoidectomy   . Cholecystectomy   . Back surgery   .  Esophagogastroduodenoscopy 09/22/2011    Procedure: ESOPHAGOGASTRODUODENOSCOPY (EGD);  Surgeon: Charna Elizabeth, MD;  Location: [facility] ENDOSCOPY;  Service: Endoscopy;  Laterality: N/A;    MEDICATIONS AT HOME: Prior to Admission medications   Medication Sig Start Date End Date Taking? Authorizing Provider  ALPRAZolam Prudy Feeler) 1 MG tablet Take 1 mg by mouth 2 (two) times daily.   Yes Historical Provider, MD  aspirin EC 81 MG tablet Take 81 mg by mouth daily.   Yes Historical Provider, MD  gabapentin (NEURONTIN) 400 MG capsule Take 400 mg by mouth 3 (three) times daily.   Yes Historical Provider, MD  glipiZIDE (GLUCOTROL XL) 10 MG 24 hr tablet Take 10 mg by mouth 2 (two) times daily.   Yes Historical Provider, MD  insulin glargine (LANTUS) 100 UNIT/ML injection Inject 10 Units into the skin at bedtime. 10/22/11 10/21/12 Yes Osvaldo Shipper, MD  lactulose (CHRONULAC) 10 GM/15ML solution Take 20 g by mouth 2 (two) times daily.   Yes Historical Provider, MD  losartan (COZAAR) 100 MG tablet Take 100 mg by mouth daily.   Yes Historical Provider, MD  metFORMIN (GLUCOPHAGE) 850 MG tablet Take 850 mg by mouth 3 (three) times daily.   Yes Historical Provider, MD  metoCLOPramide (REGLAN) 10 MG tablet Take 5 mg by mouth 4 (four) times daily. Before meals and at bedtime   Yes Historical Provider, MD  ondansetron (ZOFRAN) 4 MG tablet Take 4 mg by mouth every 6 (six) hours  as needed. For nausea   Yes Historical Provider, MD  oxycodone (ROXICODONE) 30 MG immediate release tablet Take 30 mg by mouth every 4 (four) hours as needed. For pain   Yes Historical Provider, MD  oxyCODONE-acetaminophen (PERCOCET) 5-325 MG per tablet Take 1 tablet by mouth every 4 (four) hours as needed. For pain   Yes Historical Provider, MD  pantoprazole (PROTONIX) 40 MG tablet Take 40 mg by mouth daily.   Yes Historical Provider, MD  promethazine (PHENERGAN) 25 MG suppository Place 1 suppository (25 mg total) rectally every 6 (six) hours as needed for  nausea. 10/31/11 11/07/11 Yes Grant Fontana, PA-C  promethazine (PHENERGAN) 25 MG tablet Take 1 tablet (25 mg total) by mouth every 6 (six) hours as needed for nausea. 10/31/11 11/07/11 Yes Grant Fontana, PA-C  sennosides-docusate sodium (SENOKOT-S) 8.6-50 MG tablet Take 1 tablet by mouth 2 (two) times daily as needed. For constipation   Yes Historical Provider, MD    FAMILY HISTORY: Family History  Problem Relation Age of Onset  . Diabetes Father     SOCIAL HISTORY:  reports that he has been smoking Cigarettes.  He has a 7.5 pack-year smoking history. He has never used smokeless tobacco. He reports that he does not drink alcohol or use illicit drugs.  REVIEW OF SYSTEMS:  Constitutional:   No  weight loss, night sweats,  Fevers, chills, fatigue.  HEENT:    No headaches, Difficulty swallowing,Tooth/dental problems,Sore throat,  No sneezing, itching, ear ache, nasal congestion, post nasal drip,   Cardio-vascular: No chest pain,  Orthopnea, PND, swelling in lower extremities, anasarca, dizziness, palpitations  GI:  No heartburn, indigestion,  diarrhea, change in  bowel habits, loss of appetite  Resp: No shortness of breath with exertion or at rest.  No excess mucus, no productive cough, No non-productive cough,  No coughing up of blood.No change in color of mucus.No wheezing.No chest wall deformity  Skin:  no rash or lesions.  GU:  no dysuria, change in color of urine, no urgency or frequency.  No flank pain.  Musculoskeletal: No joint pain or swelling.  No decreased range of motion.  No back pain.  Psych: No change in mood or affect. No depression or anxiety.  No memory loss.   PHYSICAL EXAM: Blood pressure 151/81, pulse 75, temperature 98.5 F (36.9 C), temperature source Oral, resp. rate 20, height 6\' 6"  (1.981 m), weight 142.2 kg (313 lb 7.9 oz), SpO2 98.00%.  General appearance :Awake, alert, not in any distress. Speech Clear. Not toxic Looking HEENT: Atraumatic  and Normocephalic, pupils equally reactive to light and accomodation Neck: supple, no JVD. No cervical lymphadenopathy.  Chest:Good air entry bilaterally, no added sounds  CVS: S1 S2 regular, no murmurs.  Abdomen: Bowel sounds present, mildly tender in the epigastric and periumbilical area with no gaurding, rigidity or rebound. Extremities: B/L Lower Ext shows no edema, both legs are warm to touch, with  dorsalis pedis pulses palpable. Neurology: Awake alert, and oriented X 3, CN II-XII intact, Non focal Skin:No Rash Wounds:N/A  LABS ON ADMISSION:   Basename 11/01/11 0750 10/30/11 1802  NA 140 132*  K 4.1 4.2  CL 101 91*  CO2 29 26  GLUCOSE 325* 397*  BUN 7 12  CREATININE 0.77 0.80  CALCIUM 9.2 9.6  MG -- --  PHOS -- --    Basename 11/01/11 0750 10/30/11 1802  AST 10 11  ALT 8 9  ALKPHOS 71 78  BILITOT 0.1* 0.2*  PROT 6.5 7.0  ALBUMIN 3.0* 3.4*    Basename 11/01/11 0750 10/30/11 1802  LIPASE 20 13  AMYLASE -- --    Basename 11/01/11 0750 10/30/11 1802  WBC 5.1 8.3  NEUTROABS 2.5 6.2  HGB 13.2 12.7*  HCT 40.2 38.5*  MCV 95.0 93.9  PLT 172 197   No results found for this basename: CKTOTAL:3,CKMB:3,CKMBINDEX:3,TROPONINI:3 in the last 72 hours No results found for this basename: DDIMER:2 in the last 72 hours No components found with this basename: POCBNP:3   RADIOLOGIC STUDIES ON ADMISSION: Nm Gastric Emptying  10/21/2011  *RADIOLOGY REPORT*  Clinical Data:  Nausea and vomiting  NUCLEAR MEDICINE GASTRIC EMPTYING SCAN  Technique:  After oral ingestion of radiolabeled meal, sequential abdominal images were obtained for 120 minutes.  Residual percentage of activity remaining within the stomach was calculated at 60 and 120 minutes.  Radiopharmaceutical:  2.0  mCi Tc-44m sulfur colloid.  Comparison:  10/19/2011  Findings: After 60 minutes there is 68% radiotracer activity remaining in the stomach.  After 120 minutes there is a 4% radiotracer activity remaining in the  stomach.  IMPRESSION:  1.  Normal gastric emptying.  Original Report Authenticated By: Rosealee Albee, M.D.   Ct Abdomen Pelvis W Contrast  10/19/2011  *RADIOLOGY REPORT*  Clinical Data: Diffuse abdominal pain, nausea, vomiting and diarrhea.  CT ABDOMEN AND PELVIS WITH CONTRAST  Technique:  Multidetector CT imaging of the abdomen and pelvis was performed following the standard protocol during bolus administration of intravenous contrast.  Contrast: OMNIPAQUE IOHEXOL 300 MG/ML  SOLN  Comparison: CT of the abdomen and pelvis performed 10/11/2011  Findings: Minimal bibasilar atelectasis is noted.  Prominence of the intrahepatic biliary ducts likely reflects prior cholecystectomy, with clips noted at the gallbladder fossa.  The liver is otherwise unremarkable in appearance.  The spleen is within normal limits.  The pancreas is grossly unremarkable.  A 2.2 cm right adrenal adenoma is again noted.  The left adrenal gland is unremarkable in appearance.  A vague 2.5 cm hypodense lesion within the medial left kidney remains nondescript, first characterized around 2011.  It remains relatively stable in size, but malignancy cannot be entirely excluded.  A smaller 1.0 cm hypodensity is noted at the interpole region of the left kidney.  MRI could be considered for further evaluation, as previously recommended.  No free fluid is identified.  The small bowel is unremarkable in appearance.  The stomach is within normal limits.  No acute vascular abnormalities are seen.  Scattered calcification is noted along the distal abdominal aorta and its branches.  The appendix is normal in caliber and contains air, without evidence for appendicitis.  The colon is unremarkable in appearance.  The bladder is mildly distended and grossly unremarkable in appearance.  The prostate remains borderline normal in size, with scattered calcification.  No inguinal lymphadenopathy is seen.  No acute osseous abnormalities are identified.  The  patient is status post posterior lumbar spinal fusion at L4-L5, with associated decompression.  IMPRESSION:  1.  No acute abnormalities seen within the abdomen or pelvis. 2.  2.5 cm left renal lesion is nonspecific, first characterized around 2011.  It remains relatively stable in size, but malignancy cannot be excluded.  Smaller 1.0 cm renal hypodensity also seen. MRI with and without contrast would be helpful for further evaluation, when and as deemed clinically appropriate. 3.  2.2 cm right adrenal adenoma seen. 4.  Scattered calcification along the distal abdominal aorta and its branches.  Original Report Authenticated By: JEFFREY  Cherly Hensen, M.D.   Ct Abdomen Pelvis W Contrast  10/11/2011  *RADIOLOGY REPORT*  Clinical Data: Persistent vomiting.  Abdominal pain.  CT ABDOMEN AND PELVIS WITH CONTRAST  Technique:  Multidetector CT imaging of the abdomen and pelvis was performed following the standard protocol during bolus administration of intravenous contrast.  Contrast: OMNIPAQUE IOHEXOL 300 MG/ML  SOLN  Comparison: CT scan dated 09/21/2011  Findings: The liver, spleen, pancreas, left adrenal gland, and right kidney are normal.  There is a stable benign lesion in the right adrenal gland.  Nonspecific 2.3 cm lesion in the inferior medial aspect of the left kidney is unchanged since 08/22/2011. It probably represents an atypical cyst but was not identified on ultrasound exam of 08/22/2011.  It is not visible on prior CT scan of 04/01/2009.  There is no significant adenopathy.  No dilated loops of bowel. Terminal ileum and appendix are normal.  No acute osseous abnormality.  IMPRESSION: No acute abnormalities of the abdomen or pelvis.  Persistent indeterminate low density lesion in the inferior medial aspect of the left kidney.  Follow-up with abdominal MRI with/without contrast would be useful.  Original Report Authenticated By: Gwynn Burly, M.D.   Dg Abd Acute W/chest  11/01/2011  *RADIOLOGY REPORT*   Clinical Data: Abdominal pain with fever, nausea, and vomiting.  ACUTE ABDOMEN SERIES (ABDOMEN 2 VIEW & CHEST 1 VIEW)  Comparison: 07/03 and 10/23/2011  Findings: The patient has chronic interstitial lung disease at the bases.  Heart size and pulmonary vascularity are normal.  No free air or free fluid in the abdomen.  Bowel gas pattern is normal.  Amount of stool in the colon has been reduced since 10/30/2011.  Evidence of prior lower lumbar fusion and cholecystectomy.  IMPRESSION: Benign-appearing abdomen and chest.  Diminished stool in the colon since the prior exam.  Original Report Authenticated By: Gwynn Burly, M.D.   Dg Abd Acute W/chest  10/30/2011  *RADIOLOGY REPORT*  Clinical Data: Abdominal pain, cough, smoker.  ACUTE ABDOMEN SERIES (ABDOMEN 2 VIEW & CHEST 1 VIEW)  Comparison: 10/23/2011  Findings: Peribronchial thickening and diffuse interstitial prominence again noted throughout the lungs compatible with chronic interstitial lung disease.  Heart is normal size.  No effusions.  Prior cholecystectomy.  Large stool burden throughout the colon. No evidence of bowel obstruction or free air.  No organomegaly or suspicious calcification.  Prior posterior lower lumbar fusion.  IMPRESSION: Large stool burden.  No obstruction or free air.  Chronic interstitial lung disease.  Original Report Authenticated By: Cyndie Chime, M.D.   Dg Abd Acute W/chest  10/23/2011  *RADIOLOGY REPORT*  Clinical Data: Hypertension and diabetes.  Abdominal pain, vomiting and diarrhea.  ACUTE ABDOMEN SERIES (ABDOMEN 2 VIEW & CHEST 1 VIEW)  Comparison: 10/17/2011 and multiple previous  Findings: No evidence of ileus, obstruction or free air.  There is large amount of fecal matter in the colon.  Previous spinal fusion procedure noted.  Previous cholecystectomy clips.  One-view chest shows chronic interstitial lung markings.  No sign of active infiltrate, mass, effusion or collapse.  No free air.  IMPRESSION: Large amount of  fecal matter.  No other acute abdominal finding.  Chronic interstitial lung disease.  Original Report Authenticated By: Thomasenia Sales, M.D.   Dg Abd Acute W/chest  10/17/2011  *RADIOLOGY REPORT*  Clinical Data: Lower abdominal pain for several months; vomiting.  ACUTE ABDOMEN SERIES (ABDOMEN 2 VIEW & CHEST 1 VIEW)  Comparison: Chest and abdominal radiographs performed 10/16/2011  Findings: The lungs are well-aerated.  Chronic bronchitic changes are grossly unchanged in appearance, with underlying chronic vascular congestion.  There is no evidence of pleural effusion or pneumothorax.  The cardiomediastinal silhouette is within normal limits.  The visualized bowel gas pattern is nonspecific.  There is mild distension of small bowel loops to 3.3 cm in maximal diameter, without definite evidence of obstruction.  Air and stool are seen within the colon.  No free intra-abdominal air is identified on the provided upright view.  No acute osseous abnormalities are seen; the sacroiliac joints are unremarkable in appearance.  Clips are noted within the right upper quadrant, reflecting prior cholecystectomy.  Chronic left-sided rib deformity is seen.  Lumbar spinal fusion hardware is noted.  IMPRESSION:  1.  Nonspecific bowel gas pattern; mild distension of small bowel loops, without evidence for obstruction.  No free intra-abdominal air seen. 2.  Chronic bronchitic changes again noted, with underlying chronic vascular congestion.  No acute cardiopulmonary process seen.  Original Report Authenticated By: Tonia Ghent, M.D.   Dg Abd Acute W/chest  10/16/2011  *RADIOLOGY REPORT*  Clinical Data: Left lower quadrant abdominal pain.  Diarrhea.  ACUTE ABDOMEN SERIES (ABDOMEN 2 VIEW & CHEST 1 VIEW)  Comparison: Acute abdominal series 09/21/2011.  Findings: The heart size is normal.  Chronic bronchitic changes are evident bilaterally.  No focal airspace disease is evident.  Supine and upright views of the abdomen demonstrate to  air-fluid levels within nondilated bowel.  Postsurgical changes are noted in the lumbar spine.  The patient is status post cholecystectomy.  IMPRESSION:  1.  Fluid levels within nondilated bowel, suggesting an ileus. 2.  Stable chronic bronchitic changes in the lungs bilaterally.  Original Report Authenticated By: Jamesetta Orleans. MATTERN, M.D.    ASSESSMENT AND PLAN: Present on Admission:  .Abdominal pain-acute on chronic pain  -Patient claims to have worsening of his chronic abdominal pain. Please note patient does have a history of chronic pancreatitis.  -Will see if he would tolerate a full liquid diet, place him on IV fluids and other supportive care.  -A. acute abdominal series is negative for any acute abnormalities.  -Continue with PPI, will add sucralfate   . Vomiting  -Not sure the exact etiology is. Perhaps this is from flare of chronic pancreatitis-however lipase is normal  -We'll continue with scheduled Reglan-however we changed this to IV  -Trial of full liquids, if he does not tolerate this then we'll downgrade or make him n.p.o.  .DIABETES MELLITUS, TYPE II -Continue Lantus and glipizide, hold metformin. -An SSI   .Narcotic dependence -Since vomiting he is not able to take oral narcotics, for now will place him on IV narcotics   .LOW BACK PAIN/neuropathy  -Continue as needed narcotics, continue Neurontin  -We'll add low-dose Cymbalta   .Constipation due to pain medication -Continue with lactulose and Senokot.   Marland KitchenGERD -Continue PPI  . Frequent falls -To show that numerous recent falls, claims that he lives alone and is unable to take care of himself anymore -On exam he appears to have no focal neurological deficits, however we'll go and get a CT of the head -PT/OT to see -Social worker evaluation Further plan will depend as patient's clinical course evolves and further radiologic and laboratory data become available. Patient will be monitored closely.  DVT  Prophylaxis: -Prophylactic Lovenox  Code Status: -Full code  Total time spent for admission equals 45 minutes.  Jeoffrey Massed 11/01/2011, 12:27 PM

## 2011-11-02 DIAGNOSIS — K5909 Other constipation: Secondary | ICD-10-CM

## 2011-11-02 LAB — COMPREHENSIVE METABOLIC PANEL
AST: 9 U/L (ref 0–37)
Albumin: 3.1 g/dL — ABNORMAL LOW (ref 3.5–5.2)
Calcium: 9.4 mg/dL (ref 8.4–10.5)
Creatinine, Ser: 0.62 mg/dL (ref 0.50–1.35)
GFR calc non Af Amer: >90 mL/min (ref >90)
Total Protein: 6.5 g/dL (ref 6.0–8.3)

## 2011-11-02 LAB — PROTIME-INR: INR: 0.95 (ref 0.00–1.49)

## 2011-11-02 LAB — GLUCOSE, CAPILLARY
Glucose-Capillary: 212 mg/dL — ABNORMAL HIGH (ref 70–99)
Glucose-Capillary: 162 mg/dL — ABNORMAL HIGH (ref 70–99)
Glucose-Capillary: 260 mg/dL — ABNORMAL HIGH (ref 70–99)

## 2011-11-02 LAB — CBC
MCH: 30.6 pg (ref 26.0–34.0)
MCHC: 32.6 g/dL (ref 30.0–36.0)
MCV: 93.7 fL (ref 78.0–100.0)
Platelets: 205 10*3/uL (ref 150–400)
RDW: 14.1 % (ref 11.5–15.5)

## 2011-11-02 MED ORDER — METOCLOPRAMIDE HCL 5 MG PO TABS
5.0000 mg | ORAL_TABLET | Freq: Three times a day (TID) | ORAL | Status: DC
Start: 2011-11-02 — End: 2011-11-04
  Administered 2011-11-02 – 2011-11-04 (×9): 5 mg via ORAL
  Filled 2011-11-02 (×12): qty 1

## 2011-11-02 MED ORDER — PANTOPRAZOLE SODIUM 40 MG PO TBEC
40.0000 mg | DELAYED_RELEASE_TABLET | Freq: Every day | ORAL | Status: DC
  Administered 2011-11-02 – 2011-11-04 (×3): 40 mg via ORAL
  Filled 2011-11-02 (×3): qty 1

## 2011-11-02 MED FILL — Sennosides-Docusate Sodium Tab 8.6-50 MG: ORAL | Qty: 1 | Status: AC

## 2011-11-02 NOTE — Evaluation (Signed)
Physical Therapy Evaluation Patient Details Name: Ryan [name] MRN: [id number] DOB: 02-09-1951 Today's Date: 11/02/2011 Time: 2952-8413 PT Time Calculation (min): 14 min  PT Assessment / Plan / Recommendation Clinical Impression  Pt adm with abd pain.  Pt reports frequent falls at home and inability to care for himself.  Recommend ST-SNF and pt agreeable.  Needs continued PT to maximize I and safety to improve quality of life and eventually return home.    PT Assessment  Patient needs continued PT services    Follow Up Recommendations  Skilled nursing facility    Barriers to Discharge Decreased caregiver support      Equipment Recommendations  Defer to next venue    Recommendations for Other Services     Frequency Min 2X/week    Precautions / Restrictions Precautions Precautions: Fall Restrictions Weight Bearing Restrictions: No   Pertinent Vitals/Pain Chronic Abd pain 8/10      Mobility  Transfers Transfers: Sit to Stand;Stand to Sit Sit to Stand: 4: Min assist;With upper extremity assist;From bed Stand to Sit: 4: Min assist;With upper extremity assist;To bed Details for Transfer Assistance: Assist for balance  Ambulation/Gait Ambulation/Gait Assistance: 4: Min assist Ambulation Distance (Feet): 10 Feet Assistive device: Rolling walker Ambulation/Gait Assistance Details: Verbal cues to stay closer to walker Gait Pattern: Wide base of support;Decreased stride length;Step-through pattern;Trunk flexed    Exercises     PT Diagnosis: Difficulty walking;Generalized weakness  PT Problem List: Decreased strength;Decreased activity tolerance;Decreased balance;Decreased mobility;Decreased knowledge of use of DME PT Treatment Interventions: DME instruction;Gait training;Functional mobility training;Patient/family education;Therapeutic activities;Therapeutic exercise;Balance training   PT Goals Acute Rehab PT Goals PT Goal Formulation: With patient Time For Goal  Achievement: 11/02/11 Potential to Achieve Goals: Good Pt will go Supine/Side to Sit: with modified independence PT Goal: Supine/Side to Sit - Progress: Goal set today Pt will go Sit to Supine/Side: with modified independence PT Goal: Sit to Supine/Side - Progress: Goal set today Pt will go Sit to Stand: with modified independence PT Goal: Sit to Stand - Progress: Goal set today Pt will go Stand to Sit: with modified independence PT Goal: Stand to Sit - Progress: Goal set today Pt will Ambulate: 51 - 150 feet;with modified independence;with least restrictive assistive device PT Goal: Ambulate - Progress: Goal set today  Visit Information  Last PT Received On: 11/02/11 Assistance Needed: +1    Subjective Data  Subjective: "[name]" Patient Stated Goal: [name]   Prior Functioning  Home Living Lives With: Alone Type of Home: Apartment Home Access: Level entry Home Layout: One level Bathroom Shower/Tub: Engineer, manufacturing systems: Standard Home Adaptive Equipment: Environmental consultant - four wheeled Prior Function Level of Independence: Needs assistance Needs Assistance: Meal Prep Driving: No Vocation: On disability Communication Communication: No difficulties    Cognition  Overall Cognitive Status: Appears within functional limits for tasks assessed/performed Arousal/Alertness: Awake/alert Orientation Level: Appears intact for tasks assessed Behavior During Session: [facility] for tasks performed    Extremity/Trunk Assessment Right Lower Extremity Assessment RLE ROM/Strength/Tone: Deficits RLE ROM/Strength/Tone Deficits: grossly 4-/5 RLE Sensation: History of peripheral neuropathy Left Lower Extremity Assessment LLE ROM/Strength/Tone: Deficits LLE ROM/Strength/Tone Deficits: grossly 4-/5 LLE Sensation: History of peripheral neuropathy   Balance Static Standing Balance Static Standing - Balance Support: Bilateral upper  extremity supported (on walker) Static Standing - Level of Assistance: 5: Stand by assistance  End of Session PT - End of Session Equipment Utilized During  Treatment: Other (comment) (pt too large for gait belt) Activity Tolerance: Patient limited by pain;Patient limited by fatigue Patient left: in bed Nurse Communication: Mobility status  GP     [facility] 11/02/2011, 11:52 AM  Skip Mayer PT [phone]

## 2011-11-02 NOTE — Progress Notes (Signed)
PATIENT DETAILS Name: Ryan [name] Age: 61 y.o. Sex: male Date of Birth: [birth year] Admit Date: 11/01/2011 AVW:[id number],WGNFAO, MD  Subjective: Still having abdominal pain-chronic issue Claims to have vomiting-but not seen by RN  Objective: Vital signs in last 24 hours: Filed Vitals:   11/01/11 1137 11/01/11 1202 11/01/11 2219 11/02/11 0703  BP: 151/81  154/91 150/89  Pulse: 75  76 63  Temp: 98.5 F (36.9 C)  98.6 F (37 C) 98.7 F (37.1 C)  TempSrc: Oral   Oral  Resp: 20  20 18   Height: 6\' 6"  (1.981 m) 6\' 6"  (1.981 m)    Weight: 142.2 kg (313 lb 7.9 oz) 142.2 kg (313 lb 7.9 oz)    SpO2: 98%  94% 96%    Weight change:   Body mass index is 36.23 kg/(m^2).  Intake/Output from previous day:  Intake/Output Summary (Last 24 hours) at 11/02/11 0951 Last data filed at 11/02/11 0442  Gross per 24 hour  Intake 1100.83 ml  Output    650 ml  Net 450.83 ml    PHYSICAL EXAM: Gen Exam: Awake and alert with clear speech.  Neck: Supple, No JVD.  Chest: B/L Clear.   CVS: S1 S2 Regular, no murmurs.  Abdomen: soft, BS +, mildly  Tender epigastric area with no rebound or rigidity. Obese Extremities: no edema, lower extremities warm to touch. Neurologic: Non Focal.   Skin: No Rash.   Wounds: N/A.    CONSULTS:  None  LAB RESULTS: CBC  Lab 11/02/11 0610 11/01/11 1300 11/01/11 0750 10/30/11 1802  WBC 4.8 6.2 5.1 8.3  HGB 12.6* 13.0 13.2 12.7*  HCT 38.6* 39.7 40.2 38.5*  PLT 205 196 172 197  MCV 93.7 94.7 95.0 93.9  MCH 30.6 31.0 31.2 31.0  MCHC 32.6 32.7 32.8 33.0  RDW 14.1 14.2 14.0 14.0  LYMPHSABS -- -- 2.0 1.3  MONOABS -- -- 0.5 0.7  EOSABS -- -- 0.1 0.1  BASOSABS -- -- 0.0 0.0  BANDABS -- -- -- --    Chemistries   Lab 11/02/11 0610 11/01/11 1300 11/01/11 0750 10/30/11 1802  NA 141 -- 140 132*  K 4.3 -- 4.1 4.2  CL 107 -- 101 91*  CO2 26 -- 29 26  GLUCOSE 135* -- 325* 397*  BUN 5* -- 7 12  CREATININE 0.62 0.69 0.77 0.80  CALCIUM 9.4 -- 9.2 9.6  MG  -- -- -- --    CBG:  Lab 11/02/11 0804 11/01/11 2124 11/01/11 1734 11/01/11 1237 11/01/11 0650  GLUCAP 162* 212* 235* 165* 329*    GFR Estimated Creatinine Clearance: 155.1 ml/min (by C-G formula based on Cr of 0.62).  Coagulation profile  Lab 11/02/11 0610  INR 0.95  PROTIME --    Cardiac Enzymes No results found for this basename: CK:3,CKMB:3,TROPONINI:3,MYOGLOBIN:3 in the last 168 hours  No components found with this basename: POCBNP:3 No results found for this basename: DDIMER:2 in the last 72 hours No results found for this basename: HGBA1C:2 in the last 72 hours No results found for this basename: CHOL:2,HDL:2,LDLCALC:2,TRIG:2,CHOLHDL:2,LDLDIRECT:2 in the last 72 hours No results found for this basename: TSH,T4TOTAL,FREET3,T3FREE,THYROIDAB in the last 72 hours No results found for this basename: VITAMINB12:2,FOLATE:2,FERRITIN:2,TIBC:2,IRON:2,RETICCTPCT:2 in the last 72 hours  Basename 11/01/11 0750 10/30/11 1802  LIPASE 20 13  AMYLASE -- --    Urine Studies No results found for this basename: UACOL:2,UAPR:2,USPG:2,UPH:2,UTP:2,UGL:2,UKET:2,UBIL:2,UHGB:2,UNIT:2,UROB:2,ULEU:2,UEPI:2,UWBC:2,URBC:2,UBAC:2,CAST:2,CRYS:2,UCOM:2,BILUA:2 in the last 72 hours  MICROBIOLOGY: Recent Results (from the past 240 hour(s))  MRSA PCR SCREENING  Status: Normal   Collection Time   11/01/11 11:40 AM      Component Value Range Status Comment   MRSA by PCR NEGATIVE  NEGATIVE Final     RADIOLOGY STUDIES/RESULTS: Ct Head Wo Contrast  11/01/2011  *RADIOLOGY REPORT*  Clinical Data: Frequent falling  CT HEAD WITHOUT CONTRAST  Technique:  Contiguous axial images were obtained from the base of the skull through the vertex without contrast.  Comparison: 08/10/2009  Findings: The brain does not show any evidence of malformation, atrophy, old or acute infarction, mass lesion, hemorrhage, hydrocephalus or extra-axial collection.  Incidental cavum septum pellucidum.  Middle ears and mastoids are  clear.  Sinuses are clear except for some opacification in the left ethmoid and sphenoid region.  IMPRESSION: Normal appearance of the brain.  Some fluid in the left ethmoid and sphenoid sinuses.  Original Report Authenticated By: Thomasenia Sales, M.D.   Nm Gastric Emptying  10/21/2011  *RADIOLOGY REPORT*  Clinical Data:  Nausea and vomiting  NUCLEAR MEDICINE GASTRIC EMPTYING SCAN  Technique:  After oral ingestion of radiolabeled meal, sequential abdominal images were obtained for 120 minutes.  Residual percentage of activity remaining within the stomach was calculated at 60 and 120 minutes.  Radiopharmaceutical:  2.0  mCi Tc-70m sulfur colloid.  Comparison:  10/19/2011  Findings: After 60 minutes there is 68% radiotracer activity remaining in the stomach.  After 120 minutes there is a 4% radiotracer activity remaining in the stomach.  IMPRESSION:  1.  Normal gastric emptying.  Original Report Authenticated By: Rosealee Albee, M.D.   Ct Abdomen Pelvis W Contrast  10/19/2011  *RADIOLOGY REPORT*  Clinical Data: Diffuse abdominal pain, nausea, vomiting and diarrhea.  CT ABDOMEN AND PELVIS WITH CONTRAST  Technique:  Multidetector CT imaging of the abdomen and pelvis was performed following the standard protocol during bolus administration of intravenous contrast.  Contrast: OMNIPAQUE IOHEXOL 300 MG/ML  SOLN  Comparison: CT of the abdomen and pelvis performed 10/11/2011  Findings: Minimal bibasilar atelectasis is noted.  Prominence of the intrahepatic biliary ducts likely reflects prior cholecystectomy, with clips noted at the gallbladder fossa.  The liver is otherwise unremarkable in appearance.  The spleen is within normal limits.  The pancreas is grossly unremarkable.  A 2.2 cm right adrenal adenoma is again noted.  The left adrenal gland is unremarkable in appearance.  A vague 2.5 cm hypodense lesion within the medial left kidney remains nondescript, first characterized around 2011.  It remains  relatively stable in size, but malignancy cannot be entirely excluded.  A smaller 1.0 cm hypodensity is noted at the interpole region of the left kidney.  MRI could be considered for further evaluation, as previously recommended.  No free fluid is identified.  The small bowel is unremarkable in appearance.  The stomach is within normal limits.  No acute vascular abnormalities are seen.  Scattered calcification is noted along the distal abdominal aorta and its branches.  The appendix is normal in caliber and contains air, without evidence for appendicitis.  The colon is unremarkable in appearance.  The bladder is mildly distended and grossly unremarkable in appearance.  The prostate remains borderline normal in size, with scattered calcification.  No inguinal lymphadenopathy is seen.  No acute osseous abnormalities are identified.  The patient is status post posterior lumbar spinal fusion at L4-L5, with associated decompression.  IMPRESSION:  1.  No acute abnormalities seen within the abdomen or pelvis. 2.  2.5 cm left renal lesion is nonspecific, first characterized  around 2011.  It remains relatively stable in size, but malignancy cannot be excluded.  Smaller 1.0 cm renal hypodensity also seen. MRI with and without contrast would be helpful for further evaluation, when and as deemed clinically appropriate. 3.  2.2 cm right adrenal adenoma seen. 4.  Scattered calcification along the distal abdominal aorta and its branches.  Original Report Authenticated By: Tonia Ghent, M.D.   Ct Abdomen Pelvis W Contrast  10/11/2011  *RADIOLOGY REPORT*  Clinical Data: Persistent vomiting.  Abdominal pain.  CT ABDOMEN AND PELVIS WITH CONTRAST  Technique:  Multidetector CT imaging of the abdomen and pelvis was performed following the standard protocol during bolus administration of intravenous contrast.  Contrast: OMNIPAQUE IOHEXOL 300 MG/ML  SOLN  Comparison: CT scan dated 09/21/2011  Findings: The liver, spleen,  pancreas, left adrenal gland, and right kidney are normal.  There is a stable benign lesion in the right adrenal gland.  Nonspecific 2.3 cm lesion in the inferior medial aspect of the left kidney is unchanged since 08/22/2011. It probably represents an atypical cyst but was not identified on ultrasound exam of 08/22/2011.  It is not visible on prior CT scan of 04/01/2009.  There is no significant adenopathy.  No dilated loops of bowel. Terminal ileum and appendix are normal.  No acute osseous abnormality.  IMPRESSION: No acute abnormalities of the abdomen or pelvis.  Persistent indeterminate low density lesion in the inferior medial aspect of the left kidney.  Follow-up with abdominal MRI with/without contrast would be useful.  Original Report Authenticated By: Gwynn Burly, M.D.   Dg Abd Acute W/chest  11/01/2011  *RADIOLOGY REPORT*  Clinical Data: Abdominal pain with fever, nausea, and vomiting.  ACUTE ABDOMEN SERIES (ABDOMEN 2 VIEW & CHEST 1 VIEW)  Comparison: 07/03 and 10/23/2011  Findings: The patient has chronic interstitial lung disease at the bases.  Heart size and pulmonary vascularity are normal.  No free air or free fluid in the abdomen.  Bowel gas pattern is normal.  Amount of stool in the colon has been reduced since 10/30/2011.  Evidence of prior lower lumbar fusion and cholecystectomy.  IMPRESSION: Benign-appearing abdomen and chest.  Diminished stool in the colon since the prior exam.  Original Report Authenticated By: Gwynn Burly, M.D.   Dg Abd Acute W/chest  10/30/2011  *RADIOLOGY REPORT*  Clinical Data: Abdominal pain, cough, smoker.  ACUTE ABDOMEN SERIES (ABDOMEN 2 VIEW & CHEST 1 VIEW)  Comparison: 10/23/2011  Findings: Peribronchial thickening and diffuse interstitial prominence again noted throughout the lungs compatible with chronic interstitial lung disease.  Heart is normal size.  No effusions.  Prior cholecystectomy.  Large stool burden throughout the colon. No evidence of  bowel obstruction or free air.  No organomegaly or suspicious calcification.  Prior posterior lower lumbar fusion.  IMPRESSION: Large stool burden.  No obstruction or free air.  Chronic interstitial lung disease.  Original Report Authenticated By: Cyndie Chime, M.D.   Dg Abd Acute W/chest  10/23/2011  *RADIOLOGY REPORT*  Clinical Data: Hypertension and diabetes.  Abdominal pain, vomiting and diarrhea.  ACUTE ABDOMEN SERIES (ABDOMEN 2 VIEW & CHEST 1 VIEW)  Comparison: 10/17/2011 and multiple previous  Findings: No evidence of ileus, obstruction or free air.  There is large amount of fecal matter in the colon.  Previous spinal fusion procedure noted.  Previous cholecystectomy clips.  One-view chest shows chronic interstitial lung markings.  No sign of active infiltrate, mass, effusion or collapse.  No free air.  IMPRESSION: Large  amount of fecal matter.  No other acute abdominal finding.  Chronic interstitial lung disease.  Original Report Authenticated By: Thomasenia Sales, M.D.   Dg Abd Acute W/chest  10/17/2011  *RADIOLOGY REPORT*  Clinical Data: Lower abdominal pain for several months; vomiting.  ACUTE ABDOMEN SERIES (ABDOMEN 2 VIEW & CHEST 1 VIEW)  Comparison: Chest and abdominal radiographs performed 10/16/2011  Findings: The lungs are well-aerated.  Chronic bronchitic changes are grossly unchanged in appearance, with underlying chronic vascular congestion.  There is no evidence of pleural effusion or pneumothorax.  The cardiomediastinal silhouette is within normal limits.  The visualized bowel gas pattern is nonspecific.  There is mild distension of small bowel loops to 3.3 cm in maximal diameter, without definite evidence of obstruction.  Air and stool are seen within the colon.  No free intra-abdominal air is identified on the provided upright view.  No acute osseous abnormalities are seen; the sacroiliac joints are unremarkable in appearance.  Clips are noted within the right upper quadrant, reflecting  prior cholecystectomy.  Chronic left-sided rib deformity is seen.  Lumbar spinal fusion hardware is noted.  IMPRESSION:  1.  Nonspecific bowel gas pattern; mild distension of small bowel loops, without evidence for obstruction.  No free intra-abdominal air seen. 2.  Chronic bronchitic changes again noted, with underlying chronic vascular congestion.  No acute cardiopulmonary process seen.  Original Report Authenticated By: Tonia Ghent, M.D.   Dg Abd Acute W/chest  10/16/2011  *RADIOLOGY REPORT*  Clinical Data: Left lower quadrant abdominal pain.  Diarrhea.  ACUTE ABDOMEN SERIES (ABDOMEN 2 VIEW & CHEST 1 VIEW)  Comparison: Acute abdominal series 09/21/2011.  Findings: The heart size is normal.  Chronic bronchitic changes are evident bilaterally.  No focal airspace disease is evident.  Supine and upright views of the abdomen demonstrate to air-fluid levels within nondilated bowel.  Postsurgical changes are noted in the lumbar spine.  The patient is status post cholecystectomy.  IMPRESSION:  1.  Fluid levels within nondilated bowel, suggesting an ileus. 2.  Stable chronic bronchitic changes in the lungs bilaterally.  Original Report Authenticated By: Jamesetta Orleans. MATTERN, M.D.    MEDICATIONS: Scheduled Meds:   . ALPRAZolam  1 mg Oral BID  . aspirin EC  81 mg Oral Daily  . DULoxetine  30 mg Oral Daily  . enoxaparin (LOVENOX) injection  40 mg Subcutaneous Q24H  . gabapentin  400 mg Oral TID  . glipiZIDE  10 mg Oral BID WC  . insulin aspart  0-15 Units Subcutaneous TID WC  . insulin glargine  10 Units Subcutaneous QHS  . lactulose  20 g Oral BID  . losartan  100 mg Oral Daily  . metoCLOPramide (REGLAN) injection  5 mg Intravenous TID AC & HS  . pantoprazole  40 mg Oral Q1200  . senna-docusate  1 tablet Oral Daily  . sucralfate  1 g Oral TID WC & HS  . DISCONTD: sodium chloride   Intravenous STAT  . DISCONTD: pantoprazole (PROTONIX) IV  40 mg Intravenous Q24H  . DISCONTD: sennosides-docusate  sodium  1 tablet Oral Daily   Continuous Infusions:   . DISCONTD: sodium chloride 999 mL (11/01/11 0846)  . DISCONTD: sodium chloride 100 mL/hr at 11/01/11 1245   PRN Meds:.acetaminophen, acetaminophen, albuterol, bisacodyl, HYDROmorphone (DILAUDID) injection, ondansetron (ZOFRAN) IV, ondansetron, oxycodone, sodium phosphate  Antibiotics: Anti-infectives    None      Assessment/Plan: Principal Problem:  *Abdominal pain -likely related to chronic pancreatitis/Gastritis -change PPI to oral -c/w Carafate -  as needed narcotics-will slowly need to minimize and stop IV Dilaudid-especially if vomiting  Vomiting -Claims to have vomited twice yesterday-but RN has not witnessed any  -has tolerated breakfast this am -change reglan to oral -has had extennsive w/u including EGD, Gastric emptying study and numerous CT scans of Abd over the past several months. Abd Xray neg for Bowel obstruction-do not plan on any further work up this admission-unless clinical situation changes  Frequent Falls -claims to have had frequent falls at home -CT head 7/5 negative -get PT/OT  Chronic Pain syndrome with Narcotic seeking behaviour -at baseline -once diet stabilizes-will stop/minimize IV dilauid  Constipation -both RN and patient confirm numerous BM's -continue current bowel regimen -Xray Abdomen on 7/5 showed decreased stool burden as compared to before  .LOW BACK PAIN/neuropathy  -Continue as needed narcotics, continue Neurontin  -c/w Cymbalta   .DIABETES MELLITUS, TYPE II  -Continue Lantus and glipizide, hold metformin while inpatient -C/w SSI  .GERD  -Continue PPI and carafate  . HTN -moderate control with Losartan -monitor BP trend and add another agent if needed  Disposition: -remain inpatient-get PT/OT and social worker to evaluate  DVT Prophylaxis: -prophylactic Lovenox  Code Status: Full Code  Jeoffrey Massed, MD  [facility] Pager [phone]  If  7PM-7AM, please contact night-coverage [email or web address] Password [email or web address] 11/02/2011, 9:51 AM   LOS: 1 day

## 2011-11-03 LAB — GLUCOSE, CAPILLARY: Glucose-Capillary: 240 mg/dL — ABNORMAL HIGH (ref 70–99)

## 2011-11-03 MED ORDER — OXYCODONE HCL 5 MG PO TABS
15.0000 mg | ORAL_TABLET | ORAL | Status: DC | PRN
  Administered 2011-11-03 – 2011-11-04 (×5): 15 mg via ORAL
  Filled 2011-11-03 (×5): qty 3

## 2011-11-03 MED ORDER — OXYCODONE HCL 5 MG PO TABS: 15.0000 mg | ORAL_TABLET | ORAL | Status: DC | PRN

## 2011-11-03 MED ORDER — OXYCODONE HCL 20 MG PO TB12
40.0000 mg | ORAL_TABLET | Freq: Two times a day (BID) | ORAL | Status: DC
  Administered 2011-11-03 (×2): 40 mg via ORAL
  Filled 2011-11-03 (×2): qty 2

## 2011-11-03 NOTE — Progress Notes (Addendum)
Clinical Social Work Department CLINICAL SOCIAL WORK PLACEMENT NOTE 11/03/2011  Patient:  JENKINS, RISDON  Account Number:  [id number] Admit date:  11/01/2011  Clinical Social Worker:  Dellie Burns, Theresia Majors  Date/time:  11/02/2011 01:00 PM Note completed by CSW: Lia Foyer, LCSWA.  Clinical Social Work is seeking post-discharge placement for this patient at the following level of care:   SKILLED NURSING   (*CSW will update this form in Epic as items are completed)   11/02/2011  Patient/family provided with [facility] Department of Clinical Social Work's list of facilities offering this level of care within the geographic area requested by the patient (or if unable, by the patient's family).  11/02/2011  Patient/family informed of their freedom to choose among providers that offer the needed level of care, that participate in Medicare, Medicaid or managed care program needed by the patient, have an available bed and are willing to accept the patient.  11/02/2011  Patient/family informed of [facility]' ownership interest in [facility], as well as of the fact that they are under no obligation to receive care at this facility.  PASARR submitted to EDS on  PASARR number received from EDS on   FL2 transmitted to all facilities in geographic area requested by pt/family on  11/02/2011 FL2 transmitted to all facilities within larger geographic area on   Patient informed that his/her managed care company has contracts with or will negotiate with  certain facilities, including the following:     Patient/family informed of bed offers received:  11/04/2011 Patient chooses bed at [facility] Physician recommends and patient chooses bed at    Patient to be transferred to  on  [facility] on 11/04/2011 Patient to be transferred to facility by ambulance  The following physician request were entered in Epic:   Additional Comments: Patient with existing  PASRR.  Lia Foyer, LCSWA [facility] Clinical Social Worker Contact #: [phone] (weekend)

## 2011-11-03 NOTE — Progress Notes (Signed)
PATIENT DETAILS Name: Ryan [name] Age: 61 y.o. Sex: male Date of Birth: 25-May-1950 Admit Date: 11/01/2011 AVW:[id number],WGNFAO, MD  Subjective: Still having abdominal pain-chronic issue Vomiting resolved-tolerating diet  Objective: Vital signs in last 24 hours: Filed Vitals:   11/02/11 0703 11/02/11 1500 11/02/11 2109 11/03/11 0642  BP: 150/89 126/73 153/88 148/79  Pulse: 63 68 64 60  Temp: 98.7 F (37.1 C) 98.2 F (36.8 C) 98.3 F (36.8 C) 98.1 F (36.7 C)  TempSrc: Oral Oral Oral Oral  Resp: 18 18 18 18   Height:      Weight:    145.7 kg (321 lb 3.4 oz)  SpO2: 96% 96% 92% 95%    Weight change: 3.5 kg (7 lb 11.5 oz)  Body mass index is 37.12 kg/(m^2).  Intake/Output from previous day:  Intake/Output Summary (Last 24 hours) at 11/03/11 0917 Last data filed at 11/03/11 0531  Gross per 24 hour  Intake    600 ml  Output    900 ml  Net   -300 ml    PHYSICAL EXAM: Gen Exam: Awake and alert with clear speech.  Neck: Supple, No JVD.  Chest: B/L Clear.  No rales or rhonchi CVS: S1 S2 Regular, no murmurs.  Abdomen: soft, BS +, mildly  Tender epigastric area with no rebound or rigidity. Obese Extremities: no edema, lower extremities warm to touch. Neurologic: Non Focal.   Skin: No Rash.   Wounds: N/A.    CONSULTS:  None  LAB RESULTS: CBC  Lab 11/02/11 0610 11/01/11 1300 11/01/11 0750 10/30/11 1802  WBC 4.8 6.2 5.1 8.3  HGB 12.6* 13.0 13.2 12.7*  HCT 38.6* 39.7 40.2 38.5*  PLT 205 196 172 197  MCV 93.7 94.7 95.0 93.9  MCH 30.6 31.0 31.2 31.0  MCHC 32.6 32.7 32.8 33.0  RDW 14.1 14.2 14.0 14.0  LYMPHSABS -- -- 2.0 1.3  MONOABS -- -- 0.5 0.7  EOSABS -- -- 0.1 0.1  BASOSABS -- -- 0.0 0.0  BANDABS -- -- -- --    Chemistries   Lab 11/02/11 0610 11/01/11 1300 11/01/11 0750 10/30/11 1802  NA 141 -- 140 132*  K 4.3 -- 4.1 4.2  CL 107 -- 101 91*  CO2 26 -- 29 26  GLUCOSE 135* -- 325* 397*  BUN 5* -- 7 12  CREATININE 0.62 0.69 0.77 0.80  CALCIUM 9.4  -- 9.2 9.6  MG -- -- -- --    CBG:  Lab 11/03/11 0745 11/02/11 2108 11/02/11 1712 11/02/11 1215 11/02/11 0804  GLUCAP 183* 176* 254* 260* 162*    GFR Estimated Creatinine Clearance: 157.1 ml/min (by C-G formula based on Cr of 0.62).  Coagulation profile  Lab 11/02/11 0610  INR 0.95  PROTIME --    Cardiac Enzymes No results found for this basename: CK:3,CKMB:3,TROPONINI:3,MYOGLOBIN:3 in the last 168 hours  No components found with this basename: POCBNP:3 No results found for this basename: DDIMER:2 in the last 72 hours No results found for this basename: HGBA1C:2 in the last 72 hours No results found for this basename: CHOL:2,HDL:2,LDLCALC:2,TRIG:2,CHOLHDL:2,LDLDIRECT:2 in the last 72 hours No results found for this basename: TSH,T4TOTAL,FREET3,T3FREE,THYROIDAB in the last 72 hours No results found for this basename: VITAMINB12:2,FOLATE:2,FERRITIN:2,TIBC:2,IRON:2,RETICCTPCT:2 in the last 72 hours  Basename 11/01/11 0750  LIPASE 20  AMYLASE --    Urine Studies No results found for this basename: UACOL:2,UAPR:2,USPG:2,UPH:2,UTP:2,UGL:2,UKET:2,UBIL:2,UHGB:2,UNIT:2,UROB:2,ULEU:2,UEPI:2,UWBC:2,URBC:2,UBAC:2,CAST:2,CRYS:2,UCOM:2,BILUA:2 in the last 72 hours  MICROBIOLOGY: Recent Results (from the past 240 hour(s))  MRSA PCR SCREENING     Status: Normal  Collection Time   11/01/11 11:40 AM      Component Value Range Status Comment   MRSA by PCR NEGATIVE  NEGATIVE Final     RADIOLOGY STUDIES/RESULTS: Ct Head Wo Contrast  11/01/2011  *RADIOLOGY REPORT*  Clinical Data: Frequent falling  CT HEAD WITHOUT CONTRAST  Technique:  Contiguous axial images were obtained from the base of the skull through the vertex without contrast.  Comparison: 08/10/2009  Findings: The brain does not show any evidence of malformation, atrophy, old or acute infarction, mass lesion, hemorrhage, hydrocephalus or extra-axial collection.  Incidental cavum septum pellucidum.  Middle ears and mastoids are  clear.  Sinuses are clear except for some opacification in the left ethmoid and sphenoid region.  IMPRESSION: Normal appearance of the brain.  Some fluid in the left ethmoid and sphenoid sinuses.  Original Report Authenticated By: Thomasenia Sales, M.D.   Nm Gastric Emptying  10/21/2011  *RADIOLOGY REPORT*  Clinical Data:  Nausea and vomiting  NUCLEAR MEDICINE GASTRIC EMPTYING SCAN  Technique:  After oral ingestion of radiolabeled meal, sequential abdominal images were obtained for 120 minutes.  Residual percentage of activity remaining within the stomach was calculated at 60 and 120 minutes.  Radiopharmaceutical:  2.0  mCi Tc-7m sulfur colloid.  Comparison:  10/19/2011  Findings: After 60 minutes there is 68% radiotracer activity remaining in the stomach.  After 120 minutes there is a 4% radiotracer activity remaining in the stomach.  IMPRESSION:  1.  Normal gastric emptying.  Original Report Authenticated By: Rosealee Albee, M.D.   Ct Abdomen Pelvis W Contrast  10/19/2011  *RADIOLOGY REPORT*  Clinical Data: Diffuse abdominal pain, nausea, vomiting and diarrhea.  CT ABDOMEN AND PELVIS WITH CONTRAST  Technique:  Multidetector CT imaging of the abdomen and pelvis was performed following the standard protocol during bolus administration of intravenous contrast.  Contrast: OMNIPAQUE IOHEXOL 300 MG/ML  SOLN  Comparison: CT of the abdomen and pelvis performed 10/11/2011  Findings: Minimal bibasilar atelectasis is noted.  Prominence of the intrahepatic biliary ducts likely reflects prior cholecystectomy, with clips noted at the gallbladder fossa.  The liver is otherwise unremarkable in appearance.  The spleen is within normal limits.  The pancreas is grossly unremarkable.  A 2.2 cm right adrenal adenoma is again noted.  The left adrenal gland is unremarkable in appearance.  A vague 2.5 cm hypodense lesion within the medial left kidney remains nondescript, first characterized around 2011.  It remains  relatively stable in size, but malignancy cannot be entirely excluded.  A smaller 1.0 cm hypodensity is noted at the interpole region of the left kidney.  MRI could be considered for further evaluation, as previously recommended.  No free fluid is identified.  The small bowel is unremarkable in appearance.  The stomach is within normal limits.  No acute vascular abnormalities are seen.  Scattered calcification is noted along the distal abdominal aorta and its branches.  The appendix is normal in caliber and contains air, without evidence for appendicitis.  The colon is unremarkable in appearance.  The bladder is mildly distended and grossly unremarkable in appearance.  The prostate remains borderline normal in size, with scattered calcification.  No inguinal lymphadenopathy is seen.  No acute osseous abnormalities are identified.  The patient is status post posterior lumbar spinal fusion at L4-L5, with associated decompression.  IMPRESSION:  1.  No acute abnormalities seen within the abdomen or pelvis. 2.  2.5 cm left renal lesion is nonspecific, first characterized around 2011.  It  remains relatively stable in size, but malignancy cannot be excluded.  Smaller 1.0 cm renal hypodensity also seen. MRI with and without contrast would be helpful for further evaluation, when and as deemed clinically appropriate. 3.  2.2 cm right adrenal adenoma seen. 4.  Scattered calcification along the distal abdominal aorta and its branches.  Original Report Authenticated By: Tonia Ghent, M.D.   Ct Abdomen Pelvis W Contrast  10/11/2011  *RADIOLOGY REPORT*  Clinical Data: Persistent vomiting.  Abdominal pain.  CT ABDOMEN AND PELVIS WITH CONTRAST  Technique:  Multidetector CT imaging of the abdomen and pelvis was performed following the standard protocol during bolus administration of intravenous contrast.  Contrast: OMNIPAQUE IOHEXOL 300 MG/ML  SOLN  Comparison: CT scan dated 09/21/2011  Findings: The liver, spleen,  pancreas, left adrenal gland, and right kidney are normal.  There is a stable benign lesion in the right adrenal gland.  Nonspecific 2.3 cm lesion in the inferior medial aspect of the left kidney is unchanged since 08/22/2011. It probably represents an atypical cyst but was not identified on ultrasound exam of 08/22/2011.  It is not visible on prior CT scan of 04/01/2009.  There is no significant adenopathy.  No dilated loops of bowel. Terminal ileum and appendix are normal.  No acute osseous abnormality.  IMPRESSION: No acute abnormalities of the abdomen or pelvis.  Persistent indeterminate low density lesion in the inferior medial aspect of the left kidney.  Follow-up with abdominal MRI with/without contrast would be useful.  Original Report Authenticated By: Gwynn Burly, M.D.   Dg Abd Acute W/chest  11/01/2011  *RADIOLOGY REPORT*  Clinical Data: Abdominal pain with fever, nausea, and vomiting.  ACUTE ABDOMEN SERIES (ABDOMEN 2 VIEW & CHEST 1 VIEW)  Comparison: 07/03 and 10/23/2011  Findings: The patient has chronic interstitial lung disease at the bases.  Heart size and pulmonary vascularity are normal.  No free air or free fluid in the abdomen.  Bowel gas pattern is normal.  Amount of stool in the colon has been reduced since 10/30/2011.  Evidence of prior lower lumbar fusion and cholecystectomy.  IMPRESSION: Benign-appearing abdomen and chest.  Diminished stool in the colon since the prior exam.  Original Report Authenticated By: Gwynn Burly, M.D.   Dg Abd Acute W/chest  10/30/2011  *RADIOLOGY REPORT*  Clinical Data: Abdominal pain, cough, smoker.  ACUTE ABDOMEN SERIES (ABDOMEN 2 VIEW & CHEST 1 VIEW)  Comparison: 10/23/2011  Findings: Peribronchial thickening and diffuse interstitial prominence again noted throughout the lungs compatible with chronic interstitial lung disease.  Heart is normal size.  No effusions.  Prior cholecystectomy.  Large stool burden throughout the colon. No evidence of  bowel obstruction or free air.  No organomegaly or suspicious calcification.  Prior posterior lower lumbar fusion.  IMPRESSION: Large stool burden.  No obstruction or free air.  Chronic interstitial lung disease.  Original Report Authenticated By: Cyndie Chime, M.D.   Dg Abd Acute W/chest  10/23/2011  *RADIOLOGY REPORT*  Clinical Data: Hypertension and diabetes.  Abdominal pain, vomiting and diarrhea.  ACUTE ABDOMEN SERIES (ABDOMEN 2 VIEW & CHEST 1 VIEW)  Comparison: 10/17/2011 and multiple previous  Findings: No evidence of ileus, obstruction or free air.  There is large amount of fecal matter in the colon.  Previous spinal fusion procedure noted.  Previous cholecystectomy clips.  One-view chest shows chronic interstitial lung markings.  No sign of active infiltrate, mass, effusion or collapse.  No free air.  IMPRESSION: Large amount of fecal matter.  No other acute abdominal finding.  Chronic interstitial lung disease.  Original Report Authenticated By: Thomasenia Sales, M.D.   Dg Abd Acute W/chest  10/17/2011  *RADIOLOGY REPORT*  Clinical Data: Lower abdominal pain for several months; vomiting.  ACUTE ABDOMEN SERIES (ABDOMEN 2 VIEW & CHEST 1 VIEW)  Comparison: Chest and abdominal radiographs performed 10/16/2011  Findings: The lungs are well-aerated.  Chronic bronchitic changes are grossly unchanged in appearance, with underlying chronic vascular congestion.  There is no evidence of pleural effusion or pneumothorax.  The cardiomediastinal silhouette is within normal limits.  The visualized bowel gas pattern is nonspecific.  There is mild distension of small bowel loops to 3.3 cm in maximal diameter, without definite evidence of obstruction.  Air and stool are seen within the colon.  No free intra-abdominal air is identified on the provided upright view.  No acute osseous abnormalities are seen; the sacroiliac joints are unremarkable in appearance.  Clips are noted within the right upper quadrant, reflecting  prior cholecystectomy.  Chronic left-sided rib deformity is seen.  Lumbar spinal fusion hardware is noted.  IMPRESSION:  1.  Nonspecific bowel gas pattern; mild distension of small bowel loops, without evidence for obstruction.  No free intra-abdominal air seen. 2.  Chronic bronchitic changes again noted, with underlying chronic vascular congestion.  No acute cardiopulmonary process seen.  Original Report Authenticated By: Tonia Ghent, M.D.   Dg Abd Acute W/chest  10/16/2011  *RADIOLOGY REPORT*  Clinical Data: Left lower quadrant abdominal pain.  Diarrhea.  ACUTE ABDOMEN SERIES (ABDOMEN 2 VIEW & CHEST 1 VIEW)  Comparison: Acute abdominal series 09/21/2011.  Findings: The heart size is normal.  Chronic bronchitic changes are evident bilaterally.  No focal airspace disease is evident.  Supine and upright views of the abdomen demonstrate to air-fluid levels within nondilated bowel.  Postsurgical changes are noted in the lumbar spine.  The patient is status post cholecystectomy.  IMPRESSION:  1.  Fluid levels within nondilated bowel, suggesting an ileus. 2.  Stable chronic bronchitic changes in the lungs bilaterally.  Original Report Authenticated By: Jamesetta Orleans. MATTERN, M.D.    MEDICATIONS: Scheduled Meds:    . ALPRAZolam  1 mg Oral BID  . aspirin EC  81 mg Oral Daily  . DULoxetine  30 mg Oral Daily  . enoxaparin (LOVENOX) injection  40 mg Subcutaneous Q24H  . gabapentin  400 mg Oral TID  . glipiZIDE  10 mg Oral BID WC  . insulin aspart  0-15 Units Subcutaneous TID WC  . insulin glargine  10 Units Subcutaneous QHS  . lactulose  20 g Oral BID  . losartan  100 mg Oral Daily  . metoCLOPramide  5 mg Oral TID AC & HS  . oxyCODONE  40 mg Oral Q12H  . pantoprazole  40 mg Oral Q1200  . senna-docusate  1 tablet Oral Daily  . sucralfate  1 g Oral TID WC & HS  . DISCONTD: metoCLOPramide (REGLAN) injection  5 mg Intravenous TID AC & HS  . DISCONTD: pantoprazole (PROTONIX) IV  40 mg Intravenous Q24H     Continuous Infusions:    . DISCONTD: sodium chloride 100 mL/hr at 11/01/11 1245   PRN Meds:.acetaminophen, acetaminophen, albuterol, bisacodyl, ondansetron (ZOFRAN) IV, ondansetron, oxycodone, DISCONTD:  HYDROmorphone (DILAUDID) injection, DISCONTD: oxycodone  Antibiotics: Anti-infectives    None      Assessment/Plan: Principal Problem:  *Abdominal pain -likely related to chronic pancreatitis/Gastritis -change PPI to oral -c/w Carafate -stop Dilaudid, will try to transition to long  acting Oxycontin and use oxycodone for pain  Vomiting -resolved -c/w oral reglan l -has had extennsive w/u including EGD, Gastric emptying study and numerous CT scans of Abd over the past several months. Abd Xray neg for Bowel obstruction-do not plan on any further work up this admission-unless clinical situation changes  Frequent Falls -claims to have had frequent falls at home -CT head 7/5 negative -appreciate PT eval  Chronic Pain syndrome  -at baseline -start Long acting oxycontin and use oxycodone for breakthrough pain  Constipation -both RN and patient confirm numerous BM's -continue current bowel regimen -Xray Abdomen on 7/5 showed decreased stool burden as compared to before  .LOW BACK PAIN/neuropathy  -Continue as needed narcotics, continue Neurontin  -c/w Cymbalta   .DIABETES MELLITUS, TYPE II  -Continue Lantus and glipizide, hold metformin while inpatient -C/w SSI  .GERD  -Continue PPI and carafate  . HTN -moderate control with Losartan -monitor BP trend and add another agent if needed  Disposition: -remain inpatient-get PT/OT and social worker to evaluate  DVT Prophylaxis: -prophylactic Lovenox  Code Status: Full Code  Jeoffrey Massed, MD  [facility] Pager [phone]  If 7PM-7AM, please contact night-coverage [email or web address] Password [email or web address] 11/03/2011, 9:17 AM   LOS: 2 days

## 2011-11-03 NOTE — Progress Notes (Signed)
Clinical Social Work Department BRIEF PSYCHOSOCIAL ASSESSMENT 11/03/2011  Patient:  Ryan [name],Ryan [name]     Account Number:  [id number]     Admit date:  11/01/2011  Clinical Social Worker:  Skip Mayer  Date/Time:  11/02/2011 01:00 PM Visit and note transcribed by Lia Foyer, LCSWA.  Referred by:  Physician  Date Referred:  11/01/2011 Referred for  SNF Placement   Other Referral:   Interview type:  Patient Other interview type:    PSYCHOSOCIAL DATA Living Status:  ALONE Admitted from facility:   Level of care:   Primary support name:  Cleda Daub Primary support relationship to patient:  PARENT Degree of support available:   limited per patient    CURRENT CONCERNS Current Concerns  Post-Acute Placement   Other Concerns:    SOCIAL WORK ASSESSMENT / PLAN CSW met with patient per referral. Patient was agreeable to SNF placement. Patient requested [facility] as his number one choice. CSW encouraged the patient to open his search to [facility] as [name] back up plan, and patient agreed. Weekday CSW will follow up with bed offers.   Assessment/plan status:   Other assessment/ plan:   Information/referral to community resources:    PATIENT'S/FAMILY'S RESPONSE TO PLAN OF CARE: Patient reports he is agreeable to SNF search. Patient thanked CSW for support.    Lia Foyer, LCSWA [facility] Clinical Social Worker Contact #: [phone] (weekend)

## 2011-11-04 ENCOUNTER — Ambulatory Visit: Payer: Medicare Other | Admitting: Internal Medicine

## 2011-11-04 LAB — GLUCOSE, CAPILLARY

## 2011-11-04 MED ORDER — ALBUTEROL SULFATE (5 MG/ML) 0.5% IN NEBU: 2.5000 mg | INHALATION_SOLUTION | RESPIRATORY_TRACT | Status: DC | PRN

## 2011-11-04 MED ORDER — ALPRAZOLAM 1 MG PO TABS: 1.0000 mg | ORAL_TABLET | Freq: Two times a day (BID) | ORAL | Status: DC

## 2011-11-04 MED ORDER — INSULIN ASPART 100 UNIT/ML ~~LOC~~ SOLN: 0.0000 [IU] | Freq: Three times a day (TID) | SUBCUTANEOUS | Status: DC

## 2011-11-04 MED ORDER — DULOXETINE HCL 30 MG PO CPEP: 30.0000 mg | ORAL_CAPSULE | Freq: Every day | ORAL | Status: DC

## 2011-11-04 MED ORDER — OXYCODONE HCL 15 MG PO TABS: 15.0000 mg | ORAL_TABLET | ORAL | Status: AC | PRN

## 2011-11-04 MED ORDER — OXYCODONE HCL 20 MG PO TB12
50.0000 mg | ORAL_TABLET | Freq: Two times a day (BID) | ORAL | Status: DC
  Administered 2011-11-04: 50 mg via ORAL
  Filled 2011-11-04 (×2): qty 2

## 2011-11-04 MED ORDER — ACETAMINOPHEN 325 MG PO TABS: 650.0000 mg | ORAL_TABLET | Freq: Four times a day (QID) | ORAL | Status: DC | PRN

## 2011-11-04 MED ORDER — OXYCODONE HCL 60 MG PO TB12: 60.0000 mg | ORAL_TABLET | Freq: Two times a day (BID) | ORAL | Status: DC

## 2011-11-04 NOTE — ED Provider Notes (Signed)
Medical screening examination/treatment/procedure(s) were performed by non-physician practitioner and as supervising physician I was immediately available for consultation/collaboration.  Jesenya Bowditch, MD 11/04/11 0728 

## 2011-11-04 NOTE — Progress Notes (Signed)
Carmin Richmond to be D/C'd to [facility] per MD order.  Discussed with the patient and all questions fully answered.   [place], [place]  Home Medication Instructions ZOX:[id number]   Printed on:11/04/11 1537  Medication Information                    aspirin EC 81 MG tablet Take 81 mg by mouth daily.           metFORMIN (GLUCOPHAGE) 850 MG tablet Take 850 mg by mouth 3 (three) times daily.           sennosides-docusate sodium (SENOKOT-S) 8.6-50 MG tablet Take 1 tablet by mouth 2 (two) times daily as needed. For constipation           insulin glargine (LANTUS) 100 UNIT/ML injection Inject 10 Units into the skin at bedtime.           gabapentin (NEURONTIN) 400 MG capsule Take 400 mg by mouth 3 (three) times daily.           glipiZIDE (GLUCOTROL XL) 10 MG 24 hr tablet Take 10 mg by mouth 2 (two) times daily.           losartan (COZAAR) 100 MG tablet Take 100 mg by mouth daily.           lactulose (CHRONULAC) 10 GM/15ML solution Take 20 g by mouth 2 (two) times daily.           pantoprazole (PROTONIX) 40 MG tablet Take 40 mg by mouth daily.           metoCLOPramide (REGLAN) 10 MG tablet Take 5 mg by mouth 4 (four) times daily. Before meals and at bedtime           ondansetron (ZOFRAN) 4 MG tablet Take 4 mg by mouth every 6 (six) hours as needed. For nausea           promethazine (PHENERGAN) 25 MG tablet Take 1 tablet (25 mg total) by mouth every 6 (six) hours as needed for nausea.           promethazine (PHENERGAN) 25 MG suppository Place 1 suppository (25 mg total) rectally every 6 (six) hours as needed for nausea.           acetaminophen (TYLENOL) 325 MG tablet Take 2 tablets (650 mg total) by mouth every 6 (six) hours as needed (or Fever >/= 101).           albuterol (PROVENTIL) (5 MG/ML) 0.5% nebulizer solution Take 0.5 mLs (2.5 mg total) by nebulization every 2 (two) hours as needed for wheezing.           DULoxetine (CYMBALTA) 30 MG capsule Take 1  capsule (30 mg total) by mouth daily.           insulin aspart (NOVOLOG) 100 UNIT/ML injection Inject 0-15 Units into the skin 3 (three) times daily with meals.           oxyCODONE (ROXICODONE) 15 MG immediate release tablet Take 1 tablet (15 mg total) by mouth every 4 (four) hours as needed.           ALPRAZolam (XANAX) 1 MG tablet Take 1 tablet (1 mg total) by mouth 2 (two) times daily.           Oxycodone HCl (OXYCONTIN) 60 MG TB12 Take 1 tablet (60 mg total) by mouth every 12 (twelve) hours.             VVS,  Skin clean, dry and intact without evidence of skin break down, no evidence of skin tears noted. IV catheter discontinued intact. Site without signs and symptoms of complications. Dressing and pressure applied.  An After Visit Summary was printed and given to the patient. Patient escorted via strecher, and D/C to SNF via [facility].  Kennyth Arnold D 11/04/2011 3:37 PM

## 2011-11-04 NOTE — Progress Notes (Signed)
Physical Therapy Treatment Patient Details Name: Ryan [name] MRN: [id number] DOB: 03/02/1951 Today's Date: 11/04/2011 Time: 1152-1204 PT Time Calculation (min): 12 min  PT Assessment / Plan / Recommendation Comments on Treatment Session  Pt adm with abd pain and reports frequent falls at home.  Pt with improving mobility.  ST-SNF should allow pt to regain enough strength  and independence to return home alone eventually.    Follow Up Recommendations  Skilled nursing facility    Barriers to Discharge        Equipment Recommendations  Defer to next venue    Recommendations for Other Services    Frequency Min 2X/week   Plan Discharge plan remains appropriate;Frequency remains appropriate    Precautions / Restrictions Precautions Precautions: Fall   Pertinent Vitals/Pain N/A    Mobility  Transfers Sit to Stand: 5: Supervision;With upper extremity assist;With armrests;From chair/3-in-1 Stand to Sit: 5: Supervision;With upper extremity assist;To bed Ambulation/Gait Ambulation/Gait Assistance: 4: Min guard Ambulation Distance (Feet): 100 Feet Assistive device: Rolling walker Ambulation/Gait Assistance Details: Verbal cues to stand more upright and to stay closer to walker Gait Pattern: Wide base of support;Trunk flexed Gait velocity: slow cadence    Exercises     PT Diagnosis:    PT Problem List:   PT Treatment Interventions:     PT Goals Acute Rehab PT Goals PT Goal: Sit to Stand - Progress: Progressing toward goal PT Goal: Stand to Sit - Progress: Progressing toward goal PT Goal: Ambulate - Progress: Progressing toward goal  Visit Information  Last PT Received On: 11/04/11 Assistance Needed: +1    Subjective Data  Subjective: Pt states he wants to focus on getting better and stronger.   Cognition  Overall Cognitive Status: Appears within functional limits for tasks assessed/performed Arousal/Alertness: Awake/alert Orientation Level: Appears intact for tasks  assessed Behavior During Session: [facility] for tasks performed    Balance  Static Standing Balance Static Standing - Balance Support: Bilateral upper extremity supported (on walker) Static Standing - Level of Assistance: 5: Stand by assistance  End of Session     GP     [facility] 11/04/2011, 1:43 PM  [facility] PT [phone]

## 2011-11-04 NOTE — Discharge Summary (Addendum)
PATIENT DETAILS Name: Ryan [name] Age: 61 y.o. Sex: male Date of Birth: 08-05-1950 MRN: [id number]. Admit Date: 11/01/2011 Admitting Physician: Maretta Bees, MD WUJ:[id number],NFAOZH, MD  PRIMARY DISCHARGE DIAGNOSIS:  Principal Problem:  *Abdominal pain Active Problems:  DIABETES MELLITUS, TYPE II  GERD  LOW BACK PAIN  Chronic pancreatitis  Constipation due to pain medication  Narcotic dependence      PAST MEDICAL HISTORY: Past Medical History  Diagnosis Date  . Hypertension   . Diabetes mellitus   . DVT (deep venous thrombosis)   . Chronic back pain   . 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 Hyperlipidemia   . CAD (coronary atherosclerotic disease)     s/p stents x2  . Small bowel obstruction   . Pancreatitis     DISCHARGE MEDICATIONS: Medication List  As of 11/04/2011  9:47 AM   STOP taking these medications         oxyCODONE-acetaminophen 5-325 MG per tablet         TAKE these medications         acetaminophen 325 MG tablet   Commonly known as: TYLENOL   Take 2 tablets (650 mg total) by mouth every 6 (six) hours as needed (or Fever >/= 101).      albuterol (5 MG/ML) 0.5% nebulizer solution   Commonly known as: PROVENTIL   Take 0.5 mLs (2.5 mg total) by nebulization every 2 (two) hours as needed for wheezing.      ALPRAZolam 1 MG tablet   Commonly known as: XANAX   Take 1 tablet (1 mg total) by mouth 2 (two) times daily.      aspirin EC 81 MG tablet   Take 81 mg by mouth daily.      DULoxetine 30 MG capsule   Commonly known as: CYMBALTA   Take 1 capsule (30 mg total) by mouth daily.      gabapentin 400 MG capsule   Commonly known as: NEURONTIN   Take 400 mg by mouth 3 (three) times daily.      glipiZIDE 10 MG 24 hr tablet   Commonly known as: GLUCOTROL XL   Take 10 mg by mouth 2 (two) times daily.      insulin aspart 100 UNIT/ML injection   Commonly known as: novoLOG   Inject 0-15 Units into the skin 3 (three)  times daily with meals.      insulin glargine 100 UNIT/ML injection   Commonly known as: LANTUS   Inject 10 Units into the skin at bedtime.      lactulose 10 GM/15ML solution   Commonly known as: CHRONULAC   Take 20 g by mouth 2 (two) times daily.      losartan 100 MG tablet   Commonly known as: COZAAR   Take 100 mg by mouth daily.      metFORMIN 850 MG tablet   Commonly known as: GLUCOPHAGE   Take 850 mg by mouth 3 (three) times daily.      metoCLOPramide 10 MG tablet   Commonly known as: REGLAN   Take 5 mg by mouth 4 (four) times daily. Before meals and at bedtime      ondansetron 4 MG tablet   Commonly known as: ZOFRAN   Take 4 mg by mouth every 6 (six) hours as needed. For nausea      oxyCODONE 15 MG immediate release tablet   Commonly known as: ROXICODONE   Take 1 tablet (15  mg total) by mouth every 4 (four) hours as needed.      Oxycodone HCl 60 MG Tb12   Take 1 tablet (60 mg total) by mouth every 12 (twelve) hours.      pantoprazole 40 MG tablet   Commonly known as: PROTONIX   Take 40 mg by mouth daily.      promethazine 25 MG tablet   Commonly known as: PHENERGAN   Take 1 tablet (25 mg total) by mouth every 6 (six) hours as needed for nausea.      promethazine 25 MG suppository   Commonly known as: PHENERGAN   Place 1 suppository (25 mg total) rectally every 6 (six) hours as needed for nausea.      sennosides-docusate sodium 8.6-50 MG tablet   Commonly known as: SENOKOT-S   Take 1 tablet by mouth 2 (two) times daily as needed. For constipation             BRIEF HPI:  See H&P, Labs, Consult and Test reports for all details in brief, patient was admitted for acute on chronic abdominal pain associated with vomiting.  CONSULTATIONS:   None  PERTINENT RADIOLOGIC STUDIES: Ct Head Wo Contrast  11/01/2011  *RADIOLOGY REPORT*  Clinical Data: Frequent falling  CT HEAD WITHOUT CONTRAST  Technique:  Contiguous axial images were obtained from the base of the  skull through the vertex without contrast.  Comparison: 08/10/2009  Findings: The brain does not show any evidence of malformation, atrophy, old or acute infarction, mass lesion, hemorrhage, hydrocephalus or extra-axial collection.  Incidental cavum septum pellucidum.  Middle ears and mastoids are clear.  Sinuses are clear except for some opacification in the left ethmoid and sphenoid region.  IMPRESSION: Normal appearance of the brain.  Some fluid in the left ethmoid and sphenoid sinuses.  Original Report Authenticated By: Thomasenia Sales, M.D.   Nm Gastric Emptying  10/21/2011  *RADIOLOGY REPORT*  Clinical Data:  Nausea and vomiting  NUCLEAR MEDICINE GASTRIC EMPTYING SCAN  Technique:  After oral ingestion of radiolabeled meal, sequential abdominal images were obtained for 120 minutes.  Residual percentage of activity remaining within the stomach was calculated at 60 and 120 minutes.  Radiopharmaceutical:  2.0  mCi Tc-4m sulfur colloid.  Comparison:  10/19/2011  Findings: After 60 minutes there is 68% radiotracer activity remaining in the stomach.  After 120 minutes there is a 4% radiotracer activity remaining in the stomach.  IMPRESSION:  1.  Normal gastric emptying.  Original Report Authenticated By: Rosealee Albee, M.D.   Ct Abdomen Pelvis W Contrast  10/19/2011  *RADIOLOGY REPORT*  Clinical Data: Diffuse abdominal pain, nausea, vomiting and diarrhea.  CT ABDOMEN AND PELVIS WITH CONTRAST  Technique:  Multidetector CT imaging of the abdomen and pelvis was performed following the standard protocol during bolus administration of intravenous contrast.  Contrast: OMNIPAQUE IOHEXOL 300 MG/ML  SOLN  Comparison: CT of the abdomen and pelvis performed 10/11/2011  Findings: Minimal bibasilar atelectasis is noted.  Prominence of the intrahepatic biliary ducts likely reflects prior cholecystectomy, with clips noted at the gallbladder fossa.  The liver is otherwise unremarkable in appearance.  The spleen is  within normal limits.  The pancreas is grossly unremarkable.  A 2.2 cm right adrenal adenoma is again noted.  The left adrenal gland is unremarkable in appearance.  A vague 2.5 cm hypodense lesion within the medial left kidney remains nondescript, first characterized around 2011.  It remains relatively stable in size, but malignancy cannot be entirely  excluded.  A smaller 1.0 cm hypodensity is noted at the interpole region of the left kidney.  MRI could be considered for further evaluation, as previously recommended.  No free fluid is identified.  The small bowel is unremarkable in appearance.  The stomach is within normal limits.  No acute vascular abnormalities are seen.  Scattered calcification is noted along the distal abdominal aorta and its branches.  The appendix is normal in caliber and contains air, without evidence for appendicitis.  The colon is unremarkable in appearance.  The bladder is mildly distended and grossly unremarkable in appearance.  The prostate remains borderline normal in size, with scattered calcification.  No inguinal lymphadenopathy is seen.  No acute osseous abnormalities are identified.  The patient is status post posterior lumbar spinal fusion at L4-L5, with associated decompression.  IMPRESSION:  1.  No acute abnormalities seen within the abdomen or pelvis. 2.  2.5 cm left renal lesion is nonspecific, first characterized around 2011.  It remains relatively stable in size, but malignancy cannot be excluded.  Smaller 1.0 cm renal hypodensity also seen. MRI with and without contrast would be helpful for further evaluation, when and as deemed clinically appropriate. 3.  2.2 cm right adrenal adenoma seen. 4.  Scattered calcification along the distal abdominal aorta and its branches.  Original Report Authenticated By: Tonia Ghent, M.D.   Ct Abdomen Pelvis W Contrast  10/11/2011  *RADIOLOGY REPORT*  Clinical Data: Persistent vomiting.  Abdominal pain.  CT ABDOMEN AND PELVIS WITH  CONTRAST  Technique:  Multidetector CT imaging of the abdomen and pelvis was performed following the standard protocol during bolus administration of intravenous contrast.  Contrast: OMNIPAQUE IOHEXOL 300 MG/ML  SOLN  Comparison: CT scan dated 09/21/2011  Findings: The liver, spleen, pancreas, left adrenal gland, and right kidney are normal.  There is a stable benign lesion in the right adrenal gland.  Nonspecific 2.3 cm lesion in the inferior medial aspect of the left kidney is unchanged since 08/22/2011. It probably represents an atypical cyst but was not identified on ultrasound exam of 08/22/2011.  It is not visible on prior CT scan of 04/01/2009.  There is no significant adenopathy.  No dilated loops of bowel. Terminal ileum and appendix are normal.  No acute osseous abnormality.  IMPRESSION: No acute abnormalities of the abdomen or pelvis.  Persistent indeterminate low density lesion in the inferior medial aspect of the left kidney.  Follow-up with abdominal MRI with/without contrast would be useful.  Original Report Authenticated By: Gwynn Burly, M.D.   Dg Abd Acute W/chest  11/01/2011  *RADIOLOGY REPORT*  Clinical Data: Abdominal pain with fever, nausea, and vomiting.  ACUTE ABDOMEN SERIES (ABDOMEN 2 VIEW & CHEST 1 VIEW)  Comparison: 07/03 and 10/23/2011  Findings: The patient has chronic interstitial lung disease at the bases.  Heart size and pulmonary vascularity are normal.  No free air or free fluid in the abdomen.  Bowel gas pattern is normal.  Amount of stool in the colon has been reduced since 10/30/2011.  Evidence of prior lower lumbar fusion and cholecystectomy.  IMPRESSION: Benign-appearing abdomen and chest.  Diminished stool in the colon since the prior exam.  Original Report Authenticated By: Gwynn Burly, M.D.   Dg Abd Acute W/chest  10/30/2011  *RADIOLOGY REPORT*  Clinical Data: Abdominal pain, cough, smoker.  ACUTE ABDOMEN SERIES (ABDOMEN 2 VIEW & CHEST 1 VIEW)  Comparison:  10/23/2011  Findings: Peribronchial thickening and diffuse interstitial prominence again noted throughout the lungs  compatible with chronic interstitial lung disease.  Heart is normal size.  No effusions.  Prior cholecystectomy.  Large stool burden throughout the colon. No evidence of bowel obstruction or free air.  No organomegaly or suspicious calcification.  Prior posterior lower lumbar fusion.  IMPRESSION: Large stool burden.  No obstruction or free air.  Chronic interstitial lung disease.  Original Report Authenticated By: Cyndie Chime, M.D.   Dg Abd Acute W/chest  10/23/2011  *RADIOLOGY REPORT*  Clinical Data: Hypertension and diabetes.  Abdominal pain, vomiting and diarrhea.  ACUTE ABDOMEN SERIES (ABDOMEN 2 VIEW & CHEST 1 VIEW)  Comparison: 10/17/2011 and multiple previous  Findings: No evidence of ileus, obstruction or free air.  There is large amount of fecal matter in the colon.  Previous spinal fusion procedure noted.  Previous cholecystectomy clips.  One-view chest shows chronic interstitial lung markings.  No sign of active infiltrate, mass, effusion or collapse.  No free air.  IMPRESSION: Large amount of fecal matter.  No other acute abdominal finding.  Chronic interstitial lung disease.  Original Report Authenticated By: Thomasenia Sales, M.D.   Dg Abd Acute W/chest  10/17/2011  *RADIOLOGY REPORT*  Clinical Data: Lower abdominal pain for several months; vomiting.  ACUTE ABDOMEN SERIES (ABDOMEN 2 VIEW & CHEST 1 VIEW)  Comparison: Chest and abdominal radiographs performed 10/16/2011  Findings: The lungs are well-aerated.  Chronic bronchitic changes are grossly unchanged in appearance, with underlying chronic vascular congestion.  There is no evidence of pleural effusion or pneumothorax.  The cardiomediastinal silhouette is within normal limits.  The visualized bowel gas pattern is nonspecific.  There is mild distension of small bowel loops to 3.3 cm in maximal diameter, without definite evidence  of obstruction.  Air and stool are seen within the colon.  No free intra-abdominal air is identified on the provided upright view.  No acute osseous abnormalities are seen; the sacroiliac joints are unremarkable in appearance.  Clips are noted within the right upper quadrant, reflecting prior cholecystectomy.  Chronic left-sided rib deformity is seen.  Lumbar spinal fusion hardware is noted.  IMPRESSION:  1.  Nonspecific bowel gas pattern; mild distension of small bowel loops, without evidence for obstruction.  No free intra-abdominal air seen. 2.  Chronic bronchitic changes again noted, with underlying chronic vascular congestion.  No acute cardiopulmonary process seen.  Original Report Authenticated By: Tonia Ghent, M.D.   Dg Abd Acute W/chest  10/16/2011  *RADIOLOGY REPORT*  Clinical Data: Left lower quadrant abdominal pain.  Diarrhea.  ACUTE ABDOMEN SERIES (ABDOMEN 2 VIEW & CHEST 1 VIEW)  Comparison: Acute abdominal series 09/21/2011.  Findings: The heart size is normal.  Chronic bronchitic changes are evident bilaterally.  No focal airspace disease is evident.  Supine and upright views of the abdomen demonstrate to air-fluid levels within nondilated bowel.  Postsurgical changes are noted in the lumbar spine.  The patient is status post cholecystectomy.  IMPRESSION:  1.  Fluid levels within nondilated bowel, suggesting an ileus. 2.  Stable chronic bronchitic changes in the lungs bilaterally.  Original Report Authenticated By: Jamesetta Orleans. MATTERN, M.D.     PERTINENT LAB RESULTS: CBC:  Basename 11/02/11 0610 11/01/11 1300  WBC 4.8 6.2  HGB 12.6* 13.0  HCT 38.6* 39.7  PLT 205 196   CMET CMP     Component Value Date/Time   NA 141 11/02/2011 0610   K 4.3 11/02/2011 0610   CL 107 11/02/2011 0610   CO2 26 11/02/2011 0610   GLUCOSE 135* 11/02/2011  0610   BUN 5* 11/02/2011 0610   CREATININE 0.62 11/02/2011 0610   CALCIUM 9.4 11/02/2011 0610   PROT 6.5 11/02/2011 0610   ALBUMIN 3.1* 11/02/2011 0610   AST 9  11/02/2011 0610   ALT 5 11/02/2011 0610   ALKPHOS 68 11/02/2011 0610   BILITOT 0.1* 11/02/2011 0610   GFRNONAA >90 11/02/2011 0610   GFRAA >90 11/02/2011 0610    GFR Estimated Creatinine Clearance: 153.3 ml/min (by C-G formula based on Cr of 0.62). No results found for this basename: LIPASE:2,AMYLASE:2 in the last 72 hours No results found for this basename: CKTOTAL:3,CKMB:3,CKMBINDEX:3,TROPONINI:3 in the last 72 hours No components found with this basename: POCBNP:3 No results found for this basename: DDIMER:2 in the last 72 hours No results found for this basename: HGBA1C:2 in the last 72 hours No results found for this basename: CHOL:2,HDL:2,LDLCALC:2,TRIG:2,CHOLHDL:2,LDLDIRECT:2 in the last 72 hours No results found for this basename: TSH,T4TOTAL,FREET3,T3FREE,THYROIDAB in the last 72 hours No results found for this basename: VITAMINB12:2,FOLATE:2,FERRITIN:2,TIBC:2,IRON:2,RETICCTPCT:2 in the last 72 hours Coags:  Basename 11/02/11 0610  INR 0.95   Microbiology: Recent Results (from the past 240 hour(s))  MRSA PCR SCREENING     Status: Normal   Collection Time   11/01/11 11:40 AM      Component Value Range Status Comment   MRSA by PCR NEGATIVE  NEGATIVE Final      BRIEF HOSPITAL COURSE:   Principal Problem:  *Abdominal pain -acute on chronic -has had extensive w/u in past-including numerous CT scans' EGD, Gastric emptying scan  -last EGD on 5/26 showed a ulcerated mass in the distal esophagus along with erosive esophagitis. Biopsy showed:- NO EVIDENCE OF INTESTINAL METAPLASIA, DYSPLASIA OR MALIGNANCY. -on admission he was complaining of acute on chronic abd pain associated with vomiting, he was admitted and given supportive care. His diet was slowly advanced, he has now tolerated a regular diet, IV narcotics have been discontinued. He has been transitioned to oral narcotics as noted in the D/C summary. Further optimization will need to be done by the attending MD at the SNF  Vomiting    -resolved  -c/w oral reglan  -has had extennsive w/u including EGD, Gastric emptying study and numerous CT scans of Abd over the past several months. Abd Xray neg for Bowel obstruction-do not plan on any further work up this admission-unless clinical situation changes  Frequent Falls  -claims to have had frequent falls at home  -CT head 7/5 negative -PT did evaluate-recommending SNF  Chronic Pain syndrome  -at baseline-is on chronic narcotics at home -started Long acting oxycontin and use oxycodone for breakthrough pain  Constipation  -both RN and patient confirm numerous BM's  -continue current bowel regimen  -Xray Abdomen on 7/5 showed decreased stool burden as compared to before   LOW BACK PAIN/neuropathy -chronic issue  -Continue as needed narcotics, continue Neurontin  -c/w Cymbalta   .DIABETES MELLITUS, TYPE II  -Continue Lantus and glipizide, and metformin -C/w SSI  .GERD  -Continue PPI   HTN  -moderate control with Losartan  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  TODAY-DAY OF DISCHARGE:  Subjective:   Maki Sweetser today has no headache,no chest,no new weakness tingling or numbness, feels much better wants and is anxious to be discharged to SNF today.   Objective:   Blood pressure 155/93, pulse 65, temperature 98.1 F (36.7 C), temperature source Oral, resp. rate 18, height 6\' 6"  (1.981  m), weight 138.846 kg (306 lb 1.6 oz), SpO2 96.00%.  Intake/Output Summary (Last 24 hours) at 11/04/11 0947 Last data filed at 11/04/11 0900  Gross per 24 hour  Intake    840 ml  Output   1000 ml  Net   -160 ml    Exam Awake Alert, Oriented *3, No new F.N deficits, Normal affect [place].AT,PERRAL Supple Neck,No JVD, No cervical lymphadenopathy appriciated.  Symmetrical Chest wall movement, Good air movement bilaterally, CTAB RRR,No Gallops,Rubs or new Murmurs, No Parasternal Heave +ve  B.Sounds, Abd Soft, Non tender, No organomegaly appriciated, No rebound -guarding or rigidity. No Cyanosis, Clubbing or edema, No new Rash or bruise  DISPOSITION: SNF  DISCHARGE INSTRUCTIONS:    Follow-up Information    Follow up with Warrick Parisian, MD. Schedule an appointment as soon as possible for a visit in 2 weeks. (after discharge from SNF)    Contact information:   [address] [place] [address] [phone]         Total Time spent on discharge equals 45 minutes.  SignedJeoffrey Massed 11/04/2011 9:47 AM

## 2011-11-04 NOTE — Progress Notes (Signed)
OT Note: OT screen complete due to pt. Pending D/C today and will defer OT needs to SNF. Will sign off acutely. Thanks!  Cassandria Anger, OTR/L Pager: [phone] 11/04/2011 .

## 2011-11-04 NOTE — Progress Notes (Signed)
Clinical Social Worker facilitated pt discharge needs including contacting facility, discussing with pt at bedside who reported that he would contact pt family, faxing pt discharge information via [facility], providing phone number to RN to call report, and arranging ambulance transportation for pt to [facility]. No further social work needs identified at this time. Clinical Social Worker signing off.  Jacklynn Lewis, MSW, LCSWA  Clinical Social Work [phone]

## 2011-11-04 NOTE — Progress Notes (Signed)
Clinical Social Worker met with pt at bedside to provide bed offers. Pt chooses bed at [facility]. Clinical Social Worker notified facility and facility able to accept pt today. Facility requested results from chest xray and for MD to document that pt has no acute infection due to pt hx of TB. MD notified and documents provided to facility. Clinical Child psychotherapist discussed with pt about transfer to facility today and provided psychosocial support as pt discussed "worry" about rehab at [facility]. Clinical Social Worker to facilitate pt discharge needs this afternoon.  Jacklynn Lewis, MSW, LCSWA  Clinical Social Work [phone]

## 2011-11-17 DIAGNOSIS — E119 Type 2 diabetes mellitus without complications: Secondary | ICD-10-CM | POA: Insufficient documentation

## 2011-11-17 DIAGNOSIS — I10 Essential (primary) hypertension: Secondary | ICD-10-CM | POA: Insufficient documentation

## 2011-11-17 DIAGNOSIS — R5381 Other malaise: Secondary | ICD-10-CM | POA: Insufficient documentation

## 2011-11-17 DIAGNOSIS — G589 Mononeuropathy, unspecified: Secondary | ICD-10-CM | POA: Insufficient documentation

## 2011-11-18 ENCOUNTER — Encounter (HOSPITAL_COMMUNITY): Payer: Self-pay | Admitting: *Deleted

## 2011-11-18 ENCOUNTER — Emergency Department (HOSPITAL_COMMUNITY): Payer: Medicare Other

## 2011-11-18 ENCOUNTER — Emergency Department (HOSPITAL_COMMUNITY)
Admission: EM | Admit: 2011-11-18 | Discharge: 2011-11-18 | Disposition: A | Discharge status: Home / Self Care | Payer: Medicare Other | Attending: Emergency Medicine | Admitting: Emergency Medicine

## 2011-11-18 DIAGNOSIS — I2510 Atherosclerotic heart disease of native coronary artery without angina pectoris: Secondary | ICD-10-CM | POA: Insufficient documentation

## 2011-11-18 DIAGNOSIS — M25571 Pain in right ankle and joints of right foot: Secondary | ICD-10-CM

## 2011-11-18 DIAGNOSIS — I10 Essential (primary) hypertension: Secondary | ICD-10-CM | POA: Insufficient documentation

## 2011-11-18 DIAGNOSIS — Z794 Long term (current) use of insulin: Secondary | ICD-10-CM | POA: Insufficient documentation

## 2011-11-18 DIAGNOSIS — M25579 Pain in unspecified ankle and joints of unspecified foot: Secondary | ICD-10-CM | POA: Insufficient documentation

## 2011-11-18 DIAGNOSIS — E119 Type 2 diabetes mellitus without complications: Secondary | ICD-10-CM | POA: Insufficient documentation

## 2011-11-18 DIAGNOSIS — F17200 Nicotine dependence, unspecified, uncomplicated: Secondary | ICD-10-CM | POA: Insufficient documentation

## 2011-11-18 DIAGNOSIS — Z86718 Personal history of other venous thrombosis and embolism: Secondary | ICD-10-CM | POA: Insufficient documentation

## 2011-11-18 DIAGNOSIS — Z79899 Other long term (current) drug therapy: Secondary | ICD-10-CM | POA: Insufficient documentation

## 2011-11-18 DIAGNOSIS — E785 Hyperlipidemia, unspecified: Secondary | ICD-10-CM | POA: Insufficient documentation

## 2011-11-18 MED ORDER — OXYCODONE-ACETAMINOPHEN 5-325 MG PO TABS
1.0000 | ORAL_TABLET | Freq: Once | ORAL | Status: AC
  Administered 2011-11-18: 1 via ORAL
  Filled 2011-11-18: qty 1

## 2011-11-18 NOTE — ED Notes (Signed)
Pt sts he was loading things into his car yesterday and his leg twisted and he fell. Pt reports he can't stand or walk due to pain. "I can't move this left foot, it's excrutiating pain." Also reports abrasion and pain to L elbow.

## 2011-11-18 NOTE — ED Notes (Signed)
Pt verbalizes understanding 

## 2011-11-18 NOTE — ED Provider Notes (Signed)
History     CSN: [id number]  Arrival date & time 11/18/11  1811   First MD Initiated Contact with Patient 11/18/11 2003      Chief Complaint  Patient presents with  . Fall  . Leg Pain    (Consider location/radiation/quality/duration/timing/severity/associated sxs/prior treatment) HPI History from patient. 61 year old male who presents after a fall yesterday. He reports that he was loading things into his car when his left foot twisted and he fell He reports pain to his bilateral ankles. Reports that he is unable to weight-bear due to the pain. Pain is throbbing and located to the bilateral lateral ankles. No treatment prior to coming here. He denies numbness.  Past Medical History  Diagnosis Date  . Hypertension   . Diabetes mellitus   . DVT (deep venous thrombosis)   . Chronic back pain   . 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 Hyperlipidemia   . CAD (coronary atherosclerotic disease)     s/p stents x2  . Small bowel obstruction   . Pancreatitis     Past Surgical History  Procedure Date  . Heart stent   . Tonsillectomy and adenoidectomy   . Cholecystectomy   . Back surgery   . Esophagogastroduodenoscopy 09/22/2011    Procedure: ESOPHAGOGASTRODUODENOSCOPY (EGD);  Surgeon: Charna Elizabeth, MD;  Location: [facility] ENDOSCOPY;  Service: Endoscopy;  Laterality: N/A;    Family History  Problem Relation Age of Onset  . Diabetes Father     History  Substance Use Topics  . Smoking status: Current Some Day Smoker -- 0.2 packs/day for 30 years    Types: Cigarettes  . Smokeless tobacco: Never Used  . Alcohol Use: No      Review of Systems  Constitutional: Negative.   Musculoskeletal: Positive for arthralgias and gait problem.  Skin: Negative for rash and wound.  Neurological: Negative for numbness.    Allergies  Lisinopril and Morphine and related  Home Medications   Current Outpatient Rx  Name Route Sig Dispense Refill  . ACETAMINOPHEN 325 MG  PO TABS Oral Take 650 mg by mouth every 6 (six) hours as needed.    . ALBUTEROL SULFATE (5 MG/ML) 0.5% IN NEBU Nebulization Take 0.5 mLs (2.5 mg total) by nebulization every 2 (two) hours as needed for wheezing. 20 mL   . ALPRAZOLAM 1 MG PO TABS Oral Take 1 tablet (1 mg total) by mouth 2 (two) times daily. 10 tablet 0  . ASPIRIN EC 81 MG PO TBEC Oral Take 81 mg by mouth daily.    . DULOXETINE HCL 30 MG PO CPEP Oral Take 1 capsule (30 mg total) by mouth daily.    Marland Kitchen GABAPENTIN 400 MG PO CAPS Oral Take 400 mg by mouth 3 (three) times daily.    Marland Kitchen GLIPIZIDE ER 10 MG PO TB24 Oral Take 10 mg by mouth 2 (two) times daily.    . INSULIN ASPART 100 UNIT/ML [place] SOLN Subcutaneous Inject 0-15 Units into the skin 3 (three) times daily with meals. 1 vial   . INSULIN GLARGINE 100 UNIT/ML [place] SOLN Subcutaneous Inject 10 Units into the skin at bedtime.    Marland Kitchen LACTULOSE 10 GM/15ML PO SOLN Oral Take 20 g by mouth 2 (two) times daily.    Marland Kitchen LOSARTAN POTASSIUM 100 MG PO TABS Oral Take 100 mg by mouth daily.    Marland Kitchen METFORMIN HCL 850 MG PO TABS Oral Take 850 mg by mouth 3 (three) times daily.    Marland Kitchen  METOCLOPRAMIDE HCL 10 MG PO TABS Oral Take 5 mg by mouth 4 (four) times daily. Before meals and at bedtime    . ONDANSETRON HCL 4 MG PO TABS Oral Take 4 mg by mouth every 6 (six) hours as needed. For nausea    . OXYCODONE HCL 30 MG PO TABS Oral Take 30 mg by mouth every 4 (four) hours as needed. For breakthrough pain    . OXYCODONE HCL ER 60 MG PO TB12 Oral Take 1 tablet (60 mg total) by mouth every 12 (twelve) hours. 10 tablet 0  . PANTOPRAZOLE SODIUM 40 MG PO TBEC Oral Take 40 mg by mouth daily.    . SENNA-DOCUSATE SODIUM 8.6-50 MG PO TABS Oral Take 1 tablet by mouth 2 (two) times daily as needed. For constipation      BP 149/76  Pulse 97  Temp 98.4 F (36.9 C) (Oral)  Resp 18  SpO2 93%  Physical Exam  Nursing note and vitals reviewed. Constitutional: He appears well-developed and well-nourished. No distress.  HENT:    Head: Normocephalic and atraumatic.  Eyes:       Normal appearance  Neck: Normal range of motion.  Cardiovascular: Normal rate.   Pulmonary/Chest: Effort normal.  Musculoskeletal: Normal range of motion.       Pt initially c/o pain to the L ankle but later states pain to R ankle is greater ttp to bilateral lat ankles NVI to bilat feet, DP/PT pulses intact, sensory grossly intact to lt touch  Neurological: He is alert.  Skin: Skin is warm and dry. He is not diaphoretic.  Psychiatric: He has a normal mood and affect.    ED Course  Procedures (including critical care time)  Labs Reviewed - No data to display Dg Ankle Complete Left  11/18/2011  *RADIOLOGY REPORT*  Clinical Data: Fall.  Pain  LEFT ANKLE COMPLETE - 3+ VIEW  Comparison: None.  Findings: Negative for fracture.  Pes planum.  No significant degenerative change in the ankle joint.  IMPRESSION: No acute abnormality.  Original Report Authenticated By: Camelia Phenes, M.D.   Dg Ankle Complete Right  11/18/2011  *RADIOLOGY REPORT*  Clinical Data: Fall  RIGHT ANKLE - COMPLETE 3+ VIEW  Comparison: None.  Findings: Normal alignment and no fracture.  Mild degenerative change in the ankle joint.  Pes planum.  IMPRESSION: Negative for fracture  Original Report Authenticated By: Camelia Phenes, M.D.     1. Bilateral ankle pain       MDM  Pt presents with bilateral ankle pain. He has negative imaging studies of the ankles. Pt and mom express some concern over his ability to live at home safely as he lives alone and has had several falls recently. Pt given home health referral. Pt instructed on reasons to return to the ED.        Grant Fontana, PA-C 11/20/11 0037  Grant Fontana, PA-C 11/20/11 [phone]

## 2011-11-20 NOTE — ED Provider Notes (Signed)
Medical screening examination/treatment/procedure(s) were performed by non-physician practitioner and as supervising physician I was immediately available for consultation/collaboration.  Gerhard Munch, MD 11/20/11 0040

## 2011-12-12 ENCOUNTER — Encounter: Payer: Self-pay | Admitting: Internal Medicine

## 2011-12-12 ENCOUNTER — Ambulatory Visit (INDEPENDENT_AMBULATORY_CARE_PROVIDER_SITE_OTHER): Payer: Medicare Other | Admitting: Internal Medicine

## 2011-12-12 VITALS — BP 144/82 | HR 68 | Ht 76.0 in | Wt 338.0 lb

## 2011-12-12 DIAGNOSIS — E119 Type 2 diabetes mellitus without complications: Secondary | ICD-10-CM

## 2011-12-12 DIAGNOSIS — R1084 Generalized abdominal pain: Secondary | ICD-10-CM

## 2011-12-12 DIAGNOSIS — R112 Nausea with vomiting, unspecified: Secondary | ICD-10-CM

## 2011-12-12 DIAGNOSIS — K253 Acute gastric ulcer without hemorrhage or perforation: Secondary | ICD-10-CM

## 2011-12-12 NOTE — Patient Instructions (Addendum)
Your Endoscopy has been scheduled on 01/14/2012 at 12pm Separate instructions have been given You have also been instructed on your diabetic medications

## 2011-12-15 ENCOUNTER — Encounter: Payer: Self-pay | Admitting: Internal Medicine

## 2011-12-15 NOTE — Progress Notes (Signed)
HISTORY OF PRESENT ILLNESS:  Ryan [name] is a 61 y.o. male with multiple medical problems including hypertension, insulin requiring diabetes mellitus, morbid obesity, coronary artery disease with prior stent placement, chronic back pain with chronic pain syndrome requiring chronic narcotics, and anxiety. He is sent today regarding the need for followup endoscopy. He was hospitalized in May of 2013 with complaints of abdominal pain, nausea/vomiting. He was evaluated in consultation by Dr. Loreta Ave. Imaging studies at that time included CT scan of the abdomen and pelvis as well as gastric emptying study. These were unremarkable. He subsequently underwent upper endoscopy with Dr. Loreta Ave on 09/22/2011. Examination revealed erosive esophagitis, ulcers mass at the gastroesophageal junction, prominent antral fold, and possible polypoid lesion of the ampulla. Biopsies revealed nonspecific inflammation. Patient was instructed to avoid NSAIDs and began on PPI therapy. Followup endoscopy in 8 weeks recommended. Patient tells me that he has been compliant with PPI therapy. He also takes H2 receptor antagonist at night. He denies active GERD symptoms. He denies NSAID use. He states that he continues with the same abdominal pain as well as occasional problems with nausea and vomiting. Unchanged from hospital stay. No sustained weight loss. GI review of systems is otherwise negative. He apparently carries the diagnosis of pancreatitis, though this has not been substantiated, as best I can tell. He is status post cholecystectomy at the [facility] in [place] 3-4 years ago. He has had GI evaluations, by his report, outside [place] he reports that his multiple chronic medical problems are stable.  REVIEW OF SYSTEMS:  All non-GI ROS negative except for anxiety, arthritis, back pain, insomnia, ankle swelling  Past Medical History  Diagnosis Date  . Hypertension   . Diabetes mellitus   . DVT (deep venous thrombosis)     . Chronic back pain   . 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 Hyperlipidemia   . CAD (coronary atherosclerotic disease)     s/p stents x2  . Small bowel obstruction   . Pancreatitis   . Anxiety     Past Surgical History  Procedure Date  . Coronary angioplasty with stent placement   . Tonsillectomy and adenoidectomy   . Cholecystectomy   . Back surgery   . Esophagogastroduodenoscopy 09/22/2011    Procedure: ESOPHAGOGASTRODUODENOSCOPY (EGD);  Surgeon: Charna Elizabeth, MD;  Location: [facility] ENDOSCOPY;  Service: Endoscopy;  Laterality: N/A;    Social History TERYN GUST  reports that he has been smoking Cigarettes.  He has a 7.5 pack-year smoking history. He has never used smokeless tobacco. He reports that he does not drink alcohol or use illicit drugs.  family history includes Diabetes in his father.  There is no history of Colon cancer.  Allergies  Allergen Reactions  . Lisinopril Swelling and Rash  . Morphine And Related Swelling and Rash       PHYSICAL EXAMINATION: Vital signs: BP 144/82  Pulse 68  Ht 6\' 4"  (1.93 m)  Wt 338 lb (153.316 kg)  BMI 41.14 kg/m2  Constitutional: obese,generally well-appearing, no acute distress Psychiatric: alert and oriented x3, cooperative Eyes: extraocular movements intact, anicteric, conjunctiva pink Mouth: oral pharynx moist, no lesions Neck: supple no lymphadenopathy Cardiovascular: heart regular rate and rhythm, no murmur Lungs: clear to auscultation bilaterally Abdomen: soft,obese, complaints of tenderness with minimal palpation throughout, nondistended, no obvious ascites, no peritoneal signs, normal bowel sounds, no organomegaly or mass Extremities: trace lower extremity edema bilaterally Skin: no lesions on visible extremities Neuro: No  focal deficits.   ASSESSMENT:  #1. Chronic abdominal pain. Not clear that his minimal findings on endoscopy substantiate his complaints. #2. Upper endoscopy with  esophagitis, proximal gastric ulcer, and questionable ampullary abnormality #3. Chronic pain syndrome on chronic narcotics #4. Multiple medical problems including coronary artery disease and insulin requiring diabetes mellitus   PLAN:  #1. Followup endoscopy to assess upper GI mucosa as well as evaluate ampullary region. The patient is high-risk given his comorbidities and morbid obesity.The nature of the procedure, as well as the risks, benefits, and alternatives were carefully and thoroughly reviewed with the patient. Ample time for discussion and questions allowed. The patient understood, was satisfied, and agreed to proceed. He states that he was not effectively sedated during his last exam. I recommended CRNA supervised propofol sedation at the hospital. He understands and agrees #2. Hold diabetic medications the morning of his procedure. We will monitor his blood sugar immediately before and after his examination #3. Continue PPI #4. Continue to avoid NSAIDs #5. Ongoing general medical care with Dr. Creta Levin

## 2011-12-20 ENCOUNTER — Encounter (HOSPITAL_COMMUNITY): Payer: Self-pay

## 2011-12-20 ENCOUNTER — Emergency Department (HOSPITAL_COMMUNITY)
Admission: EM | Admit: 2011-12-20 | Discharge: 2011-12-21 | Disposition: A | Discharge status: Home / Self Care | Payer: Medicare Other | Attending: Emergency Medicine | Admitting: Emergency Medicine

## 2011-12-20 ENCOUNTER — Emergency Department (HOSPITAL_COMMUNITY): Payer: Medicare Other

## 2011-12-20 DIAGNOSIS — R112 Nausea with vomiting, unspecified: Secondary | ICD-10-CM | POA: Insufficient documentation

## 2011-12-20 DIAGNOSIS — F17200 Nicotine dependence, unspecified, uncomplicated: Secondary | ICD-10-CM | POA: Insufficient documentation

## 2011-12-20 DIAGNOSIS — Z7982 Long term (current) use of aspirin: Secondary | ICD-10-CM | POA: Insufficient documentation

## 2011-12-20 DIAGNOSIS — Z79899 Other long term (current) drug therapy: Secondary | ICD-10-CM | POA: Insufficient documentation

## 2011-12-20 DIAGNOSIS — E119 Type 2 diabetes mellitus without complications: Secondary | ICD-10-CM | POA: Insufficient documentation

## 2011-12-20 DIAGNOSIS — I2510 Atherosclerotic heart disease of native coronary artery without angina pectoris: Secondary | ICD-10-CM | POA: Insufficient documentation

## 2011-12-20 DIAGNOSIS — R109 Unspecified abdominal pain: Secondary | ICD-10-CM | POA: Insufficient documentation

## 2011-12-20 DIAGNOSIS — I10 Essential (primary) hypertension: Secondary | ICD-10-CM | POA: Insufficient documentation

## 2011-12-20 DIAGNOSIS — M79609 Pain in unspecified limb: Secondary | ICD-10-CM | POA: Insufficient documentation

## 2011-12-20 DIAGNOSIS — Z794 Long term (current) use of insulin: Secondary | ICD-10-CM | POA: Insufficient documentation

## 2011-12-20 DIAGNOSIS — Z86718 Personal history of other venous thrombosis and embolism: Secondary | ICD-10-CM | POA: Insufficient documentation

## 2011-12-20 DIAGNOSIS — E785 Hyperlipidemia, unspecified: Secondary | ICD-10-CM | POA: Insufficient documentation

## 2011-12-20 LAB — URINALYSIS, ROUTINE W REFLEX MICROSCOPIC
Bilirubin Urine: NEGATIVE
Ketones, ur: 15 mg/dL — AB
Nitrite: NEGATIVE
Specific Gravity, Urine: >1.046 — ABNORMAL HIGH (ref 1.005–1.030)
Urobilinogen, UA: 0.2 mg/dL (ref 0.0–1.0)

## 2011-12-20 LAB — CBC WITH DIFFERENTIAL/PLATELET
Eosinophils Relative: 0 % (ref 0–5)
HCT: 44.4 % (ref 39.0–52.0)
Hemoglobin: 14.3 g/dL (ref 13.0–17.0)
Lymphocytes Relative: 21 % (ref 12–46)
Lymphs Abs: 1.4 10*3/uL (ref 0.7–4.0)
MCV: 94.5 fL (ref 78.0–100.0)
Monocytes Absolute: 0.4 10*3/uL (ref 0.1–1.0)
Monocytes Relative: 6 % (ref 3–12)
RBC: 4.7 MIL/uL (ref 4.22–5.81)
WBC: 6.7 10*3/uL (ref 4.0–10.5)

## 2011-12-20 LAB — COMPREHENSIVE METABOLIC PANEL
ALT: 13 U/L (ref 0–53)
CO2: 28 mEq/L (ref 19–32)
Calcium: 10.5 mg/dL (ref 8.4–10.5)
GFR calc Af Amer: >90 mL/min (ref >90)
GFR calc non Af Amer: >90 mL/min (ref >90)
Glucose, Bld: 211 mg/dL — ABNORMAL HIGH (ref 70–99)
Sodium: 144 mEq/L (ref 135–145)

## 2011-12-20 LAB — URINE MICROSCOPIC-ADD ON

## 2011-12-20 LAB — LIPASE, BLOOD: Lipase: 30 U/L (ref 11–59)

## 2011-12-20 MED ORDER — HYDROMORPHONE HCL PF 1 MG/ML IJ SOLN
1.0000 mg | Freq: Once | INTRAMUSCULAR | Status: AC
  Administered 2011-12-20: 1 mg via INTRAVENOUS
  Filled 2011-12-20: qty 1

## 2011-12-20 MED ORDER — ONDANSETRON HCL 4 MG/2ML IJ SOLN
4.0000 mg | Freq: Once | INTRAMUSCULAR | Status: AC
  Administered 2011-12-20: 4 mg via INTRAVENOUS
  Filled 2011-12-20: qty 2

## 2011-12-20 MED ORDER — IOHEXOL 300 MG/ML  SOLN
100.0000 mL | Freq: Once | INTRAMUSCULAR | Status: AC | PRN
  Administered 2011-12-20: 100 mL via INTRAVENOUS

## 2011-12-20 MED ORDER — OXYCODONE-ACETAMINOPHEN 5-325 MG PO TABS
2.0000 | ORAL_TABLET | Freq: Once | ORAL | Status: AC
  Administered 2011-12-20: 2 via ORAL
  Filled 2011-12-20: qty 2

## 2011-12-20 NOTE — ED Provider Notes (Signed)
History     CSN: [id number]  Arrival date & time 12/20/11  1436   First MD Initiated Contact with Patient 12/20/11 1832      Chief Complaint  Patient presents with  . Abdominal Pain  . Leg Pain  . Emesis    (Consider location/radiation/quality/duration/timing/severity/associated sxs/prior treatment) HPI Comments: Ryan [name] presents for evaluation of abdominal pain and vomiting.  He state it has been ongoing for 3-4 days and has prevented him from taking his medications.  He denies fever, cough, SOB, HA, diarrhea, melena, hematochezia, or hematemesis.  He states he had similar pain previously associated with pancreatitis and with gallstones.  Patient is a 61 y.o. male presenting with abdominal pain, leg pain, and vomiting. The history is provided by the patient. No language interpreter was used.  Abdominal Pain The primary symptoms of the illness include abdominal pain, fatigue, nausea and vomiting. The primary symptoms of the illness do not include fever, shortness of breath, diarrhea, hematemesis, hematochezia or dysuria. The current episode started more than 2 days ago. The onset of the illness was gradual. The problem has not changed since onset. Associated with: denies EtOH use/abuse. The patient has not had a change in bowel habit. Symptoms associated with the illness do not include chills, diaphoresis, heartburn, constipation, urgency, hematuria, frequency or back pain. Significant associated medical issues include inflammatory bowel disease, diabetes and gallstones. Associated medical issues comments: has hx of pancreatitis, SBO.  Leg Pain   Emesis  Associated symptoms include abdominal pain. Pertinent negatives include no chills, no cough, no diarrhea, no fever and no headaches.    Past Medical History  Diagnosis Date  . Hypertension   . Diabetes mellitus   . DVT (deep venous thrombosis)   . Chronic back pain   . 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 Hyperlipidemia   . CAD (coronary atherosclerotic disease)     s/p stents x2  . Small bowel obstruction   . Pancreatitis   . Anxiety     Past Surgical History  Procedure Date  . Coronary angioplasty with stent placement   . Tonsillectomy and adenoidectomy   . Cholecystectomy   . Back surgery   . Esophagogastroduodenoscopy 09/22/2011    Procedure: ESOPHAGOGASTRODUODENOSCOPY (EGD);  Surgeon: Charna Elizabeth, MD;  Location: [facility] ENDOSCOPY;  Service: Endoscopy;  Laterality: N/A;    Family History  Problem Relation Age of Onset  . Diabetes Father   . Colon cancer Neg Hx     History  Substance Use Topics  . Smoking status: Current Some Day Smoker -- 0.2 packs/day for 30 years    Types: Cigarettes  . Smokeless tobacco: Never Used  . Alcohol Use: No      Review of Systems  Constitutional: Positive for appetite change and fatigue. Negative for fever, chills, diaphoresis and activity change.  HENT: Negative.   Eyes: Negative.   Respiratory: Negative for cough, chest tightness and shortness of breath.   Gastrointestinal: Positive for nausea, vomiting and abdominal pain. Negative for heartburn, diarrhea, constipation, hematochezia and hematemesis.  Genitourinary: Negative for dysuria, urgency, frequency and hematuria.  Musculoskeletal: Negative for back pain.  Neurological: Positive for weakness and light-headedness. Negative for dizziness, tremors, seizures, syncope and headaches.  Psychiatric/Behavioral: Negative.     Allergies  Lisinopril and Morphine and related  Home Medications   Current Outpatient Rx  Name Route Sig Dispense Refill  . ALPRAZOLAM 1 MG PO TABS Oral Take 1 tablet (1 mg  total) by mouth 2 (two) times daily. 10 tablet 0  . ASPIRIN EC 81 MG PO TBEC Oral Take 81 mg by mouth daily.    . DULOXETINE HCL 30 MG PO CPEP Oral Take 1 capsule (30 mg total) by mouth daily.    Marland Kitchen GABAPENTIN 400 MG PO CAPS Oral Take 400 mg by mouth 3 (three) times daily.    Marland Kitchen GLIPIZIDE ER  10 MG PO TB24 Oral Take 10 mg by mouth 2 (two) times daily.    . INSULIN ASPART 100 UNIT/ML [place] SOLN Subcutaneous Inject 15 Units into the skin 3 (three) times daily with meals.    . INSULIN GLARGINE 100 UNIT/ML [place] SOLN Subcutaneous Inject 10 Units into the skin at bedtime.    Marland Kitchen LACTULOSE 10 GM/15ML PO SOLN Oral Take 20 g by mouth 2 (two) times daily.    Marland Kitchen LOSARTAN POTASSIUM 100 MG PO TABS Oral Take 100 mg by mouth daily.    Marland Kitchen METFORMIN HCL 850 MG PO TABS Oral Take 850 mg by mouth 3 (three) times daily.    Marland Kitchen METOCLOPRAMIDE HCL 10 MG PO TABS Oral Take 5 mg by mouth 4 (four) times daily. Before meals and at bedtime    . ONDANSETRON HCL 4 MG PO TABS Oral Take 4 mg by mouth every 6 (six) hours as needed. For nausea    . OXYCODONE HCL 30 MG PO TABS Oral Take 30 mg by mouth every 4 (four) hours as needed. For breakthrough pain    . OXYCODONE HCL ER 60 MG PO TB12 Oral Take 1 tablet (60 mg total) by mouth every 12 (twelve) hours. 10 tablet 0  . PANTOPRAZOLE SODIUM 40 MG PO TBEC Oral Take 40 mg by mouth daily.    Marland Kitchen ZANTAC PO Oral Take 1 capsule by mouth daily.    . SENNA-DOCUSATE SODIUM 8.6-50 MG PO TABS Oral Take 1 tablet by mouth 2 (two) times daily as needed. For constipation      BP 159/81  Pulse 90  Temp 98.6 F (37 C) (Oral)  Resp 18  SpO2 95%  Physical Exam  Nursing note and vitals reviewed. Constitutional: He is oriented to person, place, and time. He appears well-developed and well-nourished. No distress.  HENT:  Head: Normocephalic and atraumatic.  Right Ear: External ear normal.  Left Ear: External ear normal.  Nose: Nose normal.  Mouth/Throat: Oropharynx is clear and moist. No oropharyngeal exudate.  Eyes: Conjunctivae and EOM are normal. Pupils are equal, round, and reactive to light. Right eye exhibits no discharge. Left eye exhibits no discharge. No scleral icterus.  Neck: Normal range of motion. Neck supple. No JVD present. No tracheal deviation present. No thyromegaly present.    Cardiovascular: Normal rate, regular rhythm, S1 normal, S2 normal, intact distal pulses and normal pulses.  PMI is not displaced.  Exam reveals distant heart sounds. Exam reveals no S3, no S4 and no friction rub.   Murmur heard. Pulmonary/Chest: Effort normal and breath sounds normal. No stridor. No respiratory distress. He has no wheezes. He has no rales. He exhibits no tenderness.  Abdominal: Soft. He exhibits no fluid wave and no pulsatile midline mass. There is generalized tenderness. There is no rigidity, no rebound, no guarding, no CVA tenderness, no tenderness at McBurney's point and negative Murphy's sign.       Obese, protuberant  Musculoskeletal: Normal range of motion. He exhibits edema. He exhibits no tenderness.  Lymphadenopathy:    He has no cervical adenopathy.  Neurological:  He is alert and oriented to person, place, and time.  Skin: Skin is warm and dry. No rash noted. He is not diaphoretic. No erythema. No pallor.  Psychiatric: He has a normal mood and affect. His behavior is normal. Judgment and thought content normal.    ED Course  Procedures (including critical care time)  Labs Reviewed  COMPREHENSIVE METABOLIC PANEL - Abnormal; Notable for the following:    Glucose, Bld 211 (*)     Total Bilirubin 0.2 (*)     All other components within normal limits  CBC WITH DIFFERENTIAL  URINALYSIS, ROUTINE W REFLEX MICROSCOPIC  AMYLASE  LIPASE, BLOOD   No results found.   No diagnosis found.    MDM  Pt present for evaluation of NVD.  He is actively vomiting, note stable VS.  Plan routine belly labs, symptomatic control, and imaging.  Will review results as available and provide appropriate disposition.  1858.  Note no leukocytosis or abnl LFTs.  No anion gap.    2250.  Although there are some abnormal findings noted on the CT scan, pt has no focal pain on exam.  He has no clinical evidence of a UTI/pyelo.  The scan and labs are not consistent with pancreatitis.  He  has a known hx of DM neuropathy and his description of s/s is concerning for DM gastroparesis.  Plan start po reglan with prn zofran also.  Pt will f/u closely with his PMD.      Tobin Chad, MD 12/21/11 0001

## 2011-12-20 NOTE — ED Notes (Signed)
Pt to CT scan.

## 2011-12-20 NOTE — ED Notes (Signed)
3-4 days of vomiting, abd pain, not feeling well and leg pain.

## 2011-12-20 NOTE — ED Notes (Signed)
Pt given water for fluid challenge. Requesting update from MD on test results. MD notified.

## 2011-12-21 MED ORDER — ONDANSETRON HCL 4 MG PO TABS: 4.0000 mg | ORAL_TABLET | Freq: Four times a day (QID) | ORAL | Status: DC

## 2011-12-21 MED ORDER — METOCLOPRAMIDE HCL 10 MG PO TABS: 10.0000 mg | ORAL_TABLET | Freq: Three times a day (TID) | ORAL | Status: DC

## 2011-12-25 ENCOUNTER — Encounter (HOSPITAL_COMMUNITY): Payer: Self-pay | Admitting: *Deleted

## 2011-12-25 ENCOUNTER — Emergency Department (HOSPITAL_COMMUNITY): Payer: Medicare Other

## 2011-12-25 ENCOUNTER — Emergency Department (HOSPITAL_COMMUNITY)
Admission: EM | Admit: 2011-12-25 | Discharge: 2011-12-25 | Disposition: A | Discharge status: Home / Self Care | Payer: Medicare Other | Attending: Emergency Medicine | Admitting: Emergency Medicine

## 2011-12-25 ENCOUNTER — Other Ambulatory Visit: Payer: Self-pay

## 2011-12-25 DIAGNOSIS — R5381 Other malaise: Secondary | ICD-10-CM

## 2011-12-25 DIAGNOSIS — Z794 Long term (current) use of insulin: Secondary | ICD-10-CM | POA: Insufficient documentation

## 2011-12-25 DIAGNOSIS — I10 Essential (primary) hypertension: Secondary | ICD-10-CM | POA: Insufficient documentation

## 2011-12-25 DIAGNOSIS — Z7982 Long term (current) use of aspirin: Secondary | ICD-10-CM | POA: Insufficient documentation

## 2011-12-25 DIAGNOSIS — R109 Unspecified abdominal pain: Secondary | ICD-10-CM | POA: Insufficient documentation

## 2011-12-25 DIAGNOSIS — E119 Type 2 diabetes mellitus without complications: Secondary | ICD-10-CM | POA: Insufficient documentation

## 2011-12-25 DIAGNOSIS — G8929 Other chronic pain: Secondary | ICD-10-CM

## 2011-12-25 DIAGNOSIS — Z79899 Other long term (current) drug therapy: Secondary | ICD-10-CM | POA: Insufficient documentation

## 2011-12-25 DIAGNOSIS — Z86718 Personal history of other venous thrombosis and embolism: Secondary | ICD-10-CM | POA: Insufficient documentation

## 2011-12-25 DIAGNOSIS — I2510 Atherosclerotic heart disease of native coronary artery without angina pectoris: Secondary | ICD-10-CM | POA: Insufficient documentation

## 2011-12-25 DIAGNOSIS — Z9089 Acquired absence of other organs: Secondary | ICD-10-CM | POA: Insufficient documentation

## 2011-12-25 DIAGNOSIS — F17200 Nicotine dependence, unspecified, uncomplicated: Secondary | ICD-10-CM | POA: Insufficient documentation

## 2011-12-25 DIAGNOSIS — M79609 Pain in unspecified limb: Secondary | ICD-10-CM | POA: Insufficient documentation

## 2011-12-25 LAB — COMPREHENSIVE METABOLIC PANEL
AST: 10 U/L (ref 0–37)
BUN: 10 mg/dL (ref 6–23)
CO2: 24 mEq/L (ref 19–32)
Chloride: 102 mEq/L (ref 96–112)
Creatinine, Ser: 0.77 mg/dL (ref 0.50–1.35)
GFR calc non Af Amer: >90 mL/min (ref >90)
Glucose, Bld: 188 mg/dL — ABNORMAL HIGH (ref 70–99)
Total Bilirubin: 0.3 mg/dL (ref 0.3–1.2)

## 2011-12-25 LAB — CBC WITH DIFFERENTIAL/PLATELET
HCT: 44.7 % (ref 39.0–52.0)
Hemoglobin: 14.8 g/dL (ref 13.0–17.0)
Lymphocytes Relative: 33 % (ref 12–46)
Lymphs Abs: 2.4 10*3/uL (ref 0.7–4.0)
Monocytes Absolute: 0.7 10*3/uL (ref 0.1–1.0)
Monocytes Relative: 10 % (ref 3–12)
Neutro Abs: 3.9 10*3/uL (ref 1.7–7.7)
WBC: 7.4 10*3/uL (ref 4.0–10.5)

## 2011-12-25 LAB — LIPASE, BLOOD: Lipase: 84 U/L — ABNORMAL HIGH (ref 11–59)

## 2011-12-25 LAB — URINALYSIS, ROUTINE W REFLEX MICROSCOPIC
Hgb urine dipstick: NEGATIVE
Protein, ur: NEGATIVE mg/dL
Urobilinogen, UA: 0.2 mg/dL (ref 0.0–1.0)

## 2011-12-25 MED ORDER — SODIUM CHLORIDE 0.9 % IV SOLN: Freq: Once | INTRAVENOUS | Status: DC

## 2011-12-25 MED ORDER — PANTOPRAZOLE SODIUM 40 MG IV SOLR
40.0000 mg | Freq: Once | INTRAVENOUS | Status: AC
  Administered 2011-12-25: 40 mg via INTRAVENOUS
  Filled 2011-12-25: qty 40

## 2011-12-25 MED ORDER — SODIUM CHLORIDE 0.9 % IV BOLUS (SEPSIS)
1000.0000 mL | Freq: Once | INTRAVENOUS | Status: AC
  Administered 2011-12-25: 1000 mL via INTRAVENOUS

## 2011-12-25 MED ORDER — HYDROMORPHONE HCL PF 1 MG/ML IJ SOLN
1.0000 mg | Freq: Once | INTRAMUSCULAR | Status: AC
  Administered 2011-12-25: 1 mg via INTRAVENOUS
  Filled 2011-12-25: qty 1

## 2011-12-25 MED ORDER — HYDROMORPHONE HCL PF 2 MG/ML IJ SOLN
2.0000 mg | INTRAMUSCULAR | Status: DC | PRN
  Administered 2011-12-25 (×2): 2 mg via INTRAVENOUS
  Filled 2011-12-25 (×2): qty 1

## 2011-12-25 MED ORDER — METOCLOPRAMIDE HCL 5 MG/ML IJ SOLN
10.0000 mg | Freq: Once | INTRAMUSCULAR | Status: AC
  Administered 2011-12-25: 10 mg via INTRAVENOUS
  Filled 2011-12-25: qty 2

## 2011-12-25 MED ORDER — ONDANSETRON HCL 4 MG/2ML IJ SOLN
4.0000 mg | Freq: Once | INTRAMUSCULAR | Status: AC
  Administered 2011-12-25: 4 mg via INTRAVENOUS
  Filled 2011-12-25: qty 2

## 2011-12-25 MED ORDER — ONDANSETRON HCL 4 MG/2ML IJ SOLN
4.0000 mg | Freq: Once | INTRAMUSCULAR | Status: DC
  Filled 2011-12-25: qty 2

## 2011-12-25 NOTE — ED Provider Notes (Signed)
1440 report received from [place] PA for 61 yo male patient with chronic pain he takes oxycodone 30 mg of OxyContin 60 mg throughout the day. He also has a past medical history of diabetes DVT pancreatitis small bowel obstruction, paratrooper jumper in the service.   Here today with abdominal pain with a recent negative CT. He is going to a GI specialist that showed erosive esophagitis with biopsies.  Patient vomited in route to the ER.  Denies nausea presently.  We will keep him hydrated and start po's.  Case manager will see him to see what he needs at home.  Already had [place] home care coming to the house.    Remi Haggard, NP 12/25/11 1605

## 2011-12-25 NOTE — ED Provider Notes (Signed)
Sign Out from Pod B/D: This is a 60 year old male complaining of nausea vomiting and epigastric abdominal pain. Patient has chronic pain and takes high-dose narcotic pain medications at home. Patient has a history of SBO, chronic pancreatitis. Labs are unremarkable except for a slightly elevated lipase at 84. Patient is tolerating by mouth with excellent appetite.  1830 patient seen and evaluated in no acute distress. Patient is resting comfortably after eating a full meal. He states that he has pain returning after last lauded dose several hours ago. Heart sounds are regular rate and rhythm with no murmurs rubs and gallops, lung sounds are clear to auscultation bilaterally. There are normal bowel sounds and patient is tender to palpation of left and right upper quadrant. There are no peritoneal signs.  Social work consult is pending  6:52 PM patient seen and evaluated no acute distress he is resting comfortably and feeling much better after receiving another dose of Dilaudid. Abdomen remains benign with mild tenderness to palpation of upper quadrants and pneumoperitoneal signs.  Social work consult appreciated patient will be placed in assisted living when his Medicare becomes active in the next 4 weeks.  This patient is tolerating by mouth he will be discharged.   Wynetta Emery, PA-C 12/25/11 1853

## 2011-12-25 NOTE — ED Provider Notes (Signed)
History     CSN: [id number]  Arrival date & time 12/25/11  1039   First MD Initiated Contact with Patient 12/25/11 1056      Chief Complaint  Patient presents with  . Abdominal Pain  . Leg Pain    (Consider location/radiation/quality/duration/timing/severity/associated sxs/prior treatment) HPI  61 year old male with history of diabetes, DVT, pancreatitis, and small bowel obstruction presents complaining of a weeklong history of abdominal pain. Patient reports for over a week he has been having abdominal pain with associate nausea, vomiting and diarrhea. Describe pain as a sharp sensation to his left abdomen that is nonradiating. Pain worsening each time he eats. States he's unable to keep down any fluid, food, or medication. Reports feeling generalized weakness, unable to take care of himself. Patient is at home by himself. Patient also reports pain to both of his leg. Describe pain as throbbing sensation worsening with walking.  Pain radiated up and down his legs. Had similar pain before. Patient also endorsed chest tightness but no chest pain. Denies shortness of breath on exertion, but does endorse feeling tired with any activity. He has no appetite. He has been seen by primary care Dr., and by ED for same complaint multiple times. States despite taking medication that was prescribed, is unable to keep any medication down and has not helped. Patient was seen for the same complaint 5 days ago.  His workup was unremarkable at that time. Pt has multiple evaluation for his abd pain.  He subsequently underwent upper endoscopy with Dr. Loreta Ave on 09/22/2011. Examination revealed erosive esophagitis, ulcers mass at the gastroesophageal junction, prominent antral fold, and possible polypoid lesion of the ampulla. Biopsies revealed nonspecific inflammation.   Past Medical History  Diagnosis Date  . Hypertension   . Diabetes mellitus   . DVT (deep venous thrombosis)   . Chronic back pain   .  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 Hyperlipidemia   . CAD (coronary atherosclerotic disease)     s/p stents x2  . Small bowel obstruction   . Pancreatitis   . Anxiety     Past Surgical History  Procedure Date  . Coronary angioplasty with stent placement   . Tonsillectomy and adenoidectomy   . Cholecystectomy   . Back surgery   . Esophagogastroduodenoscopy 09/22/2011    Procedure: ESOPHAGOGASTRODUODENOSCOPY (EGD);  Surgeon: Charna Elizabeth, MD;  Location: [facility] ENDOSCOPY;  Service: Endoscopy;  Laterality: N/A;    Family History  Problem Relation Age of Onset  . Diabetes Father   . Colon cancer Neg Hx     History  Substance Use Topics  . Smoking status: Current Some Day Smoker -- 0.2 packs/day for 30 years    Types: Cigarettes  . Smokeless tobacco: Never Used  . Alcohol Use: No      Review of Systems  All other systems reviewed and are negative.    Allergies  Lisinopril and Morphine and related  Home Medications   Current Outpatient Rx  Name Route Sig Dispense Refill  . ALPRAZOLAM 1 MG PO TABS Oral Take 1 tablet (1 mg total) by mouth 2 (two) times daily. 10 tablet 0  . ASPIRIN EC 81 MG PO TBEC Oral Take 81 mg by mouth daily.    . DULOXETINE HCL 30 MG PO CPEP Oral Take 1 capsule (30 mg total) by mouth daily.    Marland Kitchen GABAPENTIN 400 MG PO CAPS Oral Take 400 mg by mouth 3 (three) times daily.    Marland Kitchen  GLIPIZIDE ER 10 MG PO TB24 Oral Take 10 mg by mouth 2 (two) times daily.    . INSULIN ASPART 100 UNIT/ML [place] SOLN Subcutaneous Inject 15 Units into the skin 3 (three) times daily with meals.    . INSULIN GLARGINE 100 UNIT/ML [place] SOLN Subcutaneous Inject 10 Units into the skin at bedtime.    Marland Kitchen LACTULOSE 10 GM/15ML PO SOLN Oral Take 20 g by mouth 2 (two) times daily.    Marland Kitchen LOSARTAN POTASSIUM 100 MG PO TABS Oral Take 100 mg by mouth daily.    Marland Kitchen METFORMIN HCL 850 MG PO TABS Oral Take 850 mg by mouth 3 (three) times daily.    Marland Kitchen METOCLOPRAMIDE HCL 10 MG PO TABS  Oral Take 5 mg by mouth 4 (four) times daily. Before meals and at bedtime    . METOCLOPRAMIDE HCL 10 MG PO TABS Oral Take 1 tablet (10 mg total) by mouth 3 (three) times daily. 15 tablet 0  . ONDANSETRON HCL 4 MG PO TABS Oral Take 4 mg by mouth every 6 (six) hours as needed. For nausea    . ONDANSETRON HCL 4 MG PO TABS Oral Take 1 tablet (4 mg total) by mouth every 6 (six) hours. 12 tablet 0  . OXYCODONE HCL 30 MG PO TABS Oral Take 30 mg by mouth every 4 (four) hours as needed. For breakthrough pain    . OXYCODONE HCL ER 60 MG PO TB12 Oral Take 1 tablet (60 mg total) by mouth every 12 (twelve) hours. 10 tablet 0  . PANTOPRAZOLE SODIUM 40 MG PO TBEC Oral Take 40 mg by mouth daily.    Marland Kitchen ZANTAC PO Oral Take 1 capsule by mouth daily.    . SENNA-DOCUSATE SODIUM 8.6-50 MG PO TABS Oral Take 1 tablet by mouth 2 (two) times daily as needed. For constipation      BP 144/89  Pulse 78  Temp 99.7 F (37.6 C) (Oral)  Resp 16  SpO2 100%  Physical Exam  Nursing note and vitals reviewed. Constitutional: He is oriented to person, place, and time. He appears well-developed and well-nourished. No distress.       Awake, alert, nontoxic appearance  HENT:  Head: Atraumatic.  Mouth/Throat: Oropharynx is clear and moist.  Eyes: Conjunctivae are normal. Right eye exhibits no discharge. Left eye exhibits no discharge.  Neck: Normal range of motion. Neck supple.  Cardiovascular: Normal rate and regular rhythm.  Exam reveals no gallop and no friction rub.   No murmur heard. Pulmonary/Chest: Effort normal. No respiratory distress. He exhibits tenderness.       Generalized abdominal tenderness, more significant to L abdomen without guarding or rebound tenderness.  No overlying skin changes.   Abdominal: Soft. He exhibits no mass. There is no tenderness. There is no rebound.  Musculoskeletal: He exhibits no tenderness.       ROM appears intact, no obvious focal weakness.  4/5 strength to lower extremities bilat.     Neurological: He is alert and oriented to person, place, and time. He has normal reflexes.  Skin: Skin is warm and dry. No rash noted.  Psychiatric: He has a normal mood and affect.    ED Course  Procedures (including critical care time)   Labs Reviewed  CBC WITH DIFFERENTIAL  COMPREHENSIVE METABOLIC PANEL   Results for orders placed during the hospital encounter of 12/25/11  CBC WITH DIFFERENTIAL      Component Value Range   WBC 7.4  4.0 - 10.5 K/uL  RBC 4.72  4.22 - 5.81 MIL/uL   Hemoglobin 14.8  13.0 - 17.0 g/dL   HCT 16.1  09.6 - 04.5 %   MCV 94.7  78.0 - 100.0 fL   MCH 31.4  26.0 - 34.0 pg   MCHC 33.1  30.0 - 36.0 g/dL   RDW 40.9  81.1 - 91.4 %   Platelets 205  150 - 400 K/uL   Neutrophils Relative 53  43 - 77 %   Neutro Abs 3.9  1.7 - 7.7 K/uL   Lymphocytes Relative 33  12 - 46 %   Lymphs Abs 2.4  0.7 - 4.0 K/uL   Monocytes Relative 10  3 - 12 %   Monocytes Absolute 0.7  0.1 - 1.0 K/uL   Eosinophils Relative 3  0 - 5 %   Eosinophils Absolute 0.2  0.0 - 0.7 K/uL   Basophils Relative 0  0 - 1 %   Basophils Absolute 0.0  0.0 - 0.1 K/uL  COMPREHENSIVE METABOLIC PANEL      Component Value Range   Sodium 137  135 - 145 mEq/L   Potassium 3.6  3.5 - 5.1 mEq/L   Chloride 102  96 - 112 mEq/L   CO2 24  19 - 32 mEq/L   Glucose, Bld 188 (*) 70 - 99 mg/dL   BUN 10  6 - 23 mg/dL   Creatinine, Ser 7.82  0.50 - 1.35 mg/dL   Calcium 9.5  8.4 - 95.6 mg/dL   Total Protein 8.0  6.0 - 8.3 g/dL   Albumin 3.9  3.5 - 5.2 g/dL   AST 10  0 - 37 U/L   ALT 9  0 - 53 U/L   Alkaline Phosphatase 73  39 - 117 U/L   Total Bilirubin 0.3  0.3 - 1.2 mg/dL   GFR calc non Af Amer >90  >90 mL/min   GFR calc Af Amer >90  >90 mL/min  URINALYSIS, ROUTINE W REFLEX MICROSCOPIC      Component Value Range   Color, Urine YELLOW  YELLOW   APPearance CLEAR  CLEAR   Specific Gravity, Urine 1.013  1.005 - 1.030   pH 6.5  5.0 - 8.0   Glucose, UA NEGATIVE  NEGATIVE mg/dL   Hgb urine dipstick NEGATIVE   NEGATIVE   Bilirubin Urine NEGATIVE  NEGATIVE   Ketones, ur NEGATIVE  NEGATIVE mg/dL   Protein, ur NEGATIVE  NEGATIVE mg/dL   Urobilinogen, UA 0.2  0.0 - 1.0 mg/dL   Nitrite NEGATIVE  NEGATIVE   Leukocytes, UA TRACE (*) NEGATIVE  LIPASE, BLOOD      Component Value Range   Lipase 84 (*) 11 - 59 U/L  URINE MICROSCOPIC-ADD ON      Component Value Range   Squamous Epithelial / LPF RARE  RARE   WBC, UA 0-2  <3 WBC/hpf   Dg Chest 2 View  12/25/2011  *RADIOLOGY REPORT*  Clinical Data: Chest tightness and heaviness.  CHEST - 2 VIEW  Comparison: 06/14/2011.  Findings: Trachea is midline.  Heart size normal.  Lungs are somewhat low in volume with mild perihilar and bibasilar predominant air space disease.  No pleural fluid.  IMPRESSION: Perihilar and bibasilar predominant subtle air space disease, possibly due to edema.   Original Report Authenticated By: Reyes Ivan, M.D.    Ct Abdomen Pelvis W Contrast  12/20/2011  *RADIOLOGY REPORT*  Clinical Data: Abdominal pain.  CT ABDOMEN AND PELVIS WITH CONTRAST  Technique:  Multidetector CT  imaging of the abdomen and pelvis was performed following the standard protocol during bolus administration of intravenous contrast.  Contrast: OMNIPAQUE IOHEXOL 300 MG/ML  SOLN  Comparison: 08/22/2011  Findings: Mild ground-glass opacities at the lung bases.  Heart size within normal limits.  No pleural or pericardial effusion.  Low attenuation of the liver is nonspecific post contrast however can be seen with fatty infiltration. Unremarkable spleen. Mild prominence of the main pancreatic duct, measuring up to 4 mm to the level of the ampulla of where there is soft tissue fullness (image 35 series 2). Absent gallbladder. Mild central biliary ductal dilatation is nonspecific in this setting.  There is a 2.2 cm right adrenal nodule, incompletely characterized however without significant interval change in retrospect.  Unremarkable left adrenal gland.  Mild perinephric  fat stranding.  Nonspecific hypodensity arising from the lower pole of the left kidney, similar in size to prior however remains incompletely characterized.  No hydronephrosis or hydroureter.  The mild distension of the stomach and proximal small bowel.  No overt evidence for obstruction.  Normal appendix.  Subcentimeter ileocolic lymph node.  No free intraperitoneal air or fluid.  There is scattered atherosclerotic calcification of the aorta and its branches. No aneurysmal dilatation.  Circumferential bladder wall thickening is nonspecific given incomplete distension.  Nonspecific prostatic calcifications.  Small fat containing umbilical hernia.  Bilateral SI joint and multilevel vertebral body degenerative changes.  Postoperative changes of the lower lumbar spine.  IMPRESSION: Minimal perinephric fat stranding.  Correlate with urinalysis if concerned for ascending infection.  Mild bibasilar ground-glass opacities; can be seen with chronic changes, atypical infection, or mild edema in the appropriate clinical setting.  Mild distension of the stomach and proximal small bowel without overt evidence of obstruction.  Incompletely characterized right adrenal lesion and left renal lesion.  Recommend MRI.  There is mild main pancreatic and central biliary ductal prominence to the level of soft tissue fullness at the ampulla.  This can also be better characterized at MRI with the addition of an MRCP.   Original Report Authenticated By: Waneta Martins, M.D.     1. Chronic abdominal pain 2. Generalized weakness  MDM:  Pt with chronic abdominal pain and multiple ER visits for same complaints.  Pt sts he would like to be admitted, and sts he's unable to tolerates anything by mouth for the past week.  However, he has normal electrolytes, stable vital sign and no signs of infection.  He has had thorough work up including abd CT 5 days ago.    2:13 PM Attempt to PO trial however pt vomits.  I discussed with my  attending, who does not think pt qualifies for admission.  I will consult with social work to see if we can offer any assistance.  Pt has a surgical procedure scheduled by PCP in Sept for further evaluation of his chronic abdominal pain.     Since pt reports he lives at home and does not have the energy to care for himself or to carry out ADL, i have consulted social worker and case management who agrees to see pt and offer outpt care as appropriate.    2:35 PM Pt is moved to CDU for further management of his abdominal pain and nausea.  Plan to d/c pt once able to tolerates PO and to have social worker to discuss with patient outpt assistance.  I discussed care with CDU PA, who is aware of plan.   Fayrene Helper, PA-C 12/25/11  1516  Fayrene Helper, PA-C 12/25/11 1523

## 2011-12-25 NOTE — ED Notes (Signed)
Attempted 1 IV start. Pt states he is a hard stick. IV team called.

## 2011-12-25 NOTE — ED Notes (Signed)
Regular Diet Ordered 

## 2011-12-25 NOTE — ED Notes (Signed)
Pt requesting more pain medication. PA made aware. [place] sandwich and soup brought to pt bedside to preform PO test. Pt denies nausea at this time.

## 2011-12-25 NOTE — ED Notes (Signed)
Pt vomitted food. Denies nausea at this time. States "I just can't keep my food down." Pt states his pain is increasing. PA made aware.

## 2011-12-25 NOTE — ED Notes (Signed)
Pt reports week long hx of middle to left abdominal pain associated with nausea/vomitting/diarrhea. States unable to keep down meds or food. Pt reports pain running up and down bilateral legs.

## 2011-12-25 NOTE — ED Notes (Signed)
Spoke with pt and offered emotional support.  Pt has a Dealer with whom he has been working with on applying for Medicaid/placement.  Pt reports he is agreeable to returning home and that he is hopeful that his Medicaid will be approved in the coming weeks.

## 2011-12-27 NOTE — ED Provider Notes (Signed)
Medical screening examination/treatment/procedure(s) were conducted as a shared visit with non-physician practitioner(s) and myself.  I personally evaluated the patient during the encounter  Toy Baker, MD 12/27/11 2345

## 2011-12-28 ENCOUNTER — Encounter (HOSPITAL_COMMUNITY): Payer: Self-pay | Admitting: *Deleted

## 2011-12-28 ENCOUNTER — Emergency Department (HOSPITAL_COMMUNITY)
Admission: EM | Admit: 2011-12-28 | Discharge: 2011-12-28 | Disposition: A | Discharge status: Home / Self Care | Payer: Medicare Other | Attending: Emergency Medicine | Admitting: Emergency Medicine

## 2011-12-28 DIAGNOSIS — Z9089 Acquired absence of other organs: Secondary | ICD-10-CM | POA: Insufficient documentation

## 2011-12-28 DIAGNOSIS — I2510 Atherosclerotic heart disease of native coronary artery without angina pectoris: Secondary | ICD-10-CM | POA: Insufficient documentation

## 2011-12-28 DIAGNOSIS — R112 Nausea with vomiting, unspecified: Secondary | ICD-10-CM | POA: Insufficient documentation

## 2011-12-28 DIAGNOSIS — I10 Essential (primary) hypertension: Secondary | ICD-10-CM | POA: Insufficient documentation

## 2011-12-28 DIAGNOSIS — E119 Type 2 diabetes mellitus without complications: Secondary | ICD-10-CM | POA: Insufficient documentation

## 2011-12-28 DIAGNOSIS — G8929 Other chronic pain: Secondary | ICD-10-CM | POA: Insufficient documentation

## 2011-12-28 DIAGNOSIS — F17200 Nicotine dependence, unspecified, uncomplicated: Secondary | ICD-10-CM | POA: Insufficient documentation

## 2011-12-28 DIAGNOSIS — R109 Unspecified abdominal pain: Secondary | ICD-10-CM | POA: Insufficient documentation

## 2011-12-28 DIAGNOSIS — Z86718 Personal history of other venous thrombosis and embolism: Secondary | ICD-10-CM | POA: Insufficient documentation

## 2011-12-28 DIAGNOSIS — Z794 Long term (current) use of insulin: Secondary | ICD-10-CM | POA: Insufficient documentation

## 2011-12-28 DIAGNOSIS — Z79899 Other long term (current) drug therapy: Secondary | ICD-10-CM | POA: Insufficient documentation

## 2011-12-28 LAB — CBC WITH DIFFERENTIAL/PLATELET
Basophils Relative: 1 % (ref 0–1)
Eosinophils Relative: 6 % — ABNORMAL HIGH (ref 0–5)
HCT: 41.5 % (ref 39.0–52.0)
Hemoglobin: 13.7 g/dL (ref 13.0–17.0)
MCH: 31.1 pg (ref 26.0–34.0)
MCHC: 33 g/dL (ref 30.0–36.0)
MCV: 94.3 fL (ref 78.0–100.0)
Monocytes Absolute: 0.5 10*3/uL (ref 0.1–1.0)
Monocytes Relative: 8 % (ref 3–12)
Neutro Abs: 2.6 10*3/uL (ref 1.7–7.7)
RDW: 13.6 % (ref 11.5–15.5)

## 2011-12-28 LAB — URINALYSIS, ROUTINE W REFLEX MICROSCOPIC
Ketones, ur: NEGATIVE mg/dL
Nitrite: NEGATIVE
Protein, ur: NEGATIVE mg/dL
pH: 6 (ref 5.0–8.0)

## 2011-12-28 LAB — COMPREHENSIVE METABOLIC PANEL
Albumin: 3.5 g/dL (ref 3.5–5.2)
BUN: 8 mg/dL (ref 6–23)
Calcium: 9.2 mg/dL (ref 8.4–10.5)
Chloride: 104 mEq/L (ref 96–112)
Creatinine, Ser: 0.77 mg/dL (ref 0.50–1.35)
GFR calc non Af Amer: >90 mL/min (ref >90)
Total Bilirubin: 0.2 mg/dL — ABNORMAL LOW (ref 0.3–1.2)

## 2011-12-28 LAB — LIPASE, BLOOD: Lipase: 61 U/L — ABNORMAL HIGH (ref 11–59)

## 2011-12-28 MED ORDER — SODIUM CHLORIDE 0.9 % IV BOLUS (SEPSIS)
1000.0000 mL | Freq: Once | INTRAVENOUS | Status: AC
  Administered 2011-12-28: 1000 mL via INTRAVENOUS

## 2011-12-28 MED ORDER — HYDROMORPHONE HCL PF 2 MG/ML IJ SOLN
2.0000 mg | Freq: Once | INTRAMUSCULAR | Status: AC
  Administered 2011-12-28: 2 mg via INTRAVENOUS
  Filled 2011-12-28: qty 1

## 2011-12-28 MED ORDER — METOCLOPRAMIDE HCL 10 MG PO TABS: 10.0000 mg | ORAL_TABLET | Freq: Four times a day (QID) | ORAL | Status: DC

## 2011-12-28 MED ORDER — ONDANSETRON HCL 4 MG/2ML IJ SOLN
4.0000 mg | Freq: Once | INTRAMUSCULAR | Status: AC
  Administered 2011-12-28: 4 mg via INTRAVENOUS
  Filled 2011-12-28: qty 2

## 2011-12-28 MED ORDER — HYDROMORPHONE HCL PF 1 MG/ML IJ SOLN
1.0000 mg | Freq: Once | INTRAMUSCULAR | Status: AC
  Administered 2011-12-28: 1 mg via INTRAVENOUS
  Filled 2011-12-28: qty 1

## 2011-12-28 MED ORDER — METOCLOPRAMIDE HCL 5 MG/ML IJ SOLN
10.0000 mg | Freq: Once | INTRAMUSCULAR | Status: AC
  Administered 2011-12-28: 10 mg via INTRAVENOUS
  Filled 2011-12-28: qty 2

## 2011-12-28 NOTE — ED Notes (Signed)
Pt is here with vomiting and abdominal pain to LUQ.  Pt denies diarrhea.  Pt is diabetic.

## 2011-12-28 NOTE — ED Notes (Signed)
IV team paged to start IV per patient request

## 2011-12-28 NOTE — ED Notes (Signed)
Gave pt diet ginger ale and diet coke. Pt was not able to hold fluids down.

## 2011-12-28 NOTE — ED Provider Notes (Signed)
History     CSN: [id number]  Arrival date & time 12/28/11  4098   First MD Initiated Contact with Patient 12/28/11 [phone]      Chief Complaint  Patient presents with  . Emesis  . Abdominal Pain    (Consider location/radiation/quality/duration/timing/severity/associated sxs/prior treatment) HPI Comments: Patient with a history of chronic abdominal pain, SBO, and chronic pancreatitis comes in today with a chief complaint of abdominal pain.  Pain has been present for the past week.  Pain is diffuse, but worse in the LUQ.  He has been seen two times in the ED previously for this same pain.  Pain is no different today than it has been in the past.  He had a CT of his ab/pelvis done on 12/20/11, which did not show a definitive reason for the abdominal pain.   He has also been evaluated by GI in the past.  Previous EGD shows esophagitis.  He has another appointment scheduled for EGD on 01/14/12.   He is on high doses of narcotic pain medication at home, which he states has not helped with the pain.  He has also been vomiting.  He reports several episodes of vomiting last evening.  No blood in his emesis.  He denies diarrhea.  Last BM was yesterday and was normal.  No blood in his stool.    Patient is a 61 y.o. male presenting with vomiting and abdominal pain. The history is provided by the patient.  Emesis  Associated symptoms include abdominal pain. Pertinent negatives include no chills, no diarrhea and no fever.  Abdominal Pain The primary symptoms of the illness include abdominal pain, nausea and vomiting. The primary symptoms of the illness do not include fever, shortness of breath, diarrhea or dysuria.  Symptoms associated with the illness do not include chills, constipation, urgency, hematuria or frequency.    Past Medical History  Diagnosis Date  . Hypertension   . Diabetes mellitus   . DVT (deep venous thrombosis)   . Chronic back pain   . 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 Hyperlipidemia   . CAD (coronary atherosclerotic disease)     s/p stents x2  . Small bowel obstruction   . Pancreatitis   . Anxiety     Past Surgical History  Procedure Date  . Coronary angioplasty with stent placement   . Tonsillectomy and adenoidectomy   . Cholecystectomy   . Back surgery   . Esophagogastroduodenoscopy 09/22/2011    Procedure: ESOPHAGOGASTRODUODENOSCOPY (EGD);  Surgeon: Charna Elizabeth, MD;  Location: [facility] ENDOSCOPY;  Service: Endoscopy;  Laterality: N/A;    Family History  Problem Relation Age of Onset  . Diabetes Father   . Colon cancer Neg Hx     History  Substance Use Topics  . Smoking status: Current Some Day Smoker -- 0.2 packs/day for 30 years    Types: Cigarettes  . Smokeless tobacco: Never Used  . Alcohol Use: Yes     occ      Review of Systems  Constitutional: Negative for fever and chills.  Respiratory: Negative for shortness of breath.   Cardiovascular: Negative for chest pain.  Gastrointestinal: Positive for nausea, vomiting and abdominal pain. Negative for diarrhea, constipation, blood in stool and abdominal distention.  Genitourinary: Negative for dysuria, urgency, frequency, hematuria, scrotal swelling and testicular pain.  Neurological: Negative for dizziness, syncope and light-headedness.    Allergies  Lisinopril and Morphine and related  Home Medications   Current  Outpatient Rx  Name Route Sig Dispense Refill  . ALPRAZOLAM 1 MG PO TABS Oral Take 1 tablet (1 mg total) by mouth 2 (two) times daily. 10 tablet 0  . ASPIRIN EC 81 MG PO TBEC Oral Take 81 mg by mouth daily.    . DULOXETINE HCL 30 MG PO CPEP Oral Take 1 capsule (30 mg total) by mouth daily.    Marland Kitchen GABAPENTIN 400 MG PO CAPS Oral Take 400 mg by mouth 3 (three) times daily.    Marland Kitchen GLIPIZIDE ER 10 MG PO TB24 Oral Take 10 mg by mouth 2 (two) times daily.    . INSULIN ASPART 100 UNIT/ML [place] SOLN Subcutaneous Inject 15 Units into the skin 3 (three) times daily  with meals.    . INSULIN GLARGINE 100 UNIT/ML [place] SOLN Subcutaneous Inject 10 Units into the skin at bedtime.    Marland Kitchen LACTULOSE 10 GM/15ML PO SOLN Oral Take 20 g by mouth 2 (two) times daily.    Marland Kitchen LOSARTAN POTASSIUM 100 MG PO TABS Oral Take 100 mg by mouth daily.    Marland Kitchen METFORMIN HCL 850 MG PO TABS Oral Take 850 mg by mouth 3 (three) times daily.    Marland Kitchen METOCLOPRAMIDE HCL 10 MG PO TABS Oral Take 10 mg by mouth 4 (four) times daily as needed. For nausea     . ONDANSETRON HCL 4 MG PO TABS Oral Take 4 mg by mouth every 6 (six) hours as needed. For nausea    . OXYCODONE HCL 30 MG PO TABS Oral Take 30 mg by mouth every 4 (four) hours as needed. For breakthrough pain    . OXYCODONE HCL ER 60 MG PO TB12 Oral Take 1 tablet (60 mg total) by mouth every 12 (twelve) hours. 10 tablet 0  . PANTOPRAZOLE SODIUM 40 MG PO TBEC Oral Take 40 mg by mouth daily.    Marland Kitchen ZANTAC PO Oral Take 1 capsule by mouth daily.    . SENNA-DOCUSATE SODIUM 8.6-50 MG PO TABS Oral Take 1 tablet by mouth 2 (two) times daily as needed. For constipation      BP 143/80  Pulse 76  Temp 98.2 F (36.8 C) (Oral)  Resp 16  SpO2 97%  Physical Exam  Nursing note and vitals reviewed. Constitutional: He appears well-developed and well-nourished. No distress.  HENT:  Head: Normocephalic and atraumatic.  Mouth/Throat: Oropharynx is clear and moist.  Neck: Normal range of motion. Neck supple.  Cardiovascular: Normal rate, regular rhythm and normal heart sounds.   Pulmonary/Chest: Effort normal and breath sounds normal.  Abdominal: Soft. Bowel sounds are normal. There is tenderness in the epigastric area and left upper quadrant. There is no rebound, no guarding and no tenderness at McBurney's point.  Musculoskeletal: Normal range of motion.  Neurological: He is alert.  Skin: Skin is warm and dry. He is not diaphoretic.  Psychiatric: He has a normal mood and affect.    ED Course  Procedures (including critical care time)   Labs Reviewed    CBC WITH DIFFERENTIAL  COMPREHENSIVE METABOLIC PANEL  LIPASE, BLOOD  URINALYSIS, ROUTINE W REFLEX MICROSCOPIC      No diagnosis found.  2:00 PM Patient able to tolerate PO liquids.  MDM  Patient presents today with chronic abdominal pain.  Patient afebrile.  Labs today are unremarkable.  Patient has had a recent CT of his ab/pelvis, which did not show a definitive cause for the pain.  Patient instructed to follow up with GI and discharged home.  Patient in agreement with the plan.          Pascal Lux [place], PA-C 12/31/11 0100

## 2011-12-29 ENCOUNTER — Other Ambulatory Visit: Payer: Self-pay

## 2011-12-29 ENCOUNTER — Encounter (HOSPITAL_COMMUNITY): Payer: Self-pay | Admitting: *Deleted

## 2011-12-29 ENCOUNTER — Inpatient Hospital Stay (HOSPITAL_COMMUNITY)
Admission: EM | Admit: 2011-12-29 | Discharge: 2012-01-06 | DRG: 392 | Disposition: A | Discharge status: Home / Self Care | Payer: Medicare Other | Attending: Internal Medicine | Admitting: Internal Medicine

## 2011-12-29 DIAGNOSIS — F411 Generalized anxiety disorder: Secondary | ICD-10-CM | POA: Diagnosis present

## 2011-12-29 DIAGNOSIS — K861 Other chronic pancreatitis: Secondary | ICD-10-CM | POA: Diagnosis present

## 2011-12-29 DIAGNOSIS — R933 Abnormal findings on diagnostic imaging of other parts of digestive tract: Secondary | ICD-10-CM

## 2011-12-29 DIAGNOSIS — R109 Unspecified abdominal pain: Principal | ICD-10-CM | POA: Diagnosis present

## 2011-12-29 DIAGNOSIS — Z8719 Personal history of other diseases of the digestive system: Secondary | ICD-10-CM

## 2011-12-29 DIAGNOSIS — Z79899 Other long term (current) drug therapy: Secondary | ICD-10-CM

## 2011-12-29 DIAGNOSIS — M545 Low back pain: Secondary | ICD-10-CM

## 2011-12-29 DIAGNOSIS — E785 Hyperlipidemia, unspecified: Secondary | ICD-10-CM | POA: Diagnosis present

## 2011-12-29 DIAGNOSIS — Z833 Family history of diabetes mellitus: Secondary | ICD-10-CM

## 2011-12-29 DIAGNOSIS — I10 Essential (primary) hypertension: Secondary | ICD-10-CM | POA: Diagnosis present

## 2011-12-29 DIAGNOSIS — E119 Type 2 diabetes mellitus without complications: Secondary | ICD-10-CM | POA: Diagnosis present

## 2011-12-29 DIAGNOSIS — K5903 Drug induced constipation: Secondary | ICD-10-CM

## 2011-12-29 DIAGNOSIS — E1169 Type 2 diabetes mellitus with other specified complication: Secondary | ICD-10-CM | POA: Diagnosis present

## 2011-12-29 DIAGNOSIS — E876 Hypokalemia: Secondary | ICD-10-CM

## 2011-12-29 DIAGNOSIS — G894 Chronic pain syndrome: Secondary | ICD-10-CM | POA: Diagnosis present

## 2011-12-29 DIAGNOSIS — R112 Nausea with vomiting, unspecified: Secondary | ICD-10-CM

## 2011-12-29 DIAGNOSIS — Z7982 Long term (current) use of aspirin: Secondary | ICD-10-CM

## 2011-12-29 DIAGNOSIS — Z6838 Body mass index (BMI) 38.0-38.9, adult: Secondary | ICD-10-CM

## 2011-12-29 DIAGNOSIS — Z794 Long term (current) use of insulin: Secondary | ICD-10-CM

## 2011-12-29 DIAGNOSIS — Z86718 Personal history of other venous thrombosis and embolism: Secondary | ICD-10-CM

## 2011-12-29 DIAGNOSIS — R1110 Vomiting, unspecified: Secondary | ICD-10-CM

## 2011-12-29 DIAGNOSIS — F1120 Opioid dependence, uncomplicated: Secondary | ICD-10-CM | POA: Diagnosis present

## 2011-12-29 DIAGNOSIS — I2510 Atherosclerotic heart disease of native coronary artery without angina pectoris: Secondary | ICD-10-CM | POA: Diagnosis present

## 2011-12-29 DIAGNOSIS — K56609 Unspecified intestinal obstruction, unspecified as to partial versus complete obstruction: Secondary | ICD-10-CM

## 2011-12-29 DIAGNOSIS — K219 Gastro-esophageal reflux disease without esophagitis: Secondary | ICD-10-CM

## 2011-12-29 DIAGNOSIS — Z9861 Coronary angioplasty status: Secondary | ICD-10-CM

## 2011-12-29 DIAGNOSIS — K259 Gastric ulcer, unspecified as acute or chronic, without hemorrhage or perforation: Secondary | ICD-10-CM

## 2011-12-29 HISTORY — DX: Gastro-esophageal reflux disease without esophagitis: K21.9

## 2011-12-29 LAB — LACTIC ACID, PLASMA: Lactic Acid, Venous: 1 mmol/L (ref 0.5–2.2)

## 2011-12-29 LAB — BASIC METABOLIC PANEL
BUN: 6 mg/dL (ref 6–23)
Calcium: 9.4 mg/dL (ref 8.4–10.5)
GFR calc non Af Amer: >90 mL/min (ref >90)
Glucose, Bld: 192 mg/dL — ABNORMAL HIGH (ref 70–99)

## 2011-12-29 LAB — CBC WITH DIFFERENTIAL/PLATELET
Eosinophils Absolute: 0.1 10*3/uL (ref 0.0–0.7)
Eosinophils Relative: 2 % (ref 0–5)
Hemoglobin: 14.1 g/dL (ref 13.0–17.0)
Lymphs Abs: 2.2 10*3/uL (ref 0.7–4.0)
MCH: 31.5 pg (ref 26.0–34.0)
MCV: 94 fL (ref 78.0–100.0)
Monocytes Absolute: 0.5 10*3/uL (ref 0.1–1.0)
Monocytes Relative: 8 % (ref 3–12)
RBC: 4.47 MIL/uL (ref 4.22–5.81)

## 2011-12-29 MED ORDER — HYDROMORPHONE HCL PF 1 MG/ML IJ SOLN
1.0000 mg | INTRAMUSCULAR | Status: DC | PRN
  Administered 2011-12-29 – 2011-12-30 (×4): 1 mg via INTRAVENOUS
  Filled 2011-12-29 (×5): qty 1

## 2011-12-29 MED ORDER — HYDROMORPHONE HCL PF 1 MG/ML IJ SOLN: 1.0000 mg | Freq: Once | INTRAMUSCULAR | Status: DC

## 2011-12-29 MED ORDER — ONDANSETRON HCL 4 MG/2ML IJ SOLN
4.0000 mg | Freq: Three times a day (TID) | INTRAMUSCULAR | Status: DC | PRN
  Administered 2011-12-29: 4 mg via INTRAVENOUS
  Filled 2011-12-29: qty 2

## 2011-12-29 MED ORDER — SODIUM CHLORIDE 0.9 % IV BOLUS (SEPSIS)
1000.0000 mL | Freq: Once | INTRAVENOUS | Status: AC
  Administered 2011-12-29: 1000 mL via INTRAVENOUS

## 2011-12-29 MED ORDER — HYDROMORPHONE HCL PF 2 MG/ML IJ SOLN
2.0000 mg | Freq: Once | INTRAMUSCULAR | Status: AC
  Administered 2011-12-29: 2 mg via INTRAVENOUS
  Filled 2011-12-29: qty 1

## 2011-12-29 MED ORDER — METOCLOPRAMIDE HCL 5 MG/ML IJ SOLN
10.0000 mg | Freq: Once | INTRAMUSCULAR | Status: AC
  Administered 2011-12-29: 10 mg via INTRAVENOUS
  Filled 2011-12-29: qty 2

## 2011-12-29 MED ORDER — LORAZEPAM 2 MG/ML IJ SOLN
1.0000 mg | Freq: Once | INTRAMUSCULAR | Status: AC
  Administered 2011-12-29: 1 mg via INTRAVENOUS
  Filled 2011-12-29: qty 1

## 2011-12-29 NOTE — ED Notes (Signed)
Pt given juice for PO challenge.

## 2011-12-29 NOTE — ED Notes (Signed)
Pt reports continued abdominal pain. Pt has been seen here for same. States meds given yesterday did not help. Pt reports nausea and vomiting.

## 2011-12-29 NOTE — ED Notes (Signed)
Pt given 8 oz juice, vomited 4 oz 30 min later.

## 2011-12-29 NOTE — ED Provider Notes (Signed)
Medical screening examination/treatment/procedure(s) were conducted as a shared visit with non-physician practitioner(s) and myself.  I personally evaluated the patient during the encounter  Shaundra Fullam, MD 12/29/11 0753 

## 2011-12-29 NOTE — ED Provider Notes (Signed)
History     CSN: [id number]  Arrival date & time 12/29/11  1106   First MD Initiated Contact with Patient 12/29/11 1745      Chief Complaint  Patient presents with  . Abdominal Pain    Patient is a 61 y.o. male presenting with abdominal pain. The history is provided by the patient.  Abdominal Pain The primary symptoms of the illness include abdominal pain, nausea and vomiting. The primary symptoms of the illness do not include fever, shortness of breath or diarrhea. The current episode started more than 2 days ago. The onset of the illness was gradual. The problem has been gradually worsening.  Symptoms associated with the illness do not include constipation or back pain.  pt presents with worsening of his abdominal pain, nausea and vomiting He also reports dark stools, but no blood in stool No blood in vomitus No CP/SOB He reports pain is similar to prior episodes  Past Medical History  Diagnosis Date  . Hypertension   . Diabetes mellitus   . DVT (deep venous thrombosis)   . Chronic back pain   . 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 Hyperlipidemia   . CAD (coronary atherosclerotic disease)     s/p stents x2  . Small bowel obstruction   . Pancreatitis   . Anxiety     Past Surgical History  Procedure Date  . Coronary angioplasty with stent placement   . Tonsillectomy and adenoidectomy   . Cholecystectomy   . Back surgery   . Esophagogastroduodenoscopy 09/22/2011    Procedure: ESOPHAGOGASTRODUODENOSCOPY (EGD);  Surgeon: Charna Elizabeth, MD;  Location: [facility] ENDOSCOPY;  Service: Endoscopy;  Laterality: N/A;    Family History  Problem Relation Age of Onset  . Diabetes Father   . Colon cancer Neg Hx     History  Substance Use Topics  . Smoking status: Current Some Day Smoker -- 0.2 packs/day for 30 years    Types: Cigarettes  . Smokeless tobacco: Never Used  . Alcohol Use: Yes     occ      Review of Systems  Constitutional: Negative for  fever.  Respiratory: Negative for shortness of breath.   Gastrointestinal: Positive for nausea, vomiting and abdominal pain. Negative for diarrhea and constipation.  Musculoskeletal: Negative for back pain.  All other systems reviewed and are negative.    Allergies  Lisinopril and Morphine and related  Home Medications   Current Outpatient Rx  Name Route Sig Dispense Refill  . ALPRAZOLAM 1 MG PO TABS Oral Take 1 tablet (1 mg total) by mouth 2 (two) times daily. 10 tablet 0  . ASPIRIN EC 81 MG PO TBEC Oral Take 81 mg by mouth daily.    . DULOXETINE HCL 30 MG PO CPEP Oral Take 1 capsule (30 mg total) by mouth daily.    Marland Kitchen GABAPENTIN 400 MG PO CAPS Oral Take 400 mg by mouth 3 (three) times daily.    Marland Kitchen GLIPIZIDE ER 10 MG PO TB24 Oral Take 10 mg by mouth 2 (two) times daily.    . INSULIN ASPART 100 UNIT/ML [place] SOLN Subcutaneous Inject 15 Units into the skin 3 (three) times daily with meals.    . INSULIN GLARGINE 100 UNIT/ML [place] SOLN Subcutaneous Inject 10 Units into the skin at bedtime.    Marland Kitchen LACTULOSE 10 GM/15ML PO SOLN Oral Take 20 g by mouth 2 (two) times daily.    Marland Kitchen LOSARTAN POTASSIUM 100 MG PO TABS Oral  Take 100 mg by mouth daily.    Marland Kitchen METFORMIN HCL 850 MG PO TABS Oral Take 850 mg by mouth 3 (three) times daily.    Marland Kitchen METOCLOPRAMIDE HCL 10 MG PO TABS Oral Take 10 mg by mouth 4 (four) times daily as needed. For nausea     . ONDANSETRON HCL 4 MG PO TABS Oral Take 4 mg by mouth every 6 (six) hours as needed. For nausea    . OXYCODONE HCL 30 MG PO TABS Oral Take 30 mg by mouth every 4 (four) hours as needed. For breakthrough pain    . OXYCODONE HCL ER 60 MG PO TB12 Oral Take 1 tablet (60 mg total) by mouth every 12 (twelve) hours. 10 tablet 0  . ZANTAC PO Oral Take 300 mg by mouth daily.     . SENNA-DOCUSATE SODIUM 8.6-50 MG PO TABS Oral Take 1 tablet by mouth 2 (two) times daily as needed. For constipation      BP 145/96  Pulse 85  Temp 98.7 F (37.1 C) (Oral)  Resp 16  SpO2  100%  Physical Exam CONSTITUTIONAL: Well developed/well nourished HEAD AND FACE: Normocephalic/atraumatic EYES: EOMI/PERRL ENMT: Mucous membranes moist NECK: supple no meningeal signs SPINE:entire spine nontender CV: S1/S2 noted, no murmurs/rubs/gallops noted LUNGS: Lungs are clear to auscultation bilaterally, no apparent distress ABDOMEN: soft, obese diffuse tenderness,  Tenderness is moderate, no rebound or guarding Rectal - stool color normal, no melena, chaperone present, no masses noted, prostate not felt GU:no cva tenderness NEURO: Pt is awake/alert, moves all extremitiesx4 EXTREMITIES: pulses normal, full ROM SKIN: warm, color normal PSYCH: no abnormalities of mood noted  ED Course  Procedures   Labs Reviewed  GLUCOSE, CAPILLARY - Abnormal; Notable for the following:    Glucose-Capillary 179 (*)     All other components within normal limits  OCCULT BLOOD, POC DEVICE  CBC WITH DIFFERENTIAL  BASIC METABOLIC PANEL  LIPASE, BLOOD    6:37 PM Pt with chronic abd pain with worsening vomiting Will check labs and reassess.  Will defer imaging as just had CT on 8/23 Also has had workup with GI previously with EGD that shows esophagitis Pt requested 2mg  dilaudid due to high pain tolerance Pt has had normal gastric empyting studies previously  8:10 PM Abdomen soft, no focal tenderness, no hernia noted Will hydrate, check lactate If he is able to take PO and lactate normal, stable for d/c and close outpatient followup Will place in CDU  MDM  Nursing notes including past medical history and social history reviewed and considered in documentation Labs/vital reviewed and considered Previous records reviewed and considered     Date: 12/29/2011  Rate: 69  Rhythm: normal sinus rhythm  QRS Axis: normal  Intervals: normal  ST/T Wave abnormalities: normal  Conduction Disutrbances:first-degree A-V block   Narrative Interpretation:   Old EKG Reviewed:  unchanged       Joya Gaskins, MD 12/29/11 2011

## 2011-12-29 NOTE — ED Provider Notes (Signed)
Medical screening examination/treatment/procedure(s) were conducted as a shared visit with non-physician practitioner(s) and myself.  I personally evaluated the patient during the encounter  Cheri Guppy, MD 12/29/11 [phone]

## 2011-12-29 NOTE — ED Provider Notes (Signed)
9:02 PM BP 150/84  Pulse 85  Temp 98.1 F (36.7 C) (Oral)  Resp 18  SpO2 98% Accepted care of the patient in CDU from Dr. Bebe Shaggy. Patient has a history of chronic abdominal pain. He was seen 2 days ago here at ED. Abdominal pain has been worsening. He has had multiple and extensive workup for his pain. Currently awaiting return labs. Plan is to control pain and discharge. Patient when he is back to baseline Lactic acid is normal. This rules out the infarct from ischemia. Glucose is mildly elevated to 192. Signs have been stable. CV: RRR, No M/R/G, Peripheral pulses intact. No peripheral edema. Lungs: CTAB Abd: Soft, he is deep, diffusely tender to palpation. No masses, no guarding   10:34 PM Have repeated patient's pain medication. We did a by mouth challenge. He drank 8 ounces of juice was only able to keep down approximately 4.   11:19 PM Patient is continuously requesting pain medication. He is very been given 2 mg hydromorphone in the CDU. He states that his pain went from an 8 to a 7. He has had 2 by mouth challenges at this point and still continues to vomit. Although he denies nausea.   11:47 PM Patient has gotten another milligram of Dilaudid. He says his pain is better controlled, however, he has had 2 by mouth challenges with juice, one by mouth challenge with ice shifts and still continues to vomit. I relayed this information to Dr. Bebe Shaggy , who agrees it. Patient should be admitted for control of continued emesis.  Arthor Captain, PA-C 12/29/11 2348

## 2011-12-29 NOTE — ED Notes (Signed)
Pt updated on poc. Pt is pleasantly waiting on room. Pt with no needs at this time.

## 2011-12-30 ENCOUNTER — Observation Stay (HOSPITAL_COMMUNITY): Payer: Medicare Other

## 2011-12-30 ENCOUNTER — Encounter (HOSPITAL_COMMUNITY): Payer: Self-pay | Admitting: Internal Medicine

## 2011-12-30 DIAGNOSIS — K861 Other chronic pancreatitis: Secondary | ICD-10-CM

## 2011-12-30 DIAGNOSIS — I2510 Atherosclerotic heart disease of native coronary artery without angina pectoris: Secondary | ICD-10-CM

## 2011-12-30 LAB — COMPREHENSIVE METABOLIC PANEL
Alkaline Phosphatase: 69 U/L (ref 39–117)
BUN: 6 mg/dL (ref 6–23)
CO2: 25 mEq/L (ref 19–32)
Chloride: 106 mEq/L (ref 96–112)
Creatinine, Ser: 0.62 mg/dL (ref 0.50–1.35)
GFR calc Af Amer: >90 mL/min (ref >90)
GFR calc non Af Amer: >90 mL/min (ref >90)
Glucose, Bld: 228 mg/dL — ABNORMAL HIGH (ref 70–99)
Potassium: 3.8 mEq/L (ref 3.5–5.1)
Total Bilirubin: 0.4 mg/dL (ref 0.3–1.2)

## 2011-12-30 LAB — CBC WITH DIFFERENTIAL/PLATELET
Basophils Absolute: 0 10*3/uL (ref 0.0–0.1)
Eosinophils Absolute: 0.2 10*3/uL (ref 0.0–0.7)
Eosinophils Relative: 3 % (ref 0–5)
Lymphocytes Relative: 41 % (ref 12–46)
Lymphs Abs: 2.8 10*3/uL (ref 0.7–4.0)
Neutrophils Relative %: 50 % (ref 43–77)
Platelets: 198 10*3/uL (ref 150–400)
RBC: 4.54 MIL/uL (ref 4.22–5.81)
RDW: 13.8 % (ref 11.5–15.5)
WBC: 6.9 10*3/uL (ref 4.0–10.5)

## 2011-12-30 LAB — GLUCOSE, CAPILLARY
Glucose-Capillary: 154 mg/dL — ABNORMAL HIGH (ref 70–99)
Glucose-Capillary: 127 mg/dL — ABNORMAL HIGH (ref 70–99)

## 2011-12-30 LAB — LIPASE, BLOOD: Lipase: 66 U/L — ABNORMAL HIGH (ref 11–59)

## 2011-12-30 LAB — TROPONIN I: Troponin I: <0.3 ng/mL (ref <0.30)

## 2011-12-30 MED ORDER — OXYCODONE HCL 5 MG PO TABS
30.0000 mg | ORAL_TABLET | ORAL | Status: DC | PRN
  Administered 2011-12-30 – 2012-01-03 (×14): 30 mg via ORAL
  Filled 2011-12-30: qty 6
  Filled 2011-12-30: qty 1
  Filled 2011-12-30 (×6): qty 6
  Filled 2011-12-30: qty 5
  Filled 2011-12-30 (×2): qty 6
  Filled 2011-12-30: qty 1
  Filled 2011-12-30 (×2): qty 6
  Filled 2011-12-30: qty 1
  Filled 2011-12-30 (×2): qty 6
  Filled 2011-12-30: qty 5

## 2011-12-30 MED ORDER — ONDANSETRON HCL 4 MG/2ML IJ SOLN
4.0000 mg | Freq: Four times a day (QID) | INTRAMUSCULAR | Status: DC | PRN
  Administered 2011-12-30 – 2012-01-01 (×2): 4 mg via INTRAVENOUS
  Filled 2011-12-30 (×2): qty 2

## 2011-12-30 MED ORDER — GLIPIZIDE ER 10 MG PO TB24
10.0000 mg | ORAL_TABLET | Freq: Two times a day (BID) | ORAL | Status: DC
  Administered 2011-12-30 – 2011-12-31 (×3): 10 mg via ORAL
  Filled 2011-12-30 (×4): qty 1

## 2011-12-30 MED ORDER — OXYCODONE HCL 15 MG PO TB12
60.0000 mg | ORAL_TABLET | Freq: Two times a day (BID) | ORAL | Status: DC
  Administered 2011-12-30 – 2012-01-02 (×8): 60 mg via ORAL
  Filled 2011-12-30 (×9): qty 4

## 2011-12-30 MED ORDER — ONDANSETRON HCL 4 MG PO TABS: 4.0000 mg | ORAL_TABLET | Freq: Four times a day (QID) | ORAL | Status: DC | PRN

## 2011-12-30 MED ORDER — PANTOPRAZOLE SODIUM 40 MG PO TBEC
40.0000 mg | DELAYED_RELEASE_TABLET | Freq: Two times a day (BID) | ORAL | Status: DC
  Administered 2011-12-30 – 2012-01-06 (×15): 40 mg via ORAL
  Filled 2011-12-30 (×13): qty 1

## 2011-12-30 MED ORDER — ACETAMINOPHEN 325 MG PO TABS
650.0000 mg | ORAL_TABLET | Freq: Four times a day (QID) | ORAL | Status: DC | PRN
  Administered 2012-01-01: 650 mg via ORAL
  Filled 2011-12-30: qty 2

## 2011-12-30 MED ORDER — ACETAMINOPHEN 650 MG RE SUPP: 650.0000 mg | Freq: Four times a day (QID) | RECTAL | Status: DC | PRN

## 2011-12-30 MED ORDER — ALPRAZOLAM 0.5 MG PO TABS
1.0000 mg | ORAL_TABLET | Freq: Two times a day (BID) | ORAL | Status: DC
  Administered 2011-12-30 – 2012-01-06 (×15): 1 mg via ORAL
  Filled 2011-12-30 (×2): qty 2
  Filled 2011-12-30 (×3): qty 1
  Filled 2011-12-30 (×2): qty 2
  Filled 2011-12-30: qty 1
  Filled 2011-12-30 (×7): qty 2
  Filled 2011-12-30 (×3): qty 1

## 2011-12-30 MED ORDER — SODIUM CHLORIDE 0.9 % IV SOLN
INTRAVENOUS | Status: AC
  Administered 2011-12-30: 03:00:00 via INTRAVENOUS

## 2011-12-30 MED ORDER — LOSARTAN POTASSIUM 50 MG PO TABS
100.0000 mg | ORAL_TABLET | Freq: Every day | ORAL | Status: DC
  Administered 2011-12-30 – 2012-01-06 (×8): 100 mg via ORAL
  Filled 2011-12-30 (×8): qty 2

## 2011-12-30 MED ORDER — DULOXETINE HCL 30 MG PO CPEP
30.0000 mg | ORAL_CAPSULE | Freq: Every day | ORAL | Status: DC
  Administered 2011-12-30 – 2012-01-06 (×8): 30 mg via ORAL
  Filled 2011-12-30 (×8): qty 1

## 2011-12-30 MED ORDER — INSULIN ASPART 100 UNIT/ML ~~LOC~~ SOLN
0.0000 [IU] | Freq: Three times a day (TID) | SUBCUTANEOUS | Status: DC
  Administered 2011-12-30: 1 [IU] via SUBCUTANEOUS
  Administered 2011-12-30: 2 [IU] via SUBCUTANEOUS
  Administered 2011-12-30: 1 [IU] via SUBCUTANEOUS
  Administered 2011-12-31 – 2012-01-02 (×5): 2 [IU] via SUBCUTANEOUS
  Administered 2012-01-03: 3 [IU] via SUBCUTANEOUS
  Administered 2012-01-03 – 2012-01-04 (×3): 2 [IU] via SUBCUTANEOUS
  Administered 2012-01-04: 7 [IU] via SUBCUTANEOUS
  Administered 2012-01-04 – 2012-01-05 (×2): 5 [IU] via SUBCUTANEOUS
  Administered 2012-01-05: 9 [IU] via SUBCUTANEOUS
  Administered 2012-01-06: 3 [IU] via SUBCUTANEOUS
  Administered 2012-01-06: 5 [IU] via SUBCUTANEOUS

## 2011-12-30 MED ORDER — LACTULOSE 10 GM/15ML PO SOLN
20.0000 g | Freq: Two times a day (BID) | ORAL | Status: DC
  Administered 2011-12-30 – 2012-01-06 (×15): 20 g via ORAL
  Filled 2011-12-30 (×16): qty 30

## 2011-12-30 MED ORDER — ASPIRIN EC 81 MG PO TBEC
81.0000 mg | DELAYED_RELEASE_TABLET | Freq: Every day | ORAL | Status: DC
  Administered 2011-12-30 – 2012-01-06 (×8): 81 mg via ORAL
  Filled 2011-12-30 (×8): qty 1

## 2011-12-30 MED ORDER — FAMOTIDINE 20 MG PO TABS
20.0000 mg | ORAL_TABLET | Freq: Two times a day (BID) | ORAL | Status: DC
  Administered 2011-12-30 – 2012-01-06 (×15): 20 mg via ORAL
  Filled 2011-12-30 (×16): qty 1

## 2011-12-30 MED ORDER — GABAPENTIN 400 MG PO CAPS
400.0000 mg | ORAL_CAPSULE | Freq: Three times a day (TID) | ORAL | Status: DC
  Administered 2011-12-30 – 2012-01-06 (×23): 400 mg via ORAL
  Filled 2011-12-30 (×24): qty 1

## 2011-12-30 MED ORDER — METOCLOPRAMIDE HCL 10 MG PO TABS
10.0000 mg | ORAL_TABLET | Freq: Four times a day (QID) | ORAL | Status: DC | PRN
  Administered 2012-01-01 – 2012-01-02 (×2): 10 mg via ORAL
  Filled 2011-12-30 (×2): qty 1

## 2011-12-30 MED ORDER — OXYCODONE HCL 5 MG PO TABS
20.0000 mg | ORAL_TABLET | ORAL | Status: DC | PRN
  Administered 2011-12-30: 20 mg via ORAL
  Filled 2011-12-30: qty 4

## 2011-12-30 MED ORDER — INSULIN GLARGINE 100 UNIT/ML ~~LOC~~ SOLN
10.0000 [IU] | Freq: Every day | SUBCUTANEOUS | Status: DC
  Administered 2011-12-30: 10 [IU] via SUBCUTANEOUS

## 2011-12-30 MED ORDER — HYDROMORPHONE HCL PF 1 MG/ML IJ SOLN
0.5000 mg | INTRAMUSCULAR | Status: DC | PRN
  Administered 2011-12-30 (×2): 0.5 mg via INTRAVENOUS
  Filled 2011-12-30 (×2): qty 1

## 2011-12-30 NOTE — ED Provider Notes (Signed)
Medical screening examination/treatment/procedure(s) were conducted as a shared visit with non-physician practitioner(s) and myself.  I personally evaluated the patient during the encounter   Joya Gaskins, MD 12/30/11 0000

## 2011-12-30 NOTE — Progress Notes (Signed)
Pt seen and examined, admitted this am by Dr.K 60/M admitted with chronic abdominal pain Exam unremarkable >9 CT Abd/pelvis this year, unremarkable EGD in [place] with esophagitis Gastric emptying scan normal in June Suspect a component of narcotic dependency Plan: Supportive care Continue PPI BID DC IV dilaudid Resume oxycontin and oxycodone PRN per home dose Clear liq diet advance as tolerated Home soon  Ryan Cove, MD [phone]

## 2011-12-30 NOTE — Progress Notes (Signed)
Patient c/o nausea following supper of clear liquids. Pt. had eaten 100% of meal and did vomit 300cc. Prn nausea med given. Lurline Idol [facility]

## 2011-12-30 NOTE — H&P (Signed)
Ryan [name] is an 61 y.o. male.  Patient was seen and examined on December 30, 2011 at 1 AM. PCP - Dr. Horald Chestnut. Chief Complaint: Abdominal pain. HPI: 61 year-old male with history of CAD status post stenting, chronic pancreatitis, diabetes mellitus2, hypertension presented to the ER because of persistent abdominal pain. Patient has been having pain in his left lower quadrant with persistent nausea vomiting. It has been progressing worsening. In the ER patient was given multiple doses of pain relief medications and antiemetics despite which patient is complaining of pain and has been admitted for further management. Patient denies any chest pain or shortness of breath. Patient was admitted in last July 2 months ago and at that time extensive studies were done including CT abdomen and pelvis and gastric emptying study all of which were negative. 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  Past Medical History  Diagnosis Date  . Hypertension   . Diabetes mellitus   . DVT (deep venous thrombosis)   . Chronic back pain   . 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 Hyperlipidemia   . CAD (coronary atherosclerotic disease)     s/p stents x2  . Small bowel obstruction   . Pancreatitis   . Anxiety     Past Surgical History  Procedure Date  . Coronary angioplasty with stent placement   . Tonsillectomy and adenoidectomy   . Cholecystectomy   . Back surgery   . Esophagogastroduodenoscopy 09/22/2011    Procedure: ESOPHAGOGASTRODUODENOSCOPY (EGD);  Surgeon: Charna Elizabeth, MD;  Location: [facility] ENDOSCOPY;  Service: Endoscopy;  Laterality: N/A;    Family History  Problem Relation Age of Onset  . Diabetes Father   . Colon cancer Neg Hx    Social History:  reports that he has been smoking Cigarettes.  He has a 7.5 pack-year smoking history. He has never used smokeless tobacco. He reports that he drinks alcohol. He  reports that he does not use illicit drugs.  Allergies:  Allergies  Allergen Reactions  . Lisinopril Swelling and Rash  . Morphine And Related Swelling and Rash     (Not in a hospital admission)  Results for orders placed during the hospital encounter of 12/29/11 (from the past 48 hour(s))  GLUCOSE, CAPILLARY     Status: Abnormal   Collection Time   12/29/11  5:49 PM      Component Value Range Comment   Glucose-Capillary 179 (*) 70 - 99 mg/dL    Comment 1 Notify RN      Comment 2 Documented in Chart     OCCULT BLOOD, POC DEVICE     Status: Normal   Collection Time   12/29/11  6:19 PM      Component Value Range Comment   Fecal Occult Bld NEGATIVE     CBC WITH DIFFERENTIAL     Status: Normal   Collection Time   12/29/11  6:23 PM      Component Value Range Comment   WBC 6.5  4.0 - 10.5 K/uL    RBC 4.47  4.22 - 5.81 MIL/uL    Hemoglobin 14.1  13.0 - 17.0 g/dL    HCT 45.4  09.8 - 11.9 %    MCV 94.0  78.0 - 100.0 fL    MCH 31.5  26.0 - 34.0 pg    MCHC 33.6  30.0 - 36.0 g/dL    RDW 13.8  11.5 - 15.5 %    Platelets 198  150 - 400 K/uL    Neutrophils Relative 56  43 - 77 %    Neutro Abs 3.6  1.7 - 7.7 K/uL    Lymphocytes Relative 33  12 - 46 %    Lymphs Abs 2.2  0.7 - 4.0 K/uL    Monocytes Relative 8  3 - 12 %    Monocytes Absolute 0.5  0.1 - 1.0 K/uL    Eosinophils Relative 2  0 - 5 %    Eosinophils Absolute 0.1  0.0 - 0.7 K/uL    Basophils Relative 0  0 - 1 %    Basophils Absolute 0.0  0.0 - 0.1 K/uL   BASIC METABOLIC PANEL     Status: Abnormal   Collection Time   12/29/11  6:23 PM      Component Value Range Comment   Sodium 139  135 - 145 mEq/L    Potassium 3.8  3.5 - 5.1 mEq/L    Chloride 105  96 - 112 mEq/L    CO2 25  19 - 32 mEq/L    Glucose, Bld 192 (*) 70 - 99 mg/dL    BUN 6  6 - 23 mg/dL    Creatinine, Ser 8.65  0.50 - 1.35 mg/dL    Calcium 9.4  8.4 - 78.4 mg/dL    GFR calc non Af Amer >90  >90 mL/min    GFR calc Af Amer >90  >90 mL/min   LIPASE, BLOOD      Status: Normal   Collection Time   12/29/11  6:23 PM      Component Value Range Comment   Lipase 51  11 - 59 U/L   LACTIC ACID, PLASMA     Status: Normal   Collection Time   12/29/11  8:12 PM      Component Value Range Comment   Lactic Acid, Venous 1.0  0.5 - 2.2 mmol/L    No results found.  Review of Systems  Constitutional: Negative.   HENT: Negative.   Eyes: Negative.   Respiratory: Negative.   Cardiovascular: Negative.   Gastrointestinal: Positive for nausea, vomiting and abdominal pain.  Genitourinary: Negative.   Musculoskeletal: Negative.   Skin: Negative.   Neurological: Negative.   Endo/Heme/Allergies: Negative.   Psychiatric/Behavioral: Negative.     Blood pressure 134/64, pulse 75, temperature 98.1 F (36.7 C), temperature source Oral, resp. rate 16, SpO2 96.00%. Physical Exam  Constitutional: He is oriented to person, place, and time. He appears well-developed and well-nourished. No distress.  HENT:  Head: Normocephalic and atraumatic.  Right Ear: External ear normal.  Left Ear: External ear normal.  Nose: Nose normal.  Mouth/Throat: Oropharynx is clear and moist. No oropharyngeal exudate.  Eyes: Conjunctivae are normal. Pupils are equal, round, and reactive to light. Right eye exhibits no discharge. Left eye exhibits no discharge. No scleral icterus.  Neck: Normal range of motion. Neck supple.  Cardiovascular: Normal rate and regular rhythm.   Respiratory: Effort normal and breath sounds normal. No respiratory distress. He has no wheezes. He has no rales.  GI: Soft. Bowel sounds are normal. He exhibits no distension. There is no tenderness. There is no rebound and no guarding.  Musculoskeletal: Normal range of motion. He exhibits no edema and no tenderness.  Neurological: He is alert and oriented to person, place, and time.       Moves all extremities.  Skin: Skin is warm and dry. He is  not diaphoretic.     Assessment/Plan #1. Abdominal pain, acute on  chronic with history of chronic pancreatitis - patient will be admitted and placed on when necessary pain relief medication and clear liquid diet. I have ordered an acute abdominal series. Patient's abdomen on exam is benign. Recheck labs including LFTs and lipase. #2. CAD status post stenting - denies any chest pain. Continue home medications. #3. Diabetes mellitus type 2 - continue home medications. Closely follow CBG as patient's foot intake is not reliable due to nausea vomiting. #4. Hypertension - continue home medications.  CODE STATUS - full code.  KAKRAKANDY,ARSHAD N. 12/30/2011, 1:21 AM

## 2011-12-31 DIAGNOSIS — E119 Type 2 diabetes mellitus without complications: Secondary | ICD-10-CM

## 2011-12-31 LAB — GLUCOSE, CAPILLARY
Glucose-Capillary: 53 mg/dL — ABNORMAL LOW (ref 70–99)
Glucose-Capillary: 109 mg/dL — ABNORMAL HIGH (ref 70–99)
Glucose-Capillary: 152 mg/dL — ABNORMAL HIGH (ref 70–99)

## 2011-12-31 LAB — COMPREHENSIVE METABOLIC PANEL
ALT: 19 U/L (ref 0–53)
AST: 28 U/L (ref 0–37)
Albumin: 3.6 g/dL (ref 3.5–5.2)
Alkaline Phosphatase: 76 U/L (ref 39–117)
Potassium: 4.4 mEq/L (ref 3.5–5.1)
Sodium: 140 mEq/L (ref 135–145)
Total Protein: 7.1 g/dL (ref 6.0–8.3)

## 2011-12-31 LAB — LIPASE, BLOOD: Lipase: 155 U/L — ABNORMAL HIGH (ref 11–59)

## 2011-12-31 LAB — CBC
MCHC: 32.2 g/dL (ref 30.0–36.0)
Platelets: 214 10*3/uL (ref 150–400)
RDW: 13.8 % (ref 11.5–15.5)

## 2011-12-31 MED ORDER — INSULIN GLARGINE 100 UNIT/ML ~~LOC~~ SOLN
5.0000 [IU] | Freq: Every day | SUBCUTANEOUS | Status: DC
  Administered 2011-12-31 – 2012-01-04 (×5): 5 [IU] via SUBCUTANEOUS

## 2011-12-31 MED ORDER — GLIPIZIDE ER 5 MG PO TB24
5.0000 mg | ORAL_TABLET | Freq: Two times a day (BID) | ORAL | Status: DC
  Administered 2011-12-31 – 2012-01-01 (×2): 5 mg via ORAL
  Filled 2011-12-31 (×4): qty 1

## 2011-12-31 NOTE — Progress Notes (Addendum)
[facility]             Progress Note   Subjective: C/o pain and requesting his narcotics Q4, reports spitting up some  Objective: Vital signs in last 24 hours: Temp:  [97.5 F (36.4 C)-98.2 F (36.8 C)] 97.5 F (36.4 C) (09/03 0651) Pulse Rate:  [61-81] 81  (09/03 0651) Resp:  [18-20] 20  (09/03 0651) BP: (99-128)/(64-82) 107/76 mmHg (09/03 0651) SpO2:  [96 %-100 %] 97 % (09/03 0651) Weight change:     Intake/Output from previous day: 09/02 0701 - 09/03 0700 In: 1400 [P.O.:1400] Out: 1850 [Urine:1550; Emesis/NG output:300] Total I/O In: 260 [P.O.:260] Out: -    Physical Exam: General: Alert, awake, oriented x3, in no acute distress. HEENT: No bruits, no goiter. Heart: Regular rate and rhythm, without murmurs, rubs, gallops. Lungs: Clear to auscultation bilaterally. Abdomen: Soft, obese, nontender, nondistended, positive bowel sounds. Extremities: No clubbing cyanosis or edema with positive pedal pulses. Neuro: Grossly intact, nonfocal.    Lab Results: Basic Metabolic Panel:  Basename 12/31/11 0835 12/30/11 0253  NA 140 139  K 4.4 3.8  CL 104 106  CO2 28 25  GLUCOSE 85 228*  BUN 9 6  CREATININE 0.92 0.62  CALCIUM 9.4 9.5  MG -- --  PHOS -- --   Liver Function Tests:  Basename 12/31/11 0835 12/30/11 0253  AST 28 9  ALT 19 6  ALKPHOS 76 69  BILITOT 0.3 0.4  PROT 7.1 7.3  ALBUMIN 3.6 3.5    Basename 12/31/11 0835 12/30/11 0253  LIPASE 155* 66*  AMYLASE -- --   No results found for this basename: AMMONIA:2 in the last 72 hours CBC:  Basename 12/31/11 0835 12/30/11 0253 12/29/11 1823  WBC 5.2 6.9 --  NEUTROABS -- 3.4 3.6  HGB 14.5 14.3 --  HCT 45.0 42.9 --  MCV 97.2 94.5 --  PLT 214 198 --   Cardiac Enzymes:  Basename 12/30/11 0253  CKTOTAL --  CKMB --  CKMBINDEX --  TROPONINI <0.30   BNP: No results found for this basename: PROBNP:3 in the last 72 hours D-Dimer: No results found for this basename: DDIMER:2 in the  last 72 hours CBG:  Basename 12/31/11 0901 12/31/11 0809 12/30/11 2043 12/30/11 1705 12/30/11 1154 12/30/11 0747  GLUCAP 109* 53* 142* 129* 127* 154*   Hemoglobin A1C: No results found for this basename: HGBA1C in the last 72 hours Fasting Lipid Panel: No results found for this basename: CHOL,HDL,LDLCALC,TRIG,CHOLHDL,LDLDIRECT in the last 72 hours Thyroid Function Tests: No results found for this basename: TSH,T4TOTAL,FREET4,T3FREE,THYROIDAB in the last 72 hours Anemia Panel: No results found for this basename: VITAMINB12,FOLATE,FERRITIN,TIBC,IRON,RETICCTPCT in the last 72 hours Coagulation: No results found for this basename: LABPROT:2,INR:2 in the last 72 hours Urine Drug Screen: Drugs of Abuse     Component Value Date/Time   LABOPIA NONE DETECTED 10/17/2011 0637   LABOPIA NEGATIVE 11/13/2010 2022   COCAINSCRNUR NONE DETECTED 10/17/2011 0637   COCAINSCRNUR NEGATIVE 11/13/2010 2022   LABBENZ NONE DETECTED 10/17/2011 0637   LABBENZ POSITIVE* 11/13/2010 2022   AMPHETMU NONE DETECTED 10/17/2011 0637   AMPHETMU NEGATIVE 11/13/2010 2022   THCU NONE DETECTED 10/17/2011 0637   LABBARB NONE DETECTED 10/17/2011 4098    Alcohol Level: No results found for this basename: ETH:2 in the last 72 hours Urinalysis: No results found for this basename: COLORURINE:2,APPERANCEUR:2,LABSPEC:2,PHURINE:2,GLUCOSEU:2,HGBUR:2,BILIRUBINUR:2,KETONESUR:2,PROTEINUR:2,UROBILINOGEN:2,NITRITE:2,LEUKOCYTESUR:2 in the last 72 hours  Recent Results (from the past 240 hour(s))  MRSA PCR SCREENING     Status: Normal  Collection Time   12/30/11  6:06 AM      Component Value Range Status Comment   MRSA by PCR NEGATIVE  NEGATIVE Final     Studies/Results: Dg Abd Acute W/chest  12/30/2011  *RADIOLOGY REPORT*  Clinical Data: Nausea, vomiting, abdominal pain, past history of gastric ulcers and cholecystectomy  ACUTE ABDOMEN SERIES (ABDOMEN 2 VIEW & CHEST 1 VIEW)  Comparison: Chest radiograph 12/25/2011, abdominal radiographs  of 11/01/2011  Findings: Normal heart size, mediastinal contours, and pulmonary vascularity. Chronic perihilar and basilar lung changes without acute infiltrate or effusion. Nonobstructive bowel gas pattern. No bowel dilatation, bowel wall thickening or free intraperitoneal air. Prior L4-L5 lumbar fusion. Surgical clips right upper quadrant question cholecystectomy. No definite urinary tract calcification.  IMPRESSION: No acute abnormalities.   Original Report Authenticated By: Lollie Marrow, M.D.     Medications: Scheduled Meds:   . ALPRAZolam  1 mg Oral BID  . aspirin EC  81 mg Oral Daily  . DULoxetine  30 mg Oral Daily  . famotidine  20 mg Oral BID  . gabapentin  400 mg Oral TID  . glipiZIDE  5 mg Oral BID AC  . insulin aspart  0-9 Units Subcutaneous TID WC  . insulin glargine  5 Units Subcutaneous QHS  . lactulose  20 g Oral BID  . losartan  100 mg Oral Daily  . oxyCODONE  60 mg Oral Q12H  . pantoprazole  40 mg Oral BID AC  . DISCONTD: glipiZIDE  10 mg Oral BID  . DISCONTD: insulin glargine  10 Units Subcutaneous QHS   Continuous Infusions:   . sodium chloride 50 mL/hr at 12/30/11 1253   PRN Meds:.acetaminophen, acetaminophen, metoCLOPramide, ondansetron (ZOFRAN) IV, ondansetron, oxyCODONE, DISCONTD: oxyCODONE  Assessment/Plan: #1. Abdominal pain,  acute on chronic with history of chronic pancreatitis -  Exam unremarkable  >9 CT Abd/pelvis this year, unremarkable  EGD in [place] with esophagitis  Gastric emptying scan normal in June  Suspect a component of narcotic dependency  Plan:  Supportive care  Continue PPI BID  DCed IV dilaudid  Resume oxycontin and oxycodone PRN per home dose  Advance to full liq diet today  #2. CAD status post stenting - denies any chest pain. Continue home medications.   #3. Diabetes mellitus type 2 - with hypoglycemia: cut down lantus and cut glipizide in half   #4. Hypertension - continue home medications.  Ambulate/PT eval Home hopefully  tomorrow    LOS: 2 days   [facility] Pager: [phone] 12/31/2011, 10:37 AM

## 2011-12-31 NOTE — Progress Notes (Signed)
Utilization review complete 

## 2011-12-31 NOTE — Progress Notes (Addendum)
Pt ate all of his full liq lunch (plus an [place] ice), but also vomited small amounts three times following his lunch (total of about 300 mls emesis)

## 2011-12-31 NOTE — Progress Notes (Signed)
Hypoglycemic Event  CBG: 53  Treatment: 15 GM carbohydrate snack  Symptoms: None  Follow-up CBG: Time:0901 CBG Result:109  Possible Reasons for Event: Inadequate meal intake  Comments/MD notified:Dr Jomarie Longs - no new orders    Ryan [name], [name]  Remember to initiate Hypoglycemia Order Set & complete

## 2011-12-31 NOTE — ED Provider Notes (Signed)
Medical screening examination/treatment/procedure(s) were performed by non-physician practitioner and as supervising physician I was immediately available for consultation/collaboration.  Jemmie Rhinehart, MD 12/31/11 1752 

## 2011-12-31 NOTE — Evaluation (Signed)
Physical Therapy Evaluation Patient Details Name: Ryan [name] MRN: [id number] DOB: [birth year] Today's Date: 12/31/2011 Time: 6213-0865 PT Time Calculation (min): 34 min  PT Assessment / Plan / Recommendation Clinical Impression  Pt admitted with abdominal pain with chronic pancreatitis. Pt with mobility near his baseline with decreased ambulation and gait speed and increased fall risk. Pt reports several falls this year but unable to state when but aware they may be due to his narcotic use. Pt will benefit from acute therapy to maximize mobility prior to discharge to return him to PLOF.    PT Assessment  Patient needs continued PT services    Follow Up Recommendations  Home health PT    Barriers to Discharge Decreased caregiver support      Equipment Recommendations  None recommended by PT    Recommendations for Other Services     Frequency Min 3X/week    Precautions / Restrictions Precautions Precautions: Fall   Pertinent Vitals/Pain 3/10 pain abdominal      Mobility  Bed Mobility Bed Mobility: Supine to Sit Supine to Sit: 6: Modified independent (Device/Increase time);HOB flat;With rails Transfers Transfers: Sit to Stand;Stand to Sit Sit to Stand: 5: Supervision;From bed Stand to Sit: 5: Supervision;To chair/3-in-1;With armrests Details for Transfer Assistance: cueing for hand placement Ambulation/Gait Ambulation/Gait Assistance: 5: Supervision Ambulation Distance (Feet): 150 Feet Assistive device: Rolling walker Ambulation/Gait Assistance Details: cueing for posture and to step into RW Gait Pattern: Step-through pattern;Decreased stride length Gait velocity: decreased    Exercises     PT Diagnosis: Difficulty walking  PT Problem List: Decreased activity tolerance;Decreased knowledge of use of DME PT Treatment Interventions: DME instruction;Gait training;Functional mobility training;Therapeutic activities;Therapeutic exercise;Patient/family education;Balance  training   PT Goals Acute Rehab PT Goals PT Goal Formulation: With patient Time For Goal Achievement: 01/14/12 Potential to Achieve Goals: Fair Pt will go Sit to Stand: with modified independence PT Goal: Sit to Stand - Progress: Goal set today Pt will go Stand to Sit: with modified independence PT Goal: Stand to Sit - Progress: Goal set today Pt will Ambulate: >150 feet;with modified independence;with least restrictive assistive device PT Goal: Ambulate - Progress: Goal set today Pt will Go Up / Down Stairs: 3-5 stairs;with rail(s);with supervision PT Goal: Up/Down Stairs - Progress: Goal set today  Visit Information  Last PT Received On: 12/31/11 Assistance Needed: +1    Subjective Data  Subjective: I just finished HHPT a week ago Patient Stated Goal: return home   Prior Functioning  Home Living Lives With: Friend(s) Available Help at Discharge: Friend(s);Available PRN/intermittently Type of Home: Mobile home Home Access: Stairs to enter Entrance Stairs-Number of Steps: 4 Entrance Stairs-Rails: Right;Left;Can reach both Home Layout: One level Bathroom Shower/Tub: Engineer, manufacturing systems: Standard Home Adaptive Equipment: Environmental consultant - four wheeled;Shower chair with back Prior Function Level of Independence: Needs assistance Needs Assistance: Light Housekeeping;Meal Prep Meal Prep: Minimal Light Housekeeping: Minimal Able to Take Stairs?: Yes Driving: No Vocation: Unemployed Communication Communication: No difficulties    Cognition  Overall Cognitive Status: Appears within functional limits for tasks assessed/performed Arousal/Alertness: Awake/alert Orientation Level: Appears intact for tasks assessed Behavior During Session: [facility] for tasks performed    Extremity/Trunk Assessment Right Upper Extremity Assessment RUE ROM/Strength/Tone: [facility] for tasks assessed Left Upper Extremity Assessment LUE ROM/Strength/Tone: WFL for tasks assessed Right Lower Extremity  Assessment RLE ROM/Strength/Tone: WFL for tasks assessed RLE Sensation: WFL - Light Touch RLE Coordination: WFL - gross/fine motor Left Lower Extremity Assessment LLE ROM/Strength/Tone: WFL for tasks assessed LLE  Sensation: WFL - Light Touch LLE Coordination: WFL - gross/fine motor Trunk Assessment Trunk Assessment: Normal   Balance Static Sitting Balance Static Sitting - Balance Support: No upper extremity supported;Feet supported Static Sitting - Level of Assistance: 7: Independent Static Sitting - Comment/# of Minutes: 6  End of Session PT - End of Session Equipment Utilized During Treatment: Gait belt Activity Tolerance: Patient tolerated treatment well Patient left: in chair;with call bell/phone within reach Nurse Communication: Mobility status  GP     [name] 12/31/2011, 4:48 PM  [name], PT [phone]

## 2012-01-01 DIAGNOSIS — F1920 Other psychoactive substance dependence, uncomplicated: Secondary | ICD-10-CM

## 2012-01-01 DIAGNOSIS — R112 Nausea with vomiting, unspecified: Secondary | ICD-10-CM

## 2012-01-01 DIAGNOSIS — R933 Abnormal findings on diagnostic imaging of other parts of digestive tract: Secondary | ICD-10-CM

## 2012-01-01 DIAGNOSIS — R109 Unspecified abdominal pain: Secondary | ICD-10-CM

## 2012-01-01 DIAGNOSIS — R1115 Cyclical vomiting syndrome unrelated to migraine: Secondary | ICD-10-CM

## 2012-01-01 LAB — GLUCOSE, CAPILLARY
Glucose-Capillary: 65 mg/dL — ABNORMAL LOW (ref 70–99)
Glucose-Capillary: 67 mg/dL — ABNORMAL LOW (ref 70–99)
Glucose-Capillary: 71 mg/dL (ref 70–99)

## 2012-01-01 LAB — CBC
HCT: 40.9 % (ref 39.0–52.0)
MCH: 31.1 pg (ref 26.0–34.0)
MCHC: 32.5 g/dL (ref 30.0–36.0)
MCV: 95.6 fL (ref 78.0–100.0)
Platelets: 235 10*3/uL (ref 150–400)
RDW: 13.5 % (ref 11.5–15.5)

## 2012-01-01 MED ORDER — PROMETHAZINE HCL 25 MG/ML IJ SOLN: 12.5000 mg | Freq: Four times a day (QID) | INTRAMUSCULAR | Status: DC | PRN

## 2012-01-01 MED ORDER — PROMETHAZINE HCL 12.5 MG RE SUPP: 12.5000 mg | Freq: Four times a day (QID) | RECTAL | Status: DC | PRN

## 2012-01-01 MED ORDER — ONDANSETRON HCL 4 MG PO TABS
4.0000 mg | ORAL_TABLET | Freq: Four times a day (QID) | ORAL | Status: DC
  Administered 2012-01-01 – 2012-01-06 (×16): 4 mg via ORAL
  Filled 2012-01-01 (×26): qty 1

## 2012-01-01 MED ORDER — PROMETHAZINE HCL 25 MG PO TABS
12.5000 mg | ORAL_TABLET | Freq: Four times a day (QID) | ORAL | Status: DC | PRN
  Administered 2012-01-05 – 2012-01-06 (×4): 12.5 mg via ORAL
  Filled 2012-01-01 (×4): qty 1

## 2012-01-01 NOTE — Consult Note (Signed)
Referring Provider: No ref. provider found Primary Care Physician:  Warrick Parisian, MD Primary Gastroenterologist:  Dr. Marina Goodell  Reason for Consultation:  Chronic abdominal pain, nausea and vomiting  HPI: Ryan [name] is a 61 y.o. male with multiple medical problems including hypertension, insulin requiring diabetes mellitus, morbid obesity, coronary artery disease with prior stent placement, chronic back pain with chronic pain syndrome requiring chronic narcotics, and anxiety. He was admitted to [facility] hospital with complaints of persistent abdominal pain with nausea and vomiting.  He was recently seen in our office by Dr. Marina Goodell on 8/15, and his office visit outlines the patient's ongoing issues.  "He is sent today regarding the need for followup endoscopy. He was hospitalized in May of 2013 with complaints of abdominal pain, nausea/vomiting. He was evaluated in consultation by Dr. Loreta Ave. Imaging studies at that time included CT scan of the abdomen and pelvis as well as gastric emptying study. These were unremarkable. He subsequently underwent upper endoscopy with Dr. Loreta Ave on 09/22/2011. Examination revealed erosive esophagitis, ulcers mass at the gastroesophageal junction, prominent antral fold, and possible polypoid lesion of the ampulla. Biopsies revealed nonspecific inflammation. Patient was instructed to avoid NSAIDs and began on PPI therapy. Followup endoscopy in 8 weeks recommended. Patient tells me that he has been compliant with PPI therapy. He also takes H2 receptor antagonist at night. He denies active GERD symptoms. He denies NSAID use. He states that he continues with the same abdominal pain as well as occasional problems with nausea and vomiting. Unchanged from hospital stay. No sustained weight loss. GI review of systems is otherwise negative. He apparently carries the diagnosis of pancreatitis, though this has not been substantiated, as best I can tell. He is status post cholecystectomy at the  [facility] in [place] 3-4 years ago. He has had GI evaluations, by his report, outside [place].  He reports that his multiple chronic medical problems are stable."  He was scheduled for repeat EGD on 9/17.  Since his visit with Dr. Marina Goodell he has been in the ED on multiple occasions with the exact same symptoms.  Another CT scan was performed that showed mild main pancreatic and central biliary ductal prominence to the level of soft tissue fullness at the ampulla with MRI/MRCP recommended.  These findings were not commented on in the multiple other CT scan reports.  He reports that these symptoms are the same symptoms that he experienced in May, however, he reports that they subsided for a while, but returned "with a vengeance".  Abdominal pain is constant, mostly in the epigastrium and LUQ and is described as a scraping/stabbing pain.  Nausea and vomiting comes in bouts, but occurs multiple times a day.  Only relief is if he remains sitting upright; symptoms do not completely resolve, but improve.  Just ate a lunch tray of full liquids and has tolerated those so far.  Says that overall his appetite is poor and he has lost 20ish pounds over the last couple of months, according to his subjective report.  Has NOT been taking PPI recently despite his instructions; says that he took them for a while, but is now just taking ranitidine.  Just of note, he says that he has never undergone a colonoscopy previously.  On this admission his labs are unremarkable except for a mildly elevated lipase at 155.  He was started on anti-emetics, his home pain meds, IVFs, and BID PPI with pepcid twice daily as well.  Past Medical History  Diagnosis Date  .  Hypertension   . Diabetes mellitus   . DVT (deep venous thrombosis)   . Chronic back pain   . 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 Hyperlipidemia   . CAD (coronary atherosclerotic disease)     s/p stents x2  . Small bowel obstruction     . Pancreatitis   . Anxiety     Past Surgical History  Procedure Date  . Coronary angioplasty with stent placement   . Tonsillectomy and adenoidectomy   . Cholecystectomy   . Back surgery   . Esophagogastroduodenoscopy 09/22/2011    Procedure: ESOPHAGOGASTRODUODENOSCOPY (EGD);  Surgeon: Charna Elizabeth, MD;  Location: [facility] ENDOSCOPY;  Service: Endoscopy;  Laterality: N/A;    Prior to Admission medications   Medication Sig Start Date End Date Taking? Authorizing Provider  ALPRAZolam Prudy Feeler) 1 MG tablet Take 1 tablet (1 mg total) by mouth 2 (two) times daily. 11/04/11  Yes Shanker Levora Dredge, MD  aspirin EC 81 MG tablet Take 81 mg by mouth daily.   Yes Historical Provider, MD  DULoxetine (CYMBALTA) 30 MG capsule Take 1 capsule (30 mg total) by mouth daily. 11/04/11 11/03/12 Yes Shanker Levora Dredge, MD  gabapentin (NEURONTIN) 400 MG capsule Take 400 mg by mouth 3 (three) times daily.   Yes Historical Provider, MD  glipiZIDE (GLUCOTROL XL) 10 MG 24 hr tablet Take 10 mg by mouth 2 (two) times daily.   Yes Historical Provider, MD  insulin aspart (NOVOLOG) 100 UNIT/ML injection Inject 15 Units into the skin 3 (three) times daily with meals. 11/04/11 11/03/12 Yes Shanker Levora Dredge, MD  insulin glargine (LANTUS) 100 UNIT/ML injection Inject 10 Units into the skin at bedtime. 10/22/11 10/21/12 Yes Osvaldo Shipper, MD  lactulose (CHRONULAC) 10 GM/15ML solution Take 20 g by mouth 2 (two) times daily.   Yes Historical Provider, MD  losartan (COZAAR) 100 MG tablet Take 100 mg by mouth daily.   Yes Historical Provider, MD  metFORMIN (GLUCOPHAGE) 850 MG tablet Take 850 mg by mouth 3 (three) times daily.   Yes Historical Provider, MD  metoCLOPramide (REGLAN) 10 MG tablet Take 10 mg by mouth 4 (four) times daily as needed. For nausea  12/21/11 12/31/11 Yes Tobin Chad, MD  ondansetron (ZOFRAN) 4 MG tablet Take 4 mg by mouth every 6 (six) hours as needed. For nausea   Yes Historical Provider, MD  oxycodone (ROXICODONE) 30 MG  immediate release tablet Take 30 mg by mouth every 4 (four) hours as needed. For breakthrough pain   Yes Historical Provider, MD  Oxycodone HCl (OXYCONTIN) 60 MG TB12 Take 1 tablet (60 mg total) by mouth every 12 (twelve) hours. 11/04/11  Yes Shanker Levora Dredge, MD  Ranitidine HCl (ZANTAC PO) Take 300 mg by mouth daily.    Yes Historical Provider, MD  sennosides-docusate sodium (SENOKOT-S) 8.6-50 MG tablet Take 1 tablet by mouth 2 (two) times daily as needed. For constipation   Yes Historical Provider, MD    Current Facility-Administered Medications  Medication Dose Route Frequency Provider Last Rate Last Dose  . acetaminophen (TYLENOL) tablet 650 mg  650 mg Oral Q6H PRN Eduard Clos, MD       Or  . acetaminophen (TYLENOL) suppository 650 mg  650 mg Rectal Q6H PRN Eduard Clos, MD      . ALPRAZolam Prudy Feeler) tablet 1 mg  1 mg Oral BID Eduard Clos, MD   1 mg at 01/01/12 1023  . aspirin EC tablet 81 mg  81 mg Oral Daily Eduard Clos, MD   81 mg at 01/01/12 1023  . DULoxetine (CYMBALTA) DR capsule 30 mg  30 mg Oral Daily Eduard Clos, MD   30 mg at 01/01/12 1023  . famotidine (PEPCID) tablet 20 mg  20 mg Oral BID Eduard Clos, MD   20 mg at 01/01/12 1023  . gabapentin (NEURONTIN) capsule 400 mg  400 mg Oral TID Eduard Clos, MD   400 mg at 01/01/12 1023  . glipiZIDE (GLUCOTROL XL) 24 hr tablet 5 mg  5 mg Oral BID AC Zannie Cove, MD   5 mg at 01/01/12 0732  . insulin aspart (novoLOG) injection 0-9 Units  0-9 Units Subcutaneous TID WC Eduard Clos, MD   2 Units at 01/01/12 1243  . insulin glargine (LANTUS) injection 5 Units  5 Units Subcutaneous QHS Zannie Cove, MD   5 Units at 12/31/11 2107  . lactulose (CHRONULAC) 10 GM/15ML solution 20 g  20 g Oral BID Eduard Clos, MD   20 g at 01/01/12 1023  . losartan (COZAAR) tablet 100 mg  100 mg Oral Daily Eduard Clos, MD   100 mg at 01/01/12 1023  . metoCLOPramide (REGLAN) tablet 10 mg   10 mg Oral QID PRN Eduard Clos, MD      . ondansetron [facility]) tablet 4 mg  4 mg Oral Q6H PRN Eduard Clos, MD       Or  . ondansetron [facility]) injection 4 mg  4 mg Intravenous Q6H PRN Eduard Clos, MD   4 mg at 01/01/12 0857  . oxyCODONE (Oxy IR/ROXICODONE) immediate release tablet 30 mg  30 mg Oral Q4H PRN Zannie Cove, MD   30 mg at 01/01/12 1023  . oxyCODONE (OXYCONTIN) 12 hr tablet 60 mg  60 mg Oral Q12H Eduard Clos, MD   60 mg at 01/01/12 0146  . pantoprazole (PROTONIX) EC tablet 40 mg  40 mg Oral BID AC Zannie Cove, MD   40 mg at 01/01/12 0732    Allergies as of 12/29/2011 - Review Complete 12/29/2011  Allergen Reaction Noted  . Lisinopril Swelling and Rash 02/06/2011  . Morphine and related Swelling and Rash 02/06/2011    Family History  Problem Relation Age of Onset  . Diabetes Father   . Colon cancer Neg Hx     History   Social History  . Marital Status: Legally Separated    Spouse Name: N/A    Number of Children: 1  . Years of Education: N/A   Occupational History  . Disabled    Social History Main Topics  . Smoking status: Current Some Day Smoker -- 0.2 packs/day for 30 years    Types: Cigarettes  . Smokeless tobacco: Never Used  . Alcohol Use: Yes     occ  . Drug Use: No  . Sexually Active: No   Other Topics Concern  . Not on file   Social History Narrative  . No narrative on file    Review of Systems: Ten point ROS O/W negative except as mentioned in HPI.  Physical Exam: Vital signs in last 24 hours: Temp:  [97.5 F (36.4 C)-98.1 F (36.7 C)] 97.6 F (36.4 C) (09/04 0533) Pulse Rate:  [66-83] 66  (09/04 0533) Resp:  [18-20] 20  (09/04 0533) BP: (90-113)/(61-74) 113/72 mmHg (09/04 0533) SpO2:  [93 %-97 %] 97 % (09/04 0533) Weight:  [315 lb 4.1 oz (143 kg)-326 lb 11.6 oz (  148.2 kg)] 315 lb 4.1 oz (143 kg) (09/04 1028) Last BM Date: 12/31/11 General:   Alert, Well-developed, well-nourished, pleasant and  cooperative in NAD Head:  Normocephalic and atraumatic. Eyes:  Sclera clear, no icterus.  Conjunctiva pink. Ears:  Normal auditory acuity. Mouth:  No deformity or lesions.  Poor dentition. Lungs:  Clear throughout to auscultation.   No wheezes, crackles, or rhonchi. Heart:  Regular rate and rhythm; no murmurs, clicks, rubs,  or gallops. Abdomen:  Soft, obese, non-distended.  BS active, nonpalp mass or hsm.  TTP>epigastrum and LUQ   Rectal:  Deferred  Msk:  Symmetrical without gross deformities. Pulses:  Normal pulses noted. Extremities:  Without clubbing or edema. Neurologic:  Alert and  oriented x4;  grossly normal neurologically. Skin:  Intact without significant lesions or rashes. Psych:  Alert and cooperative. Depressed mood and affect.  Intake/Output from previous day: 09/03 0701 - 09/04 0700 In: 1460 [P.O.:1460] Out: 1003 [Urine:803; Emesis/NG output:200] Intake/Output this shift: Total I/O In: -  Out: 800 [Urine:500; Emesis/NG output:300]  Lab Results:  Lipase 155-chronically slightly elevated  Basename 01/01/12 0700 12/31/11 0835 12/30/11 0253  WBC 5.2 5.2 6.9  HGB 13.3 14.5 14.3  HCT 40.9 45.0 42.9  PLT 235 214 198   BMET  Basename 12/31/11 0835 12/30/11 0253 12/29/11 1823  NA 140 139 139  K 4.4 3.8 3.8  CL 104 106 105  CO2 28 25 25   GLUCOSE 85 228* 192*  BUN 9 6 6   CREATININE 0.92 0.62 0.67  CALCIUM 9.4 9.5 9.4   LFT  Basename 12/31/11 0835  PROT 7.1  ALBUMIN 3.6  AST 28  ALT 19  ALKPHOS 76  BILITOT 0.3  BILIDIR --  IBILI --   IMPRESSION:  #1.  Chronic abdominal pain with nausea and vomiting- unsure if his findings on previous endoscopy substantiate his complaints.  #2.  Upper endoscopy with esophagitis, proximal gastric ulcer, and questionable ampullary abnormality on EGD in 08/2011 #3.  CT scan abnormality with mild main pancreatic and central biliary ductal prominence to the level of soft tissue fullness at the ampulla #4.  Diagnosis of  chronic pancreatitis with chronically mildly elevated lipase, but no substantial findings on imaging, etc to confirm this diagnosis #5.  Chronic pain syndrome on chronic narcotics  #6.  Multiple medical problems including coronary artery disease and insulin requiring diabetes mellitus  PLAN: #1.  ? Repeat EGD #2.  ? MRI/MRCP vs EUS #3.  Continue BID PPI therapy #4.  Antiemetics and pain control  ZEHR, JESSICA D.  01/01/2012, 1:38 PM  Pager number [phone]   ________________________________________________________________________  Corinda G[facility] GI MD note:  I personally examined the patient, reviewed the data.  He has had 13 CT scans in the past 3 years for abdominal pains.  The biliary and pancreatic duct findings on recent CT are unimpressive to my reading and not new.  He has been taking oxycontin 60mg  pill, twice daily in addition to oxycodone 30mg  pill, 6 times a day (prescribed by his PCP).  This is an incredible dose of narcotic pain medicine and I think is very possible that some of his abd pains, vomiting is related to periodic withdrawal.  I recommend he try to cut back on these meds as best as possible.  He is currently scheduled for upper endoscopy with Dr. Marina Goodell in 2 weeks as an out patient with MAC sedation and knows he should keep that appointment.  I do not plan to perform any invasive testing this  admission.  Would treat with scheduled antiemetics, consider psych referral to narcotic dependence for now.   Rob Bunting, MD [facility] Gastroenterology Pager [phone]

## 2012-01-01 NOTE — Progress Notes (Signed)
Inpatient Diabetes Program Recommendations  AACE/ADA: New Consensus Statement on Inpatient Glycemic Control (2013)  Target Ranges:  Prepandial:   less than 140 mg/dL      Peak postprandial:   less than 180 mg/dL (1-2 hours)      Critically ill patients:  140 - 180 mg/dL   Reason for Visit: Results for Ryan [name], Ryan [name] (MRN [id number]) as of 01/01/2012 11:16  Ref. Range 12/31/2011 21:20 12/31/2011 22:11 01/01/2012 07:43 01/01/2012 08:02 01/01/2012 08:24  Glucose-Capillary Latest Range: 70-99 mg/dL 409 (H) 811 (H) 67 (L) 65 (L) 71   Note CBG's low.  Please consider holding Glucotrol while patient is in the hospital.  Also please check A1C to determine pre-hospitalization glycemic control.

## 2012-01-01 NOTE — Progress Notes (Signed)
Patient vomited 300 ml immediately after eating, complains of stabbing pain in left lower quadrant. States LBM 9/3.

## 2012-01-01 NOTE — Progress Notes (Signed)
Patient with cbg of 65, treated with 15 gram carb snack. Recheck sugar was 71, gave 8 oz juice. Patient remains asymptomatic, sitting on sign of bed. Dr. Betti Cruz notified via text page. Will continue to monitor.

## 2012-01-01 NOTE — Progress Notes (Signed)
Hypoglycemic Event  CBG: 65      Treatment: 15 GM carbohydrate snack  Symptoms: None   Follow-up CBG: [id number]:[id number] CBG Result:71   Possible Reasons for Event: Vomiting and Inadequate meal intake  Comments/MD notified: yes     Janit Bern  Remember to initiate Hypoglycemia Order Set & complete

## 2012-01-01 NOTE — Progress Notes (Signed)
Subjective: Complaining of abdominal pain.  Complaining of nausea and vomiting.  Objective: Vital signs in last 24 hours: Filed Vitals:   12/31/11 1411 12/31/11 2051 01/01/12 0533 01/01/12 1028  BP: 111/74 90/61 113/72   Pulse: 83 67 66   Temp: 98.1 F (36.7 C) 97.5 F (36.4 C) 97.6 F (36.4 C)   TempSrc: Oral Axillary Oral   Resp: 20 18 20    Height:      Weight:   148.2 kg (326 lb 11.6 oz) 143 kg (315 lb 4.1 oz)  SpO2: 93% 95% 97%    Weight change:   Intake/Output Summary (Last 24 hours) at 01/01/12 1326 Last data filed at 01/01/12 1130  Gross per 24 hour  Intake   1200 ml  Output   1303 ml  Net   -103 ml    Physical Exam: General: Awake, Oriented, No acute distress. HEENT: EOMI. Neck: Supple CV: S1 and S2 Lungs: Clear to ascultation bilaterally Abdomen: Soft, Generalized tenderness, Nondistended, +bowel sounds. Ext: Good pulses. Trace edema.  Lab Results: Basic Metabolic Panel:  Lab 12/31/11 4098 12/30/11 0253 12/29/11 1823 12/28/11 0941  NA 140 139 139 137  K 4.4 3.8 3.8 3.8  CL 104 106 105 104  CO2 28 25 25 25   GLUCOSE 85 228* 192* 180*  BUN 9 6 6 8   CREATININE 0.92 0.62 0.67 0.77  CALCIUM 9.4 9.5 9.4 9.2  MG -- -- -- --  PHOS -- -- -- --   Liver Function Tests:  Lab 12/31/11 0835 12/30/11 0253 12/28/11 0941  AST 28 9 8   ALT 19 6 6   ALKPHOS 76 69 69  BILITOT 0.3 0.4 0.2*  PROT 7.1 7.3 7.1  ALBUMIN 3.6 3.5 3.5    Lab 12/31/11 0835 12/30/11 0253 12/29/11 1823 12/28/11 0941  LIPASE 155* 66* 51 61*  AMYLASE -- -- -- --   No results found for this basename: AMMONIA:5 in the last 168 hours CBC:  Lab 01/01/12 0700 12/31/11 0835 12/30/11 0253 12/29/11 1823 12/28/11 0941  WBC 5.2 5.2 6.9 6.5 6.5  NEUTROABS -- -- 3.4 3.6 2.6  HGB 13.3 14.5 14.3 14.1 13.7  HCT 40.9 45.0 42.9 42.0 41.5  MCV 95.6 97.2 94.5 94.0 94.3  PLT 235 214 198 198 190   Cardiac Enzymes:  Lab 12/30/11 0253  CKTOTAL --  CKMB --  CKMBINDEX --  TROPONINI <0.30   BNP  (last 3 results) No results found for this basename: PROBNP:3 in the last 8760 hours CBG:  Lab 01/01/12 1230 01/01/12 0824 01/01/12 0802 01/01/12 0743 12/31/11 2211  GLUCAP 166* 71 65* 67* 133*   No results found for this basename: HGBA1C:5 in the last 72 hours Other Labs: No components found with this basename: POCBNP:3 No results found for this basename: DDIMER:2 in the last 168 hours No results found for this basename: CHOL:2,HDL:2,LDLCALC:2,TRIG:2,CHOLHDL:2,LDLDIRECT:2 in the last 168 hours No results found for this basename: TSH,T4TOTAL,FREET3,T3FREE,FREET4,THYROIDAB in the last 168 hours No results found for this basename: VITAMINB12:2,FOLATE:2,FERRITIN:2,TIBC:2,IRON:2,RETICCTPCT:2 in the last 168 hours  Micro Results: Recent Results (from the past 240 hour(s))  MRSA PCR SCREENING     Status: Normal   Collection Time   12/30/11  6:06 AM      Component Value Range Status Comment   MRSA by PCR NEGATIVE  NEGATIVE Final     Studies/Results: No results found.  Medications: I have reviewed the patient's current medications. Scheduled Meds:   . ALPRAZolam  1 mg Oral BID  . aspirin EC  81 mg Oral Daily  . DULoxetine  30 mg Oral Daily  . famotidine  20 mg Oral BID  . gabapentin  400 mg Oral TID  . glipiZIDE  5 mg Oral BID AC  . insulin aspart  0-9 Units Subcutaneous TID WC  . insulin glargine  5 Units Subcutaneous QHS  . lactulose  20 g Oral BID  . losartan  100 mg Oral Daily  . oxyCODONE  60 mg Oral Q12H  . pantoprazole  40 mg Oral BID AC   Continuous Infusions:  PRN Meds:.acetaminophen, acetaminophen, metoCLOPramide, ondansetron (ZOFRAN) IV, ondansetron, oxyCODONE  Assessment/Plan: Abdominal pain Acute on chronic abdominal pain. Etiology unclear. Lipase elevated, not sure if the patient truly has chronic pancreatitis. More than 9 CT Abd/pelvis over the last year. EGD in May with esophagitis. Gastric emptying scan normal in June. Appreciate GI consultation, GI recommended  cutting back on pain meds. He is scheduled for upper endoscopy with MAC sedation in 2 weeks. GI also recommended scheduled antiemetics, contracted psychiatry who do not help with narcotic dependence inpatient, may need to followup with PCP or pain clinic for futher management. Continue PPI BID. Continue home Oxycontin and oxycodone. Continue full liq diet. Advance as tolerated.  CAD status post stenting Stable. Continue home medications. Continue aspirin.  Diabetes mellitus type 2 with hypoglycemia Continue lantus at lower dose. Discontinue glipizide. SSI.   Hypertension  Continue home medications.  Prophylaxis SCDs.  Disposition Pending improvement in tolerated oral intake.   LOS: 3 days  Gaius Ishaq A, MD 01/01/2012, 1:26 PM

## 2012-01-02 DIAGNOSIS — I10 Essential (primary) hypertension: Secondary | ICD-10-CM

## 2012-01-02 LAB — GLUCOSE, CAPILLARY
Glucose-Capillary: 110 mg/dL — ABNORMAL HIGH (ref 70–99)
Glucose-Capillary: 187 mg/dL — ABNORMAL HIGH (ref 70–99)
Glucose-Capillary: 162 mg/dL — ABNORMAL HIGH (ref 70–99)
Glucose-Capillary: 149 mg/dL — ABNORMAL HIGH (ref 70–99)

## 2012-01-02 MED ORDER — PROMETHAZINE HCL 12.5 MG PO TABS
12.5000 mg | ORAL_TABLET | Freq: Four times a day (QID) | ORAL | Status: DC
  Administered 2012-01-02 – 2012-01-06 (×9): 12.5 mg via ORAL
  Filled 2012-01-02 (×19): qty 1

## 2012-01-02 NOTE — Progress Notes (Signed)
Physical Therapy Treatment Patient Details Name: Ryan [name] MRN: [id number] DOB: 1950-04-30 Today's Date: 01/02/2012 Time: 1006-1029 PT Time Calculation (min): 23 min  PT Assessment / Plan / Recommendation Comments on Treatment Session  Pt admitted with pancreatitis and progressing with mobility today. Pt demonstrates LLE weakness of 3/5 with hip flexion and knee extension today compared to 5/5 on RLE. No deficits with sensation or with LUE. Pt unaware of when weakness started. Will continue to follow    Follow Up Recommendations       Barriers to Discharge        Equipment Recommendations       Recommendations for Other Services    Frequency     Plan Discharge plan remains appropriate;Frequency remains appropriate    Precautions / Restrictions Precautions Precautions: Fall   Pertinent Vitals/Pain 6/10 abd pain, RN aware    Mobility  Bed Mobility Supine to Sit: 6: Modified independent (Device/Increase time);With rails Transfers Sit to Stand: 6: Modified independent (Device/Increase time) Stand to Sit: 5: Supervision;With armrests;To chair/3-in-1 Details for Transfer Assistance: cueing for hand placement and control of descent Ambulation/Gait Ambulation/Gait Assistance: 4: Min guard Ambulation Distance (Feet): 250 Feet Assistive device: Rolling walker Ambulation/Gait Assistance Details: cueing for posture and position in RW pt tends to stay flexed and posterior to Rw. Pt with report of dizziness with ambulation today but no LOB Gait Pattern: Step-through pattern;Trunk flexed;Decreased stride length Gait velocity: decreased Stairs: No (pt declined attempting today)    Exercises General Exercises - Lower Extremity Long Arc Quad: AROM;Both;15 reps;Seated Hip Flexion/Marching: AROM;Both;15 reps;Seated   PT Diagnosis:    PT Problem List:   PT Treatment Interventions:     PT Goals Acute Rehab PT Goals PT Goal: Sit to Stand - Progress: Met PT Goal: Stand to Sit -  Progress: Progressing toward goal PT Goal: Ambulate - Progress: Progressing toward goal PT Goal: Up/Down Stairs - Progress: Progressing toward goal  Visit Information  Last PT Received On: 01/02/12 Assistance Needed: +1    Subjective Data  Subjective: I threw up everything I had for breakfast   Cognition  Overall Cognitive Status: Appears within functional limits for tasks assessed/performed Arousal/Alertness: Awake/alert Orientation Level: Appears intact for tasks assessed Behavior During Session: Total [facility] for tasks performed    Balance     End of Session PT - End of Session Equipment Utilized During Treatment: Gait belt Activity Tolerance: Patient tolerated treatment well Patient left: in chair;with call bell/phone within reach Nurse Communication: Mobility status   GP     [name] 01/02/2012, 10:33 AM [name], PT [phone]

## 2012-01-02 NOTE — Progress Notes (Signed)
Subjective: Complaining of abdominal pain.  Felt nauseated this morning and vomited.  Objective: Vital signs in last 24 hours: Filed Vitals:   01/01/12 1028 01/01/12 1434 01/01/12 2200 01/02/12 0559  BP:  160/78 129/69 127/79  Pulse:  68 88 89  Temp:  98.3 F (36.8 C) 97.6 F (36.4 C) 97.9 F (36.6 C)  TempSrc:  Oral Oral Oral  Resp:  20 18 20   Height:      Weight: 143 kg (315 lb 4.1 oz)   147.4 kg (324 lb 15.3 oz)  SpO2:  94% 93% 93%   Weight change: -5.2 kg (-11 lb 7.4 oz)  Intake/Output Summary (Last 24 hours) at 01/02/12 1307 Last data filed at 01/02/12 0900  Gross per 24 hour  Intake   2260 ml  Output    450 ml  Net   1810 ml    Physical Exam: General: Awake, Oriented, No acute distress. HEENT: EOMI. Neck: Supple CV: S1 and S2 Lungs: Clear to ascultation bilaterally Abdomen: Soft, Generalized tenderness, Nondistended, +bowel sounds. Ext: Good pulses. Trace edema.  Lab Results: Basic Metabolic Panel:  Lab 12/31/11 4098 12/30/11 0253 12/29/11 1823 12/28/11 0941  NA 140 139 139 137  K 4.4 3.8 3.8 3.8  CL 104 106 105 104  CO2 28 25 25 25   GLUCOSE 85 228* 192* 180*  BUN 9 6 6 8   CREATININE 0.92 0.62 0.67 0.77  CALCIUM 9.4 9.5 9.4 9.2  MG -- -- -- --  PHOS -- -- -- --   Liver Function Tests:  Lab 12/31/11 0835 12/30/11 0253 12/28/11 0941  AST 28 9 8   ALT 19 6 6   ALKPHOS 76 69 69  BILITOT 0.3 0.4 0.2*  PROT 7.1 7.3 7.1  ALBUMIN 3.6 3.5 3.5    Lab 12/31/11 0835 12/30/11 0253 12/29/11 1823 12/28/11 0941  LIPASE 155* 66* 51 61*  AMYLASE -- -- -- --   No results found for this basename: AMMONIA:5 in the last 168 hours CBC:  Lab 01/01/12 0700 12/31/11 0835 12/30/11 0253 12/29/11 1823 12/28/11 0941  WBC 5.2 5.2 6.9 6.5 6.5  NEUTROABS -- -- 3.4 3.6 2.6  HGB 13.3 14.5 14.3 14.1 13.7  HCT 40.9 45.0 42.9 42.0 41.5  MCV 95.6 97.2 94.5 94.0 94.3  PLT 235 214 198 198 190   Cardiac Enzymes:  Lab 12/30/11 0253  CKTOTAL --  CKMB --  CKMBINDEX --    TROPONINI <0.30   BNP (last 3 results) No results found for this basename: PROBNP:3 in the last 8760 hours CBG:  Lab 01/02/12 1219 01/02/12 0750 01/02/12 0542 01/01/12 2157 01/01/12 1701  GLUCAP 187* 110* 112* 164* 171*   No results found for this basename: HGBA1C:5 in the last 72 hours Other Labs: No components found with this basename: POCBNP:3 No results found for this basename: DDIMER:2 in the last 168 hours No results found for this basename: CHOL:2,HDL:2,LDLCALC:2,TRIG:2,CHOLHDL:2,LDLDIRECT:2 in the last 168 hours No results found for this basename: TSH,T4TOTAL,FREET3,T3FREE,FREET4,THYROIDAB in the last 168 hours No results found for this basename: VITAMINB12:2,FOLATE:2,FERRITIN:2,TIBC:2,IRON:2,RETICCTPCT:2 in the last 168 hours  Micro Results: Recent Results (from the past 240 hour(s))  MRSA PCR SCREENING     Status: Normal   Collection Time   12/30/11  6:06 AM      Component Value Range Status Comment   MRSA by PCR NEGATIVE  NEGATIVE Final     Studies/Results: No results found.  Medications: I have reviewed the patient's current medications. Scheduled Meds:    . ALPRAZolam  1 mg Oral BID  . aspirin EC  81 mg Oral Daily  . DULoxetine  30 mg Oral Daily  . famotidine  20 mg Oral BID  . gabapentin  400 mg Oral TID  . insulin aspart  0-9 Units Subcutaneous TID WC  . insulin glargine  5 Units Subcutaneous QHS  . lactulose  20 g Oral BID  . losartan  100 mg Oral Daily  . ondansetron  4 mg Oral Q6H  . oxyCODONE  60 mg Oral Q12H  . pantoprazole  40 mg Oral BID AC  . DISCONTD: glipiZIDE  5 mg Oral BID AC   Continuous Infusions:  PRN Meds:.acetaminophen, acetaminophen, metoCLOPramide, oxyCODONE, promethazine, promethazine, promethazine, DISCONTD: ondansetron (ZOFRAN) IV, DISCONTD: ondansetron  Assessment/Plan: Abdominal pain Acute on chronic abdominal pain. Etiology unclear. Lipase elevated, not sure if the patient truly has chronic pancreatitis. More than 9 CT  Abd/pelvis over the last year. EGD in May with esophagitis. Gastric emptying scan normal in June. Appreciate GI consultation, GI recommended cutting back on pain meds. He is scheduled for upper endoscopy with MAC sedation in 2 weeks. Per GI's recommendation placed the patient on scheduled anti-emetics Zofran and Phenergan. Attempted to contact psychiatry who do not help with narcotic dependence inpatient, may need to followup with PCP or pain clinic for futher management, may consider weaning the patient off of opiates. Continue PPI BID. Continue home Oxycontin and oxycodone.  Advance diet to diabetic diet.    CAD status post stenting Stable. Continue home medications. Continue aspirin.  Diabetes mellitus type 2 with hypoglycemia Continue lantus at lower dose. Discontinue glipizide. SSI.  Blood sugars improved, no further hypoglycemic episodes since yesterday.  Hypertension  Continue home medications.  Prophylaxis SCDs.  Disposition The patient is able to tolerate oral intake consider discharge tomorrow.   LOS: 4 days  Mao Lockner A, MD 01/02/2012, 1:07 PM

## 2012-01-02 NOTE — Care Management Note (Signed)
    Page 1 of 2   01/06/2012     12:42:50 PM   CARE MANAGEMENT NOTE 01/06/2012  Patient:  Ryan [name],Ryan [name]   Account Number:  [id number]  Date Initiated:  01/02/2012  Documentation initiated by:  Letha Cape  Subjective/Objective Assessment:   dx abd pain  admit     Action/Plan:   pt eval- recs hhpt   Anticipated DC Date:  01/04/2012   Anticipated DC Plan:  HOME W HOME HEALTH SERVICES      DC Planning Services  CM consult      [facility] Choice  HOME HEALTH   Choice offered to / List presented to:  C-1 Patient        HH arranged  HH-2 PT  HH-1 RN  HH-6 SOCIAL WORKER      HH agency  [facility]   Status of service:  Completed, signed off Medicare Important Message given?   (If response is "NO", the following Medicare IM given date fields will be blank) Date Medicare IM given:   Date Additional Medicare IM given:    Discharge Disposition:  HOME W HOME HEALTH SERVICES  Per UR Regulation:  Reviewed for med. necessity/level of care/duration of stay  If discussed at Long Length of Stay Meetings, dates discussed:    Comments:  01/06/12 12:41 Letha Cape RN, BSN [phone] patient for dc today, home with [facility] services, [facility] notified.  01/03/12 17:37 Letha Cape RN, BSN [phone] pt for possible dc today or tomorrow.  [facility] notified. Patient has been having [place] and vomiting with every meal, diet dropped back to full liquid.  01/02/12 17:09 Letha Cape RN, BSN [phone] pt in with abd pain, per physical therapy recs hhpt, patient stated he wanted to work with [facility], referral made to [place] but they do not work with [employer], so patient states he will like to work with [facility] then patient stated he wanted to go to snf , I informed him physical therapy recs for him to have hh but he can speak with MD about it tomorrow, he states he will speak to MD tomorrow.   Referral made to [facility] for hhpt and hhrn.  Soc will begin 24-48 hrs post discharge.

## 2012-01-03 LAB — GLUCOSE, CAPILLARY
Glucose-Capillary: 204 mg/dL — ABNORMAL HIGH (ref 70–99)
Glucose-Capillary: 184 mg/dL — ABNORMAL HIGH (ref 70–99)
Glucose-Capillary: 258 mg/dL — ABNORMAL HIGH (ref 70–99)

## 2012-01-03 MED ORDER — METOCLOPRAMIDE HCL 10 MG PO TABS
10.0000 mg | ORAL_TABLET | Freq: Three times a day (TID) | ORAL | Status: DC
  Administered 2012-01-03 – 2012-01-06 (×11): 10 mg via ORAL
  Filled 2012-01-03 (×15): qty 1

## 2012-01-03 MED ORDER — OXYCODONE HCL 5 MG PO TABS
20.0000 mg | ORAL_TABLET | ORAL | Status: DC | PRN
  Administered 2012-01-03 – 2012-01-04 (×3): 20 mg via ORAL
  Filled 2012-01-03 (×3): qty 4

## 2012-01-03 MED ORDER — OXYCODONE HCL 40 MG PO TB12
50.0000 mg | ORAL_TABLET | Freq: Two times a day (BID) | ORAL | Status: DC
  Administered 2012-01-04 (×2): 50 mg via ORAL
  Filled 2012-01-03: qty 1
  Filled 2012-01-03: qty 2
  Filled 2012-01-03: qty 1

## 2012-01-03 NOTE — Progress Notes (Signed)
Patient ate several gram crackers with peanut butter and diet ginger ale without vomiting. Will continue to monitor. Ryan [name]

## 2012-01-03 NOTE — Progress Notes (Signed)
Physical Therapy Treatment Patient Details Name: Ryan [name] MRN: [id number] DOB: [birth year] Today's Date: 01/03/2012 Time: 5284-1324 PT Time Calculation (min): 28 min  PT Assessment / Plan / Recommendation Comments on Treatment Session  Pt admitted with pancreatitis and progressing with mobility able to ambulate and get up steps today. Pt with generally flat affect and increased time for transfers potentially due to pain meds. Pt stating today that he doesn't feel like he can go home because roommate is rarely there and he doesn't feel like he can perform ADLs particularly cooking on his own. Pt states he walked in the hall on his own earlier this morning. Pt appears to have decreased motivation for maximizing activity. ST-SNF may benefit pt if he truly feels home unsafe without assist. Will follow.     Follow Up Recommendations  Home health PT;Skilled nursing facility (potential ST-SNF if pt truly feels roommate cannot help)    Barriers to Discharge        Equipment Recommendations       Recommendations for Other Services    Frequency     Plan Discharge plan remains appropriate;Frequency remains appropriate    Precautions / Restrictions Precautions Precautions: Fall   Pertinent Vitals/Pain [name] pain    Mobility  Bed Mobility Supine to Sit: 5: Supervision Details for Bed Mobility Assistance: cueing to initiate and increased time to complete Transfers Sit to Stand: From bed;6: Modified independent (Device/Increase time) Stand to Sit: To chair/3-in-1;6: Modified independent (Device/Increase time) Details for Transfer Assistance: increased time for transfers Ambulation/Gait Ambulation/Gait Assistance: 5: Supervision Ambulation Distance (Feet): 300 Feet Assistive device: Rolling walker Ambulation/Gait Assistance Details: constant cueing for posture and position in RW pt continues to maintain flexed posture and stay posterior to RW. [name] report of dizziness today Gait Pattern:  Step-through pattern;Decreased stride length;Trunk flexed Gait velocity: decreased Stairs: Yes Stairs Assistance: 5: Supervision Stairs Assistance Details (indicate cue type and reason): cueing for sequence sidestepping rather than forward with one rail  Stair Management Technique: One rail Right;Sideways Number of Stairs: 4     Exercises General Exercises - Lower Extremity Short Arc Quad: AROM;Both;15 reps;Seated Hip Flexion/Marching: AROM;Both;15 reps;Seated   PT Diagnosis:    PT Problem List:   PT Treatment Interventions:     PT Goals Acute Rehab PT Goals PT Goal: Stand to Sit - Progress: Met PT Goal: Ambulate - Progress: Progressing toward goal PT Goal: Up/Down Stairs - Progress: Met  Visit Information  Last PT Received On: 01/03/12 Assistance Needed: +1    Subjective Data  Subjective: I just feel tired or like I'm getting a cold.  "I just don't think I can take care of myself at home"   Cognition  Overall Cognitive Status: Appears within functional limits for tasks assessed/performed Arousal/Alertness: Awake/alert Orientation Level: Appears intact for tasks assessed Behavior During Session: Flat affect Cognition - Other Comments: Pt slow to respond verbally and to commands today appearing lethargic but maintaining arousal throughout    Balance     End of Session PT - End of Session Equipment Utilized During Treatment: Gait belt Activity Tolerance: Patient tolerated treatment well Patient left: in chair;with call bell/phone within reach Nurse Communication: Mobility status   GP     [name] 01/03/2012, 1:59 PM [name], PT [phone]

## 2012-01-03 NOTE — Progress Notes (Signed)
[facility] Progress Note  Subjective:  Called back to see patient.  Vomiting after every meal.  Diet has been changed back to full liquids.  Says pain is a little better but still 7-8/10.  Objective:  Vital signs in last 24 hours: Temp:  [98.2 F (36.8 C)-99.2 F (37.3 C)] 99.1 F (37.3 C) (09/06 1342) Pulse Rate:  [82-98] 95  (09/06 1351) Resp:  [20] 20  (09/06 1342) BP: (112-138)/(71-75) 138/72 mmHg (09/06 1342) SpO2:  [90 %-96 %] 95 % (09/06 1351) Weight:  [324 lb 11.8 oz (147.3 kg)] 324 lb 11.8 oz (147.3 kg) (09/06 0500) Last BM Date: 01/02/12 General:   Alert,  Well-developed, comfortable, in NAD Heart:  Regular rate and rhythm; no murmurs Pulm:  CTAB. No W/R/R. Abdomen:  Soft, nondistended. Normal bowel sounds.  Expresses TTP in the epigastrium and entire left side of the abdomen without rebound or guarding.   Extremities:  Without edema. Neurologic:  Alert and  oriented x4;  grossly normal neurologically. Psych:  Alert and cooperative. Depressed mood and affect.  Intake/Output from previous day: 09/05 0701 - 09/06 0700 In: 1980 [P.O.:1980] Out: -  Intake/Output this shift: Total I/O In: 360 [P.O.:360] Out: -   Assessment / Plan: #1. Chronic abdominal pain with nausea and vomiting- unsure if his findings on previous endoscopy substantiate his complaints.  ?narcotic withdrawal. #2. Upper endoscopy with esophagitis, proximal gastric ulcer, and questionable ampullary abnormality on EGD in 08/2011  #3. CT scan abnormality with mild main pancreatic and central biliary ductal prominence to the level of soft tissue fullness at the ampulla  #4. Diagnosis of chronic pancreatitis with chronically mildly elevated lipase, but no substantial findings on imaging, etc to confirm this diagnosis  #5. Chronic pain syndrome on chronic narcotics  #6. Multiple medical problems including coronary artery disease and insulin requiring diabetes mellitus   -Continue BID PPI therapy    -Scheduled antiemetics and pain control, but need to decrease opiate intake overall.    LOS: 5 days   ZEHR, JESSICA D.  01/03/2012, 3:06 PM  Pager number [phone]

## 2012-01-03 NOTE — Progress Notes (Signed)
Subjective: Has been nauseated and vomiting with every meal.  Working with physical therapy today ambulated the hall.  Objective: Vital signs in last 24 hours: Filed Vitals:   01/03/12 0500 01/03/12 0518 01/03/12 1342 01/03/12 1351  BP:  125/75 138/72   Pulse:  98 95 95  Temp:  99.2 F (37.3 C) 99.1 F (37.3 C)   TempSrc:  Oral Oral   Resp:  20 20   Height:      Weight: 147.3 kg (324 lb 11.8 oz)     SpO2:  94% 96% 95%   Weight change: 4.3 kg (9 lb 7.7 oz)  Intake/Output Summary (Last 24 hours) at 01/03/12 1433 Last data filed at 01/03/12 1343  Gross per 24 hour  Intake   1660 ml  Output      0 ml  Net   1660 ml    Physical Exam: General: Awake, Oriented, No acute distress. HEENT: EOMI. Neck: Supple CV: S1 and S2 Lungs: Clear to ascultation bilaterally Abdomen: Soft, Generalized tenderness, Nondistended, +bowel sounds. Ext: Good pulses. Trace edema.  Lab Results: Basic Metabolic Panel:  Lab 12/31/11 1610 12/30/11 0253 12/29/11 1823 12/28/11 0941  NA 140 139 139 137  K 4.4 3.8 3.8 3.8  CL 104 106 105 104  CO2 28 25 25 25   GLUCOSE 85 228* 192* 180*  BUN 9 6 6 8   CREATININE 0.92 0.62 0.67 0.77  CALCIUM 9.4 9.5 9.4 9.2  MG -- -- -- --  PHOS -- -- -- --   Liver Function Tests:  Lab 12/31/11 0835 12/30/11 0253 12/28/11 0941  AST 28 9 8   ALT 19 6 6   ALKPHOS 76 69 69  BILITOT 0.3 0.4 0.2*  PROT 7.1 7.3 7.1  ALBUMIN 3.6 3.5 3.5    Lab 12/31/11 0835 12/30/11 0253 12/29/11 1823 12/28/11 0941  LIPASE 155* 66* 51 61*  AMYLASE -- -- -- --   No results found for this basename: AMMONIA:5 in the last 168 hours CBC:  Lab 01/01/12 0700 12/31/11 0835 12/30/11 0253 12/29/11 1823 12/28/11 0941  WBC 5.2 5.2 6.9 6.5 6.5  NEUTROABS -- -- 3.4 3.6 2.6  HGB 13.3 14.5 14.3 14.1 13.7  HCT 40.9 45.0 42.9 42.0 41.5  MCV 95.6 97.2 94.5 94.0 94.3  PLT 235 214 198 198 190   Cardiac Enzymes:  Lab 12/30/11 0253  CKTOTAL --  CKMB --  CKMBINDEX --  TROPONINI <0.30   BNP  (last 3 results) No results found for this basename: PROBNP:3 in the last 8760 hours CBG:  Lab 01/03/12 1225 01/03/12 0800 01/02/12 2046 01/02/12 1640 01/02/12 1219  GLUCAP 184* 204* 149* 162* 187*   No results found for this basename: HGBA1C:5 in the last 72 hours Other Labs: No components found with this basename: POCBNP:3 No results found for this basename: DDIMER:2 in the last 168 hours No results found for this basename: CHOL:2,HDL:2,LDLCALC:2,TRIG:2,CHOLHDL:2,LDLDIRECT:2 in the last 168 hours No results found for this basename: TSH,T4TOTAL,FREET3,T3FREE,FREET4,THYROIDAB in the last 168 hours No results found for this basename: VITAMINB12:2,FOLATE:2,FERRITIN:2,TIBC:2,IRON:2,RETICCTPCT:2 in the last 168 hours  Micro Results: Recent Results (from the past 240 hour(s))  MRSA PCR SCREENING     Status: Normal   Collection Time   12/30/11  6:06 AM      Component Value Range Status Comment   MRSA by PCR NEGATIVE  NEGATIVE Final     Studies/Results: No results found.  Medications: I have reviewed the patient's current medications. Scheduled Meds:    . ALPRAZolam  1 mg Oral BID  . aspirin EC  81 mg Oral Daily  . DULoxetine  30 mg Oral Daily  . famotidine  20 mg Oral BID  . gabapentin  400 mg Oral TID  . insulin aspart  0-9 Units Subcutaneous TID WC  . insulin glargine  5 Units Subcutaneous QHS  . lactulose  20 g Oral BID  . losartan  100 mg Oral Daily  . ondansetron  4 mg Oral Q6H  . oxyCODONE  50 mg Oral Q12H  . pantoprazole  40 mg Oral BID AC  . promethazine  12.5 mg Oral Q6H  . DISCONTD: oxyCODONE  60 mg Oral Q12H   Continuous Infusions:  PRN Meds:.acetaminophen, acetaminophen, metoCLOPramide, oxyCODONE, promethazine, promethazine, promethazine, DISCONTD: oxyCODONE  Assessment/Plan: Abdominal pain with nausea and vomiting Acute on chronic abdominal pain. Etiology unclear. Lipase elevated, not sure if the patient truly has chronic pancreatitis. More than 9 CT Abd/pelvis  over the last year. EGD in May with esophagitis. Gastric emptying scan normal in June. Appreciate GI consultation, GI recommended cutting back on pain meds. He is scheduled for upper endoscopy with MAC sedation in 2 weeks.  Place the patient on scheduled anti-emetics with Zofran and Phenergan, add Reglan to his anti-emetic regimen. Attempted to contact psychiatry who do not help with narcotic dependence inpatient, may need to followup with PCP or pain clinic for futher management, may consider weaning the patient off of opiates. Continue PPI BID.  Decreased the dose of home OxyContin and oxycodone.  Drop his diet back to full liquid diet from diabetic diet.    CAD status post stenting Stable. Continue home medications. Continue aspirin.  Diabetes mellitus type 2 with hypoglycemia Continue lantus at lower dose. Discontinue glipizide. SSI.  Blood sugars improved, no further hypoglycemic episodes.  Chronic pain syndrome/opioid dependence Decrease the dose of OxyContin and oxycodone.  Hypertension  Stable. Continue home medications.  Prophylaxis SCDs.  Disposition Pending patient's improvement in nausea and vomiting.   LOS: 5 days  Nida Manfredi A, MD 01/03/2012, 2:33 PM

## 2012-01-03 NOTE — Progress Notes (Signed)
1000 Patient vomited large amount of breakfast  Dr. Betti Cruz made aware

## 2012-01-03 NOTE — Progress Notes (Signed)
Patient has asked for his pain medicine at 0145, before the nurse could make it back to the room patient was snoring and would not wake up to the nurse calling his name. PRN oxy was not given for this reason nor was the schedule oxy. RN has been checking on the patient every hour to address pain and patient has been in the bed snoring. Will continue to monitor patient throughout the night. Madilyn Fireman [place]

## 2012-01-04 LAB — BASIC METABOLIC PANEL
BUN: 7 mg/dL (ref 6–23)
CO2: 35 mEq/L — ABNORMAL HIGH (ref 19–32)
Glucose, Bld: 159 mg/dL — ABNORMAL HIGH (ref 70–99)
Potassium: 4.2 mEq/L (ref 3.5–5.1)
Sodium: 142 mEq/L (ref 135–145)

## 2012-01-04 LAB — CBC
HCT: 40.5 % (ref 39.0–52.0)
Hemoglobin: 13.2 g/dL (ref 13.0–17.0)
RBC: 4.23 MIL/uL (ref 4.22–5.81)

## 2012-01-04 LAB — GLUCOSE, CAPILLARY
Glucose-Capillary: 162 mg/dL — ABNORMAL HIGH (ref 70–99)
Glucose-Capillary: 312 mg/dL — ABNORMAL HIGH (ref 70–99)

## 2012-01-04 MED ORDER — OXYCODONE HCL 10 MG PO TB12
20.0000 mg | ORAL_TABLET | Freq: Two times a day (BID) | ORAL | Status: DC
  Administered 2012-01-05: 20 mg via ORAL
  Filled 2012-01-04: qty 2

## 2012-01-04 MED ORDER — OXYCODONE HCL 5 MG PO TABS
30.0000 mg | ORAL_TABLET | ORAL | Status: DC | PRN
  Administered 2012-01-04 – 2012-01-06 (×9): 30 mg via ORAL
  Filled 2012-01-04 (×9): qty 6

## 2012-01-04 NOTE — Progress Notes (Signed)
Subjective: Still feeling nauseated.  Vomiting this morning.  Reports that he is tolerating full liquids better so far.  Complaining about pain.  Objective: Vital signs in last 24 hours: Filed Vitals:   01/03/12 2112 01/04/12 0356 01/04/12 0603 01/04/12 1330  BP:   125/72 129/79  Pulse:   78 80  Temp:   98.4 F (36.9 C) 98.3 F (36.8 C)  TempSrc:   Oral   Resp:   18 18  Height:      Weight:  147.2 kg (324 lb 8.3 oz) 146.8 kg (323 lb 10.2 oz)   SpO2: 94%  91% 96%   Weight change: -0.1 kg (-3.5 oz)  Intake/Output Summary (Last 24 hours) at 01/04/12 1614 Last data filed at 01/04/12 1300  Gross per 24 hour  Intake    960 ml  Output   1350 ml  Net   -390 ml    Physical Exam: General: Awake, Oriented, No acute distress. HEENT: EOMI. Neck: Supple CV: S1 and S2 Lungs: Clear to ascultation bilaterally Abdomen: Soft, Generalized tenderness, Nondistended, +bowel sounds. Ext: Good pulses. Trace edema.  Lab Results: Basic Metabolic Panel:  Lab 01/04/12 1610 12/31/11 0835 12/30/11 0253 12/29/11 1823  NA 142 140 139 139  K 4.2 4.4 3.8 3.8  CL 102 104 106 105  CO2 35* 28 25 25   GLUCOSE 159* 85 228* 192*  BUN 7 9 6 6   CREATININE 0.75 0.92 0.62 0.67  CALCIUM 9.6 9.4 9.5 9.4  MG -- -- -- --  PHOS -- -- -- --   Liver Function Tests:  Lab 12/31/11 0835 12/30/11 0253  AST 28 9  ALT 19 6  ALKPHOS 76 69  BILITOT 0.3 0.4  PROT 7.1 7.3  ALBUMIN 3.6 3.5    Lab 12/31/11 0835 12/30/11 0253 12/29/11 1823  LIPASE 155* 66* 51  AMYLASE -- -- --   No results found for this basename: AMMONIA:5 in the last 168 hours CBC:  Lab 01/04/12 0555 01/01/12 0700 12/31/11 0835 12/30/11 0253 12/29/11 1823  WBC 5.1 5.2 5.2 6.9 6.5  NEUTROABS -- -- -- 3.4 3.6  HGB 13.2 13.3 14.5 14.3 14.1  HCT 40.5 40.9 45.0 42.9 42.0  MCV 95.7 95.6 97.2 94.5 94.0  PLT 198 235 214 198 198   Cardiac Enzymes:  Lab 12/30/11 0253  CKTOTAL --  CKMB --  CKMBINDEX --  TROPONINI <0.30   BNP (last 3  results) No results found for this basename: PROBNP:3 in the last 8760 hours CBG:  Lab 01/04/12 1204 01/04/12 0742 01/03/12 2111 01/03/12 1717 01/03/12 1225  GLUCAP 312* 162* 258* 169* 184*   No results found for this basename: HGBA1C:5 in the last 72 hours Other Labs: No components found with this basename: POCBNP:3 No results found for this basename: DDIMER:2 in the last 168 hours No results found for this basename: CHOL:2,HDL:2,LDLCALC:2,TRIG:2,CHOLHDL:2,LDLDIRECT:2 in the last 168 hours No results found for this basename: TSH,T4TOTAL,FREET3,T3FREE,FREET4,THYROIDAB in the last 168 hours No results found for this basename: VITAMINB12:2,FOLATE:2,FERRITIN:2,TIBC:2,IRON:2,RETICCTPCT:2 in the last 168 hours  Micro Results: Recent Results (from the past 240 hour(s))  MRSA PCR SCREENING     Status: Normal   Collection Time   12/30/11  6:06 AM      Component Value Range Status Comment   MRSA by PCR NEGATIVE  NEGATIVE Final     Studies/Results: No results found.  Medications: I have reviewed the patient's current medications. Scheduled Meds:    . ALPRAZolam  1 mg Oral BID  .  aspirin EC  81 mg Oral Daily  . DULoxetine  30 mg Oral Daily  . famotidine  20 mg Oral BID  . gabapentin  400 mg Oral TID  . insulin aspart  0-9 Units Subcutaneous TID WC  . insulin glargine  5 Units Subcutaneous QHS  . lactulose  20 g Oral BID  . losartan  100 mg Oral Daily  . metoCLOPramide  10 mg Oral TID AC & HS  . ondansetron  4 mg Oral Q6H  . oxyCODONE  20 mg Oral Q12H  . pantoprazole  40 mg Oral BID AC  . promethazine  12.5 mg Oral Q6H  . DISCONTD: oxyCODONE  50 mg Oral Q12H   Continuous Infusions:  PRN Meds:.acetaminophen, acetaminophen, oxyCODONE, promethazine, promethazine, promethazine, DISCONTD: oxyCODONE  Assessment/Plan: Abdominal pain with nausea and vomiting Acute on chronic abdominal pain. Etiology unclear. Lipase elevated, not sure if the patient truly has chronic pancreatitis. More  than 9 CT Abd/pelvis over the last year. EGD in May with esophagitis. Gastric emptying scan normal in June. Per GI cutting back on his pain medications, decreased OxyContin extended release from 40 to 20 mg BID, continue oxycodone at his usual home dose of 30 mg q4h.  GI planning of EGD tomorrow. Continue the patient on scheduled anti-emetics with Zofran Reglan and Phenergan. Continue PPI BID.  Full liquid diet, and NPO after midnight for EGD.   CAD status post stenting Stable. Continue home medications. Continue aspirin.  Diabetes mellitus type 2 with hypoglycemia Continue lantus at lower dose. Discontinue glipizide. SSI.  Blood sugars improved, no further hypoglycemic episodes.  Given patient will be n.p.o. tomorrow for EGD, will not make any changes to his diabetic regimen given the blood sugars are elevated today.  Chronic pain syndrome/opioid dependence Decrease the dose of OxyContin. Continue oxycodone at current dose for now.  Hypertension  Stable. Continue home medications.  Prophylaxis SCDs.  Disposition Pending patient's improvement in nausea and vomiting.   LOS: 6 days  Joram Venson A, MD 01/04/2012, 4:14 PM

## 2012-01-04 NOTE — Progress Notes (Signed)
________________________________________________________________________  [facility] MD note:  I personally reviewed the data and agree with the assessment and plan described above.   Rob Bunting, MD [facility] Pager [phone]

## 2012-01-04 NOTE — Progress Notes (Addendum)
.   [facility] Progress Note    Since last GI note: Vomting after he eats, no dysphagia.  Cutting back on narcotic pain meds, pt not happy about this.  Objective: Vital signs in last 24 hours: Temp:  [98.4 F (36.9 C)-99.1 F (37.3 C)] 98.4 F (36.9 C) (09/07 0603) Pulse Rate:  [78-95] 78  (09/07 0603) Resp:  [18-20] 18  (09/07 0603) BP: (125-141)/(72-75) 125/72 mmHg (09/07 0603) SpO2:  [84 %-96 %] 91 % (09/07 0603) Weight:  [323 lb 10.2 oz (146.8 kg)-324 lb 8.3 oz (147.2 kg)] 323 lb 10.2 oz (146.8 kg) (09/07 0603) Last BM Date: 01/02/12 General: alert and oriented times 3 Heart: regular rate and rythm Abdomen: soft, non-tender, non-distended, normal bowel sounds   Lab Results:  Basename 01/04/12 0555  WBC 5.1  HGB 13.2  PLT 198  MCV 95.7    Basename 01/04/12 0555  NA 142  K 4.2  CL 102  CO2 35*  GLUCOSE 159*  BUN 7  CREATININE 0.75  CALCIUM 9.6    Medications: Scheduled Meds:   . ALPRAZolam  1 mg Oral BID  . aspirin EC  81 mg Oral Daily  . DULoxetine  30 mg Oral Daily  . famotidine  20 mg Oral BID  . gabapentin  400 mg Oral TID  . insulin aspart  0-9 Units Subcutaneous TID WC  . insulin glargine  5 Units Subcutaneous QHS  . lactulose  20 g Oral BID  . losartan  100 mg Oral Daily  . metoCLOPramide  10 mg Oral TID AC & HS  . ondansetron  4 mg Oral Q6H  . oxyCODONE  50 mg Oral Q12H  . pantoprazole  40 mg Oral BID AC  . promethazine  12.5 mg Oral Q6H  . DISCONTD: oxyCODONE  60 mg Oral Q12H   Continuous Infusions:  PRN Meds:.acetaminophen, acetaminophen, oxyCODONE, promethazine, promethazine, promethazine, DISCONTD: metoCLOPramide, DISCONTD: oxyCODONE    Assessment/Plan: 61 y.o. 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 Dr. Marina Goodell has planned for outpatient MAC sedated repeat EGD in 10  days from now.  Since pt is vomiting so much I think performing this examination while IP is reasonable.  I reviewed his EGD note from 08/2011 and it looks like moderate sedation was adequate and so I will schedule him for EGD tomorrow AM in endo with moderate sedation (will use ERCP scope to view ampulla well).  It is not clear if any of the previously noted abnormalities are causing or contributing to his vomiting.  Certainly his VERY high doses of daily narcotic pain meds (120 mg oxycontin a day and also 180 mg oxycodone a day) makes it very possible that he experiences periodic withdrawal  symptoms (nausea/vomiting/abd pain usually) whenever he does not get enough narcotics.  I do not understand how he can be requiring/getting such high doses of narcotics for so long (years) as an out patient.    Rob Bunting, MD  01/04/2012, 7:24 AM [facility] Pager [phone]

## 2012-01-05 ENCOUNTER — Encounter (HOSPITAL_COMMUNITY): Payer: Self-pay | Admitting: *Deleted

## 2012-01-05 ENCOUNTER — Encounter (HOSPITAL_COMMUNITY)
Admission: EM | Disposition: A | Discharge status: Home / Self Care | Payer: Self-pay | Source: Home / Self Care | Attending: Internal Medicine

## 2012-01-05 DIAGNOSIS — R933 Abnormal findings on diagnostic imaging of other parts of digestive tract: Secondary | ICD-10-CM

## 2012-01-05 HISTORY — PX: ESOPHAGOGASTRODUODENOSCOPY: SHX5428

## 2012-01-05 LAB — GLUCOSE, CAPILLARY: Glucose-Capillary: 380 mg/dL — ABNORMAL HIGH (ref 70–99)

## 2012-01-05 SURGERY — EGD (ESOPHAGOGASTRODUODENOSCOPY)
Anesthesia: Moderate Sedation

## 2012-01-05 MED ORDER — BUTAMBEN-TETRACAINE-BENZOCAINE 2-2-14 % EX AERO
INHALATION_SPRAY | CUTANEOUS | Status: DC | PRN
  Administered 2012-01-05: 2 via TOPICAL

## 2012-01-05 MED ORDER — DIPHENHYDRAMINE HCL 50 MG/ML IJ SOLN
INTRAMUSCULAR | Status: AC
  Filled 2012-01-05: qty 1

## 2012-01-05 MED ORDER — GLIPIZIDE 2.5 MG HALF TABLET
2.5000 mg | ORAL_TABLET | Freq: Two times a day (BID) | ORAL | Status: DC
  Administered 2012-01-05 – 2012-01-06 (×3): 2.5 mg via ORAL
  Filled 2012-01-05 (×6): qty 1

## 2012-01-05 MED ORDER — FENTANYL CITRATE 0.05 MG/ML IJ SOLN
INTRAMUSCULAR | Status: DC | PRN
  Administered 2012-01-05 (×4): 25 ug via INTRAVENOUS

## 2012-01-05 MED ORDER — MIDAZOLAM HCL 10 MG/2ML IJ SOLN
INTRAMUSCULAR | Status: DC | PRN
  Administered 2012-01-05 (×3): 2 mg via INTRAVENOUS
  Administered 2012-01-05: 1 mg via INTRAVENOUS

## 2012-01-05 MED ORDER — MIDAZOLAM HCL 5 MG/ML IJ SOLN
INTRAMUSCULAR | Status: AC
  Filled 2012-01-05: qty 3

## 2012-01-05 MED ORDER — INSULIN GLARGINE 100 UNIT/ML ~~LOC~~ SOLN
10.0000 [IU] | Freq: Every day | SUBCUTANEOUS | Status: DC
  Administered 2012-01-05: 10 [IU] via SUBCUTANEOUS

## 2012-01-05 MED ORDER — OXYCODONE HCL 10 MG PO TB12
20.0000 mg | ORAL_TABLET | Freq: Two times a day (BID) | ORAL | Status: AC
  Administered 2012-01-05: 20 mg via ORAL
  Filled 2012-01-05: qty 2

## 2012-01-05 MED ORDER — DIPHENHYDRAMINE HCL 50 MG/ML IJ SOLN
INTRAMUSCULAR | Status: DC | PRN
  Administered 2012-01-05 (×2): 25 mg via INTRAVENOUS

## 2012-01-05 MED ORDER — SODIUM CHLORIDE 0.9 % IV SOLN
INTRAVENOUS | Status: DC
  Administered 2012-01-05: 500 mL via INTRAVENOUS

## 2012-01-05 MED ORDER — FENTANYL CITRATE 0.05 MG/ML IJ SOLN
INTRAMUSCULAR | Status: AC
  Filled 2012-01-05: qty 4

## 2012-01-05 NOTE — Progress Notes (Signed)
Subjective: Feeling better today.  Evaluated the patient after the EGD.  Objective: Vital signs in last 24 hours: Filed Vitals:   01/05/12 0820 01/05/12 0825 01/05/12 0839 01/05/12 1300  BP: 135/82 144/84  142/72  Pulse:    66  Temp:    98.2 F (36.8 C)  TempSrc:      Resp: 15 12 23 18   Height:      Weight:      SpO2: 96% 100%  96%   Weight change: -0.5 kg (-1 lb 1.6 oz)  Intake/Output Summary (Last 24 hours) at 01/05/12 1431 Last data filed at 01/05/12 1300  Gross per 24 hour  Intake   1460 ml  Output      0 ml  Net   1460 ml    Physical Exam: General: Awake, Oriented, No acute distress. HEENT: EOMI. Neck: Supple CV: S1 and S2 Lungs: Clear to ascultation bilaterally Abdomen: Soft, Generalized tenderness, Nondistended, +bowel sounds. Ext: Good pulses. Trace edema.  Lab Results: Basic Metabolic Panel:  Lab 01/04/12 1191 12/31/11 0835 12/30/11 0253 12/29/11 1823  NA 142 140 139 139  K 4.2 4.4 3.8 3.8  CL 102 104 106 105  CO2 35* 28 25 25   GLUCOSE 159* 85 228* 192*  BUN 7 9 6 6   CREATININE 0.75 0.92 0.62 0.67  CALCIUM 9.6 9.4 9.5 9.4  MG -- -- -- --  PHOS -- -- -- --   Liver Function Tests:  Lab 12/31/11 0835 12/30/11 0253  AST 28 9  ALT 19 6  ALKPHOS 76 69  BILITOT 0.3 0.4  PROT 7.1 7.3  ALBUMIN 3.6 3.5    Lab 12/31/11 0835 12/30/11 0253 12/29/11 1823  LIPASE 155* 66* 51  AMYLASE -- -- --   No results found for this basename: AMMONIA:5 in the last 168 hours CBC:  Lab 01/04/12 0555 01/01/12 0700 12/31/11 0835 12/30/11 0253 12/29/11 1823  WBC 5.1 5.2 5.2 6.9 6.5  NEUTROABS -- -- -- 3.4 3.6  HGB 13.2 13.3 14.5 14.3 14.1  HCT 40.5 40.9 45.0 42.9 42.0  MCV 95.7 95.6 97.2 94.5 94.0  PLT 198 235 214 198 198   Cardiac Enzymes:  Lab 12/30/11 0253  CKTOTAL --  CKMB --  CKMBINDEX --  TROPONINI <0.30   BNP (last 3 results) No results found for this basename: PROBNP:3 in the last 8760 hours CBG:  Lab 01/05/12 1150 01/04/12 2203 01/04/12 1711  01/04/12 1204 01/04/12 0742  GLUCAP 287* 264* 265* 312* 162*   No results found for this basename: HGBA1C:5 in the last 72 hours Other Labs: No components found with this basename: POCBNP:3 No results found for this basename: DDIMER:2 in the last 168 hours No results found for this basename: CHOL:2,HDL:2,LDLCALC:2,TRIG:2,CHOLHDL:2,LDLDIRECT:2 in the last 168 hours No results found for this basename: TSH,T4TOTAL,FREET3,T3FREE,FREET4,THYROIDAB in the last 168 hours No results found for this basename: VITAMINB12:2,FOLATE:2,FERRITIN:2,TIBC:2,IRON:2,RETICCTPCT:2 in the last 168 hours  Micro Results: Recent Results (from the past 240 hour(s))  MRSA PCR SCREENING     Status: Normal   Collection Time   12/30/11  6:06 AM      Component Value Range Status Comment   MRSA by PCR NEGATIVE  NEGATIVE Final     Studies/Results: No results found.  Medications: I have reviewed the patient's current medications. Scheduled Meds:    . ALPRAZolam  1 mg Oral BID  . aspirin EC  81 mg Oral Daily  . DULoxetine  30 mg Oral Daily  . famotidine  20 mg  Oral BID  . gabapentin  400 mg Oral TID  . insulin aspart  0-9 Units Subcutaneous TID WC  . insulin glargine  5 Units Subcutaneous QHS  . lactulose  20 g Oral BID  . losartan  100 mg Oral Daily  . metoCLOPramide  10 mg Oral TID AC & HS  . ondansetron  4 mg Oral Q6H  . oxyCODONE  20 mg Oral Q12H  . pantoprazole  40 mg Oral BID AC  . promethazine  12.5 mg Oral Q6H  . DISCONTD: oxyCODONE  20 mg Oral Q12H  . DISCONTD: oxyCODONE  50 mg Oral Q12H   Continuous Infusions:    . DISCONTD: sodium chloride 500 mL (01/05/12 0735)   PRN Meds:.acetaminophen, acetaminophen, oxyCODONE, promethazine, promethazine, promethazine, DISCONTD: butamben-tetracaine-benzocaine, DISCONTD: diphenhydrAMINE, DISCONTD: fentaNYL, DISCONTD: midazolam, DISCONTD: oxyCODONE  Assessment/Plan: Abdominal pain with nausea and vomiting Acute on chronic abdominal pain. Etiology unclear.  Lipase elevated, not sure if the patient truly has chronic pancreatitis. More than 9 CT Abd/pelvis over the last year. Gastric emptying scan normal in June.  EGD on 01/05/2012 showed slightly abnormal major papilla, otherwise normal examination. Per GI cutting back on his pain medications, wean the patient off extended release OxyContin after the next dose.  Continue oxycodone IR at his usual home dose of 30 mg q4h.  Continue the patient on scheduled anti-emetics with Zofran Reglan and Phenergan. Continue PPI BID.  Full liquid diet, advance diet to diabetic diet as tolerated.   CAD status post stenting Stable. Continue home medications. Continue aspirin.  Diabetes mellitus type 2 with hypoglycemia Continue lantus at home dose.  Restart glipizide at lower dose. SSI.    Chronic pain syndrome/opioid dependence Discontinue OxyContin. Continue oxycodone at current dose for now.  Hypertension  Stable. Continue home medications.  Prophylaxis SCDs.  Disposition Pending patient's improvement in nausea and vomiting.  Consider possible discharge tomorrow.   LOS: 7 days  Nylah Butkus A, MD 01/05/2012, 2:31 PM

## 2012-01-05 NOTE — H&P (View-Only) (Signed)
HISTORY OF PRESENT ILLNESS:  Ryan [name] is a 60 y.o. male with multiple medical problems including hypertension, insulin requiring diabetes mellitus, morbid obesity, coronary artery disease with prior stent placement, chronic back pain with chronic pain syndrome requiring chronic narcotics, and anxiety. He is sent today regarding the need for followup endoscopy. He was hospitalized in May of 2013 with complaints of abdominal pain, nausea/vomiting. He was evaluated in consultation by Dr. Mann. Imaging studies at that time included CT scan of the abdomen and pelvis as well as gastric emptying study. These were unremarkable. He subsequently underwent upper endoscopy with Dr. Mann on 09/22/2011. Examination revealed erosive esophagitis, ulcers mass at the gastroesophageal junction, prominent antral fold, and possible polypoid lesion of the ampulla. Biopsies revealed nonspecific inflammation. Patient was instructed to avoid NSAIDs and began on PPI therapy. Followup endoscopy in 8 weeks recommended. Patient tells me that he has been compliant with PPI therapy. He also takes H2 receptor antagonist at night. He denies active GERD symptoms. He denies NSAID use. He states that he continues with the same abdominal pain as well as occasional problems with nausea and vomiting. Unchanged from hospital stay. No sustained weight loss. GI review of systems is otherwise negative. He apparently carries the diagnosis of pancreatitis, though this has not been substantiated, as best I can tell. He is status post [facility]cyst[facility] [place] 3-4 years ago. [place] that his multiple chronic medical problems are stable.  REVIEW OF SYSTEMS:  All non-GI ROS negative except for anxiety, arthritis, back pain, insomnia, ankle swelling  Past Medical History  Diagnosis Date  . Hypertension   . Diabetes mellitus   . DVT (deep venous thrombosis)     . Chronic back pain   . 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 Hyperlipidemia   . CAD (coronary atherosclerotic disease)     s/p stents x2  . Small bowel obstruction   . Pancreatitis   . Anxiety     Past Surgical History  Procedure Date  . Coronary angioplasty with stent placement   . Tonsillectomy and adenoidectomy   . Cholecystectomy   . Back surgery   . Esophagogastroduodenoscopy 09/22/2011    Procedure: ESOPHAGOGASTRODUODENOSCOPY (EGD);  Surgeon: Jyothi Mann, MD;  Location: [facility] END[facility]COPY;  Service: Endoscopy;  Laterality: N/A;    Social History Ryan [name]  reports that he has been smoking Cigarettes.  He has a 7.5 pack-year smoking history. He has never used smokeless tobacco. He reports that he does not drink alcohol or use illicit drugs.  family history includes Diabetes in his father.  There is no history of Colon cancer.  Allergies  Allergen Reactions  . Lisinopril Swelling and Rash  . Morphine And Related Swelling and Rash       PHYSICAL EXAMINATION: Vital signs: BP 144/82  Pulse 68  Ht 6' 4" (1.93 m)  Wt 338 lb (153.316 kg)  BMI 41.14 kg/m2  Constitutional: obese,generally well-appearing, no acute distress Psychiatric: alert and oriented x3, cooperative Eyes: extraocular movements intact, anicteric, conjunctiva pink Mouth: oral pharynx moist, no lesions Neck: supple no lymphadenopathy Cardiovascular: heart regular rate and rhythm, no murmur Lungs: clear to auscultation bilaterally Abdomen: soft,obese, complaints of tenderness with minimal palpation throughout, nondistended, no obvious ascites, no peritoneal signs, normal bowel sounds, no organomegaly or mass Extremities: trace lower extremity edema bilaterally Skin: no lesions on visible extremities Neuro: No   focal deficits.   ASSESSMENT:  #1. Chronic abdominal pain. Not clear that his minimal findings on endoscopy substantiate his complaints. #2. Upper endoscopy with  esophagitis, proximal gastric ulcer, and questionable ampullary abnormality #3. Chronic pain syndrome on chronic narcotics #4. Multiple medical problems including coronary artery disease and insulin requiring diabetes mellitus   PLAN:  #1. Followup endoscopy to assess upper GI mucosa as well as evaluate ampullary region. The patient is high-risk given his comorbidities and morbid obesity.The nature of the procedure, as well as the risks, benefits, and alternatives were carefully and thoroughly reviewed with the patient. Ample time for discussion and questions allowed. The patient understood, was satisfied, and agreed to proceed. He states that he was not effectively sedated during his last exam. I recommended CRNA supervised propofol sedation at the hospital. He understands and agrees #2. Hold diabetic medications the morning of his procedure. We will monitor his blood sugar immediately before and after his examination #3. Continue PPI #4. Continue to avoid NSAIDs #5. Ongoing general medical care with Dr. Stallings 

## 2012-01-05 NOTE — Op Note (Signed)
[facility] [address] [place], [address]   ENDOSCOPY PROCEDURE REPORT  PATIENT: Ryan, [name]  MR#: [id number] BIRTHDATE: 1950-09-30 , 60  yrs. old GENDER: Male ENDOSCOPIST: Rachael Fee, MD PROCEDURE DATE:  01/05/2012 PROCEDURE:  EGD w/ biopsy ASA CLASS:     Class III INDICATIONS:  vomiting, chronic abd pains; recent EGD Dr.  Loreta Ave 08/2011 described abnormal GE junction and abnormal major papilla; was scheduled for out patient repeat EGD with Dr.  Marina Goodell to re-evaluated those areas however admitted in the interim with usual chronic pains, worse than usual vomiting. MEDICATIONS: Fentanyl 100 mcg IV, Versed 7 mg IV, and Benadryl 50 mg IV TOPICAL ANESTHETIC: Cetacaine Spray  DESCRIPTION OF PROCEDURE: After the risks benefits and alternatives of the procedure were thoroughly explained, informed consent was obtained.  The Pentax Gastroscope [id number] endoscope was introduced through the mouth and advanced to the second portion of the duodenum. Without limitations.  The instrument was slowly withdrawn as the mucosa was fully examined.   Using side viewing duodenoscope first, the major papilla was well visualized and was somewhat bulbous and protruding.  There was no clearly neoplastic mucosa however mucosal biopsies were taken and sent to pathology.  The examination was otherwise normal. Specifically the GE junction was normal (viewed with standard adult gastroscope in forward view and retroflex view).  Retroflexed views revealed no abnormalities.     The scope was then withdrawn from the patient and the procedure completed. COMPLICATIONS: There were no complications.  ENDOSCOPIC IMPRESSION: Slightly abnormal major papilla, doubt this is pathologic and very unlikely to be contributing to his GI symptoms. Biopsies taken. Otherwise normal examination  RECOMMENDATIONS: Treat clinically with anitemetics. Try to wean off such high doses of chronic  narcotic meds (has been taking 60 oxycontin twice daily and also 30 oxycodone 6 times a day).  These are very likely causing or at least contributing to your chronic GI symptoms. Await final biopsies, I will commmunicate these with Dr.  Marina Goodell.   eSigned:  Rachael Fee, MD 01/05/2012 8:40 AM

## 2012-01-05 NOTE — Interval H&P Note (Signed)
History and Physical Interval Note:  01/05/2012 7:53 AM  Ryan [name]  has presented today for surgery, with the diagnosis of abnormal esophagus, abnormal ampulla 08/2011, also vomiting  The various methods of treatment have been discussed with the patient and family. After consideration of risks, benefits and other options for treatment, the patient has consented to  Procedure(s) (LRB) with comments: ESOPHAGOGASTRODUODENOSCOPY (EGD) (N/A) as a surgical intervention .  The patient's history has been reviewed, patient examined, no change in status, stable for surgery.  I have reviewed the patient's chart and labs.  Questions were answered to the patient's satisfaction.     Rob Bunting

## 2012-01-06 ENCOUNTER — Encounter (HOSPITAL_COMMUNITY): Payer: Self-pay | Admitting: Gastroenterology

## 2012-01-06 LAB — GLUCOSE, CAPILLARY
Glucose-Capillary: 100 mg/dL — ABNORMAL HIGH (ref 70–99)
Glucose-Capillary: 253 mg/dL — ABNORMAL HIGH (ref 70–99)

## 2012-01-06 MED ORDER — OXYCODONE HCL 30 MG PO TABS: 30.0000 mg | ORAL_TABLET | ORAL | Status: DC | PRN

## 2012-01-06 MED ORDER — ALPRAZOLAM 1 MG PO TABS: 1.0000 mg | ORAL_TABLET | Freq: Two times a day (BID) | ORAL | Status: DC

## 2012-01-06 MED ORDER — PROMETHAZINE HCL 12.5 MG PO TABS: 12.5000 mg | ORAL_TABLET | Freq: Four times a day (QID) | ORAL | Status: AC

## 2012-01-06 MED ORDER — METOCLOPRAMIDE HCL 10 MG PO TABS: 10.0000 mg | ORAL_TABLET | Freq: Three times a day (TID) | ORAL | Status: AC

## 2012-01-06 MED ORDER — ONDANSETRON HCL 4 MG PO TABS: 4.0000 mg | ORAL_TABLET | Freq: Four times a day (QID) | ORAL | Status: AC

## 2012-01-06 MED ORDER — PANTOPRAZOLE SODIUM 40 MG PO TBEC: 40.0000 mg | DELAYED_RELEASE_TABLET | Freq: Every day | ORAL | Status: DC

## 2012-01-06 NOTE — Progress Notes (Addendum)
Discussed discharge information with pt, given copy along with new Rx's.  Patient is waiting for his ride home.  Understands his followup appointments, diet and medications to take.  Iv left upper arm d/c without problem.  Skin intact, alert oriented and able to ambulate with walker.

## 2012-01-06 NOTE — Progress Notes (Signed)
Physical Therapy Treatment Patient Details Name: Ryan [name] MRN: [id number] DOB: 1951/01/18 Today's Date: 01/06/2012 Time: 4098-1191 PT Time Calculation (min): 31 min  PT Assessment / Plan / Recommendation Comments on Treatment Session  Pt admitted with pancreatitis and continues to progress well with mobility and ambulation. Pt more alert and responsive today and states he now feels he can manage at home. Will continue to follow    Follow Up Recommendations  Home health PT    Barriers to Discharge        Equipment Recommendations       Recommendations for Other Services    Frequency     Plan Discharge plan needs to be updated;Frequency remains appropriate    Precautions / Restrictions Precautions Precautions: Fall   Pertinent Vitals/Pain 8/10 pain sitting at EOB initially and down to 4-5/10 end of session    Mobility  Bed Mobility Bed Mobility: Not assessed Transfers Transfers: Sit to Stand;Stand to Sit Sit to Stand: 6: Modified independent (Device/Increase time);From bed Stand to Sit: 6: Modified independent (Device/Increase time);To bed;To chair/3-in-1 Details for Transfer Assistance: x 2 trials with controlled descent Ambulation/Gait Ambulation/Gait Assistance: 5: Supervision Ambulation Distance (Feet): 500 Feet Assistive device: Rolling walker Ambulation/Gait Assistance Details: cueing for posture and position in RW but improved RW use from prior visits Gait Pattern: Step-through pattern;Decreased stride length;Trunk flexed Gait velocity: 63ft/26sec=1.15 ft/sec Stairs: No    Exercises General Exercises - Lower Extremity Long Arc Quad: AROM;Both;15 reps;Seated Hip Flexion/Marching: AROM;Both;15 reps;Seated   PT Diagnosis:    PT Problem List:   PT Treatment Interventions:     PT Goals Acute Rehab PT Goals PT Goal: Ambulate - Progress: Progressing toward goal  Visit Information  Last PT Received On: 01/06/12 Assistance Needed: +1    Subjective Data  Subjective: I'm doing better   Cognition  Overall Cognitive Status: Appears within functional limits for tasks assessed/performed Arousal/Alertness: Awake/alert Orientation Level: Appears intact for tasks assessed Behavior During Session: [facility] for tasks performed    Balance     End of Session PT - End of Session Equipment Utilized During Treatment: Gait belt Activity Tolerance: Patient tolerated treatment well Patient left: in chair;with call bell/phone within reach Nurse Communication: Mobility status   GP     [name] 01/06/2012, 8:33 AM [name], PT [phone]

## 2012-01-06 NOTE — Progress Notes (Signed)
Pt. discharged to home. Pt after visit summary reviewed and pt capable of re verbalizing medications and follow up appointments. Pt remains stable. No signs and symptoms of distress. Davinity Fanara E

## 2012-01-06 NOTE — Discharge Summary (Signed)
Physician Discharge Summary  Ryan [name]:[id number] DOB: [birth year] DOA: 12/29/2011  PCP: Warrick Parisian, MD  Admit date: 12/29/2011 Discharge date: 01/06/2012  Recommendations for Outpatient Follow-up:  1. Followup with Dr. Marina Goodell, gastroenterology in one to 2 weeks. 2. Dr. Marina Goodell to address when the patient can be weaned off of antibiotics. 3. Followup with primary care physician in 1 week. 4. Patient given 2 day supply of benzodiazepine and pain medications at discharge, patient understands he is to call primary care physician to get more refills.  Discharge Diagnoses:  Principal Problem:  *Abdominal pain Active Problems:  DIABETES MELLITUS, TYPE II  CORONARY ARTERY DISEASE  Nonspecific (abnormal) findings on radiological and other examination of gastrointestinal tract   Discharge Condition: Stable  Diet recommendation: Diabetic diet  Filed Weights   01/04/12 0603 01/05/12 0419 01/06/12 0439  Weight: 146.8 kg (323 lb 10.2 oz) 146.7 kg (323 lb 6.6 oz) 148 kg (326 lb 4.5 oz)    History of present illness:  61 year-old male with history of CAD status post stenting, chronic pancreatitis, diabetes mellitus2, hypertension presented to the ER because of persistent abdominal pain on 12/30/2011.  Hospital Course:  Abdominal pain with nausea and vomiting Acute on chronic abdominal pain. Etiology unclear. Lipase elevated, not sure if the patient truly has chronic pancreatitis. More than 9 CT Abd/pelvis over the last year. Gastric emptying scan normal in June.  EGD on 01/05/2012 showed slightly abnormal major papilla, otherwise normal examination. GI Dr. Christella Hartigan, recommended cutting back on his pain medications, weaned the patient off extended release OxyContin.  Continue oxycodone IR at his usual home dose of 30 mg q4h, his primary care physician to consider a referral to pain clinic or weaning the patient off of pain medications slowly.  Continue the patient on scheduled anti-emetics with  Zofran Reglan and Phenergan. Continue PPI BID.  Diet advanced to a diabetic diet from clear liquid diet, was tolerating oral intake prior to discharge.   CAD status post stenting Stable. Continue home medications. Continue aspirin.  Diabetes mellitus type 2 with hypoglycemia Initially was hypoglycemic, glipizide was held.  Restart her glipizide at lower dose.  Continued lantus at home dose.  SSI.  Resume home insulin regimen at discharge.  Chronic pain syndrome/opioid dependence Discontinued OxyContin. Continue oxycodone at current dose for now.  Likely will need to be weaned off of pain medication as outpatient, consider pain referral.  Will defer to the primary care physician in weaning the patient off of pain medications.  Hypertension  Stable. Continue home medications.  Procedures:  As above  Consultations:  Dr. Christella Hartigan, gastroenterology.  Discharge Exam: Filed Vitals:   01/05/12 0839 01/05/12 1300 01/05/12 2131 01/06/12 0439  BP:  142/72 128/79 120/73  Pulse:  66 75 71  Temp:  98.2 F (36.8 C) 98.3 F (36.8 C) 97.9 F (36.6 C)  TempSrc:   Oral Oral  Resp: 23 18 18 18   Height:      Weight:    148 kg (326 lb 4.5 oz)  SpO2:  96% 92% 95%   Discharge Instructions  Discharge Orders    Future Orders Please Complete By Expires   Diet Carb Modified      Increase activity slowly      Discharge instructions      Comments:   Please followup with Dr. Marina Goodell, gastroenterology in 1 to 2 weeks, discussed with Dr. Marina Goodell when he can, off of anti-emetics (Zofran, Phenergan, Reglan). Follow up with your primary care physician in 1  week.     Medication List  As of 01/06/2012 12:05 PM   STOP taking these medications         Oxycodone HCl 60 MG Tb12         TAKE these medications         ALPRAZolam 1 MG tablet   Commonly known as: XANAX   Take 1 tablet (1 mg total) by mouth 2 (two) times daily.      aspirin EC 81 MG tablet   Take 81 mg by mouth daily.      DULoxetine 30 MG  capsule   Commonly known as: CYMBALTA   Take 1 capsule (30 mg total) by mouth daily.      gabapentin 400 MG capsule   Commonly known as: NEURONTIN   Take 400 mg by mouth 3 (three) times daily.      glipiZIDE 10 MG 24 hr tablet   Commonly known as: GLUCOTROL XL   Take 10 mg by mouth 2 (two) times daily.      insulin aspart 100 UNIT/ML injection   Commonly known as: novoLOG   Inject 15 Units into the skin 3 (three) times daily with meals.      insulin glargine 100 UNIT/ML injection   Commonly known as: LANTUS   Inject 10 Units into the skin at bedtime.      lactulose 10 GM/15ML solution   Commonly known as: CHRONULAC   Take 20 g by mouth 2 (two) times daily.      losartan 100 MG tablet   Commonly known as: COZAAR   Take 100 mg by mouth daily.      metFORMIN 850 MG tablet   Commonly known as: GLUCOPHAGE   Take 850 mg by mouth 3 (three) times daily.      metoCLOPramide 10 MG tablet   Commonly known as: REGLAN   Take 1 tablet (10 mg total) by mouth 4 (four) times daily -  before meals and at bedtime.      ondansetron 4 MG tablet   Commonly known as: ZOFRAN   Take 1 tablet (4 mg total) by mouth every 6 (six) hours.      oxycodone 30 MG immediate release tablet   Commonly known as: ROXICODONE   Take 1 tablet (30 mg total) by mouth every 4 (four) hours as needed. For breakthrough pain      pantoprazole 40 MG tablet   Commonly known as: PROTONIX   Take 1 tablet (40 mg total) by mouth daily.      promethazine 12.5 MG tablet   Commonly known as: PHENERGAN   Take 1 tablet (12.5 mg total) by mouth every 6 (six) hours.      sennosides-docusate sodium 8.6-50 MG tablet   Commonly known as: SENOKOT-S   Take 1 tablet by mouth 2 (two) times daily as needed. For constipation      ZANTAC PO   Take 300 mg by mouth daily.           Follow-up Information    Follow up with Warrick Parisian, MD. Schedule an appointment as soon as possible for a visit in 2 days.   Contact  information:   [address] [place] [address] [phone]       Follow up with Yancey Flemings, MD. Schedule an appointment as soon as possible for a visit in 1 week.   Contact information:   [address] [place] [address] [place]  [place] [address] [phone]           The results of significant diagnostics from this hospitalization (including imaging, microbiology, ancillary and laboratory) are listed below for reference.    Significant Diagnostic Studies: Dg Chest 2 View  12/25/2011  *RADIOLOGY REPORT*  Clinical Data: Chest tightness and heaviness.  CHEST - 2 VIEW  Comparison: 06/14/2011.  Findings: Trachea is midline.  Heart size normal.  Lungs are somewhat low in volume with mild perihilar and bibasilar predominant air space disease.  No pleural fluid.  IMPRESSION: Perihilar and bibasilar predominant subtle air space disease, possibly due to edema.   Original Report Authenticated By: Reyes Ivan, M.D.    Ct Abdomen Pelvis W Contrast  12/20/2011  *RADIOLOGY REPORT*  Clinical Data: Abdominal pain.  CT ABDOMEN AND PELVIS WITH CONTRAST  Technique:  Multidetector CT imaging of the abdomen and pelvis was performed following the standard protocol during bolus administration of intravenous contrast.  Contrast: OMNIPAQUE IOHEXOL 300 MG/ML  SOLN  Comparison: 08/22/2011  Findings: Mild ground-glass opacities at the lung bases.  Heart size within normal limits.  No pleural or pericardial effusion.  Low attenuation of the liver is nonspecific post contrast however can be seen with fatty infiltration. Unremarkable spleen. Mild prominence of the main pancreatic duct, measuring up to 4 mm to the level of the ampulla of where there is soft tissue fullness (image 35 series 2). Absent gallbladder. Mild central biliary ductal dilatation is nonspecific in this setting.  There is a 2.2 cm right adrenal nodule, incompletely characterized however without  significant interval change in retrospect.  Unremarkable left adrenal gland.  Mild perinephric fat stranding.  Nonspecific hypodensity arising from the lower pole of the left kidney, similar in size to prior however remains incompletely characterized.  No hydronephrosis or hydroureter.  The mild distension of the stomach and proximal small bowel.  No overt evidence for obstruction.  Normal appendix.  Subcentimeter ileocolic lymph node.  No free intraperitoneal air or fluid.  There is scattered atherosclerotic calcification of the aorta and its branches. No aneurysmal dilatation.  Circumferential bladder wall thickening is nonspecific given incomplete distension.  Nonspecific prostatic calcifications.  Small fat containing umbilical hernia.  Bilateral SI joint and multilevel vertebral body degenerative changes.  Postoperative changes of the lower lumbar spine.  IMPRESSION: Minimal perinephric fat stranding.  Correlate with urinalysis if concerned for ascending infection.  Mild bibasilar ground-glass opacities; can be seen with chronic changes, atypical infection, or mild edema in the appropriate clinical setting.  Mild distension of the stomach and proximal small bowel without overt evidence of obstruction.  Incompletely characterized right adrenal lesion and left renal lesion.  Recommend MRI.  There is mild main pancreatic and central biliary ductal prominence to the level of soft tissue fullness at the ampulla.  This can also be better characterized at MRI with the addition of an MRCP.   Original Report Authenticated By: Waneta Martins, M.D.    Dg Abd Acute W/chest  12/30/2011  *RADIOLOGY REPORT*  Clinical Data: Nausea, vomiting, abdominal pain, past history of gastric ulcers and cholecystectomy  ACUTE ABDOMEN SERIES (ABDOMEN 2 VIEW & CHEST 1 VIEW)  Comparison: Chest radiograph 12/25/2011, abdominal radiographs of 11/01/2011  Findings: Normal heart size, mediastinal contours, and pulmonary vascularity.  Chronic perihilar and basilar lung changes without acute infiltrate or effusion. Nonobstructive bowel gas pattern. No bowel dilatation, bowel wall thickening or free intraperitoneal air. Prior L4-L5 lumbar fusion. Surgical clips right upper quadrant question cholecystectomy.  No definite urinary tract calcification.  IMPRESSION: No acute abnormalities.   Original Report Authenticated By: Lollie Marrow, M.D.     Microbiology: Recent Results (from the past 240 hour(s))  MRSA PCR SCREENING     Status: Normal   Collection Time   12/30/11  6:06 AM      Component Value Range Status Comment   MRSA by PCR NEGATIVE  NEGATIVE Final      Labs: Basic Metabolic Panel:  Lab 01/04/12 1610 12/31/11 0835  NA 142 140  K 4.2 4.4  CL 102 104  CO2 35* 28  GLUCOSE 159* 85  BUN 7 9  CREATININE 0.75 0.92  CALCIUM 9.6 9.4  MG -- --  PHOS -- --   Liver Function Tests:  Lab 12/31/11 0835  AST 28  ALT 19  ALKPHOS 76  BILITOT 0.3  PROT 7.1  ALBUMIN 3.6    Lab 12/31/11 0835  LIPASE 155*  AMYLASE --   No results found for this basename: AMMONIA:5 in the last 168 hours CBC:  Lab 01/04/12 0555 01/01/12 0700 12/31/11 0835  WBC 5.1 5.2 5.2  NEUTROABS -- -- --  HGB 13.2 13.3 14.5  HCT 40.5 40.9 45.0  MCV 95.7 95.6 97.2  PLT 198 235 214   Cardiac Enzymes: No results found for this basename: CKTOTAL:5,CKMB:5,CKMBINDEX:5,TROPONINI:5 in the last 168 hours BNP: BNP (last 3 results) No results found for this basename: PROBNP:3 in the last 8760 hours CBG:  Lab 01/06/12 0743 01/05/12 2151 01/05/12 1642 01/05/12 1150 01/04/12 2203  GLUCAP 100* 217* 380* 287* 264*    Time coordinating discharge: 40 minutes  Signed:  Caileb Rhue A  [facility] 01/06/2012, 12:05 PM

## 2012-01-06 NOTE — Progress Notes (Signed)
Subjective: Vomited yesterday after eating solid food.  Tolerated breath is better and was able to keep down.  Objective: Vital signs in last 24 hours: Filed Vitals:   01/05/12 0839 01/05/12 1300 01/05/12 2131 01/06/12 0439  BP:  142/72 128/79 120/73  Pulse:  66 75 71  Temp:  98.2 F (36.8 C) 98.3 F (36.8 C) 97.9 F (36.6 C)  TempSrc:   Oral Oral  Resp: 23 18 18 18   Height:      Weight:    148 kg (326 lb 4.5 oz)  SpO2:  96% 92% 95%   Weight change: 1.3 kg (2 lb 13.9 oz)  Intake/Output Summary (Last 24 hours) at 01/06/12 1158 Last data filed at 01/06/12 0900  Gross per 24 hour  Intake   1160 ml  Output      0 ml  Net   1160 ml    Physical Exam: General: Awake, Oriented, No acute distress. HEENT: EOMI. Neck: Supple CV: S1 and S2 Lungs: Clear to ascultation bilaterally Abdomen: Soft, Generalized tenderness, Nondistended, +bowel sounds. Ext: Good pulses. Trace edema.  Lab Results: Basic Metabolic Panel:  Lab 01/04/12 0981 12/31/11 0835  NA 142 140  K 4.2 4.4  CL 102 104  CO2 35* 28  GLUCOSE 159* 85  BUN 7 9  CREATININE 0.75 0.92  CALCIUM 9.6 9.4  MG -- --  PHOS -- --   Liver Function Tests:  Lab 12/31/11 0835  AST 28  ALT 19  ALKPHOS 76  BILITOT 0.3  PROT 7.1  ALBUMIN 3.6    Lab 12/31/11 0835  LIPASE 155*  AMYLASE --   No results found for this basename: AMMONIA:5 in the last 168 hours CBC:  Lab 01/04/12 0555 01/01/12 0700 12/31/11 0835  WBC 5.1 5.2 5.2  NEUTROABS -- -- --  HGB 13.2 13.3 14.5  HCT 40.5 40.9 45.0  MCV 95.7 95.6 97.2  PLT 198 235 214   Cardiac Enzymes: No results found for this basename: CKTOTAL:5,CKMB:5,CKMBINDEX:5,TROPONINI:5 in the last 168 hours BNP (last 3 results) No results found for this basename: PROBNP:3 in the last 8760 hours CBG:  Lab 01/06/12 0743 01/05/12 2151 01/05/12 1642 01/05/12 1150 01/04/12 2203  GLUCAP 100* 217* 380* 287* 264*   No results found for this basename: HGBA1C:5 in the last 72  hours Other Labs: No components found with this basename: POCBNP:3 No results found for this basename: DDIMER:2 in the last 168 hours No results found for this basename: CHOL:2,HDL:2,LDLCALC:2,TRIG:2,CHOLHDL:2,LDLDIRECT:2 in the last 168 hours No results found for this basename: TSH,T4TOTAL,FREET3,T3FREE,FREET4,THYROIDAB in the last 168 hours No results found for this basename: VITAMINB12:2,FOLATE:2,FERRITIN:2,TIBC:2,IRON:2,RETICCTPCT:2 in the last 168 hours  Micro Results: Recent Results (from the past 240 hour(s))  MRSA PCR SCREENING     Status: Normal   Collection Time   12/30/11  6:06 AM      Component Value Range Status Comment   MRSA by PCR NEGATIVE  NEGATIVE Final     Studies/Results: No results found.  Medications: I have reviewed the patient's current medications. Scheduled Meds:    . ALPRAZolam  1 mg Oral BID  . aspirin EC  81 mg Oral Daily  . DULoxetine  30 mg Oral Daily  . famotidine  20 mg Oral BID  . gabapentin  400 mg Oral TID  . glipiZIDE  2.5 mg Oral BID AC  . insulin aspart  0-9 Units Subcutaneous TID WC  . insulin glargine  10 Units Subcutaneous QHS  . lactulose  20 g  Oral BID  . losartan  100 mg Oral Daily  . metoCLOPramide  10 mg Oral TID AC & HS  . ondansetron  4 mg Oral Q6H  . oxyCODONE  20 mg Oral Q12H  . pantoprazole  40 mg Oral BID AC  . promethazine  12.5 mg Oral Q6H  . DISCONTD: insulin glargine  5 Units Subcutaneous QHS  . DISCONTD: oxyCODONE  20 mg Oral Q12H   Continuous Infusions:   PRN Meds:.acetaminophen, acetaminophen, oxyCODONE, promethazine, promethazine, promethazine  Assessment/Plan: Abdominal pain with nausea and vomiting Acute on chronic abdominal pain. Etiology unclear. Lipase elevated, not sure if the patient truly has chronic pancreatitis. More than 9 CT Abd/pelvis over the last year. Gastric emptying scan normal in June.  EGD on 01/05/2012 showed slightly abnormal major papilla, otherwise normal examination. Per GI cutting back  on his pain medications, weaned the patient off extended release OxyContin.  Continue oxycodone IR at his usual home dose of 30 mg q4h.  Continue the patient on scheduled anti-emetics with Zofran Reglan and Phenergan. Continue PPI BID.  Diet advanced to a diabetic diet.   CAD status post stenting Stable. Continue home medications. Continue aspirin.  Diabetes mellitus type 2 with hypoglycemia Continue lantus at home dose.  Restart glipizide at lower dose. SSI.  Resume home insulin regimen at discharge per  Chronic pain syndrome/opioid dependence Discontinued OxyContin. Continue oxycodone at current dose for now.  Likely will need to be weaned off of pain medication as outpatient, consider pain referral.  Will defer to the primary care physician in weaning the patient off of pain medications.  Hypertension  Stable. Continue home medications.  Prophylaxis SCDs.  Disposition Discharge the patient today.   LOS: 8 days  Teancum Brule A, MD 01/06/2012, 11:58 AM

## 2012-01-10 ENCOUNTER — Telehealth: Payer: Self-pay | Admitting: Gastroenterology

## 2012-01-10 NOTE — Telephone Encounter (Signed)
Pt was given pathology results

## 2012-01-14 ENCOUNTER — Ambulatory Visit (HOSPITAL_COMMUNITY): Admission: RE | Admit: 2012-01-14 | Payer: Medicare Other | Source: Ambulatory Visit | Admitting: Internal Medicine

## 2012-01-14 ENCOUNTER — Encounter (HOSPITAL_COMMUNITY): Admission: RE | Payer: Self-pay | Source: Ambulatory Visit

## 2012-01-14 SURGERY — EGD (ESOPHAGOGASTRODUODENOSCOPY)
Anesthesia: Monitor Anesthesia Care

## 2012-01-23 ENCOUNTER — Encounter (HOSPITAL_COMMUNITY): Payer: Self-pay | Admitting: Emergency Medicine

## 2012-01-23 ENCOUNTER — Emergency Department (HOSPITAL_COMMUNITY): Payer: Medicare Other

## 2012-01-23 ENCOUNTER — Emergency Department (HOSPITAL_COMMUNITY)
Admission: EM | Admit: 2012-01-23 | Discharge: 2012-01-23 | Disposition: A | Discharge status: Home / Self Care | Payer: Medicare Other | Attending: Emergency Medicine | Admitting: Emergency Medicine

## 2012-01-23 DIAGNOSIS — Z86718 Personal history of other venous thrombosis and embolism: Secondary | ICD-10-CM | POA: Insufficient documentation

## 2012-01-23 DIAGNOSIS — E119 Type 2 diabetes mellitus without complications: Secondary | ICD-10-CM | POA: Insufficient documentation

## 2012-01-23 DIAGNOSIS — Z794 Long term (current) use of insulin: Secondary | ICD-10-CM | POA: Insufficient documentation

## 2012-01-23 DIAGNOSIS — Z9089 Acquired absence of other organs: Secondary | ICD-10-CM | POA: Insufficient documentation

## 2012-01-23 DIAGNOSIS — Z79899 Other long term (current) drug therapy: Secondary | ICD-10-CM | POA: Insufficient documentation

## 2012-01-23 DIAGNOSIS — I10 Essential (primary) hypertension: Secondary | ICD-10-CM | POA: Insufficient documentation

## 2012-01-23 DIAGNOSIS — I2510 Atherosclerotic heart disease of native coronary artery without angina pectoris: Secondary | ICD-10-CM | POA: Insufficient documentation

## 2012-01-23 DIAGNOSIS — R112 Nausea with vomiting, unspecified: Secondary | ICD-10-CM

## 2012-01-23 DIAGNOSIS — R109 Unspecified abdominal pain: Secondary | ICD-10-CM

## 2012-01-23 LAB — BASIC METABOLIC PANEL
BUN: 11 mg/dL (ref 6–23)
Calcium: 10 mg/dL (ref 8.4–10.5)
GFR calc Af Amer: >90 mL/min (ref >90)
GFR calc non Af Amer: >90 mL/min (ref >90)
Potassium: 3.4 mEq/L — ABNORMAL LOW (ref 3.5–5.1)
Sodium: 137 mEq/L (ref 135–145)

## 2012-01-23 LAB — CBC WITH DIFFERENTIAL/PLATELET
Basophils Absolute: 0 10*3/uL (ref 0.0–0.1)
Eosinophils Absolute: 0 10*3/uL (ref 0.0–0.7)
Lymphocytes Relative: 26 % (ref 12–46)
Lymphs Abs: 2.5 10*3/uL (ref 0.7–4.0)
Monocytes Relative: 9 % (ref 3–12)
Platelets: 296 10*3/uL (ref 150–400)
RDW: 14 % (ref 11.5–15.5)
WBC: 9.8 10*3/uL (ref 4.0–10.5)

## 2012-01-23 LAB — HEPATIC FUNCTION PANEL
Albumin: 3.9 g/dL (ref 3.5–5.2)
Total Bilirubin: 0.4 mg/dL (ref 0.3–1.2)

## 2012-01-23 MED ORDER — SODIUM CHLORIDE 0.9 % IV BOLUS (SEPSIS)
1000.0000 mL | Freq: Once | INTRAVENOUS | Status: AC
  Administered 2012-01-23: 1000 mL via INTRAVENOUS

## 2012-01-23 MED ORDER — IOHEXOL 300 MG/ML  SOLN
20.0000 mL | INTRAMUSCULAR | Status: AC
  Administered 2012-01-23 (×2): 20 mL via ORAL

## 2012-01-23 MED ORDER — ONDANSETRON HCL 4 MG PO TABS: 4.0000 mg | ORAL_TABLET | Freq: Four times a day (QID) | ORAL | Status: DC

## 2012-01-23 MED ORDER — ONDANSETRON HCL 4 MG/2ML IJ SOLN
4.0000 mg | Freq: Once | INTRAMUSCULAR | Status: AC
  Administered 2012-01-23: 4 mg via INTRAVENOUS
  Filled 2012-01-23: qty 2

## 2012-01-23 MED ORDER — HYDROMORPHONE HCL PF 1 MG/ML IJ SOLN
1.0000 mg | Freq: Once | INTRAMUSCULAR | Status: AC
  Administered 2012-01-23: 1 mg via INTRAVENOUS
  Filled 2012-01-23: qty 1

## 2012-01-23 MED ORDER — IOHEXOL 300 MG/ML  SOLN
100.0000 mL | Freq: Once | INTRAMUSCULAR | Status: AC | PRN
  Administered 2012-01-23: 100 mL via INTRAVENOUS

## 2012-01-23 MED ORDER — PROMETHAZINE HCL 25 MG RE SUPP: 25.0000 mg | Freq: Four times a day (QID) | RECTAL | Status: DC | PRN

## 2012-01-23 MED ORDER — OXYCODONE-ACETAMINOPHEN 5-325 MG PO TABS
2.0000 | ORAL_TABLET | Freq: Once | ORAL | Status: AC
  Administered 2012-01-23: 2 via ORAL
  Filled 2012-01-23: qty 2

## 2012-01-23 MED ORDER — OXYCODONE-ACETAMINOPHEN 5-325 MG PO TABS: 1.0000 | ORAL_TABLET | Freq: Four times a day (QID) | ORAL | Status: DC | PRN

## 2012-01-23 NOTE — ED Notes (Signed)
Patient transported to CT 

## 2012-01-23 NOTE — ED Provider Notes (Signed)
History     CSN: [id number]  Arrival date & time 01/23/12  1202   First MD Initiated Contact with Patient 01/23/12 1240      Chief Complaint  Patient presents with  . Abdominal Pain  . Emesis    (Consider location/radiation/quality/duration/timing/severity/associated sxs/prior treatment) HPI Comments: 61 year old male presents to the emergency department with lower abdominal pain worsening over the past 5 days. Admits to associated nausea and vomiting. He states he has been unable to keep anything down including his medications which includes oxycodone. Admits to associated diarrhea for the past 5 days, however he had one normal bowel movement 3 days ago. Admits to a fever of 103.7 a couple days ago and states he has had intermittent fever and chills. Pain is constant rated 10/10 radiating to the right side of his back. Denies chest pain, shortness of breath, or any urinary symptoms.  The history is provided by the patient.    Past Medical History  Diagnosis Date  . Hypertension   . Diabetes mellitus   . DVT (deep venous thrombosis)   . Chronic back pain   . 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 Hyperlipidemia   . CAD (coronary atherosclerotic disease)     s/p stents x2  . Small bowel obstruction   . Pancreatitis   . Anxiety   . GERD (gastroesophageal reflux disease)     Past Surgical History  Procedure Date  . Coronary angioplasty with stent placement   . Tonsillectomy and adenoidectomy   . Cholecystectomy   . Back surgery   . Esophagogastroduodenoscopy 09/22/2011    Procedure: ESOPHAGOGASTRODUODENOSCOPY (EGD);  Surgeon: Charna Elizabeth, MD;  Location: [facility] ENDOSCOPY;  Service: Endoscopy;  Laterality: N/A;  . Tonsillectomy   . Esophagogastroduodenoscopy 01/05/2012    Procedure: ESOPHAGOGASTRODUODENOSCOPY (EGD);  Surgeon: Rachael Fee, MD;  Location: [facility] ENDOSCOPY;  Service: Endoscopy;  Laterality: N/A;    Family History  Problem Relation Age of  Onset  . Diabetes Father   . Colon cancer Neg Hx     History  Substance Use Topics  . Smoking status: Current Some Day Smoker -- 0.2 packs/day for 30 years    Types: Cigarettes  . Smokeless tobacco: Never Used  . Alcohol Use: Yes     occ      Review of Systems  Constitutional: Positive for fever, chills and appetite change.  HENT: Negative for neck pain and neck stiffness.   Respiratory: Negative for shortness of breath.   Cardiovascular: Negative for chest pain.  Gastrointestinal: Positive for nausea, vomiting, abdominal pain and diarrhea. Negative for blood in stool.  Genitourinary: Negative for dysuria, hematuria and difficulty urinating.  Musculoskeletal: Positive for back pain.  Skin: Negative for color change and rash.  Neurological: Negative for dizziness, weakness and light-headedness.  Psychiatric/Behavioral: Negative for confusion.    Allergies  Lisinopril and Morphine and related  Home Medications   Current Outpatient Rx  Name Route Sig Dispense Refill  . ALPRAZOLAM 1 MG PO TABS Oral Take 1 tablet (1 mg total) by mouth 2 (two) times daily. 4 tablet 0  . ASPIRIN EC 81 MG PO TBEC Oral Take 81 mg by mouth daily.    . DULOXETINE HCL 30 MG PO CPEP Oral Take 1 capsule (30 mg total) by mouth daily.    Marland Kitchen GABAPENTIN 400 MG PO CAPS Oral Take 400 mg by mouth 3 (three) times daily.    Marland Kitchen GLIPIZIDE ER 10 MG PO TB24 Oral  Take 10 mg by mouth 2 (two) times daily.    . INSULIN ASPART 100 UNIT/ML [place] SOLN Subcutaneous Inject 15 Units into the skin 3 (three) times daily with meals.    . INSULIN GLARGINE 100 UNIT/ML [place] SOLN Subcutaneous Inject 10 Units into the skin at bedtime.    Marland Kitchen LACTULOSE 10 GM/15ML PO SOLN Oral Take 20 g by mouth 2 (two) times daily.    Marland Kitchen LOSARTAN POTASSIUM 100 MG PO TABS Oral Take 100 mg by mouth daily.    Marland Kitchen METFORMIN HCL 850 MG PO TABS Oral Take 850 mg by mouth 3 (three) times daily.    . OXYCODONE HCL 30 MG PO TABS Oral Take 1 tablet (30 mg total) by mouth  every 4 (four) hours as needed. For breakthrough pain 12 tablet 0  . PANTOPRAZOLE SODIUM 40 MG PO TBEC Oral Take 1 tablet (40 mg total) by mouth daily. 30 tablet 0  . ZANTAC PO Oral Take 300 mg by mouth daily.     . SENNA-DOCUSATE SODIUM 8.6-50 MG PO TABS Oral Take 1 tablet by mouth 2 (two) times daily as needed. For constipation      BP 139/75  Pulse 74  Temp 98.4 F (36.9 C) (Oral)  Resp 20  SpO2 94%  Physical Exam  Nursing note and vitals reviewed. Constitutional: He is oriented to person, place, and time.       Morbidly obese  HENT:  Head: Normocephalic and atraumatic.  Mouth/Throat: Oropharynx is clear and moist.  Eyes: Conjunctivae normal are normal.  Neck: Normal range of motion. Neck supple.  Cardiovascular: Normal rate, regular rhythm, normal heart sounds and intact distal pulses.   Pulmonary/Chest: Effort normal and breath sounds normal.  Abdominal: Soft. Normal appearance and bowel sounds are normal. He exhibits no mass. There is tenderness in the periumbilical area, left upper quadrant and left lower quadrant. There is guarding. There is no rigidity, no rebound and no CVA tenderness.  Musculoskeletal: Normal range of motion.  Neurological: He is alert and oriented to person, place, and time.  Skin: Skin is warm and dry.  Psychiatric: He has a normal mood and affect. His behavior is normal.    ED Course  Procedures (including critical care time)   Labs Reviewed  CBC WITH DIFFERENTIAL  BASIC METABOLIC PANEL  LIPASE, BLOOD  URINALYSIS, ROUTINE W REFLEX MICROSCOPIC  HEPATIC FUNCTION PANEL   Results for orders placed during the hospital encounter of 01/23/12  CBC WITH DIFFERENTIAL      Component Value Range   WBC 9.8  4.0 - 10.5 K/uL   RBC 5.07  4.22 - 5.81 MIL/uL   Hemoglobin 15.7  13.0 - 17.0 g/dL   HCT 96.0  45.4 - 09.8 %   MCV 92.7  78.0 - 100.0 fL   MCH 31.0  26.0 - 34.0 pg   MCHC 33.4  30.0 - 36.0 g/dL   RDW 11.9  14.7 - 82.9 %   Platelets 296  150 -  400 K/uL   Neutrophils Relative 65  43 - 77 %   Lymphocytes Relative 26  12 - 46 %   Monocytes Relative 9  3 - 12 %   Eosinophils Relative 0  0 - 5 %   Basophils Relative 0  0 - 1 %   Neutro Abs 6.4  1.7 - 7.7 K/uL   Lymphs Abs 2.5  0.7 - 4.0 K/uL   Monocytes Absolute 0.9  0.1 - 1.0 K/uL   Eosinophils Absolute  0.0  0.0 - 0.7 K/uL   Basophils Absolute 0.0  0.0 - 0.1 K/uL   RBC Morphology POLYCHROMASIA PRESENT     WBC Morphology ATYPICAL LYMPHOCYTES    BASIC METABOLIC PANEL      Component Value Range   Sodium 137  135 - 145 mEq/L   Potassium 3.4 (*) 3.5 - 5.1 mEq/L   Chloride 100  96 - 112 mEq/L   CO2 22  19 - 32 mEq/L   Glucose, Bld 213 (*) 70 - 99 mg/dL   BUN 11  6 - 23 mg/dL   Creatinine, Ser 2.13  0.50 - 1.35 mg/dL   Calcium 08.6  8.4 - 57.8 mg/dL   GFR calc non Af Amer >90  >90 mL/min   GFR calc Af Amer >90  >90 mL/min  LIPASE, BLOOD      Component Value Range   Lipase 44  11 - 59 U/L  HEPATIC FUNCTION PANEL      Component Value Range   Total Protein 7.8  6.0 - 8.3 g/dL   Albumin 3.9  3.5 - 5.2 g/dL   AST 11  0 - 37 U/L   ALT 9  0 - 53 U/L   Alkaline Phosphatase 75  39 - 117 U/L   Total Bilirubin 0.4  0.3 - 1.2 mg/dL   Bilirubin, Direct <4.6  0.0 - 0.3 mg/dL   Indirect Bilirubin NOT CALCULATED  0.3 - 0.9 mg/dL    No results found.   No diagnosis found.    MDM  61 y/o male with chronic abdominal pain presenting with 5 days of worsening pain, nausea and vomiting. Labs without any acute abnormality concerning pancreatitis. He is tender in LLQ on exam concerning possible diverticulitis. Obtaining CT scan. Patient will be moved to CDU to wait for CT scan and continue pain/nausea control. Case discussed with Marlon Pel, PA-C who will take over care of patient in CDU at this time.        Trevor Mace, PA-C 01/23/12 1539

## 2012-01-23 NOTE — ED Notes (Signed)
Spoke with CDU RN unable to take pt at this time due to being full. Will notify when space available.

## 2012-01-23 NOTE — ED Notes (Signed)
Pt requesting more pain medication. PA made aware, reporting no more pain medicine at this time. Patient made aware

## 2012-01-23 NOTE — ED Notes (Signed)
CT made aware pt finished with contrast. Pt resting, notified need for urine sample, unable to provide at this time. Urinal on bed.

## 2012-01-23 NOTE — ED Notes (Signed)
Pt here c/o lower abd pain with N/V x 4 days; pt sts unable to keep down PO meds

## 2012-01-23 NOTE — ED Notes (Signed)
Pt reporting n/v/d x 5 days. Unable to keep food/drink down due to n/v. C/o left abdominal pain. Skin warm and moist. Reporting abdominal pain 10/10 constant. Pt is a x 4. Establishing IV at this time.

## 2012-01-23 NOTE — ED Notes (Signed)
Pt. Stable, no distress noted. Verbalized understanding of discharge instructions. Pt. States has ride to pick him up.

## 2012-01-23 NOTE — ED Provider Notes (Signed)
Pt to the CDU from [place], PA-C for holding pending CT scan of the abdomen and pelvis for abdominal pain and vomiting with diarrhea. The patient has multiple visits in the ED for the same. He has been admitted before for cyclical vomiting and has had abdominal surgeries in the past.  Patients labs are WNL and CT scan does not show findings to describe patients pain or any acute findings that warrant admission.  I have spoken with Angelene Giovanni, RN and the nurse tech that cared for patient before transfer to CDU who says that the patient was drinking water in the ED and has not had a single episode of vomiting or diarrhea in the ED today. I have spoken with the Nurse caring for patient in the CDU now and she denies him having any episodes of vomiting or diarrhea in the CDU. The patient ha been here for 5 hours and 35 minutes without any episodes in the ED. He is asking for admission but with no N/V or fever and an essentially normal CT scan he does not meet admission criteria at the time.  Patient has OXy 30mg , Phenergan supp and Zofran PO at home and says they are not helping. I have written him for a small dose of percocet, some zofran and phenergan supp. I have advised him that if he is not getting better and keeping foods down then he will need to come back. 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   PE:  abd- re-eval of abdomen shows a soft abd, with mild diffuse discomfort to palpation.  Results for orders placed during the hospital encounter of 01/23/12  CBC WITH DIFFERENTIAL      Component Value Range   WBC 9.8  4.0 - 10.5 K/uL   RBC 5.07  4.22 - 5.81 MIL/uL   Hemoglobin 15.7  13.0 - 17.0 g/dL   HCT 96.0  45.4 - 09.8 %   MCV 92.7  78.0 - 100.0 fL   MCH 31.0  26.0 - 34.0 pg   MCHC 33.4  30.0 - 36.0 g/dL   RDW 11.9  14.7 - 82.9 %   Platelets 296  150 - 400 K/uL   Neutrophils Relative 65  43 - 77 %   Lymphocytes Relative 26  12 - 46 %   Monocytes Relative 9  3 - 12 %   Eosinophils Relative 0  0 - 5 %   Basophils Relative 0  0 - 1 %   Neutro Abs 6.4  1.7 - 7.7 K/uL   Lymphs Abs 2.5  0.7 - 4.0 K/uL   Monocytes Absolute 0.9  0.1 - 1.0 K/uL   Eosinophils Absolute 0.0  0.0 - 0.7 K/uL   Basophils Absolute 0.0  0.0 - 0.1 K/uL   RBC Morphology POLYCHROMASIA PRESENT     WBC Morphology ATYPICAL LYMPHOCYTES    BASIC METABOLIC PANEL      Component Value Range   Sodium 137  135 - 145 mEq/L   Potassium 3.4 (*) 3.5 - 5.1 mEq/L   Chloride 100  96 - 112 mEq/L   CO2 22  19 - 32 mEq/L   Glucose, Bld 213 (*) 70 - 99 mg/dL   BUN 11  6 - 23 mg/dL   Creatinine, Ser 5.62  0.50 - 1.35 mg/dL   Calcium 13.0  8.4 - 86.5 mg/dL   GFR calc non  Af Amer >90  >90 mL/min   GFR calc Af Amer >90  >90 mL/min  LIPASE, BLOOD      Component Value Range   Lipase 44  11 - 59 U/L  HEPATIC FUNCTION PANEL      Component Value Range   Total Protein 7.8  6.0 - 8.3 g/dL   Albumin 3.9  3.5 - 5.2 g/dL   AST 11  0 - 37 U/L   ALT 9  0 - 53 U/L   Alkaline Phosphatase 75  39 - 117 U/L   Total Bilirubin 0.4  0.3 - 1.2 mg/dL   Bilirubin, Direct <1.6  0.0 - 0.3 mg/dL   Indirect Bilirubin NOT CALCULATED  0.3 - 0.9 mg/dL   Dg Chest 2 View  05/08/6043  *RADIOLOGY REPORT*  Clinical Data: Chest tightness and heaviness.  CHEST - 2 VIEW  Comparison: 06/14/2011.  Findings: Trachea is midline.  Heart size normal.  Lungs are somewhat low in volume with mild perihilar and bibasilar predominant air space disease.  No pleural fluid.  IMPRESSION: Perihilar and bibasilar predominant subtle air space disease, possibly due to edema.   Original Report Authenticated By: Reyes Ivan, M.D.    Ct Abdomen Pelvis W Contrast  01/23/2012  *RADIOLOGY REPORT*  Clinical Data: Abdominal pain.  Emesis.  CT ABDOMEN AND PELVIS WITH CONTRAST  Technique:  Multidetector CT imaging of the abdomen and pelvis was performed following the standard protocol during  bolus administration of intravenous contrast.  Contrast: OMNIPAQUE IOHEXOL 300 MG/ML  SOLN  Comparison: CT of the abdomen and pelvis 12/20/2011.  Findings:  Lung Bases: Ill-defined peribronchovascular ground-glass attenuation throughout the lower lobes of the lungs bilaterally may suggest sequelae of very mild aspiration.  Abdomen/Pelvis:  Status post cholecystectomy.  The appearance of the liver, pancreas, spleen, left adrenal gland and right kidney is unremarkable.  1.8 cm right adrenal nodule is similar to prior examinations, and although technically not characterized on today's examination is favored to represent an adrenal adenoma.  Small low attenuation lesions in the left kidney are too small to definitively characterize, but are similar to prior examinations, and are favored to represent small cysts.  Atherosclerosis of the abdominal and pelvic vasculature, without definite aneurysm or dissection.  There are a few scattered colonic diverticula, without surrounding inflammatory changes to suggest acute diverticulitis.  No ascites or pneumoperitoneum and no pathologic distension of small bowel.  No definite pathologic lymphadenopathy identified within the abdomen or pelvis. Tiny umbilical hernia containing only a small amount of omental fat. Coarse calcifications of the prostate gland.  Urinary bladder is unremarkable in appearance.  Musculoskeletal: There are no aggressive appearing lytic or blastic lesions noted in the visualized portions of the skeleton. Postoperative changes of PLIF at L4-L5 are noted.  Old healed fracture of the anterolateral aspect of the left fifth rib.  IMPRESSION: 1.  No acute findings in the abdomen or pelvis to account for the patient's symptoms. 2.  Appearance of the lung bases bilaterally could suggest sequelae of mild aspiration with some aspiration pneumonitis in the lower lobes. 3.  Similar appearance of a right adrenal nodule and small low attenuation lesions in the left  kidney.  These are not technically characterized on today's examination.  These could be more definitively characterize with abdominal MRI with and without Gadolinium if clinically indicated.  4.  Mild atherosclerosis. 5. Mild colonic diverticulosis without findings to suggest acute diverticulitis at this time. 6.  Tiny umbilical hernia containing only  a small amount of omental fat. 7.  Postoperative changes, as above.   Original Report Authenticated By: Florencia Reasons, M.D.    Dg Abd Acute W/chest  12/30/2011  *RADIOLOGY REPORT*  Clinical Data: Nausea, vomiting, abdominal pain, past history of gastric ulcers and cholecystectomy  ACUTE ABDOMEN SERIES (ABDOMEN 2 VIEW & CHEST 1 VIEW)  Comparison: Chest radiograph 12/25/2011, abdominal radiographs of 11/01/2011  Findings: Normal heart size, mediastinal contours, and pulmonary vascularity. Chronic perihilar and basilar lung changes without acute infiltrate or effusion. Nonobstructive bowel gas pattern. No bowel dilatation, bowel wall thickening or free intraperitoneal air. Prior L4-L5 lumbar fusion. Surgical clips right upper quadrant question cholecystectomy. No definite urinary tract calcification.  IMPRESSION: No acute abnormalities.   Original Report Authenticated By: Lollie Marrow, M.D.     Pt has been advised of the symptoms that warrant their return to the ED. Patient has voiced understanding and has agreed to follow-up with the PCP or specialist.   Dorthula Matas, PA 01/23/12 1740

## 2012-01-24 NOTE — ED Provider Notes (Signed)
Medical screening examination/treatment/procedure(s) were performed by non-physician practitioner and as supervising physician I was immediately available for consultation/collaboration.  Tobin Chad, MD 01/24/12 [phone]

## 2012-01-24 NOTE — ED Provider Notes (Signed)
Medical screening examination/treatment/procedure(s) were performed by non-physician practitioner and as supervising physician I was immediately available for consultation/collaboration.   Andrian Sabala, MD 01/24/12 2307 

## 2012-01-29 ENCOUNTER — Emergency Department (HOSPITAL_COMMUNITY): Payer: Medicare Other

## 2012-01-29 ENCOUNTER — Encounter (HOSPITAL_COMMUNITY): Payer: Self-pay | Admitting: *Deleted

## 2012-01-29 ENCOUNTER — Emergency Department (HOSPITAL_COMMUNITY)
Admission: EM | Admit: 2012-01-29 | Discharge: 2012-01-29 | Disposition: A | Discharge status: Home / Self Care | Payer: Medicare Other | Attending: Emergency Medicine | Admitting: Emergency Medicine

## 2012-01-29 DIAGNOSIS — I10 Essential (primary) hypertension: Secondary | ICD-10-CM | POA: Insufficient documentation

## 2012-01-29 DIAGNOSIS — Z86718 Personal history of other venous thrombosis and embolism: Secondary | ICD-10-CM | POA: Insufficient documentation

## 2012-01-29 DIAGNOSIS — E669 Obesity, unspecified: Secondary | ICD-10-CM | POA: Insufficient documentation

## 2012-01-29 DIAGNOSIS — Z9089 Acquired absence of other organs: Secondary | ICD-10-CM | POA: Insufficient documentation

## 2012-01-29 DIAGNOSIS — R109 Unspecified abdominal pain: Secondary | ICD-10-CM | POA: Insufficient documentation

## 2012-01-29 DIAGNOSIS — Z7982 Long term (current) use of aspirin: Secondary | ICD-10-CM | POA: Insufficient documentation

## 2012-01-29 DIAGNOSIS — I2510 Atherosclerotic heart disease of native coronary artery without angina pectoris: Secondary | ICD-10-CM | POA: Insufficient documentation

## 2012-01-29 DIAGNOSIS — E119 Type 2 diabetes mellitus without complications: Secondary | ICD-10-CM | POA: Insufficient documentation

## 2012-01-29 DIAGNOSIS — Z79899 Other long term (current) drug therapy: Secondary | ICD-10-CM | POA: Insufficient documentation

## 2012-01-29 DIAGNOSIS — H6690 Otitis media, unspecified, unspecified ear: Secondary | ICD-10-CM | POA: Insufficient documentation

## 2012-01-29 LAB — COMPREHENSIVE METABOLIC PANEL
ALT: 6 U/L (ref 0–53)
Albumin: 3.7 g/dL (ref 3.5–5.2)
Alkaline Phosphatase: 73 U/L (ref 39–117)
BUN: 7 mg/dL (ref 6–23)
Calcium: 9.7 mg/dL (ref 8.4–10.5)
GFR calc Af Amer: >90 mL/min (ref >90)
Glucose, Bld: 297 mg/dL — ABNORMAL HIGH (ref 70–99)
Potassium: 3.9 mEq/L (ref 3.5–5.1)
Sodium: 136 mEq/L (ref 135–145)
Total Protein: 7.5 g/dL (ref 6.0–8.3)

## 2012-01-29 LAB — CBC WITH DIFFERENTIAL/PLATELET
Basophils Relative: 1 % (ref 0–1)
Eosinophils Absolute: 0.1 10*3/uL (ref 0.0–0.7)
Eosinophils Relative: 1 % (ref 0–5)
MCH: 32.5 pg (ref 26.0–34.0)
MCHC: 34.5 g/dL (ref 30.0–36.0)
MCV: 94.1 fL (ref 78.0–100.0)
Neutrophils Relative %: 63 % (ref 43–77)
Platelets: 188 10*3/uL (ref 150–400)

## 2012-01-29 LAB — GLUCOSE, CAPILLARY
Comment 3: 4591879
Glucose-Capillary: 277 mg/dL — ABNORMAL HIGH (ref 70–99)

## 2012-01-29 LAB — LIPASE, BLOOD: Lipase: 40 U/L (ref 11–59)

## 2012-01-29 MED ORDER — HYDROMORPHONE HCL PF 2 MG/ML IJ SOLN
2.0000 mg | Freq: Once | INTRAMUSCULAR | Status: AC
  Administered 2012-01-29: 2 mg via INTRAVENOUS
  Filled 2012-01-29: qty 1

## 2012-01-29 MED ORDER — ONDANSETRON HCL 4 MG/2ML IJ SOLN
4.0000 mg | Freq: Once | INTRAMUSCULAR | Status: AC
  Administered 2012-01-29: 4 mg via INTRAVENOUS
  Filled 2012-01-29: qty 2

## 2012-01-29 NOTE — ED Notes (Signed)
Attempted to gain IV access, no success. IV team paged.  

## 2012-01-29 NOTE — ED Notes (Signed)
Pt c/o left lower abd pain, reports he was recently here for same problem and took at the medications prescribed to him but still isn't feeling better.

## 2012-01-29 NOTE — ED Notes (Signed)
Pa has requested pt not get pain med until he goes for his imaging. He had initally refused to go to radiology without getting his meds

## 2012-01-29 NOTE — ED Provider Notes (Signed)
History     CSN: [id number]  Arrival date & time 01/29/12  1306   First MD Initiated Contact with Patient 01/29/12 1440      Chief Complaint  Patient presents with  . Abdominal Pain  . Emesis    HPI The patient presents to the emergent complaints of recurrent epigastric abdominal pain. Patient states he has chronic history of recurrent abdominal pain episodes. He has a history of pancreatitis and small bowel obstruction. In fact, he has been to the emergency room on August 23 28th 31st and September 26. Patient states he was given medications the last time he was here but the symptoms have persisted and not improved. Patient states the pain is in the epigastrium. It increases whenever he tries to eat or drink. He hasn't been able to keep much down because of this. He denies any diarrhea, chest pain or shortness of breath. He has not had any trouble with fevers. Patient has not been able to see his primary doctor recently for this problem. Past Medical History  Diagnosis Date  . Hypertension   . Diabetes mellitus   . DVT (deep venous thrombosis)   . Chronic back pain   . 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 Hyperlipidemia   . CAD (coronary atherosclerotic disease)     s/p stents x2  . Small bowel obstruction   . Pancreatitis   . Anxiety   . GERD (gastroesophageal reflux disease)     Past Surgical History  Procedure Date  . Coronary angioplasty with stent placement   . Tonsillectomy and adenoidectomy   . Cholecystectomy   . Back surgery   . Esophagogastroduodenoscopy 09/22/2011    Procedure: ESOPHAGOGASTRODUODENOSCOPY (EGD);  Surgeon: Charna Elizabeth, MD;  Location: [facility] ENDOSCOPY;  Service: Endoscopy;  Laterality: N/A;  . Tonsillectomy   . Esophagogastroduodenoscopy 01/05/2012    Procedure: ESOPHAGOGASTRODUODENOSCOPY (EGD);  Surgeon: Rachael Fee, MD;  Location: [facility] ENDOSCOPY;  Service: Endoscopy;  Laterality: N/A;    Family History  Problem Relation  Age of Onset  . Diabetes Father   . Colon cancer Neg Hx     History  Substance Use Topics  . Smoking status: Current Some Day Smoker -- 0.2 packs/day for 30 years    Types: Cigarettes  . Smokeless tobacco: Never Used  . Alcohol Use: Yes     occ      Review of Systems  Allergies  Lisinopril and Morphine and related  Home Medications   Current Outpatient Rx  Name Route Sig Dispense Refill  . ALPRAZOLAM 1 MG PO TABS Oral Take 1 tablet (1 mg total) by mouth 2 (two) times daily. 4 tablet 0  . ASPIRIN EC 81 MG PO TBEC Oral Take 81 mg by mouth daily.    . DULOXETINE HCL 30 MG PO CPEP Oral Take 1 capsule (30 mg total) by mouth daily.    Marland Kitchen GABAPENTIN 400 MG PO CAPS Oral Take 400 mg by mouth 3 (three) times daily.    Marland Kitchen GLIPIZIDE ER 10 MG PO TB24 Oral Take 10 mg by mouth 2 (two) times daily.    Marland Kitchen LACTULOSE 10 GM/15ML PO SOLN Oral Take 20 g by mouth 2 (two) times daily.    Marland Kitchen LOSARTAN POTASSIUM 100 MG PO TABS Oral Take 100 mg by mouth daily.    Marland Kitchen METFORMIN HCL 850 MG PO TABS Oral Take 850 mg by mouth 3 (three) times daily.    Marland Kitchen ONDANSETRON HCL 4  MG PO TABS Oral Take 1 tablet (4 mg total) by mouth every 6 (six) hours. 12 tablet 0  . OXYCODONE HCL 30 MG PO TABS Oral Take 1 tablet (30 mg total) by mouth every 4 (four) hours as needed. For breakthrough pain 12 tablet 0  . OXYCODONE HCL ER 60 MG PO TB12 Oral Take 60 mg by mouth every 12 (twelve) hours.    . OXYCODONE-ACETAMINOPHEN 5-325 MG PO TABS Oral Take 1 tablet by mouth every 6 (six) hours as needed for pain. 15 tablet 0  . PANTOPRAZOLE SODIUM 40 MG PO TBEC Oral Take 1 tablet (40 mg total) by mouth daily. 30 tablet 0  . PRESCRIPTION MEDICATION Subcutaneous Inject 12 Units into the skin 2 (two) times daily. insulin    . PROMETHAZINE HCL 25 MG RE SUPP Rectal Place 1 suppository (25 mg total) rectally every 6 (six) hours as needed for nausea. 12 each 0  . ZANTAC PO Oral Take 300 mg by mouth daily.     . SENNA-DOCUSATE SODIUM 8.6-50 MG PO  TABS Oral Take 1 tablet by mouth 2 (two) times daily as needed. For constipation      BP 144/80  Pulse 94  Temp 99.2 F (37.3 C) (Oral)  Resp 18  SpO2 97%  Physical Exam  Nursing note and vitals reviewed. Constitutional:       Obese   HENT:  Head: Normocephalic and atraumatic.  Right Ear: External ear normal.  Left Ear: External ear normal.  Eyes: Conjunctivae normal are normal. Right eye exhibits no discharge. Left eye exhibits no discharge. No scleral icterus.  Neck: Neck supple. No tracheal deviation present.  Cardiovascular: Normal rate, regular rhythm and intact distal pulses.   Pulmonary/Chest: Effort normal and breath sounds normal. No stridor. No respiratory distress. He has no wheezes. He has no rales.  Abdominal: Soft. Bowel sounds are normal. He exhibits no distension and no mass. There is tenderness in the epigastric area. There is no rigidity, no rebound, no guarding and no CVA tenderness. No hernia.  Musculoskeletal: He exhibits no edema and no tenderness.  Neurological: He is alert. He has normal strength. No sensory deficit. Cranial nerve deficit:  no gross defecits noted. He exhibits normal muscle tone. He displays no seizure activity. Coordination normal.  Skin: Skin is warm and dry. No rash noted. He is not diaphoretic.  Psychiatric: He has a normal mood and affect.    ED Course  Procedures (including critical care time)  Labs Reviewed  COMPREHENSIVE METABOLIC PANEL - Abnormal; Notable for the following:    Glucose, Bld 297 (*)     All other components within normal limits  GLUCOSE, CAPILLARY - Abnormal; Notable for the following:    Glucose-Capillary 277 (*)     All other components within normal limits  CBC WITH DIFFERENTIAL  URINALYSIS, ROUTINE W REFLEX MICROSCOPIC  LIPASE, BLOOD   No results found.    MDM  Pt has history of chronic recurrent abdominal pain.  I have reviewed old records. Pt has had numerous imaging studies in the past.  During  his last admission there was some concern that his chronic opiate use could be a related factor.  Will check labs.    Pt withotu signs of dehydration.  Nl WBC.  Will check aas and lipase.  If normal, recc follow up with his PCP.        Celene Kras, MD 01/29/12 [phone]

## 2012-01-29 NOTE — ED Provider Notes (Signed)
Patient transferred to CDU from pod a sign out from Dr. Lynelle Doctor as follows: This is a 61 year old gentleman presenting with complaints of exacerbation of chronic epigastric pain and reduces his by mouth intake. Plan is to obtain acute abdominal series and evaluate blood work. If these show no acute finding, patient is to follow with primary care or GI for followup.  Patient adamantly requesting narcotic pain control medications, he refuses to go for x-ray until he receives pain medication. Abdominal exam is benign with mild tenderness to deep palpation of the epigastrium and no peritoneal signs. Patient's blood work is unremarkable, and acute abdominal series shows no abnormalities. I have advised patient that he must follow with his primary care and GI Dr. for definitive management. Patient has verbalized his understanding I have advised him that I will not give him narcotic pain medication at this time and asked if he needs any nausea medication. Patient states that he has nausea medication at home and this is not necessary.   Pt verbalized understanding and agrees with care plan. Outpatient follow-up and return precautions given.     Wynetta Emery, PA-C 01/29/12 1826

## 2012-01-29 NOTE — ED Notes (Signed)
PT is here because he has lower abdominal pain and cannot keep anything down.

## 2012-01-29 NOTE — ED Notes (Signed)
Pt refused to go to radiology until he gets pain medicine.

## 2012-01-30 NOTE — ED Provider Notes (Signed)
Medical screening examination/treatment/procedure(s) were conducted as a shared visit with non-physician practitioner(s) and myself.  I personally evaluated the patient during the encounter   Celene Kras, MD 01/30/12 [phone]

## 2012-02-05 ENCOUNTER — Emergency Department (HOSPITAL_COMMUNITY)
Admission: EM | Admit: 2012-02-05 | Discharge: 2012-02-05 | Disposition: A | Discharge status: Home / Self Care | Payer: Medicare Other | Attending: Emergency Medicine | Admitting: Emergency Medicine

## 2012-02-05 ENCOUNTER — Encounter (HOSPITAL_COMMUNITY): Payer: Self-pay | Admitting: Emergency Medicine

## 2012-02-05 ENCOUNTER — Emergency Department (HOSPITAL_COMMUNITY): Payer: Medicare Other

## 2012-02-05 DIAGNOSIS — I2510 Atherosclerotic heart disease of native coronary artery without angina pectoris: Secondary | ICD-10-CM | POA: Insufficient documentation

## 2012-02-05 DIAGNOSIS — Z86718 Personal history of other venous thrombosis and embolism: Secondary | ICD-10-CM | POA: Insufficient documentation

## 2012-02-05 DIAGNOSIS — R739 Hyperglycemia, unspecified: Secondary | ICD-10-CM

## 2012-02-05 DIAGNOSIS — K3184 Gastroparesis: Secondary | ICD-10-CM | POA: Insufficient documentation

## 2012-02-05 DIAGNOSIS — I10 Essential (primary) hypertension: Secondary | ICD-10-CM | POA: Insufficient documentation

## 2012-02-05 DIAGNOSIS — Z7982 Long term (current) use of aspirin: Secondary | ICD-10-CM | POA: Insufficient documentation

## 2012-02-05 DIAGNOSIS — E1169 Type 2 diabetes mellitus with other specified complication: Secondary | ICD-10-CM | POA: Insufficient documentation

## 2012-02-05 DIAGNOSIS — Z79899 Other long term (current) drug therapy: Secondary | ICD-10-CM | POA: Insufficient documentation

## 2012-02-05 LAB — URINE MICROSCOPIC-ADD ON

## 2012-02-05 LAB — COMPREHENSIVE METABOLIC PANEL
ALT: 6 U/L (ref 0–53)
AST: 8 U/L (ref 0–37)
Albumin: 3.7 g/dL (ref 3.5–5.2)
Alkaline Phosphatase: 70 U/L (ref 39–117)
Chloride: 102 mEq/L (ref 96–112)
Potassium: 3.4 mEq/L — ABNORMAL LOW (ref 3.5–5.1)
Sodium: 137 mEq/L (ref 135–145)
Total Bilirubin: 0.3 mg/dL (ref 0.3–1.2)
Total Protein: 6.9 g/dL (ref 6.0–8.3)

## 2012-02-05 LAB — CBC WITH DIFFERENTIAL/PLATELET
Basophils Absolute: 0 10*3/uL (ref 0.0–0.1)
Basophils Relative: 1 % (ref 0–1)
Eosinophils Absolute: 0 10*3/uL (ref 0.0–0.7)
Hemoglobin: 14.4 g/dL (ref 13.0–17.0)
MCH: 31.2 pg (ref 26.0–34.0)
MCHC: 33.5 g/dL (ref 30.0–36.0)
Neutro Abs: 4.1 10*3/uL (ref 1.7–7.7)
Neutrophils Relative %: 65 % (ref 43–77)
Platelets: 183 10*3/uL (ref 150–400)

## 2012-02-05 LAB — URINALYSIS, ROUTINE W REFLEX MICROSCOPIC
Bilirubin Urine: NEGATIVE
Glucose, UA: 250 mg/dL — AB
Nitrite: NEGATIVE
Specific Gravity, Urine: 1.024 (ref 1.005–1.030)
pH: 6.5 (ref 5.0–8.0)

## 2012-02-05 LAB — RAPID URINE DRUG SCREEN, HOSP PERFORMED
Amphetamines: NOT DETECTED
Barbiturates: NOT DETECTED
Benzodiazepines: NOT DETECTED
Cocaine: NOT DETECTED

## 2012-02-05 LAB — LIPASE, BLOOD: Lipase: 24 U/L (ref 11–59)

## 2012-02-05 MED ORDER — METOCLOPRAMIDE HCL 5 MG/ML IJ SOLN
10.0000 mg | Freq: Once | INTRAMUSCULAR | Status: AC
  Administered 2012-02-05: 10 mg via INTRAVENOUS
  Filled 2012-02-05: qty 2

## 2012-02-05 MED ORDER — METOCLOPRAMIDE HCL 10 MG PO TABS: 10.0000 mg | ORAL_TABLET | Freq: Four times a day (QID) | ORAL | Status: DC

## 2012-02-05 MED ORDER — HYDROMORPHONE HCL PF 1 MG/ML IJ SOLN
1.0000 mg | Freq: Once | INTRAMUSCULAR | Status: AC
  Administered 2012-02-05: 1 mg via INTRAVENOUS
  Filled 2012-02-05: qty 1

## 2012-02-05 MED ORDER — SODIUM CHLORIDE 0.9 % IV BOLUS (SEPSIS)
500.0000 mL | Freq: Once | INTRAVENOUS | Status: AC
  Administered 2012-02-05: 500 mL via INTRAVENOUS

## 2012-02-05 MED ORDER — SODIUM CHLORIDE 0.9 % IV SOLN
INTRAVENOUS | Status: DC
  Administered 2012-02-05: 14:00:00 via INTRAVENOUS

## 2012-02-05 NOTE — ED Notes (Signed)
Pt returned from Xray and placed back on monitor.

## 2012-02-05 NOTE — ED Notes (Signed)
Pt reports severe mid abdominal pain for over 1 week.  Pt states he has not been able to keep anything down and has occassional diarrhea.  Pt states pain is 10/10 and constant.  History of pancreatitis.  Pt alert oriented X4

## 2012-02-05 NOTE — ED Notes (Addendum)
EMT at bedside

## 2012-02-05 NOTE — ED Notes (Signed)
Patient states "here 3 days ago in CDU for same".  No medications given at discharge.

## 2012-02-05 NOTE — ED Notes (Signed)
Informed Dr. Effie Shy that the pt was requesting to see him. Then informed pt that I had spoken with Dr Effie Shy.

## 2012-02-05 NOTE — ED Notes (Signed)
Dr. Wentz at bedside. 

## 2012-02-05 NOTE — ED Notes (Signed)
Pt does not appear to be in any acute distress leaving the ED. Pt states friend is coming to pick him up from ED. Pt verbalized understanding of discharge instructions and medications. Pt has no further questions.

## 2012-02-05 NOTE — ED Provider Notes (Signed)
History     CSN: [id number]  Arrival date & time 02/05/12  4098   First MD Initiated Contact with Patient 02/05/12 1014      Chief Complaint  Patient presents with  . Emesis    (Consider location/radiation/quality/duration/timing/severity/associated sxs/prior treatment) HPI Comments: Ryan [name] is a 61 y.o. Male with vomiting for 5 days and upper abdominal pain. Recently seen here for same. No fever. States cannot tolerate his chronic narcotic analgesia. Vomiting aggravated by oral intake. No fever/chills/diarrhea.   Patient is a 61 y.o. male presenting with vomiting. The history is provided by the patient.  Emesis     Past Medical History  Diagnosis Date  . Hypertension   . Diabetes mellitus   . DVT (deep venous thrombosis)   . Chronic back pain   . 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 Hyperlipidemia   . CAD (coronary atherosclerotic disease)     s/p stents x2  . Small bowel obstruction   . Pancreatitis   . Anxiety   . GERD (gastroesophageal reflux disease)     Past Surgical History  Procedure Date  . Coronary angioplasty with stent placement   . Tonsillectomy and adenoidectomy   . Cholecystectomy   . Back surgery   . Esophagogastroduodenoscopy 09/22/2011    Procedure: ESOPHAGOGASTRODUODENOSCOPY (EGD);  Surgeon: Charna Elizabeth, MD;  Location: [facility] ENDOSCOPY;  Service: Endoscopy;  Laterality: N/A;  . Tonsillectomy   . Esophagogastroduodenoscopy 01/05/2012    Procedure: ESOPHAGOGASTRODUODENOSCOPY (EGD);  Surgeon: Rachael Fee, MD;  Location: [facility] ENDOSCOPY;  Service: Endoscopy;  Laterality: N/A;    Family History  Problem Relation Age of Onset  . Diabetes Father   . Colon cancer Neg Hx     History  Substance Use Topics  . Smoking status: Current Some Day Smoker -- 0.2 packs/day for 30 years    Types: Cigarettes  . Smokeless tobacco: Never Used  . Alcohol Use: Yes     occ      Review of Systems  Gastrointestinal: Positive for  vomiting.  All other systems reviewed and are negative.    Allergies  Lisinopril and Morphine and related  Home Medications   Current Outpatient Rx  Name Route Sig Dispense Refill  . ALPRAZOLAM 1 MG PO TABS Oral Take 1 tablet (1 mg total) by mouth 2 (two) times daily. 4 tablet 0  . ASPIRIN EC 81 MG PO TBEC Oral Take 81 mg by mouth daily.    . DULOXETINE HCL 30 MG PO CPEP Oral Take 1 capsule (30 mg total) by mouth daily.    Marland Kitchen GABAPENTIN 400 MG PO CAPS Oral Take 400 mg by mouth 3 (three) times daily.    Marland Kitchen GLIPIZIDE ER 10 MG PO TB24 Oral Take 10 mg by mouth 2 (two) times daily.    Marland Kitchen LACTULOSE 10 GM/15ML PO SOLN Oral Take 20 g by mouth 2 (two) times daily.    Marland Kitchen LOSARTAN POTASSIUM 100 MG PO TABS Oral Take 100 mg by mouth daily.    Marland Kitchen METFORMIN HCL 850 MG PO TABS Oral Take 850 mg by mouth 3 (three) times daily.    Marland Kitchen ONDANSETRON HCL 4 MG PO TABS Oral Take 1 tablet (4 mg total) by mouth every 6 (six) hours. 12 tablet 0  . OXYCODONE HCL 30 MG PO TABS Oral Take 1 tablet (30 mg total) by mouth every 4 (four) hours as needed. For breakthrough pain 12 tablet 0  . OXYCODONE HCL  ER 60 MG PO TB12 Oral Take 60 mg by mouth every 12 (twelve) hours.    . OXYCODONE-ACETAMINOPHEN 5-325 MG PO TABS Oral Take 1 tablet by mouth every 6 (six) hours as needed for pain. 15 tablet 0  . PANTOPRAZOLE SODIUM 40 MG PO TBEC Oral Take 1 tablet (40 mg total) by mouth daily. 30 tablet 0  . PRESCRIPTION MEDICATION Subcutaneous Inject 12 Units into the skin 2 (two) times daily. insulin    . ZANTAC PO Oral Take 300 mg by mouth daily.     . SENNA-DOCUSATE SODIUM 8.6-50 MG PO TABS Oral Take 1 tablet by mouth 2 (two) times daily as needed. For constipation    . METOCLOPRAMIDE HCL 10 MG PO TABS Oral Take 1 tablet (10 mg total) by mouth every 6 (six) hours. 30 tablet 0    BP 145/69  Pulse 74  Temp 98.7 F (37.1 C) (Oral)  Resp 16  SpO2 98%  Physical Exam  Nursing note and vitals reviewed. Constitutional: He is oriented to  person, place, and time. He appears well-developed and well-nourished. He appears distressed (moderate).  HENT:  Head: Normocephalic and atraumatic.  Right Ear: External ear normal.  Left Ear: External ear normal.  Eyes: Conjunctivae normal and EOM are normal. Pupils are equal, round, and reactive to light.  Neck: Normal range of motion and phonation normal. Neck supple.  Cardiovascular: Normal rate, regular rhythm, normal heart sounds and intact distal pulses.   Pulmonary/Chest: Effort normal and breath sounds normal. He exhibits no bony tenderness.  Abdominal: Soft. Normal appearance. There is no tenderness (upper, bilateral, mild).  Musculoskeletal: Normal range of motion.  Neurological: He is alert and oriented to person, place, and time. He has normal strength. No cranial nerve deficit or sensory deficit. He exhibits normal muscle tone. Coordination normal.  Skin: Skin is warm, dry and intact.  Psychiatric: His behavior is normal. Judgment and thought content normal.       anxious    ED Course  Procedures (including critical care time)  Labs Reviewed  COMPREHENSIVE METABOLIC PANEL - Abnormal; Notable for the following:    Potassium 3.4 (*)     Glucose, Bld 238 (*)     All other components within normal limits  URINALYSIS, ROUTINE W REFLEX MICROSCOPIC - Abnormal; Notable for the following:    Glucose, UA 250 (*)     Leukocytes, UA SMALL (*)     All other components within normal limits  URINE MICROSCOPIC-ADD ON - Abnormal; Notable for the following:    Crystals CA OXALATE CRYSTALS (*)     All other components within normal limits  CBC WITH DIFFERENTIAL  LIPASE, BLOOD  URINE RAPID DRUG SCREEN (HOSP PERFORMED)  ETHANOL   Dg Abd Acute W/chest  02/05/2012  *RADIOLOGY REPORT*  Clinical Data: Vomiting, pain and diarrhea.  ACUTE ABDOMEN SERIES (ABDOMEN 2 VIEW & CHEST 1 VIEW)  Comparison: 01/29/2012.  Findings: Frontal view of the chest shows midline trachea and normal heart size.   Mild perihilar air space opacities, unchanged. No pleural fluid.  Two views of the abdomen show gas and stool scattered in minimally prominent colon.  There may be a few associated air fluid levels. There may be a single loop of mildly dilated small bowel in the left abdomen.  Postop changes in the spine.  IMPRESSION:  1.  Bowel gas pattern is nonspecific.  No evidence of overt obstruction. 2.  Perihilar air space opacities may be chronic.   Original Report Authenticated  By: Reyes Ivan, M.D.      1. Gastroparesis   2. Hyperglycemia       MDM  Nonspecific c/o with normal ED findings. Old charts reviewed. Doubt obstruction, colitis. Doubt metabolic instability, serious bacterial infection or impending vascular collapse; the patient is stable for discharge.   Plan: Home Medications- Reglan; Home Treatments- gradually advance diet; Recommended follow up- PCP prn and 1 week for check up       Flint Melter, MD 02/05/12 2105

## 2012-02-05 NOTE — ED Notes (Signed)
Pt transported to Xray. 

## 2012-03-01 ENCOUNTER — Emergency Department (HOSPITAL_COMMUNITY): Payer: Medicare Other

## 2012-03-01 ENCOUNTER — Encounter (HOSPITAL_COMMUNITY): Payer: Self-pay

## 2012-03-01 ENCOUNTER — Emergency Department (HOSPITAL_COMMUNITY)
Admission: EM | Admit: 2012-03-01 | Discharge: 2012-03-01 | Disposition: A | Discharge status: Home / Self Care | Payer: Medicare Other | Attending: Emergency Medicine | Admitting: Emergency Medicine

## 2012-03-01 DIAGNOSIS — G8929 Other chronic pain: Secondary | ICD-10-CM | POA: Insufficient documentation

## 2012-03-01 DIAGNOSIS — R112 Nausea with vomiting, unspecified: Secondary | ICD-10-CM | POA: Insufficient documentation

## 2012-03-01 DIAGNOSIS — E785 Hyperlipidemia, unspecified: Secondary | ICD-10-CM | POA: Insufficient documentation

## 2012-03-01 DIAGNOSIS — Z9861 Coronary angioplasty status: Secondary | ICD-10-CM | POA: Insufficient documentation

## 2012-03-01 DIAGNOSIS — R509 Fever, unspecified: Secondary | ICD-10-CM | POA: Insufficient documentation

## 2012-03-01 DIAGNOSIS — I82409 Acute embolism and thrombosis of unspecified deep veins of unspecified lower extremity: Secondary | ICD-10-CM | POA: Insufficient documentation

## 2012-03-01 DIAGNOSIS — Z8611 Personal history of tuberculosis: Secondary | ICD-10-CM | POA: Insufficient documentation

## 2012-03-01 DIAGNOSIS — E119 Type 2 diabetes mellitus without complications: Secondary | ICD-10-CM | POA: Insufficient documentation

## 2012-03-01 DIAGNOSIS — Z7982 Long term (current) use of aspirin: Secondary | ICD-10-CM | POA: Insufficient documentation

## 2012-03-01 DIAGNOSIS — I10 Essential (primary) hypertension: Secondary | ICD-10-CM | POA: Insufficient documentation

## 2012-03-01 DIAGNOSIS — Z794 Long term (current) use of insulin: Secondary | ICD-10-CM | POA: Insufficient documentation

## 2012-03-01 DIAGNOSIS — F411 Generalized anxiety disorder: Secondary | ICD-10-CM | POA: Insufficient documentation

## 2012-03-01 DIAGNOSIS — K219 Gastro-esophageal reflux disease without esophagitis: Secondary | ICD-10-CM | POA: Insufficient documentation

## 2012-03-01 DIAGNOSIS — F17200 Nicotine dependence, unspecified, uncomplicated: Secondary | ICD-10-CM | POA: Insufficient documentation

## 2012-03-01 DIAGNOSIS — Z8719 Personal history of other diseases of the digestive system: Secondary | ICD-10-CM | POA: Insufficient documentation

## 2012-03-01 DIAGNOSIS — M549 Dorsalgia, unspecified: Secondary | ICD-10-CM | POA: Insufficient documentation

## 2012-03-01 DIAGNOSIS — Z79899 Other long term (current) drug therapy: Secondary | ICD-10-CM | POA: Insufficient documentation

## 2012-03-01 DIAGNOSIS — R109 Unspecified abdominal pain: Secondary | ICD-10-CM | POA: Insufficient documentation

## 2012-03-01 DIAGNOSIS — I2510 Atherosclerotic heart disease of native coronary artery without angina pectoris: Secondary | ICD-10-CM | POA: Insufficient documentation

## 2012-03-01 LAB — CBC WITH DIFFERENTIAL/PLATELET
Basophils Relative: 0 % (ref 0–1)
Hemoglobin: 13 g/dL (ref 13.0–17.0)
Lymphs Abs: 1.9 10*3/uL (ref 0.7–4.0)
MCHC: 32.6 g/dL (ref 30.0–36.0)
MCV: 95.2 fL (ref 78.0–100.0)
Monocytes Relative: 9 % (ref 3–12)
Neutro Abs: 6.1 10*3/uL (ref 1.7–7.7)
Neutrophils Relative %: 68 % (ref 43–77)
Platelets: 210 10*3/uL (ref 150–400)
RBC: 4.2 MIL/uL — ABNORMAL LOW (ref 4.22–5.81)
RDW: 13.9 % (ref 11.5–15.5)
WBC: 9.9 10*3/uL (ref 4.0–10.5)

## 2012-03-01 LAB — URINALYSIS, ROUTINE W REFLEX MICROSCOPIC
Nitrite: NEGATIVE
Specific Gravity, Urine: 1.022 (ref 1.005–1.030)
pH: 6.5 (ref 5.0–8.0)

## 2012-03-01 LAB — COMPREHENSIVE METABOLIC PANEL
Albumin: 3.3 g/dL — ABNORMAL LOW (ref 3.5–5.2)
BUN: 10 mg/dL (ref 6–23)
Calcium: 9.2 mg/dL (ref 8.4–10.5)
Creatinine, Ser: 0.67 mg/dL (ref 0.50–1.35)
Potassium: 3.8 mEq/L (ref 3.5–5.1)
Total Protein: 7 g/dL (ref 6.0–8.3)

## 2012-03-01 LAB — URINE MICROSCOPIC-ADD ON

## 2012-03-01 LAB — LIPASE, BLOOD: Lipase: 22 U/L (ref 11–59)

## 2012-03-01 MED ORDER — SODIUM CHLORIDE 0.9 % IV BOLUS (SEPSIS)
1000.0000 mL | Freq: Once | INTRAVENOUS | Status: AC
  Administered 2012-03-01: 1000 mL via INTRAVENOUS

## 2012-03-01 MED ORDER — HYDROMORPHONE HCL PF 1 MG/ML IJ SOLN
1.0000 mg | Freq: Once | INTRAMUSCULAR | Status: AC
  Administered 2012-03-01: 1 mg via INTRAVENOUS
  Filled 2012-03-01: qty 1

## 2012-03-01 MED ORDER — IOHEXOL 300 MG/ML  SOLN
125.0000 mL | Freq: Once | INTRAMUSCULAR | Status: AC | PRN
  Administered 2012-03-01: 125 mL via INTRAVENOUS

## 2012-03-01 MED ORDER — IOHEXOL 300 MG/ML  SOLN: 20.0000 mL | INTRAMUSCULAR | Status: AC

## 2012-03-01 MED ORDER — ONDANSETRON HCL 4 MG/2ML IJ SOLN
4.0000 mg | Freq: Once | INTRAMUSCULAR | Status: AC
  Administered 2012-03-01: 4 mg via INTRAVENOUS
  Filled 2012-03-01: qty 2

## 2012-03-01 MED ORDER — HYDROMORPHONE HCL PF 2 MG/ML IJ SOLN
2.0000 mg | Freq: Once | INTRAMUSCULAR | Status: AC
  Administered 2012-03-01: 2 mg via INTRAVENOUS
  Filled 2012-03-01: qty 1

## 2012-03-01 NOTE — ED Notes (Signed)
Pt still unable to give a urine sample but does know we need a sample. Will try again in a few minutes

## 2012-03-01 NOTE — ED Notes (Signed)
Pt knows that urine is needed 

## 2012-03-01 NOTE — ED Provider Notes (Signed)
History     CSN: [id number]  Arrival date & time 03/01/12  5621   First MD Initiated Contact with Patient 03/01/12 0919      Chief Complaint  Patient presents with  . Abdominal Pain  . Emesis  . Chills    (Consider location/radiation/quality/duration/timing/severity/associated sxs/prior treatment) HPI Comments: Patient presents today with a chief complaint of abdominal pain.  Pain is located primarily in the LUQ and the LLQ.  He does have a history of chronic abdominal pain, but reports that the pain today is different.  Pain present for the past 4-5 days and is gradually worsening.  He states that last evening he had a fever.  Oral temperature of 102.  He also reports that he has had several episodes of vomiting over the past 4-5 days.  He has taken Oxycodone 30mg  for the pain and has taken Phenergan for nausea, but does not feel that it is helping.  He has numerous ED visits for abdominal pain.  Patient is a 61 y.o. male presenting with abdominal pain and vomiting. The history is provided by the patient.  Abdominal Pain The primary symptoms of the illness include abdominal pain, fever, nausea and vomiting. The primary symptoms of the illness do not include shortness of breath, diarrhea or dysuria.  Additional symptoms associated with the illness include chills. Symptoms associated with the illness do not include constipation, urgency, hematuria or frequency.  Emesis  Associated symptoms include abdominal pain, chills and a fever. Pertinent negatives include no diarrhea.    Past Medical History  Diagnosis Date  . Hypertension   . Diabetes mellitus   . DVT (deep venous thrombosis)   . Chronic back pain   . 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 Hyperlipidemia   . CAD (coronary atherosclerotic disease)     s/p stents x2  . Small bowel obstruction   . Pancreatitis   . Anxiety   . GERD (gastroesophageal reflux disease)     Past Surgical History    Procedure Date  . Coronary angioplasty with stent placement   . Tonsillectomy and adenoidectomy   . Cholecystectomy   . Back surgery   . Esophagogastroduodenoscopy 09/22/2011    Procedure: ESOPHAGOGASTRODUODENOSCOPY (EGD);  Surgeon: Charna Elizabeth, MD;  Location: [facility] ENDOSCOPY;  Service: Endoscopy;  Laterality: N/A;  . Tonsillectomy   . Esophagogastroduodenoscopy 01/05/2012    Procedure: ESOPHAGOGASTRODUODENOSCOPY (EGD);  Surgeon: Rachael Fee, MD;  Location: [facility] ENDOSCOPY;  Service: Endoscopy;  Laterality: N/A;    Family History  Problem Relation Age of Onset  . Diabetes Father   . Colon cancer Neg Hx     History  Substance Use Topics  . Smoking status: Current Some Day Smoker -- 0.2 packs/day for 30 years    Types: Cigarettes  . Smokeless tobacco: Never Used  . Alcohol Use: Yes     Comment: occ      Review of Systems  Constitutional: Positive for fever and chills.  Respiratory: Negative for shortness of breath.   Cardiovascular: Negative for chest pain.  Gastrointestinal: Positive for nausea, vomiting and abdominal pain. Negative for diarrhea, constipation and blood in stool.  Genitourinary: Negative for dysuria, urgency, frequency, hematuria, scrotal swelling, difficulty urinating and testicular pain.    Allergies  Lisinopril and Morphine and related  Home Medications   Current Outpatient Rx  Name  Route  Sig  Dispense  Refill  . ALPRAZOLAM 1 MG PO TABS   Oral   Take  1 tablet (1 mg total) by mouth 2 (two) times daily.   4 tablet   0   . ASPIRIN EC 81 MG PO TBEC   Oral   Take 81 mg by mouth daily.         . DULOXETINE HCL 30 MG PO CPEP   Oral   Take 1 capsule (30 mg total) by mouth daily.         Marland Kitchen GABAPENTIN 400 MG PO CAPS   Oral   Take 400 mg by mouth 3 (three) times daily.         Marland Kitchen GLIPIZIDE ER 10 MG PO TB24   Oral   Take 10 mg by mouth 2 (two) times daily.         . INSULIN LISPRO PROT & LISPRO (75-25) 100 UNIT/ML Lupus SUSP   Subcutaneous    Inject 12 Units into the skin 2 (two) times daily with a meal.         . LACTULOSE 10 GM/15ML PO SOLN   Oral   Take 20 g by mouth 2 (two) times daily.         Marland Kitchen LOSARTAN POTASSIUM 100 MG PO TABS   Oral   Take 100 mg by mouth daily.         Marland Kitchen METFORMIN HCL 850 MG PO TABS   Oral   Take 850 mg by mouth 3 (three) times daily.         Marland Kitchen METOCLOPRAMIDE HCL 10 MG PO TABS   Oral   Take 10 mg by mouth every 6 (six) hours as needed. For nausea         . ONDANSETRON HCL 4 MG PO TABS   Oral   Take 4 mg by mouth every 6 (six) hours as needed. For nausea         . OXYCODONE HCL 30 MG PO TABS   Oral   Take 1 tablet (30 mg total) by mouth every 4 (four) hours as needed. For breakthrough pain   12 tablet   0   . OXYCODONE HCL ER 60 MG PO TB12   Oral   Take 60 mg by mouth every 12 (twelve) hours.         Marland Kitchen PANTOPRAZOLE SODIUM 40 MG PO TBEC   Oral   Take 1 tablet (40 mg total) by mouth daily.   30 tablet   0   . PROMETHAZINE HCL 25 MG PO TABS   Oral   Take 25 mg by mouth every 6 (six) hours as needed. For nausea         . ZANTAC PO   Oral   Take 300 mg by mouth 2 (two) times daily.          . SENNA-DOCUSATE SODIUM 8.6-50 MG PO TABS   Oral   Take 1 tablet by mouth 2 (two) times daily as needed. For constipation           BP 142/71  Pulse 94  Temp 98.5 F (36.9 C) (Oral)  Resp 20  Ht 6\' 5"  (1.956 m)  Wt 325 lb (147.419 kg)  BMI 38.54 kg/m2  SpO2 100%  Physical Exam  Nursing note and vitals reviewed. Constitutional: He appears well-developed and well-nourished. No distress.  HENT:  Head: Normocephalic and atraumatic.  Mouth/Throat: Oropharynx is clear and moist.  Neck: Normal range of motion. Neck supple.  Cardiovascular: Normal rate, regular rhythm and normal heart sounds.   Pulmonary/Chest: Effort normal and  breath sounds normal. No respiratory distress. He has no wheezes.  Abdominal: Soft. Bowel sounds are normal. He exhibits no distension and  no mass. There is tenderness in the left upper quadrant and left lower quadrant. There is guarding. There is no rigidity and no rebound.       Morbidly obese abdomen  Neurological: He is alert.  Skin: Skin is warm and dry. He is not diaphoretic.  Psychiatric: He has a normal mood and affect.    ED Course  Procedures (including critical care time)   Labs Reviewed  CBC WITH DIFFERENTIAL  COMPREHENSIVE METABOLIC PANEL  LIPASE, BLOOD  URINALYSIS, ROUTINE W REFLEX MICROSCOPIC   Ct Abdomen Pelvis W Contrast  03/01/2012  *RADIOLOGY REPORT*  Clinical Data: Left lower quadrant abdominal pain.  CT ABDOMEN AND PELVIS WITH CONTRAST  Technique:  Multidetector CT imaging of the abdomen and pelvis was performed following the standard protocol during bolus administration of intravenous contrast.  Contrast: OMNIPAQUE IOHEXOL 300 MG/ML  SOLN  Comparison: numerous prior CT scans dating back to 2009.  Findings: The lung bases demonstrate patchy areas of atelectasis and mild peribronchial thickening.  Patchy ground-glass opacities could reflect mild edema.  No focal infiltrates or effusion.  The heart is normal in size.  No pericardial effusion.  The liver is unremarkable and stable.  No worrisome hepatic lesions.  There is stable mild intrahepatic biliary dilatation likely due to prior cholecystectomy.  This is a stable finding. The common bile duct is stable in caliber.  The pancreatic duct is slightly prominent but also unchanged.  The pancreas is normal. The spleen is normal in size.  No focal lesions.  The adrenal glands and kidneys are stable.  There is a stable upper pole right renal cyst and a stable lower pole hyperdense/hemorrhagic cyst.  The stomach is not well distended with contrast but no gross abnormalities are seen.  The duodenum, small bowel and colon are unremarkable.  No inflammatory changes or mass lesions.  There are sigmoid diverticuli but no findings for acute diverticulitis.  The aorta is  normal in caliber.  The major branch vessels are patent.  There are stable borderline enlarged retroperitoneal lymph nodes.  The bony structures are intact.  Stable surgical changes noted in the lumbar spine.  Both  The bladder, prostate gland and seminal vesicles are unremarkable. Prostate calcifications are noted.  No pelvic mass, adenopathy or free pelvic fluid collections.  No inguinal mass or hernia. Scattered inguinal lymph nodes are noted.  IMPRESSION:  1.  Bibasilar atelectasis and probable edema. 2.  Stable mild biliary dilatation. 3.  Stable renal lesions. 4.  No acute abdominal/pelvic findings, mass lesions or new adenopathy.  Stable borderline retroperitoneal lymph nodes.   Original Report Authenticated By: Rudie Meyer, M.D.      No diagnosis found.    MDM  Patient comes in today complaining of abdominal pain.  Pain located LUQ and LLQ.  He has a history of chronic abdominal pain, but states that his pain today is different than his usual pain.  He reports that last evening he had a fever of 102.  Therefore, CT ab/pelvis ordered.  Labs today unremarkable.  No acute findings on Ab/Pelvis CT.  Pain and nausea improved prior to discharge.  Patient able to tolerate po liquids.  Therefore, feel that patient can be discharged home.  Return precautions discussed.  Patient has pain medication and antiemetics at home.        Kassidi Elza Anne Shutter, PA-C  03/02/12 1140 

## 2012-03-01 NOTE — ED Notes (Signed)
Pt is having abdominal pain with vomiting, no diarrhea, experiencing cold chills.  Pt state he had a fever of 102 last night.  Patient took tylenol at 2200 last night.

## 2012-03-02 LAB — URINE CULTURE

## 2012-03-02 NOTE — ED Provider Notes (Signed)
Medical screening examination/treatment/procedure(s) were performed by non-physician practitioner and as supervising physician I was immediately available for consultation/collaboration.   Richardean Canal, MD 03/02/12 [phone]

## 2012-03-04 ENCOUNTER — Emergency Department (HOSPITAL_COMMUNITY): Payer: Medicare Other

## 2012-03-04 ENCOUNTER — Observation Stay (HOSPITAL_COMMUNITY)
Admission: EM | Admit: 2012-03-04 | Discharge: 2012-03-06 | Disposition: A | Discharge status: Home / Self Care | Payer: Medicare Other | Attending: Internal Medicine | Admitting: Internal Medicine

## 2012-03-04 ENCOUNTER — Encounter (HOSPITAL_COMMUNITY): Payer: Self-pay | Admitting: Emergency Medicine

## 2012-03-04 DIAGNOSIS — Z86718 Personal history of other venous thrombosis and embolism: Secondary | ICD-10-CM

## 2012-03-04 DIAGNOSIS — K219 Gastro-esophageal reflux disease without esophagitis: Secondary | ICD-10-CM | POA: Insufficient documentation

## 2012-03-04 DIAGNOSIS — R5381 Other malaise: Secondary | ICD-10-CM | POA: Insufficient documentation

## 2012-03-04 DIAGNOSIS — M545 Low back pain: Secondary | ICD-10-CM

## 2012-03-04 DIAGNOSIS — E876 Hypokalemia: Secondary | ICD-10-CM | POA: Insufficient documentation

## 2012-03-04 DIAGNOSIS — G8929 Other chronic pain: Secondary | ICD-10-CM | POA: Insufficient documentation

## 2012-03-04 DIAGNOSIS — F17200 Nicotine dependence, unspecified, uncomplicated: Secondary | ICD-10-CM | POA: Insufficient documentation

## 2012-03-04 DIAGNOSIS — R933 Abnormal findings on diagnostic imaging of other parts of digestive tract: Secondary | ICD-10-CM

## 2012-03-04 DIAGNOSIS — Z8719 Personal history of other diseases of the digestive system: Secondary | ICD-10-CM

## 2012-03-04 DIAGNOSIS — Z79899 Other long term (current) drug therapy: Secondary | ICD-10-CM | POA: Insufficient documentation

## 2012-03-04 DIAGNOSIS — R112 Nausea with vomiting, unspecified: Secondary | ICD-10-CM

## 2012-03-04 DIAGNOSIS — E119 Type 2 diabetes mellitus without complications: Secondary | ICD-10-CM

## 2012-03-04 DIAGNOSIS — K5903 Drug induced constipation: Secondary | ICD-10-CM

## 2012-03-04 DIAGNOSIS — R6883 Chills (without fever): Secondary | ICD-10-CM | POA: Insufficient documentation

## 2012-03-04 DIAGNOSIS — I2510 Atherosclerotic heart disease of native coronary artery without angina pectoris: Secondary | ICD-10-CM | POA: Insufficient documentation

## 2012-03-04 DIAGNOSIS — K259 Gastric ulcer, unspecified as acute or chronic, without hemorrhage or perforation: Secondary | ICD-10-CM

## 2012-03-04 DIAGNOSIS — Z794 Long term (current) use of insulin: Secondary | ICD-10-CM | POA: Insufficient documentation

## 2012-03-04 DIAGNOSIS — K56609 Unspecified intestinal obstruction, unspecified as to partial versus complete obstruction: Secondary | ICD-10-CM

## 2012-03-04 DIAGNOSIS — I10 Essential (primary) hypertension: Secondary | ICD-10-CM | POA: Insufficient documentation

## 2012-03-04 DIAGNOSIS — R109 Unspecified abdominal pain: Secondary | ICD-10-CM

## 2012-03-04 DIAGNOSIS — F1920 Other psychoactive substance dependence, uncomplicated: Secondary | ICD-10-CM

## 2012-03-04 DIAGNOSIS — R197 Diarrhea, unspecified: Secondary | ICD-10-CM | POA: Insufficient documentation

## 2012-03-04 DIAGNOSIS — F1120 Opioid dependence, uncomplicated: Secondary | ICD-10-CM | POA: Insufficient documentation

## 2012-03-04 DIAGNOSIS — Z23 Encounter for immunization: Secondary | ICD-10-CM | POA: Insufficient documentation

## 2012-03-04 DIAGNOSIS — R0602 Shortness of breath: Secondary | ICD-10-CM | POA: Insufficient documentation

## 2012-03-04 DIAGNOSIS — R1012 Left upper quadrant pain: Principal | ICD-10-CM | POA: Insufficient documentation

## 2012-03-04 HISTORY — DX: Post-traumatic stress disorder, unspecified: F43.10

## 2012-03-04 HISTORY — DX: Pneumonia, unspecified organism: J18.9

## 2012-03-04 HISTORY — DX: Heart failure, unspecified: I50.9

## 2012-03-04 HISTORY — DX: Type 2 diabetes mellitus without complications: E11.9

## 2012-03-04 HISTORY — DX: Low back pain: M54.5

## 2012-03-04 HISTORY — DX: Acute myocardial infarction, unspecified: I21.9

## 2012-03-04 HISTORY — DX: Low back pain, unspecified: M54.50

## 2012-03-04 HISTORY — DX: Other chronic pain: G89.29

## 2012-03-04 HISTORY — DX: Unspecified viral hepatitis B without hepatic coma: B19.10

## 2012-03-04 LAB — COMPREHENSIVE METABOLIC PANEL
Alkaline Phosphatase: 91 U/L (ref 39–117)
BUN: 10 mg/dL (ref 6–23)
CO2: 27 mEq/L (ref 19–32)
Chloride: 96 mEq/L (ref 96–112)
Creatinine, Ser: 0.75 mg/dL (ref 0.50–1.35)
GFR calc Af Amer: >90 mL/min (ref >90)
GFR calc non Af Amer: >90 mL/min (ref >90)
Glucose, Bld: 331 mg/dL — ABNORMAL HIGH (ref 70–99)
Potassium: 3.4 mEq/L — ABNORMAL LOW (ref 3.5–5.1)
Total Bilirubin: 0.3 mg/dL (ref 0.3–1.2)

## 2012-03-04 LAB — URINE MICROSCOPIC-ADD ON

## 2012-03-04 LAB — CBC
MCV: 95.7 fL (ref 78.0–100.0)
Platelets: 218 10*3/uL (ref 150–400)
RBC: 5.09 MIL/uL (ref 4.22–5.81)
WBC: 9.6 10*3/uL (ref 4.0–10.5)

## 2012-03-04 LAB — URINALYSIS, ROUTINE W REFLEX MICROSCOPIC
Ketones, ur: 15 mg/dL — AB
Nitrite: NEGATIVE
Protein, ur: 100 mg/dL — AB
pH: 5.5 (ref 5.0–8.0)

## 2012-03-04 LAB — CBC WITH DIFFERENTIAL/PLATELET
Basophils Relative: 0 % (ref 0–1)
HCT: 47.8 % (ref 39.0–52.0)
Hemoglobin: 16.4 g/dL (ref 13.0–17.0)
Lymphocytes Relative: 18 % (ref 12–46)
Lymphs Abs: 1.8 10*3/uL (ref 0.7–4.0)
MCHC: 34.3 g/dL (ref 30.0–36.0)
Monocytes Absolute: 1.1 10*3/uL — ABNORMAL HIGH (ref 0.1–1.0)
Monocytes Relative: 11 % (ref 3–12)
Neutro Abs: 7.4 10*3/uL (ref 1.7–7.7)
Neutrophils Relative %: 72 % (ref 43–77)
RBC: 5.08 MIL/uL (ref 4.22–5.81)

## 2012-03-04 LAB — GLUCOSE, CAPILLARY: Glucose-Capillary: 268 mg/dL — ABNORMAL HIGH (ref 70–99)

## 2012-03-04 LAB — CREATININE, SERUM
GFR calc Af Amer: >90 mL/min (ref >90)
GFR calc non Af Amer: >90 mL/min (ref >90)

## 2012-03-04 LAB — LIPASE, BLOOD: Lipase: 22 U/L (ref 11–59)

## 2012-03-04 MED ORDER — PANTOPRAZOLE SODIUM 40 MG PO TBEC
40.0000 mg | DELAYED_RELEASE_TABLET | Freq: Every day | ORAL | Status: DC
Start: 2012-03-04 — End: 2012-03-06
  Administered 2012-03-04 – 2012-03-06 (×3): 40 mg via ORAL
  Filled 2012-03-04 (×3): qty 1

## 2012-03-04 MED ORDER — HYDROMORPHONE HCL PF 1 MG/ML IJ SOLN
1.0000 mg | Freq: Once | INTRAMUSCULAR | Status: AC
  Administered 2012-03-04: 1 mg via INTRAVENOUS
  Filled 2012-03-04: qty 1

## 2012-03-04 MED ORDER — PANTOPRAZOLE SODIUM 40 MG IV SOLR: 40.0000 mg | INTRAVENOUS | Status: DC

## 2012-03-04 MED ORDER — DULOXETINE HCL 30 MG PO CPEP
30.0000 mg | ORAL_CAPSULE | Freq: Every day | ORAL | Status: DC
  Administered 2012-03-05 – 2012-03-06 (×2): 30 mg via ORAL
  Filled 2012-03-04 (×3): qty 1

## 2012-03-04 MED ORDER — LACTULOSE 10 GM/15ML PO SOLN
20.0000 g | Freq: Two times a day (BID) | ORAL | Status: DC
  Administered 2012-03-04 – 2012-03-06 (×4): 20 g via ORAL
  Filled 2012-03-04 (×5): qty 30

## 2012-03-04 MED ORDER — ACETAMINOPHEN 650 MG RE SUPP: 650.0000 mg | Freq: Four times a day (QID) | RECTAL | Status: DC | PRN

## 2012-03-04 MED ORDER — GLIPIZIDE ER 10 MG PO TB24
10.0000 mg | ORAL_TABLET | Freq: Two times a day (BID) | ORAL | Status: DC
  Administered 2012-03-04 – 2012-03-05 (×2): 10 mg via ORAL
  Filled 2012-03-04 (×3): qty 1

## 2012-03-04 MED ORDER — INFLUENZA VIRUS VACC SPLIT PF IM SUSP
0.5000 mL | INTRAMUSCULAR | Status: AC
  Administered 2012-03-05: 0.5 mL via INTRAMUSCULAR
  Filled 2012-03-04: qty 0.5

## 2012-03-04 MED ORDER — ACETAMINOPHEN 325 MG PO TABS: 650.0000 mg | ORAL_TABLET | Freq: Four times a day (QID) | ORAL | Status: DC | PRN

## 2012-03-04 MED ORDER — ONDANSETRON HCL 4 MG/2ML IJ SOLN
4.0000 mg | Freq: Once | INTRAMUSCULAR | Status: AC
  Administered 2012-03-04: 4 mg via INTRAVENOUS
  Filled 2012-03-04: qty 2

## 2012-03-04 MED ORDER — INSULIN ASPART PROT & ASPART (70-30 MIX) 100 UNIT/ML ~~LOC~~ SUSP
8.0000 [IU] | Freq: Two times a day (BID) | SUBCUTANEOUS | Status: DC
  Administered 2012-03-05 – 2012-03-06 (×3): 8 [IU] via SUBCUTANEOUS
  Filled 2012-03-04: qty 3

## 2012-03-04 MED ORDER — OXYCODONE HCL 5 MG PO TABS
30.0000 mg | ORAL_TABLET | ORAL | Status: DC | PRN
  Administered 2012-03-04 – 2012-03-06 (×10): 30 mg via ORAL
  Filled 2012-03-04 (×4): qty 6
  Filled 2012-03-04: qty 2
  Filled 2012-03-04 (×6): qty 6

## 2012-03-04 MED ORDER — METOCLOPRAMIDE HCL 5 MG/ML IJ SOLN
10.0000 mg | Freq: Once | INTRAMUSCULAR | Status: AC
  Administered 2012-03-04: 10 mg via INTRAVENOUS
  Filled 2012-03-04: qty 2

## 2012-03-04 MED ORDER — INSULIN ASPART 100 UNIT/ML ~~LOC~~ SOLN
0.0000 [IU] | Freq: Three times a day (TID) | SUBCUTANEOUS | Status: DC
  Administered 2012-03-04: 8 [IU] via SUBCUTANEOUS
  Administered 2012-03-05 (×3): 2 [IU] via SUBCUTANEOUS
  Administered 2012-03-06: 3 [IU] via SUBCUTANEOUS

## 2012-03-04 MED ORDER — INSULIN LISPRO PROT & LISPRO (75-25 MIX) 100 UNIT/ML ~~LOC~~ SUSP: 8.0000 [IU] | Freq: Two times a day (BID) | SUBCUTANEOUS | Status: DC

## 2012-03-04 MED ORDER — SODIUM CHLORIDE 0.9 % IV SOLN
INTRAVENOUS | Status: DC
  Administered 2012-03-04 (×2): via INTRAVENOUS
  Administered 2012-03-05: 125 mL via INTRAVENOUS
  Administered 2012-03-05 – 2012-03-06 (×2): via INTRAVENOUS

## 2012-03-04 MED ORDER — PNEUMOCOCCAL VAC POLYVALENT 25 MCG/0.5ML IJ INJ
0.5000 mL | INJECTION | INTRAMUSCULAR | Status: AC
  Administered 2012-03-05: 0.5 mL via INTRAMUSCULAR
  Filled 2012-03-04: qty 0.5

## 2012-03-04 MED ORDER — ENOXAPARIN SODIUM 40 MG/0.4ML ~~LOC~~ SOLN
40.0000 mg | SUBCUTANEOUS | Status: DC
  Administered 2012-03-04 – 2012-03-05 (×2): 40 mg via SUBCUTANEOUS
  Filled 2012-03-04 (×3): qty 0.4

## 2012-03-04 MED ORDER — SENNOSIDES-DOCUSATE SODIUM 8.6-50 MG PO TABS: 1.0000 | ORAL_TABLET | Freq: Two times a day (BID) | ORAL | Status: DC | PRN

## 2012-03-04 MED ORDER — GABAPENTIN 400 MG PO CAPS
400.0000 mg | ORAL_CAPSULE | Freq: Three times a day (TID) | ORAL | Status: DC
  Administered 2012-03-04 – 2012-03-06 (×7): 400 mg via ORAL
  Filled 2012-03-04 (×8): qty 1

## 2012-03-04 MED ORDER — SODIUM CHLORIDE 0.9 % IV BOLUS (SEPSIS)
500.0000 mL | Freq: Once | INTRAVENOUS | Status: AC
  Administered 2012-03-04: 500 mL via INTRAVENOUS

## 2012-03-04 MED ORDER — INSULIN ASPART 100 UNIT/ML ~~LOC~~ SOLN: 0.0000 [IU] | Freq: Three times a day (TID) | SUBCUTANEOUS | Status: DC

## 2012-03-04 MED ORDER — ONDANSETRON HCL 4 MG/2ML IJ SOLN
4.0000 mg | Freq: Four times a day (QID) | INTRAMUSCULAR | Status: DC | PRN
  Administered 2012-03-05: 4 mg via INTRAVENOUS
  Filled 2012-03-04: qty 2

## 2012-03-04 MED ORDER — ALPRAZOLAM 0.5 MG PO TABS
1.0000 mg | ORAL_TABLET | Freq: Two times a day (BID) | ORAL | Status: DC
  Administered 2012-03-04 – 2012-03-06 (×4): 1 mg via ORAL
  Filled 2012-03-04: qty 1
  Filled 2012-03-04 (×3): qty 2

## 2012-03-04 MED ORDER — HYDROMORPHONE HCL PF 1 MG/ML IJ SOLN
1.0000 mg | INTRAMUSCULAR | Status: AC | PRN
  Administered 2012-03-04 – 2012-03-05 (×2): 1 mg via INTRAVENOUS
  Filled 2012-03-04 (×2): qty 1

## 2012-03-04 MED ORDER — LOSARTAN POTASSIUM 50 MG PO TABS
100.0000 mg | ORAL_TABLET | Freq: Every day | ORAL | Status: DC
  Administered 2012-03-05 – 2012-03-06 (×2): 100 mg via ORAL
  Filled 2012-03-04 (×2): qty 2

## 2012-03-04 MED ORDER — ONDANSETRON HCL 4 MG/2ML IJ SOLN: 4.0000 mg | Freq: Three times a day (TID) | INTRAMUSCULAR | Status: DC | PRN

## 2012-03-04 MED ORDER — OXYCODONE HCL ER 20 MG PO T12A
60.0000 mg | EXTENDED_RELEASE_TABLET | Freq: Two times a day (BID) | ORAL | Status: DC
  Administered 2012-03-04 – 2012-03-06 (×4): 60 mg via ORAL
  Filled 2012-03-04 (×4): qty 3

## 2012-03-04 MED ORDER — ASPIRIN EC 81 MG PO TBEC
81.0000 mg | DELAYED_RELEASE_TABLET | Freq: Every day | ORAL | Status: DC
  Administered 2012-03-05 – 2012-03-06 (×2): 81 mg via ORAL
  Filled 2012-03-04 (×2): qty 1

## 2012-03-04 NOTE — ED Notes (Signed)
PATIENT STATES HE HAS BEEN HAVING SEVERE ABDOMINAL PAINS FOR THE PAST WEEK WITH N/V . STATES HE HAS NOT BEEN ABLE TO KEEP ANY OF HIS MEDS DOWN OR ANY NOURISHMENT DOWN. MUCOUS MEMBRANES PINK AND MOIST. PT LYING STRETCHED OUT ON STRETCHER.. NO ACTIVE EMESIS

## 2012-03-04 NOTE — ED Notes (Signed)
Pt to radiology.

## 2012-03-04 NOTE — H&P (Signed)
[facility] History and Physical  Ryan [name]:[id number] DOB: Aug 05, 1950 DOA: 03/04/2012  Referring physician: EDP Dr.Pickering PCP: Warrick Parisian, MD  Gi Dr.Perry  Chief Complaint: abdominal pain  HPI: Ryan [name] is a 61 y.o. male  with multiple medical problems including hypertension, insulin requiring diabetes mellitus, morbid obesity, coronary artery disease with PCI, chronic abdominal pain with chronic pain syndrome requiring chronic narcotics, and anxiety presents to [facility] ER with worsening of his chronic abd pain, he reports nausea and vomiting for past 3 days, decreased PO intake.  This is his 17th ER visit in 6months and has already had 12 Ct abdpelvis this year so far, In May 2013 he had an EGD which showed esophagitis and ulcer at GE junction, FU EGD in 9/13 was normal. GAstric emptying scan 6/13 was normal. He tells me that he may have taken more pain medications in last few days due to vomiting and may have almost run out of narcotics but is not sure about this. Denies NSAID use    Review of Systems:  The patient denies anorexia, fever, weight loss,, vision loss, decreased hearing, hoarseness, chest pain, syncope, dyspnea on exertion, peripheral edema, balance deficits, hemoptysis, abdominal pain, melena, hematochezia, severe indigestion/heartburn, hematuria, incontinence, genital sores, muscle weakness, suspicious skin lesions, transient blindness, difficulty walking, depression, unusual weight change, abnormal bleeding, enlarged lymph nodes, angioedema, and breast masses. Nausea, Vomiting, diarrhea   Past Medical History  Diagnosis Date  . Hypertension   . Diabetes mellitus   . DVT (deep venous thrombosis)   . Chronic back pain   . 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 Hyperlipidemia   . CAD (coronary atherosclerotic disease)     s/p stents x2  . Small bowel obstruction   . Pancreatitis   . Anxiety   . GERD  (gastroesophageal reflux disease)    Past Surgical History  Procedure Date  . Coronary angioplasty with stent placement   . Tonsillectomy and adenoidectomy   . Cholecystectomy   . Back surgery   . Esophagogastroduodenoscopy 09/22/2011    Procedure: ESOPHAGOGASTRODUODENOSCOPY (EGD);  Surgeon: Charna Elizabeth, MD;  Location: [facility] ENDOSCOPY;  Service: Endoscopy;  Laterality: N/A;  . Tonsillectomy   . Esophagogastroduodenoscopy 01/05/2012    Procedure: ESOPHAGOGASTRODUODENOSCOPY (EGD);  Surgeon: Rachael Fee, MD;  Location: [facility] ENDOSCOPY;  Service: Endoscopy;  Laterality: N/A;   Social History:  reports that he has been smoking Cigarettes.  He has a 7.5 pack-year smoking history. He has never used smokeless tobacco. He reports that he drinks alcohol. He reports that he does not use illicit drugs. Lives at home, has a housemate/room mate  Allergies  Allergen Reactions  . Lisinopril Swelling and Rash  . Morphine And Related Swelling and Rash    Family History  Problem Relation Age of Onset  . Diabetes Father   . Colon cancer Neg Hx     Prior to Admission medications   Medication Sig Start Date End Date Taking? Authorizing Provider  ALPRAZolam Prudy Feeler) 1 MG tablet Take 1 tablet (1 mg total) by mouth 2 (two) times daily. 01/06/12  Yes Cristal Ford, MD  aspirin EC 81 MG tablet Take 81 mg by mouth daily.   Yes Historical Provider, MD  DULoxetine (CYMBALTA) 30 MG capsule Take 1 capsule (30 mg total) by mouth daily. 11/04/11 11/03/12 Yes Shanker Levora Dredge, MD  gabapentin (NEURONTIN) 400 MG capsule Take 400 mg by mouth 3 (three) times daily.   Yes  Historical Provider, MD  glipiZIDE (GLUCOTROL XL) 10 MG 24 hr tablet Take 10 mg by mouth 2 (two) times daily.   Yes Historical Provider, MD  insulin lispro protamine-insulin lispro (HUMALOG 75/25) (75-25) 100 UNIT/ML SUSP Inject 12 Units into the skin 2 (two) times daily with a meal.   Yes Historical Provider, MD  lactulose (CHRONULAC) 10 GM/15ML solution Take  20 g by mouth 2 (two) times daily.   Yes Historical Provider, MD  losartan (COZAAR) 100 MG tablet Take 100 mg by mouth daily.   Yes Historical Provider, MD  metFORMIN (GLUCOPHAGE) 850 MG tablet Take 850 mg by mouth 3 (three) times daily.   Yes Historical Provider, MD  metoCLOPramide (REGLAN) 10 MG tablet Take 10 mg by mouth every 6 (six) hours as needed. For nausea 02/05/12  Yes Flint Melter, MD  ondansetron (ZOFRAN) 4 MG tablet Take 4 mg by mouth every 6 (six) hours as needed. For nausea 01/23/12  Yes Tiffany Irine Seal, PA  oxycodone (ROXICODONE) 30 MG immediate release tablet Take 1 tablet (30 mg total) by mouth every 4 (four) hours as needed. For breakthrough pain 01/06/12  Yes Cristal Ford, MD  Oxycodone HCl (OXYCONTIN) 60 MG TB12 Take 60 mg by mouth every 12 (twelve) hours.   Yes Historical Provider, MD  pantoprazole (PROTONIX) 40 MG tablet Take 1 tablet (40 mg total) by mouth daily. 01/06/12 01/05/13 Yes Srikar Cherlynn Kaiser, MD  promethazine (PHENERGAN) 25 MG tablet Take 25 mg by mouth every 6 (six) hours as needed. For nausea   Yes Historical Provider, MD  Ranitidine HCl (ZANTAC PO) Take 300 mg by mouth 2 (two) times daily.    Yes Historical Provider, MD  sennosides-docusate sodium (SENOKOT-S) 8.6-50 MG tablet Take 1 tablet by mouth 2 (two) times daily as needed. For constipation   Yes Historical Provider, MD   Physical Exam: Filed Vitals:   03/04/12 0854 03/04/12 0935 03/04/12 1357  BP: 153/92 133/72 125/64  Pulse: 90  71  Temp: 98.1 F (36.7 C)    TempSrc: Oral    Resp: 24  9  SpO2: 93%  95%   General: AAOx3, no distress HEENT: PERLA, EOMI, no JVD Cardiovascular: RRR , no m/r/g Respiratory: CTAB  Abdomen: soft,+BS, mild LLQ tenderness, no rigidity or rebound, ND  Extremities: no cyanosis, no edema Skin: no rashes or skin breakdown Neuro: moves all extremities  Labs on Admission:  Basic Metabolic Panel:  Lab 03/04/12 1610 03/01/12 0930  NA 137 132*  K 3.4* 3.8  CL 96 96  CO2 27  27  GLUCOSE 331* 274*  BUN 10 10  CREATININE 0.75 0.67  CALCIUM 10.5 9.2  MG -- --  PHOS -- --   Liver Function Tests:  Lab 03/04/12 0922 03/01/12 0930  AST 9 10  ALT 7 9  ALKPHOS 91 79  BILITOT 0.3 0.2*  PROT 8.2 7.0  ALBUMIN 3.9 3.3*    Lab 03/04/12 0922 03/01/12 0930  LIPASE 22 22  AMYLASE -- --   No results found for this basename: AMMONIA:5 in the last 168 hours CBC:  Lab 03/04/12 0922 03/01/12 1035 03/01/12 0930  WBC 10.4 9.0 9.9  NEUTROABS 7.4 6.1 SPECIMEN CLOTTED  HGB 16.4 13.0 14.0  HCT 47.8 39.9 42.0  MCV 94.1 95.0 95.2  PLT 252 210 SPECIMEN CLOTTED   Cardiac Enzymes:  Lab 03/04/12 0923  CKTOTAL --  CKMB --  CKMBINDEX --  TROPONINI <0.30    BNP (last 3 results) No results  found for this basename: PROBNP:3 in the last 8760 hours CBG: No results found for this basename: GLUCAP:5 in the last 168 hours  Radiological Exams on Admission: Dg Abd Acute W/chest  03/04/2012  *RADIOLOGY REPORT*  Clinical Data: Left sided abdominal pain for 1 week; sob, n/v  Pt has been out of diabetes and HTN med for past week; cholecystectomy, no chest surgeries  ACUTE ABDOMEN SERIES (ABDOMEN 2 VIEW & CHEST 1 VIEW)  Comparison: 03/01/2012; 02/05/2012  Findings: Stable appearance of the lungs noted with indistinct bilateral interstitial accentuation.  Coronary stent noted.  No cardiomegaly.  Thoracic spondylosis is present.  Abnormal air-fluid levels are present in small and large bowel. Mild to moderately dilated loops of small bowel noted.  Prior lower lumbar posterolateral rod pedicle screw fixation noted. Cholecystectomy clips are present.  IMPRESSION:  1.  Mildly dilated small bowel with air-fluid levels in large and small bowel.  Bowel gas pattern is abnormal but nonspecific and could be seen in the setting of ileus or early obstruction.   Original Report Authenticated By: Gaylyn Rong, M.D.     EKG: Independently reviewed. LVH without ST T wave  changes  Assessment/Plan  1. Acute on Chronic abdominal pain/Nausea/Vomiting CT 11/3 unremarkable Suspect periodic withdrawals/ narcotic bowel Chronic narcotic dependence Last EGD 9/13 normal Gastric emptying scan 6/13 normal Supportive care with IVF, Clears, IV PPI IV narcotics for 12 hours only, this has been clearly stated to and accepted by the patient Needs PSych follow up for chronic narcotic dependence  2. DM: resume insulin 70/30 at lower dose, SSI  3. H/o CAD/PCI: EKG unchanged, continue ASA  4. Chronic pain/Narcotic dependence: This continues to remain the main challenge Continue home dose of oxycontin and oxycodone, will need to be weaned by PCP   5. DVt prophylaxis: lovenox  Code Status: Full Family Communication: patient himself Disposition Plan: home tomorrow  Time spent:  [facility] Pager [phone]  If 7PM-7AM, please contact night-coverage [email or web address] Password [email or web address] 03/04/2012, 2:45 PM

## 2012-03-04 NOTE — ED Notes (Signed)
Pt c/o abd pain and vomiting X1w, worse since yesterday, also reports being SOB since yesterday, fever 2d ago, diarrhea, X2-3d, denies CP, NAD

## 2012-03-04 NOTE — ED Provider Notes (Signed)
History     CSN: [id number]  Arrival date & time 03/04/12  0841   First MD Initiated Contact with Patient 03/04/12 [phone]      Chief Complaint  Patient presents with  . Emesis  . Abdominal Pain    (Consider location/radiation/quality/duration/timing/severity/associated sxs/prior treatment) Patient is a 61 y.o. male presenting with vomiting and abdominal pain. The history is provided by the patient.  Emesis  This is a chronic problem. Associated symptoms include abdominal pain, chills and diarrhea. Pertinent negatives include no headaches.  Abdominal Pain The primary symptoms of the illness include abdominal pain, fatigue, shortness of breath, nausea, vomiting and diarrhea. The primary symptoms of the illness do not include dysuria.  Additional symptoms associated with the illness include chills. Symptoms associated with the illness do not include back pain.   patient presents with nausea vomiting abdominal pain. His history chronic abdominal pain possibly chronic pancreatitis. He states it fever of 1022 days ago. He states he's also had some diarrhea. He states it also had some left-sided chest pain and shortness of breath. He states he's been unable to keep his medications down for the last few days. He states he still has his pain medications but thinks he is withdrawing. He states he has been coughing up some sputum. He states it is also has vomiting.  Past Medical History  Diagnosis Date  . Hypertension   . Diabetes mellitus   . DVT (deep venous thrombosis)   . Chronic back pain   . 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 Hyperlipidemia   . CAD (coronary atherosclerotic disease)     s/p stents x2  . Small bowel obstruction   . Pancreatitis   . Anxiety   . GERD (gastroesophageal reflux disease)     Past Surgical History  Procedure Date  . Coronary angioplasty with stent placement   . Tonsillectomy and adenoidectomy   . Cholecystectomy   . Back  surgery   . Esophagogastroduodenoscopy 09/22/2011    Procedure: ESOPHAGOGASTRODUODENOSCOPY (EGD);  Surgeon: Charna Elizabeth, MD;  Location: [facility] ENDOSCOPY;  Service: Endoscopy;  Laterality: N/A;  . Tonsillectomy   . Esophagogastroduodenoscopy 01/05/2012    Procedure: ESOPHAGOGASTRODUODENOSCOPY (EGD);  Surgeon: Rachael Fee, MD;  Location: [facility] ENDOSCOPY;  Service: Endoscopy;  Laterality: N/A;    Family History  Problem Relation Age of Onset  . Diabetes Father   . Colon cancer Neg Hx     History  Substance Use Topics  . Smoking status: Current Some Day Smoker -- 0.2 packs/day for 30 years    Types: Cigarettes  . Smokeless tobacco: Never Used  . Alcohol Use: Yes     Comment: occ      Review of Systems  Constitutional: Positive for chills and fatigue. Negative for activity change.  Respiratory: Positive for shortness of breath.   Cardiovascular: Positive for chest pain.  Gastrointestinal: Positive for nausea, vomiting, abdominal pain and diarrhea.  Genitourinary: Negative for dysuria.  Musculoskeletal: Negative for back pain.  Neurological: Negative for headaches.  Hematological: Negative for adenopathy.    Allergies  Lisinopril and Morphine and related  Home Medications   Current Outpatient Rx  Name  Route  Sig  Dispense  Refill  . ALPRAZOLAM 1 MG PO TABS   Oral   Take 1 tablet (1 mg total) by mouth 2 (two) times daily.   4 tablet   0   . ASPIRIN EC 81 MG PO TBEC   Oral  Take 81 mg by mouth daily.         . DULOXETINE HCL 30 MG PO CPEP   Oral   Take 1 capsule (30 mg total) by mouth daily.         Marland Kitchen GABAPENTIN 400 MG PO CAPS   Oral   Take 400 mg by mouth 3 (three) times daily.         Marland Kitchen GLIPIZIDE ER 10 MG PO TB24   Oral   Take 10 mg by mouth 2 (two) times daily.         . INSULIN LISPRO PROT & LISPRO (75-25) 100 UNIT/ML [place] SUSP   Subcutaneous   Inject 12 Units into the skin 2 (two) times daily with a meal.         . LACTULOSE 10 GM/15ML PO SOLN    Oral   Take 20 g by mouth 2 (two) times daily.         Marland Kitchen LOSARTAN POTASSIUM 100 MG PO TABS   Oral   Take 100 mg by mouth daily.         Marland Kitchen METFORMIN HCL 850 MG PO TABS   Oral   Take 850 mg by mouth 3 (three) times daily.         Marland Kitchen METOCLOPRAMIDE HCL 10 MG PO TABS   Oral   Take 10 mg by mouth every 6 (six) hours as needed. For nausea         . ONDANSETRON HCL 4 MG PO TABS   Oral   Take 4 mg by mouth every 6 (six) hours as needed. For nausea         . OXYCODONE HCL 30 MG PO TABS   Oral   Take 1 tablet (30 mg total) by mouth every 4 (four) hours as needed. For breakthrough pain   12 tablet   0   . OXYCODONE HCL ER 60 MG PO TB12   Oral   Take 60 mg by mouth every 12 (twelve) hours.         Marland Kitchen PANTOPRAZOLE SODIUM 40 MG PO TBEC   Oral   Take 1 tablet (40 mg total) by mouth daily.   30 tablet   0   . PROMETHAZINE HCL 25 MG PO TABS   Oral   Take 25 mg by mouth every 6 (six) hours as needed. For nausea         . ZANTAC PO   Oral   Take 300 mg by mouth 2 (two) times daily.          . SENNA-DOCUSATE SODIUM 8.6-50 MG PO TABS   Oral   Take 1 tablet by mouth 2 (two) times daily as needed. For constipation           BP 124/70  Pulse 73  Temp 98.1 F (36.7 C) (Oral)  Resp 16  SpO2 96%  Physical Exam  Constitutional: He is oriented to person, place, and time. He appears well-developed.  Eyes: Pupils are equal, round, and reactive to light.  Cardiovascular: Normal rate.   Pulmonary/Chest: Effort normal and breath sounds normal.  Abdominal: There is tenderness.       Left upper quadrant tenderness without mass  Musculoskeletal: Normal range of motion.  Neurological: He is alert and oriented to person, place, and time.    ED Course  Procedures (including critical care time)  Labs Reviewed  CBC WITH DIFFERENTIAL - Abnormal; Notable for the following:    Monocytes Absolute  1.1 (*)     All other components within normal limits  COMPREHENSIVE  METABOLIC PANEL - Abnormal; Notable for the following:    Potassium 3.4 (*)     Glucose, Bld 331 (*)     All other components within normal limits  URINALYSIS, ROUTINE W REFLEX MICROSCOPIC - Abnormal; Notable for the following:    Color, Urine AMBER (*)  BIOCHEMICALS MAY BE AFFECTED BY COLOR   APPearance CLOUDY (*)     Specific Gravity, Urine 1.039 (*)     Glucose, UA 100 (*)     Bilirubin Urine SMALL (*)     Ketones, ur 15 (*)     Protein, ur 100 (*)     Leukocytes, UA SMALL (*)     All other components within normal limits  URINE MICROSCOPIC-ADD ON - Abnormal; Notable for the following:    Crystals CA OXALATE CRYSTALS (*)     All other components within normal limits  LIPASE, BLOOD  TROPONIN I   Dg Abd Acute W/chest  03/04/2012  *RADIOLOGY REPORT*  Clinical Data: Left sided abdominal pain for 1 week; sob, n/v  Pt has been out of diabetes and HTN med for past week; cholecystectomy, no chest surgeries  ACUTE ABDOMEN SERIES (ABDOMEN 2 VIEW & CHEST 1 VIEW)  Comparison: 03/01/2012; 02/05/2012  Findings: Stable appearance of the lungs noted with indistinct bilateral interstitial accentuation.  Coronary stent noted.  No cardiomegaly.  Thoracic spondylosis is present.  Abnormal air-fluid levels are present in small and large bowel. Mild to moderately dilated loops of small bowel noted.  Prior lower lumbar posterolateral rod pedicle screw fixation noted. Cholecystectomy clips are present.  IMPRESSION:  1.  Mildly dilated small bowel with air-fluid levels in large and small bowel.  Bowel gas pattern is abnormal but nonspecific and could be seen in the setting of ileus or early obstruction.   Original Report Authenticated By: Gaylyn Rong, M.D.      1. Nausea and vomiting   2. Abdominal pain      Date: 03/04/2012  Rate: 90  Rhythm: normal sinus rhythm  QRS Axis: normal  Intervals: normal  ST/T Wave abnormalities: normal  Conduction Disutrbances:none  Narrative Interpretation:   Old  EKG Reviewed: unchanged    MDM  Patient presents with abdominal pain nausea and vomiting. Has a history of same with multiple valuations. Lab works reassuring urine shows some concentration. Is not tolerate orals here and will be admitted.        Juliet Rude. Rubin Payor, MD 03/04/12 1528

## 2012-03-05 ENCOUNTER — Observation Stay (HOSPITAL_COMMUNITY): Payer: Medicare Other

## 2012-03-05 DIAGNOSIS — E876 Hypokalemia: Secondary | ICD-10-CM

## 2012-03-05 DIAGNOSIS — I10 Essential (primary) hypertension: Secondary | ICD-10-CM

## 2012-03-05 DIAGNOSIS — R109 Unspecified abdominal pain: Secondary | ICD-10-CM

## 2012-03-05 LAB — GLUCOSE, CAPILLARY
Glucose-Capillary: 149 mg/dL — ABNORMAL HIGH (ref 70–99)
Glucose-Capillary: 126 mg/dL — ABNORMAL HIGH (ref 70–99)
Glucose-Capillary: 138 mg/dL — ABNORMAL HIGH (ref 70–99)

## 2012-03-05 LAB — CBC
HCT: 44.7 % (ref 39.0–52.0)
Hemoglobin: 14.8 g/dL (ref 13.0–17.0)
RBC: 4.69 MIL/uL (ref 4.22–5.81)

## 2012-03-05 LAB — BASIC METABOLIC PANEL
Chloride: 99 mEq/L (ref 96–112)
GFR calc Af Amer: >90 mL/min (ref >90)
GFR calc non Af Amer: >90 mL/min (ref >90)
Glucose, Bld: 137 mg/dL — ABNORMAL HIGH (ref 70–99)
Potassium: 3.4 mEq/L — ABNORMAL LOW (ref 3.5–5.1)
Sodium: 137 mEq/L (ref 135–145)

## 2012-03-05 LAB — MAGNESIUM: Magnesium: 2 mg/dL (ref 1.5–2.5)

## 2012-03-05 MED ORDER — POTASSIUM CHLORIDE CRYS ER 20 MEQ PO TBCR
40.0000 meq | EXTENDED_RELEASE_TABLET | Freq: Once | ORAL | Status: AC
  Administered 2012-03-05: 40 meq via ORAL
  Filled 2012-03-05: qty 2

## 2012-03-05 MED ORDER — POTASSIUM CHLORIDE 10 MEQ/100ML IV SOLN
10.0000 meq | INTRAVENOUS | Status: DC
  Administered 2012-03-05 (×2): 10 meq via INTRAVENOUS
  Filled 2012-03-05 (×5): qty 100

## 2012-03-05 MED ORDER — KETOROLAC TROMETHAMINE 30 MG/ML IJ SOLN
30.0000 mg | Freq: Four times a day (QID) | INTRAMUSCULAR | Status: DC | PRN
  Filled 2012-03-05: qty 1

## 2012-03-05 MED ORDER — HYDROMORPHONE HCL PF 1 MG/ML IJ SOLN: 0.5000 mg | INTRAMUSCULAR | Status: DC | PRN

## 2012-03-05 NOTE — Care Management Note (Signed)
    Page 1 of 1   03/06/2012     5:57:45 PM   CARE MANAGEMENT NOTE 03/06/2012  Patient:  Ryan [name],Ryan [name]   Account Number:  [id number]  Date Initiated:  03/05/2012  Documentation initiated by:  Letha Cape  Subjective/Objective Assessment:   dx abd pain, n/v  admit-  pt had [facility] in the past.     Action/Plan:   Anticipated DC Date:  03/06/2012   Anticipated DC Plan:  HOME W HOME HEALTH SERVICES      DC Planning Services  CM consult      Choice offered to / List presented to:             Status of service:  Completed, signed off Medicare Important Message given?   (If response is "NO", the following Medicare IM given date fields will be blank) Date Medicare IM given:   Date Additional Medicare IM given:    Discharge Disposition:  HOME/SELF CARE  Per UR Regulation:  Reviewed for med. necessity/level of care/duration of stay  If discussed at Long Length of Stay Meetings, dates discussed:    Comments:  03/06/12 17:56 Letha Cape RN, BSN [phone] patient dc today, CSW helped patient with [name] bus pass to get home.  03/05/12 15:36 Letha Cape RN, BSN [phone] patient has had [facility] in the past, NCM will continue to follow for dc needs.

## 2012-03-05 NOTE — Progress Notes (Signed)
Brief Nutrition Note:   RD pulled to pt from the MST (Malnutrition Screening Tool) score of 4. Pt states he has lost 20-25 lbs and has a poor appetite.  Per pt, he does not weigh himself regularly, but can tell he has lost weight. Weight hx does not support this weight loss claim, weight stable for about 3 months. Some weight loss would be beneficial for this pt.   Wt Readings from Last 10 Encounters:  03/04/12 322 lb 1.5 oz (146.1 kg)  03/01/12 325 lb (147.419 kg)  01/06/12 326 lb 4.5 oz (148 kg)  01/06/12 326 lb 4.5 oz (148 kg)  12/12/11 338 lb (153.316 kg)  11/04/11 306 lb 1.6 oz (138.846 kg)  10/23/11 289 lb (131.09 kg)  10/20/11 289 lb 14.5 oz (131.5 kg)  10/11/11 290 lb (131.543 kg)  09/21/11 306 lb 7 oz (139 kg)    Pt currently on clear liquid diet. States he is not hungry but does want some more to drink.   Chart reviewed. No nutrition interventions warranted at this time. Please consult as needed.   Clarene Duke RD, LDN Pager [phone] After Hours pager [phone]

## 2012-03-05 NOTE — H&P (Signed)
Ryan [name] [birth year]  [id number].   Primary Care MD: Warrick Parisian, MD  Requesting MD: Ramiro Harvest, MD Internal Medicine.  Chief Complaint/Reason for Consult: Abdominal pain HPI: 61 y/o male with multiple medical problems admitted for acute on chronic abdominal pain. He reports to me that he has been with intense LLQ abdominal pain accompanied by nausea, NB-NB vomiting for about a week and diarrhea for the past 3 days. He states that the past two diarrheas (24 h ago) were coffee ground-like. Also reports one episode of fever of 102 and chills. Pt reports flatus and has kept down his current clear diet.   ROS Pt denies SOB, chest pain, palpitations, headaches, dizziness, numbness or weakness. No frequency or dysuria. Claims unintentional weigh loss.   Family History  Problem Relation Age of Onset  . Diabetes Father   . Colon cancer Neg Hx     Past Medical History  Diagnosis Date  . Hypertension   . DVT (deep venous thrombosis) ~ 2011    BLE  . 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 Hyperlipidemia   . CAD (coronary atherosclerotic disease)     s/p stents x2  . Small bowel obstruction   . Pancreatitis   . Anxiety   . GERD (gastroesophageal reflux disease)   . CHF (congestive heart failure)   . Myocardial infarction ~ 2009  . Anginal pain   . Pneumonia ~ 2012  . Shortness of breath     "lying down & w/exertion" (03/04/2012)  . Type II diabetes mellitus   . Hep B w/o coma 1970's    "I was in [place]" (03/04/2012)  . Chronic lower back pain   . PTSD (post-traumatic stress disorder)     Past Surgical History  Procedure Date  . Esophagogastroduodenoscopy 09/22/2011    Procedure: ESOPHAGOGASTRODUODENOSCOPY (EGD);  Surgeon: Charna Elizabeth, MD;  Location: [facility] ENDOSCOPY;  Service: Endoscopy;  Laterality: N/A;  . Esophagogastroduodenoscopy 01/05/2012    Procedure: ESOPHAGOGASTRODUODENOSCOPY (EGD);  Surgeon: Rachael Fee, MD;  Location: [facility] ENDOSCOPY;   Service: Endoscopy;  Laterality: N/A;  . Cholecystectomy ~ 2009  . Spinal growth rods ~ 2011  . Tonsillectomy and adenoidectomy   . Coronary angioplasty with stent placement ~ 2009`    "1 + ! (after 1st one failed)" (03/04/2012)    Social History:  reports that he has been smoking Cigarettes.  He has a 19 pack-year smoking history. He has never used smokeless tobacco. He reports that he drinks alcohol. He reports that he uses illicit drugs (Marijuana).  Allergies:  Allergies  Allergen Reactions  . Lisinopril Swelling and Rash  . Morphine And Related Swelling and Rash    Medications Prior to Admission  Medication Sig Dispense Refill  . ALPRAZolam (XANAX) 1 MG tablet Take 1 tablet (1 mg total) by mouth 2 (two) times daily.  4 tablet  0  . aspirin EC 81 MG tablet Take 81 mg by mouth daily.      . DULoxetine (CYMBALTA) 30 MG capsule Take 1 capsule (30 mg total) by mouth daily.      Marland Kitchen gabapentin (NEURONTIN) 400 MG capsule Take 400 mg by mouth 3 (three) times daily.      Marland Kitchen glipiZIDE (GLUCOTROL XL) 10 MG 24 hr tablet Take 10 mg by mouth 2 (two) times daily.      . insulin lispro protamine-insulin lispro (HUMALOG 75/25) (75-25) 100 UNIT/ML SUSP Inject 12 Units into the skin 2 (two) times daily with a  meal.      . lactulose (CHRONULAC) 10 GM/15ML solution Take 20 g by mouth 2 (two) times daily.      Marland Kitchen losartan (COZAAR) 100 MG tablet Take 100 mg by mouth daily.      . metFORMIN (GLUCOPHAGE) 850 MG tablet Take 850 mg by mouth 3 (three) times daily.      . metoCLOPramide (REGLAN) 10 MG tablet Take 10 mg by mouth every 6 (six) hours as needed. For nausea      . ondansetron (ZOFRAN) 4 MG tablet Take 4 mg by mouth every 6 (six) hours as needed. For nausea      . oxycodone (ROXICODONE) 30 MG immediate release tablet Take 1 tablet (30 mg total) by mouth every 4 (four) hours as needed. For breakthrough pain  12 tablet  0  . Oxycodone HCl (OXYCONTIN) 60 MG TB12 Take 60 mg by mouth every 12 (twelve) hours.       . pantoprazole (PROTONIX) 40 MG tablet Take 1 tablet (40 mg total) by mouth daily.  30 tablet  0  . promethazine (PHENERGAN) 25 MG tablet Take 25 mg by mouth every 6 (six) hours as needed. For nausea      . Ranitidine HCl (ZANTAC PO) Take 300 mg by mouth 2 (two) times daily.       . sennosides-docusate sodium (SENOKOT-S) 8.6-50 MG tablet Take 1 tablet by mouth 2 (two) times daily as needed. For constipation        Blood pressure 114/63, pulse 66, temperature 97.5 F (36.4 C), temperature source Oral, resp. rate 16, height 6\' 4"  (1.93 m), weight 322 lb 1.5 oz (146.1 kg), SpO2 96.00%.  Physical Exam: Gen:  NAD HEENT: Moist mucous membranes  CV: Regular rate and rhythm, no murmurs rubs or gallops PULM: Clear to auscultation bilaterally. No wheezes/rales/rhonchi ABD: Obese, soft, tenderness diffusely but mostly reported on LLQ. No guarding, no rebound tenderness. Normal bowel sounds EXT: No edema Neuro: Alert and oriented x 3. No focalization  Pshyc: Splitting behavior. Appears to be asleep but he answers questions appropriately. Flat affect.    Results for orders placed during the hospital encounter of 03/04/12 (from the past 48 hour(s))  CBC WITH DIFFERENTIAL     Status: Abnormal   Collection Time   03/04/12  9:22 AM      Component Value Range Comment   WBC 10.4  4.0 - 10.5 K/uL    RBC 5.08  4.22 - 5.81 MIL/uL    Hemoglobin 16.4  13.0 - 17.0 g/dL    HCT 16.1  09.6 - 04.5 %    MCV 94.1  78.0 - 100.0 fL    MCH 32.3  26.0 - 34.0 pg    MCHC 34.3  30.0 - 36.0 g/dL    RDW 40.9  81.1 - 91.4 %    Platelets 252  150 - 400 K/uL    Neutrophils Relative 72  43 - 77 %    Neutro Abs 7.4  1.7 - 7.7 K/uL    Lymphocytes Relative 18  12 - 46 %    Lymphs Abs 1.8  0.7 - 4.0 K/uL    Monocytes Relative 11  3 - 12 %    Monocytes Absolute 1.1 (*) 0.1 - 1.0 K/uL    Eosinophils Relative 0  0 - 5 %    Eosinophils Absolute 0.0  0.0 - 0.7 K/uL    Basophils Relative 0  0 - 1 %    Basophils Absolute  0.0  0.0 - 0.1 K/uL   COMPREHENSIVE METABOLIC PANEL     Status: Abnormal   Collection Time   03/04/12  9:22 AM      Component Value Range Comment   Sodium 137  135 - 145 mEq/L    Potassium 3.4 (*) 3.5 - 5.1 mEq/L    Chloride 96  96 - 112 mEq/L    CO2 27  19 - 32 mEq/L    Glucose, Bld 331 (*) 70 - 99 mg/dL    BUN 10  6 - 23 mg/dL    Creatinine, Ser 4.09  0.50 - 1.35 mg/dL    Calcium 81.1  8.4 - 10.5 mg/dL    Total Protein 8.2  6.0 - 8.3 g/dL    Albumin 3.9  3.5 - 5.2 g/dL    AST 9  0 - 37 U/L    ALT 7  0 - 53 U/L    Alkaline Phosphatase 91  39 - 117 U/L    Total Bilirubin 0.3  0.3 - 1.2 mg/dL    GFR calc non Af Amer >90  >90 mL/min    GFR calc Af Amer >90  >90 mL/min   LIPASE, BLOOD     Status: Normal   Collection Time   03/04/12  9:22 AM      Component Value Range Comment   Lipase 22  11 - 59 U/L   TROPONIN I     Status: Normal   Collection Time   03/04/12  9:23 AM      Component Value Range Comment   Troponin I <0.30  <0.30 ng/mL   URINALYSIS, ROUTINE W REFLEX MICROSCOPIC     Status: Abnormal   Collection Time   03/04/12 11:46 AM      Component Value Range Comment   Color, Urine AMBER (*) YELLOW BIOCHEMICALS MAY BE AFFECTED BY COLOR   APPearance CLOUDY (*) CLEAR    Specific Gravity, Urine 1.039 (*) 1.005 - 1.030    pH 5.5  5.0 - 8.0    Glucose, UA 100 (*) NEGATIVE mg/dL    Hgb urine dipstick NEGATIVE  NEGATIVE    Bilirubin Urine SMALL (*) NEGATIVE    Ketones, ur 15 (*) NEGATIVE mg/dL    Protein, ur 914 (*) NEGATIVE mg/dL    Urobilinogen, UA 0.2  0.0 - 1.0 mg/dL    Nitrite NEGATIVE  NEGATIVE    Leukocytes, UA SMALL (*) NEGATIVE   URINE MICROSCOPIC-ADD ON     Status: Abnormal   Collection Time   03/04/12 11:46 AM      Component Value Range Comment   Squamous Epithelial / LPF RARE  RARE    WBC, UA 3-6  <3 WBC/hpf    RBC / HPF 0-2  <3 RBC/hpf    Bacteria, UA RARE  RARE    Crystals CA OXALATE CRYSTALS (*) NEGATIVE    Urine-Other MUCOUS PRESENT     CBC     Status:  Normal   Collection Time   03/04/12  5:47 PM      Component Value Range Comment   WBC 9.6  4.0 - 10.5 K/uL    RBC 5.09  4.22 - 5.81 MIL/uL    Hemoglobin 16.4  13.0 - 17.0 g/dL    HCT 78.2  95.6 - 21.3 %    MCV 95.7  78.0 - 100.0 fL    MCH 32.2  26.0 - 34.0 pg    MCHC 33.7  30.0 - 36.0 g/dL    RDW  14.1  11.5 - 15.5 %    Platelets 218  150 - 400 K/uL   CREATININE, SERUM     Status: Normal   Collection Time   03/04/12  5:47 PM      Component Value Range Comment   Creatinine, Ser 0.81  0.50 - 1.35 mg/dL    GFR calc non Af Amer >90  >90 mL/min    GFR calc Af Amer >90  >90 mL/min   GLUCOSE, CAPILLARY     Status: Abnormal   Collection Time   03/04/12  5:57 PM      Component Value Range Comment   Glucose-Capillary 268 (*) 70 - 99 mg/dL   GLUCOSE, CAPILLARY     Status: Abnormal   Collection Time   03/04/12 10:04 PM      Component Value Range Comment   Glucose-Capillary 303 (*) 70 - 99 mg/dL    Comment 1 Documented in Chart      Comment 2 Notify RN     GLUCOSE, CAPILLARY     Status: Abnormal   Collection Time   03/05/12  7:49 AM      Component Value Range Comment   Glucose-Capillary 149 (*) 70 - 99 mg/dL    Comment 1 Documented in Chart      Comment 2 Notify RN     BASIC METABOLIC PANEL     Status: Abnormal   Collection Time   03/05/12  8:16 AM      Component Value Range Comment   Sodium 137  135 - 145 mEq/L    Potassium 3.4 (*) 3.5 - 5.1 mEq/L    Chloride 99  96 - 112 mEq/L    CO2 29  19 - 32 mEq/L    Glucose, Bld 137 (*) 70 - 99 mg/dL    BUN 10  6 - 23 mg/dL    Creatinine, Ser 1.61  0.50 - 1.35 mg/dL    Calcium 9.1  8.4 - 09.6 mg/dL    GFR calc non Af Amer >90  >90 mL/min    GFR calc Af Amer >90  >90 mL/min   CBC     Status: Normal   Collection Time   03/05/12  8:16 AM      Component Value Range Comment   WBC 7.1  4.0 - 10.5 K/uL    RBC 4.69  4.22 - 5.81 MIL/uL    Hemoglobin 14.8  13.0 - 17.0 g/dL    HCT 04.5  40.9 - 81.1 %    MCV 95.3  78.0 - 100.0 fL    MCH 31.6  26.0  - 34.0 pg    MCHC 33.1  30.0 - 36.0 g/dL    RDW 91.4  78.2 - 95.6 %    Platelets 204  150 - 400 K/uL   MAGNESIUM     Status: Normal   Collection Time   03/05/12  8:16 AM      Component Value Range Comment   Magnesium 2.0  1.5 - 2.5 mg/dL   GLUCOSE, CAPILLARY     Status: Abnormal   Collection Time   03/05/12 11:45 AM      Component Value Range Comment   Glucose-Capillary 126 (*) 70 - 99 mg/dL    Comment 1 Documented in Chart      Comment 2 Notify RN      Dg Abd 1 View  03/05/2012  IMPRESSION: Dilatation of a loop of small intestine the lower left side of the abdomen.  Air-fluid levels cannot be evaluated without horizontal beam image.  Moderate fecal distention of portions of the colon. Prostatic calcifications are seen.  This may be associated with chronic prostatitis or previous trauma.   Original Report Authenticated By: Onalee Hua Call    Dg Abd Acute W/chest  03/04/2012  *RADIOLOGY REPORT*  IMPRESSION:  1.  Mildly dilated small bowel with air-fluid levels in large and small bowel.  Bowel gas pattern is abnormal but nonspecific and could be seen in the setting of ileus or early obstruction.   Original Report Authenticated By: Gaylyn Rong, M.D.    Assessment/Plan 61 y/o male admitted for reporting acute on chronic abdominal pain, nausea, vomiting, coffee ground diarrhea and fever. - Most likely viral gastroenteritis on the setting of his chronic problems. - Symptoms out of proportion with physical exam, laboratory and radiologic studies. XR reporting non-specific bowel gas pattern possible ileus or early obstruction, but patient is tolerating clears well, passing gas and has non toxic appearance.  - From surgical stand point, no surgical treatment is needed at this time.   PILOTO, Kae Lauman. PGY-2 Family Medicine Resident  03/05/2012, 3:14 PM [facility]  Pager: [phone]

## 2012-03-05 NOTE — Progress Notes (Signed)
[facility] PROGRESS NOTE  Ryan [name] ZOX:[id number] DOB: [birth year] DOA: 03/04/2012 PCP: Warrick Parisian, MD  Assessment/Plan:  #1 acute abdominal pain Questionable etiology likely secondary to ileus versus early small bowel obstruction per acute abdominal series obtained on admission. Patient is on chronic narcotic pain medications which may be the likely etiology. Patient still with abdominal pain this morning. Patient tolerating clears. Will repeat a KUB. Replete potassium to keep potassium greater than 4. Magnesium level is a 2. IV fluids, antibiotics, supportive care. We'll try to limit narcotic pain medications. Will consult with general surgery for further evaluation and management.  #2 hypokalemia Replete.  #3 diabetes mellitus CBGs have ranged from 149 - 303. Continue current 70/30. Sliding scale insulin. D/C glipizide.  #4 chronic back pain/narcotic dependence Continue current pain regimen. Will likely need to be weaned off narcotic pain medicines as outpatient.  #4 hypertension Continue Cozaar  #5 history of coronary artery disease/PCI. Stable. Continue aspirin and Cozaar.   #6 prophylaxis PPI for GI prophylaxis, Lovenox for DVT prophylaxis.  Code Status: Full Family Communication: Updated patient at bedside Disposition Plan: Home when medically stable   Consultants:  General surgery pending  Procedures:  Acute abdominal series 03/04/2012  Antibiotics:  None  HPI/Subjective: Patient c/o abdominal pain with nausea and no emesis since yesterday. Patient states scared to eat secondary to abdominal pain and emesis.  Objective: Filed Vitals:   03/04/12 1846 03/04/12 2102 03/05/12 0525 03/05/12 1026  BP:  143/83 109/73 133/82  Pulse:  72 72 62  Temp:  98.4 F (36.9 C) 98.1 F (36.7 C) 98.3 F (36.8 C)  TempSrc:  Oral Oral Oral  Resp:  16 12 15   Height: 6\' 4"  (1.93 m)     Weight: 146.1 kg (322 lb 1.5 oz)     SpO2:  93% 96%      Intake/Output Summary (Last 24 hours) at 03/05/12 1145 Last data filed at 03/05/12 0252  Gross per 24 hour  Intake    605 ml  Output    400 ml  Net    205 ml   Filed Weights   03/04/12 1846  Weight: 146.1 kg (322 lb 1.5 oz)    Exam:   General:  NAD  Cardiovascular: RRR. NO C/C/E  Respiratory: CTAB  Abdomen: Soft/ND/OBESE/TTP IN LLQ AND LUQ/+BS  Data Reviewed: Basic Metabolic Panel:  Lab 03/05/12 8119 03/04/12 1747 03/04/12 0922 03/01/12 0930  NA 137 -- 137 132*  K 3.4* -- 3.4* 3.8  CL 99 -- 96 96  CO2 29 -- 27 27  GLUCOSE 137* -- 331* 274*  BUN 10 -- 10 10  CREATININE 0.77 0.81 0.75 0.67  CALCIUM 9.1 -- 10.5 9.2  MG 2.0 -- -- --  PHOS -- -- -- --   Liver Function Tests:  Lab 03/04/12 0922 03/01/12 0930  AST 9 10  ALT 7 9  ALKPHOS 91 79  BILITOT 0.3 0.2*  PROT 8.2 7.0  ALBUMIN 3.9 3.3*    Lab 03/04/12 0922 03/01/12 0930  LIPASE 22 22  AMYLASE -- --   No results found for this basename: AMMONIA:5 in the last 168 hours CBC:  Lab 03/05/12 0816 03/04/12 1747 03/04/12 0922 03/01/12 1035 03/01/12 0930  WBC 7.1 9.6 10.4 9.0 9.9  NEUTROABS -- -- 7.4 6.1 SPECIMEN CLOTTED  HGB 14.8 16.4 16.4 13.0 14.0  HCT 44.7 48.7 47.8 39.9 42.0  MCV 95.3 95.7 94.1 95.0 95.2  PLT 204 218 252 210 SPECIMEN CLOTTED   Cardiac Enzymes:  Lab 03/04/12 0923  CKTOTAL --  CKMB --  CKMBINDEX --  TROPONINI <0.30   BNP (last 3 results) No results found for this basename: PROBNP:3 in the last 8760 hours CBG:  Lab 03/05/12 0749 03/04/12 2204 03/04/12 1757  GLUCAP 149* 303* 268*    Recent Results (from the past 240 hour(s))  URINE CULTURE     Status: Normal   Collection Time   03/01/12 12:22 PM      Component Value Range Status Comment   Specimen Description URINE, RANDOM   Final    Special Requests NONE   Final    Culture  Setup Time 03/01/2012 21:02   Final    Colony Count 3,000 COLONIES/ML   Final    Culture INSIGNIFICANT GROWTH   Final    Report Status  03/02/2012 FINAL   Final      Studies: Dg Abd Acute W/chest  03/04/2012  *RADIOLOGY REPORT*  Clinical Data: Left sided abdominal pain for 1 week; sob, n/v  Pt has been out of diabetes and HTN med for past week; cholecystectomy, no chest surgeries  ACUTE ABDOMEN SERIES (ABDOMEN 2 VIEW & CHEST 1 VIEW)  Comparison: 03/01/2012; 02/05/2012  Findings: Stable appearance of the lungs noted with indistinct bilateral interstitial accentuation.  Coronary stent noted.  No cardiomegaly.  Thoracic spondylosis is present.  Abnormal air-fluid levels are present in small and large bowel. Mild to moderately dilated loops of small bowel noted.  Prior lower lumbar posterolateral rod pedicle screw fixation noted. Cholecystectomy clips are present.  IMPRESSION:  1.  Mildly dilated small bowel with air-fluid levels in large and small bowel.  Bowel gas pattern is abnormal but nonspecific and could be seen in the setting of ileus or early obstruction.   Original Report Authenticated By: Gaylyn Rong, M.D.     Scheduled Meds:   . ALPRAZolam  1 mg Oral BID  . aspirin EC  81 mg Oral Daily  . DULoxetine  30 mg Oral Daily  . enoxaparin (LOVENOX) injection  40 mg Subcutaneous Q24H  . gabapentin  400 mg Oral TID  . glipiZIDE  10 mg Oral BID  . [COMPLETED]  HYDROmorphone (DILAUDID) injection  1 mg Intravenous Once  . [COMPLETED] influenza  inactive virus vaccine  0.5 mL Intramuscular Tomorrow-1000  . insulin aspart  0-15 Units Subcutaneous TID WC  . insulin aspart protamine-insulin aspart  8 Units Subcutaneous BID WC  . lactulose  20 g Oral BID  . losartan  100 mg Oral Daily  . OxyCODONE  60 mg Oral Q12H  . pantoprazole  40 mg Oral Daily  . [COMPLETED] pneumococcal 23 valent vaccine  0.5 mL Intramuscular Tomorrow-1000  . potassium chloride  10 mEq Intravenous Q1 Hr x 5  . [COMPLETED] sodium chloride  500 mL Intravenous Once  . [DISCONTINUED] insulin aspart  0-15 Units Subcutaneous TID WC  . [DISCONTINUED] insulin  lispro protamine-insulin lispro  8 Units Subcutaneous BID WC  . [DISCONTINUED] pantoprazole (PROTONIX) IV  40 mg Intravenous Q24H   Continuous Infusions:   . sodium chloride 125 mL (03/05/12 1144)    Principal Problem:  *Abdominal pain Active Problems:  DIABETES MELLITUS, TYPE II  HYPERTENSION  CORONARY ARTERY DISEASE  GERD  Hypokalemia  Narcotic dependence    Time spent: > 35 mins    [place]  [facility] Pager [phone]. If 8PM-8AM, please contact night-coverage at [email or web address], password [facility] 03/05/2012, 11:45 AM  LOS: 1 day

## 2012-03-06 DIAGNOSIS — K5909 Other constipation: Secondary | ICD-10-CM

## 2012-03-06 DIAGNOSIS — I2510 Atherosclerotic heart disease of native coronary artery without angina pectoris: Secondary | ICD-10-CM

## 2012-03-06 LAB — BASIC METABOLIC PANEL
Calcium: 8.8 mg/dL (ref 8.4–10.5)
GFR calc Af Amer: 87 mL/min — ABNORMAL LOW (ref >90)
GFR calc non Af Amer: 75 mL/min — ABNORMAL LOW (ref >90)
Potassium: 3.9 mEq/L (ref 3.5–5.1)
Sodium: 137 mEq/L (ref 135–145)

## 2012-03-06 LAB — CBC
MCH: 31 pg (ref 26.0–34.0)
Platelets: 183 10*3/uL (ref 150–400)
RBC: 4.64 MIL/uL (ref 4.22–5.81)
WBC: 6.3 10*3/uL (ref 4.0–10.5)

## 2012-03-06 LAB — GLUCOSE, CAPILLARY
Glucose-Capillary: 106 mg/dL — ABNORMAL HIGH (ref 70–99)
Glucose-Capillary: 181 mg/dL — ABNORMAL HIGH (ref 70–99)

## 2012-03-06 MED ORDER — PANTOPRAZOLE SODIUM 40 MG PO TBEC: 40.0000 mg | DELAYED_RELEASE_TABLET | Freq: Every day | ORAL | Status: DC

## 2012-03-06 MED ORDER — PROMETHAZINE HCL 25 MG PO TABS: 25.0000 mg | ORAL_TABLET | Freq: Four times a day (QID) | ORAL | Status: DC | PRN

## 2012-03-06 MED ORDER — SENNA-DOCUSATE SODIUM 8.6-50 MG PO TABS: 2.0000 | ORAL_TABLET | Freq: Every day | ORAL | Status: DC

## 2012-03-06 MED ORDER — OXYCODONE HCL 30 MG PO TABS: 30.0000 mg | ORAL_TABLET | ORAL | Status: DC | PRN

## 2012-03-06 MED ORDER — POLYETHYLENE GLYCOL 3350 17 G PO PACK
17.0000 g | PACK | Freq: Every day | ORAL | Status: DC
Start: 2012-03-06 — End: 2012-03-06
  Administered 2012-03-06: 17 g via ORAL
  Filled 2012-03-06: qty 1

## 2012-03-06 MED ORDER — GABAPENTIN 400 MG PO CAPS: 400.0000 mg | ORAL_CAPSULE | Freq: Three times a day (TID) | ORAL | Status: DC

## 2012-03-06 MED ORDER — OXYCODONE HCL 60 MG PO TB12: 60.0000 mg | ORAL_TABLET | Freq: Two times a day (BID) | ORAL | Status: DC

## 2012-03-06 NOTE — Progress Notes (Signed)
Patient discharge teaching given, including activity, diet, follow-up appoints, and medications. Patient verbalized understanding of all discharge instructions. IV access was d/c'd. Vitals are stable. Skin is intact except as charted in most recent assessments. Pt to be escorted out by NT, to be driven home by friend. 

## 2012-03-06 NOTE — Progress Notes (Signed)
  Subjective: Still with LLQ abdominal discomfort and mild nausea, no vomiting. Reports normal color diarrheas yesterday (not documented), + flatus. Afebrile.   Objective: Vital signs in last 24 hours: Temp:  [97.5 F (36.4 C)-98.3 F (36.8 C)] 97.7 F (36.5 C) (11/08 0500) Pulse Rate:  [62-73] 73  (11/08 0500) Resp:  [15-18] 18  (11/08 0500) BP: (114-145)/(63-89) 132/81 mmHg (11/08 0500) SpO2:  [91 %-96 %] 91 % (11/08 0500) Last BM Date: 03/05/12  Intake/Output from previous day: 11/07 0701 - 11/08 0700 In: 3458.3 [P.O.:840; I.V.:2468.3; IV Piggyback:150] Out: 275 [Urine:275]    PE: Abd: Obese, soft. No tender to superficial palpation. Deep palpation uncomfortable on LLQ. No guarding. No rebound.   Lab Results:   Basename 03/06/12 0525 03/05/12 0816  WBC 6.3 7.1  HGB 14.4 14.8  HCT 44.6 44.7  PLT 183 204   BMET  Basename 03/06/12 0525 03/05/12 0816  NA 137 137  K 3.9 3.4*  CL 101 99  CO2 26 29  GLUCOSE 93 137*  BUN 9 10  CREATININE 1.05 0.77  CALCIUM 8.8 9.1   Studies/Results: Dg Abd 1 View 03/05/2012  IMPRESSION: Dilatation of a loop of small intestine the lower left side of the abdomen.  Air-fluid levels cannot be evaluated without horizontal beam image.  Moderate fecal distention of portions of the colon. Prostatic calcifications are seen.  This may be associated with chronic prostatitis or previous trauma.   Original Report Authenticated By: Onalee Hua Call    Dg Abd Acute W/chest 03/04/2012 IMPRESSION:  1.  Mildly dilated small bowel with air-fluid levels in large and small bowel.  Bowel gas pattern is abnormal but nonspecific and could be seen in the setting of ileus or early obstruction.   Original Report Authenticated By: Gaylyn Rong, M.D.    Assessment/Plan 61 y/o M with Acute on Chronic abdominal pain, nausea and diarrhea. - New XRay today positive for dilatation of a loop of small intestine on lower left side of the abdomen. Pt reports diarrhea and no  vomiting. Physical exam is negative for acute abdomen. Even today the tenderness is only on LLQ on deep palpation (inproved from yesterday) - Will discuss with surgery team.    LOS: 2 days    Ryan [name], Ryan [name] 03/06/2012, 9:39 AM  ADDENDUM: Patient is on a regular diet.  He has chronic abdominal pain.  His x-rays do show a loop in the LLQ that is dilated, but he has tons of air and stool in his colon.  He had a BM this morning per him.  He admits to mild nausea, but didn't want to eat his breakfast mostly because of a decrease in appetite.  Suspect this is mostly chronic in nature.  He needs to be on a bowel regimen.  No surgical intervention required.  Will sign off.  Please call back as needed.  Ryan [name] 10:08 AM 03/06/2012

## 2012-03-06 NOTE — Progress Notes (Signed)
Patient ID: Ryan [name], male   DOB: 11-28-1950, 61 y.o.   MRN: [id number]  I have seen and examined the patient and agree with the assessment and plans.  Will sign off  Janylah Belgrave A. Magnus Ivan  MD, FACS

## 2012-03-06 NOTE — Discharge Summary (Signed)
PATIENT DETAILS Name: Ryan [name] Age: 61 y.o. Sex: male Date of Birth: June 24, 1950 MRN: [id number]. Admit Date: 03/04/2012 Admitting Physician: Zannie Cove, MD WUJ:[id number],NFAOZH, MD  Recommendations for Outpatient Follow-up:  1. Taper/Wean off narcotics.  PRIMARY DISCHARGE DIAGNOSIS:  Principal Problem:  *Abdominal pain Active Problems:  DIABETES MELLITUS, TYPE II  HYPERTENSION  CORONARY ARTERY DISEASE  GERD  Hypokalemia  Narcotic dependence      PAST MEDICAL HISTORY: Past Medical History  Diagnosis Date  . Hypertension   . DVT (deep venous thrombosis) ~ 2011    BLE  . 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 Hyperlipidemia   . CAD (coronary atherosclerotic disease)     s/p stents x2  . Small bowel obstruction   . Pancreatitis   . Anxiety   . GERD (gastroesophageal reflux disease)   . CHF (congestive heart failure)   . Myocardial infarction ~ 2009  . Anginal pain   . Pneumonia ~ 2012  . Shortness of breath     "lying down & w/exertion" (03/04/2012)  . Type II diabetes mellitus   . Hep B w/o coma 1970's    "I was in [place]" (03/04/2012)  . Chronic lower back pain   . PTSD (post-traumatic stress disorder)     DISCHARGE MEDICATIONS:   Medication List     As of 03/06/2012  1:56 PM    TAKE these medications         ALPRAZolam 1 MG tablet   Commonly known as: XANAX   Take 1 tablet (1 mg total) by mouth 2 (two) times daily.      aspirin EC 81 MG tablet   Take 81 mg by mouth daily.      DULoxetine 30 MG capsule   Commonly known as: CYMBALTA   Take 1 capsule (30 mg total) by mouth daily.      gabapentin 400 MG capsule   Commonly known as: NEURONTIN   Take 1 capsule (400 mg total) by mouth 3 (three) times daily.      glipiZIDE 10 MG 24 hr tablet   Commonly known as: GLUCOTROL XL   Take 10 mg by mouth 2 (two) times daily.      insulin lispro protamine-insulin lispro (75-25) 100 UNIT/ML Susp   Commonly known as: HUMALOG  75/25   Inject 12 Units into the skin 2 (two) times daily with a meal.      lactulose 10 GM/15ML solution   Commonly known as: CHRONULAC   Take 20 g by mouth 2 (two) times daily.      losartan 100 MG tablet   Commonly known as: COZAAR   Take 100 mg by mouth daily.      metFORMIN 850 MG tablet   Commonly known as: GLUCOPHAGE   Take 850 mg by mouth 3 (three) times daily.      metoCLOPramide 10 MG tablet   Commonly known as: REGLAN   Take 10 mg by mouth every 6 (six) hours as needed. For nausea      ondansetron 4 MG tablet   Commonly known as: ZOFRAN   Take 4 mg by mouth every 6 (six) hours as needed. For nausea      Oxycodone HCl 60 MG Tb12   Take 1 tablet (60 mg total) by mouth every 12 (twelve) hours.      oxycodone 30 MG immediate release tablet   Commonly known as: ROXICODONE   Take 1 tablet (30 mg total)  by mouth every 4 (four) hours as needed. For breakthrough pain      pantoprazole 40 MG tablet   Commonly known as: PROTONIX   Take 1 tablet (40 mg total) by mouth daily.      promethazine 25 MG tablet   Commonly known as: PHENERGAN   Take 1 tablet (25 mg total) by mouth every 6 (six) hours as needed. For nausea      sennosides-docusate sodium 8.6-50 MG tablet   Commonly known as: SENOKOT-S   Take 2 tablets by mouth daily. For constipation      ZANTAC PO   Take 300 mg by mouth 2 (two) times daily.          BRIEF HPI:  See H&P, Labs, Consult and Test reports for all details in brief, 61 y/o male with multiple medical problems admitted for acute on chronic abdominal pain. He reports to me that he has been with intense LLQ abdominal pain accompanied by nausea, NB-NB vomiting for about a week and diarrhea for the past 3 days.   CONSULTATIONS:   [facility]  PERTINENT RADIOLOGIC STUDIES: Dg Abd 1 View  03/05/2012  *RADIOLOGY REPORT*  Clinical Data: History of left lower quadrant abdominal pain. Several day history of nausea, vomiting, and diarrhea.  History of  diabetes.  Post cholecystectomy.  ABDOMEN - 1 VIEW  Comparison: 03/04/2012.  Findings: There is moderate dilatation of a loop of small intestine in the lower left abdomen. Colon gas is present.  There is fecal distention of portions of the colon.  Cholecystectomy clips are seen.  No opaque calculi are seen.  Previous spinal surgery has been performed.  Posterior rod and pedicle screw fusion fixation has been performed at the level of L4-L5.  No disruption of hardware is seen.  Multiple prostatic calcifications are present.  IMPRESSION: Dilatation of a loop of small intestine the lower left side of the abdomen.  Air-fluid levels cannot be evaluated without horizontal beam image.  Moderate fecal distention of portions of the colon. Prostatic calcifications are seen.  This may be associated with chronic prostatitis or previous trauma.   Original Report Authenticated By: Onalee Hua Call    Ct Abdomen Pelvis W Contrast  03/01/2012  *RADIOLOGY REPORT*  Clinical Data: Left lower quadrant abdominal pain.  CT ABDOMEN AND PELVIS WITH CONTRAST  Technique:  Multidetector CT imaging of the abdomen and pelvis was performed following the standard protocol during bolus administration of intravenous contrast.  Contrast: OMNIPAQUE IOHEXOL 300 MG/ML  SOLN  Comparison: numerous prior CT scans dating back to 2009.  Findings: The lung bases demonstrate patchy areas of atelectasis and mild peribronchial thickening.  Patchy ground-glass opacities could reflect mild edema.  No focal infiltrates or effusion.  The heart is normal in size.  No pericardial effusion.  The liver is unremarkable and stable.  No worrisome hepatic lesions.  There is stable mild intrahepatic biliary dilatation likely due to prior cholecystectomy.  This is a stable finding. The common bile duct is stable in caliber.  The pancreatic duct is slightly prominent but also unchanged.  The pancreas is normal. The spleen is normal in size.  No focal lesions.  The adrenal  glands and kidneys are stable.  There is a stable upper pole right renal cyst and a stable lower pole hyperdense/hemorrhagic cyst.  The stomach is not well distended with contrast but no gross abnormalities are seen.  The duodenum, small bowel and colon are unremarkable.  No inflammatory changes or mass lesions.  There are sigmoid diverticuli but no findings for acute diverticulitis.  The aorta is normal in caliber.  The major branch vessels are patent.  There are stable borderline enlarged retroperitoneal lymph nodes.  The bony structures are intact.  Stable surgical changes noted in the lumbar spine.  Both  The bladder, prostate gland and seminal vesicles are unremarkable. Prostate calcifications are noted.  No pelvic mass, adenopathy or free pelvic fluid collections.  No inguinal mass or hernia. Scattered inguinal lymph nodes are noted.  IMPRESSION:  1.  Bibasilar atelectasis and probable edema. 2.  Stable mild biliary dilatation. 3.  Stable renal lesions. 4.  No acute abdominal/pelvic findings, mass lesions or new adenopathy.  Stable borderline retroperitoneal lymph nodes.   Original Report Authenticated By: Rudie Meyer, M.D.    Dg Abd Acute W/chest  03/04/2012  *RADIOLOGY REPORT*  Clinical Data: Left sided abdominal pain for 1 week; sob, n/v  Pt has been out of diabetes and HTN med for past week; cholecystectomy, no chest surgeries  ACUTE ABDOMEN SERIES (ABDOMEN 2 VIEW & CHEST 1 VIEW)  Comparison: 03/01/2012; 02/05/2012  Findings: Stable appearance of the lungs noted with indistinct bilateral interstitial accentuation.  Coronary stent noted.  No cardiomegaly.  Thoracic spondylosis is present.  Abnormal air-fluid levels are present in small and large bowel. Mild to moderately dilated loops of small bowel noted.  Prior lower lumbar posterolateral rod pedicle screw fixation noted. Cholecystectomy clips are present.  IMPRESSION:  1.  Mildly dilated small bowel with air-fluid levels in large and small bowel.   Bowel gas pattern is abnormal but nonspecific and could be seen in the setting of ileus or early obstruction.   Original Report Authenticated By: Gaylyn Rong, M.D.    Dg Abd Acute W/chest  02/05/2012  *RADIOLOGY REPORT*  Clinical Data: Vomiting, pain and diarrhea.  ACUTE ABDOMEN SERIES (ABDOMEN 2 VIEW & CHEST 1 VIEW)  Comparison: 01/29/2012.  Findings: Frontal view of the chest shows midline trachea and normal heart size.  Mild perihilar air space opacities, unchanged. No pleural fluid.  Two views of the abdomen show gas and stool scattered in minimally prominent colon.  There may be a few associated air fluid levels. There may be a single loop of mildly dilated small bowel in the left abdomen.  Postop changes in the spine.  IMPRESSION:  1.  Bowel gas pattern is nonspecific.  No evidence of overt obstruction. 2.  Perihilar air space opacities may be chronic.   Original Report Authenticated By: Reyes Ivan, M.D.      PERTINENT LAB RESULTS: CBC:  Basename 03/06/12 0525 03/05/12 0816  WBC 6.3 7.1  HGB 14.4 14.8  HCT 44.6 44.7  PLT 183 204   CMET CMP     Component Value Date/Time   NA 137 03/06/2012 0525   K 3.9 03/06/2012 0525   CL 101 03/06/2012 0525   CO2 26 03/06/2012 0525   GLUCOSE 93 03/06/2012 0525   BUN 9 03/06/2012 0525   CREATININE 1.05 03/06/2012 0525   CALCIUM 8.8 03/06/2012 0525   PROT 8.2 03/04/2012 0922   ALBUMIN 3.9 03/04/2012 0922   AST 9 03/04/2012 0922   ALT 7 03/04/2012 0922   ALKPHOS 91 03/04/2012 0922   BILITOT 0.3 03/04/2012 0922   GFRNONAA 75* 03/06/2012 0525   GFRAA 87* 03/06/2012 0525    GFR Estimated Creatinine Clearance: 116.9 ml/min (by C-G formula based on Cr of 1.05).  Basename 03/04/12 0922  LIPASE 22  AMYLASE --  Basename 03/04/12 0923  CKTOTAL --  CKMB --  CKMBINDEX --  TROPONINI <0.30   No components found with this basename: POCBNP:3 No results found for this basename: DDIMER:2 in the last 72 hours No results found for this basename:  HGBA1C:2 in the last 72 hours No results found for this basename: CHOL:2,HDL:2,LDLCALC:2,TRIG:2,CHOLHDL:2,LDLDIRECT:2 in the last 72 hours No results found for this basename: TSH,T4TOTAL,FREET3,T3FREE,THYROIDAB in the last 72 hours No results found for this basename: VITAMINB12:2,FOLATE:2,FERRITIN:2,TIBC:2,IRON:2,RETICCTPCT:2 in the last 72 hours Coags: No results found for this basename: PT:2,INR:2 in the last 72 hours Microbiology: Recent Results (from the past 240 hour(s))  URINE CULTURE     Status: Normal   Collection Time   03/01/12 12:22 PM      Component Value Range Status Comment   Specimen Description URINE, RANDOM   Final    Special Requests NONE   Final    Culture  Setup Time 03/01/2012 21:02   Final    Colony Count 3,000 COLONIES/ML   Final    Culture INSIGNIFICANT GROWTH   Final    Report Status 03/02/2012 FINAL   Final      BRIEF HOSPITAL COURSE:   Principal Problem:  *Acute on chronic Abdominal pain -Patient has a long-standing history of chronic abdominal pain for which he is on chronic narcotic therapy. He has had multiple repeated hospitalizations for the above reasons and has undergone extensive testing including EGDs and numerous CT scans of the abdomen. -On presentation he claims that he had worsening of his abdominal pain along with nausea vomiting. CT scan of the abdomen was also repeated during this admission which did not show any acute abnormalities. He was briefly kept n.p.o. and given a liquid diet. Since plain films of his abdomen suggested a small bowel obstruction surgery was consulted. During my rounds today, he is actually eating his lunch. His last bowel movement was yesterday. He has already tolerated a full liquid diet and has no further vomiting. His abdominal pain is back to his usual baseline. He will be discharged home today. He is asking for narcotics on discharge, he claims that he takes narcotics almost every day and he is worried about withdrawal. I  will give him narcotics for a few days till he sees his primary care practitioner. He claims that he has a followup appointment with [facility] coming up in the next few weeks, I've encouraged him to this appointment so that he can undergo further workup if needed to determine the etiology of his chronic abdominal pain  Active Problems: Hypokalemia -Secondary to GI loss, this was repleted.  Diabetes mellitus -Continue his usual medications on discharge. Last A1c on 10/19/2011 was 11.8. Further optimization of his diabetic regimen will be deferred to the outpatient setting.  Hypertension -Continue with his usual antihypertensive medications on discharge. This is relatively well-controlled during his hospital stay.  Constipation -He is on a bowel regimen, please see the above list of discharge medications.   CORONARY ARTERY DISEASE -Continued asked   GERD -Continue with PPI   Narcotic dependence -Patient does exhibit some narcotic seeking behavior, however he has a long history of chronic narcotic use, he would need to be slowly tapered off these narcotic medications by his primary care practitioner. Abrupt cessation may cause withdrawal symptoms.   TODAY-DAY OF DISCHARGE:  Subjective:   Tiernan Saade today has no headache,no new weakness tingling or numbness, feels much better. He has tolerated diet and has had a bowel movement  Objective:  Blood pressure 132/81, pulse 73, temperature 97.7 F (36.5 C), temperature source Oral, resp. rate 18, height 6\' 4"  (1.93 m), weight 146.1 kg (322 lb 1.5 oz), SpO2 91.00%.  Intake/Output Summary (Last 24 hours) at 03/06/12 1356 Last data filed at 03/06/12 0530  Gross per 24 hour  Intake 2518.33 ml  Output      0 ml  Net 2518.33 ml    Exam Awake Alert, Oriented *3, No new F.N deficits, Normal affect [place].AT,PERRAL Supple Neck,No JVD, No cervical lymphadenopathy appriciated.  Symmetrical Chest wall movement, Good air movement  bilaterally, CTAB RRR,No Gallops,Rubs or new murmurs, No Parasternal Heave +ve B.Sounds, Abd Soft, Mildly tender in the LLQ, No organomegaly appriciated, No rebound -guarding or rigidity. No Cyanosis, Clubbing or edema, No new Rash or bruise  DISCHARGE CONDITION: Stable  DISPOSITION: HOME  DISCHARGE INSTRUCTIONS:    Activity:  As tolerated   Diet recommendation: Diabetic Diet Heart Healthy diet  Follow-up Information    Follow up with STALLINGS,SHEILA, MD. Schedule an appointment as soon as possible for a visit in 1 week.   Contact information:   [address] [place] [address] [phone]       Follow up with Yancey Flemings, MD. (Keep scheduled appointment)    Contact information:   [address] [place] [address] [phone]         Total Time spent on discharge equals 45 minutes.  SignedJeoffrey Massed 03/06/2012 1:56 PM

## 2012-03-06 NOTE — H&P (Signed)
Patient with history of esophagitis.  No strong evidence of bowel obstruction or other surgical emergency at this time.  Diarrhea ports more to gastroenteritis or other etiology.  Consider Gastroenterology evaluation if worsens   Recommend liquids and advance diet gradually.  Bowel regimen as toleratedContinue medical support  We will follow, but I doubt that this is a acute surgical issue.

## 2012-03-28 ENCOUNTER — Emergency Department (HOSPITAL_COMMUNITY)
Admission: EM | Admit: 2012-03-28 | Discharge: 2012-03-28 | Disposition: A | Discharge status: Home / Self Care | Payer: Medicare Other | Attending: Emergency Medicine | Admitting: Emergency Medicine

## 2012-03-28 ENCOUNTER — Emergency Department (HOSPITAL_COMMUNITY): Payer: Medicare Other

## 2012-03-28 ENCOUNTER — Encounter (HOSPITAL_COMMUNITY): Payer: Self-pay | Admitting: Emergency Medicine

## 2012-03-28 DIAGNOSIS — R197 Diarrhea, unspecified: Secondary | ICD-10-CM | POA: Insufficient documentation

## 2012-03-28 DIAGNOSIS — G8929 Other chronic pain: Secondary | ICD-10-CM | POA: Insufficient documentation

## 2012-03-28 DIAGNOSIS — I252 Old myocardial infarction: Secondary | ICD-10-CM | POA: Insufficient documentation

## 2012-03-28 DIAGNOSIS — I10 Essential (primary) hypertension: Secondary | ICD-10-CM | POA: Insufficient documentation

## 2012-03-28 DIAGNOSIS — Z8619 Personal history of other infectious and parasitic diseases: Secondary | ICD-10-CM | POA: Insufficient documentation

## 2012-03-28 DIAGNOSIS — R112 Nausea with vomiting, unspecified: Secondary | ICD-10-CM | POA: Insufficient documentation

## 2012-03-28 DIAGNOSIS — F411 Generalized anxiety disorder: Secondary | ICD-10-CM | POA: Insufficient documentation

## 2012-03-28 DIAGNOSIS — F17200 Nicotine dependence, unspecified, uncomplicated: Secondary | ICD-10-CM | POA: Insufficient documentation

## 2012-03-28 DIAGNOSIS — K219 Gastro-esophageal reflux disease without esophagitis: Secondary | ICD-10-CM | POA: Insufficient documentation

## 2012-03-28 DIAGNOSIS — Z9861 Coronary angioplasty status: Secondary | ICD-10-CM | POA: Insufficient documentation

## 2012-03-28 DIAGNOSIS — I82409 Acute embolism and thrombosis of unspecified deep veins of unspecified lower extremity: Secondary | ICD-10-CM | POA: Insufficient documentation

## 2012-03-28 DIAGNOSIS — Z8701 Personal history of pneumonia (recurrent): Secondary | ICD-10-CM | POA: Insufficient documentation

## 2012-03-28 DIAGNOSIS — Z8659 Personal history of other mental and behavioral disorders: Secondary | ICD-10-CM | POA: Insufficient documentation

## 2012-03-28 DIAGNOSIS — Z79899 Other long term (current) drug therapy: Secondary | ICD-10-CM | POA: Insufficient documentation

## 2012-03-28 DIAGNOSIS — I509 Heart failure, unspecified: Secondary | ICD-10-CM | POA: Insufficient documentation

## 2012-03-28 DIAGNOSIS — Z794 Long term (current) use of insulin: Secondary | ICD-10-CM | POA: Insufficient documentation

## 2012-03-28 DIAGNOSIS — M545 Low back pain, unspecified: Secondary | ICD-10-CM | POA: Insufficient documentation

## 2012-03-28 DIAGNOSIS — R109 Unspecified abdominal pain: Secondary | ICD-10-CM

## 2012-03-28 DIAGNOSIS — E785 Hyperlipidemia, unspecified: Secondary | ICD-10-CM | POA: Insufficient documentation

## 2012-03-28 DIAGNOSIS — R42 Dizziness and giddiness: Secondary | ICD-10-CM | POA: Insufficient documentation

## 2012-03-28 DIAGNOSIS — I2510 Atherosclerotic heart disease of native coronary artery without angina pectoris: Secondary | ICD-10-CM | POA: Insufficient documentation

## 2012-03-28 DIAGNOSIS — Z8719 Personal history of other diseases of the digestive system: Secondary | ICD-10-CM | POA: Insufficient documentation

## 2012-03-28 DIAGNOSIS — Z7982 Long term (current) use of aspirin: Secondary | ICD-10-CM | POA: Insufficient documentation

## 2012-03-28 DIAGNOSIS — E119 Type 2 diabetes mellitus without complications: Secondary | ICD-10-CM | POA: Insufficient documentation

## 2012-03-28 LAB — COMPREHENSIVE METABOLIC PANEL
Albumin: 3.8 g/dL (ref 3.5–5.2)
Alkaline Phosphatase: 85 U/L (ref 39–117)
BUN: 9 mg/dL (ref 6–23)
Calcium: 10 mg/dL (ref 8.4–10.5)
Creatinine, Ser: 0.73 mg/dL (ref 0.50–1.35)
GFR calc Af Amer: >90 mL/min (ref >90)
Glucose, Bld: 335 mg/dL — ABNORMAL HIGH (ref 70–99)
Potassium: 4.1 mEq/L (ref 3.5–5.1)
Total Protein: 8.1 g/dL (ref 6.0–8.3)

## 2012-03-28 LAB — CBC WITH DIFFERENTIAL/PLATELET
Basophils Relative: 0 % (ref 0–1)
Eosinophils Absolute: 0 10*3/uL (ref 0.0–0.7)
Eosinophils Relative: 0 % (ref 0–5)
HCT: 46.9 % (ref 39.0–52.0)
Hemoglobin: 15.8 g/dL (ref 13.0–17.0)
Lymphs Abs: 1.8 10*3/uL (ref 0.7–4.0)
MCH: 31.4 pg (ref 26.0–34.0)
MCHC: 33.7 g/dL (ref 30.0–36.0)
MCV: 93.2 fL (ref 78.0–100.0)
Monocytes Absolute: 0.4 10*3/uL (ref 0.1–1.0)
Monocytes Relative: 6 % (ref 3–12)
Neutrophils Relative %: 70 % (ref 43–77)
RBC: 5.03 MIL/uL (ref 4.22–5.81)

## 2012-03-28 LAB — LIPASE, BLOOD: Lipase: 24 U/L (ref 11–59)

## 2012-03-28 MED ORDER — METRONIDAZOLE 500 MG PO TABS: 500.0000 mg | ORAL_TABLET | Freq: Three times a day (TID) | ORAL | Status: DC

## 2012-03-28 MED ORDER — ONDANSETRON HCL 4 MG/2ML IJ SOLN
4.0000 mg | Freq: Once | INTRAMUSCULAR | Status: AC
  Administered 2012-03-28: 4 mg via INTRAVENOUS
  Filled 2012-03-28: qty 2

## 2012-03-28 MED ORDER — SODIUM CHLORIDE 0.9 % IV BOLUS (SEPSIS)
1000.0000 mL | Freq: Once | INTRAVENOUS | Status: AC
  Administered 2012-03-28: 1000 mL via INTRAVENOUS

## 2012-03-28 MED ORDER — IOHEXOL 300 MG/ML  SOLN
100.0000 mL | Freq: Once | INTRAMUSCULAR | Status: AC | PRN
  Administered 2012-03-28: 100 mL via INTRAVENOUS

## 2012-03-28 MED ORDER — HYDROMORPHONE HCL PF 1 MG/ML IJ SOLN
1.0000 mg | Freq: Once | INTRAMUSCULAR | Status: AC
  Administered 2012-03-28: 1 mg via INTRAVENOUS
  Filled 2012-03-28: qty 1

## 2012-03-28 MED ORDER — ONDANSETRON HCL 4 MG PO TABS: 4.0000 mg | ORAL_TABLET | Freq: Four times a day (QID) | ORAL | Status: DC | PRN

## 2012-03-28 MED ORDER — HYDROMORPHONE HCL PF 1 MG/ML IJ SOLN
1.0000 mg | Freq: Once | INTRAMUSCULAR | Status: DC
  Filled 2012-03-28: qty 1

## 2012-03-28 MED ORDER — OXYCODONE-ACETAMINOPHEN 5-325 MG PO TABS: 1.0000 | ORAL_TABLET | ORAL | Status: DC | PRN

## 2012-03-28 MED ORDER — CIPROFLOXACIN HCL 500 MG PO TABS: 500.0000 mg | ORAL_TABLET | Freq: Two times a day (BID) | ORAL | Status: DC

## 2012-03-28 NOTE — ED Notes (Signed)
Pt discharged to home with family. NAD.  

## 2012-03-28 NOTE — ED Notes (Signed)
2 IV attempts. IV team called.

## 2012-03-28 NOTE — ED Notes (Signed)
Patient claims he's had LLQ pain x 4 days.   Patient claims that he is having sharp pain that radiates to back at time.  Patient claims when he drinks, eats or tries to do anything he gets nauseated and throws up.  Patient claims that he cannot keep medicines down.  Patient claims some diarrhea.  Patient claims that he has also been falling off and on lately with dizziness.

## 2012-03-28 NOTE — ED Provider Notes (Signed)
History     CSN: [id number]  Arrival date & time 03/28/12  1005   First MD Initiated Contact with Patient 03/28/12 1009      Chief Complaint  Patient presents with  . Abdominal Pain  . Dizziness  . Emesis    (Consider location/radiation/quality/duration/timing/severity/associated sxs/prior treatment) Patient is a 61 y.o. male presenting with abdominal pain and vomiting. The history is provided by the patient.  Abdominal Pain The primary symptoms of the illness include abdominal pain and vomiting.  Emesis  Associated symptoms include abdominal pain.  He has had left sided abdominal pain for the last 4 days. Pain is sharp with some radiation to the back. It has been getting worse. It is worse with eating and has temporary improvement after vomiting. There has been associated nausea and vomiting and has not been able to hold anything down including his medications. He has had some diarrhea, but diarrhea does not affect the pain. He denies fever, chills, sweats. He has not taken anything for the pain. Starting 2 days ago, he started having dizziness when he stands up. Dizziness is described as the room spinning around and he has fallen several times. He has no dizziness with head movement as long as he is laying down or sitting down. He has not had pain like this before. Pain is severe and rated at 10/10. He denies urinary symptoms.  Past Medical History  Diagnosis Date  . Hypertension   . DVT (deep venous thrombosis) ~ 2011    BLE  . 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 Hyperlipidemia   . CAD (coronary atherosclerotic disease)     s/p stents x2  . Small bowel obstruction   . Pancreatitis   . Anxiety   . GERD (gastroesophageal reflux disease)   . CHF (congestive heart failure)   . Myocardial infarction ~ 2009  . Anginal pain   . Pneumonia ~ 2012  . Shortness of breath     "lying down & w/exertion" (03/04/2012)  . Type II diabetes mellitus   . Hep B  w/o coma 1970's    "I was in [place]" (03/04/2012)  . Chronic lower back pain   . PTSD (post-traumatic stress disorder)     Past Surgical History  Procedure Date  . Esophagogastroduodenoscopy 09/22/2011    Procedure: ESOPHAGOGASTRODUODENOSCOPY (EGD);  Surgeon: Charna Elizabeth, MD;  Location: [facility] ENDOSCOPY;  Service: Endoscopy;  Laterality: N/A;  . Esophagogastroduodenoscopy 01/05/2012    Procedure: ESOPHAGOGASTRODUODENOSCOPY (EGD);  Surgeon: Rachael Fee, MD;  Location: [facility] ENDOSCOPY;  Service: Endoscopy;  Laterality: N/A;  . Cholecystectomy ~ 2009  . Spinal growth rods ~ 2011  . Tonsillectomy and adenoidectomy   . Coronary angioplasty with stent placement ~ 2009`    "1 + ! (after 1st one failed)" (03/04/2012)    Family History  Problem Relation Age of Onset  . Diabetes Father   . Colon cancer Neg Hx     History  Substance Use Topics  . Smoking status: Current Every Day Smoker -- 0.5 packs/day for 38 years    Types: Cigarettes  . Smokeless tobacco: Never Used  . 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  All other systems reviewed and are negative.    Allergies  Lisinopril and Morphine and related  Home Medications   Current Outpatient Rx  Name  Route  Sig  Dispense  Refill  . ALPRAZOLAM 1 MG PO TABS   Oral   Take 1 tablet (1 mg total) by mouth 2 (two) times daily.   4 tablet   0   . ASPIRIN EC 81 MG PO TBEC   Oral   Take 81 mg by mouth daily.         . DULOXETINE HCL 30 MG PO CPEP   Oral   Take 1 capsule (30 mg total) by mouth daily.         Marland Kitchen GABAPENTIN 400 MG PO CAPS   Oral   Take 1 capsule (400 mg total) by mouth 3 (three) times daily.   30 capsule   0   . GLIPIZIDE ER 10 MG PO TB24   Oral   Take 10 mg by mouth 2 (two) times daily.         . INSULIN LISPRO PROT & LISPRO  (75-25) 100 UNIT/ML [place] SUSP   Subcutaneous   Inject 12 Units into the skin 2 (two) times daily with a meal.         . LACTULOSE 10 GM/15ML PO SOLN   Oral   Take 20 g by mouth 2 (two) times daily.         Marland Kitchen LOSARTAN POTASSIUM 100 MG PO TABS   Oral   Take 100 mg by mouth daily.         Marland Kitchen METFORMIN HCL 850 MG PO TABS   Oral   Take 850 mg by mouth 3 (three) times daily.         Marland Kitchen METOCLOPRAMIDE HCL 10 MG PO TABS   Oral   Take 10 mg by mouth every 6 (six) hours as needed. For nausea         . ONDANSETRON HCL 4 MG PO TABS   Oral   Take 4 mg by mouth every 6 (six) hours as needed. For nausea         . OXYCODONE HCL 30 MG PO TABS   Oral   Take 1 tablet (30 mg total) by mouth every 4 (four) hours as needed. For breakthrough pain   12 tablet   0   . OXYCODONE HCL ER 60 MG PO TB12   Oral   Take 1 tablet (60 mg total) by mouth every 12 (twelve) hours.   10 tablet   0   . PANTOPRAZOLE SODIUM 40 MG PO TBEC   Oral   Take 1 tablet (40 mg total) by mouth daily.   30 tablet   0   . PROMETHAZINE HCL 25 MG PO TABS   Oral   Take 1 tablet (25 mg total) by mouth every 6 (six) hours as needed. For nausea   30 tablet   0   . ZANTAC PO   Oral   Take 300 mg by mouth 2 (two) times daily.          . SENNA-DOCUSATE SODIUM 8.6-50 MG PO TABS   Oral   Take 2 tablets by mouth daily. For constipation   60 tablet   0     BP 160/106  Pulse 85  Temp 98.1 F (36.7 C) (Oral)  Resp 20  Ht 6\' 4"  (1.93 m)  Wt 320 lb (145.151 kg)  BMI 38.95 kg/m2  SpO2 98%  Physical Exam  Nursing note and vitals reviewed.  60  year old male, resting comfortably and in no acute distress. Vital signs are significant for hypertension with blood pressure 160/106. Oxygen saturation is 98%, which is normal. Head is normocephalic and atraumatic. PERRLA, EOMI. Oropharynx is clear. Neck is nontender and supple without adenopathy or JVD. Back is nontender and there is no CVA tenderness. Lungs are  clear without rales, wheezes, or rhonchi. Chest is nontender. Heart has regular rate and rhythm without murmur. Abdomen is soft, with moderate to severe left upper quadrant tenderness. There is mild left CVA tenderness. There is no rebound or guarding. There are no masses or hepatosplenomegaly and peristalsis is hypoactive. Extremities have no cyanosis or edema, full range of motion is present. Skin is warm and dry without rash. Neurologic: Mental status is normal, cranial nerves are intact, there are no motor or sensory deficits. Dizziness is not reproduced by head movement.  ED Course  Procedures (including critical care time)  Results for orders placed during the hospital encounter of 03/28/12  CBC WITH DIFFERENTIAL      Component Value Range   WBC 7.6  4.0 - 10.5 K/uL   RBC 5.03  4.22 - 5.81 MIL/uL   Hemoglobin 15.8  13.0 - 17.0 g/dL   HCT 16.1  09.6 - 04.5 %   MCV 93.2  78.0 - 100.0 fL   MCH 31.4  26.0 - 34.0 pg   MCHC 33.7  30.0 - 36.0 g/dL   RDW 40.9  81.1 - 91.4 %   Platelets 239  150 - 400 K/uL   Neutrophils Relative 70  43 - 77 %   Neutro Abs 5.3  1.7 - 7.7 K/uL   Lymphocytes Relative 23  12 - 46 %   Lymphs Abs 1.8  0.7 - 4.0 K/uL   Monocytes Relative 6  3 - 12 %   Monocytes Absolute 0.4  0.1 - 1.0 K/uL   Eosinophils Relative 0  0 - 5 %   Eosinophils Absolute 0.0  0.0 - 0.7 K/uL   Basophils Relative 0  0 - 1 %   Basophils Absolute 0.0  0.0 - 0.1 K/uL  COMPREHENSIVE METABOLIC PANEL      Component Value Range   Sodium 135  135 - 145 mEq/L   Potassium 4.1  3.5 - 5.1 mEq/L   Chloride 99  96 - 112 mEq/L   CO2 24  19 - 32 mEq/L   Glucose, Bld 335 (*) 70 - 99 mg/dL   BUN 9  6 - 23 mg/dL   Creatinine, Ser 7.82  0.50 - 1.35 mg/dL   Calcium 95.6  8.4 - 21.3 mg/dL   Total Protein 8.1  6.0 - 8.3 g/dL   Albumin 3.8  3.5 - 5.2 g/dL   AST 9  0 - 37 U/L   ALT 8  0 - 53 U/L   Alkaline Phosphatase 85  39 - 117 U/L   Total Bilirubin 0.2 (*) 0.3 - 1.2 mg/dL   GFR calc non Af  Amer >90  >90 mL/min   GFR calc Af Amer >90  >90 mL/min  LIPASE, BLOOD      Component Value Range   Lipase 24  11 - 59 U/L     Ct Abdomen Pelvis W Contrast  03/28/2012  **ADDENDUM** CREATED: 03/28/2012 13:31:20  Sacroiliac joint sclerosis, left greater than right. Findings suggest sacroiliitis.  Question if the patient has sacroiliac joint pain.  **END ADDENDUM** SIGNED BY: Britta Mccreedy, M.D.   03/28/2012  *RADIOLOGY REPORT*  Clinical Data: Abdominal pain, left lower quadrant.  Dizziness and emesis.  CT ABDOMEN AND PELVIS WITH CONTRAST  Technique:  Multidetector CT imaging of the abdomen and pelvis was performed following the standard protocol during bolus administration of intravenous contrast.  Contrast: OMNIPAQUE IOHEXOL 300 MG/ML  SOLN  Comparison: CT abdomen pelvis 03/01/2012  Findings: Heart size is normal.  The imaged lung bases are clear. There is no pleural effusion.  The scout view of the abdomen pelvis demonstrates a nonobstructive bowel gas pattern.  The patient is status post cholecystectomy.  Minimal intrahepatic biliary ductal dilatation, likely related cholecystectomy is stable.  The liver is normal in size.  No focal hepatic lesions. The spleen, adrenal glands, and pancreas are within normal limits. Common bile duct is normal in caliber.  Stable upper pole right renal cyst measures approximate 18 mm.  Stable lower pole left renal cyst measures approximately 2.5 cm. Stable 6 mm probable cyst in the lateral lower pole of the left kidney.  There is no hydronephrosis.  The stomach appears within normal limits.  The small bowel loops and colon are normal in caliber.  No gastric wall thickening is identified.  There is diverticulosis of the colon, most prominent in the sigmoid region.  Normal appendix on images 55-58.  Urinary bladder is normal.  Normal sized prostate gland, containing central calcification.  The abdominal aorta is normal in caliber.  Umbilical hernia contains fat only.   Negative for ascites, free air, or abscess. The abdominal aorta is normal in caliber.  There are stable postoperative changes at the L4-L5, with posterior fusion and interbody spacer.  No hardware complication identified.  There is sclerosis about the sacroiliac joints bilaterally suggesting bilateral sacroiliitis.  IMPRESSION:  1.  Nonobstructive bowel gas pattern. No acute inflammatory changes or other acute abnormality identified in the abdomen or pelvis. 2.  Diverticulosis, most prominent in the sigmoid colon. 3.  Stable low density lesions in both kidneys, most likely cysts. 4.  Status post cholecystectomy. 5.  Normal appendix.  Original Report Authenticated By: Britta Mccreedy, M.D.      1. Abdominal pain   2. Nausea and vomiting       MDM  A pain of uncertain cause. Dyspnea seems to be more orthostatic than vestibular to be given IV hydration and IV narcotics and CT scan and laboratory workup have been ordered.  Workup is unremarkable. Patient continues to complain of pain and has required several doses of Dilaudid for pain. His exam continues to be benign. He is discharged with prescriptions for Percocet and ondansetron. He will be given antibiotics to treat possible occult diverticulitis and so is given prescriptions for ciprofloxacin and metronidazole and is to followup with his PCP in 2 days. He is advised to return should symptoms worsen.     Dione Booze, MD 03/28/12 [phone]

## 2012-03-28 NOTE — ED Notes (Signed)
PT to CT.

## 2012-03-28 NOTE — ED Notes (Signed)
Pt finished contrast, ct called.  

## 2012-03-28 NOTE — ED Notes (Signed)
IV team at bedside 

## 2012-04-02 ENCOUNTER — Encounter (HOSPITAL_COMMUNITY): Payer: Self-pay | Admitting: Emergency Medicine

## 2012-04-02 ENCOUNTER — Emergency Department (HOSPITAL_COMMUNITY)
Admission: EM | Admit: 2012-04-02 | Discharge: 2012-04-02 | Discharge status: Against Medical Advice | Payer: Medicare Other | Attending: Emergency Medicine | Admitting: Emergency Medicine

## 2012-04-02 DIAGNOSIS — R1110 Vomiting, unspecified: Secondary | ICD-10-CM | POA: Insufficient documentation

## 2012-04-02 LAB — CBC WITH DIFFERENTIAL/PLATELET
Basophils Absolute: 0 10*3/uL (ref 0.0–0.1)
Basophils Relative: 0 % (ref 0–1)
HCT: 45.3 % (ref 39.0–52.0)
Hemoglobin: 15.3 g/dL (ref 13.0–17.0)
Lymphocytes Relative: 26 % (ref 12–46)
MCHC: 33.8 g/dL (ref 30.0–36.0)
Monocytes Relative: 6 % (ref 3–12)
Neutro Abs: 5.3 10*3/uL (ref 1.7–7.7)
Neutrophils Relative %: 67 % (ref 43–77)
WBC: 8 10*3/uL (ref 4.0–10.5)

## 2012-04-02 LAB — HEPATIC FUNCTION PANEL
ALT: 7 U/L (ref 0–53)
AST: 10 U/L (ref 0–37)
Albumin: 3.7 g/dL (ref 3.5–5.2)
Alkaline Phosphatase: 81 U/L (ref 39–117)
Bilirubin, Direct: <0.1 mg/dL (ref 0.0–0.3)
Total Bilirubin: 0.3 mg/dL (ref 0.3–1.2)
Total Protein: 7.5 g/dL (ref 6.0–8.3)

## 2012-04-02 LAB — LIPASE, BLOOD: Lipase: 63 U/L — ABNORMAL HIGH (ref 11–59)

## 2012-04-02 LAB — BASIC METABOLIC PANEL
CO2: 23 mEq/L (ref 19–32)
Chloride: 98 mEq/L (ref 96–112)
GFR calc Af Amer: >90 mL/min (ref >90)
Potassium: 4 mEq/L (ref 3.5–5.1)

## 2012-04-02 NOTE — ED Notes (Signed)
Patient stated he could not wait any longer to be seen.

## 2012-04-02 NOTE — ED Notes (Signed)
Pt c/o generalized abd pain with N/V/D; pt sts chronic in nature but worse today; pt sts unable to keep down PO and saw some blood when vomiting

## 2012-04-22 ENCOUNTER — Emergency Department (HOSPITAL_COMMUNITY)
Admission: EM | Admit: 2012-04-22 | Discharge: 2012-04-22 | Disposition: A | Discharge status: Home / Self Care | Payer: Medicare Other | Attending: Emergency Medicine | Admitting: Emergency Medicine

## 2012-04-22 ENCOUNTER — Encounter (HOSPITAL_COMMUNITY): Payer: Self-pay | Admitting: Emergency Medicine

## 2012-04-22 ENCOUNTER — Emergency Department (HOSPITAL_COMMUNITY): Payer: Medicare Other

## 2012-04-22 DIAGNOSIS — Z794 Long term (current) use of insulin: Secondary | ICD-10-CM | POA: Insufficient documentation

## 2012-04-22 DIAGNOSIS — F411 Generalized anxiety disorder: Secondary | ICD-10-CM | POA: Insufficient documentation

## 2012-04-22 DIAGNOSIS — K219 Gastro-esophageal reflux disease without esophagitis: Secondary | ICD-10-CM | POA: Insufficient documentation

## 2012-04-22 DIAGNOSIS — Z7982 Long term (current) use of aspirin: Secondary | ICD-10-CM | POA: Insufficient documentation

## 2012-04-22 DIAGNOSIS — Z8679 Personal history of other diseases of the circulatory system: Secondary | ICD-10-CM | POA: Insufficient documentation

## 2012-04-22 DIAGNOSIS — R05 Cough: Secondary | ICD-10-CM | POA: Insufficient documentation

## 2012-04-22 DIAGNOSIS — Z79899 Other long term (current) drug therapy: Secondary | ICD-10-CM | POA: Insufficient documentation

## 2012-04-22 DIAGNOSIS — Z86718 Personal history of other venous thrombosis and embolism: Secondary | ICD-10-CM | POA: Insufficient documentation

## 2012-04-22 DIAGNOSIS — M545 Low back pain, unspecified: Secondary | ICD-10-CM | POA: Insufficient documentation

## 2012-04-22 DIAGNOSIS — Z8619 Personal history of other infectious and parasitic diseases: Secondary | ICD-10-CM | POA: Insufficient documentation

## 2012-04-22 DIAGNOSIS — I10 Essential (primary) hypertension: Secondary | ICD-10-CM | POA: Insufficient documentation

## 2012-04-22 DIAGNOSIS — R109 Unspecified abdominal pain: Secondary | ICD-10-CM

## 2012-04-22 DIAGNOSIS — Z8719 Personal history of other diseases of the digestive system: Secondary | ICD-10-CM | POA: Insufficient documentation

## 2012-04-22 DIAGNOSIS — E119 Type 2 diabetes mellitus without complications: Secondary | ICD-10-CM | POA: Insufficient documentation

## 2012-04-22 DIAGNOSIS — Z8701 Personal history of pneumonia (recurrent): Secondary | ICD-10-CM | POA: Insufficient documentation

## 2012-04-22 DIAGNOSIS — Z8659 Personal history of other mental and behavioral disorders: Secondary | ICD-10-CM | POA: Insufficient documentation

## 2012-04-22 DIAGNOSIS — I2510 Atherosclerotic heart disease of native coronary artery without angina pectoris: Secondary | ICD-10-CM | POA: Insufficient documentation

## 2012-04-22 DIAGNOSIS — I252 Old myocardial infarction: Secondary | ICD-10-CM | POA: Insufficient documentation

## 2012-04-22 DIAGNOSIS — Z8709 Personal history of other diseases of the respiratory system: Secondary | ICD-10-CM | POA: Insufficient documentation

## 2012-04-22 DIAGNOSIS — J40 Bronchitis, not specified as acute or chronic: Secondary | ICD-10-CM | POA: Insufficient documentation

## 2012-04-22 DIAGNOSIS — E785 Hyperlipidemia, unspecified: Secondary | ICD-10-CM | POA: Insufficient documentation

## 2012-04-22 DIAGNOSIS — F1210 Cannabis abuse, uncomplicated: Secondary | ICD-10-CM | POA: Insufficient documentation

## 2012-04-22 DIAGNOSIS — F17200 Nicotine dependence, unspecified, uncomplicated: Secondary | ICD-10-CM | POA: Insufficient documentation

## 2012-04-22 DIAGNOSIS — G8929 Other chronic pain: Secondary | ICD-10-CM | POA: Insufficient documentation

## 2012-04-22 DIAGNOSIS — R1032 Left lower quadrant pain: Secondary | ICD-10-CM | POA: Insufficient documentation

## 2012-04-22 DIAGNOSIS — R197 Diarrhea, unspecified: Secondary | ICD-10-CM | POA: Insufficient documentation

## 2012-04-22 DIAGNOSIS — I509 Heart failure, unspecified: Secondary | ICD-10-CM | POA: Insufficient documentation

## 2012-04-22 DIAGNOSIS — R112 Nausea with vomiting, unspecified: Secondary | ICD-10-CM | POA: Insufficient documentation

## 2012-04-22 DIAGNOSIS — Z8611 Personal history of tuberculosis: Secondary | ICD-10-CM | POA: Insufficient documentation

## 2012-04-22 DIAGNOSIS — R059 Cough, unspecified: Secondary | ICD-10-CM | POA: Insufficient documentation

## 2012-04-22 LAB — URINE MICROSCOPIC-ADD ON

## 2012-04-22 LAB — COMPREHENSIVE METABOLIC PANEL
Alkaline Phosphatase: 90 U/L (ref 39–117)
BUN: 5 mg/dL — ABNORMAL LOW (ref 6–23)
Calcium: 9.7 mg/dL (ref 8.4–10.5)
Creatinine, Ser: 0.61 mg/dL (ref 0.50–1.35)
GFR calc Af Amer: >90 mL/min (ref >90)
Glucose, Bld: 245 mg/dL — ABNORMAL HIGH (ref 70–99)
Potassium: 3.4 mEq/L — ABNORMAL LOW (ref 3.5–5.1)
Total Protein: 7.3 g/dL (ref 6.0–8.3)

## 2012-04-22 LAB — URINALYSIS, ROUTINE W REFLEX MICROSCOPIC
Nitrite: NEGATIVE
Specific Gravity, Urine: 1.036 — ABNORMAL HIGH (ref 1.005–1.030)
Urobilinogen, UA: 1 mg/dL (ref 0.0–1.0)

## 2012-04-22 LAB — CBC WITH DIFFERENTIAL/PLATELET
Eosinophils Absolute: 0 10*3/uL (ref 0.0–0.7)
Eosinophils Relative: 0 % (ref 0–5)
HCT: 45.2 % (ref 39.0–52.0)
Hemoglobin: 15.3 g/dL (ref 13.0–17.0)
Lymphs Abs: 1.4 10*3/uL (ref 0.7–4.0)
MCH: 31.5 pg (ref 26.0–34.0)
MCV: 93 fL (ref 78.0–100.0)
Monocytes Absolute: 0.6 10*3/uL (ref 0.1–1.0)
Monocytes Relative: 9 % (ref 3–12)
RBC: 4.86 MIL/uL (ref 4.22–5.81)

## 2012-04-22 LAB — OCCULT BLOOD, POC DEVICE: Fecal Occult Bld: NEGATIVE

## 2012-04-22 LAB — LIPASE, BLOOD: Lipase: 24 U/L (ref 11–59)

## 2012-04-22 MED ORDER — HYDROMORPHONE HCL PF 1 MG/ML IJ SOLN
1.0000 mg | Freq: Once | INTRAMUSCULAR | Status: AC
  Administered 2012-04-22: 1 mg via INTRAVENOUS
  Filled 2012-04-22: qty 1

## 2012-04-22 MED ORDER — AZITHROMYCIN 250 MG PO TABS: 250.0000 mg | ORAL_TABLET | Freq: Every day | ORAL | Status: DC

## 2012-04-22 MED ORDER — SODIUM CHLORIDE 0.9 % IV BOLUS (SEPSIS)
1000.0000 mL | Freq: Once | INTRAVENOUS | Status: AC
  Administered 2012-04-22: 1000 mL via INTRAVENOUS

## 2012-04-22 MED ORDER — SODIUM CHLORIDE 0.9 % IV BOLUS (SEPSIS)
500.0000 mL | Freq: Once | INTRAVENOUS | Status: AC
  Administered 2012-04-22: 1000 mL via INTRAVENOUS

## 2012-04-22 MED ORDER — ONDANSETRON HCL 4 MG/2ML IJ SOLN
4.0000 mg | Freq: Once | INTRAMUSCULAR | Status: AC
  Administered 2012-04-22: 4 mg via INTRAVENOUS
  Filled 2012-04-22: qty 2

## 2012-04-22 NOTE — ED Notes (Signed)
Pt reports nvd for a few days. Unable to keep food down. Also reports cough.

## 2012-04-22 NOTE — ED Provider Notes (Signed)
History     CSN: [id number]  Arrival date & time 04/22/12  4098   First MD Initiated Contact with Patient 04/22/12 [phone]      Chief Complaint  Patient presents with  . Nausea  . Emesis    (Consider location/radiation/quality/duration/timing/severity/associated sxs/prior treatment) HPI  61 year old male presents c/o abd with n/v/d x 3 days.  Pt reports gradual onset of sharp achy pain to his LLQ, constant, moderate in severity, non radiating, with non bloody, non mucousy diarrhea and non bloody non bilious vomits.  Symptoms has been persistent, can't keep his medication down.  No fever, chills, cp, sob, back pain, dysuria or rash.  Has occasional non productive cough.  Has appetite.  No recent abx use.    Past Medical History  Diagnosis Date  . Hypertension   . DVT (deep venous thrombosis) ~ 2011    BLE  . 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 Hyperlipidemia   . CAD (coronary atherosclerotic disease)     s/p stents x2  . Small bowel obstruction   . Pancreatitis   . Anxiety   . GERD (gastroesophageal reflux disease)   . CHF (congestive heart failure)   . Myocardial infarction ~ 2009  . Anginal pain   . Pneumonia ~ 2012  . Shortness of breath     "lying down & w/exertion" (03/04/2012)  . Type II diabetes mellitus   . Hep B w/o coma 1970's    "I was in [place]" (03/04/2012)  . Chronic lower back pain   . PTSD (post-traumatic stress disorder)     Past Surgical History  Procedure Date  . Esophagogastroduodenoscopy 09/22/2011    Procedure: ESOPHAGOGASTRODUODENOSCOPY (EGD);  Surgeon: Charna Elizabeth, MD;  Location: [facility] ENDOSCOPY;  Service: Endoscopy;  Laterality: N/A;  . Esophagogastroduodenoscopy 01/05/2012    Procedure: ESOPHAGOGASTRODUODENOSCOPY (EGD);  Surgeon: Rachael Fee, MD;  Location: [facility] ENDOSCOPY;  Service: Endoscopy;  Laterality: N/A;  . Cholecystectomy ~ 2009  . Spinal growth rods ~ 2011  . Tonsillectomy and adenoidectomy   . Coronary  angioplasty with stent placement ~ 2009`    "1 + ! (after 1st one failed)" (03/04/2012)    Family History  Problem Relation Age of Onset  . Diabetes Father   . Colon cancer Neg Hx     History  Substance Use Topics  . Smoking status: Current Every Day Smoker -- 0.5 packs/day for 38 years    Types: Cigarettes  . Smokeless tobacco: Never Used  . 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    Allergies  Lisinopril and Morphine and related  Home Medications   Current Outpatient Rx  Name  Route  Sig  Dispense  Refill  . ALPRAZOLAM 1 MG PO TABS   Oral   Take 1 tablet (1 mg total) by mouth 2 (two) times daily.   4 tablet   0   . ASPIRIN EC 81 MG PO TBEC   Oral   Take 81 mg by mouth daily.         Marland Kitchen CIPROFLOXACIN HCL 500 MG PO TABS   Oral   Take 1 tablet (500 mg total) by mouth 2 (two) times daily.   20 tablet   0   .  DULOXETINE HCL 30 MG PO CPEP   Oral   Take 1 capsule (30 mg total) by mouth daily.         Marland Kitchen GABAPENTIN 400 MG PO CAPS   Oral   Take 1 capsule (400 mg total) by mouth 3 (three) times daily.   30 capsule   0   . GLIPIZIDE ER 10 MG PO TB24   Oral   Take 10 mg by mouth 2 (two) times daily.         . INSULIN LISPRO PROT & LISPRO (75-25) 100 UNIT/ML [place] SUSP   Subcutaneous   Inject 12 Units into the skin 2 (two) times daily with a meal.         . LACTULOSE 10 GM/15ML PO SOLN   Oral   Take 20 g by mouth 2 (two) times daily.         Marland Kitchen LOSARTAN POTASSIUM 100 MG PO TABS   Oral   Take 100 mg by mouth daily.         Marland Kitchen METFORMIN HCL 850 MG PO TABS   Oral   Take 850 mg by mouth 3 (three) times daily.         Marland Kitchen METOCLOPRAMIDE HCL 10 MG PO TABS   Oral   Take 10 mg by mouth every 6 (six) hours as needed. For nausea         . METRONIDAZOLE 500 MG PO TABS   Oral   Take 1 tablet  (500 mg total) by mouth 3 (three) times daily.   30 tablet   0   . ONDANSETRON HCL 4 MG PO TABS   Oral   Take 4 mg by mouth every 6 (six) hours as needed. For nausea         . OXYCODONE HCL 30 MG PO TABS   Oral   Take 1 tablet (30 mg total) by mouth every 4 (four) hours as needed. For breakthrough pain   12 tablet   0   . OXYCODONE HCL ER 60 MG PO TB12   Oral   Take 1 tablet (60 mg total) by mouth every 12 (twelve) hours.   10 tablet   0   . OXYCODONE-ACETAMINOPHEN 5-325 MG PO TABS   Oral   Take 1 tablet by mouth every 4 (four) hours as needed for pain.   20 tablet   0   . PANTOPRAZOLE SODIUM 40 MG PO TBEC   Oral   Take 1 tablet (40 mg total) by mouth daily.   30 tablet   0   . PROMETHAZINE HCL 25 MG PO TABS   Oral   Take 1 tablet (25 mg total) by mouth every 6 (six) hours as needed. For nausea   30 tablet   0   . ZANTAC PO   Oral   Take 300 mg by mouth 2 (two) times daily.          . SENNA-DOCUSATE SODIUM 8.6-50 MG PO TABS   Oral   Take 2 tablets by mouth daily. For constipation   60 tablet   0     BP 145/73  Pulse 65  Temp 98.6 F (37 C) (Oral)  Resp 22  SpO2 95%  Physical Exam  Nursing note and vitals reviewed. Constitutional: He is oriented to person, place, and time. He appears well-developed and well-nourished. No distress.       Awake, alert, nontoxic appearance.  Unkept.  HENT:  Head: Atraumatic.  Lips dry, oral mucosa moist  Eyes: Conjunctivae normal are normal. Right eye exhibits no discharge. Left eye exhibits no discharge.  Neck: Normal range of motion. Neck supple.  Cardiovascular: Normal rate and regular rhythm.   Pulmonary/Chest: Effort normal. No respiratory distress. He exhibits no tenderness.  Abdominal: Soft. There is no tenderness (mild LLQ tenderness without guarding or rebound tenderness. No hernia, no overlying skin changes.  bowel sound active). There is no rebound and no guarding.  Genitourinary: Rectum normal.  Guaiac negative stool.  Musculoskeletal: Normal range of motion. He exhibits no edema and no tenderness.       ROM appears intact, no obvious focal weakness  Neurological: He is alert and oriented to person, place, and time.  Skin: Skin is warm and dry. No rash noted.  Psychiatric: He has a normal mood and affect.    ED Course  Procedures (including critical care time)  Labs Reviewed  COMPREHENSIVE METABOLIC PANEL - Abnormal; Notable for the following:    Potassium 3.4 (*)     Glucose, Bld 245 (*)     BUN 5 (*)     Albumin 3.4 (*)     Total Bilirubin 0.2 (*)     All other components within normal limits  CBC WITH DIFFERENTIAL  LIPASE, BLOOD  OCCULT BLOOD, POC DEVICE  URINALYSIS, ROUTINE W REFLEX MICROSCOPIC   Dg Abd Acute W/chest  04/22/2012  *RADIOLOGY REPORT*  Clinical Data: Shortness of breath, cough, mid abdominal pain, nausea, vomiting, and diarrhea  ACUTE ABDOMEN SERIES (ABDOMEN 2 VIEW & CHEST 1 VIEW)  Comparison: Chest and acute abdomen of 03/04/2012 and CT abdomen pelvis of 03/28/2012  Findings: Prominent lung markings remain which appear chronic, as well as changes of chronic bronchitis.  However no focal infiltrate or effusion is seen.  The heart is within upper limits normal.  Supine and erect views of the abdomen show no bowel obstruction. There is air throughout the nondistended colon.  No free intraperitoneal air is seen.  Surgical clips are present from prior cholecystectomy.  Hardware for fusion of L4-5 is again noted.  IMPRESSION:  1.  Chronic changes throughout the lungs with chronic bronchitis. No active lung disease. 2.  No bowel obstruction.  No free air.   Original Report Authenticated By: Dwyane Dee, M.D.      No diagnosis found.  Results for orders placed during the hospital encounter of 04/22/12  CBC WITH DIFFERENTIAL      Component Value Range   WBC 6.1  4.0 - 10.5 K/uL   RBC 4.86  4.22 - 5.81 MIL/uL   Hemoglobin 15.3  13.0 - 17.0 g/dL   HCT 16.1  09.6  - 04.5 %   MCV 93.0  78.0 - 100.0 fL   MCH 31.5  26.0 - 34.0 pg   MCHC 33.8  30.0 - 36.0 g/dL   RDW 40.9  81.1 - 91.4 %   Platelets 212  150 - 400 K/uL   Neutrophils Relative 67  43 - 77 %   Neutro Abs 4.0  1.7 - 7.7 K/uL   Lymphocytes Relative 23  12 - 46 %   Lymphs Abs 1.4  0.7 - 4.0 K/uL   Monocytes Relative 9  3 - 12 %   Monocytes Absolute 0.6  0.1 - 1.0 K/uL   Eosinophils Relative 0  0 - 5 %   Eosinophils Absolute 0.0  0.0 - 0.7 K/uL   Basophils Relative 1  0 - 1 %   Basophils  Absolute 0.0  0.0 - 0.1 K/uL  COMPREHENSIVE METABOLIC PANEL      Component Value Range   Sodium 137  135 - 145 mEq/L   Potassium 3.4 (*) 3.5 - 5.1 mEq/L   Chloride 98  96 - 112 mEq/L   CO2 25  19 - 32 mEq/L   Glucose, Bld 245 (*) 70 - 99 mg/dL   BUN 5 (*) 6 - 23 mg/dL   Creatinine, Ser 1.91  0.50 - 1.35 mg/dL   Calcium 9.7  8.4 - 47.8 mg/dL   Total Protein 7.3  6.0 - 8.3 g/dL   Albumin 3.4 (*) 3.5 - 5.2 g/dL   AST 12  0 - 37 U/L   ALT 9  0 - 53 U/L   Alkaline Phosphatase 90  39 - 117 U/L   Total Bilirubin 0.2 (*) 0.3 - 1.2 mg/dL   GFR calc non Af Amer >90  >90 mL/min   GFR calc Af Amer >90  >90 mL/min  LIPASE, BLOOD      Component Value Range   Lipase 24  11 - 59 U/L  URINALYSIS, ROUTINE W REFLEX MICROSCOPIC      Component Value Range   Color, Urine AMBER (*) YELLOW   APPearance CLOUDY (*) CLEAR   Specific Gravity, Urine 1.036 (*) 1.005 - 1.030   pH 8.0  5.0 - 8.0   Glucose, UA 100 (*) NEGATIVE mg/dL   Hgb urine dipstick NEGATIVE  NEGATIVE   Bilirubin Urine SMALL (*) NEGATIVE   Ketones, ur >80 (*) NEGATIVE mg/dL   Protein, ur >295 (*) NEGATIVE mg/dL   Urobilinogen, UA 1.0  0.0 - 1.0 mg/dL   Nitrite NEGATIVE  NEGATIVE   Leukocytes, UA SMALL (*) NEGATIVE  OCCULT BLOOD, POC DEVICE      Component Value Range   Fecal Occult Bld NEGATIVE  NEGATIVE  URINE MICROSCOPIC-ADD ON      Component Value Range   Squamous Epithelial / LPF RARE  RARE   WBC, UA 7-10  <3 WBC/hpf   RBC / HPF 3-6  <3  RBC/hpf   Bacteria, UA FEW (*) RARE   Casts HYALINE CASTS (*) NEGATIVE   Urine-Other MUCOUS PRESENT     Ct Abdomen Pelvis W Contrast  03/28/2012  **ADDENDUM** CREATED: 03/28/2012 13:31:20  Sacroiliac joint sclerosis, left greater than right. Findings suggest sacroiliitis.  Question if the patient has sacroiliac joint pain.  **END ADDENDUM** SIGNED BY: Britta Mccreedy, M.D.   03/28/2012  *RADIOLOGY REPORT*  Clinical Data: Abdominal pain, left lower quadrant.  Dizziness and emesis.  CT ABDOMEN AND PELVIS WITH CONTRAST  Technique:  Multidetector CT imaging of the abdomen and pelvis was performed following the standard protocol during bolus administration of intravenous contrast.  Contrast: OMNIPAQUE IOHEXOL 300 MG/ML  SOLN  Comparison: CT abdomen pelvis 03/01/2012  Findings: Heart size is normal.  The imaged lung bases are clear. There is no pleural effusion.  The scout view of the abdomen pelvis demonstrates a nonobstructive bowel gas pattern.  The patient is status post cholecystectomy.  Minimal intrahepatic biliary ductal dilatation, likely related cholecystectomy is stable.  The liver is normal in size.  No focal hepatic lesions. The spleen, adrenal glands, and pancreas are within normal limits. Common bile duct is normal in caliber.  Stable upper pole right renal cyst measures approximate 18 mm.  Stable lower pole left renal cyst measures approximately 2.5 cm. Stable 6 mm probable cyst in the lateral lower pole of the left kidney.  There is no hydronephrosis.  The stomach appears within normal limits.  The small bowel loops and colon are normal in caliber.  No gastric wall thickening is identified.  There is diverticulosis of the colon, most prominent in the sigmoid region.  Normal appendix on images 55-58.  Urinary bladder is normal.  Normal sized prostate gland, containing central calcification.  The abdominal aorta is normal in caliber.  Umbilical hernia contains fat only.  Negative for ascites,  free air, or abscess. The abdominal aorta is normal in caliber.  There are stable postoperative changes at the L4-L5, with posterior fusion and interbody spacer.  No hardware complication identified.  There is sclerosis about the sacroiliac joints bilaterally suggesting bilateral sacroiliitis.  IMPRESSION:  1.  Nonobstructive bowel gas pattern. No acute inflammatory changes or other acute abnormality identified in the abdomen or pelvis. 2.  Diverticulosis, most prominent in the sigmoid colon. 3.  Stable low density lesions in both kidneys, most likely cysts. 4.  Status post cholecystectomy. 5.  Normal appendix.  Original Report Authenticated By: Britta Mccreedy, M.D.    Dg Abd Acute W/chest  04/22/2012  *RADIOLOGY REPORT*  Clinical Data: Shortness of breath, cough, mid abdominal pain, nausea, vomiting, and diarrhea  ACUTE ABDOMEN SERIES (ABDOMEN 2 VIEW & CHEST 1 VIEW)  Comparison: Chest and acute abdomen of 03/04/2012 and CT abdomen pelvis of 03/28/2012  Findings: Prominent lung markings remain which appear chronic, as well as changes of chronic bronchitis.  However no focal infiltrate or effusion is seen.  The heart is within upper limits normal.  Supine and erect views of the abdomen show no bowel obstruction. There is air throughout the nondistended colon.  No free intraperitoneal air is seen.  Surgical clips are present from prior cholecystectomy.  Hardware for fusion of L4-5 is again noted.  IMPRESSION:  1.  Chronic changes throughout the lungs with chronic bronchitis. No active lung disease. 2.  No bowel obstruction.  No free air.   Original Report Authenticated By: Dwyane Dee, M.D.     1. Abdominal pain 2. Nausea/vomit/diarrhea 3. bronchitis  MDM  Pt presents with abd pain.  Has prior hx of diverticulosis and was given cipro/flagyl last month.  CT scan less than a month ago was unremarkable.  His abdomen is non surgical.  Is afebrile, VSS.  Pain medication given, work up initiated.    8:30 AM Labs  are unremarkable. Acute abdomen series without evidence of bowel obstruction or free air.   CBG 245, IVF given.  Pt continues to demand more pain meds, similar to prior visits.  Care discussed with my attending.    9:15 AM Pt with relief with pain medication.  No concerning emergent medical condition.  Pt will be discharge with f/u instruction to PCP for further care.  Will also treat for bronchitis with zithromax.    BP 145/73  Pulse 65  Temp 98.6 F (37 C) (Oral)  Resp 22  SpO2 95%  I have reviewed nursing notes and vital signs. I personally reviewed the imaging tests through PACS system  I reviewed available ER/hospitalization records thought the EMR       Fayrene Helper, [place] 04/22/12 0981

## 2012-04-23 LAB — URINE CULTURE: Colony Count: >=100000

## 2012-04-23 NOTE — ED Provider Notes (Signed)
Medical screening examination/treatment/procedure(s) were performed by non-physician practitioner and as supervising physician I was immediately available for consultation/collaboration.  Doug Sou, MD 04/23/12 0130

## 2012-04-24 NOTE — ED Notes (Signed)
+  urine Patient treated with Cipro-sts-chart appended per protocol MD.

## 2012-04-27 ENCOUNTER — Emergency Department (HOSPITAL_COMMUNITY): Payer: Medicare Other

## 2012-04-27 ENCOUNTER — Emergency Department (HOSPITAL_COMMUNITY)
Admission: EM | Admit: 2012-04-27 | Discharge: 2012-04-27 | Disposition: A | Discharge status: Home / Self Care | Payer: Medicare Other | Attending: Emergency Medicine | Admitting: Emergency Medicine

## 2012-04-27 ENCOUNTER — Encounter (HOSPITAL_COMMUNITY): Payer: Self-pay | Admitting: Emergency Medicine

## 2012-04-27 DIAGNOSIS — IMO0001 Reserved for inherently not codable concepts without codable children: Secondary | ICD-10-CM | POA: Insufficient documentation

## 2012-04-27 DIAGNOSIS — Z79899 Other long term (current) drug therapy: Secondary | ICD-10-CM | POA: Insufficient documentation

## 2012-04-27 DIAGNOSIS — R110 Nausea: Secondary | ICD-10-CM | POA: Insufficient documentation

## 2012-04-27 DIAGNOSIS — F411 Generalized anxiety disorder: Secondary | ICD-10-CM | POA: Insufficient documentation

## 2012-04-27 DIAGNOSIS — Z8701 Personal history of pneumonia (recurrent): Secondary | ICD-10-CM | POA: Insufficient documentation

## 2012-04-27 DIAGNOSIS — G8929 Other chronic pain: Secondary | ICD-10-CM

## 2012-04-27 DIAGNOSIS — R5383 Other fatigue: Secondary | ICD-10-CM | POA: Insufficient documentation

## 2012-04-27 DIAGNOSIS — K589 Irritable bowel syndrome without diarrhea: Secondary | ICD-10-CM | POA: Insufficient documentation

## 2012-04-27 DIAGNOSIS — I252 Old myocardial infarction: Secondary | ICD-10-CM | POA: Insufficient documentation

## 2012-04-27 DIAGNOSIS — K219 Gastro-esophageal reflux disease without esophagitis: Secondary | ICD-10-CM | POA: Insufficient documentation

## 2012-04-27 DIAGNOSIS — I2510 Atherosclerotic heart disease of native coronary artery without angina pectoris: Secondary | ICD-10-CM | POA: Insufficient documentation

## 2012-04-27 DIAGNOSIS — I10 Essential (primary) hypertension: Secondary | ICD-10-CM | POA: Insufficient documentation

## 2012-04-27 DIAGNOSIS — Z7982 Long term (current) use of aspirin: Secondary | ICD-10-CM | POA: Insufficient documentation

## 2012-04-27 DIAGNOSIS — R112 Nausea with vomiting, unspecified: Secondary | ICD-10-CM | POA: Insufficient documentation

## 2012-04-27 DIAGNOSIS — R0602 Shortness of breath: Secondary | ICD-10-CM | POA: Insufficient documentation

## 2012-04-27 DIAGNOSIS — Z794 Long term (current) use of insulin: Secondary | ICD-10-CM | POA: Insufficient documentation

## 2012-04-27 DIAGNOSIS — E785 Hyperlipidemia, unspecified: Secondary | ICD-10-CM | POA: Insufficient documentation

## 2012-04-27 DIAGNOSIS — E119 Type 2 diabetes mellitus without complications: Secondary | ICD-10-CM | POA: Insufficient documentation

## 2012-04-27 DIAGNOSIS — R509 Fever, unspecified: Secondary | ICD-10-CM | POA: Insufficient documentation

## 2012-04-27 DIAGNOSIS — R109 Unspecified abdominal pain: Secondary | ICD-10-CM | POA: Insufficient documentation

## 2012-04-27 DIAGNOSIS — Z8719 Personal history of other diseases of the digestive system: Secondary | ICD-10-CM | POA: Insufficient documentation

## 2012-04-27 DIAGNOSIS — R5381 Other malaise: Secondary | ICD-10-CM | POA: Insufficient documentation

## 2012-04-27 DIAGNOSIS — F17200 Nicotine dependence, unspecified, uncomplicated: Secondary | ICD-10-CM | POA: Insufficient documentation

## 2012-04-27 DIAGNOSIS — R197 Diarrhea, unspecified: Secondary | ICD-10-CM | POA: Insufficient documentation

## 2012-04-27 DIAGNOSIS — R42 Dizziness and giddiness: Secondary | ICD-10-CM | POA: Insufficient documentation

## 2012-04-27 DIAGNOSIS — F4310 Post-traumatic stress disorder, unspecified: Secondary | ICD-10-CM | POA: Insufficient documentation

## 2012-04-27 LAB — COMPREHENSIVE METABOLIC PANEL
CO2: 21 mEq/L (ref 19–32)
Calcium: 9.8 mg/dL (ref 8.4–10.5)
Creatinine, Ser: 0.75 mg/dL (ref 0.50–1.35)
GFR calc Af Amer: >90 mL/min (ref >90)
GFR calc non Af Amer: >90 mL/min (ref >90)
Glucose, Bld: 430 mg/dL — ABNORMAL HIGH (ref 70–99)

## 2012-04-27 LAB — CBC WITH DIFFERENTIAL/PLATELET
Eosinophils Absolute: 0.2 10*3/uL (ref 0.0–0.7)
Eosinophils Relative: 2 % (ref 0–5)
HCT: 48.4 % (ref 39.0–52.0)
Lymphocytes Relative: 35 % (ref 12–46)
Lymphs Abs: 2.7 10*3/uL (ref 0.7–4.0)
MCH: 31.8 pg (ref 26.0–34.0)
MCV: 92.7 fL (ref 78.0–100.0)
Monocytes Absolute: 0.7 10*3/uL (ref 0.1–1.0)
RDW: 13.2 % (ref 11.5–15.5)
WBC: 7.7 10*3/uL (ref 4.0–10.5)

## 2012-04-27 LAB — GLUCOSE, CAPILLARY: Glucose-Capillary: 362 mg/dL — ABNORMAL HIGH (ref 70–99)

## 2012-04-27 LAB — LACTIC ACID, PLASMA: Lactic Acid, Venous: 1.6 mmol/L (ref 0.5–2.2)

## 2012-04-27 LAB — LIPASE, BLOOD: Lipase: 86 U/L — ABNORMAL HIGH (ref 11–59)

## 2012-04-27 LAB — POCT I-STAT 3, VENOUS BLOOD GAS (G3P V)
TCO2: 25 mmol/L (ref 0–100)
pCO2, Ven: 44.7 mmHg — ABNORMAL LOW (ref 45.0–50.0)
pH, Ven: 7.325 — ABNORMAL HIGH (ref 7.250–7.300)

## 2012-04-27 LAB — TROPONIN I: Troponin I: <0.3 ng/mL (ref <0.30)

## 2012-04-27 MED ORDER — SODIUM CHLORIDE 0.9 % IV BOLUS (SEPSIS)
500.0000 mL | Freq: Once | INTRAVENOUS | Status: AC
  Administered 2012-04-27: 500 mL via INTRAVENOUS

## 2012-04-27 MED ORDER — OXYCODONE-ACETAMINOPHEN 5-325 MG PO TABS
2.0000 | ORAL_TABLET | Freq: Once | ORAL | Status: AC
  Administered 2012-04-27: 2 via ORAL
  Filled 2012-04-27: qty 2

## 2012-04-27 MED ORDER — SODIUM CHLORIDE 0.9 % IV SOLN
Freq: Once | INTRAVENOUS | Status: AC
  Administered 2012-04-27: 12:00:00 via INTRAVENOUS

## 2012-04-27 MED ORDER — ONDANSETRON HCL 4 MG/2ML IJ SOLN
4.0000 mg | Freq: Once | INTRAMUSCULAR | Status: AC
  Administered 2012-04-27: 4 mg via INTRAVENOUS
  Filled 2012-04-27: qty 2

## 2012-04-27 MED ORDER — HYDROMORPHONE HCL PF 1 MG/ML IJ SOLN
1.0000 mg | Freq: Once | INTRAMUSCULAR | Status: AC
  Administered 2012-04-27: 1 mg via INTRAVENOUS
  Filled 2012-04-27: qty 1

## 2012-04-27 MED ORDER — INSULIN ASPART 100 UNIT/ML ~~LOC~~ SOLN
5.0000 [IU] | Freq: Once | SUBCUTANEOUS | Status: AC
  Administered 2012-04-27: 100 [IU] via INTRAVENOUS
  Filled 2012-04-27: qty 1

## 2012-04-27 MED ORDER — OXYCODONE HCL 30 MG PO TABS: 30.0000 mg | ORAL_TABLET | ORAL | Status: DC | PRN

## 2012-04-27 MED ORDER — IOHEXOL 350 MG/ML SOLN
100.0000 mL | Freq: Once | INTRAVENOUS | Status: AC | PRN
  Administered 2012-04-27: 100 mL via INTRAVENOUS

## 2012-04-27 MED ORDER — OXYCODONE-ACETAMINOPHEN 5-325 MG PO TABS: 1.0000 | ORAL_TABLET | ORAL | Status: DC | PRN

## 2012-04-27 MED ORDER — OXYCODONE HCL ER 60 MG PO T12A: 1.0000 | EXTENDED_RELEASE_TABLET | Freq: Two times a day (BID) | ORAL | Status: DC

## 2012-04-27 NOTE — ED Provider Notes (Signed)
History     CSN: [id number]  Arrival date & time 04/27/12  5621   First MD Initiated Contact with Patient 04/27/12 [phone]      Chief Complaint  Patient presents with  . Emesis    (Consider location/radiation/quality/duration/timing/severity/associated sxs/prior treatment) HPI Comments: Patient presents with nausea, vomiting, left upper quadrant pain, shortness of breath and cough. He was seen 5 days ago for similar symptoms and placed on Zithromax for bronchitis he completed. States he is weak, unable to care for himself at home, feeling nauseated and unable to get his medicines. He has a history of chronic abdominal pain nausea vomiting. Denies any fevers, chills, urinary symptoms. He states the pain in his abdomen is similar to previous.  The history is provided by the patient.    Past Medical History  Diagnosis Date  . Hypertension   . DVT (deep venous thrombosis) ~ 2011    BLE  . 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 Hyperlipidemia   . CAD (coronary atherosclerotic disease)     s/p stents x2  . Small bowel obstruction   . Pancreatitis   . Anxiety   . GERD (gastroesophageal reflux disease)   . CHF (congestive heart failure)   . Myocardial infarction ~ 2009  . Anginal pain   . Pneumonia ~ 2012  . Shortness of breath     "lying down & w/exertion" (03/04/2012)  . Type II diabetes mellitus   . Hep B w/o coma 1970's    "I was in [place]" (03/04/2012)  . Chronic lower back pain   . PTSD (post-traumatic stress disorder)     Past Surgical History  Procedure Date  . Esophagogastroduodenoscopy 09/22/2011    Procedure: ESOPHAGOGASTRODUODENOSCOPY (EGD);  Surgeon: Charna Elizabeth, MD;  Location: [facility] ENDOSCOPY;  Service: Endoscopy;  Laterality: N/A;  . Esophagogastroduodenoscopy 01/05/2012    Procedure: ESOPHAGOGASTRODUODENOSCOPY (EGD);  Surgeon: Rachael Fee, MD;  Location: [facility] ENDOSCOPY;  Service: Endoscopy;  Laterality: N/A;  . Cholecystectomy ~ 2009  .  Spinal growth rods ~ 2011  . Tonsillectomy and adenoidectomy   . Coronary angioplasty with stent placement ~ 2009`    "1 + ! (after 1st one failed)" (03/04/2012)    Family History  Problem Relation Age of Onset  . Diabetes Father   . Colon cancer Neg Hx     History  Substance Use Topics  . Smoking status: Current Every Day Smoker -- 0.5 packs/day for 38 years    Types: Cigarettes  . Smokeless tobacco: Never Used  . 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  HENT: Negative for congestion and rhinorrhea.   Respiratory: Positive for cough and shortness of breath.   Gastrointestinal: Positive for nausea, vomiting, abdominal pain and diarrhea.  Genitourinary: Negative for dysuria and hematuria.  Musculoskeletal: Positive for myalgias and arthralgias.  Skin: Negative for rash.  Neurological: Positive for weakness and light-headedness.  A complete 10 system review of systems was obtained and all systems are negative except as noted in the HPI and PMH.    Allergies  Lisinopril and Morphine and related  Home Medications   Current Outpatient Rx  Name  Route  Sig  Dispense  Refill  . ALPRAZOLAM 1 MG PO TABS  Oral   Take 1 tablet (1 mg total) by mouth 2 (two) times daily.   4 tablet   0   . ASPIRIN EC 81 MG PO TBEC   Oral   Take 81 mg by mouth daily.         . DULOXETINE HCL 30 MG PO CPEP   Oral   Take 1 capsule (30 mg total) by mouth daily.         Marland Kitchen GABAPENTIN 400 MG PO CAPS   Oral   Take 1 capsule (400 mg total) by mouth 3 (three) times daily.   30 capsule   0   . GLIPIZIDE ER 10 MG PO TB24   Oral   Take 10 mg by mouth 2 (two) times daily.         . INSULIN LISPRO PROT & LISPRO (75-25) 100 UNIT/ML [place] SUSP   Subcutaneous   Inject 12 Units into the skin 2 (two)  times daily with a meal.         . LACTULOSE 10 GM/15ML PO SOLN   Oral   Take 20 g by mouth 2 (two) times daily.         Marland Kitchen LOSARTAN POTASSIUM 100 MG PO TABS   Oral   Take 100 mg by mouth daily.         Marland Kitchen METFORMIN HCL 850 MG PO TABS   Oral   Take 850 mg by mouth 3 (three) times daily.         Marland Kitchen METOCLOPRAMIDE HCL 10 MG PO TABS   Oral   Take 10 mg by mouth every 6 (six) hours as needed. For nausea         . ONDANSETRON HCL 4 MG PO TABS   Oral   Take 4 mg by mouth every 6 (six) hours as needed. For nausea         . OXYCODONE HCL 30 MG PO TABS   Oral   Take 1 tablet (30 mg total) by mouth every 4 (four) hours as needed. For breakthrough pain   12 tablet   0   . OXYCODONE HCL ER 60 MG PO TB12   Oral   Take 1 tablet (60 mg total) by mouth every 12 (twelve) hours.   10 tablet   0   . OXYCODONE-ACETAMINOPHEN 5-325 MG PO TABS   Oral   Take 1 tablet by mouth every 4 (four) hours as needed for pain.   20 tablet   0   . PANTOPRAZOLE SODIUM 40 MG PO TBEC   Oral   Take 1 tablet (40 mg total) by mouth daily.   30 tablet   0   . PROMETHAZINE HCL 25 MG PO TABS   Oral   Take 1 tablet (25 mg total) by mouth every 6 (six) hours as needed. For nausea   30 tablet   0   . ZANTAC PO   Oral   Take 300 mg by mouth 2 (two) times daily.          . SENNA-DOCUSATE SODIUM 8.6-50 MG PO TABS   Oral   Take 2 tablets by mouth daily. For constipation   60 tablet   0     BP 142/72  Pulse 91  Temp 99.4 F (37.4 C) (Oral)  Resp 17  Ht 6\' 5"  (1.956 m)  Wt 306 lb (138.801 kg)  BMI 36.29 kg/m2  SpO2 95%  Physical Exam  Constitutional: He is oriented to person,  place, and time. He appears well-developed and well-nourished. No distress.  HENT:  Head: Normocephalic and atraumatic.  Mouth/Throat: Oropharynx is clear and moist. No oropharyngeal exudate.  Eyes: Conjunctivae normal and EOM are normal. Pupils are equal, round, and reactive to light.  Neck: Normal range of  motion. Neck supple.  Cardiovascular: Normal rate, regular rhythm and normal heart sounds.   Pulmonary/Chest: Effort normal and breath sounds normal. No respiratory distress.  Abdominal: Soft. There is tenderness. There is no rebound and no guarding.       Mild LUQ tenderness  Musculoskeletal: Normal range of motion. He exhibits no edema and no tenderness.  Neurological: He is alert and oriented to person, place, and time. No cranial nerve deficit. He exhibits normal muscle tone. Coordination normal.  Skin: Skin is warm.    ED Course  Procedures (including critical care time)  Labs Reviewed  COMPREHENSIVE METABOLIC PANEL - Abnormal; Notable for the following:    Glucose, Bld 430 (*)     All other components within normal limits  LIPASE, BLOOD - Abnormal; Notable for the following:    Lipase 86 (*)     All other components within normal limits  D-DIMER, QUANTITATIVE - Abnormal; Notable for the following:    D-Dimer, Quant 0.62 (*)     All other components within normal limits  CBC WITH DIFFERENTIAL  TROPONIN I  LACTIC ACID, PLASMA  PRO B NATRIURETIC PEPTIDE  URINALYSIS, ROUTINE W REFLEX MICROSCOPIC  KETONES, QUALITATIVE   Dg Chest 2 View  04/27/2012  *RADIOLOGY REPORT*  Clinical Data: Shortness of breath, cough  CHEST - 2 VIEW  Comparison: 04/22/2012  Findings: Normal heart size and stable prominent central vascularity.  Slight diffuse interstitial changes, unchanged.  No definite superimposed edema or pneumonia.  Remote left rib fractures evident.  Coronary stents identified.  Trachea is midline.  No collapse, consolidation, effusion or pneumothorax.  IMPRESSION: Stable chronic changes.  No superimposed acute process   Original Report Authenticated By: Judie Petit. Miles Costain, M.D.      No diagnosis found.    MDM  5 days of nausea, vomiting, abdominal pain similar to previous. Cough and shortness of breath with recent treatment for bronchitis. Vital stable, MAXIMUM TEMPERATURE 100, no  hypoxia or increased work of breathing. On cipro for recent positive UTI.  Chest x-ray shows no pneumonia. Patient has not had any vomiting in the ED. Hyperglycemia without evidence of DKA. Anion gap 15.  Abdomen remains soft and nonsurgical. We'll treat hyperglycemia. Po challenge.      Date: 04/27/2012  Rate: 96  Rhythm: normal sinus rhythm  QRS Axis: normal  Intervals: normal  ST/T Wave abnormalities: nonspecific ST/T changes  Conduction Disutrbances:none  Narrative Interpretation:   Old EKG Reviewed: unchanged    Glynn Octave, MD 04/27/12 1610

## 2012-04-27 NOTE — ED Provider Notes (Signed)
12:01 PM Assumed care of patient from Dr. Manus Gunning.  Patient presents with a chief complaint of nausea, vomiting, and SOB.  Patient has a history of chronic abdominal pain.  Patient found to have an elevated D-dimer.  Plan is for the patient to have a CT Angiogram of his chest to rule out PE and to have a fluid challenge.  If CT angiogram is negative and patient able to tolerate PO liquids, the patient will be discharged.   Reassessed patient.  Patient alert and orientated x 3, Heart RRR, Lungs CTAB, Abdomen soft with tenderness to palpation of the LUQ and the LLQ.  2:49 PM Reassessed patient.  He reports that he is having significant pain at this time.  He is requesting more pain medication.  Results of the CTA are pending.  Patient has been able to tolerate PO liquids.  Patient alert and orientated x 3, Heart RRR, Lungs CTAB, Abdomen soft with tenderness to palpation of the LUQ and the LLQ.    3:22 PM Patient signed out to Felicie Morn, NP at shift change.    Pascal Lux [place], PA-C 04/27/12 1616

## 2012-04-27 NOTE — ED Notes (Signed)
Diet gingerale given

## 2012-04-27 NOTE — ED Notes (Signed)
Pt was moved from Pod A to CDU; pt placed on monitor, continuous pulse oximetry and blood pressure cuff; pt also handed an urinal

## 2012-04-27 NOTE — ED Notes (Signed)
Transferred to CDU -- rm 12

## 2012-04-27 NOTE — ED Notes (Signed)
Patient complains of nausea,vomiting times 4 days. Fever and abdominal pain

## 2012-04-27 NOTE — ED Provider Notes (Signed)
Medical screening examination/treatment/procedure(s) were conducted as a shared visit with non-physician practitioner(s) and myself.  I personally evaluated the patient during the encounter   Glynn Octave, MD 04/27/12 1622

## 2012-05-04 ENCOUNTER — Emergency Department (HOSPITAL_COMMUNITY): Payer: Medicare Other

## 2012-05-04 ENCOUNTER — Observation Stay (HOSPITAL_COMMUNITY): Payer: Medicare Other

## 2012-05-04 ENCOUNTER — Inpatient Hospital Stay (HOSPITAL_COMMUNITY)
Admission: EM | Admit: 2012-05-04 | Discharge: 2012-05-07 | DRG: 390 | Disposition: A | Discharge status: Home / Self Care | Payer: Medicare Other | Attending: Internal Medicine | Admitting: Internal Medicine

## 2012-05-04 ENCOUNTER — Encounter (HOSPITAL_COMMUNITY): Payer: Self-pay | Admitting: Emergency Medicine

## 2012-05-04 DIAGNOSIS — E119 Type 2 diabetes mellitus without complications: Secondary | ICD-10-CM

## 2012-05-04 DIAGNOSIS — E785 Hyperlipidemia, unspecified: Secondary | ICD-10-CM | POA: Diagnosis present

## 2012-05-04 DIAGNOSIS — E860 Dehydration: Secondary | ICD-10-CM

## 2012-05-04 DIAGNOSIS — Z7982 Long term (current) use of aspirin: Secondary | ICD-10-CM

## 2012-05-04 DIAGNOSIS — I252 Old myocardial infarction: Secondary | ICD-10-CM

## 2012-05-04 DIAGNOSIS — Z8719 Personal history of other diseases of the digestive system: Secondary | ICD-10-CM

## 2012-05-04 DIAGNOSIS — I509 Heart failure, unspecified: Secondary | ICD-10-CM | POA: Diagnosis present

## 2012-05-04 DIAGNOSIS — K5903 Drug induced constipation: Secondary | ICD-10-CM

## 2012-05-04 DIAGNOSIS — R197 Diarrhea, unspecified: Secondary | ICD-10-CM | POA: Diagnosis present

## 2012-05-04 DIAGNOSIS — Z79899 Other long term (current) drug therapy: Secondary | ICD-10-CM

## 2012-05-04 DIAGNOSIS — Z86718 Personal history of other venous thrombosis and embolism: Secondary | ICD-10-CM

## 2012-05-04 DIAGNOSIS — Z9861 Coronary angioplasty status: Secondary | ICD-10-CM

## 2012-05-04 DIAGNOSIS — F17200 Nicotine dependence, unspecified, uncomplicated: Secondary | ICD-10-CM | POA: Diagnosis present

## 2012-05-04 DIAGNOSIS — K259 Gastric ulcer, unspecified as acute or chronic, without hemorrhage or perforation: Secondary | ICD-10-CM

## 2012-05-04 DIAGNOSIS — M549 Dorsalgia, unspecified: Secondary | ICD-10-CM | POA: Diagnosis present

## 2012-05-04 DIAGNOSIS — I2510 Atherosclerotic heart disease of native coronary artery without angina pectoris: Secondary | ICD-10-CM | POA: Diagnosis present

## 2012-05-04 DIAGNOSIS — G894 Chronic pain syndrome: Secondary | ICD-10-CM | POA: Diagnosis present

## 2012-05-04 DIAGNOSIS — R933 Abnormal findings on diagnostic imaging of other parts of digestive tract: Secondary | ICD-10-CM

## 2012-05-04 DIAGNOSIS — M545 Low back pain: Secondary | ICD-10-CM

## 2012-05-04 DIAGNOSIS — R112 Nausea with vomiting, unspecified: Secondary | ICD-10-CM

## 2012-05-04 DIAGNOSIS — F1120 Opioid dependence, uncomplicated: Secondary | ICD-10-CM | POA: Diagnosis present

## 2012-05-04 DIAGNOSIS — I10 Essential (primary) hypertension: Secondary | ICD-10-CM | POA: Diagnosis present

## 2012-05-04 DIAGNOSIS — K219 Gastro-esophageal reflux disease without esophagitis: Secondary | ICD-10-CM | POA: Diagnosis present

## 2012-05-04 DIAGNOSIS — Z794 Long term (current) use of insulin: Secondary | ICD-10-CM

## 2012-05-04 DIAGNOSIS — F411 Generalized anxiety disorder: Secondary | ICD-10-CM | POA: Diagnosis present

## 2012-05-04 DIAGNOSIS — E876 Hypokalemia: Secondary | ICD-10-CM

## 2012-05-04 DIAGNOSIS — K56609 Unspecified intestinal obstruction, unspecified as to partial versus complete obstruction: Principal | ICD-10-CM | POA: Diagnosis present

## 2012-05-04 DIAGNOSIS — F4310 Post-traumatic stress disorder, unspecified: Secondary | ICD-10-CM | POA: Diagnosis present

## 2012-05-04 DIAGNOSIS — R109 Unspecified abdominal pain: Secondary | ICD-10-CM

## 2012-05-04 LAB — BASIC METABOLIC PANEL
CO2: 23 mEq/L (ref 19–32)
Calcium: 10 mg/dL (ref 8.4–10.5)
Chloride: 103 mEq/L (ref 96–112)
Creatinine, Ser: 0.65 mg/dL (ref 0.50–1.35)
Glucose, Bld: 369 mg/dL — ABNORMAL HIGH (ref 70–99)

## 2012-05-04 LAB — CBC WITH DIFFERENTIAL/PLATELET
Eosinophils Relative: 0 % (ref 0–5)
HCT: 48.2 % (ref 39.0–52.0)
Hemoglobin: 16.8 g/dL (ref 13.0–17.0)
Lymphocytes Relative: 29 % (ref 12–46)
Lymphs Abs: 2.7 10*3/uL (ref 0.7–4.0)
MCV: 93.4 fL (ref 78.0–100.0)
Monocytes Absolute: 0.7 10*3/uL (ref 0.1–1.0)
Monocytes Relative: 7 % (ref 3–12)
Neutro Abs: 6 10*3/uL (ref 1.7–7.7)
RBC: 5.16 MIL/uL (ref 4.22–5.81)
WBC: 9.4 10*3/uL (ref 4.0–10.5)

## 2012-05-04 LAB — URINALYSIS, ROUTINE W REFLEX MICROSCOPIC
Bilirubin Urine: NEGATIVE
Glucose, UA: >1000 mg/dL — AB
Ketones, ur: 15 mg/dL — AB
Leukocytes, UA: NEGATIVE
Nitrite: NEGATIVE
Specific Gravity, Urine: >1.046 — ABNORMAL HIGH (ref 1.005–1.030)
pH: 6 (ref 5.0–8.0)

## 2012-05-04 LAB — GLUCOSE, CAPILLARY
Glucose-Capillary: 280 mg/dL — ABNORMAL HIGH (ref 70–99)
Glucose-Capillary: 246 mg/dL — ABNORMAL HIGH (ref 70–99)

## 2012-05-04 LAB — URINE MICROSCOPIC-ADD ON

## 2012-05-04 LAB — HEPATIC FUNCTION PANEL
Albumin: 3.2 g/dL — ABNORMAL LOW (ref 3.5–5.2)
Alkaline Phosphatase: 72 U/L (ref 39–117)
Total Protein: 6.7 g/dL (ref 6.0–8.3)

## 2012-05-04 MED ORDER — DEXTROSE-NACL 5-0.45 % IV SOLN: INTRAVENOUS | Status: DC

## 2012-05-04 MED ORDER — INSULIN ASPART 100 UNIT/ML ~~LOC~~ SOLN
0.0000 [IU] | Freq: Three times a day (TID) | SUBCUTANEOUS | Status: DC
  Administered 2012-05-05: 5 [IU] via SUBCUTANEOUS
  Administered 2012-05-05: 3 [IU] via SUBCUTANEOUS
  Administered 2012-05-05: 5 [IU] via SUBCUTANEOUS
  Administered 2012-05-06: 09:00:00 via SUBCUTANEOUS

## 2012-05-04 MED ORDER — INSULIN ASPART 100 UNIT/ML ~~LOC~~ SOLN
10.0000 [IU] | Freq: Once | SUBCUTANEOUS | Status: AC
  Administered 2012-05-04: 10 [IU] via SUBCUTANEOUS
  Filled 2012-05-04: qty 1

## 2012-05-04 MED ORDER — FENTANYL CITRATE 0.05 MG/ML IJ SOLN
100.0000 ug | Freq: Once | INTRAMUSCULAR | Status: AC
  Administered 2012-05-04: 100 ug via INTRAVENOUS
  Filled 2012-05-04: qty 2

## 2012-05-04 MED ORDER — ONDANSETRON HCL 4 MG/2ML IJ SOLN: 4.0000 mg | Freq: Four times a day (QID) | INTRAMUSCULAR | Status: DC | PRN

## 2012-05-04 MED ORDER — PANTOPRAZOLE SODIUM 40 MG IV SOLR
40.0000 mg | Freq: Two times a day (BID) | INTRAVENOUS | Status: DC
  Administered 2012-05-05 (×3): 40 mg via INTRAVENOUS
  Filled 2012-05-04 (×5): qty 40

## 2012-05-04 MED ORDER — LOSARTAN POTASSIUM 50 MG PO TABS
100.0000 mg | ORAL_TABLET | Freq: Every day | ORAL | Status: DC
  Administered 2012-05-06 – 2012-05-07 (×2): 100 mg via ORAL
  Filled 2012-05-04 (×3): qty 2

## 2012-05-04 MED ORDER — INSULIN ASPART 100 UNIT/ML ~~LOC~~ SOLN: 12.0000 [IU] | Freq: Two times a day (BID) | SUBCUTANEOUS | Status: DC

## 2012-05-04 MED ORDER — METOCLOPRAMIDE HCL 5 MG/ML IJ SOLN
10.0000 mg | Freq: Four times a day (QID) | INTRAMUSCULAR | Status: DC
  Administered 2012-05-05 – 2012-05-07 (×9): 10 mg via INTRAVENOUS
  Filled 2012-05-04 (×16): qty 2

## 2012-05-04 MED ORDER — OXYCODONE HCL 5 MG PO TABS
30.0000 mg | ORAL_TABLET | ORAL | Status: DC | PRN
  Administered 2012-05-04 – 2012-05-07 (×10): 30 mg via ORAL
  Filled 2012-05-04 (×11): qty 6

## 2012-05-04 MED ORDER — INSULIN REGULAR BOLUS VIA INFUSION
0.0000 [IU] | Freq: Three times a day (TID) | INTRAVENOUS | Status: DC
  Filled 2012-05-04: qty 10

## 2012-05-04 MED ORDER — SODIUM CHLORIDE 0.9 % IV BOLUS (SEPSIS)
1000.0000 mL | Freq: Once | INTRAVENOUS | Status: AC
  Administered 2012-05-04: 1000 mL via INTRAVENOUS

## 2012-05-04 MED ORDER — HYDROMORPHONE HCL PF 1 MG/ML IJ SOLN: 1.0000 mg | INTRAMUSCULAR | Status: DC | PRN

## 2012-05-04 MED ORDER — SODIUM CHLORIDE 0.9 % IV SOLN
INTRAVENOUS | Status: DC
  Filled 2012-05-04: qty 1

## 2012-05-04 MED ORDER — ONDANSETRON HCL 4 MG/2ML IJ SOLN
4.0000 mg | Freq: Once | INTRAMUSCULAR | Status: AC
  Administered 2012-05-04: 4 mg via INTRAVENOUS
  Filled 2012-05-04: qty 2

## 2012-05-04 MED ORDER — OXYCODONE HCL ER 20 MG PO T12A
60.0000 mg | EXTENDED_RELEASE_TABLET | Freq: Two times a day (BID) | ORAL | Status: DC
  Administered 2012-05-05 – 2012-05-07 (×6): 60 mg via ORAL
  Filled 2012-05-04 (×6): qty 3

## 2012-05-04 MED ORDER — SODIUM CHLORIDE 0.9 % IV SOLN
INTRAVENOUS | Status: DC
  Administered 2012-05-04: 20:00:00 via INTRAVENOUS

## 2012-05-04 MED ORDER — ASPIRIN EC 81 MG PO TBEC
81.0000 mg | DELAYED_RELEASE_TABLET | Freq: Every day | ORAL | Status: DC
  Administered 2012-05-05 – 2012-05-07 (×3): 81 mg via ORAL
  Filled 2012-05-04 (×3): qty 1

## 2012-05-04 MED ORDER — HYDROMORPHONE HCL PF 2 MG/ML IJ SOLN
2.0000 mg | Freq: Once | INTRAMUSCULAR | Status: AC
  Administered 2012-05-04: 2 mg via INTRAVENOUS
  Filled 2012-05-04: qty 1

## 2012-05-04 MED ORDER — ACETAMINOPHEN 650 MG RE SUPP: 650.0000 mg | Freq: Four times a day (QID) | RECTAL | Status: DC | PRN

## 2012-05-04 MED ORDER — ONDANSETRON HCL 4 MG/2ML IJ SOLN: 4.0000 mg | Freq: Three times a day (TID) | INTRAMUSCULAR | Status: DC | PRN

## 2012-05-04 MED ORDER — DEXTROSE 50 % IV SOLN: 25.0000 mL | INTRAVENOUS | Status: DC | PRN

## 2012-05-04 MED ORDER — ACETAMINOPHEN 325 MG PO TABS: 650.0000 mg | ORAL_TABLET | Freq: Four times a day (QID) | ORAL | Status: DC | PRN

## 2012-05-04 MED ORDER — HYDROMORPHONE HCL PF 1 MG/ML IJ SOLN
1.0000 mg | INTRAMUSCULAR | Status: DC | PRN
  Administered 2012-05-05 (×2): 2 mg via INTRAVENOUS
  Administered 2012-05-05: 1 mg via INTRAVENOUS
  Administered 2012-05-05: 2 mg via INTRAVENOUS
  Administered 2012-05-05 (×4): 1 mg via INTRAVENOUS
  Administered 2012-05-06: 2 mg via INTRAVENOUS
  Administered 2012-05-06: 1 mg via INTRAVENOUS
  Administered 2012-05-06 (×4): 2 mg via INTRAVENOUS
  Administered 2012-05-06: 1 mg via INTRAVENOUS
  Administered 2012-05-07 (×2): 2 mg via INTRAVENOUS
  Filled 2012-05-04: qty 1
  Filled 2012-05-04 (×5): qty 2
  Filled 2012-05-04 (×5): qty 1
  Filled 2012-05-04 (×2): qty 2
  Filled 2012-05-04: qty 1
  Filled 2012-05-04 (×3): qty 2

## 2012-05-04 MED ORDER — GABAPENTIN 400 MG PO CAPS
400.0000 mg | ORAL_CAPSULE | Freq: Three times a day (TID) | ORAL | Status: DC
  Administered 2012-05-05 – 2012-05-07 (×8): 400 mg via ORAL
  Filled 2012-05-04 (×10): qty 1

## 2012-05-04 MED ORDER — ENOXAPARIN SODIUM 40 MG/0.4ML ~~LOC~~ SOLN
40.0000 mg | SUBCUTANEOUS | Status: DC
  Administered 2012-05-05 – 2012-05-06 (×2): 40 mg via SUBCUTANEOUS
  Filled 2012-05-04 (×3): qty 0.4

## 2012-05-04 MED ORDER — ALUM & MAG HYDROXIDE-SIMETH 200-200-20 MG/5ML PO SUSP
30.0000 mL | Freq: Four times a day (QID) | ORAL | Status: DC | PRN
Start: 2012-05-04 — End: 2012-05-07

## 2012-05-04 MED ORDER — ONDANSETRON HCL 4 MG PO TABS: 4.0000 mg | ORAL_TABLET | Freq: Four times a day (QID) | ORAL | Status: DC | PRN

## 2012-05-04 MED ORDER — HYDROMORPHONE HCL PF 1 MG/ML IJ SOLN
1.0000 mg | Freq: Once | INTRAMUSCULAR | Status: AC
  Administered 2012-05-04: 1 mg via INTRAVENOUS
  Filled 2012-05-04: qty 1

## 2012-05-04 MED ORDER — ALPRAZOLAM 0.5 MG PO TABS
1.0000 mg | ORAL_TABLET | Freq: Two times a day (BID) | ORAL | Status: DC
  Administered 2012-05-04 – 2012-05-07 (×6): 1 mg via ORAL
  Filled 2012-05-04 (×6): qty 2

## 2012-05-04 MED ORDER — DULOXETINE HCL 30 MG PO CPEP
30.0000 mg | ORAL_CAPSULE | Freq: Every day | ORAL | Status: DC
  Administered 2012-05-05 – 2012-05-07 (×3): 30 mg via ORAL
  Filled 2012-05-04 (×3): qty 1

## 2012-05-04 MED ORDER — SODIUM CHLORIDE 0.9 % IV SOLN
INTRAVENOUS | Status: AC
  Administered 2012-05-04 – 2012-05-05 (×2): via INTRAVENOUS

## 2012-05-04 NOTE — ED Notes (Signed)
Pt's CBG is 246.

## 2012-05-04 NOTE — ED Notes (Signed)
Pt states decrease in pain. Pt states does not need to go to bathroom right now. Pt denies nausea currently. Pt mentating appropriately.

## 2012-05-04 NOTE — ED Notes (Signed)
Pt states that he has not vomited since he has been here but he has not drank or eaten anything. Pt is mentating appropriately. Pt states he was coughing up blood earlier.

## 2012-05-04 NOTE — ED Notes (Signed)
Pt c/o LUQ pain and hemoptysis x several days; pt seen multiple times in past for same

## 2012-05-04 NOTE — ED Notes (Signed)
Pt requesting more pain medication edp informed

## 2012-05-04 NOTE — ED Notes (Signed)
Dr Beaton at bedside 

## 2012-05-04 NOTE — ED Notes (Signed)
Pt c/o N/V/D. Pt states has been vomiting up with streaks of blood for past few days. Pt states his diabetes is out of control. Pt mentating appropriately. Pt states "just in a lot of pain."

## 2012-05-04 NOTE — ED Provider Notes (Addendum)
History     CSN: [id number]  Arrival date & time 05/04/12  1131   First MD Initiated Contact with Patient 05/04/12 1242      Chief Complaint  Patient presents with  . Abdominal Pain  . Hemoptysis     HPI Patient comes in with persistent abdominal pain associated with nausea and vomiting.  Evaluated and seen numerous times for similar complaints in the past.  Has known history of diabetes and gastroparesis.  Patient denies fever chills.  Has recently been trying Reglan home without relief. Past Medical History  Diagnosis Date  . Hypertension   . DVT (deep venous thrombosis) ~ 2011    BLE  . 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 Hyperlipidemia   . CAD (coronary atherosclerotic disease)     s/p stents x2  . Small bowel obstruction   . Pancreatitis   . Anxiety   . GERD (gastroesophageal reflux disease)   . CHF (congestive heart failure)   . Myocardial infarction ~ 2009  . Anginal pain   . Pneumonia ~ 2012  . Shortness of breath     "lying down & w/exertion" (03/04/2012)  . Type II diabetes mellitus   . Hep B w/o coma 1970's    "I was in [place]" (03/04/2012)  . Chronic lower back pain   . PTSD (post-traumatic stress disorder)     Past Surgical History  Procedure Date  . Esophagogastroduodenoscopy 09/22/2011    Procedure: ESOPHAGOGASTRODUODENOSCOPY (EGD);  Surgeon: Charna Elizabeth, MD;  Location: [facility] ENDOSCOPY;  Service: Endoscopy;  Laterality: N/A;  . Esophagogastroduodenoscopy 01/05/2012    Procedure: ESOPHAGOGASTRODUODENOSCOPY (EGD);  Surgeon: Rachael Fee, MD;  Location: [facility] ENDOSCOPY;  Service: Endoscopy;  Laterality: N/A;  . Cholecystectomy ~ 2009  . Spinal growth rods ~ 2011  . Tonsillectomy and adenoidectomy   . Coronary angioplasty with stent placement ~ 2009`    "1 + ! (after 1st one failed)" (03/04/2012)    Family History  Problem Relation Age of Onset  . Diabetes Father   . Colon cancer Neg Hx     History  Substance Use Topics  .  Smoking status: Current Every Day Smoker -- 0.5 packs/day for 38 years    Types: Cigarettes  . Smokeless tobacco: Never Used  . 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 ALPRAZOLAM 1 MG PO TABS   Oral   Take 1 tablet (1 mg total) by mouth 2 (two) times daily.   4 tablet   0   . ASPIRIN EC 81 MG PO TBEC   Oral   Take 81 mg by mouth daily.         . DULOXETINE HCL 30 MG PO CPEP   Oral   Take 1 capsule (30 mg total) by mouth daily.         Marland Kitchen GABAPENTIN 400 MG PO CAPS   Oral   Take 1 capsule (400 mg total) by mouth 3 (three) times daily.   30 capsule   0   . GLIPIZIDE ER 10 MG PO TB24   Oral   Take 10  mg by mouth 2 (two) times daily.         . INSULIN LISPRO PROT & LISPRO (75-25) 100 UNIT/ML [place] SUSP   Subcutaneous   Inject 12 Units into the skin 2 (two) times daily with a meal.         . LACTULOSE 10 GM/15ML PO SOLN   Oral   Take 20 g by mouth 2 (two) times daily.         Marland Kitchen LOSARTAN POTASSIUM 100 MG PO TABS   Oral   Take 100 mg by mouth daily.         Marland Kitchen METFORMIN HCL 850 MG PO TABS   Oral   Take 850 mg by mouth 3 (three) times daily.         Marland Kitchen METOCLOPRAMIDE HCL 10 MG PO TABS   Oral   Take 10 mg by mouth every 6 (six) hours as needed. For nausea         . ONDANSETRON HCL 4 MG PO TABS   Oral   Take 4 mg by mouth every 6 (six) hours as needed. For nausea         . OXYCODONE HCL 30 MG PO TABS   Oral   Take 1 tablet (30 mg total) by mouth every 4 (four) hours as needed. For breakthrough pain   12 tablet   0   . OXYCODONE HCL 30 MG PO TABS   Oral   Take 1 tablet (30 mg total) by mouth every 4 (four) hours as needed (for breakthrough pain).   8 tablet    0   . OXYCODONE HCL ER 60 MG PO T12A   Oral   Take 1 tablet by mouth every 12 (twelve) hours.   8 each   0   . OXYCODONE-ACETAMINOPHEN 5-325 MG PO TABS   Oral   Take 1 tablet by mouth every 4 (four) hours as needed for pain.   8 tablet   0   . PANTOPRAZOLE SODIUM 40 MG PO TBEC   Oral   Take 1 tablet (40 mg total) by mouth daily.   30 tablet   0   . ZANTAC PO   Oral   Take 300 mg by mouth 2 (two) times daily.          . SENNA-DOCUSATE SODIUM 8.6-50 MG PO TABS   Oral   Take 2 tablets by mouth daily. For constipation   60 tablet   0     BP 132/74  Pulse 90  Temp 99 F (37.2 C) (Oral)  Resp 20  SpO2 96%  Physical Exam  Nursing note and vitals reviewed. Constitutional: He is oriented to person, place, and time. He appears well-developed and well-nourished. No distress.  HENT:  Head: Normocephalic and atraumatic.  Mouth/Throat: Mucous membranes are dry.  Eyes: Pupils are equal, round, and reactive to light.  Neck: Normal range of motion.  Cardiovascular: Normal rate and intact distal pulses.   Pulmonary/Chest: No respiratory distress.  Abdominal: Normal appearance. He exhibits no distension. There is no tenderness. There is no rebound.  Musculoskeletal: Normal range of motion.  Neurological: He is alert and oriented to person, place, and time. No cranial nerve deficit.  Skin: Skin is warm and dry. No rash noted.  Psychiatric: He has a normal mood and affect. His behavior is normal.    ED Course  Procedures (including critical care time)  Medications  dextrose 5 %-0.45 % sodium chloride infusion (not administered)  insulin regular bolus via infusion 0-10 Units (not administered)  insulin regular (NOVOLIN R,HUMULIN R) 1 Units/mL in sodium chloride 0.9 % 100 mL infusion (not administered)  dextrose 50 % solution 25 mL (not administered)  0.9 %  sodium chloride infusion (not administered)  sodium chloride 0.9 % bolus 1,000 mL (0 mL Intravenous Stopped 05/04/12  1758)  fentaNYL (SUBLIMAZE) injection 100 mcg (100 mcg Intravenous Given 05/04/12 1638)  ondansetron (ZOFRAN) injection 4 mg (4 mg Intravenous Given 05/04/12 1636)  HYDROmorphone (DILAUDID) injection 1 mg (1 mg Intravenous Given 05/04/12 1759)    Labs Reviewed  BASIC METABOLIC PANEL - Abnormal; Notable for the following:    Glucose, Bld 369 (*)     All other components within normal limits  GLUCOSE, CAPILLARY - Abnormal; Notable for the following:    Glucose-Capillary 350 (*)     All other components within normal limits  URINALYSIS, ROUTINE W REFLEX MICROSCOPIC - Abnormal; Notable for the following:    APPearance CLOUDY (*)     Specific Gravity, Urine >1.046 (*)     Glucose, UA >1000 (*)     Ketones, ur 15 (*)     Protein, ur 30 (*)     All other components within normal limits  URINE MICROSCOPIC-ADD ON - Abnormal; Notable for the following:    Crystals CA OXALATE CRYSTALS (*)     All other components within normal limits  CBC WITH DIFFERENTIAL   Dg Chest 2 View  05/04/2012  *RADIOLOGY REPORT*  Clinical Data: Hemoptysis.  CHEST - 2 VIEW  Comparison: April 27, 2012.  Findings: Cardiomediastinal silhouette appears normal. Interstitial densities are noted in both lung bases which are unchanged and consistent with scarring.  No acute pulmonary disease is noted.  IMPRESSION: No acute cardiopulmonary abnormality seen.   Original Report Authenticated By: Lupita Raider.,  M.D.      1. Nausea and vomiting   2. Dehydration       MDM          Nelia Shi, MD 05/04/12 1926  Nelia Shi, MD 05/04/12 [phone]

## 2012-05-04 NOTE — ED Notes (Signed)
Internal Medicine at bedside.  

## 2012-05-04 NOTE — H&P (Signed)
[facility] History and Physical  Ryan [name] JXB:[id number] DOB: 04/11/1951 DOA: 05/04/2012  Referring physician: Dr. Radford Pax PCP: Warrick Parisian, MD  Specialists: None  Chief Complaint: Nausea vomiting and abdominal pain  HPI: Ryan [name] is a 62 y.o. male with history of hypertension, insulin requiring diabetes, morbid obesity, coronary artery disease status post PCI, chronic abdominal pain with chronic pain syndrome requiring chronic narcotics, history of pancreatitis, status post cholecystectomy, anxiety who presents to the ED with a 6-7 day history of nausea, emesis, worsening abdominal pain. Patient does endorse a fever of 102 with some chills a brownish cough. Patient also endorses some generalized weakness. Patient states he'll PCP 2 weeks ago was placed on Reglan with some improvement in his symptoms however symptoms came back. Patient also endorses some diarrhea with dark. Patient states diarrhea is watery and has about 4/5 per day over the past 2-3 days. Patient states was treated with antibiotics for bronchitis 2-3 weeks prior to admission. Patient denies any alcohol use. Patient presented to the ED on Christmas Day per patient with similar symptoms. Patient was seen in the ED chest x-ray which was done was unremarkable. Blood glucose level was 350. Basic metabolic profile done was unremarkable. CBC done was unremarkable. Urinalysis which was done did show a specific gravity greater than 1.046, greater than 1000 glucose, 15 ketones, 30 proteins. Will call to admit the patient for further evaluation and management. Patient states unable to keep anything down.  Review of Systems: The patient denies anorexia, fever, weight loss,, vision loss, decreased hearing, hoarseness, chest pain, syncope, dyspnea on exertion, peripheral edema, balance deficits, hemoptysis, abdominal pain, melena, hematochezia, severe indigestion/heartburn, hematuria, incontinence, genital sores, muscle  weakness, suspicious skin lesions, transient blindness, difficulty walking, depression, unusual weight change, abnormal bleeding, enlarged lymph nodes, angioedema, and breast masses.   Past Medical History  Diagnosis Date  . Hypertension   . DVT (deep venous thrombosis) ~ 2011    BLE  . 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 Hyperlipidemia   . CAD (coronary atherosclerotic disease)     s/p stents x2  . Small bowel obstruction   . Pancreatitis   . Anxiety   . GERD (gastroesophageal reflux disease)   . CHF (congestive heart failure)   . Myocardial infarction ~ 2009  . Anginal pain   . Pneumonia ~ 2012  . Shortness of breath     "lying down & w/exertion" (03/04/2012)  . Type II diabetes mellitus   . Hep B w/o coma 1970's    "I was in [place]" (03/04/2012)  . Chronic lower back pain   . PTSD (post-traumatic stress disorder)    Past Surgical History  Procedure Date  . Esophagogastroduodenoscopy 09/22/2011    Procedure: ESOPHAGOGASTRODUODENOSCOPY (EGD);  Surgeon: Charna Elizabeth, MD;  Location: [facility] ENDOSCOPY;  Service: Endoscopy;  Laterality: N/A;  . Esophagogastroduodenoscopy 01/05/2012    Procedure: ESOPHAGOGASTRODUODENOSCOPY (EGD);  Surgeon: Rachael Fee, MD;  Location: [facility] ENDOSCOPY;  Service: Endoscopy;  Laterality: N/A;  . Cholecystectomy ~ 2009  . Spinal growth rods ~ 2011  . Tonsillectomy and adenoidectomy   . Coronary angioplasty with stent placement ~ 2009`    "1 + ! (after 1st one failed)" (03/04/2012)   Social History:  reports that he has been smoking Cigarettes.  He has a 19 pack-year smoking history. He has never used smokeless tobacco. He reports that he drinks alcohol. He reports that he uses illicit drugs (Marijuana).  Allergies  Allergen  Reactions  . Lisinopril Swelling and Rash  . Morphine And Related Swelling and Rash    Family History  Problem Relation Age of Onset  . Diabetes Father   . Colon cancer Neg Hx     Prior to Admission  medications   Medication Sig Start Date End Date Taking? Authorizing Provider  ALPRAZolam Prudy Feeler) 1 MG tablet Take 1 tablet (1 mg total) by mouth 2 (two) times daily. 01/06/12  Yes Cristal Ford, MD  aspirin EC 81 MG tablet Take 81 mg by mouth daily.   Yes Historical Provider, MD  DULoxetine (CYMBALTA) 30 MG capsule Take 1 capsule (30 mg total) by mouth daily. 11/04/11 11/03/12 Yes Shanker Levora Dredge, MD  gabapentin (NEURONTIN) 400 MG capsule Take 1 capsule (400 mg total) by mouth 3 (three) times daily. 03/06/12  Yes Shanker Levora Dredge, MD  glipiZIDE (GLUCOTROL XL) 10 MG 24 hr tablet Take 10 mg by mouth 2 (two) times daily.   Yes Historical Provider, MD  insulin lispro protamine-insulin lispro (HUMALOG 75/25) (75-25) 100 UNIT/ML SUSP Inject 12 Units into the skin 2 (two) times daily with a meal.   Yes Historical Provider, MD  lactulose (CHRONULAC) 10 GM/15ML solution Take 20 g by mouth 2 (two) times daily.   Yes Historical Provider, MD  losartan (COZAAR) 100 MG tablet Take 100 mg by mouth daily.   Yes Historical Provider, MD  metFORMIN (GLUCOPHAGE) 850 MG tablet Take 850 mg by mouth 3 (three) times daily.   Yes Historical Provider, MD  metoCLOPramide (REGLAN) 10 MG tablet Take 10 mg by mouth every 6 (six) hours as needed. For nausea 02/05/12  Yes Flint Melter, MD  ondansetron (ZOFRAN) 4 MG tablet Take 4 mg by mouth every 6 (six) hours as needed. For nausea 01/23/12  Yes Tiffany Irine Seal, PA  oxycodone (ROXICODONE) 30 MG immediate release tablet Take 1 tablet (30 mg total) by mouth every 4 (four) hours as needed. For breakthrough pain 03/06/12  Yes Shanker Levora Dredge, MD  oxycodone (ROXICODONE) 30 MG immediate release tablet Take 1 tablet (30 mg total) by mouth every 4 (four) hours as needed (for breakthrough pain). 04/27/12  Yes Jimmye Norman, NP  OxyCODONE HCl ER 60 MG T12A Take 1 tablet by mouth every 12 (twelve) hours. 04/27/12  Yes Jimmye Norman, NP  oxyCODONE-acetaminophen (PERCOCET/ROXICET) 5-325  MG per tablet Take 1 tablet by mouth every 4 (four) hours as needed for pain. 04/27/12  Yes Jimmye Norman, NP  pantoprazole (PROTONIX) 40 MG tablet Take 1 tablet (40 mg total) by mouth daily. 03/06/12 03/06/13 Yes Shanker Levora Dredge, MD  Ranitidine HCl (ZANTAC PO) Take 300 mg by mouth 2 (two) times daily.    Yes Historical Provider, MD  sennosides-docusate sodium (SENOKOT-S) 8.6-50 MG tablet Take 2 tablets by mouth daily. For constipation 03/06/12  Yes Shanker Levora Dredge, MD   Physical Exam: Filed Vitals:   05/04/12 1343 05/04/12 1502 05/04/12 1600 05/04/12 2038  BP: 132/76 137/85 132/74 131/62  Pulse: 81 89 90 77  Temp:      TempSrc:      Resp: 20 20  16   SpO2: 96% 98% 96% 97%     General:  Obese, well-developed well-nourished in no acute cardiopulmonary distress.  Eyes: Pupils equal round and reactive to light and accommodation. Extraocular movements intact.  ENT: Oropharynx is clear, no lesions, no exudates. Dry mucous membranes.  Neck: Supple with no lymphadenopathy.  Cardiovascular: Regular rate rhythm no murmurs rubs or  gallops. Distant heart sounds.  Respiratory: Clear to auscultation bilaterally. No wheezes, no crackles, no rhonchi.  Abdomen: Soft, nondistended, tender to palpation the left upper quadrant, positive bowel sounds.  Skin: No rashes or lesions.  Musculoskeletal: 5 out of 5 bilateral upper extremity strength. 5 bilateral lower extremity strength.  Psychiatric: Normal mood. Normal affect. Fair judgment. Fair insight.  Neurologic: Alert and oriented x3. Cranial nerves II through XII are grossly intact. No focal deficits.  Labs on Admission:  Basic Metabolic Panel:  Lab 05/04/12 4540  NA 140  K 4.0  CL 103  CO2 23  GLUCOSE 369*  BUN 9  CREATININE 0.65  CALCIUM 10.0  MG --  PHOS --   Liver Function Tests: No results found for this basename: AST:5,ALT:5,ALKPHOS:5,BILITOT:5,PROT:5,ALBUMIN:5 in the last 168 hours No results found for this basename:  LIPASE:5,AMYLASE:5 in the last 168 hours No results found for this basename: AMMONIA:5 in the last 168 hours CBC:  Lab 05/04/12 1147  WBC 9.4  NEUTROABS 6.0  HGB 16.8  HCT 48.2  MCV 93.4  PLT 265   Cardiac Enzymes: No results found for this basename: CKTOTAL:5,CKMB:5,CKMBINDEX:5,TROPONINI:5 in the last 168 hours  BNP (last 3 results)  Basename 04/27/12 0951  PROBNP 78.2   CBG:  Lab 05/04/12 1951 05/04/12 1147  GLUCAP 280* 350*    Radiological Exams on Admission: Dg Chest 2 View  05/04/2012  *RADIOLOGY REPORT*  Clinical Data: Hemoptysis.  CHEST - 2 VIEW  Comparison: April 27, 2012.  Findings: Cardiomediastinal silhouette appears normal. Interstitial densities are noted in both lung bases which are unchanged and consistent with scarring.  No acute pulmonary disease is noted.  IMPRESSION: No acute cardiopulmonary abnormality seen.   Original Report Authenticated By: Lupita Raider.,  M.D.     EKG: None  Assessment/Plan Principal Problem:  *Nausea and vomiting Active Problems:  DIABETES MELLITUS, TYPE II  HYPERTENSION  GERD  Abdominal pain  Narcotic dependence  Dehydration  Diarrhea   #1 acute on chronic abdominal pain, nausea, vomiting. Patient has had multiple visits to the ED over the past 8 months. Patient has already had approximately 12 CT of the abdomen and pelvis so far. In May of 2013 patient had an EGD that showed esophagitis and also the GE junction. Followup EGD on 9/13 was normal. Gastric emptying scan done on 613 was normal. Patient denies any NSAID use. Will admit the patient under observation to a MedSurg bed. Check a KUB. Check a lipase. Check an acute hepatic panel. Will check stool for C. difficile PCR, ova and parasites, stool cultures. We'll keep n.p.o. Hydrate with IV fluids. Place on Reglan 10 mg IV every 6 hours. IV PPI. Antiemetics, pain medication, supportive care. Follow. If no significant improvement may need a GI evaluation for further  recommendations.  #2 dehydration Secondary to GI losses. Hydrated with IV fluids. Supportive care.  #3 diabetes mellitus Patient unable to take his oral hypoglycemic agents secondary to problem #1. We'll place on a sliding scale insulin. Continue home regimen of long-acting insulin. Check a hemoglobin A1c. Follow.  #4 chronic back pain Resume home regimen of pain medications.  #5 diarrhea Check stool for C. difficile PCR. Check stool for O. and parasites. Check stool culture. Will lactulose and stool softener. Supportive care.  #6 history of coronary artery disease/PCI Patient would EKG done on 04/30/2012 which is unchanged. Continue home regimen of aspirin.  #7 gastroesophageal reflux disease PPI.  #8 prophylaxis PPI for GI prophylaxis. Lovenox for DVT prophylaxis.  Code Status: Full Family Communication: Updated patient no family at bedside Disposition Plan: Home when medically stable  Time spent: 60 minutes  [facility] Pager [phone]  If 7PM-7AM, please contact night-coverage [email or web address] Password [facility] 05/04/2012, 8:43 PM

## 2012-05-04 NOTE — ED Notes (Signed)
Report given to floor nurse, Deniece Portela, Charity fundraiser. Nurse has no further questions upon report given. Pt being prepared for transport to floor.

## 2012-05-04 NOTE — ED Notes (Signed)
Holding insulin drip due to verbal order from internal medicine at bedside. CBG 280

## 2012-05-04 NOTE — ED Notes (Signed)
Pt being transported to floor by Tory Emerald, EMT

## 2012-05-05 LAB — BASIC METABOLIC PANEL
CO2: 22 mEq/L (ref 19–32)
Calcium: 9 mg/dL (ref 8.4–10.5)
GFR calc non Af Amer: >90 mL/min (ref >90)
Sodium: 139 mEq/L (ref 135–145)

## 2012-05-05 LAB — GLUCOSE, CAPILLARY
Glucose-Capillary: 156 mg/dL — ABNORMAL HIGH (ref 70–99)
Glucose-Capillary: 226 mg/dL — ABNORMAL HIGH (ref 70–99)

## 2012-05-05 LAB — CBC
MCV: 95.7 fL (ref 78.0–100.0)
Platelets: 243 10*3/uL (ref 150–400)
RBC: 4.85 MIL/uL (ref 4.22–5.81)
WBC: 8.1 10*3/uL (ref 4.0–10.5)

## 2012-05-05 LAB — MAGNESIUM: Magnesium: 1.9 mg/dL (ref 1.5–2.5)

## 2012-05-05 LAB — HEMOGLOBIN A1C: Hgb A1c MFr Bld: 12.3 % — ABNORMAL HIGH (ref <5.7)

## 2012-05-05 MED ORDER — POLYETHYLENE GLYCOL 3350 17 G PO PACK
17.0000 g | PACK | Freq: Three times a day (TID) | ORAL | Status: DC
  Administered 2012-05-05 (×2): 17 g via ORAL
  Filled 2012-05-05 (×5): qty 1

## 2012-05-05 MED ORDER — INSULIN ASPART PROT & ASPART (70-30 MIX) 100 UNIT/ML ~~LOC~~ SUSP
10.0000 [IU] | Freq: Two times a day (BID) | SUBCUTANEOUS | Status: DC
  Administered 2012-05-05 – 2012-05-07 (×4): 10 [IU] via SUBCUTANEOUS
  Filled 2012-05-05 (×2): qty 3

## 2012-05-05 NOTE — Evaluation (Signed)
Physical Therapy Evaluation Patient Details Name: Ryan [name] MRN: [id number] DOB: [birth year] Today's Date: 05/05/2012 Time: 0865-7846 PT Time Calculation (min): 41 min  PT Assessment / Plan / Recommendation Clinical Impression  Pt is a 62 y/o male admitted for nausea and vomitting. Pt chief c/o generalized weakness. Pt able to perform all functional task with no physical assistance. Pt reports living at in a secluded mobile home with a house mate who is unable to assist him. Acute PT will continue to follow pt to ensure ability of pt to safely return to home    PT Assessment  Patient needs continued PT services    Follow Up Recommendations  No PT follow up       Barriers to Discharge Decreased caregiver support no assistance available at home    Equipment Recommendations  None recommended by PT    Recommendations for Other Services     Frequency Min 3X/week    Precautions / Restrictions Precautions Precautions: Fall Restrictions Weight Bearing Restrictions: No   Pertinent Vitals/Pain Pt c/o 6/10 pain in abdomen.  RN provided pt with pain medication at start of session.        Mobility  Bed Mobility Bed Mobility: Supine to Sit;Sit to Supine Supine to Sit: 6: Modified independent (Device/Increase time);HOB elevated Sit to Supine: 6: Modified independent (Device/Increase time);HOB flat;With rail Details for Bed Mobility Assistance: No assistance or instruction required.  Transfers Transfers: Sit to Stand;Stand to Sit Sit to Stand: 5: Supervision;From bed;With upper extremity assist Stand to Sit: 5: Supervision;To chair/3-in-1;With upper extremity assist Details for Transfer Assistance: Cues for hand placement, supervision for safety.   Ambulation/Gait Ambulation/Gait Assistance: 5: Supervision Ambulation Distance (Feet): 100 Feet Assistive device: Rolling walker Ambulation/Gait Assistance Details: supervision for safety . No assist or instruction required. Gait  Pattern: Within Functional Limits Stairs: Yes Stairs Assistance: 5: Supervision Stairs Assistance Details (indicate cue type and reason): supervision for safety Stair Management Technique: One rail Right Number of Stairs: 4     Shoulder Instructions     Exercises Total Joint Exercises Ankle Circles/Pumps: Both;10 reps;Seated   PT Diagnosis: Generalized weakness;Acute pain  PT Problem List: Decreased mobility;Pain PT Treatment Interventions: Gait training;DME instruction;Stair training;Functional mobility training;Therapeutic activities;Therapeutic exercise;Patient/family education   PT Goals Acute Rehab PT Goals PT Goal Formulation: With patient Time For Goal Achievement: 05/12/12 Potential to Achieve Goals: Good Pt will go Supine/Side to Sit: Independently;with HOB 0 degrees PT Goal: Supine/Side to Sit - Progress: Goal set today Pt will go Sit to Supine/Side: Independently;with HOB 0 degrees PT Goal: Sit to Supine/Side - Progress: Goal set today Pt will Transfer Bed to Chair/Chair to Bed: Independently PT Transfer Goal: Bed to Chair/Chair to Bed - Progress: Goal set today Pt will Ambulate: >150 feet;with modified independence;with least restrictive assistive device PT Goal: Ambulate - Progress: Goal set today Pt will Go Up / Down Stairs: 6-9 stairs;with modified independence;with least restrictive assistive device;with rail(s) PT Goal: Up/Down Stairs - Progress: Goal set today  Visit Information  Last PT Received On: 05/05/12    Subjective Data  Subjective:  I dont know if I feel safe by myself at home. Agree to PT eval  Patient Stated Goal: Get around safely.     Prior Functioning  Home Living Lives With: Friend(s) Available Help at Discharge: Other (Comment) (no available help) Type of Home: Mobile home Home Access: Stairs to enter Entrance Stairs-Number of Steps: 8 Entrance Stairs-Rails: Right Home Layout: One level Home Adaptive Equipment: Walker - rolling  Prior  Function Level of Independence: Independent with assistive device(s) Able to Take Stairs?: Yes Communication Communication: No difficulties    Cognition  Overall Cognitive Status: Appears within functional limits for tasks assessed/performed Arousal/Alertness: Lethargic Orientation Level: Appears intact for tasks assessed Behavior During Session: [facility] for tasks performed    Extremity/Trunk Assessment Right Upper Extremity Assessment RUE ROM/Strength/Tone: Within functional levels Left Upper Extremity Assessment LUE ROM/Strength/Tone: Within functional levels Right Lower Extremity Assessment RLE ROM/Strength/Tone: Within functional levels RLE Sensation: WFL - Light Touch RLE Coordination: WFL - gross motor Left Lower Extremity Assessment LLE ROM/Strength/Tone: WFL for tasks assessed LLE Sensation: WFL - Proprioception;WFL - Light Touch LLE Coordination: WFL - gross motor Trunk Assessment Trunk Assessment: Normal   Balance    End of Session PT - End of Session Equipment Utilized During Treatment: Gait belt Activity Tolerance: Patient tolerated treatment well Patient left: in chair;with call bell/phone within reach Nurse Communication: Mobility status  GP Functional Assessment Tool Used: clinical judment.  Functional Limitation: Mobility: Walking and moving around Mobility: Walking and Moving Around Current Status [phone]): At least 1 percent but less than 20 percent impaired, limited or restricted Mobility: Walking and Moving Around Goal Status [phone]): 0 percent impaired, limited or restricted   Colleena Kurtenbach 05/05/2012, 12:12 PM  Gurshaan Matsuoka L. Zaccheus Edmister DPT [phone]

## 2012-05-05 NOTE — Progress Notes (Signed)
PATIENT DETAILS Name: Ryan [name] Age: 62 y.o. Sex: male Date of Birth: 06-30-1950 Admit Date: 05/04/2012 Admitting Physician Rodolph Bong, MD ZOX:[id number],[id number], MD  Subjective: Doing slightly better than yesterday, continues to have abdominal pain (history of chronic pain) has tolerated clear liquids. Per RN no vomiting. Patient passing flatus.  Assessment/Plan: Principal Problem:  *Nausea and vomiting with abdominal pain - This is a recurrent issue for this patient - He has had numerous recent admissions for similar reasons, has had numerous CT scans, and endoscopies in the past. - Will continue with conservative medical management, x-rays of his abdomen shows? Partial small bowel obstruction with some stool burden-he is tolerating liquids, will start scheduled MiraLax and Senokot. - He is passing flatus, hopefully he will respond to conservative medical management. Otherwise will consult [facility]. - He is on chronic narcotic therapy, will need to minimize narcotics while he is in the hospital. - His abdominal exam is benign, abdomen is soft-patient is obese he has some mild tenderness mostly in his upper abdomen. There is no rebound, rigidity or guarding. - He claims to have diarrhea, however has not had any stools today. X-ray of his abdomen done yesterday showed stool in the colon - Repeat abdominal x-ray in the morning  Active Problems:  DIABETES MELLITUS, TYPE II - CBGs with moderate control. A1c is 12.3. - Start insulin 70/30 at 10 units twice a day, slowly increase as his diet is advanced   HYPERTENSION - Controlled with losartan   GERD - Continue with PPI   Narcotic dependence - As above   Dehydration - Better with IV fluids. Slowly taper off IV fluids as his diet advances   Diarrhea - Seems to have resolved, none today at all. Patient today feels constipated. Starting MiraLax.  Disposition: Remain inpatient  DVT Prophylaxis: Prophylactic Lovenox    Code Status: Full code   Procedures:  None  CONSULTS:  None  PHYSICAL EXAM: Vital signs in last 24 hours: Filed Vitals:   05/04/12 2309 05/05/12 0233 05/05/12 0643 05/05/12 1300  BP: 153/73 129/61 134/78 127/81  Pulse: 71 73 74 71  Temp: 97.7 F (36.5 C) 97.5 F (36.4 C) 97.6 F (36.4 C) 97.6 F (36.4 C)  TempSrc: Oral Oral Oral   Resp: 20 18 20 18   Weight: 137.3 kg (302 lb 11.1 oz)     SpO2: 100% 97% 92% 100%    Weight change:  There is no height on file to calculate BMI.   Gen Exam: Awake and alert with clear speech.   Neck: Supple, No JVD.   Chest: B/L Clear.   CVS: S1 S2 Regular, no murmurs.  Abdomen: soft, BS +, mild upper abd tenderness, non distended.No rebound or rigidity Extremities: no edema, lower extremities warm to touch. Neurologic: Non Focal.   Skin: No Rash.   Wounds: N/A.    Intake/Output from previous day:  Intake/Output Summary (Last 24 hours) at 05/05/12 1535 Last data filed at 05/05/12 0500  Gross per 24 hour  Intake   1750 ml  Output    125 ml  Net   1625 ml     LAB RESULTS: CBC  Lab 05/05/12 0630 05/04/12 1147  WBC 8.1 9.4  HGB 15.0 16.8  HCT 46.4 48.2  PLT 243 265  MCV 95.7 93.4  MCH 30.9 32.6  MCHC 32.3 34.9  RDW 13.4 13.2  LYMPHSABS -- 2.7  MONOABS -- 0.7  EOSABS -- 0.0  BASOSABS -- 0.0  BANDABS -- --  Chemistries   Lab 05/05/12 0630 05/04/12 1147  NA 139 140  K 4.0 4.0  CL 104 103  CO2 22 23  GLUCOSE 239* 369*  BUN 10 9  CREATININE 0.74 0.65  CALCIUM 9.0 10.0  MG 1.9 --    CBG:  Lab 05/05/12 1135 05/05/12 0649 05/04/12 2259 05/04/12 2140 05/04/12 1951  GLUCAP 201* 233* 257* 246* 280*    GFR The CrCl is unknown because both a height and weight (above a minimum accepted value) are required for this calculation.  Coagulation profile No results found for this basename: INR:5,PROTIME:5 in the last 168 hours  Cardiac Enzymes No results found for this basename:  CK:3,CKMB:3,TROPONINI:3,MYOGLOBIN:3 in the last 168 hours  No components found with this basename: POCBNP:3 No results found for this basename: DDIMER:2 in the last 72 hours  Basename 05/05/12 0630  HGBA1C 12.3*   No results found for this basename: CHOL:2,HDL:2,LDLCALC:2,TRIG:2,CHOLHDL:2,LDLDIRECT:2 in the last 72 hours  Basename 05/05/12 0630  TSH 2.664  T4TOTAL --  T3FREE --  THYROIDAB --   No results found for this basename: VITAMINB12:2,FOLATE:2,FERRITIN:2,TIBC:2,IRON:2,RETICCTPCT:2 in the last 72 hours  Basename 05/04/12 2031  LIPASE 51  AMYLASE --    Urine Studies No results found for this basename: UACOL:2,UAPR:2,USPG:2,UPH:2,UTP:2,UGL:2,UKET:2,UBIL:2,UHGB:2,UNIT:2,UROB:2,ULEU:2,UEPI:2,UWBC:2,URBC:2,UBAC:2,CAST:2,CRYS:2,UCOM:2,BILUA:2 in the last 72 hours  MICROBIOLOGY: No results found for this or any previous visit (from the past 240 hour(s)).  RADIOLOGY STUDIES/RESULTS: Dg Chest 2 View  05/04/2012  *RADIOLOGY REPORT*  Clinical Data: Hemoptysis.  CHEST - 2 VIEW  Comparison: April 27, 2012.  Findings: Cardiomediastinal silhouette appears normal. Interstitial densities are noted in both lung bases which are unchanged and consistent with scarring.  No acute pulmonary disease is noted.  IMPRESSION: No acute cardiopulmonary abnormality seen.   Original Report Authenticated By: Lupita Raider.,  M.D.    Dg Chest 2 View  04/27/2012  *RADIOLOGY REPORT*  Clinical Data: Shortness of breath, cough  CHEST - 2 VIEW  Comparison: 04/22/2012  Findings: Normal heart size and stable prominent central vascularity.  Slight diffuse interstitial changes, unchanged.  No definite superimposed edema or pneumonia.  Remote left rib fractures evident.  Coronary stents identified.  Trachea is midline.  No collapse, consolidation, effusion or pneumothorax.  IMPRESSION: Stable chronic changes.  No superimposed acute process   Original Report Authenticated By: Judie Petit. Miles Costain, M.D.    Abd 1 View  (kub)  05/05/2012  *RADIOLOGY REPORT*  Clinical Data: Nausea and abdominal pain.  ABDOMEN - 1 VIEW  Comparison: Chest and two views abdomen 04/22/2012.  CT abdomen and pelvis 03/28/2012.  Findings: There is gaseous distention of small bowel loops measuring up to 4.9 cm.  There is some gas and stool in the colon. Cholecystectomy clips and spinal stabilization hardware noted.  IMPRESSION: Abnormal bowel gas pattern compatible with partial or early complete small bowel obstruction.   Original Report Authenticated By: Holley Dexter, M.D.    Ct Angio Chest Pe W/cm &/or Wo Cm  04/27/2012  *RADIOLOGY REPORT*  Clinical Data: Short of breath  CT ANGIOGRAPHY CHEST  Technique:  Multidetector CT imaging of the chest using the standard protocol during bolus administration of intravenous contrast. Multiplanar reconstructed images including MIPs were obtained and reviewed to evaluate the vascular anatomy.  Contrast: OMNIPAQUE IOHEXOL 350 MG/ML SOLN  Comparison: 11/13/2010  Findings: No filling defects in the pulmonary arterial tree to suggest acute pulmonary thromboembolism.  Ill-defined left thyroid lesion is not significantly changed.  Stent in the left anterior descending coronary artery. Aberrant right subclavian artery,  a normal variant.  Small scattered mediastinal nodes are improved.  Pericardial effusion.  No pleural effusion.  No pneumothorax.  Patchy bilateral ground-glass opacities without a particular predilection are noted.  Areas of interstitial prominence are also noted.  Chronic left-sided rib deformities.  No definite acute rib fracture.  IMPRESSION: No evidence of acute pulmonary thromboembolism.  Stable ill-defined left thyroid hypodensity.  Patchy ground-glass opacities as described worrisome for inflammatory process.   Original Report Authenticated By: Jolaine Click, M.D.    Dg Abd Acute W/chest  04/22/2012  *RADIOLOGY REPORT*  Clinical Data: Shortness of breath, cough, mid abdominal pain,  nausea, vomiting, and diarrhea  ACUTE ABDOMEN SERIES (ABDOMEN 2 VIEW & CHEST 1 VIEW)  Comparison: Chest and acute abdomen of 03/04/2012 and CT abdomen pelvis of 03/28/2012  Findings: Prominent lung markings remain which appear chronic, as well as changes of chronic bronchitis.  However no focal infiltrate or effusion is seen.  The heart is within upper limits normal.  Supine and erect views of the abdomen show no bowel obstruction. There is air throughout the nondistended colon.  No free intraperitoneal air is seen.  Surgical clips are present from prior cholecystectomy.  Hardware for fusion of L4-5 is again noted.  IMPRESSION:  1.  Chronic changes throughout the lungs with chronic bronchitis. No active lung disease. 2.  No bowel obstruction.  No free air.   Original Report Authenticated By: Dwyane Dee, M.D.     MEDICATIONS: Scheduled Meds:   . ALPRAZolam  1 mg Oral BID  . aspirin EC  81 mg Oral Daily  . DULoxetine  30 mg Oral Daily  . enoxaparin (LOVENOX) injection  40 mg Subcutaneous Q24H  . gabapentin  400 mg Oral TID  . insulin aspart  0-15 Units Subcutaneous TID WC  . losartan  100 mg Oral Daily  . metoCLOPramide (REGLAN) injection  10 mg Intravenous Q6H  . OxyCODONE  60 mg Oral Q12H  . pantoprazole (PROTONIX) IV  40 mg Intravenous Q12H  . polyethylene glycol  17 g Oral TID   Continuous Infusions:   . sodium chloride 125 mL/hr at 05/05/12 0831   PRN Meds:.acetaminophen, acetaminophen, alum & mag hydroxide-simeth, HYDROmorphone (DILAUDID) injection, ondansetron (ZOFRAN) IV, ondansetron, oxycodone  Antibiotics: Anti-infectives    None       Jeoffrey Massed, MD  [facility] Pager:[phone]  If 7PM-7AM, please contact night-coverage [email or web address] Password [email or web address] 05/05/2012, 3:35 PM   LOS: 1 day

## 2012-05-05 NOTE — Progress Notes (Signed)
Inpatient Diabetes Program Recommendations  AACE/ADA: New Consensus Statement on Inpatient Glycemic Control (2013)  Target Ranges:  Prepandial:   less than 140 mg/dL      Peak postprandial:   less than 180 mg/dL (1-2 hours)      Critically ill patients:  140 - 180 mg/dL   Discontinue Novolog 12 units BID.  Patient does not take Novolog 12 units BID but takes 75/25 12 units BID. Sent Dr. Jerral Ralph text page to ask for change.    Inpatient Diabetes Program Recommendations Insulin - Basal: Start 70/30 12 units BID breakfast and supper (home dose 75/25 12 units BID) Will follow. Thank you  Piedad Climes [facility] Inpatient Diabetes Coordinator [phone]

## 2012-05-05 NOTE — Progress Notes (Signed)
Utilization review completed. Maigen Mozingo, RN, BSN. 

## 2012-05-06 ENCOUNTER — Inpatient Hospital Stay (HOSPITAL_COMMUNITY): Payer: Medicare Other

## 2012-05-06 DIAGNOSIS — R197 Diarrhea, unspecified: Secondary | ICD-10-CM

## 2012-05-06 LAB — GLUCOSE, CAPILLARY
Glucose-Capillary: 154 mg/dL — ABNORMAL HIGH (ref 70–99)
Glucose-Capillary: 263 mg/dL — ABNORMAL HIGH (ref 70–99)
Glucose-Capillary: 188 mg/dL — ABNORMAL HIGH (ref 70–99)

## 2012-05-06 LAB — BASIC METABOLIC PANEL
Calcium: 8.8 mg/dL (ref 8.4–10.5)
GFR calc Af Amer: >90 mL/min (ref >90)
GFR calc non Af Amer: >90 mL/min (ref >90)
Potassium: 4.4 mEq/L (ref 3.5–5.1)
Sodium: 131 mEq/L — ABNORMAL LOW (ref 135–145)

## 2012-05-06 LAB — CBC
MCH: 30.2 pg (ref 26.0–34.0)
MCHC: 31.9 g/dL (ref 30.0–36.0)
Platelets: 208 10*3/uL (ref 150–400)

## 2012-05-06 MED ORDER — FAMOTIDINE 20 MG PO TABS
20.0000 mg | ORAL_TABLET | Freq: Every day | ORAL | Status: DC
  Filled 2012-05-06: qty 1

## 2012-05-06 NOTE — Progress Notes (Signed)
Inpatient Diabetes Program Recommendations  AACE/ADA: New Consensus Statement on Inpatient Glycemic Control (2013)  Target Ranges:  Prepandial:   less than 140 mg/dL      Peak postprandial:   less than 180 mg/dL (1-2 hours)      Critically ill patients:  140 - 180 mg/dL   Inpatient Diabetes Program Recommendations Insulin - Basal: Increase 70/30 to home dose 12 units with breakfast and supper Correction (SSI): restart Novolog correction moderate scale TID during hospitalization  Thank you  Piedad Climes BSN, RN,CDE Inpatient Diabetes Coordinator [phone] (team pager)

## 2012-05-06 NOTE — Progress Notes (Signed)
[facility] PROGRESS NOTE  Assessment/Plan: Nausea and vomiting possibly due partial SBO: - This is a recurrent issue for this patient. - He has had numerous recent admissions for similar reasons, has had numerous CT scans, and endoscopies in the past.  - Will continue with conservative medical management, x-rays of his abdomen shows? Partial small bowel obstruction. Repeated x- ray showed resolved. - He is passing flatus, multiple BM watery. Check C. Dif. - Abdomen is soft-patient is obese he has some mild tenderness on the LLQ. There is no rebound, rigidity or guarding.  - advance diet. If continue to have BM can be d/c home. - d/c PPI to minimize diarrhea.  Dehydration  - Secondary to GI losses. Hydrated with IV fluids. Supportive care.   Diabetes mellitus  - Patient unable to take his oral hypoglycemic agents secondary to problem #1.  - D/c SSI pt on 70/30 sliding scale insulin. Continue home regimen of long-acting insulin. Bg continues to improved. - hemoglobin A1c 12.3  Chronic back pain  Resume home regimen of pain medications.   Diarrhea  Check stool for C. difficile PCR.   Code Status: full Family Communication: none home in am Disposition Plan: none   Consultants:  none  Procedures:  none  Antibiotics:  none (indicate start date, and stop date if known)  HPI/Subjective: Relates feels about the same Tolerating clear  Objective: Filed Vitals:   05/05/12 1300 05/05/12 2049 05/06/12 0500 05/06/12 0601  BP: 127/81 101/57  149/83  Pulse: 71 81  78  Temp: 97.6 F (36.4 C) 98.1 F (36.7 C)  97.5 F (36.4 C)  TempSrc:  Oral  Oral  Resp: 18 18  18   Weight:   136.986 kg (302 lb)   SpO2: 100% 96%  97%    Intake/Output Summary (Last 24 hours) at 05/06/12 0838 Last data filed at 05/06/12 0300  Gross per 24 hour  Intake    350 ml  Output    550 ml  Net   -200 ml   Filed Weights   05/04/12 2309 05/06/12 0500  Weight: 137.3 kg (302 lb 11.1 oz)  136.986 kg (302 lb)    Exam:  General: Alert, awake, oriented x3, in no acute distress.  HEENT: No bruits, no goiter.  Heart: Regular rate and rhythm, without murmurs, rubs, gallops.  Lungs: Good air movement, bilateral air movement.  Abdomen: Soft, mild tenderness in LLQ. Neuro: Grossly intact, nonfocal.   Data Reviewed: Basic Metabolic Panel:  Lab 05/05/12 4782 05/04/12 1147  NA 139 140  K 4.0 4.0  CL 104 103  CO2 22 23  GLUCOSE 239* 369*  BUN 10 9  CREATININE 0.74 0.65  CALCIUM 9.0 10.0  MG 1.9 --  PHOS -- --   Liver Function Tests:  Lab 05/04/12 2031  AST 8  ALT 8  ALKPHOS 72  BILITOT 0.3  PROT 6.7  ALBUMIN 3.2*    Lab 05/04/12 2031  LIPASE 51  AMYLASE --   No results found for this basename: AMMONIA:5 in the last 168 hours CBC:  Lab 05/05/12 0630 05/04/12 1147  WBC 8.1 9.4  NEUTROABS -- 6.0  HGB 15.0 16.8  HCT 46.4 48.2  MCV 95.7 93.4  PLT 243 265   Cardiac Enzymes: No results found for this basename: CKTOTAL:5,CKMB:5,CKMBINDEX:5,TROPONINI:5 in the last 168 hours BNP (last 3 results)  Basename 04/27/12 0951  PROBNP 78.2   CBG:  Lab 05/06/12 0645 05/05/12 2124 05/05/12 1631 05/05/12 1135 05/05/12 0649  GLUCAP 154*  226* 156* 201* 233*    No results found for this or any previous visit (from the past 240 hour(s)).   Studies: Dg Chest 2 View  05/04/2012  *RADIOLOGY REPORT*  Clinical Data: Hemoptysis.  CHEST - 2 VIEW  Comparison: April 27, 2012.  Findings: Cardiomediastinal silhouette appears normal. Interstitial densities are noted in both lung bases which are unchanged and consistent with scarring.  No acute pulmonary disease is noted.  IMPRESSION: No acute cardiopulmonary abnormality seen.   Original Report Authenticated By: Lupita Raider.,  M.D.    Abd 1 View (kub)  05/05/2012  *RADIOLOGY REPORT*  Clinical Data: Nausea and abdominal pain.  ABDOMEN - 1 VIEW  Comparison: Chest and two views abdomen 04/22/2012.  CT abdomen and pelvis  03/28/2012.  Findings: There is gaseous distention of small bowel loops measuring up to 4.9 cm.  There is some gas and stool in the colon. Cholecystectomy clips and spinal stabilization hardware noted.  IMPRESSION: Abnormal bowel gas pattern compatible with partial or early complete small bowel obstruction.   Original Report Authenticated By: Holley Dexter, M.D.    Dg Abd 2 Views  05/06/2012  *RADIOLOGY REPORT*  Clinical Data: A small bowel obstruction with left abdominal pain.  ABDOMEN - 2 VIEW  Comparison: 05/04/2012  Findings: Upright film shows no evidence for intraperitoneal free air. Supine abdomen shows interval resolution of the gaseous small bowel distention seen centrally on the previous study.  There is now increased gas volume within the transverse colon.  IMPRESSION: Interval resolution of gaseous small bowel dilatation with increased gas now visible in the mid colon.   Original Report Authenticated By: Kennith Center, M.D.     Scheduled Meds:   . ALPRAZolam  1 mg Oral BID  . aspirin EC  81 mg Oral Daily  . DULoxetine  30 mg Oral Daily  . enoxaparin (LOVENOX) injection  40 mg Subcutaneous Q24H  . gabapentin  400 mg Oral TID  . insulin aspart  0-15 Units Subcutaneous TID WC  . insulin aspart protamine-insulin aspart  10 Units Subcutaneous BID WC  . losartan  100 mg Oral Daily  . metoCLOPramide (REGLAN) injection  10 mg Intravenous Q6H  . OxyCODONE  60 mg Oral Q12H  . pantoprazole (PROTONIX) IV  40 mg Intravenous Q12H  . polyethylene glycol  17 g Oral TID   Continuous Infusions:   . sodium chloride 50 mL/hr at 05/05/12 1130     FELIZ Rosine Beat  [facility] Pager [phone]. If 8PM-8AM, please contact night-coverage at [email or web address], password [facility] 05/06/2012, 8:38 AM  LOS: 2 days

## 2012-05-06 NOTE — Evaluation (Addendum)
Occupational Therapy Evaluation Patient Details Name: Ryan [name] MRN: [id number] DOB: [birth year] Today's Date: 05/06/2012 Time: 1478-2956 OT Time Calculation (min): 13 min  OT Assessment / Plan / Recommendation Clinical Impression  This 62 year old man was admitted for nausea/vomiting.  He has had several admissions and many ED visits over the past 6 months.  Pt was educated on AE for adls where he reports having problems. Pt verbalizes understanding of these.  No further OT is needed at this time.      OT Assessment  Patient does not need any further OT services    Follow Up Recommendations  No OT follow up    Barriers to Discharge      Equipment Recommendations  None recommended by OT    Recommendations for Other Services    Frequency       Precautions / Restrictions Precautions Precautions: Fall   Pertinent Vitals/Pain Abdominal pain present: pt requested pain meds.     ADL  Lower Body Bathing: Simulated;Minimal assistance Where Assessed - Lower Body Bathing: Supported sit to stand Lower Body Dressing: Simulated;Moderate assistance (cannot don socks) Where Assessed - Lower Body Dressing: Supported sit to stand Toileting - Architect and Hygiene: Simulated;Moderate assistance Where Assessed - Toileting Clothing Manipulation and Hygiene: Sit to stand from 3-in-1 or toilet (has difficulty reaching) Equipment Used: Sock aid (toilet aid) ADL Comments: pt seen at eob--was supervision with PT.  Pt states that his housemate cannot help with anything.  His main concerns are getting socks on, toileting and meal prep.  Showed sock aide (and pt return demonstrated).  Also showed 2 versions of toilet aids.  Resources given.  Also educated that pt may use clothespin on end of his long brush for hygiene going front to back if he cannot get a toilet aid.  Also discussed hand held shower.  Pt sits on his bench at home.   Pt states he had a long reacher which was stolen.  He  hopes to get another.  This would make ADLs easier.     OT Diagnosis:    OT Problem List:   OT Treatment Interventions:     OT Goals    Visit Information  Last OT Received On: 05/06/12 Assistance Needed: +1    Subjective Data  Subjective: I have difficulty with hygiene Patient Stated Goal: be able to complete hygiene   Prior Functioning     Home Living Bathroom Shower/Tub: Tub/shower unit Bathroom Toilet: Handicapped height Home Adaptive Equipment: Walker - rolling Communication Communication: No difficulties         Vision/Perception     Cognition  Overall Cognitive Status: Appears within functional limits for tasks assessed/performed Arousal/Alertness: Awake/alert Orientation Level: Appears intact for tasks assessed Behavior During Session: [facility] for tasks performed    Extremity/Trunk Assessment Right Upper Extremity Assessment RUE ROM/Strength/Tone: Within functional levels Left Upper Extremity Assessment LUE ROM/Strength/Tone: Within functional levels     Mobility       Shoulder Instructions     Exercise     Balance     End of Session    GO     Kenasia Scheller 05/06/2012, 2:17 PM Marica Otter, OTR/L [phone] 05/06/2012

## 2012-05-06 NOTE — Progress Notes (Signed)
Pt has not had BM so far this shift.  Stated he has "thrown-up and flushed it down the toilet".  Staff has not witnessed N/V/D.  Will continue to monitor.

## 2012-05-07 LAB — GLUCOSE, CAPILLARY: Glucose-Capillary: 197 mg/dL — ABNORMAL HIGH (ref 70–99)

## 2012-05-07 MED ORDER — OXYCODONE HCL ER 60 MG PO T12A: 1.0000 | EXTENDED_RELEASE_TABLET | Freq: Two times a day (BID) | ORAL | Status: DC

## 2012-05-07 MED ORDER — OXYCODONE HCL 30 MG PO TABS: 30.0000 mg | ORAL_TABLET | ORAL | Status: DC | PRN

## 2012-05-07 NOTE — Discharge Summary (Signed)
Physician Discharge Summary  BIENVENIDO PROEHL HQI:[id number] DOB: 04/05/1951 DOA: 05/04/2012  PCP: Warrick Parisian, MD  Admit date: 05/04/2012 Discharge date: 05/07/2012  Time spent: 30 minutes  Recommendations for Outpatient Follow-up:  1. Follow up with PCP  Discharge Diagnoses:  Principal Problem:  *Nausea and vomiting Active Problems:  DIABETES MELLITUS, TYPE II  HYPERTENSION  GERD  Abdominal pain  Narcotic dependence  Dehydration  Diarrhea   Discharge Condition: stable  Diet recommendation: low carb modified  Filed Weights   05/04/12 2309 05/06/12 0500 05/07/12 0500  Weight: 137.3 kg (302 lb 11.1 oz) 136.986 kg (302 lb) 137.44 kg (303 lb)    History of present illness:  62 y.o. male with history of hypertension, insulin requiring diabetes, morbid obesity, coronary artery disease status post PCI, chronic abdominal pain with chronic pain syndrome requiring chronic narcotics, history of pancreatitis, status post cholecystectomy, anxiety who presents to the ED with a 6-7 day history of nausea, emesis, worsening abdominal pain. Patient does endorse a fever of 102 with some chills a brownish cough. Patient also endorses some generalized weakness. Patient states he'll PCP 2 weeks ago was placed on Reglan with some improvement in his symptoms however symptoms came back. Patient also endorses some diarrhea with dark. Patient states diarrhea is watery and has about 4/5 per day over the past 2-3 days. Patient states was treated with antibiotics for bronchitis 2-3 weeks prior to admission. Patient denies any alcohol use. Patient presented to the ED on Christmas Day per patient with similar symptoms.  Patient was seen in the ED chest x-ray which was done was unremarkable. Blood glucose level was 350. Basic metabolic profile done was unremarkable. CBC done was unremarkable. Urinalysis which was done did show a specific gravity greater than 1.046, greater than 1000 glucose, 15 ketones, 30  proteins. Will call to admit the patient for further evaluation and management. Patient states unable to keep anything down   Hospital Course:  Nausea and vomiting possibly due partial SBO: - This is a recurrent issue for this patient.  - He has had numerous recent admissions for similar reasons, has had numerous CT scans, and endoscopies in the past.  - Will continue with conservative medical management, x-rays of his abdomen shows? Partial small bowel obstruction. Repeated x- ray showed resolved.  - He is passing flatus, multiple BM watery. Had no diarhea. - Abdomen is soft-patient is obese he has some mild tenderness on the LLQ. There is no rebound, rigidity or guarding.  - advance diet. If continue to have BM can be d/c home.   Dehydration  - Secondary to GI losses. Hydrated with IV fluids. Supportive care.   Diabetes mellitus  - Patient unable to take his oral hypoglycemic agents secondary to problem #1. Started on SSI once he was able to take orals. - D/c SSI pt on 70/30 sliding scale insulin. Continue home regimen of long-acting insulin. Bg continues to improved.  - hemoglobin A1c 12.3  - further management per PCP.  Chronic back pain  Resume home regimen of pain medications.   Consultants:  none Procedures:  none Antibiotics:  none (indicate start date, and stop date if known)   Discharge Exam: Filed Vitals:   05/06/12 0601 05/06/12 1532 05/06/12 2100 05/07/12 0500  BP: 149/83 122/67 117/69   Pulse: 78 85 82   Temp: 97.5 F (36.4 C) 98.5 F (36.9 C) 97.7 F (36.5 C)   TempSrc: Oral  Oral   Resp: 18 18 18    Weight:  137.44 kg (303 lb)  SpO2: 97% 95% 97%     General: A & O x3  Cardiovascular: RRR Respiratory: good air movement CTA B/L  Discharge Instructions  Discharge Orders    Future Orders Please Complete By Expires   Diet - low sodium heart healthy      Increase activity slowly          Medication List     As of 05/07/2012  7:41 AM    STOP  taking these medications         oxyCODONE-acetaminophen 5-325 MG per tablet   Commonly known as: PERCOCET/ROXICET      TAKE these medications         ALPRAZolam 1 MG tablet   Commonly known as: XANAX   Take 1 tablet (1 mg total) by mouth 2 (two) times daily.      aspirin EC 81 MG tablet   Take 81 mg by mouth daily.      DULoxetine 30 MG capsule   Commonly known as: CYMBALTA   Take 1 capsule (30 mg total) by mouth daily.      gabapentin 400 MG capsule   Commonly known as: NEURONTIN   Take 1 capsule (400 mg total) by mouth 3 (three) times daily.      glipiZIDE 10 MG 24 hr tablet   Commonly known as: GLUCOTROL XL   Take 10 mg by mouth 2 (two) times daily.      insulin lispro protamine-insulin lispro (75-25) 100 UNIT/ML Susp   Commonly known as: HUMALOG 75/25   Inject 12 Units into the skin 2 (two) times daily with a meal.      lactulose 10 GM/15ML solution   Commonly known as: CHRONULAC   Take 20 g by mouth 2 (two) times daily.      losartan 100 MG tablet   Commonly known as: COZAAR   Take 100 mg by mouth daily.      metFORMIN 850 MG tablet   Commonly known as: GLUCOPHAGE   Take 850 mg by mouth 3 (three) times daily.      metoCLOPramide 10 MG tablet   Commonly known as: REGLAN   Take 10 mg by mouth every 6 (six) hours as needed. For nausea      ondansetron 4 MG tablet   Commonly known as: ZOFRAN   Take 4 mg by mouth every 6 (six) hours as needed. For nausea      oxycodone 30 MG immediate release tablet   Commonly known as: ROXICODONE   Take 1 tablet (30 mg total) by mouth every 4 (four) hours as needed. For breakthrough pain      oxycodone 30 MG immediate release tablet   Commonly known as: ROXICODONE   Take 1 tablet (30 mg total) by mouth every 4 (four) hours as needed (for breakthrough pain).      OxyCODONE HCl ER 60 MG T12a   Take 1 tablet by mouth every 12 (twelve) hours.      pantoprazole 40 MG tablet   Commonly known as: PROTONIX   Take 1 tablet (40  mg total) by mouth daily.      sennosides-docusate sodium 8.6-50 MG tablet   Commonly known as: SENOKOT-S   Take 2 tablets by mouth daily. For constipation      ZANTAC PO   Take 300 mg by mouth 2 (two) times daily.           Follow-up Information    Follow up  with Warrick Parisian, MD. In 2 weeks. (hospital follow up)    Contact information:   [address] [place] [address] [phone]           The results of significant diagnostics from this hospitalization (including imaging, microbiology, ancillary and laboratory) are listed below for reference.    Significant Diagnostic Studies: Dg Chest 2 View  05/04/2012  *RADIOLOGY REPORT*  Clinical Data: Hemoptysis.  CHEST - 2 VIEW  Comparison: April 27, 2012.  Findings: Cardiomediastinal silhouette appears normal. Interstitial densities are noted in both lung bases which are unchanged and consistent with scarring.  No acute pulmonary disease is noted.  IMPRESSION: No acute cardiopulmonary abnormality seen.   Original Report Authenticated By: Lupita Raider.,  M.D.    Dg Chest 2 View  04/27/2012  *RADIOLOGY REPORT*  Clinical Data: Shortness of breath, cough  CHEST - 2 VIEW  Comparison: 04/22/2012  Findings: Normal heart size and stable prominent central vascularity.  Slight diffuse interstitial changes, unchanged.  No definite superimposed edema or pneumonia.  Remote left rib fractures evident.  Coronary stents identified.  Trachea is midline.  No collapse, consolidation, effusion or pneumothorax.  IMPRESSION: Stable chronic changes.  No superimposed acute process   Original Report Authenticated By: Judie Petit. Miles Costain, M.D.    Abd 1 View (kub)  05/05/2012  *RADIOLOGY REPORT*  Clinical Data: Nausea and abdominal pain.  ABDOMEN - 1 VIEW  Comparison: Chest and two views abdomen 04/22/2012.  CT abdomen and pelvis 03/28/2012.  Findings: There is gaseous distention of small bowel loops measuring up to 4.9 cm.  There is some gas and stool in  the colon. Cholecystectomy clips and spinal stabilization hardware noted.  IMPRESSION: Abnormal bowel gas pattern compatible with partial or early complete small bowel obstruction.   Original Report Authenticated By: Holley Dexter, M.D.    Ct Angio Chest Pe W/cm &/or Wo Cm  04/27/2012  *RADIOLOGY REPORT*  Clinical Data: Short of breath  CT ANGIOGRAPHY CHEST  Technique:  Multidetector CT imaging of the chest using the standard protocol during bolus administration of intravenous contrast. Multiplanar reconstructed images including MIPs were obtained and reviewed to evaluate the vascular anatomy.  Contrast: OMNIPAQUE IOHEXOL 350 MG/ML SOLN  Comparison: 11/13/2010  Findings: No filling defects in the pulmonary arterial tree to suggest acute pulmonary thromboembolism.  Ill-defined left thyroid lesion is not significantly changed.  Stent in the left anterior descending coronary artery. Aberrant right subclavian artery, a normal variant.  Small scattered mediastinal nodes are improved.  Pericardial effusion.  No pleural effusion.  No pneumothorax.  Patchy bilateral ground-glass opacities without a particular predilection are noted.  Areas of interstitial prominence are also noted.  Chronic left-sided rib deformities.  No definite acute rib fracture.  IMPRESSION: No evidence of acute pulmonary thromboembolism.  Stable ill-defined left thyroid hypodensity.  Patchy ground-glass opacities as described worrisome for inflammatory process.   Original Report Authenticated By: Jolaine Click, M.D.    Dg Abd 2 Views  05/06/2012  *RADIOLOGY REPORT*  Clinical Data: A small bowel obstruction with left abdominal pain.  ABDOMEN - 2 VIEW  Comparison: 05/04/2012  Findings: Upright film shows no evidence for intraperitoneal free air. Supine abdomen shows interval resolution of the gaseous small bowel distention seen centrally on the previous study.  There is now increased gas volume within the transverse colon.  IMPRESSION:  Interval resolution of gaseous small bowel dilatation with increased gas now visible in the mid colon.   Original Report Authenticated By: Minerva Areola  Molli Posey, M.D.    Dg Abd Acute W/chest  04/22/2012  *RADIOLOGY REPORT*  Clinical Data: Shortness of breath, cough, mid abdominal pain, nausea, vomiting, and diarrhea  ACUTE ABDOMEN SERIES (ABDOMEN 2 VIEW & CHEST 1 VIEW)  Comparison: Chest and acute abdomen of 03/04/2012 and CT abdomen pelvis of 03/28/2012  Findings: Prominent lung markings remain which appear chronic, as well as changes of chronic bronchitis.  However no focal infiltrate or effusion is seen.  The heart is within upper limits normal.  Supine and erect views of the abdomen show no bowel obstruction. There is air throughout the nondistended colon.  No free intraperitoneal air is seen.  Surgical clips are present from prior cholecystectomy.  Hardware for fusion of L4-5 is again noted.  IMPRESSION:  1.  Chronic changes throughout the lungs with chronic bronchitis. No active lung disease. 2.  No bowel obstruction.  No free air.   Original Report Authenticated By: Dwyane Dee, M.D.     Microbiology: No results found for this or any previous visit (from the past 240 hour(s)).   Labs: Basic Metabolic Panel:  Lab 05/06/12 1610 05/05/12 0630 05/04/12 1147  NA 131* 139 140  K 4.4 4.0 4.0  CL 97 104 103  CO2 24 22 23   GLUCOSE 318* 239* 369*  BUN 10 10 9   CREATININE 0.86 0.74 0.65  CALCIUM 8.8 9.0 10.0  MG -- 1.9 --  PHOS -- -- --   Liver Function Tests:  Lab 05/04/12 2031  AST 8  ALT 8  ALKPHOS 72  BILITOT 0.3  PROT 6.7  ALBUMIN 3.2*    Lab 05/04/12 2031  LIPASE 51  AMYLASE --   No results found for this basename: AMMONIA:5 in the last 168 hours CBC:  Lab 05/06/12 1534 05/05/12 0630 05/04/12 1147  WBC 5.4 8.1 9.4  NEUTROABS -- -- 6.0  HGB 12.6* 15.0 16.8  HCT 39.5 46.4 48.2  MCV 94.7 95.7 93.4  PLT 208 243 265   Cardiac Enzymes: No results found for this basename:  CKTOTAL:5,CKMB:5,CKMBINDEX:5,TROPONINI:5 in the last 168 hours BNP: BNP (last 3 results)  Basename 04/27/12 0951  PROBNP 78.2   CBG:  Lab 05/07/12 0706 05/06/12 2206 05/06/12 1639 05/06/12 1153 05/06/12 0645  GLUCAP 197* 188* 263* 293* 154*       Signed:  FELIZ ORTIZ, Eman Rynders  [facility] 05/07/2012, 7:41 AM

## 2012-05-25 ENCOUNTER — Emergency Department (HOSPITAL_COMMUNITY)
Admission: EM | Admit: 2012-05-25 | Discharge: 2012-05-25 | Disposition: A | Discharge status: Home / Self Care | Payer: Medicare Other | Attending: Emergency Medicine | Admitting: Emergency Medicine

## 2012-05-25 ENCOUNTER — Emergency Department (HOSPITAL_COMMUNITY): Payer: Medicare Other

## 2012-05-25 ENCOUNTER — Encounter (HOSPITAL_COMMUNITY): Payer: Self-pay | Admitting: Emergency Medicine

## 2012-05-25 DIAGNOSIS — Z8659 Personal history of other mental and behavioral disorders: Secondary | ICD-10-CM | POA: Insufficient documentation

## 2012-05-25 DIAGNOSIS — Z86718 Personal history of other venous thrombosis and embolism: Secondary | ICD-10-CM | POA: Insufficient documentation

## 2012-05-25 DIAGNOSIS — E785 Hyperlipidemia, unspecified: Secondary | ICD-10-CM | POA: Insufficient documentation

## 2012-05-25 DIAGNOSIS — G8929 Other chronic pain: Secondary | ICD-10-CM

## 2012-05-25 DIAGNOSIS — R109 Unspecified abdominal pain: Secondary | ICD-10-CM

## 2012-05-25 DIAGNOSIS — J3489 Other specified disorders of nose and nasal sinuses: Secondary | ICD-10-CM | POA: Insufficient documentation

## 2012-05-25 DIAGNOSIS — I10 Essential (primary) hypertension: Secondary | ICD-10-CM | POA: Insufficient documentation

## 2012-05-25 DIAGNOSIS — I2510 Atherosclerotic heart disease of native coronary artery without angina pectoris: Secondary | ICD-10-CM | POA: Insufficient documentation

## 2012-05-25 DIAGNOSIS — K219 Gastro-esophageal reflux disease without esophagitis: Secondary | ICD-10-CM | POA: Insufficient documentation

## 2012-05-25 DIAGNOSIS — Z794 Long term (current) use of insulin: Secondary | ICD-10-CM | POA: Insufficient documentation

## 2012-05-25 DIAGNOSIS — Y939 Activity, unspecified: Secondary | ICD-10-CM | POA: Insufficient documentation

## 2012-05-25 DIAGNOSIS — Z79899 Other long term (current) drug therapy: Secondary | ICD-10-CM | POA: Insufficient documentation

## 2012-05-25 DIAGNOSIS — F17200 Nicotine dependence, unspecified, uncomplicated: Secondary | ICD-10-CM | POA: Insufficient documentation

## 2012-05-25 DIAGNOSIS — R05 Cough: Secondary | ICD-10-CM | POA: Insufficient documentation

## 2012-05-25 DIAGNOSIS — Z8701 Personal history of pneumonia (recurrent): Secondary | ICD-10-CM | POA: Insufficient documentation

## 2012-05-25 DIAGNOSIS — I252 Old myocardial infarction: Secondary | ICD-10-CM | POA: Insufficient documentation

## 2012-05-25 DIAGNOSIS — R112 Nausea with vomiting, unspecified: Secondary | ICD-10-CM

## 2012-05-25 DIAGNOSIS — S3981XA Other specified injuries of abdomen, initial encounter: Secondary | ICD-10-CM | POA: Insufficient documentation

## 2012-05-25 DIAGNOSIS — R296 Repeated falls: Secondary | ICD-10-CM | POA: Insufficient documentation

## 2012-05-25 DIAGNOSIS — R059 Cough, unspecified: Secondary | ICD-10-CM | POA: Insufficient documentation

## 2012-05-25 DIAGNOSIS — Y929 Unspecified place or not applicable: Secondary | ICD-10-CM | POA: Insufficient documentation

## 2012-05-25 DIAGNOSIS — I509 Heart failure, unspecified: Secondary | ICD-10-CM | POA: Insufficient documentation

## 2012-05-25 DIAGNOSIS — Z7982 Long term (current) use of aspirin: Secondary | ICD-10-CM | POA: Insufficient documentation

## 2012-05-25 LAB — POCT I-STAT 3, VENOUS BLOOD GAS (G3P V)
TCO2: 28 mmol/L (ref 0–100)
pCO2, Ven: 45 mmHg (ref 45.0–50.0)
pH, Ven: 7.387 — ABNORMAL HIGH (ref 7.250–7.300)

## 2012-05-25 LAB — GLUCOSE, CAPILLARY

## 2012-05-25 LAB — COMPREHENSIVE METABOLIC PANEL
AST: 13 U/L (ref 0–37)
Albumin: 3.4 g/dL — ABNORMAL LOW (ref 3.5–5.2)
Alkaline Phosphatase: 77 U/L (ref 39–117)
Chloride: 103 mEq/L (ref 96–112)
Creatinine, Ser: 0.57 mg/dL (ref 0.50–1.35)
Potassium: 4 mEq/L (ref 3.5–5.1)
Total Bilirubin: 0.2 mg/dL — ABNORMAL LOW (ref 0.3–1.2)

## 2012-05-25 LAB — LACTIC ACID, PLASMA: Lactic Acid, Venous: 1.4 mmol/L (ref 0.5–2.2)

## 2012-05-25 LAB — CBC WITH DIFFERENTIAL/PLATELET
Basophils Absolute: 0 10*3/uL (ref 0.0–0.1)
Basophils Relative: 0 % (ref 0–1)
MCHC: 32.2 g/dL (ref 30.0–36.0)
Neutro Abs: 3.4 10*3/uL (ref 1.7–7.7)
Neutrophils Relative %: 64 % (ref 43–77)
RDW: 13.3 % (ref 11.5–15.5)

## 2012-05-25 LAB — URINALYSIS, ROUTINE W REFLEX MICROSCOPIC
Glucose, UA: >1000 mg/dL — AB
Ketones, ur: 15 mg/dL — AB
Leukocytes, UA: NEGATIVE
Specific Gravity, Urine: 1.038 — ABNORMAL HIGH (ref 1.005–1.030)
pH: 7 (ref 5.0–8.0)

## 2012-05-25 LAB — TROPONIN I: Troponin I: <0.3 ng/mL (ref <0.30)

## 2012-05-25 LAB — URINE MICROSCOPIC-ADD ON

## 2012-05-25 MED ORDER — GABAPENTIN 400 MG PO CAPS: 400.0000 mg | ORAL_CAPSULE | Freq: Three times a day (TID) | ORAL | Status: DC

## 2012-05-25 MED ORDER — HYDROMORPHONE HCL PF 1 MG/ML IJ SOLN
1.0000 mg | Freq: Once | INTRAMUSCULAR | Status: AC
  Administered 2012-05-25: 1 mg via INTRAVENOUS
  Filled 2012-05-25: qty 1

## 2012-05-25 MED ORDER — PROMETHAZINE HCL 25 MG RE SUPP: 25.0000 mg | Freq: Four times a day (QID) | RECTAL | Status: DC | PRN

## 2012-05-25 MED ORDER — SODIUM CHLORIDE 0.9 % IV BOLUS (SEPSIS)
1000.0000 mL | Freq: Once | INTRAVENOUS | Status: AC
  Administered 2012-05-25: 1000 mL via INTRAVENOUS

## 2012-05-25 MED ORDER — OXYCODONE HCL 30 MG PO TABS: 30.0000 mg | ORAL_TABLET | ORAL | Status: DC | PRN

## 2012-05-25 MED ORDER — FENTANYL CITRATE 0.05 MG/ML IJ SOLN
100.0000 ug | Freq: Once | INTRAMUSCULAR | Status: AC
  Administered 2012-05-25: 100 ug via INTRAVENOUS
  Filled 2012-05-25 (×2): qty 2

## 2012-05-25 MED ORDER — ALPRAZOLAM 1 MG PO TABS: 1.0000 mg | ORAL_TABLET | Freq: Two times a day (BID) | ORAL | Status: DC

## 2012-05-25 NOTE — ED Notes (Signed)
Pt returned from radiology.

## 2012-05-25 NOTE — ED Provider Notes (Signed)
Medical screening examination/treatment/procedure(s) were conducted as a shared visit with non-physician practitioner(s) and myself.  I personally evaluated the patient during the encounter  Ryan [name] is a 62 y.o. male hx of HTN, CAD, recent SBO here with slurred speech and vomiting. Ab xray showed no SBO. Felt better with pain meds and nausea meds and IVF. CT head nl. He woke up after pain meds. I think he is likely altered from pain and dehydration. D/c home stable condition.    Richardean Canal, MD 05/25/12 1254

## 2012-05-25 NOTE — ED Notes (Addendum)
Given PO fluids for fluid challenge

## 2012-05-25 NOTE — ED Notes (Signed)
Pt states that Fentanyl doesn't work for him. When asked what normally works for his pain, he states Dilaudid and that it usually takes 3 mg.

## 2012-05-25 NOTE — ED Notes (Signed)
PT. REPORTS GENERALIZED WEAKNESS , FELL THIS MORNING AT HOME WITH NASAL CONGESTION , CHEST CONGESTION AND VOMITTING.

## 2012-05-25 NOTE — ED Provider Notes (Signed)
History     CSN: [id number]  Arrival date & time 05/25/12  4098   First MD Initiated Contact with Patient 05/25/12 0700      Chief Complaint  Patient presents with  . Fall    (Consider location/radiation/quality/duration/timing/severity/associated sxs/prior treatment) Patient is a 62 y.o. male presenting with fall. The history is provided by the patient.  Fall The accident occurred 6 to 12 hours ago. The point of impact was the left shoulder. He was ambulatory at the scene. There was no entrapment after the fall. Associated symptoms include abdominal pain, nausea and vomiting. Pertinent negatives include no fever. Associated symptoms comments: He reports fall due to weakness after several days of N, V, congestion. No diarrhea. He complains of abdominal pain in the lower quadrants. No known fever. He denies urinary symptoms. He has not been able to hold down any of his usual medications. He continues to take his insulin as prescribed although not eating. .    Past Medical History  Diagnosis Date  . Hypertension   . DVT (deep venous thrombosis) ~ 2011    BLE  . 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 Hyperlipidemia   . CAD (coronary atherosclerotic disease)     s/p stents x2  . Small bowel obstruction   . Pancreatitis   . Anxiety   . GERD (gastroesophageal reflux disease)   . CHF (congestive heart failure)   . Myocardial infarction ~ 2009  . Anginal pain   . Pneumonia ~ 2012  . Shortness of breath     "lying down & w/exertion" (03/04/2012)  . Type II diabetes mellitus   . Hep B w/o coma 1970's    "I was in [place]" (03/04/2012)  . Chronic lower back pain   . PTSD (post-traumatic stress disorder)     Past Surgical History  Procedure Date  . Esophagogastroduodenoscopy 09/22/2011    Procedure: ESOPHAGOGASTRODUODENOSCOPY (EGD);  Surgeon: Charna Elizabeth, MD;  Location: [facility] ENDOSCOPY;  Service: Endoscopy;  Laterality: N/A;  . Esophagogastroduodenoscopy  01/05/2012    Procedure: ESOPHAGOGASTRODUODENOSCOPY (EGD);  Surgeon: Rachael Fee, MD;  Location: [facility] ENDOSCOPY;  Service: Endoscopy;  Laterality: N/A;  . Cholecystectomy ~ 2009  . Spinal growth rods ~ 2011  . Tonsillectomy and adenoidectomy   . Coronary angioplasty with stent placement ~ 2009`    "1 + ! (after 1st one failed)" (03/04/2012)    Family History  Problem Relation Age of Onset  . Diabetes Father   . Colon cancer Neg Hx     History  Substance Use Topics  . Smoking status: Current Every Day Smoker -- 0.5 packs/day for 38 years    Types: Cigarettes  . Smokeless tobacco: Never Used  . 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  HENT: Positive for congestion. Negative for trouble swallowing.   Respiratory: Positive for cough. Negative for shortness of breath.   Cardiovascular: Negative for chest pain.  Gastrointestinal: Positive for nausea, vomiting and abdominal pain.  Genitourinary: Negative for dysuria and flank pain.  Musculoskeletal: Positive for myalgias.  Skin: Negative for rash.  Neurological: Positive for weakness. Negative for syncope.  Psychiatric/Behavioral: Negative for confusion.    Allergies  Lisinopril and Morphine and related  Home Medications   Current Outpatient Rx  Name  Route  Sig  Dispense  Refill  . ALPRAZOLAM 1 MG PO TABS   Oral   Take 1 tablet (1 mg total) by mouth 2 (two) times daily.   4 tablet   0   . ASPIRIN EC 81 MG PO TBEC   Oral   Take 81 mg by mouth daily.         . DULOXETINE HCL 30 MG PO CPEP   Oral   Take 1 capsule (30 mg total) by mouth daily.         Marland Kitchen GABAPENTIN 400 MG PO CAPS   Oral   Take 400 mg by mouth 3 (three) times daily.         Marland Kitchen GLIPIZIDE ER 10 MG PO TB24   Oral   Take 10 mg by mouth 2 (two) times daily.         . INSULIN LISPRO  PROT & LISPRO (75-25) 100 UNIT/ML [place] SUSP   Subcutaneous   Inject 12 Units into the skin 2 (two) times daily with a meal.         . LACTULOSE 10 GM/15ML PO SOLN   Oral   Take 20 g by mouth 2 (two) times daily.         Marland Kitchen LOSARTAN POTASSIUM 100 MG PO TABS   Oral   Take 100 mg by mouth daily.         Marland Kitchen METFORMIN HCL 850 MG PO TABS   Oral   Take 850 mg by mouth 3 (three) times daily.         Marland Kitchen METOCLOPRAMIDE HCL 10 MG PO TABS   Oral   Take 10 mg by mouth every 6 (six) hours as needed. For nausea         . ONDANSETRON HCL 4 MG PO TABS   Oral   Take 4 mg by mouth every 6 (six) hours as needed. For nausea         . OXYCODONE HCL 30 MG PO TABS   Oral   Take 30 mg by mouth every 4 (four) hours as needed. For breakthrough pain         . OXYCODONE HCL ER 60 MG PO T12A   Oral   Take 1 tablet by mouth every 12 (twelve) hours.   8 each   0   . PANTOPRAZOLE SODIUM 40 MG PO TBEC   Oral   Take 40 mg by mouth daily.         Marland Kitchen ZANTAC PO   Oral   Take 300 mg by mouth 2 (two) times daily.          . SENNA-DOCUSATE SODIUM 8.6-50 MG PO TABS   Oral   Take 2 tablets by mouth daily. For constipation   60 tablet   0     BP 157/70  Pulse 72  Temp 98.8 F (37.1 C) (Oral)  Resp 28  SpO2 93%  Physical Exam  Constitutional: He is oriented to person, place, and time. He appears well-developed and well-nourished. No distress.  HENT:  Head: Normocephalic and atraumatic.  Mouth/Throat: Mucous membranes are dry.  Neck: Normal range of motion. Neck supple.  Cardiovascular: Normal rate and regular rhythm.   Pulmonary/Chest: Effort normal and breath sounds normal. He exhibits no tenderness.  Abdominal: Soft. Bowel sounds are normal. There is tenderness. There is no rebound and no guarding.       Tender lower quadrants to soft abdomen. Tenderness is mild.  Musculoskeletal: Normal range of motion. He exhibits no edema.  Neurological: He is oriented to person, place, and  time.  Skin: Skin is warm and dry. No rash noted.  Psychiatric: He has a normal mood and affect.    ED Course  Procedures (including critical care time)  Labs Reviewed  GLUCOSE, CAPILLARY - Abnormal; Notable for the following:    Glucose-Capillary 316 (*)     All other components within normal limits  CBC WITH DIFFERENTIAL  COMPREHENSIVE METABOLIC PANEL  LIPASE, BLOOD  URINALYSIS, ROUTINE W REFLEX MICROSCOPIC  TROPONIN I  LACTIC ACID, PLASMA  BLOOD GAS, VENOUS   Results for orders placed during the hospital encounter of 05/25/12  GLUCOSE, CAPILLARY      Component Value Range   Glucose-Capillary 316 (*) 70 - 99 mg/dL   Comment 1 Documented in Chart     Comment 2 Notify RN    CBC WITH DIFFERENTIAL      Component Value Range   WBC 5.3  4.0 - 10.5 K/uL   RBC 4.69  4.22 - 5.81 MIL/uL   Hemoglobin 14.4  13.0 - 17.0 g/dL   HCT 09.8  11.9 - 14.7 %   MCV 95.3  78.0 - 100.0 fL   MCH 30.7  26.0 - 34.0 pg   MCHC 32.2  30.0 - 36.0 g/dL   RDW 82.9  56.2 - 13.0 %   Platelets 272  150 - 400 K/uL   Neutrophils Relative 64  43 - 77 %   Neutro Abs 3.4  1.7 - 7.7 K/uL   Lymphocytes Relative 24  12 - 46 %   Lymphs Abs 1.3  0.7 - 4.0 K/uL   Monocytes Relative 11  3 - 12 %   Monocytes Absolute 0.6  0.1 - 1.0 K/uL   Eosinophils Relative 1  0 - 5 %   Eosinophils Absolute 0.0  0.0 - 0.7 K/uL   Basophils Relative 0  0 - 1 %   Basophils Absolute 0.0  0.0 - 0.1 K/uL  COMPREHENSIVE METABOLIC PANEL      Component Value Range   Sodium 138  135 - 145 mEq/L   Potassium 4.0  3.5 - 5.1 mEq/L   Chloride 103  96 - 112 mEq/L   CO2 23  19 - 32 mEq/L   Glucose, Bld 337 (*) 70 - 99 mg/dL   BUN 8  6 - 23 mg/dL   Creatinine, Ser 8.65  0.50 - 1.35 mg/dL   Calcium 9.6  8.4 - 78.4 mg/dL   Total Protein 7.2  6.0 - 8.3 g/dL   Albumin 3.4 (*) 3.5 - 5.2 g/dL   AST 13  0 - 37 U/L   ALT 7  0 - 53 U/L   Alkaline Phosphatase 77  39 - 117 U/L   Total Bilirubin 0.2 (*) 0.3 - 1.2 mg/dL   GFR calc non Af Amer  >90  >90 mL/min   GFR calc Af Amer >90  >90 mL/min  LIPASE, BLOOD      Component Value Range   Lipase 15  11 - 59 U/L  URINALYSIS, ROUTINE W REFLEX MICROSCOPIC      Component Value Range   Color, Urine YELLOW  YELLOW   APPearance CLEAR  CLEAR   Specific Gravity, Urine 1.038 (*) 1.005 - 1.030   pH 7.0  5.0 - 8.0   Glucose, UA >1000 (*) NEGATIVE mg/dL   Hgb urine dipstick NEGATIVE  NEGATIVE   Bilirubin Urine NEGATIVE  NEGATIVE   Ketones, ur 15 (*) NEGATIVE mg/dL   Protein, ur NEGATIVE  NEGATIVE mg/dL   Urobilinogen, UA 1.0  0.0 - 1.0 mg/dL   Nitrite NEGATIVE  NEGATIVE   Leukocytes, UA NEGATIVE  NEGATIVE  TROPONIN I      Component Value Range   Troponin I <0.30  <0.30 ng/mL  LACTIC ACID, PLASMA      Component Value Range   Lactic Acid, Venous 1.4  0.5 - 2.2 mmol/L  POCT I-STAT 3, BLOOD GAS (G3P V)      Component Value Range   pH, Ven 7.387 (*) 7.250 - 7.300   pCO2, Ven 45.0  45.0 - 50.0 mmHg   pO2, Ven 39.0  30.0 - 45.0 mmHg   Bicarbonate 27.1 (*) 20.0 - 24.0 mEq/L   TCO2 28  0 - 100 mmol/L   O2 Saturation 72.0     Acid-Base Excess 1.0  0.0 - 2.0 mmol/L   Sample type VENOUS     Comment NOTIFIED PHYSICIAN    URINE MICROSCOPIC-ADD ON      Component Value Range   WBC, UA 3-6  <3 WBC/hpf   Bacteria, UA FEW (*) RARE   Dg Chest 2 View  05/04/2012  *RADIOLOGY REPORT*  Clinical Data: Hemoptysis.  CHEST - 2 VIEW  Comparison: April 27, 2012.  Findings: Cardiomediastinal silhouette appears normal. Interstitial densities are noted in both lung bases which are unchanged and consistent with scarring.  No acute pulmonary disease is noted.  IMPRESSION: No acute cardiopulmonary abnormality seen.   Original Report Authenticated By: Lupita Raider.,  M.D.    Dg Chest 2 View  04/27/2012  *RADIOLOGY REPORT*  Clinical Data: Shortness of breath, cough  CHEST - 2 VIEW  Comparison: 04/22/2012  Findings: Normal heart size and stable prominent central vascularity.  Slight diffuse interstitial  changes, unchanged.  No definite superimposed edema or pneumonia.  Remote left rib fractures evident.  Coronary stents identified.  Trachea is midline.  No collapse, consolidation, effusion or pneumothorax.  IMPRESSION: Stable chronic changes.  No superimposed acute process   Original Report Authenticated By: Judie Petit. Miles Costain, M.D.    Abd 1 View (kub)  05/05/2012  *RADIOLOGY REPORT*  Clinical Data: Nausea and abdominal pain.  ABDOMEN - 1 VIEW  Comparison: Chest and two views abdomen 04/22/2012.  CT abdomen and pelvis 03/28/2012.  Findings: There is gaseous distention of small bowel loops measuring up to 4.9 cm.  There is some gas and stool in the colon. Cholecystectomy clips and spinal stabilization hardware noted.  IMPRESSION: Abnormal bowel gas pattern compatible with partial or early complete small bowel obstruction.   Original Report Authenticated By: Holley Dexter, M.D.    Ct Head Wo Contrast  05/25/2012  *RADIOLOGY REPORT*  Clinical Data: Generalized weakness.  Fall.  Nasal congestion. A left sided headache.  Left upper and lower extremity weakness. Nausea and vomiting.  CT HEAD WITHOUT CONTRAST  Technique:  Contiguous axial images were obtained from the base of the skull through the vertex without contrast.  Comparison: CT head without contrast 11/01/2011.  Findings: No acute cortical infarct, hemorrhage, or mass lesion is present.  The ventricles are of normal size.  Mild generalized atrophy is stable.  No significant extra-axial fluid collection is present.  The paranasal sinuses and mastoid air cells are clear.  The osseous skull is intact.  No significant extracranial soft tissue injury is evident.  IMPRESSION:  1.  No acute intracranial abnormality or significant interval change. 2.  Mild  generalized atrophy is stable.   Original Report Authenticated By: Marin Roberts, M.D.    Ct Angio Chest Pe W/cm &/or Wo Cm  04/27/2012  *RADIOLOGY REPORT*  Clinical Data: Short of breath  CT ANGIOGRAPHY CHEST   Technique:  Multidetector CT imaging of the chest using the standard protocol during bolus administration of intravenous contrast. Multiplanar reconstructed images including MIPs were obtained and reviewed to evaluate the vascular anatomy.  Contrast: OMNIPAQUE IOHEXOL 350 MG/ML SOLN  Comparison: 11/13/2010  Findings: No filling defects in the pulmonary arterial tree to suggest acute pulmonary thromboembolism.  Ill-defined left thyroid lesion is not significantly changed.  Stent in the left anterior descending coronary artery. Aberrant right subclavian artery, a normal variant.  Small scattered mediastinal nodes are improved.  Pericardial effusion.  No pleural effusion.  No pneumothorax.  Patchy bilateral ground-glass opacities without a particular predilection are noted.  Areas of interstitial prominence are also noted.  Chronic left-sided rib deformities.  No definite acute rib fracture.  IMPRESSION: No evidence of acute pulmonary thromboembolism.  Stable ill-defined left thyroid hypodensity.  Patchy ground-glass opacities as described worrisome for inflammatory process.   Original Report Authenticated By: Jolaine Click, M.D.    Dg Abd 2 Views  05/06/2012  *RADIOLOGY REPORT*  Clinical Data: A small bowel obstruction with left abdominal pain.  ABDOMEN - 2 VIEW  Comparison: 05/04/2012  Findings: Upright film shows no evidence for intraperitoneal free air. Supine abdomen shows interval resolution of the gaseous small bowel distention seen centrally on the previous study.  There is now increased gas volume within the transverse colon.  IMPRESSION: Interval resolution of gaseous small bowel dilatation with increased gas now visible in the mid colon.   Original Report Authenticated By: Kennith Center, M.D.    Dg Abd Acute W/chest  05/25/2012  *RADIOLOGY REPORT*  Clinical Data: Left upper chest pain.  Mid abdominal pain.  Nausea. Recent fall.  ACUTE ABDOMEN SERIES (ABDOMEN 2 VIEW & CHEST 1 VIEW)  Comparison: Chest  radiograph 05/04/2012.  Findings: Lung volumes are low.  There is no plain film evidence of free air.  Cardiopericardial silhouette appears within normal limits.  No focal airspace opacity. Cholecystectomy clips are present in the right upper quadrant.  Bowel gas pattern is nonobstructive.  Lumbar fusion is present.  Stool and bowel gas extends to the level of the rectosigmoid.  IMPRESSION:  1.  Nonobstructive bowel gas pattern. 2.  Low volume chest.   Original Report Authenticated By: Andreas Newport, M.D.    No results found.   No diagnosis found. 1. Abdominal pain 2. Chronic pain 3. N, v   MDM  He arrives via private vehicle somnolent but oriented. Speech is slurred and somewhat incoherent. He is easily arousable. Over the course of the ED visit, he received fluids, antiemetics and pain medication. He is drinking without vomiting. Records reviewed. Heat CT without finding.   On re-eval:  He is awake, conversive, coherent. He reports being out of a number of his medications. He has community follow up including GI.  Feel he is stable for discharge home.       Arnoldo Hooker, PA-C 05/25/12 1245

## 2012-05-25 NOTE — ED Notes (Signed)
Urinal at bedside. Pt aware of need for urine sample 

## 2012-05-25 NOTE — ED Notes (Signed)
Shari, PA at the bedside.  

## 2012-05-27 LAB — URINE CULTURE

## 2012-05-28 ENCOUNTER — Telehealth (HOSPITAL_COMMUNITY): Payer: Self-pay | Admitting: Emergency Medicine

## 2012-05-28 NOTE — ED Notes (Signed)
Results received from [facility]. (+) URNC-> >/= 100,000 colonies, E Coli.  No Abx Rx given in ED.  Chart to MD office for review.

## 2012-05-29 NOTE — ED Notes (Signed)
Chart returned from EDP office written by Lemont Fillers for Kelfex 500 mg tab # 14 one tab po BID x 7 days needs to be called pharmacy.

## 2012-05-30 ENCOUNTER — Telehealth (HOSPITAL_COMMUNITY): Payer: Self-pay | Admitting: Emergency Medicine

## 2012-05-31 ENCOUNTER — Telehealth (HOSPITAL_COMMUNITY): Payer: Self-pay | Admitting: Emergency Medicine

## 2012-06-01 ENCOUNTER — Encounter (HOSPITAL_COMMUNITY): Payer: Self-pay | Admitting: Emergency Medicine

## 2012-06-01 ENCOUNTER — Emergency Department (HOSPITAL_COMMUNITY): Payer: Medicare Other

## 2012-06-01 ENCOUNTER — Inpatient Hospital Stay (HOSPITAL_COMMUNITY)
Admission: EM | Admit: 2012-06-01 | Discharge: 2012-06-08 | DRG: 392 | Disposition: A | Discharge status: Skilled Nursing Facility | Payer: Medicare Other | Attending: Internal Medicine | Admitting: Internal Medicine

## 2012-06-01 DIAGNOSIS — F1120 Opioid dependence, uncomplicated: Secondary | ICD-10-CM

## 2012-06-01 DIAGNOSIS — F329 Major depressive disorder, single episode, unspecified: Secondary | ICD-10-CM | POA: Diagnosis present

## 2012-06-01 DIAGNOSIS — M545 Low back pain: Secondary | ICD-10-CM

## 2012-06-01 DIAGNOSIS — R112 Nausea with vomiting, unspecified: Secondary | ICD-10-CM

## 2012-06-01 DIAGNOSIS — R109 Unspecified abdominal pain: Secondary | ICD-10-CM

## 2012-06-01 DIAGNOSIS — B370 Candidal stomatitis: Secondary | ICD-10-CM | POA: Diagnosis present

## 2012-06-01 DIAGNOSIS — IMO0001 Reserved for inherently not codable concepts without codable children: Secondary | ICD-10-CM | POA: Diagnosis present

## 2012-06-01 DIAGNOSIS — F3289 Other specified depressive episodes: Secondary | ICD-10-CM | POA: Diagnosis present

## 2012-06-01 DIAGNOSIS — K5903 Drug induced constipation: Secondary | ICD-10-CM

## 2012-06-01 DIAGNOSIS — F4310 Post-traumatic stress disorder, unspecified: Secondary | ICD-10-CM | POA: Diagnosis present

## 2012-06-01 DIAGNOSIS — R197 Diarrhea, unspecified: Secondary | ICD-10-CM

## 2012-06-01 DIAGNOSIS — F17200 Nicotine dependence, unspecified, uncomplicated: Secondary | ICD-10-CM | POA: Diagnosis present

## 2012-06-01 DIAGNOSIS — I10 Essential (primary) hypertension: Secondary | ICD-10-CM | POA: Diagnosis present

## 2012-06-01 DIAGNOSIS — F341 Dysthymic disorder: Secondary | ICD-10-CM

## 2012-06-01 DIAGNOSIS — E860 Dehydration: Secondary | ICD-10-CM

## 2012-06-01 DIAGNOSIS — E669 Obesity, unspecified: Secondary | ICD-10-CM | POA: Diagnosis present

## 2012-06-01 DIAGNOSIS — E785 Hyperlipidemia, unspecified: Secondary | ICD-10-CM | POA: Diagnosis present

## 2012-06-01 DIAGNOSIS — R5381 Other malaise: Secondary | ICD-10-CM | POA: Diagnosis present

## 2012-06-01 DIAGNOSIS — Z8719 Personal history of other diseases of the digestive system: Secondary | ICD-10-CM

## 2012-06-01 DIAGNOSIS — R739 Hyperglycemia, unspecified: Secondary | ICD-10-CM

## 2012-06-01 DIAGNOSIS — K56609 Unspecified intestinal obstruction, unspecified as to partial versus complete obstruction: Secondary | ICD-10-CM

## 2012-06-01 DIAGNOSIS — E871 Hypo-osmolality and hyponatremia: Secondary | ICD-10-CM | POA: Diagnosis present

## 2012-06-01 DIAGNOSIS — F411 Generalized anxiety disorder: Secondary | ICD-10-CM | POA: Diagnosis present

## 2012-06-01 DIAGNOSIS — K219 Gastro-esophageal reflux disease without esophagitis: Secondary | ICD-10-CM | POA: Diagnosis present

## 2012-06-01 DIAGNOSIS — R933 Abnormal findings on diagnostic imaging of other parts of digestive tract: Secondary | ICD-10-CM

## 2012-06-01 DIAGNOSIS — M549 Dorsalgia, unspecified: Secondary | ICD-10-CM | POA: Diagnosis present

## 2012-06-01 DIAGNOSIS — E876 Hypokalemia: Secondary | ICD-10-CM | POA: Diagnosis present

## 2012-06-01 DIAGNOSIS — Z86718 Personal history of other venous thrombosis and embolism: Secondary | ICD-10-CM

## 2012-06-01 DIAGNOSIS — K3184 Gastroparesis: Principal | ICD-10-CM | POA: Diagnosis present

## 2012-06-01 DIAGNOSIS — T40605A Adverse effect of unspecified narcotics, initial encounter: Secondary | ICD-10-CM | POA: Diagnosis present

## 2012-06-01 DIAGNOSIS — I2510 Atherosclerotic heart disease of native coronary artery without angina pectoris: Secondary | ICD-10-CM | POA: Diagnosis present

## 2012-06-01 DIAGNOSIS — K259 Gastric ulcer, unspecified as acute or chronic, without hemorrhage or perforation: Secondary | ICD-10-CM

## 2012-06-01 DIAGNOSIS — F419 Anxiety disorder, unspecified: Secondary | ICD-10-CM | POA: Diagnosis present

## 2012-06-01 DIAGNOSIS — E119 Type 2 diabetes mellitus without complications: Secondary | ICD-10-CM

## 2012-06-01 LAB — COMPREHENSIVE METABOLIC PANEL
ALT: 5 U/L (ref 0–53)
Alkaline Phosphatase: 86 U/L (ref 39–117)
BUN: 9 mg/dL (ref 6–23)
CO2: 29 mEq/L (ref 19–32)
Calcium: 10.3 mg/dL (ref 8.4–10.5)
GFR calc Af Amer: >90 mL/min (ref >90)
GFR calc non Af Amer: >90 mL/min (ref >90)
Glucose, Bld: 316 mg/dL — ABNORMAL HIGH (ref 70–99)
Potassium: 3.4 mEq/L — ABNORMAL LOW (ref 3.5–5.1)
Sodium: 140 mEq/L (ref 135–145)
Total Protein: 8.1 g/dL (ref 6.0–8.3)

## 2012-06-01 LAB — CBC WITH DIFFERENTIAL/PLATELET
Eosinophils Absolute: 0 10*3/uL (ref 0.0–0.7)
Eosinophils Relative: 0 % (ref 0–5)
HCT: 50.6 % (ref 39.0–52.0)
Hemoglobin: 17.2 g/dL — ABNORMAL HIGH (ref 13.0–17.0)
Lymphocytes Relative: 24 % (ref 12–46)
Lymphs Abs: 1.9 10*3/uL (ref 0.7–4.0)
MCH: 32.1 pg (ref 26.0–34.0)
MCV: 94.6 fL (ref 78.0–100.0)
Monocytes Relative: 10 % (ref 3–12)
Platelets: 247 10*3/uL (ref 150–400)
RBC: 5.35 MIL/uL (ref 4.22–5.81)
WBC: 7.8 10*3/uL (ref 4.0–10.5)

## 2012-06-01 LAB — CREATININE, SERUM
Creatinine, Ser: 0.66 mg/dL (ref 0.50–1.35)
GFR calc Af Amer: >90 mL/min (ref >90)
GFR calc non Af Amer: >90 mL/min (ref >90)

## 2012-06-01 LAB — HEMOGLOBIN A1C
Hgb A1c MFr Bld: 12.7 % — ABNORMAL HIGH (ref <5.7)
Mean Plasma Glucose: 318 mg/dL — ABNORMAL HIGH (ref <117)

## 2012-06-01 LAB — LIPASE, BLOOD: Lipase: 23 U/L (ref 11–59)

## 2012-06-01 LAB — HEPATIC FUNCTION PANEL
AST: 13 U/L (ref 0–37)
Albumin: 4 g/dL (ref 3.5–5.2)
Total Protein: 8 g/dL (ref 6.0–8.3)

## 2012-06-01 LAB — URINALYSIS, ROUTINE W REFLEX MICROSCOPIC
Hgb urine dipstick: NEGATIVE
Nitrite: NEGATIVE
Protein, ur: 100 mg/dL — AB
Urobilinogen, UA: 1 mg/dL (ref 0.0–1.0)

## 2012-06-01 LAB — GLUCOSE, CAPILLARY: Glucose-Capillary: 247 mg/dL — ABNORMAL HIGH (ref 70–99)

## 2012-06-01 LAB — URINE MICROSCOPIC-ADD ON

## 2012-06-01 MED ORDER — ONDANSETRON HCL 4 MG/2ML IJ SOLN
4.0000 mg | Freq: Once | INTRAMUSCULAR | Status: AC
  Administered 2012-06-01: 4 mg via INTRAVENOUS
  Filled 2012-06-01: qty 2

## 2012-06-01 MED ORDER — SODIUM CHLORIDE 0.9 % IV SOLN
1000.0000 mL | Freq: Once | INTRAVENOUS | Status: AC
  Administered 2012-06-01: 1000 mL via INTRAVENOUS

## 2012-06-01 MED ORDER — ONDANSETRON HCL 4 MG/2ML IJ SOLN: 4.0000 mg | Freq: Four times a day (QID) | INTRAMUSCULAR | Status: DC | PRN

## 2012-06-01 MED ORDER — HYDROMORPHONE HCL PF 1 MG/ML IJ SOLN
INTRAMUSCULAR | Status: AC
  Filled 2012-06-01: qty 2

## 2012-06-01 MED ORDER — HYDROMORPHONE HCL PF 1 MG/ML IJ SOLN
1.0000 mg | Freq: Once | INTRAMUSCULAR | Status: AC
  Administered 2012-06-01: 1 mg via INTRAVENOUS
  Filled 2012-06-01: qty 1

## 2012-06-01 MED ORDER — WHITE PETROLATUM GEL
Status: AC
  Administered 2012-06-01: 12:00:00
  Filled 2012-06-01: qty 5

## 2012-06-01 MED ORDER — HEPARIN SODIUM (PORCINE) 5000 UNIT/ML IJ SOLN
5000.0000 [IU] | Freq: Three times a day (TID) | INTRAMUSCULAR | Status: DC
  Administered 2012-06-01 – 2012-06-08 (×21): 5000 [IU] via SUBCUTANEOUS
  Filled 2012-06-01 (×24): qty 1

## 2012-06-01 MED ORDER — LOSARTAN POTASSIUM 50 MG PO TABS
100.0000 mg | ORAL_TABLET | Freq: Every day | ORAL | Status: DC
  Administered 2012-06-01 – 2012-06-08 (×8): 100 mg via ORAL
  Filled 2012-06-01 (×8): qty 2

## 2012-06-01 MED ORDER — PANTOPRAZOLE SODIUM 40 MG PO TBEC
40.0000 mg | DELAYED_RELEASE_TABLET | Freq: Two times a day (BID) | ORAL | Status: DC
  Administered 2012-06-01 – 2012-06-08 (×15): 40 mg via ORAL
  Filled 2012-06-01 (×15): qty 1

## 2012-06-01 MED ORDER — GABAPENTIN 400 MG PO CAPS
400.0000 mg | ORAL_CAPSULE | Freq: Three times a day (TID) | ORAL | Status: DC
  Administered 2012-06-01 – 2012-06-08 (×22): 400 mg via ORAL
  Filled 2012-06-01 (×24): qty 1

## 2012-06-01 MED ORDER — POTASSIUM CHLORIDE CRYS ER 20 MEQ PO TBCR
20.0000 meq | EXTENDED_RELEASE_TABLET | Freq: Every day | ORAL | Status: DC
  Administered 2012-06-01 – 2012-06-08 (×8): 20 meq via ORAL
  Filled 2012-06-01 (×8): qty 1

## 2012-06-01 MED ORDER — ACETAMINOPHEN 325 MG PO TABS: 650.0000 mg | ORAL_TABLET | Freq: Four times a day (QID) | ORAL | Status: DC | PRN

## 2012-06-01 MED ORDER — METOCLOPRAMIDE HCL 10 MG PO TABS
10.0000 mg | ORAL_TABLET | Freq: Three times a day (TID) | ORAL | Status: DC
  Administered 2012-06-01 – 2012-06-08 (×29): 10 mg via ORAL
  Filled 2012-06-01 (×34): qty 1

## 2012-06-01 MED ORDER — NYSTATIN 100000 UNIT/ML MT SUSP
5.0000 mL | Freq: Four times a day (QID) | OROMUCOSAL | Status: DC
  Administered 2012-06-01 – 2012-06-08 (×28): 500000 [IU] via OROMUCOSAL
  Filled 2012-06-01 (×31): qty 5

## 2012-06-01 MED ORDER — ALPRAZOLAM 0.5 MG PO TABS
1.0000 mg | ORAL_TABLET | Freq: Two times a day (BID) | ORAL | Status: DC | PRN
  Administered 2012-06-02 – 2012-06-08 (×14): 1 mg via ORAL
  Filled 2012-06-01: qty 1
  Filled 2012-06-01 (×2): qty 2
  Filled 2012-06-01 (×2): qty 1
  Filled 2012-06-01 (×3): qty 2
  Filled 2012-06-01: qty 1
  Filled 2012-06-01: qty 2
  Filled 2012-06-01 (×2): qty 1
  Filled 2012-06-01 (×2): qty 2
  Filled 2012-06-01 (×2): qty 1
  Filled 2012-06-01 (×2): qty 2

## 2012-06-01 MED ORDER — ACETAMINOPHEN 650 MG RE SUPP: 650.0000 mg | Freq: Four times a day (QID) | RECTAL | Status: DC | PRN

## 2012-06-01 MED ORDER — SODIUM CHLORIDE 0.9 % IV SOLN: 1000.0000 mL | INTRAVENOUS | Status: DC

## 2012-06-01 MED ORDER — INSULIN GLARGINE 100 UNIT/ML ~~LOC~~ SOLN
8.0000 [IU] | Freq: Every day | SUBCUTANEOUS | Status: DC
  Administered 2012-06-01 – 2012-06-07 (×7): 8 [IU] via SUBCUTANEOUS

## 2012-06-01 MED ORDER — HYDROMORPHONE HCL PF 1 MG/ML IJ SOLN
2.0000 mg | INTRAMUSCULAR | Status: DC | PRN
  Administered 2012-06-01 – 2012-06-04 (×18): 2 mg via INTRAVENOUS
  Filled 2012-06-01: qty 2
  Filled 2012-06-01: qty 1
  Filled 2012-06-01 (×5): qty 2
  Filled 2012-06-01: qty 1
  Filled 2012-06-01: qty 2
  Filled 2012-06-01 (×2): qty 1
  Filled 2012-06-01 (×5): qty 2
  Filled 2012-06-01: qty 1
  Filled 2012-06-01 (×2): qty 2

## 2012-06-01 MED ORDER — LACTULOSE 10 GM/15ML PO SOLN
20.0000 g | Freq: Two times a day (BID) | ORAL | Status: DC
  Administered 2012-06-02 – 2012-06-06 (×4): 20 g via ORAL
  Filled 2012-06-01 (×16): qty 30

## 2012-06-01 MED ORDER — ASPIRIN EC 81 MG PO TBEC
81.0000 mg | DELAYED_RELEASE_TABLET | Freq: Every day | ORAL | Status: DC
  Administered 2012-06-01 – 2012-06-08 (×8): 81 mg via ORAL
  Filled 2012-06-01 (×8): qty 1

## 2012-06-01 MED ORDER — ONDANSETRON HCL 4 MG PO TABS
4.0000 mg | ORAL_TABLET | Freq: Four times a day (QID) | ORAL | Status: DC | PRN
  Administered 2012-06-01: 4 mg via ORAL
  Filled 2012-06-01: qty 1

## 2012-06-01 MED ORDER — INSULIN ASPART 100 UNIT/ML ~~LOC~~ SOLN
0.0000 [IU] | Freq: Three times a day (TID) | SUBCUTANEOUS | Status: DC
  Administered 2012-06-01 (×2): 8 [IU] via SUBCUTANEOUS
  Administered 2012-06-02 (×3): 5 [IU] via SUBCUTANEOUS
  Administered 2012-06-03: 2 [IU] via SUBCUTANEOUS
  Administered 2012-06-03: 3 [IU] via SUBCUTANEOUS
  Administered 2012-06-03: 5 [IU] via SUBCUTANEOUS
  Administered 2012-06-04: 2 [IU] via SUBCUTANEOUS
  Administered 2012-06-04: 5 [IU] via SUBCUTANEOUS
  Administered 2012-06-04 – 2012-06-05 (×2): 3 [IU] via SUBCUTANEOUS
  Administered 2012-06-05 – 2012-06-06 (×5): 5 [IU] via SUBCUTANEOUS
  Administered 2012-06-07: 8 [IU] via SUBCUTANEOUS
  Administered 2012-06-07: 5 [IU] via SUBCUTANEOUS
  Administered 2012-06-07: 3 [IU] via SUBCUTANEOUS
  Administered 2012-06-08: 8 [IU] via SUBCUTANEOUS
  Administered 2012-06-08: 3 [IU] via SUBCUTANEOUS

## 2012-06-01 MED ORDER — DULOXETINE HCL 30 MG PO CPEP
30.0000 mg | ORAL_CAPSULE | Freq: Every day | ORAL | Status: DC
  Administered 2012-06-01 – 2012-06-02 (×2): 30 mg via ORAL
  Filled 2012-06-01 (×3): qty 1

## 2012-06-01 MED ORDER — SODIUM CHLORIDE 0.9 % IV SOLN
INTRAVENOUS | Status: AC
  Administered 2012-06-01 (×2): via INTRAVENOUS

## 2012-06-01 NOTE — ED Notes (Signed)
Patient currently back from x-ray; currently sitting up in bed; no respiratory or acute distress noted. Patient updated on plan of care; informed patient that we need a urine sample.  Patient states that he cannot go at this time; denies any other needs at this time; will continue to monitor.

## 2012-06-01 NOTE — ED Notes (Signed)
Patient reports that one mg of Dilaudid is not enough; states, "As much pain as I'm in, I need three or four mg".  EDP notified that patient wanting more medication.  Will continue to monitor.

## 2012-06-01 NOTE — Consult Note (Signed)
[facility] Consult: 12:39 PM 06/01/2012   Referring Provider: Gwenlyn Perking Primary Care Physician:  Warrick Parisian, MD Primary Gastroenterologist:  Dr. Yancey Flemings  Reason for Consultation:  Nausea, vomiting, abdominal pain.  Acute on chronic.   HPI: Ryan [name] is a 62 y.o. male.  Obese man with IDDM (A1c 2 weeks ago was 12.3).  Required multiple admissions in [place] for the GI sxs that were diagnosed as pancreatitis that ultimately led to Cholecystectomy around 2009.   However has never had confirmation of acute or chronic pancreatitis by imaging in [place]. Lipase has reached as high as 155 in 12/2011 but is often well within normal limits. LFTs are normal consistently.  Takes chronic narcotics which MDs have suggested/attempted to wean but he still uses these for mgt of back and abdominal pain. Pt says he has fewer bouts of GI attacks since the Cholecystectomy.   Multiple admits in May, June, July, September, November 2013, Jan 2014 all with flares of pain in abdomen/epigastrum and n/v.  Additionally has visited ED for sxs as well.    Had ulcerative esophagitis and abnormal looking ampulla on 08/2011 EGD Repeated EGD in 12/2011 showed slightly abnormal ampulla but biopsy of this was benign.  Pt has never returned to see Dr Marina Goodell since ROV in 11/2011.   Weight in 12/2011 was 315 #.  It is 312 # currently.   Imaging includes: CT scans CT abd/pelvis x 5 from June to November 2013. Findings include nonspecific left renal lesion, right adrenal adenoma, scatteredcalcifications of distal aorta and branches, colonic diverticulosis.   In 11/2011 there was mild main pancreatic and central biliary ductal prominence to the level of soft tissue fullness at the ampulla. This can also be better characterized at MRI with the addition of an MRCP. By CT of 12/2011 "The liver is unremarkable and stable. No worrisome hepatic lesions. There is stable mild  intrahepatic biliary dilatation likely due to prior cholecystectomy. This is a stable finding. The common bile duct is stable in caliber. The pancreatic duct is slightly prominent but also unchanged. The pancreas is normal."  Plain films Beginning in 02/2012, plain films were showing mild to moderate SB obstructive patterns. This pattern continued through 06/01/2012 films. (seen by gen surg 02/2012, Dr Michaell Cowing was not impressed by possibility of SBO, felt pt had gastroenteritis).   Nuclear gastric emptying study 09/2011 Normal study  At discharge of 05/07/2012 pertinent meds included BID lactulose, Reglan 10 mg q 6 prn, Oxycodone 30 mg q 4 hours pen, Oxycodone 60 mg q 12 hours (not prn), Protonix 40 mg daily or Ranitidine 300 mg BID, Senekot bid, zofran prn, ASA 81 mg per day. He takes either the Zantac or the Protonix.  Uses 30 mg of Oxycodone 4 to 6 times a day.   Since discharge 2 weeks ago, has had ongoing nausea, abd pain and vomiting.  These got worse in the last 4 days.  Sxs fail to improve with the above meds. Today he was readmitted by Dr Gwenlyn Perking with 3 days of worse than normal abd pain, n/v.  Says he can not deep anything down.  Sugars are in the 300s, K is 3.4.  BUN and Creatinine are normal.  Abdominal films again showing mild SB distention and AF levels. Has been having mostly daily BMs.  No blood in stool, urine, emesis.    Past Medical History  Diagnosis Date  . Hypertension   . DVT (deep venous thrombosis) ~ 2011    BLE  .  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 Hyperlipidemia   . CAD (coronary atherosclerotic disease)     s/p stents x2  . Small bowel obstruction   . Pancreatitis   . Anxiety   . GERD (gastroesophageal reflux disease)   . CHF (congestive heart failure)   . Myocardial infarction ~ 2009  . Anginal pain   . Pneumonia ~ 2012  . Shortness of breath     "lying down & w/exertion" (03/04/2012)  . Type II diabetes mellitus   . Hep B w/o coma  1970's    "I was in [place]" (03/04/2012)  . Chronic lower back pain   . PTSD (post-traumatic stress disorder)     Past Surgical History  Procedure Date  . Esophagogastroduodenoscopy 09/22/2011    Procedure: ESOPHAGOGASTRODUODENOSCOPY (EGD);  Surgeon: Charna Elizabeth, MD;  Location[facility] ENDOSCOPY;  Service: Endoscopy;  Laterality: N/A;  . Esophagogastroduodenoscopy 01/05/2012    Procedure: ESOPHAGOGASTRODUODENOSCOPY (EGD);  Surgeon: Rachael Fee, MD;  Location: [facility] ENDOSCOPY;  Service: Endoscopy;  Laterality: N/A;  . Cholecystectomy ~ 2009  . Spinal growth rods ~ 2011  . Tonsillectomy and adenoidectomy   . Coronary angioplasty with stent placement ~ 2009`    "1 + ! (after 1st one failed)" (03/04/2012)    Prior to Admission medications   Medication Sig Start Date End Date Taking? Authorizing Provider  ALPRAZolam Prudy Feeler) 1 MG tablet Take 1 tablet (1 mg total) by mouth 2 (two) times daily. 05/25/12  Yes Shari A Upstill, PA-C  aspirin EC 81 MG tablet Take 81 mg by mouth daily.   Yes Historical Provider, MD  DULoxetine (CYMBALTA) 30 MG capsule Take 1 capsule (30 mg total) by mouth daily. 11/04/11 11/03/12 Yes Shanker Levora Dredge, MD  gabapentin (NEURONTIN) 400 MG capsule Take 1 capsule (400 mg total) by mouth 3 (three) times daily. 05/25/12  Yes Shari A Upstill, PA-C  glipiZIDE (GLUCOTROL XL) 10 MG 24 hr tablet Take 10 mg by mouth 2 (two) times daily.   Yes Historical Provider, MD  insulin lispro protamine-insulin lispro (HUMALOG 75/25) (75-25) 100 UNIT/ML SUSP Inject 12 Units into the skin 2 (two) times daily with a meal.   Yes Historical Provider, MD  lactulose (CHRONULAC) 10 GM/15ML solution Take 20 g by mouth 2 (two) times daily.   Yes Historical Provider, MD  losartan (COZAAR) 100 MG tablet Take 100 mg by mouth daily.   Yes Historical Provider, MD  metFORMIN (GLUCOPHAGE) 850 MG tablet Take 850 mg by mouth 3 (three) times daily.   Yes Historical Provider, MD  metoCLOPramide (REGLAN) 10 MG tablet Take 10  mg by mouth every 6 (six) hours as needed. For nausea 02/05/12  Yes Flint Melter, MD  ondansetron (ZOFRAN) 4 MG tablet Take 4 mg by mouth every 6 (six) hours as needed. For nausea 01/23/12  Yes Tiffany Irine Seal, PA  oxycodone (ROXICODONE) 30 MG immediate release tablet Take 1 tablet (30 mg total) by mouth every 4 (four) hours as needed. For breakthrough pain 05/25/12  Yes Shari A Upstill, PA-C  OxyCODONE HCl ER 60 MG T12A Take 1 tablet by mouth every 12 (twelve) hours. 05/07/12  Yes Marinda Elk, MD  pantoprazole (PROTONIX) 40 MG tablet Take 40 mg by mouth daily.   Yes Historical Provider, MD  Ranitidine HCl (ZANTAC PO) Take 300 mg by mouth 2 (two) times daily.    Yes Historical Provider, MD  sennosides-docusate sodium (SENOKOT-S) 8.6-50 MG tablet Take 2 tablets  by mouth daily. For constipation 03/06/12  Yes Shanker Levora Dredge, MD    Scheduled Meds:    . sodium chloride   Intravenous STAT  . aspirin EC  81 mg Oral Daily  . DULoxetine  30 mg Oral Daily  . gabapentin  400 mg Oral TID  . heparin  5,000 Units Subcutaneous Q8H  . HYDROmorphone      . insulin aspart  0-15 Units Subcutaneous TID WC  . insulin glargine  8 Units Subcutaneous QHS  . lactulose  20 g Oral BID  . losartan  100 mg Oral Daily  . metoCLOPramide  10 mg Oral TID AC & HS  . nystatin  5 mL Mouth/Throat QID  . pantoprazole  40 mg Oral BID  . potassium chloride  20 mEq Oral Daily  . white petrolatum       Infusions:   PRN Meds: acetaminophen, acetaminophen, ALPRAZolam, HYDROmorphone (DILAUDID) injection, ondansetron (ZOFRAN) IV, ondansetron   Allergies as of 06/01/2012 - Review Complete 06/01/2012  Allergen Reaction Noted  . Lisinopril Swelling and Rash 02/06/2011  . Morphine and related Swelling and Rash 02/06/2011    Family History  Problem Relation Age of Onset  . Diabetes Father   . Colon cancer Neg Hx     History   Social History  . Marital Status: Legally Separated    Spouse Name: N/A    Number  of Children: 1  . Years of Education: N/A   Occupational History  . Disabled    Social History Main Topics  . Smoking status: Current Every Day Smoker -- 0.5 packs/day for 38 years    Types: Cigarettes  . Smokeless tobacco: Never Used  . 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 Drug Use: Yes    Special: Marijuana     Comment: 03/04/2012 "last marijuana ~ 15-20 yr ago"  . Sexually Active: Not Currently    REVIEW OF SYSTEMS: Constitutional:  Feels weak ENT:  No nose bleeds.  Has sores in his mouth.  Pulm:  No cough or SOB.  Smokes 5 to 10 cigs per day. CV:  No chest pain, no syncope, no palpitations.  Periodic swelling in legs GU:  urine is deep yellow, at home had decreased urine output GI:  Per HPI Heme:  No hx of anemia.    Transfusions:  None MS:  Chronic back pain.  Neuro:  No headaches, no falls, no paresthesia Derm:  No sores, no itching, no rash, skin is very dry Endocrine:  Thirsty but unable to keep fluids down.  Immunization:  Up to date on flu shot Travel:  none  PHYSICAL EXAM: Vital signs in last 24 hours: Temp:  [99.1 F (37.3 C)] 99.1 F (37.3 C) (02/03 0521) Pulse Rate:  [75-97] 81  (02/03 1012) Resp:  [11-16] 16  (02/03 1012) BP: (130-147)/(75-92) 130/92 mmHg (02/03 1012) SpO2:  [93 %-100 %] 96 % (02/03 1012) Weight:  [141.613 kg (312 lb 3.2 oz)] 141.613 kg (312 lb 3.2 oz) (02/03 1058)  General: obese, unwell looking AAM. Head:  No asymmetry or facial edema  Eyes:  No icterus or conj pallor Ears:  Not HOH  Nose:  No discharge or congestion Mouth:  Moist, pink mucosa, ulcer on lower left jaw.  Multiple teeth are missing.  Some, not extensive caries present.  Neck:  No mass, no JVD,  no bruits Lungs:  Clear B Heart: RRR, no mrg Abdomen:  Obese, soft, ND, minor non-focal upper abd pain.  No guard or rebound. BS active Rectal: not done   Musc/Skeltl: no joint   Extremities:  Slight, non-pitting pedal edema Neurologic:  No tremor, no confusion, oriented x 3.  No excessive drowsiness.  Tattoos:  None Nodes: no cervical adenopathy Psych:  Pleasant, cooperative.   LAB RESULTS:  Basename 06/01/12 0555  WBC 7.8  HGB 17.2*  HCT 50.6  PLT 247   BMET Lab Results  Component Value Date   NA 140 06/01/2012   NA 138 05/25/2012   NA 131* 05/06/2012   K 3.4* 06/01/2012   K 4.0 05/25/2012   K 4.4 05/06/2012   CL 99 06/01/2012   CL 103 05/25/2012   CL 97 05/06/2012   CO2 29 06/01/2012   CO2 23 05/25/2012   CO2 24 05/06/2012   GLUCOSE 316* 06/01/2012   GLUCOSE 337* 05/25/2012   GLUCOSE 318* 05/06/2012   BUN 9 06/01/2012   BUN 8 05/25/2012   BUN 10 05/06/2012   CREATININE 0.68 06/01/2012   CREATININE 0.66 06/01/2012   CREATININE 0.57 05/25/2012   CREATININE 0.86 05/06/2012   CALCIUM 10.3 06/01/2012   CALCIUM 9.6 05/25/2012   CALCIUM 8.8 05/06/2012   LFT  Basename 06/01/12 0944 06/01/12 0555  PROT 8.0 8.1  ALBUMIN 4.0 4.0  AST 13 9  ALT 6 5  ALKPHOS 84 86  BILITOT 0.4 0.3  BILIDIR <0.1 --  IBILI NOT CALCULATED --   PT/INR Lab Results  Component Value Date   INR 0.95 11/02/2011   INR 0.97 09/22/2011   INR 1.39 11/15/2010   U/A 7 -10 WBCS, Ca oxylate crystals, small leukocytes, neg nitrites, rare squams, 0-2 RBCs,   Drugs of Abuse     Component Value Date/Time   LABOPIA NONE DETECTED 02/05/2012 1318   LABOPIA NEGATIVE 11/13/2010 2022   COCAINSCRNUR NONE DETECTED 02/05/2012 1318   COCAINSCRNUR NEGATIVE 11/13/2010 2022   LABBENZ NONE DETECTED 02/05/2012 1318   LABBENZ POSITIVE* 11/13/2010 2022   AMPHETMU NONE DETECTED 02/05/2012 1318   AMPHETMU NEGATIVE 11/13/2010 2022   THCU NONE DETECTED 02/05/2012 1318   LABBARB NONE DETECTED 02/05/2012 1318     RADIOLOGY STUDIES: Dg Abd Acute W/chest 06/01/2012    IMPRESSION: Chronic bronchitic changes in the lungs.  No evidence of active pulmonary disease.  Development of mild gas distended small bowel in the mid abdomen with  air-fluid levels suggesting obstruction.   Original Report Authenticated By: Burman Nieves, M.D.     ENDOSCOPIC STUDIES: EGD 08/2011  Dr Loreta Ave (covering for [facility]) for abd pain , n/v IMPRESSION: 1) Erosive esophagitis-distal esophagus.  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  3) Prominent antral fold-biopsied.  4) Polypoid lesion on ?ampulla-not biopsied.  RECOMMENDATIONS: 1) Anti-reflux regimen to be followed.  2) Avoid NSAIDS for now.  3) Await pathology results.  4) Continue PPI's.  5) Clear liquid diet.  6) ?EUS at a later date. Pathology 1. Stomach, biopsy, proximal and GE junction ulcer - GASTROESOPHAGEAL JUNCTION MUCOSA WITH ASSOCIATED ULCERATION. - NO EVIDENCE OF INTESTINAL METAPLASIA, DYSPLASIA OR MALIGNANCY. 2. Stomach, biopsy, prominent antral folds - REACTIVE GASTROPATHY WITH FOCAL INTESTINAL METAPLASIA.  EGD 12/2011   Dr Christella Hartigan for abd pain, n/v ENDOSCOPIC IMPRESSION:  Slightly abnormal major papilla, doubt this is pathologic and very  unlikely to be contributing to his GI symptoms. Biopsies taken.  Otherwise normal examination RECOMMENDATIONS:  Treat clinically with  anitemetics.  Try to wean off such high doses of chronic narcotic meds (has been taking 60 oxycontin twice daily and also 30 oxycodone 6 times a day). These are very likely causing or at least contributing to your chronic GI symptoms.  Await final biopsies, I will commmunicate these with Dr. Marina Goodell. Pathology Duodenal Ampulla/Bulb - BENIGN REACTIVE/REGENERATIVE DUODENAL MUCOSA WITH VILLOUS DISTORTION GASTRIC FOVEOLAR METAPLASIA AND MINIMAL CHRONIC INFLAMMATION, SEE COMMENT. - NEGATIVE FOR DYSPLASIA AND MALIGNANCY. - NO GIARDIA IDENTIFIED.   IMPRESSION: *  Acute on chronic abd pain, nausea and vomiting.  Extensive radiologic, endoscopic work up since 08/2011, and at other hospitals previous to that.  The sentiment from the work up is that he needs to come off narcotics. May currently have bout of  gastroenteritis.  Despite normal GES in 09/2011, he certainly has a component of gatroparesis to his sxs.  Right now does not seem to have pancreatitis  *  IDDM, poorly controlled.  Mean plasma glucose of 306 by A1c of 05/16/11. Has no SSI coverage at home.  Seems like Insulin dosing needs adjusting.    PLAN: *  Per Dr Russella Dar.  *  He should stay on chronic PPI at home, and does not need to take the Ranitidine on top of this.  *  Continue IVF, Reglan tablets,    LOS: 0 days   Jennye Moccasin  06/01/2012, 12:39 PM Pager: [phone]   I have taken a history, examined the patient and reviewed the chart. I agree with the Advanced Practitioner's note, impression and recommendations. Extensive evaluations in the recent past so no need to repeat at this time. Recurrent abd pain/N/V of unclear etiology. Several possibilities: poorly controlled DM, symptoms related to narcotic usage, mild ileus related to narcotic usage, cyclic vomiting syndrome.  Recommend: Optimal inpatient and outpatient management and control of DM. Attempt to decrease narcotics-per his prescribing MD. PPI daily. Continue antiemetics and IVF.  Meryl Dare MD [facility]

## 2012-06-01 NOTE — ED Notes (Signed)
Patient reports that he started having abdominal pain and emesis that started two weeks ago; went away and then came back four days ago.  Patient rates pain 10/10 on the numerical pain scale.  Patient alert and oriented x4; PERRL present.  Upon arrival to room, patient changed into gown.  Dr. Preston Fleeting at bedside.

## 2012-06-01 NOTE — ED Provider Notes (Signed)
History     CSN: [id number]  Arrival date & time 06/01/12  0518   First MD Initiated Contact with Patient 06/01/12 [phone]      Chief Complaint  Patient presents with  . Abdominal Pain  . Emesis    (Consider location/radiation/quality/duration/timing/severity/associated sxs/prior treatment) Patient is a 62 y.o. male presenting with abdominal pain and vomiting. The history is provided by the patient.  Abdominal Pain The primary symptoms of the illness include abdominal pain and vomiting.  Emesis  Associated symptoms include abdominal pain.  He is complaining of severe periumbilical pain with associated nausea and vomiting. Pain started about 2 weeks ago and resolved in immediate heard about 4 days ago. Pain is severe and he rates it 10/10. There is momentary relief following the vomiting and but pain with occurs almost immediately. There's been no constipation or diarrhea and he has been having normal bowel movements. He denies fever, chills, sweats. He states that on one occasion a gastroenterologist had seen multiple lesions in his esophagus, stomach, colon but subsequent endoscopy showed no lesions. He states he has not taken anything to try and help his pain because he has been vomiting consistently.  Past Medical History  Diagnosis Date  . Hypertension   . DVT (deep venous thrombosis) ~ 2011    BLE  . 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 Hyperlipidemia   . CAD (coronary atherosclerotic disease)     s/p stents x2  . Small bowel obstruction   . Pancreatitis   . Anxiety   . GERD (gastroesophageal reflux disease)   . CHF (congestive heart failure)   . Myocardial infarction ~ 2009  . Anginal pain   . Pneumonia ~ 2012  . Shortness of breath     "lying down & w/exertion" (03/04/2012)  . Type II diabetes mellitus   . Hep B w/o coma 1970's    "I was in [place]" (03/04/2012)  . Chronic lower back pain   . PTSD (post-traumatic stress disorder)     Past  Surgical History  Procedure Date  . Esophagogastroduodenoscopy 09/22/2011    Procedure: ESOPHAGOGASTRODUODENOSCOPY (EGD);  Surgeon: Charna Elizabeth, MD;  Location: [facility] ENDOSCOPY;  Service: Endoscopy;  Laterality: N/A;  . Esophagogastroduodenoscopy 01/05/2012    Procedure: ESOPHAGOGASTRODUODENOSCOPY (EGD);  Surgeon: Rachael Fee, MD;  Location: [facility] ENDOSCOPY;  Service: Endoscopy;  Laterality: N/A;  . Cholecystectomy ~ 2009  . Spinal growth rods ~ 2011  . Tonsillectomy and adenoidectomy   . Coronary angioplasty with stent placement ~ 2009`    "1 + ! (after 1st one failed)" (03/04/2012)    Family History  Problem Relation Age of Onset  . Diabetes Father   . Colon cancer Neg Hx     History  Substance Use Topics  . Smoking status: Current Every Day Smoker -- 0.5 packs/day for 38 years    Types: Cigarettes  . Smokeless tobacco: Never Used  . 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  All other systems reviewed and are negative.    Allergies  Lisinopril and Morphine and related  Home Medications   Current Outpatient Rx  Name  Route  Sig  Dispense  Refill  . ALPRAZOLAM 1 MG PO TABS   Oral   Take  1 tablet (1 mg total) by mouth 2 (two) times daily.   16 tablet   0   . ASPIRIN EC 81 MG PO TBEC   Oral   Take 81 mg by mouth daily.         . DULOXETINE HCL 30 MG PO CPEP   Oral   Take 1 capsule (30 mg total) by mouth daily.         Marland Kitchen GABAPENTIN 400 MG PO CAPS   Oral   Take 1 capsule (400 mg total) by mouth 3 (three) times daily.   30 capsule   0   . GLIPIZIDE ER 10 MG PO TB24   Oral   Take 10 mg by mouth 2 (two) times daily.         . INSULIN LISPRO PROT & LISPRO (75-25) 100 UNIT/ML [place] SUSP   Subcutaneous   Inject 12 Units into the skin 2 (two) times daily with a meal.         . LACTULOSE 10  GM/15ML PO SOLN   Oral   Take 20 g by mouth 2 (two) times daily.         Marland Kitchen LOSARTAN POTASSIUM 100 MG PO TABS   Oral   Take 100 mg by mouth daily.         Marland Kitchen METFORMIN HCL 850 MG PO TABS   Oral   Take 850 mg by mouth 3 (three) times daily.         Marland Kitchen METOCLOPRAMIDE HCL 10 MG PO TABS   Oral   Take 10 mg by mouth every 6 (six) hours as needed. For nausea         . ONDANSETRON HCL 4 MG PO TABS   Oral   Take 4 mg by mouth every 6 (six) hours as needed. For nausea         . OXYCODONE HCL 30 MG PO TABS   Oral   Take 1 tablet (30 mg total) by mouth every 4 (four) hours as needed. For breakthrough pain   15 tablet   0   . OXYCODONE HCL ER 60 MG PO T12A   Oral   Take 1 tablet by mouth every 12 (twelve) hours.   8 each   0   . PANTOPRAZOLE SODIUM 40 MG PO TBEC   Oral   Take 40 mg by mouth daily.         Marland Kitchen PROMETHAZINE HCL 25 MG RE SUPP   Rectal   Place 1 suppository (25 mg total) rectally every 6 (six) hours as needed for nausea.   12 each   0   . ZANTAC PO   Oral   Take 300 mg by mouth 2 (two) times daily.          . SENNA-DOCUSATE SODIUM 8.6-50 MG PO TABS   Oral   Take 2 tablets by mouth daily. For constipation   60 tablet   0     BP 147/87  Pulse 97  Temp 99.1 F (37.3 C) (Oral)  Resp 14  SpO2 93%  Physical Exam  Nursing note and vitals reviewed.  62 year old male, resting comfortably and in no acute distress. Vital signs are significant for hypertension with blood pressure 147/87. Oxygen saturation is 93%, which is normal. Head is normocephalic and atraumatic. PERRLA, EOMI. Oropharynx is clear. Neck is nontender and supple without adenopathy or JVD. Back is nontender and there is no CVA tenderness. Lungs are clear without rales,  wheezes, or rhonchi. Chest is nontender. Heart has regular rate and rhythm without murmur. Abdomen is soft, flat, with the mild to moderate periumbilical tenderness. There is no rebound or guarding. There are no  masses or hepatosplenomegaly and peristalsis is hypoactive. Extremities have no cyanosis or edema, full range of motion is present. Skin is warm and dry without rash. Neurologic: Mental status is normal, cranial nerves are intact, there are no motor or sensory deficits.  ED Course  Procedures (including critical care time)  Results for orders placed during the hospital encounter of 06/01/12  CBC WITH DIFFERENTIAL      Component Value Range   WBC 7.8  4.0 - 10.5 K/uL   RBC 5.35  4.22 - 5.81 MIL/uL   Hemoglobin 17.2 (*) 13.0 - 17.0 g/dL   HCT 16.1  09.6 - 04.5 %   MCV 94.6  78.0 - 100.0 fL   MCH 32.1  26.0 - 34.0 pg   MCHC 34.0  30.0 - 36.0 g/dL   RDW 40.9  81.1 - 91.4 %   Platelets 247  150 - 400 K/uL   Neutrophils Relative 66  43 - 77 %   Neutro Abs 5.2  1.7 - 7.7 K/uL   Lymphocytes Relative 24  12 - 46 %   Lymphs Abs 1.9  0.7 - 4.0 K/uL   Monocytes Relative 10  3 - 12 %   Monocytes Absolute 0.8  0.1 - 1.0 K/uL   Eosinophils Relative 0  0 - 5 %   Eosinophils Absolute 0.0  0.0 - 0.7 K/uL   Basophils Relative 0  0 - 1 %   Basophils Absolute 0.0  0.0 - 0.1 K/uL  COMPREHENSIVE METABOLIC PANEL      Component Value Range   Sodium 140  135 - 145 mEq/L   Potassium 3.4 (*) 3.5 - 5.1 mEq/L   Chloride 99  96 - 112 mEq/L   CO2 29  19 - 32 mEq/L   Glucose, Bld 316 (*) 70 - 99 mg/dL   BUN 9  6 - 23 mg/dL   Creatinine, Ser 7.82  0.50 - 1.35 mg/dL   Calcium 95.6  8.4 - 21.3 mg/dL   Total Protein 8.1  6.0 - 8.3 g/dL   Albumin 4.0  3.5 - 5.2 g/dL   AST 9  0 - 37 U/L   ALT 5  0 - 53 U/L   Alkaline Phosphatase 86  39 - 117 U/L   Total Bilirubin 0.3  0.3 - 1.2 mg/dL   GFR calc non Af Amer >90  >90 mL/min   GFR calc Af Amer >90  >90 mL/min  LIPASE, BLOOD      Component Value Range   Lipase 23  11 - 59 U/L   Dg Abd Acute W/chest  06/01/2012  *RADIOLOGY REPORT*  Clinical Data: The abdominal pain and vomiting.  ACUTE ABDOMEN SERIES (ABDOMEN 2 VIEW & CHEST 1 VIEW)  Comparison: 05/25/2012   Findings: Normal inspiration.  Normal heart size and pulmonary vascularity.  Peribronchial thickening and interstitial changes most suggestive of chronic bronchitis.  Old left rib fracture.  No focal consolidation or airspace disease.  No blunting of costophrenic angles.  No pneumothorax.  No significant change since previous chest.  Prominent loop of gas distended small bowel with mild fold thickening in the right abdomen with suggestion of additional distended loops centrally.  A few air-fluid levels are noted on the upright view.  Findings are suspicious for small bowel obstruction.  There is a paucity of gas in the colon.  No free intra-abdominal air.  No radiopaque stones.  Postoperative changes in the right upper quadrant and lumbar spine.  Degenerative changes in the hips.  IMPRESSION: Chronic bronchitic changes in the lungs.  No evidence of active pulmonary disease.  Development of mild gas distended small bowel in the mid abdomen with air-fluid levels suggesting obstruction.   Original Report Authenticated By: Burman Nieves, M.D.      1. Small bowel obstruction   2. Hyperglycemia       MDM  Abdominal pain with vomiting. Old records are reviewed and he has multiple ED visits and some hospitalizations for virtually identical complaints. He has had 12 CT scans done in the last 12 months none of which showed significant pathology. He he is been labeled as chronic abdominal pain with narcotic dependence. He'll be given IV hydration and IV narcotics and antiemetics. However, without evidence of an acute surgical abdomen, CT scan will be obtained. Plain films will be obtained to screen for obstruction and/or ileus.  Abdominal x-rays do show a dilated small bowel loop with air fluid levels suspicious for early obstruction. He did require several injections of hydromorphone and is requesting more. Case is discussed with Dr. Gwenlyn Perking of [facility] who agrees to admit the patient.     Dione Booze, MD 06/01/12 [phone]

## 2012-06-01 NOTE — H&P (Signed)
[facility] History and Physical  [name]:[id number] DOB: November 29, 1950 DOA: 06/01/2012  Referring physician: Dr. Preston Fleeting PCP: Warrick Parisian, MD  Specialists: Dr. Marina Goodell ([facility])  Chief Complaint: Intractable nausea vomiting, abdominal pain  HPI: Ryan [name] is a 62 y.o. male with a past medical history significant for hypertension, hyperlipidemia, diabetes, anxiety/depression and chronic back pain (with chronic use of narcotics); came to the hospital secondary to intractable nausea/vomiting and abdominal pain. Patient reports that this to ongoing problem for the last 6 months or so, and has required multiple admissions into the hospital; last one was in January, 2014. At this time patient reports that his symptoms worsened over the last 2-3 days prior to admission and he has not been able to keep anything down, including his medications. In the ED he was found mildly dehydrated, with a mild hypokalemia, and with x-ray of his abdomen demonstrating early signs for SBO. [facility] has been called to admit the patient for further evaluation and treatment. Patient denies chest pain, shortness of breath, hematemesis, melena, hematochezia, headaches, blurred vision, cough, fever, chills or any other acute complaints. He reports that his last bowel movement was the day prior to admission, loose but without being watery diarrhea (most likely due to the use of lactulose).   Review of Systems:  Negative except as otherwise mentioned on history of present illness.  Past Medical History  Diagnosis Date  . Hypertension   . DVT (deep venous thrombosis) ~ 2011    BLE  . 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 Hyperlipidemia   . CAD (coronary atherosclerotic disease)     s/p stents x2  . Small bowel obstruction   . Pancreatitis   . Anxiety   . GERD (gastroesophageal reflux disease)   . CHF (congestive heart failure)   . Myocardial infarction ~ 2009  .  Anginal pain   . Pneumonia ~ 2012  . Shortness of breath     "lying down & w/exertion" (03/04/2012)  . Type II diabetes mellitus   . Hep B w/o coma 1970's    "I was in [place]" (03/04/2012)  . Chronic lower back pain   . PTSD (post-traumatic stress disorder)    Past Surgical History  Procedure Date  . Esophagogastroduodenoscopy 09/22/2011    Procedure: ESOPHAGOGASTRODUODENOSCOPY (EGD);  Surgeon: Charna Elizabeth, MD;  Location: [facility] ENDOSCOPY;  Service: Endoscopy;  Laterality: N/A;  . Esophagogastroduodenoscopy 01/05/2012    Procedure: ESOPHAGOGASTRODUODENOSCOPY (EGD);  Surgeon: Rachael Fee, MD;  Location: [facility] ENDOSCOPY;  Service: Endoscopy;  Laterality: N/A;  . Cholecystectomy ~ 2009  . Spinal growth rods ~ 2011  . Tonsillectomy and adenoidectomy   . Coronary angioplasty with stent placement ~ 2009`    "1 + ! (after 1st one failed)" (03/04/2012)   Social History:  reports that he has been smoking Cigarettes.  He has a 19 pack-year smoking history. He has never used smokeless tobacco. He reports that he drinks alcohol. He reports that he uses illicit drugs (Marijuana). lives at home with his wife and reports no need for assistance with activities of daily living.   Allergies  Allergen Reactions  . Lisinopril Swelling and Rash  . Morphine And Related Swelling and Rash    Family History  Problem Relation Age of Onset  . Diabetes Father   . Colon cancer Neg Hx     Prior to Admission medications   Medication Sig Start Date End Date Taking? Authorizing Provider  ALPRAZolam (XANAX) 1 MG tablet Take 1 tablet (1 mg total) by mouth 2 (two) times daily. 05/25/12  Yes Shari A Upstill, PA-C  aspirin EC 81 MG tablet Take 81 mg by mouth daily.   Yes Historical Provider, MD  DULoxetine (CYMBALTA) 30 MG capsule Take 1 capsule (30 mg total) by mouth daily. 11/04/11 11/03/12 Yes Shanker Levora Dredge, MD  gabapentin (NEURONTIN) 400 MG capsule Take 1 capsule (400 mg total) by mouth 3 (three) times daily. 05/25/12   Yes Shari A Upstill, PA-C  glipiZIDE (GLUCOTROL XL) 10 MG 24 hr tablet Take 10 mg by mouth 2 (two) times daily.   Yes Historical Provider, MD  insulin lispro protamine-insulin lispro (HUMALOG 75/25) (75-25) 100 UNIT/ML SUSP Inject 12 Units into the skin 2 (two) times daily with a meal.   Yes Historical Provider, MD  lactulose (CHRONULAC) 10 GM/15ML solution Take 20 g by mouth 2 (two) times daily.   Yes Historical Provider, MD  losartan (COZAAR) 100 MG tablet Take 100 mg by mouth daily.   Yes Historical Provider, MD  metFORMIN (GLUCOPHAGE) 850 MG tablet Take 850 mg by mouth 3 (three) times daily.   Yes Historical Provider, MD  metoCLOPramide (REGLAN) 10 MG tablet Take 10 mg by mouth every 6 (six) hours as needed. For nausea 02/05/12  Yes Flint Melter, MD  ondansetron (ZOFRAN) 4 MG tablet Take 4 mg by mouth every 6 (six) hours as needed. For nausea 01/23/12  Yes Tiffany Irine Seal, PA  oxycodone (ROXICODONE) 30 MG immediate release tablet Take 1 tablet (30 mg total) by mouth every 4 (four) hours as needed. For breakthrough pain 05/25/12  Yes Shari A Upstill, PA-C  OxyCODONE HCl ER 60 MG T12A Take 1 tablet by mouth every 12 (twelve) hours. 05/07/12  Yes Marinda Elk, MD  pantoprazole (PROTONIX) 40 MG tablet Take 40 mg by mouth daily.   Yes Historical Provider, MD  Ranitidine HCl (ZANTAC PO) Take 300 mg by mouth 2 (two) times daily.    Yes Historical Provider, MD  sennosides-docusate sodium (SENOKOT-S) 8.6-50 MG tablet Take 2 tablets by mouth daily. For constipation 03/06/12  Yes Shanker Levora Dredge, MD   Physical Exam: Filed Vitals:   06/01/12 0521  BP: 147/87  Pulse: 97  Temp: 99.1 F (37.3 C)  TempSrc: Oral  Resp: 14  SpO2: 93%     General:  Dry mucous membranes, afebrile, but the examination, mild distress secondary to abdominal pain.  Eyes: No icterus, no nystagmus, PERRLA, extraocular muscles intact.  ENT: Dry mucous membranes, positive oral candidiasis, poor dentition, no drainage  out of his ears and nostrils  Neck: Supple, no thyromegaly, no bruits.  Cardiovascular: Mild tachycardia, no murmurs, no gallops, no rubs  Respiratory: Clear to auscultation, no wheezing, no crackles, no rhonchi  Abdomen: Diffuse tenderness on palpation affecting mainly mid epigastric area and RUQ region; positive BS, no guarding  Skin: No rash, no petechiae. There is dry skin of his lower extremities his appreciated on exam.  Musculoskeletal: Full range of motion, no joint swelling or erythema. Pain to his lower back with palpation (per patient chronic and with radiation down his legs)  Psychiatric: A stable mood, no suicidal ideation or depression.  Neurologic: Alert, awake and oriented x3, cranial nerves grossly intact, muscle strength 4-5 bilaterally symmetrically secondary to poor effort; that is mild decrease sensation to light touch in a stocking distribution of his lower extremities (most likely due to diabetic neuropathy). Gait was not assessed; no other  focal motor or sensory abnormalities appreciated on exam.  Labs on Admission:  Basic Metabolic Panel:  Lab 06/01/12 1610  NA 140  K 3.4*  CL 99  CO2 29  GLUCOSE 316*  BUN 9  CREATININE 0.68  CALCIUM 10.3  MG --  PHOS --   Liver Function Tests:  Lab 06/01/12 0555  AST 9  ALT 5  ALKPHOS 86  BILITOT 0.3  PROT 8.1  ALBUMIN 4.0    Lab 06/01/12 0555  LIPASE 23  AMYLASE --   CBC:  Lab 06/01/12 0555  WBC 7.8  NEUTROABS 5.2  HGB 17.2*  HCT 50.6  MCV 94.6  PLT 247   BNP (last 3 results)  Basename 04/27/12 0951  PROBNP 78.2   Radiological Exams on Admission: Dg Abd Acute W/chest  06/01/2012  *RADIOLOGY REPORT*  Clinical Data: The abdominal pain and vomiting.  ACUTE ABDOMEN SERIES (ABDOMEN 2 VIEW & CHEST 1 VIEW)  Comparison: 05/25/2012  Findings: Normal inspiration.  Normal heart size and pulmonary vascularity.  Peribronchial thickening and interstitial changes most suggestive of chronic bronchitis.  Old  left rib fracture.  No focal consolidation or airspace disease.  No blunting of costophrenic angles.  No pneumothorax.  No significant change since previous chest.  Prominent loop of gas distended small bowel with mild fold thickening in the right abdomen with suggestion of additional distended loops centrally.  A few air-fluid levels are noted on the upright view.  Findings are suspicious for small bowel obstruction. There is a paucity of gas in the colon.  No free intra-abdominal air.  No radiopaque stones.  Postoperative changes in the right upper quadrant and lumbar spine.  Degenerative changes in the hips.  IMPRESSION: Chronic bronchitic changes in the lungs.  No evidence of active pulmonary disease.  Development of mild gas distended small bowel in the mid abdomen with air-fluid levels suggesting obstruction.   Original Report Authenticated By: Burman Nieves, M.D.     Assessment/Plan 1-Abdominal pain: With associated nausea vomiting and inability to keep things down. Her main concern will be gastroparesis worsened by continue use of narcotics; but obviously other abnormalities of his GI tract could be responsible for his symptoms like IBS. -Patient will be admitted to regular bed on clear liquids for bowel rest. -Antiemetics and when necessary analgesics. -GI has been consulted seems in the past he had some abnormalities on endoscopy in order to assess and help with ongoing condition. Will follow recommendations. -Pain medications will be adjusted as much as possible, unfortunately patient have chronic back pain narcotic dependent at this moment. -Will continue Reglan q. a.c. and at bedtime. -IV fluid resuscitation and supportive care. -If his symptoms worsen he will require to have an NG tube placed and will be completely n.p.o. Will follow clinical evolution.  2-DIABETES MELLITUS, TYPE II: Glipizide and metformin on hold. Sliding scale and Lantus will be provided for glucose control. Will  check A1c.  3-HYPERTENSION: Fairly well controlled. Continue home treatment.  4-GERD: Continue PPI.  5-Hypokalemia: Secondary to vomiting and volume contraction. [place] at this moment. Will replete and check this level.  6-early changes for SBO (small bowel obstruction): X-ray of his abdomen in the ED is demonstrating early changes for small bowel obstruction; on my examination patient's reports no further vomiting but is still feeling associated. He also endorses to be hungry. At this moment in order to provide some bowel rest will start patient on clear liquids only, when necessary antiemetics, when necessary analgesics; follow clinical evolution.  Patient reports that he is passing gas and that his last bowel movement was the day prior to admission. HEENT is that his symptoms worsened patient will be transition to n.p.o. status and an NG tube will be placed. Patient in agreement with plan.  7-Nausea and vomiting: Chronic and ongoing. GI has been consulted for assistance and help; especially deciding if any further studies are needed (like endoscopy). Treat symptomatically meanwhile.  8-Candida infection, oral: Will treat with nystatin.  9-Anxiety and depression: Continue PRN Xanax and daily Cymbalta  10-chronic back pain: Will continue Neurontin. Patient will receive as needed pain medication for his back and also abdominal pain.  11-tobacco abuse: Smoking cessation has been provided. Patient has refused nicotine patch. His planning to stop smoking at this point.  GI [place] consulted  Code Status: Full Family Communication: no family at bedside Disposition Plan: admit as inpatient due to intractable nausea, vomiting and abdominal pain; with x-ray in ED showing early obstruction. LOS > 2 midnights.   Time spent: >30 minutes  Heber Hoog [facility] Pager [phone]  If 7PM-7AM, please contact night-coverage [email or web address] Password [facility] 06/01/2012, 8:18 AM

## 2012-06-02 DIAGNOSIS — R197 Diarrhea, unspecified: Secondary | ICD-10-CM

## 2012-06-02 LAB — URINE CULTURE

## 2012-06-02 LAB — CBC
HCT: 43.4 % (ref 39.0–52.0)
Hemoglobin: 14.4 g/dL (ref 13.0–17.0)
MCHC: 33.2 g/dL (ref 30.0–36.0)
MCV: 95.2 fL (ref 78.0–100.0)
RDW: 13.4 % (ref 11.5–15.5)

## 2012-06-02 LAB — BASIC METABOLIC PANEL
BUN: 7 mg/dL (ref 6–23)
CO2: 26 mEq/L (ref 19–32)
Chloride: 97 mEq/L (ref 96–112)
Creatinine, Ser: 0.74 mg/dL (ref 0.50–1.35)
GFR calc Af Amer: >90 mL/min (ref >90)
Potassium: 4.4 mEq/L (ref 3.5–5.1)

## 2012-06-02 LAB — GLUCOSE, CAPILLARY
Glucose-Capillary: 224 mg/dL — ABNORMAL HIGH (ref 70–99)
Glucose-Capillary: 216 mg/dL — ABNORMAL HIGH (ref 70–99)

## 2012-06-02 MED ORDER — SODIUM CHLORIDE 0.9 % IV SOLN
INTRAVENOUS | Status: DC
  Administered 2012-06-02 – 2012-06-04 (×4): via INTRAVENOUS

## 2012-06-02 NOTE — Progress Notes (Signed)
     [facility] Daily Rounding Note 06/02/2012, 9:26 AM  SUBJECTIVE:       Bad night.  Hardly slept.  Abdominal pain and nausea persist, vomited this AM.  Unable to progress beyond clears  OBJECTIVE:         Vital signs in last 24 hours:    Temp:  [97.7 F (36.5 C)-98.7 F (37.1 C)] 97.8 F (36.6 C) (02/04 0510) Pulse Rate:  [69-88] 69  (02/04 0510) Resp:  [16-20] 18  (02/04 0510) BP: (122-154)/(68-92) 128/69 mmHg (02/04 0510) SpO2:  [93 %-100 %] 94 % (02/04 0510) Weight:  [141.613 kg (312 lb 3.2 oz)] 141.613 kg (312 lb 3.2 oz) (02/03 1058) Last BM Date: 06/01/12 General: obese, not acutely ill looking   Heart: RRR Chest: clear B.  Breathing unlabored Abdomen: obese, minimal if any tenderness  Extremities: no pitting pedal edema,  Neuro/Psych:  Pleasant, depressed, no tremor, no confusion  Intake/Output from previous day: 02/03 0701 - 02/04 0700 In: 3481 [P.O.:1580; I.V.:1901] Out: 1000 [Urine:1000]  Intake/Output this shift:    Lab Results:  Basename 06/02/12 0600 06/01/12 0555  WBC 6.6 7.8  HGB 14.4 17.2*  HCT 43.4 50.6  PLT 194 247   BMET  Basename 06/02/12 0600 06/01/12 0555  NA 132* 140  K 4.4 3.4*  CL 97 99  CO2 26 29  GLUCOSE 262* 316*  BUN 7 9  CREATININE 0.74 0.680.66  CALCIUM 8.8 10.3   Hgb A1c   12.7 on 06/01/12  ASSESMENT: * Acute on chronic abd pain, nausea and vomiting. Extensive radiologic, endoscopic work up since 08/2011, and at other hospitals previous to that. May currently have bout of gastroenteritis. Despite normal GES in 09/2011, he certainly has a component of gatroparesis to his sxs. Unfortunately the narcotics he uses to control pain only exacerbate the nausea/vomiting. Ileus vs PSBO pattern on recent films. No current evidence of acute pancreatitis  * IDDM, poorly controlled. Mean plasma glucose of 318 by A1c. Has no SSI coverage at home.  Insulin dosing needs adjusting and close monitoring. Wonder if he should be seen by an  endocrinologist? *  Hyponatremia.   PLAN: *  No plans for repeat endoscopy at present.  *  Note the first s/e for Cymbalta listed in [facility] is nausea, also listed is abd pain.  Wonder if this med is necessary or could it be discontinued or replaced with something else.  *  Needs improved control of DM   LOS: 1 day   Jennye Moccasin  06/02/2012, 9:26 AM Pager: [phone]     I have taken an interval history, reviewed the chart and examined the patient. I agree with the Advanced Practitioner's note, impression and recommendations. Continue clears as tolerated and current medications. Consider stopping Cymbalta.  Venita Lick. Russella Dar MD Clementeen Graham

## 2012-06-02 NOTE — Progress Notes (Signed)
[facility] PROGRESS NOTE  KALMAN NYLEN JYN:[id number] DOB: 1951/01/30 DOA: 06/01/2012 PCP: Warrick Parisian, MD  Assessment/Plan: Principal Problem:  *Abdominal pain Active Problems:  DIABETES MELLITUS, TYPE II  HYPERTENSION  GERD  Hypokalemia  SBO (small bowel obstruction)  Nausea and vomiting  Candida infection, oral  Anxiety and depression    1. Vomiting/Diarrhea and Abdominal pain: Patient presented with worsening abdominal pain, vomiting and diarrhea, against the background of chronic/recurrent GI issues, extensively evaluated in the recent pats, and necessitating multiple hospitalizations. Although he has had normal gastric emptying study in the past, he is felt to have an element of gastroparesis, probably related to chronic opioid use. Lipase is normal at 23. Patient may have an acute viral gastroenteritis, superimposed on his chronic problems. Managing with bowel rest, iv fluids, antiemetics, Reglan and PPI. Dr Claudette Head provided GI consultation, and we shall manage as recommended. Will check C.difficile PCR for completeness.  2. DM Type II: Glipizide and metformin on hold. On SSI/Lantus. HBA1C is 12.7.  3. HTN: Fairly well controlled on pre-admission anti-hypertensives. .  4. GERD: Continued on  PPI. Per GI, H2RA has been discontinued 5. Hypokalemia: Secondary to vomiting and volume contraction. Repleting as indicated.  6. Query SBO (small bowel obstruction): Abdominal X-Ray revealed mild gas distended small bowel in the mid abdomen with air-fluid levels suggesting obstruction.Patient reports that he is passing gas and that his last bowel movement was the day prior to admission. Clinically, this is more consistent with ileus. 7. Candida infection, oral: An incidental finding on physical examination. Managing with oral Nystatin.  8. Anxiety and depression: Stable. Continue PRN Xanax and daily Cymbalta  9. Chronic back pain: Will continue Neurontin and prn  analgesics. Not problematic.  10. Tobacco abuse: Smoking cessation has been provided. Patient has refused Nicotine patch. Appears inclined to quit.      Code Status: Full Code.  Family Communication:  Disposition Plan: To be determined.    Brief narrative: 62 y.o. male with a past medical history significant for hypertension, hyperlipidemia, diabetes, anxiety/depression and chronic back pain (with chronic use of narcotics), presenting with intractable nausea/vomiting and abdominal pain. Patient reports that this to ongoing problem for the last 6 months or so, and has required multiple admissions into the hospital; last one was in January, 2014. Symptoms worsened over the 2-3 days prior to admission and he has not been able to keep anything down, including his medications. In the ED he was found mildly dehydrated, with a mild hypokalemia, and with x-ray of his abdomen demonstrating early signs for SBO. Last bowel movement was on 05/31/12. Admitted for further evaluation and treatment.   Consultants:  Dr Claudette Head, GI.   Procedures:  CXR/Abdominal X-Ray.   Antibiotics:  N/A.   HPI/Subjective: Vomited once this AM, and had 2 liquid stools.   Objective: Vital signs in last 24 hours: Temp:  [97.7 F (36.5 C)-98.7 F (37.1 C)] 97.8 F (36.6 C) (02/04 0510) Pulse Rate:  [69-88] 69  (02/04 0510) Resp:  [11-20] 18  (02/04 0510) BP: (122-154)/(68-92) 128/69 mmHg (02/04 0510) SpO2:  [93 %-100 %] 94 % (02/04 0510) Weight:  [141.613 kg (312 lb 3.2 oz)] 141.613 kg (312 lb 3.2 oz) (02/03 1058) Weight change:  Last BM Date: 06/01/12  Intake/Output from previous day: 02/03 0701 - 02/04 0700 In: 3481 [P.O.:1580; I.V.:1901] Out: 1000 [Urine:1000]     Physical Exam: General: Comfortable, alert, communicative, fully oriented, not short of breath at rest.  HEENT:  No clinical  pallor, no jaundice, no conjunctival injection or discharge. NECK:  Supple, JVP not seen, no carotid bruits,  no palpable lymphadenopathy, no palpable goiter. CHEST:  Clinically clear to auscultation, no wheezes, no crackles. Hydration appears fair.  HEART:  Sounds 1 and 2 heard, normal, regular, no murmurs. ABDOMEN:  Obses, soft, mildly tender in upper abdomen, no palpable organomegaly, no palpable masses, normal bowel sounds. GENITALIA:  Not examined. LOWER EXTREMITIES:  No pitting edema, palpable peripheral pulses. MUSCULOSKELETAL SYSTEM:  Unremarkable. CENTRAL NERVOUS SYSTEM:  No focal neurologic deficit on gross examination.  Lab Results:  Basename 06/02/12 0600 06/01/12 0555  WBC 6.6 7.8  HGB 14.4 17.2*  HCT 43.4 50.6  PLT 194 247    Basename 06/02/12 0600 06/01/12 0555  NA 132* 140  K 4.4 3.4*  CL 97 99  CO2 26 29  GLUCOSE 262* 316*  BUN 7 9  CREATININE 0.74 0.680.66  CALCIUM 8.8 10.3   Recent Results (from the past 240 hour(s))  URINE CULTURE     Status: Normal   Collection Time   05/25/12 10:51 AM      Component Value Range Status Comment   Specimen Description URINE, CLEAN CATCH   Final    Special Requests NONE   Final    Culture  Setup Time 05/25/2012 21:08   Final    Colony Count >=100,000 COLONIES/ML   Final    Culture ESCHERICHIA COLI   Final    Report Status 05/27/2012 FINAL   Final    Organism ID, Bacteria ESCHERICHIA COLI   Final   URINE CULTURE     Status: Normal   Collection Time   06/01/12  7:34 AM      Component Value Range Status Comment   Specimen Description URINE, RANDOM   Final    Special Requests NONE   Final    Culture  Setup Time 06/01/2012 09:00   Final    Colony Count 20,OOO COLONIES/ML   Final    Culture     Final    Value: Multiple bacterial morphotypes present, none predominant. Suggest appropriate recollection if clinically indicated.   Report Status 06/02/2012 FINAL   Final      Studies/Results: Dg Abd Acute W/chest  06/01/2012  *RADIOLOGY REPORT*  Clinical Data: The abdominal pain and vomiting.  ACUTE ABDOMEN SERIES (ABDOMEN 2 VIEW &  CHEST 1 VIEW)  Comparison: 05/25/2012  Findings: Normal inspiration.  Normal heart size and pulmonary vascularity.  Peribronchial thickening and interstitial changes most suggestive of chronic bronchitis.  Old left rib fracture.  No focal consolidation or airspace disease.  No blunting of costophrenic angles.  No pneumothorax.  No significant change since previous chest.  Prominent loop of gas distended small bowel with mild fold thickening in the right abdomen with suggestion of additional distended loops centrally.  A few air-fluid levels are noted on the upright view.  Findings are suspicious for small bowel obstruction. There is a paucity of gas in the colon.  No free intra-abdominal air.  No radiopaque stones.  Postoperative changes in the right upper quadrant and lumbar spine.  Degenerative changes in the hips.  IMPRESSION: Chronic bronchitic changes in the lungs.  No evidence of active pulmonary disease.  Development of mild gas distended small bowel in the mid abdomen with air-fluid levels suggesting obstruction.   Original Report Authenticated By: Burman Nieves, M.D.     Medications: Scheduled Meds:   . aspirin EC  81 mg Oral Daily  . DULoxetine  30  mg Oral Daily  . gabapentin  400 mg Oral TID  . heparin  5,000 Units Subcutaneous Q8H  . insulin aspart  0-15 Units Subcutaneous TID WC  . insulin glargine  8 Units Subcutaneous QHS  . lactulose  20 g Oral BID  . losartan  100 mg Oral Daily  . metoCLOPramide  10 mg Oral TID AC & HS  . nystatin  5 mL Mouth/Throat QID  . pantoprazole  40 mg Oral BID  . potassium chloride  20 mEq Oral Daily   Continuous Infusions:  PRN Meds:.acetaminophen, acetaminophen, ALPRAZolam, HYDROmorphone (DILAUDID) injection, ondansetron (ZOFRAN) IV, ondansetron    LOS: 1 day   Tola Meas,CHRISTOPHER  [facility] Pager [phone]. If 8PM-8AM, please contact night-coverage at [email or web address], password [facility] 06/02/2012, 7:58 AM  LOS: 1 day

## 2012-06-02 NOTE — Progress Notes (Signed)
Nutrition Brief Note  Patient identified on the Malnutrition Screening Tool (MST) Report. Per chart, pt denies poor appetite PTA, however chart states he was unable to keep anything down for 2-3 days. Pt currently on Clear Liquid diet and consuming 100% of meal trays. Weight in 12/2011 was 315 lb. It is 312 lb currently.   Body mass index is 37.02 kg/(m^2). Patient meets criteria for Obese Class II based on current BMI.   Current diet order is Clear Liquid, patient is consuming approximately 100% of meals at this time. Labs and medications reviewed.   No nutrition interventions warranted at this time. If nutrition issues arise, please consult RD.   Ryan Motto MS, RD, LDN Pager: [phone] After-hours pager: [phone]

## 2012-06-03 DIAGNOSIS — E669 Obesity, unspecified: Secondary | ICD-10-CM

## 2012-06-03 LAB — CBC
MCH: 31.3 pg (ref 26.0–34.0)
MCV: 95 fL (ref 78.0–100.0)
Platelets: 199 10*3/uL (ref 150–400)
RDW: 13.1 % (ref 11.5–15.5)
WBC: 5.3 10*3/uL (ref 4.0–10.5)

## 2012-06-03 LAB — BASIC METABOLIC PANEL
Calcium: 9.3 mg/dL (ref 8.4–10.5)
Creatinine, Ser: 0.7 mg/dL (ref 0.50–1.35)
GFR calc Af Amer: >90 mL/min (ref >90)
Sodium: 136 mEq/L (ref 135–145)

## 2012-06-03 LAB — GLUCOSE, CAPILLARY: Glucose-Capillary: 172 mg/dL — ABNORMAL HIGH (ref 70–99)

## 2012-06-03 LAB — CLOSTRIDIUM DIFFICILE BY PCR: Toxigenic C. Difficile by PCR: NEGATIVE

## 2012-06-03 MED ORDER — DULOXETINE HCL 20 MG PO CPEP: 20.0000 mg | ORAL_CAPSULE | ORAL | Status: DC

## 2012-06-03 MED ORDER — DULOXETINE HCL 20 MG PO CPEP
20.0000 mg | ORAL_CAPSULE | Freq: Every day | ORAL | Status: DC
  Administered 2012-06-03 – 2012-06-08 (×6): 20 mg via ORAL
  Filled 2012-06-03 (×8): qty 1

## 2012-06-03 NOTE — Progress Notes (Signed)
[facility] PROGRESS NOTE  Ryan [name] ZOX:[id number] DOB: 12-Aug-1950 DOA: 06/01/2012 PCP: Warrick Parisian, MD  Brief narrative: Ryan [name] is a 62 year old man with a past medical history of hypertension, hyperlipidemia, diabetes, chronic back pain with chronic narcotic use, depression and anxiety who was admitted to the hospital on 06/01/2012 with intractable nausea, vomiting and abdominal pain. He has been seen by gastroenterology, who reports that he has had an extensive radiologic and endoscopic evaluation beginning May 2013 and at previous hospitals. He has had a normal gastric emptying study 2003 although it is felt that his symptoms could have a component of gastroparesis.  Assessment/Plan: Principal Problem:  *Abdominal pain with nausea and vomiting, rule out small bowel obstruction -No further radiographic or endoscopic evaluation felt to be necessary given normal studies in the past. Normal gastric emptying study done in 2003. -Likely has gastroparesis related to chronic opiate use and slowed transit time. Continue with Reglan. -Oral opiates on hold. Still requiring every 4 hour pain relief with IV Dilaudid-HP. -Given ongoing symptoms and concerns for ileus on radiography, would not advance diet to solids. We'll downgrade back to full liquid  diet. -Repeat abdominal films in the morning. Active Problems:  Chronic back pain -Continue Neurontin and analgesics. Tobacco abuse -Tobacco cessation counseling provided. Refuses nicotine patch.  DIABETES MELLITUS, TYPE II, uncontrolled -Oral hypoglycemics on hold. Continue Lantus/SSI. -CBGs 127-240. Hemoglobin A1c markedly elevated at 12.9. Suspect underlying noncompliance with medications. -Diabetes coordinator consultation.  HYPERTENSION -Continue Cozaar.  GERD -Continue PPI therapy.  Hypokalemia -Replaced.  Candida infection, oral -Continue oral nystatin.  Anxiety and depression -Continue Xanax. -Cymbalta  increased. Class II obesity - Status post dietitian evaluation 06/02/2012.  Code Status: Full. Family Communication:  Disposition Plan: Home when stable.   Medical Consultants:  Dr. Claudette Head, gastroenterology.  Other Consultants:  Dietitian  Anti-infectives:  None.  HPI/Subjective: Patient reports a yellow bilious vomiting last night but is anxious to try solid foods today. Still complaining of severe mid abdominal pain, sharp in quality. Had 2 large soft bowel movements yesterday, no diarrhea.  Objective: Filed Vitals:   06/02/12 0510 06/02/12 1404 06/02/12 2115 06/03/12 0620  BP: 128/69 116/65 137/76 112/56  Pulse: 69 62 62 64  Temp: 97.8 F (36.6 C) 97.5 F (36.4 C) 97.7 F (36.5 C) 97.7 F (36.5 C)  TempSrc:      Resp: 18 20 20 18   Height:      Weight:      SpO2: 94% 95% 93% 96%    Intake/Output Summary (Last 24 hours) at 06/03/12 1039 Last data filed at 06/03/12 0557  Gross per 24 hour  Intake 3306.67 ml  Output   3050 ml  Net 256.67 ml    Exam: Gen:  NAD Cardiovascular:  RRR, No M/R/G Respiratory:  Lungs CTAB Gastrointestinal:  Abdomen soft, NT/ND, + BS Extremities:  No C/E/C  Data Reviewed: Basic Metabolic Panel:  Lab 06/03/12 8119 06/02/12 0600 06/01/12 0944 06/01/12 0555  NA 136 132* -- 140  K 4.5 4.4 -- --  CL 101 97 -- 99  CO2 25 26 -- 29  GLUCOSE 130* 262* -- 316*  BUN 5* 7 -- 9  CREATININE 0.70 0.74 -- 0.680.66  CALCIUM 9.3 8.8 -- 10.3  MG -- -- 1.9 --  PHOS -- -- -- 3.1   GFR Estimated Creatinine Clearance: 151 ml/min (by C-G formula based on Cr of 0.7). Liver Function Tests:  Lab 06/01/12 0944 06/01/12 0555  AST 13 9  ALT 6 5  ALKPHOS 84 86  BILITOT 0.4 0.3  PROT 8.0 8.1  ALBUMIN 4.0 4.0    Lab 06/01/12 0555  LIPASE 23  AMYLASE --   CBC:  Lab 06/03/12 0625 06/02/12 0600 06/01/12 0555  WBC 5.3 6.6 7.8  NEUTROABS -- -- 5.2  HGB 15.0 14.4 17.2*  HCT 45.6 43.4 50.6  MCV 95.0 95.2 94.6  PLT 199 194 247    CBG:  Lab 06/03/12 0731 06/02/12 2149 06/02/12 1707 06/02/12 1204 06/02/12 0759  GLUCAP 127* 242* 216* 224* 240*   Hgb A1c  Basename 06/01/12 0944  HGBA1C 12.7*   Microbiology Recent Results (from the past 240 hour(s))  URINE CULTURE     Status: Normal   Collection Time   05/25/12 10:51 AM      Component Value Range Status Comment   Specimen Description URINE, CLEAN CATCH   Final    Special Requests NONE   Final    Culture  Setup Time 05/25/2012 21:08   Final    Colony Count >=100,000 COLONIES/ML   Final    Culture ESCHERICHIA COLI   Final    Report Status 05/27/2012 FINAL   Final    Organism ID, Bacteria ESCHERICHIA COLI   Final   URINE CULTURE     Status: Normal   Collection Time   06/01/12  7:34 AM      Component Value Range Status Comment   Specimen Description URINE, RANDOM   Final    Special Requests NONE   Final    Culture  Setup Time 06/01/2012 09:00   Final    Colony Count 20,OOO COLONIES/ML   Final    Culture     Final    Value: Multiple bacterial morphotypes present, none predominant. Suggest appropriate recollection if clinically indicated.   Report Status 06/02/2012 FINAL   Final   CLOSTRIDIUM DIFFICILE BY PCR     Status: Normal   Collection Time   06/02/12  8:56 PM      Component Value Range Status Comment   C difficile by pcr NEGATIVE  NEGATIVE Final      Procedures and Diagnostic Studies: Dg Abd Acute W/chest  06/01/2012  *RADIOLOGY REPORT*  Clinical Data: The abdominal pain and vomiting.  ACUTE ABDOMEN SERIES (ABDOMEN 2 VIEW & CHEST 1 VIEW)  Comparison: 05/25/2012  Findings: Normal inspiration.  Normal heart size and pulmonary vascularity.  Peribronchial thickening and interstitial changes most suggestive of chronic bronchitis.  Old left rib fracture.  No focal consolidation or airspace disease.  No blunting of costophrenic angles.  No pneumothorax.  No significant change since previous chest.  Prominent loop of gas distended small bowel with mild fold  thickening in the right abdomen with suggestion of additional distended loops centrally.  A few air-fluid levels are noted on the upright view.  Findings are suspicious for small bowel obstruction. There is a paucity of gas in the colon.  No free intra-abdominal air.  No radiopaque stones.  Postoperative changes in the right upper quadrant and lumbar spine.  Degenerative changes in the hips.  IMPRESSION: Chronic bronchitic changes in the lungs.  No evidence of active pulmonary disease.  Development of mild gas distended small bowel in the mid abdomen with air-fluid levels suggesting obstruction.   Original Report Authenticated By: Burman Nieves, M.D.      Scheduled Meds:   . aspirin EC  81 mg Oral Daily  . DULoxetine  20 mg Oral Daily  . gabapentin  400 mg Oral TID  .  heparin  5,000 Units Subcutaneous Q8H  . insulin aspart  0-15 Units Subcutaneous TID WC  . insulin glargine  8 Units Subcutaneous QHS  . lactulose  20 g Oral BID  . losartan  100 mg Oral Daily  . metoCLOPramide  10 mg Oral TID AC & HS  . nystatin  5 mL Mouth/Throat QID  . pantoprazole  40 mg Oral BID  . potassium chloride  20 mEq Oral Daily   Continuous Infusions:   . sodium chloride 100 mL/hr at 06/03/12 0555    Time spent: 25 minutes.   LOS: 2 days   RAMA,CHRISTINA  [facility] Pager [phone].  If 8PM-8AM, please contact night-coverage at [email or web address], password [facility] 06/03/2012, 10:39 AM

## 2012-06-03 NOTE — Progress Notes (Addendum)
Inpatient Diabetes Program Recommendations  AACE/ADA: New Consensus Statement on Inpatient Glycemic Control (2013)  Target Ranges:  Prepandial:   less than 140 mg/dL      Peak postprandial:   less than 180 mg/dL (1-2 hours)      Critically ill patients:  140 - 180 mg/dL   Reason for Visit: Consult for diabetes out of control Talked with patient at length as to why his HgbA1C is so high.  He states that his biggest problem has to do with not being able to keep any food down. He states his pills do not stay down and he will typically vomit in the morning. If he feels he cannot keep food down, he will not take his insulin.  He states he truly needs a referral to rehab as he feels he is so debilitated and weak and does not feel he can take care of himself. He did eat his liquid lunch and has been able to keep it down for 2 hrs thus far.  Cannot make any recommendations for his glucose control as he is unable to eat properly, exercise and cannot keep his meds digested. Please consider a rehab consult.  Pt also stated that he does well with Protonix rather than Reglan.  Note: Thank you, Lenor Coffin, RN, CNS, Diabetes Coordinator [phone])

## 2012-06-03 NOTE — Progress Notes (Addendum)
     [facility] Daily Rounding Note 06/03/2012, 10:04 AM  SUBJECTIVE:       Yellow bilious emesis last night. However he wants to try solid diet.  Says the mid abdominal pain is still severe. C diff testing negative, though pt not having diarrhea and is on lactulose and Reglan. 2 soft large BMs yesterday.   OBJECTIVE:         Vital signs in last 24 hours:    Temp:  [97.5 F (36.4 C)-97.7 F (36.5 C)] 97.7 F (36.5 C) (02/05 0620) Pulse Rate:  [62-64] 64  (02/05 0620) Resp:  [18-20] 18  (02/05 0620) BP: (112-137)/(56-76) 112/56 mmHg (02/05 0620) SpO2:  [93 %-96 %] 96 % (02/05 0620) Last BM Date: 06/03/12 General: looks entirely comfortable   Heart: RRR Chest: clear.  Breathing quietly Abdomen: soft, NT, ND.  Active BS.  obese Extremities: no pitting edema Neuro/Psych:  Pleasant, not drowsy.   Intake/Output from previous day: 02/04 0701 - 02/05 0700 In: 3666.7 [P.O.:1680; I.V.:1986.7] Out: 3450 [Urine:3450]  Intake/Output this shift:    Lab Results:  Basename 06/03/12 0625 06/02/12 0600 06/01/12 0555  WBC 5.3 6.6 7.8  HGB 15.0 14.4 17.2*  HCT 45.6 43.4 50.6  PLT 199 194 247   BMET  Basename 06/03/12 0625 06/02/12 0600 06/01/12 0555  NA 136 132* 140  K 4.5 4.4 3.4*  CL 101 97 99  CO2 25 26 29   GLUCOSE 130* 262* 316*  BUN 5* 7 9  CREATININE 0.70 0.74 0.680.66  CALCIUM 9.3 8.8 10.3   LFT  Basename 06/01/12 0944 06/01/12 0555  PROT 8.0 8.1  ALBUMIN 4.0 4.0  AST 13 9  ALT 6 5  ALKPHOS 84 86  BILITOT 0.4 0.3  BILIDIR <0.1 --  IBILI NOT CALCULATED --    ASSESMENT: * Acute on chronic abd pain, nausea and vomiting. Extensive radiologic, endoscopic work up since 08/2011, and at other hospitals previous to that. May currently have bout of gastroenteritis. Despite normal GES in 09/2011, still suspect gatroparesis . Unfortunately his daily narcotics exacerbate the nausea/vomiting. Ileus vs PSBO pattern on recent films. No current evidence of acute  pancreatitis. Cymbalta known to cause nausea.  Pt willing to stop this, does not recall that he was ever significantly depressed or suicidal. He does however have PTSD with hallucinations in past.    * IDDM, poorly controlled. Mean plasma glucose of 318 by A1c.  The blood glucose improved this AM. Has no SSI coverage at home. Insulin dosing needs adjusting and close monitoring. Wonder if he should be seein an endocrinologist?  * Hyponatremia. Resolved.   PLAN: *  I went ahead and changed dosing/strength of Cymbalta to 20 mg daily.  If no resurfacing of psych issues, would modify dose to q MWF in about 10 days and if tolerated, d/c the med after 2 weeks at that dose. Sudden discontinuation of this drug has been known to lead to withdrawal syndrome. *  Add carb mod diet, at pt request.    LOS: 2 days   Ryan [name]  06/03/2012, 10:04 AM Pager: [phone]     I have taken an interval history, reviewed the chart and examined the patient. I agree with the Advanced Practitioner's note, impression and recommendations. Cymbalta tapering as above. Optimal long term blood sugar control may positively impact his GI symptoms.    Venita Lick. Russella Dar MD Clementeen Graham

## 2012-06-04 ENCOUNTER — Inpatient Hospital Stay (HOSPITAL_COMMUNITY): Payer: Medicare Other

## 2012-06-04 DIAGNOSIS — R5381 Other malaise: Secondary | ICD-10-CM | POA: Diagnosis present

## 2012-06-04 DIAGNOSIS — F4310 Post-traumatic stress disorder, unspecified: Secondary | ICD-10-CM | POA: Diagnosis present

## 2012-06-04 LAB — GLUCOSE, CAPILLARY
Glucose-Capillary: 121 mg/dL — ABNORMAL HIGH (ref 70–99)
Glucose-Capillary: 181 mg/dL — ABNORMAL HIGH (ref 70–99)
Glucose-Capillary: 204 mg/dL — ABNORMAL HIGH (ref 70–99)
Glucose-Capillary: 181 mg/dL — ABNORMAL HIGH (ref 70–99)

## 2012-06-04 MED ORDER — HYOSCYAMINE SULFATE 0.125 MG SL SUBL
0.1250 mg | SUBLINGUAL_TABLET | SUBLINGUAL | Status: DC | PRN
  Filled 2012-06-04: qty 1

## 2012-06-04 MED ORDER — OXYCODONE HCL ER 15 MG PO T12A: 15.0000 mg | EXTENDED_RELEASE_TABLET | Freq: Two times a day (BID) | ORAL | Status: DC

## 2012-06-04 MED ORDER — HYDROMORPHONE HCL PF 1 MG/ML IJ SOLN
1.0000 mg | INTRAMUSCULAR | Status: DC | PRN
  Administered 2012-06-04 – 2012-06-05 (×6): 1 mg via INTRAVENOUS
  Filled 2012-06-04 (×7): qty 1

## 2012-06-04 MED ORDER — OXYCODONE HCL ER 15 MG PO T12A
15.0000 mg | EXTENDED_RELEASE_TABLET | Freq: Two times a day (BID) | ORAL | Status: DC
  Administered 2012-06-04 – 2012-06-05 (×3): 15 mg via ORAL
  Filled 2012-06-04 (×3): qty 1

## 2012-06-04 NOTE — ED Notes (Signed)
Unable to contact via phone letter sent to [facility] address. 

## 2012-06-04 NOTE — Progress Notes (Addendum)
[facility] PROGRESS NOTE  Ryan [name] ZOX:[id number] DOB: 1950-07-24 DOA: 06/01/2012 PCP: Warrick Parisian, MD  Brief narrative: Ryan [name] is a 62 year old man with a past medical history of hypertension, hyperlipidemia, diabetes, chronic back pain with chronic narcotic use, depression and anxiety who was admitted to the hospital on 06/01/2012 with intractable nausea, vomiting and abdominal pain. He has been seen by gastroenterology, who reports that he has had an extensive radiologic and endoscopic evaluation beginning May 2013 and at previous hospitals. He has had a normal gastric emptying study 2003 although it is felt that his symptoms could have a component of gastroparesis.  Assessment/Plan: Principal Problem:  *Abdominal pain with nausea and vomiting, rule out small bowel obstruction -No further radiographic or endoscopic evaluation felt to be necessary given normal studies in the past. Normal gastric emptying study done in 2003. -Likely has gastroparesis related to chronic opiate use and slowed transit time. Continue with Reglan. -Oral opiates on hold. Still requiring every 4 hour pain relief with IV Dilaudid-HP.  Need to start weaning this.  Will decrease to 1 mg Q 4 hours and start OxyContin 15 mg Q 12 hours. -Given ongoing symptoms and concerns for ileus on radiography, his diet was not advanced 06/03/12.  F/U KUB this a.m. Shows resolution of dilated bowel loops.  OK to advance diet to CM.  Educated that it is unlikely that we will be able to completely resolve his symptoms, and that he can minimize N/V by eating very small amounts of food in frequent intervals. Active Problems:  Physical deconditioning -PT consultation requested.  ? SNF for rehab.  Chronic back pain -Continue Neurontin and analgesics. Tobacco abuse -Tobacco cessation counseling provided. Refuses nicotine patch.  DIABETES MELLITUS, TYPE II, uncontrolled -Oral hypoglycemics on hold. Continue Lantus/SSI. -CBGs  [id number]. Hemoglobin A1c markedly elevated at 12.9. -Diabetes coordinator consultation done 06/03/12.  Often vomits up his oral hypoglycemics, then does not take his insulin because he is not eating.  HYPERTENSION -Continue Cozaar.  GERD -Continue PPI therapy.  Hypokalemia -Replaced.  Candida infection, oral -Continue oral nystatin.  Anxiety and depression / PTSD -Continue Xanax. -Continue Cymbalta. Class II obesity - Status post dietitian evaluation 06/02/2012.  Code Status: Full. Family Communication: None at bedside. Disposition Plan: Home versus SNF depending on PT evaluation   Medical Consultants:  Dr. Claudette Head, gastroenterology.  Other Consultants:  Dietitian  PT  Anti-infectives:  None.  HPI/Subjective: Patient reports states his bowels moved yesterday.  Feels nauseated with mild vomiting last p.m.  Didn't sleep well.  No new complaints.  Objective: Filed Vitals:   06/03/12 0620 06/03/12 1400 06/03/12 2100 06/04/12 0537  BP: 112/56 125/55 136/78 131/76  Pulse: 64 65 71 63  Temp: 97.7 F (36.5 C) 97.4 F (36.3 C) 97.9 F (36.6 C) 97.6 F (36.4 C)  TempSrc:  Oral Oral Oral  Resp: 18 20 20 18   Height:      Weight:      SpO2: 96% 95% 99% 93%    Intake/Output Summary (Last 24 hours) at 06/04/12 0919 Last data filed at 06/04/12 0751  Gross per 24 hour  Intake   3479 ml  Output   2900 ml  Net    579 ml    Exam: Gen:  NAD Cardiovascular:  RRR, No M/R/G Respiratory:  Lungs CTAB Gastrointestinal:  Abdomen soft, NT/ND, + BS Extremities:  No C/E/C  Data Reviewed: Basic Metabolic Panel:  Lab 06/03/12 8119 06/02/12 0600 06/01/12 0944 06/01/12 0555  NA 136 132* --  140  K 4.5 4.4 -- --  CL 101 97 -- 99  CO2 25 26 -- 29  GLUCOSE 130* 262* -- 316*  BUN 5* 7 -- 9  CREATININE 0.70 0.74 -- 0.680.66  CALCIUM 9.3 8.8 -- 10.3  MG -- -- 1.9 --  PHOS -- -- -- 3.1   GFR Estimated Creatinine Clearance: 151 ml/min (by C-G formula based on Cr of  0.7). Liver Function Tests:  Lab 06/01/12 0944 06/01/12 0555  AST 13 9  ALT 6 5  ALKPHOS 84 86  BILITOT 0.4 0.3  PROT 8.0 8.1  ALBUMIN 4.0 4.0    Lab 06/01/12 0555  LIPASE 23  AMYLASE --   CBC:  Lab 06/03/12 0625 06/02/12 0600 06/01/12 0555  WBC 5.3 6.6 7.8  NEUTROABS -- -- 5.2  HGB 15.0 14.4 17.2*  HCT 45.6 43.4 50.6  MCV 95.0 95.2 94.6  PLT 199 194 247   CBG:  Lab 06/04/12 0735 06/03/12 2108 06/03/12 1624 06/03/12 1116 06/03/12 0731  GLUCAP 121* 172* 228* 154* 127*   Hgb A1c  Basename 06/01/12 0944  HGBA1C 12.7*   Microbiology Recent Results (from the past 240 hour(s))  URINE CULTURE     Status: Normal   Collection Time   05/25/12 10:51 AM      Component Value Range Status Comment   Specimen Description URINE, CLEAN CATCH   Final    Special Requests NONE   Final    Culture  Setup Time 05/25/2012 21:08   Final    Colony Count >=100,000 COLONIES/ML   Final    Culture ESCHERICHIA COLI   Final    Report Status 05/27/2012 FINAL   Final    Organism ID, Bacteria ESCHERICHIA COLI   Final   URINE CULTURE     Status: Normal   Collection Time   06/01/12  7:34 AM      Component Value Range Status Comment   Specimen Description URINE, RANDOM   Final    Special Requests NONE   Final    Culture  Setup Time 06/01/2012 09:00   Final    Colony Count 20,OOO COLONIES/ML   Final    Culture     Final    Value: Multiple bacterial morphotypes present, none predominant. Suggest appropriate recollection if clinically indicated.   Report Status 06/02/2012 FINAL   Final   CLOSTRIDIUM DIFFICILE BY PCR     Status: Normal   Collection Time   06/02/12  8:56 PM      Component Value Range Status Comment   C difficile by pcr NEGATIVE  NEGATIVE Final      Procedures and Diagnostic Studies: Dg Abd Acute W/chest  06/01/2012  *RADIOLOGY REPORT*  Clinical Data: The abdominal pain and vomiting.  ACUTE ABDOMEN SERIES (ABDOMEN 2 VIEW & CHEST 1 VIEW)  Comparison: 05/25/2012  Findings: Normal  inspiration.  Normal heart size and pulmonary vascularity.  Peribronchial thickening and interstitial changes most suggestive of chronic bronchitis.  Old left rib fracture.  No focal consolidation or airspace disease.  No blunting of costophrenic angles.  No pneumothorax.  No significant change since previous chest.  Prominent loop of gas distended small bowel with mild fold thickening in the right abdomen with suggestion of additional distended loops centrally.  A few air-fluid levels are noted on the upright view.  Findings are suspicious for small bowel obstruction. There is a paucity of gas in the colon.  No free intra-abdominal air.  No radiopaque stones.  Postoperative changes  in the right upper quadrant and lumbar spine.  Degenerative changes in the hips.  IMPRESSION: Chronic bronchitic changes in the lungs.  No evidence of active pulmonary disease.  Development of mild gas distended small bowel in the mid abdomen with air-fluid levels suggesting obstruction.   Original Report Authenticated By: Burman Nieves, M.D.    Dg Abd 1 View  06/04/2012  *RADIOLOGY REPORT*  Clinical Data: Evaluate distended small bowel.  ABDOMEN - 1 VIEW  Comparison: Plain films of 06/01/2012.  Findings: Single supine view of the abdomen and pelvis.  The previously described small bowel distention in the right side of the abdomen has resolved.  Bowel gas pattern is within normal limits.  The peripheral abdomen and low pelvis are excluded. Cholecystectomy clips. Distal gas and stool.  Lower lumbar fixation. No pneumatosis or free intraperitoneal air.  IMPRESSION: Resolved small bowel dilatation, without acute finding.   Original Report Authenticated By: Jeronimo Greaves, M.D.     Scheduled Meds:    . aspirin EC  81 mg Oral Daily  . DULoxetine  20 mg Oral Daily  . gabapentin  400 mg Oral TID  . heparin  5,000 Units Subcutaneous Q8H  . insulin aspart  0-15 Units Subcutaneous TID WC  . insulin glargine  8 Units Subcutaneous QHS   . lactulose  20 g Oral BID  . losartan  100 mg Oral Daily  . metoCLOPramide  10 mg Oral TID AC & HS  . nystatin  5 mL Mouth/Throat QID  . pantoprazole  40 mg Oral BID  . potassium chloride  20 mEq Oral Daily   Continuous Infusions:    . sodium chloride 100 mL/hr at 06/04/12 0556    Time spent: 25 minutes.   LOS: 3 days   RAMA,CHRISTINA  [facility] Pager [phone].  If 8PM-8AM, please contact night-coverage at [email or web address], password [facility] 06/04/2012, 9:19 AM

## 2012-06-04 NOTE — Progress Notes (Signed)
     [facility] Daily Rounding Note 06/04/2012, 9:01 AM  SUBJECTIVE:       BM last evening.  Nausea "about the same"  Some bilious emesis. Tolerating FL diet, MD advancing to carb mod diet today.  On po Reglan.  Abd pain "about the same", Dr Darnelle Catalan is reducing dose of oxycodone.   OBJECTIVE:         Vital signs in last 24 hours:    Temp:  [97.4 F (36.3 C)-97.9 F (36.6 C)] 97.6 F (36.4 C) (02/06 0537) Pulse Rate:  [63-71] 63  (02/06 0537) Resp:  [18-20] 18  (02/06 0537) BP: (125-136)/(55-78) 131/76 mmHg (02/06 0537) SpO2:  [93 %-99 %] 93 % (02/06 0537) Last BM Date: 06/03/12 General: looks the same:  Obese, not acutely ill but generally unwell.  Pleasant and comfortable   Heart: RRR Chest: clear B.  Breathing easily Abdomen: soft, minor tenderness in right abdomen, ND, obese,  BS active,   Extremities: no pedal edema Neuro/Psych:  Oriented x 3.  No gross deficits.   Intake/Output from previous day: 02/05 0701 - 02/06 0700 In: 3959 [P.O.:1080; I.V.:2879] Out: 3050 [Urine:3050]  Intake/Output this shift: Total I/O In: -  Out: 450 [Urine:450]  Lab Results:  [facility] 06/03/12 0625 06/02/12 0600  WBC 5.3 6.6  HGB 15.0 14.4  HCT 45.6 43.4  PLT 199 194   BMET  Basename 06/03/12 0625 06/02/12 0600  NA 136 132*  K 4.5 4.4  CL 101 97  CO2 25 26  GLUCOSE 130* 262*  BUN 5* 7  CREATININE 0.70 0.74  CALCIUM 9.3 8.8   LFT  Basename 06/01/12 0944  PROT 8.0  ALBUMIN 4.0  AST 13  ALT 6  ALKPHOS 84  BILITOT 0.4  BILIDIR <0.1  IBILI NOT CALCULATED   Studies/Results: Dg Abd 1 View 06/04/2012    IMPRESSION: Resolved small bowel dilatation, without acute finding.   Original Report Authenticated By: Jeronimo Greaves, M.D.     ASSESMENT: * Acute on chronic abd pain, nausea and vomiting. Extensive radiologic, endoscopic work up since 08/2011, and at other hospitals previous to that. May currently have bout of gastroenteritis. Despite normal GES in 09/2011, still suspect  gastroparesis. Unfortunately his daily narcotics exacerbate the nausea/vomiting. Ileus vs PSBO is radiologically resolved.  No current evidence of acute pancreatitis. Cymbalta, known to cause nausea, dose reduced 2/5.  Goal is to wean off. Not clear he will be nausea or pain free, may have to settle for occasional sxs, so long as he can hydrate and deep meds down.  * IDDM, poorly controlled. Mean plasma glucose of 318 by A1c. This is likely contributing to his N/V. * Oral candidiasis.  On magic moutwash since admission.     PLAN: *  Needs to have prearranged ROV with his primary MD within 10 days of discharge in order to keep close tabs on blood sugars and manage further taper of the Cymbalta and narcotics.  Pt needs to keep diary of his blood sugars and check them frequently.    LOS: 3 days   Jennye Moccasin  06/04/2012, 9:01 AM Pager: [phone]     I have taken an interval history, reviewed the chart and examined the patient. I agree with the Advanced Practitioner's note, impression and recommendations. Decrease narcotic usage, taper off Cymbalta, optimally control DM. Reglan and antiemetics for now. Continue PPI daily. No additional recommendations. Advance diet as tolerated. GI signing off.  Venita Lick. Russella Dar MD Clementeen Graham

## 2012-06-05 DIAGNOSIS — K5909 Other constipation: Secondary | ICD-10-CM

## 2012-06-05 LAB — BASIC METABOLIC PANEL
Calcium: 9.5 mg/dL (ref 8.4–10.5)
GFR calc Af Amer: >90 mL/min (ref >90)
GFR calc non Af Amer: >90 mL/min (ref >90)
Glucose, Bld: 217 mg/dL — ABNORMAL HIGH (ref 70–99)
Potassium: 4.3 mEq/L (ref 3.5–5.1)
Sodium: 137 mEq/L (ref 135–145)

## 2012-06-05 LAB — GLUCOSE, CAPILLARY: Glucose-Capillary: 240 mg/dL — ABNORMAL HIGH (ref 70–99)

## 2012-06-05 MED ORDER — OXYCODONE HCL ER 15 MG PO T12A
40.0000 mg | EXTENDED_RELEASE_TABLET | Freq: Two times a day (BID) | ORAL | Status: DC
  Administered 2012-06-05: 40 mg via ORAL
  Filled 2012-06-05: qty 1

## 2012-06-05 MED ORDER — OXYCODONE HCL 5 MG PO TABS
15.0000 mg | ORAL_TABLET | ORAL | Status: DC | PRN
  Administered 2012-06-05 – 2012-06-06 (×3): 15 mg via ORAL
  Filled 2012-06-05 (×3): qty 3

## 2012-06-05 MED ORDER — OXYCODONE HCL ER 10 MG PO T12A
20.0000 mg | EXTENDED_RELEASE_TABLET | ORAL | Status: AC
  Administered 2012-06-05: 20 mg via ORAL
  Filled 2012-06-05: qty 2

## 2012-06-05 NOTE — Progress Notes (Signed)
Clinical Social Work Department BRIEF PSYCHOSOCIAL ASSESSMENT 06/05/2012  Patient:  Ryan [name], Ryan [name]     Account Number:  [id number]     Admit date:  06/01/2012  Clinical Social Worker:  Illene Silver  Date/Time:  06/05/2012 09:34 PM  Referred by:  Physician  Date Referred:  06/05/2012 Referred for  SNF Placement   Other Referral:   Interview type:  Patient Other interview type:   Chart review.    PSYCHOSOCIAL DATA Living Status:  OTHER Admitted from facility:   Level of care:   Primary support name:   Primary support relationship to patient:   Degree of support available:    CURRENT CONCERNS Current Concerns  Post-Acute Placement   Other Concerns:    SOCIAL WORK ASSESSMENT / PLAN Spoke with pt re: role of CSW/dcp.  Pt agreeable to SNF placement in [place]  CSW to begin bed search.   Assessment/plan status:  Psychosocial Support/Ongoing Assessment of Needs Other assessment/ plan:   Information/referral to community resources:    PATIENT'S/FAMILY'S RESPONSE TO PLAN OF CARE: Pt pleasant and agreeable to his plan of care.

## 2012-06-05 NOTE — Evaluation (Signed)
Occupational Therapy Evaluation Patient Details Name: Ryan [name] MRN: [id number] DOB: [birth year] Today's Date: 06/05/2012 Time: 1125-1200 OT Time Calculation (min): 35 min  OT Assessment / Plan / Recommendation Clinical Impression  PT is a 62 yo male admitted with N/V and abdominal pain with multiple recent admissions for same symptoms.  Pt limited by chronic back pain and deficits below but would benefit from cont OT to increase I with basic adls so he can live safer at home.      OT Assessment  Patient needs continued OT Services    Follow Up Recommendations  SNF;Supervision/Assistance - 24 hour    Barriers to Discharge Decreased caregiver support has roommate who is unable to assist.  Equipment Recommendations  3 in 1 bedside comode    Recommendations for Other Services    Frequency  Min 2X/week    Precautions / Restrictions Precautions Precautions: Fall Precaution Comments: has taken several recent falls at home. Restrictions Weight Bearing Restrictions: No   Pertinent Vitals/Pain Pt with 8/10 chronic back pain.    ADL  Eating/Feeding: Simulated;Independent Where Assessed - Eating/Feeding: Chair Grooming: Performed;Wash/dry hands;Min guard Where Assessed - Grooming: Supported standing Upper Body Bathing: Simulated;Set up Where Assessed - Upper Body Bathing: Unsupported sitting Lower Body Bathing: Simulated;Minimal assistance Where Assessed - Lower Body Bathing: Supported sit to stand Upper Body Dressing: Simulated;Set up Where Assessed - Upper Body Dressing: Unsupported sitting Lower Body Dressing: Simulated;Minimal assistance Where Assessed - Lower Body Dressing: Supported sit to stand Toilet Transfer: Performed;Min guard Statistician Method: Sit to Barista: Comfort height toilet;Grab bars Toileting - Architect and Hygiene: Simulated;Min guard Where Assessed - Engineer, mining and Hygiene:  Standing Equipment Used: Rolling walker;Gait belt Transfers/Ambulation Related to ADLs: Pt walked into hallway slowly requring encouragment to go much more than 25 feet. ADL Comments: Pt needs min assist for LE adls due to ability to reach feet.    OT Diagnosis: Generalized weakness  OT Problem List: Decreased strength;Decreased activity tolerance;Decreased knowledge of use of DME or AE;Decreased knowledge of precautions;Obesity;Pain OT Treatment Interventions: Self-care/ADL training;DME and/or AE instruction;Therapeutic activities   OT Goals Acute Rehab OT Goals OT Goal Formulation: With patient Time For Goal Achievement: 06/19/12 Potential to Achieve Goals: Good ADL Goals Pt Will Perform Grooming: with modified independence;Standing at sink ADL Goal: Grooming - Progress: Goal set today Pt Will Perform Lower Body Bathing: with supervision;Sit to stand in shower;Sit to stand from chair ADL Goal: Lower Body Bathing - Progress: Goal set today Pt Will Perform Lower Body Dressing: with supervision;Sit to stand from chair ADL Goal: Lower Body Dressing - Progress: Goal set today Pt Will Perform Tub/Shower Transfer: Tub transfer;with supervision ADL Goal: Tub/Shower Transfer - Progress: Goal set today Additional ADL Goal #1: Pt will complete all aspects of toileting with S with 3:1 over commode. ADL Goal: Additional Goal #1 - Progress: Goal set today  Visit Information  Last OT Received On: 06/05/12 Assistance Needed: +1 PT/OT Co-Evaluation/Treatment: Yes    Subjective Data  Subjective: "I don't know how I am making it at home." Patient Stated Goal: to be more I.   Prior Functioning     Home Living Lives With: Other (Comment) (roommate who is unable to assist.) Available Help at Discharge: Other (Comment) (no assist) Type of Home: Mobile home Home Access: Stairs to enter Entrance Stairs-Number of Steps: 8 Entrance Stairs-Rails: Right Home Layout: One level Bathroom Shower/Tub:  Engineer, manufacturing systems: Standard Bathroom Accessibility: No Home Adaptive Equipment:  Shower chair with back Prior Function Level of Independence: Independent with assistive device(s) Able to Take Stairs?:  (many falls on stairs recently) Driving: No Vocation: On disability Comments: stated he has a Master's degree in SW Communication Communication: No difficulties Dominant Hand: Right         Vision/Perception Vision - History Baseline Vision: Wears glasses only for reading Patient Visual Report: No change from baseline Vision - Assessment Vision Assessment: Vision not tested   [employer] Overall Cognitive Status: Appears within functional limits for tasks assessed/performed Arousal/Alertness: Awake/alert Orientation Level: Appears intact for tasks assessed Behavior During Session: [facility] for tasks performed Cognition - Other Comments: slow to answer and process.  Guessing this is his baseline.    Extremity/Trunk Assessment Right Upper Extremity Assessment RUE ROM/Strength/Tone: Within functional levels RUE Sensation: WFL - Light Touch RUE Coordination: WFL - gross/fine motor Left Upper Extremity Assessment LUE ROM/Strength/Tone: Within functional levels LUE Sensation: WFL - Light Touch LUE Coordination: WFL - gross/fine motor Trunk Assessment Trunk Assessment: Normal     Mobility Bed Mobility Bed Mobility: Supine to Sit;Sitting - Scoot to Edge of Bed Supine to Sit: 5: Supervision Sitting - Scoot to Edge of Bed: 5: Supervision Details for Bed Mobility Assistance: cues to push off bed when standing. Transfers Transfers: Sit to Stand;Stand to Sit Sit to Stand: 4: Min guard Stand to Sit: 4: Min guard Details for Transfer Assistance: VC's for upright posture     Exercise     Balance Balance Balance Assessed: No   End of Session OT - End of Session Equipment Utilized During Treatment: Gait belt Activity Tolerance: Patient limited by  fatigue;Patient limited by pain Patient left: in chair;with call bell/phone within reach Nurse Communication: Mobility status  GO     Hope Budds 06/05/2012, 12:16 PM [phone]

## 2012-06-05 NOTE — Evaluation (Signed)
Physical Therapy Evaluation Patient Details Name: Ryan [name] MRN: [id number] DOB: 12-11-1950 Today's Date: 06/05/2012 Time: 1122-1200 PT Time Calculation (min): 38 min  PT Assessment / Plan / Recommendation Clinical Impression  Pt is a very pleasent 62 y.o. male  who presents with decreased functional mobility secondary to pain, weakness (R>L) and decreased activity tolerance. Pt will need continued skilled PT to progress activity and maximize functional mobility. Rec SNF upon d/c to gain maximal benefits and ensure safety for discharge.    PT Assessment  Patient needs continued PT services    Follow Up Recommendations  SNF       Barriers to Discharge Decreased caregiver support;Inaccessible home environment 8 steps to enter home ( multiple falls on steps) decreased assistive support available    Equipment Recommendations  Rolling walker with 5" wheels (pt has rollator but may need bariatric RW)    Recommendations for Other Services     Frequency Min 3X/week    Precautions / Restrictions Precautions Precautions: Fall Precaution Comments: has taken several recent falls at home. Restrictions Weight Bearing Restrictions: No   Pertinent Vitals/Pain 6/10      Mobility  Bed Mobility Bed Mobility: Supine to Sit;Sitting - Scoot to Edge of Bed Supine to Sit: 5: Supervision Sitting - Scoot to Edge of Bed: 5: Supervision Details for Bed Mobility Assistance: cues to push off bed when standing. Transfers Transfers: Sit to Stand;Stand to Sit Sit to Stand: 4: Min guard Stand to Sit: 4: Min guard Details for Transfer Assistance: VC's for upright posture Ambulation/Gait Ambulation/Gait Assistance: 4: Min guard (close supervision) Ambulation Distance (Feet): 30 Feet Assistive device: Rolling walker Ambulation/Gait Assistance Details: VC's for upright posture and proper use of rw.        PT Diagnosis: Difficulty walking;Abnormality of gait;Generalized weakness;Acute pain  PT  Problem List: Decreased strength;Decreased activity tolerance;Decreased balance;Decreased mobility;Decreased knowledge of use of DME;Pain PT Treatment Interventions: DME instruction;Gait training;Stair training;Functional mobility training;Therapeutic activities;Therapeutic exercise;Balance training;Patient/family education   PT Goals Acute Rehab PT Goals PT Goal Formulation: With patient Time For Goal Achievement: 06/12/12 Potential to Achieve Goals: Fair Pt will go Sit to Stand: with modified independence PT Goal: Sit to Stand - Progress: Goal set today Pt will go Stand to Sit: with modified independence PT Goal: Stand to Sit - Progress: Goal set today Pt will Ambulate: >150 feet;with modified independence PT Goal: Ambulate - Progress: Goal set today Pt will Go Up / Down Stairs: 6-9 stairs;with modified independence PT Goal: Up/Down Stairs - Progress: Goal set today  Visit Information  Last PT Received On: 06/05/12 Assistance Needed: +1    Subjective Data  Subjective: My back has been pretty bad Patient Stated Goal: to get stronger   Prior Functioning  Home Living Lives With: Other (Comment) (roommate who is unable to assist.) Available Help at Discharge: Other (Comment) (no assist) Type of Home: Mobile home Home Access: Stairs to enter Entrance Stairs-Number of Steps: 8 Entrance Stairs-Rails: Right Home Layout: One level Bathroom Shower/Tub: Associate Professor: No Home Adaptive Equipment: Shower chair with back Prior Function Level of Independence: Independent with assistive device(s) Able to Take Stairs?:  (many falls on stairs recently) Driving: No Vocation: On disability Comments: stated he has a Education administrator degree in SW Communication Communication: No difficulties Dominant Hand: Right    Cognition  Cognition Overall Cognitive Status: Appears within functional limits for tasks assessed/performed Arousal/Alertness:  Awake/alert Orientation Level: Appears intact for tasks assessed Behavior During Session: [facility] for  tasks performed Cognition - Other Comments: slow to answer and process.  Guessing this is his baseline.    Extremity/Trunk Assessment Right Upper Extremity Assessment RUE ROM/Strength/Tone: Within functional levels RUE Sensation: WFL - Light Touch RUE Coordination: WFL - gross/fine motor Left Upper Extremity Assessment LUE ROM/Strength/Tone: Within functional levels LUE Sensation: WFL - Light Touch LUE Coordination: WFL - gross/fine motor Right Lower Extremity Assessment RLE ROM/Strength/Tone: Deficits;Due to pain RLE ROM/Strength/Tone Deficits: arthritic changes (weakness R>L) Left Lower Extremity Assessment LLE ROM/Strength/Tone: Deficits;Due to pain LLE ROM/Strength/Tone Deficits: arthritic changes Trunk Assessment Trunk Assessment: Normal   Balance Balance Balance Assessed: No Static Standing Balance Static Standing - Balance Support: During functional activity;Bilateral upper extremity supported Static Standing - Level of Assistance: 5: Stand by assistance Static Standing - Comment/# of Minutes: 3 minutes, one notes loss of balance able to self correct using bilateral UE  End of Session PT - End of Session Equipment Utilized During Treatment: Gait belt Activity Tolerance: Patient limited by fatigue;Patient limited by pain Patient left: in chair;with call bell/phone within reach Nurse Communication: Mobility status  GP     Fabio Asa 06/05/2012, 1:12 PM Charlotte Crumb, PT DPT  [phone]

## 2012-06-05 NOTE — Progress Notes (Signed)
Inpatient Diabetes Program Recommendations  AACE/ADA: New Consensus Statement on Inpatient Glycemic Control (2013)  Target Ranges:  Prepandial:   less than 140 mg/dL      Peak postprandial:   less than 180 mg/dL (1-2 hours)      Critically ill patients:  140 - 180 mg/dL   Reason for Visit: Hyperglycemia  Inpatient Diabetes Program Recommendations Insulin - Basal: May want to restart home regimen of 75/25 before breakfast and supper, Pt takes 20 units bid, but could start with 15 units bid while here and titrate aas needed.  Note: Pt also takes lantus at home due to fact that 75/25 given at supper time will not meet his basal needs throughout the night into the am hours. Thank you, Lenor Coffin, RN, CNS, Diabetes Coordinator [phone])

## 2012-06-05 NOTE — Progress Notes (Addendum)
Clinical Social Work Department CLINICAL SOCIAL WORK PLACEMENT NOTE 06/05/2012  Patient:  Ryan [name],Ryan [name]  Account Number:  [id number] Admit date:  06/01/2012  Clinical Social Worker:  Pollyann Savoy, LCSW  Date/time:  06/05/2012 10:19 AM  Clinical Social Work is seeking post-discharge placement for this patient at the following level of care:   SKILLED NURSING   (*CSW will update this form in Epic as items are completed)   06/05/2012  Patient/family provided with [facility] Department of Clinical Social Work's list of facilities offering this level of care within the geographic area requested by the patient (or if unable, by the patient's family).  06/05/2012  Patient/family informed of their freedom to choose among providers that offer the needed level of care, that participate in Medicare, Medicaid or managed care program needed by the patient, have an available bed and are willing to accept the patient.  06/05/2012  Patient/family informed of [facility]' ownership interest in [facility], as well as of the fact that they are under no obligation to receive care at this facility.  PASARR submitted to EDS on  PASARR number received from EDS on   FL2 transmitted to all facilities in geographic area requested by pt/family on  06/05/2012 FL2 transmitted to all facilities within larger geographic area on   Patient informed that his/her managed care company has contracts with or will negotiate with  certain facilities, including the following:     Patient/family informed of bed offers received:  06/08/12 Patient chooses bed at [facility] Physician recommends and patient chooses bed at    Patient to be transferred to [facility] on 06-08-12  Patient to be transferred to facility by Private vehicle  The following physician request were entered in Epic:   Additional Comments: Pt has pre existing Pasarr number.  06-08-12 CSW gave patient offers and informed  patient that CSW will follow up around 11:30am for decision.

## 2012-06-05 NOTE — Progress Notes (Signed)
[facility] PROGRESS NOTE  Ryan [name] ZOX:[id number] DOB: [birth year] DOA: 06/01/2012 PCP: Warrick Parisian, MD  Brief narrative: Ryan [name] is a 62 year old man with a past medical history of hypertension, hyperlipidemia, diabetes, chronic back pain with chronic narcotic use, depression and anxiety who was admitted to the hospital on 06/01/2012 with intractable nausea, vomiting and abdominal pain. He has been seen by gastroenterology, who reports that he has had an extensive radiologic and endoscopic evaluation beginning May 2013 and at previous hospitals. He has had a normal gastric emptying study 2003 although it is felt that his symptoms could have a component of gastroparesis.  Assessment/Plan: Principal Problem:  *Abdominal pain with nausea and vomiting, rule out small bowel obstruction -No further radiographic or endoscopic evaluation felt to be necessary given normal studies in the past.  -Normal gastric emptying study done in 2013. -12 CT abd pelvis last year which were unremarkable -May 2013 he had an EGD which showed esophagitis and ulcer at GE junction, FU EGD in 9/13 was normal -Suspect periodic withdrawals/ narcotic bowel, in background of chronic narcotic dependence. -Stop IV narcotics, transition back to home narcotic regimen with oxycontin and oxycodone -tolerating regular diet and last BM yesterday - F/U KUB this a.m. Shows resolution of dilated bowel loops.  Active Problems:  Physical deconditioning -PT consultation requested.  ? SNF for rehab.   Chronic back pain -Continue Neurontin and analgesics.  Tobacco abuse -Tobacco cessation counseling provided. Refuses nicotine patch.   DIABETES MELLITUS, TYPE II, uncontrolled -Oral hypoglycemics on hold. Continue Lantus/SSI. -CBGs [id number]. Hemoglobin A1c markedly elevated at 12.9. -CBG stable   HYPERTENSION -Continue Cozaar.   GERD -Continue PPI therapy.   Hypokalemia -Replaced.   Candida infection,  oral -Continue oral nystatin.   Anxiety and depression / PTSD -Continue Xanax. -Continue Cymbalta.  Class II obesity - Status post dietitian evaluation 06/02/2012.  Code Status: Full. Family Communication: None at bedside. Disposition Plan: Home versus SNF depending on PT evaluation   Medical Consultants:  Dr. Claudette Head, gastroenterology.  Other Consultants:  Dietitian  PT  Anti-infectives:  None.  HPI/Subjective: Still complains of pain, IV dilaudid not helping enough, describes abd pain as sharp, stabbing  Objective: Filed Vitals:   06/04/12 1446 06/04/12 2127 06/05/12 0553 06/05/12 0730  BP: 123/56 137/82 142/80 141/85  Pulse: 70 75 66 68  Temp: 97.7 F (36.5 C) 97.8 F (36.6 C) 97.9 F (36.6 C) 97.8 F (36.6 C)  TempSrc: Oral Oral Oral Oral  Resp: 18 18 18 18   Height:      Weight:      SpO2: 95% 96% 96% 98%    Intake/Output Summary (Last 24 hours) at 06/05/12 1026 Last data filed at 06/05/12 0500  Gross per 24 hour  Intake   1424 ml  Output   4875 ml  Net  -3451 ml    Exam: Gen:  NAD Cardiovascular:  RRR, No M/R/G Respiratory:  Lungs CTAB Gastrointestinal:  Abdomen soft, NT/ND, + BS Extremities:  No C/E/C  Data Reviewed: Basic Metabolic Panel:  Lab 06/05/12 8119 06/03/12 0625 06/02/12 0600 06/01/12 0944 06/01/12 0555  NA 137 136 132* -- 140  K 4.3 4.5 -- -- --  CL 100 101 97 -- 99  CO2 30 25 26  -- 29  GLUCOSE 217* 130* 262* -- 316*  BUN 6 5* 7 -- 9  CREATININE 0.71 0.70 0.74 -- 0.680.66  CALCIUM 9.5 9.3 8.8 -- 10.3  MG -- -- -- 1.9 --  PHOS -- -- -- --  3.1   GFR Estimated Creatinine Clearance: 151 ml/min (by C-G formula based on Cr of 0.71). Liver Function Tests:  Lab 06/01/12 0944 06/01/12 0555  AST 13 9  ALT 6 5  ALKPHOS 84 86  BILITOT 0.4 0.3  PROT 8.0 8.1  ALBUMIN 4.0 4.0    Lab 06/01/12 0555  LIPASE 23  AMYLASE --   CBC:  Lab 06/03/12 0625 06/02/12 0600 06/01/12 0555  WBC 5.3 6.6 7.8  NEUTROABS -- -- 5.2   HGB 15.0 14.4 17.2*  HCT 45.6 43.4 50.6  MCV 95.0 95.2 94.6  PLT 199 194 247   CBG:  Lab 06/05/12 0808 06/04/12 2125 06/04/12 1702 06/04/12 1156 06/04/12 0735  GLUCAP 192* 181* 204* 181* 121*   Hgb A1c No results found for this basename: HGBA1C:2 in the last 72 hours Microbiology Recent Results (from the past 240 hour(s))  URINE CULTURE     Status: Normal   Collection Time   06/01/12  7:34 AM      Component Value Range Status Comment   Specimen Description URINE, RANDOM   Final    Special Requests NONE   Final    Culture  Setup Time 06/01/2012 09:00   Final    Colony Count 20,OOO COLONIES/ML   Final    Culture     Final    Value: Multiple bacterial morphotypes present, none predominant. Suggest appropriate recollection if clinically indicated.   Report Status 06/02/2012 FINAL   Final   CLOSTRIDIUM DIFFICILE BY PCR     Status: Normal   Collection Time   06/02/12  8:56 PM      Component Value Range Status Comment   C difficile by pcr NEGATIVE  NEGATIVE Final      Procedures and Diagnostic Studies: Dg Abd Acute W/chest  06/01/2012  *RADIOLOGY REPORT*  Clinical Data: The abdominal pain and vomiting.  ACUTE ABDOMEN SERIES (ABDOMEN 2 VIEW & CHEST 1 VIEW)  Comparison: 05/25/2012  Findings: Normal inspiration.  Normal heart size and pulmonary vascularity.  Peribronchial thickening and interstitial changes most suggestive of chronic bronchitis.  Old left rib fracture.  No focal consolidation or airspace disease.  No blunting of costophrenic angles.  No pneumothorax.  No significant change since previous chest.  Prominent loop of gas distended small bowel with mild fold thickening in the right abdomen with suggestion of additional distended loops centrally.  A few air-fluid levels are noted on the upright view.  Findings are suspicious for small bowel obstruction. There is a paucity of gas in the colon.  No free intra-abdominal air.  No radiopaque stones.  Postoperative changes in the right  upper quadrant and lumbar spine.  Degenerative changes in the hips.  IMPRESSION: Chronic bronchitic changes in the lungs.  No evidence of active pulmonary disease.  Development of mild gas distended small bowel in the mid abdomen with air-fluid levels suggesting obstruction.   Original Report Authenticated By: Burman Nieves, M.D.    Dg Abd 1 View  06/04/2012  *RADIOLOGY REPORT*  Clinical Data: Evaluate distended small bowel.  ABDOMEN - 1 VIEW  Comparison: Plain films of 06/01/2012.  Findings: Single supine view of the abdomen and pelvis.  The previously described small bowel distention in the right side of the abdomen has resolved.  Bowel gas pattern is within normal limits.  The peripheral abdomen and low pelvis are excluded. Cholecystectomy clips. Distal gas and stool.  Lower lumbar fixation. No pneumatosis or free intraperitoneal air.  IMPRESSION: Resolved small bowel dilatation, without  acute finding.   Original Report Authenticated By: Jeronimo Greaves, M.D.     Scheduled Meds:    . aspirin EC  81 mg Oral Daily  . DULoxetine  20 mg Oral Daily  . gabapentin  400 mg Oral TID  . heparin  5,000 Units Subcutaneous Q8H  . insulin aspart  0-15 Units Subcutaneous TID WC  . insulin glargine  8 Units Subcutaneous QHS  . lactulose  20 g Oral BID  . losartan  100 mg Oral Daily  . metoCLOPramide  10 mg Oral TID AC & HS  . nystatin  5 mL Mouth/Throat QID  . OxyCODONE  40 mg Oral Q12H  . pantoprazole  40 mg Oral BID  . potassium chloride  20 mEq Oral Daily   Continuous Infusions:    . sodium chloride 10 mL/hr at 06/05/12 0849    Time spent: 25 minutes.   LOS: 4 days   [facility]  [facility] Pager [phone].  If 8PM-8AM, please contact night-coverage at [email or web address], password [facility] 06/05/2012, 10:26 AM

## 2012-06-06 DIAGNOSIS — F1920 Other psychoactive substance dependence, uncomplicated: Secondary | ICD-10-CM

## 2012-06-06 LAB — GLUCOSE, CAPILLARY
Glucose-Capillary: 210 mg/dL — ABNORMAL HIGH (ref 70–99)
Glucose-Capillary: 229 mg/dL — ABNORMAL HIGH (ref 70–99)
Glucose-Capillary: 208 mg/dL — ABNORMAL HIGH (ref 70–99)
Glucose-Capillary: 208 mg/dL — ABNORMAL HIGH (ref 70–99)

## 2012-06-06 MED ORDER — OXYCODONE HCL ER 15 MG PO T12A
60.0000 mg | EXTENDED_RELEASE_TABLET | Freq: Two times a day (BID) | ORAL | Status: DC
  Administered 2012-06-06 – 2012-06-08 (×5): 60 mg via ORAL
  Filled 2012-06-06 (×5): qty 4

## 2012-06-06 MED ORDER — OXYCODONE HCL 5 MG PO TABS
30.0000 mg | ORAL_TABLET | ORAL | Status: DC | PRN
  Administered 2012-06-06 – 2012-06-08 (×11): 30 mg via ORAL
  Filled 2012-06-06 (×11): qty 6

## 2012-06-06 NOTE — Progress Notes (Signed)
[facility] PROGRESS NOTE  Ryan [name] YQM:[id number] DOB: [birth year] DOA: 06/01/2012 PCP: Warrick Parisian, MD  Brief narrative: [name] is a 62 year old man with a past medical history of hypertension, hyperlipidemia, diabetes, chronic back pain with chronic narcotic use, depression and anxiety who was admitted to the hospital on 06/01/2012 with intractable nausea, vomiting and abdominal pain. He has been seen by gastroenterology, who reports that he has had an extensive radiologic and endoscopic evaluation beginning May 2013 and at previous hospitals. He has had a normal gastric emptying study 2003 although it is felt that his symptoms could have a component of gastroparesis.  Assessment/Plan: Principal Problem:  *Abdominal pain with nausea and vomiting - persistent abd pain, no further vomiting -No further radiographic or endoscopic evaluation felt to be necessary given normal studies in the past.  -Normal gastric emptying study done in 2013. -12 CT abd pelvis last year which were unremarkable -May 2013 he had an EGD which showed esophagitis and ulcer at GE junction, FU EGD in 9/13 was normal -Suspect periodic withdrawals/ narcotic bowel, in background of chronic narcotic dependence. -Stopped IV narcotics, transitioned back to oxycontin and oxycodone, will increase dose to home dose. -tolerating regular diet and last BM yesterday - F/U KUB this a.m. Shows resolution of dilated bowel loops.  Active Problems:  Physical deconditioning -PT /OT consult noted, recommendation for SNF noted and patient requests and wants this too, await SNF at this point   Chronic back pain -Continue Neurontin and analgesics.  Tobacco abuse -Tobacco cessation counseling provided. Refuses nicotine patch.   DIABETES MELLITUS, TYPE II, uncontrolled -Oral hypoglycemics on hold. Continue Lantus/SSI. -CBGs [id number]. Hemoglobin A1c markedly elevated at 12.9. -CBG stable   HYPERTENSION -Continue  Cozaar.   GERD -Continue PPI therapy.   Hypokalemia -Replaced.   Candida infection, oral -Continue oral nystatin.   Anxiety and depression / PTSD -Continue Xanax. -Continue Cymbalta.  Class II obesity - Status post dietitian evaluation 06/02/2012.  Code Status: Full. Family Communication: None at bedside. Disposition Plan:  SNF Monday, d/w CSW on Friday   Medical Consultants:  Dr. Claudette Head, gastroenterology.  Other Consultants:  Dietitian  PT  Anti-infectives:  None.  HPI/Subjective: Still complains of pain, IV dilaudid not helping enough, describes abd pain as sharp, stabbing  Objective: Filed Vitals:   06/05/12 0730 06/05/12 1418 06/05/12 2157 06/06/12 0614  BP: 141/85 136/81 116/54 114/56  Pulse: 68 69 74 68  Temp: 97.8 F (36.6 C) 97.8 F (36.6 C) 97.8 F (36.6 C) 98.4 F (36.9 C)  TempSrc: Oral Oral Oral Oral  Resp: 18 20 18 18   Height:      Weight:      SpO2: 98% 97% 93% 98%    Intake/Output Summary (Last 24 hours) at 06/06/12 1012 Last data filed at 06/06/12 0405  Gross per 24 hour  Intake    720 ml  Output   1525 ml  Net   -805 ml    Exam: Gen:  NAD Cardiovascular:  RRR, No M/R/G Respiratory:  Lungs CTAB Gastrointestinal:  Abdomen soft, NT/ND, + BS Extremities:  No C/E/C  Data Reviewed: Basic Metabolic Panel:  Recent Labs Lab 06/01/12 0555 06/01/12 0944 06/02/12 0600 06/03/12 0625 06/05/12 0630  NA 140  --  132* 136 137  K 3.4*  --  4.4 4.5 4.3  CL 99  --  97 101 100  CO2 29  --  26 25 30   GLUCOSE 316*  --  262* 130* 217*  BUN 9  --  7 5* 6  CREATININE 0.68  0.66  --  0.74 0.70 0.71  CALCIUM 10.3  --  8.8 9.3 9.5  MG  --  1.9  --   --   --   PHOS 3.1  --   --   --   --    GFR Estimated Creatinine Clearance: 151 ml/min (by C-G formula based on Cr of 0.71). Liver Function Tests:  Recent Labs Lab 06/01/12 0555 06/01/12 0944  AST 9 13  ALT 5 6  ALKPHOS 86 84  BILITOT 0.3 0.4  PROT 8.1 8.0  ALBUMIN 4.0  4.0    Recent Labs Lab 06/01/12 0555  LIPASE 23   CBC:  Recent Labs Lab 06/01/12 0555 06/02/12 0600 06/03/12 0625  WBC 7.8 6.6 5.3  NEUTROABS 5.2  --   --   HGB 17.2* 14.4 15.0  HCT 50.6 43.4 45.6  MCV 94.6 95.2 95.0  PLT 247 194 199   CBG:  Recent Labs Lab 06/05/12 0808 06/05/12 1207 06/05/12 1721 06/05/12 2153 06/06/12 0801  GLUCAP 192* 202* 231* 240* 210*   Hgb A1c No results found for this basename: HGBA1C,  in the last 72 hours Microbiology Recent Results (from the past 240 hour(s))  URINE CULTURE     Status: None   Collection Time    06/01/12  7:34 AM      Result Value Range Status   Specimen Description URINE, RANDOM   Final   Special Requests NONE   Final   Culture  Setup Time 06/01/2012 09:00   Final   Colony Count 20,OOO COLONIES/ML   Final   Culture     Final   Value: Multiple bacterial morphotypes present, none predominant. Suggest appropriate recollection if clinically indicated.   Report Status 06/02/2012 FINAL   Final  CLOSTRIDIUM DIFFICILE BY PCR     Status: None   Collection Time    06/02/12  8:56 PM      Result Value Range Status   C difficile by pcr NEGATIVE  NEGATIVE Final     Procedures and Diagnostic Studies: Dg Abd Acute W/chest  06/01/2012  *RADIOLOGY REPORT*  Clinical Data: The abdominal pain and vomiting.  ACUTE ABDOMEN SERIES (ABDOMEN 2 VIEW & CHEST 1 VIEW)  Comparison: 05/25/2012  Findings: Normal inspiration.  Normal heart size and pulmonary vascularity.  Peribronchial thickening and interstitial changes most suggestive of chronic bronchitis.  Old left rib fracture.  No focal consolidation or airspace disease.  No blunting of costophrenic angles.  No pneumothorax.  No significant change since previous chest.  Prominent loop of gas distended small bowel with mild fold thickening in the right abdomen with suggestion of additional distended loops centrally.  A few air-fluid levels are noted on the upright view.  Findings are  suspicious for small bowel obstruction. There is a paucity of gas in the colon.  No free intra-abdominal air.  No radiopaque stones.  Postoperative changes in the right upper quadrant and lumbar spine.  Degenerative changes in the hips.  IMPRESSION: Chronic bronchitic changes in the lungs.  No evidence of active pulmonary disease.  Development of mild gas distended small bowel in the mid abdomen with air-fluid levels suggesting obstruction.   Original Report Authenticated By: Burman Nieves, M.D.    No results found.  Scheduled Meds: . aspirin EC  81 mg Oral Daily  . DULoxetine  20 mg Oral Daily  . gabapentin  400 mg Oral TID  . heparin  5,000 Units Subcutaneous Q8H  .  insulin aspart  0-15 Units Subcutaneous TID WC  . insulin glargine  8 Units Subcutaneous QHS  . lactulose  20 g Oral BID  . losartan  100 mg Oral Daily  . metoCLOPramide  10 mg Oral TID AC & HS  . nystatin  5 mL Mouth/Throat QID  . OxyCODONE  60 mg Oral Q12H  . pantoprazole  40 mg Oral BID  . potassium chloride  20 mEq Oral Daily   Continuous Infusions: . sodium chloride 10 mL/hr at 06/05/12 1100    Time spent: 25 minutes.   LOS: 5 days   [facility]  [facility] Pager [phone].  If 8PM-8AM, please contact night-coverage at [email or web address], password [facility] 06/06/2012, 10:12 AM

## 2012-06-06 NOTE — Progress Notes (Signed)
Patient would like oxycodone ir and oxycodone sr adjusted to home medication amounts. Stated he is not getting adequate pain relief. Please advise.

## 2012-06-07 LAB — GLUCOSE, CAPILLARY: Glucose-Capillary: 171 mg/dL — ABNORMAL HIGH (ref 70–99)

## 2012-06-07 MED ORDER — OXYCODONE HCL 30 MG PO TABS: 30.0000 mg | ORAL_TABLET | Freq: Four times a day (QID) | ORAL | Status: DC | PRN

## 2012-06-07 MED ORDER — INSULIN ASPART 100 UNIT/ML ~~LOC~~ SOLN: 0.0000 [IU] | Freq: Three times a day (TID) | SUBCUTANEOUS | Status: DC

## 2012-06-07 MED ORDER — HYOSCYAMINE SULFATE 0.125 MG SL SUBL: 0.1250 mg | SUBLINGUAL_TABLET | Freq: Four times a day (QID) | SUBLINGUAL | Status: DC | PRN

## 2012-06-07 MED ORDER — OXYCODONE HCL ER 60 MG PO T12A: 1.0000 | EXTENDED_RELEASE_TABLET | Freq: Two times a day (BID) | ORAL | Status: DC

## 2012-06-07 MED ORDER — NYSTATIN 100000 UNIT/ML MT SUSP: 5.0000 mL | Freq: Four times a day (QID) | OROMUCOSAL | Status: DC

## 2012-06-07 NOTE — Discharge Summary (Addendum)
[facility]                                                                                   [name], is a 62 y.o. male  DOB 06-17-1950  MRN [id number].  Admission date:  06/01/2012  Discharge Date:  06/08/2012  Primary MD  Warrick Parisian, MD  Follow up: Patient mentions that he takes 75/25 and Lantus which is a unique regimen-would like him to continue working with his PCP to bring his blood sugars under control as hemoglobin A1c is greater than 12  Please Check CBG TID AC/HS while at rehab facility Would like narcotics to be tapered if possible GI has also recommended tapering Cymbalta, I suppose, because it can cause nausea and abdominal pain.  Discharge Diagnosis     Principal Problem:   Abdominal pain Active Problems:   DIABETES MELLITUS, TYPE II   HYPERTENSION   GERD   Hypokalemia   Nausea and vomiting   Candida infection, oral   Anxiety and depression   Obesity, Class II, BMI 35-39.9   Physical deconditioning   PTSD (post-traumatic stress disorder)      Past Medical History  Diagnosis Date  . Hypertension   . DVT (deep venous thrombosis) ~ 2011    BLE  . 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 Hyperlipidemia   . CAD (coronary atherosclerotic disease)     s/p stents x2  . Small bowel obstruction   . Pancreatitis   . Anxiety   . GERD (gastroesophageal reflux disease)   . CHF (congestive heart failure)   . Myocardial infarction ~ 2009  . Anginal pain   . Pneumonia ~ 2012  . Shortness of breath     "lying down & w/exertion" (03/04/2012)  . Type II diabetes mellitus   . Hep B w/o coma 1970's    "I was in [place]" (03/04/2012)  . Chronic lower back pain   . PTSD (post-traumatic stress disorder)     Past Surgical History  Procedure Laterality Date  . Esophagogastroduodenoscopy  09/22/2011    Procedure: ESOPHAGOGASTRODUODENOSCOPY (EGD);  Surgeon: Charna Elizabeth, MD;  Location: [facility] ENDOSCOPY;  Service: Endoscopy;   Laterality: N/A;  . Esophagogastroduodenoscopy  01/05/2012    Procedure: ESOPHAGOGASTRODUODENOSCOPY (EGD);  Surgeon: Rachael Fee, MD;  Location: [facility] ENDOSCOPY;  Service: Endoscopy;  Laterality: N/A;  . Cholecystectomy  ~ 2009  . Spinal growth rods  ~ 2011  . Tonsillectomy and adenoidectomy    . Coronary angioplasty with stent placement  ~ 2009`    "1 + ! (after 1st one failed)" (03/04/2012)    Discharge Condition: Stable   Diet recommendation: See Discharge Instructions below   Consults GI   History of present illness and  Hospital Course:   Ryan [name] is a 62 y.o. male with a past medical history significant for hypertension, hyperlipidemia, diabetes, anxiety/depression and chronic back pain (with chronic use of narcotics); came to the hospital secondary to intractable nausea/vomiting and abdominal pain. Patient reports that this to ongoing problem for the last 6 months or so, and has required multiple admissions into the hospital; last one was  in January, 2014. At this time patient reports that his symptoms worsened over the last 2-3 days prior to admission and he has not been able to keep anything down, including his medications. In the ED he was found mildly dehydrated, with a mild hypokalemia, and with x-ray of his abdomen demonstrating early signs for SBO. [facility] has been called to admit the patient for further evaluation and treatment. Patient denies chest pain, shortness of breath, hematemesis, melena, hematochezia, headaches, blurred vision, cough, fever, chills or any other acute complaints. He reports that his last bowel movement was the day prior to admission, loose but without being watery diarrhea (most likely due to the use of lactulose).  -His nausea/abdominal pain is suspected by GI to be related to underlying gastroenteritis (although gastric emptying study in 2013 was normal)  Superimposed on chronic narcotic use. Abdominal films on admission showed mild  small bowel distention and air fluid levels.His symptoms improved with conservative treatment.  -He is been placed back on his usual home dose of narcotics for now with the hopes that they can be tapered as an outpatient by his PCP -He is advised per GI 2 continue twice a day PPI, taper off of Cymbalta, aggressively control blood sugars and decrease narcotics. -EGD on 5/13 which showed ulcerative esophagitis and abnormal looking ampulla EGD on 9/13. -Repeat EGD on 12/2011 showed a slightly abnormal ampulla but biopsy was benign. Patient never returned to see Dr. Marina Goodell. -He will need followup with Dr. Claudette Head for a possible EUS at a later date mentioned in Dr. Ardell Isaacs note.   chronic back pain/physical deconditioning  he will require continued PT OT and is being transferred to a rehabilitation facility.  Diabetes mellitus type 2  his A1c was markedly elevated at 12.9 suggesting poor control  his home medications have been resumed which include NovoLog 75/25 and Lantus sugars will be monitored carefully while at rehabilitation facility-    Candida   has been treated with oral nystatin  Smoker has been counseled to quit.   Today   Subjective:   [name] has no complaints and is tolerating his diet well Objective:   Blood pressure 133/77, pulse 71, temperature 97.5 F (36.4 C), temperature source Oral, resp. rate 16, height 6\' 5"  (1.956 m), weight 141.613 kg (312 lb 3.2 oz), SpO2 96.00%.   Intake/Output Summary (Last 24 hours) at 06/08/12 1307 Last data filed at 06/08/12 0526  Gross per 24 hour  Intake   1560 ml  Output    650 ml  Net    910 ml    Exam Awake Alert, Oriented *3,  Normal affect, morbidly obese [place].AT,PERRAL Supple Neck,No JVD, No cervical lymphadenopathy appriciated.  Symmetrical Chest wall movement, Good air movement bilaterally, CTAB RRR,No Gallops,Rubs or new Murmurs, No Parasternal Heave +ve B.Sounds, Abd Soft, Non tender, No organomegaly  appriciated, No rebound -guarding or rigidity. No Cyanosis, Clubbing or edema, No new Rash or bruise  Data Review   Major procedures and Radiology Reports -   Dg Abd 1 View  06/04/2012  *RADIOLOGY REPORT*  Clinical Data: Evaluate distended small bowel.  ABDOMEN - 1 VIEW  Comparison: Plain films of 06/01/2012.  Findings: Single supine view of the abdomen and pelvis.  The previously described small bowel distention in the right side of the abdomen has resolved.  Bowel gas pattern is within normal limits.  The peripheral abdomen and low pelvis are excluded. Cholecystectomy clips. Distal gas and stool.  Lower lumbar fixation. No  pneumatosis or free intraperitoneal air.  IMPRESSION: Resolved small bowel dilatation, without acute finding.   Original Report Authenticated By: Jeronimo Greaves, M.D.    Ct Head Wo Contrast  05/25/2012  *RADIOLOGY REPORT*  Clinical Data: Generalized weakness.  Fall.  Nasal congestion. A left sided headache.  Left upper and lower extremity weakness. Nausea and vomiting.  CT HEAD WITHOUT CONTRAST  Technique:  Contiguous axial images were obtained from the base of the skull through the vertex without contrast.  Comparison: CT head without contrast 11/01/2011.  Findings: No acute cortical infarct, hemorrhage, or mass lesion is present.  The ventricles are of normal size.  Mild generalized atrophy is stable.  No significant extra-axial fluid collection is present.  The paranasal sinuses and mastoid air cells are clear.  The osseous skull is intact.  No significant extracranial soft tissue injury is evident.  IMPRESSION:  1.  No acute intracranial abnormality or significant interval change. 2.  Mild generalized atrophy is stable.   Original Report Authenticated By: Marin Roberts, M.D.    Dg Abd Acute W/chest  06/01/2012  *RADIOLOGY REPORT*  Clinical Data: The abdominal pain and vomiting.  ACUTE ABDOMEN SERIES (ABDOMEN 2 VIEW & CHEST 1 VIEW)  Comparison: 05/25/2012  Findings: Normal  inspiration.  Normal heart size and pulmonary vascularity.  Peribronchial thickening and interstitial changes most suggestive of chronic bronchitis.  Old left rib fracture.  No focal consolidation or airspace disease.  No blunting of costophrenic angles.  No pneumothorax.  No significant change since previous chest.  Prominent loop of gas distended small bowel with mild fold thickening in the right abdomen with suggestion of additional distended loops centrally.  A few air-fluid levels are noted on the upright view.  Findings are suspicious for small bowel obstruction. There is a paucity of gas in the colon.  No free intra-abdominal air.  No radiopaque stones.  Postoperative changes in the right upper quadrant and lumbar spine.  Degenerative changes in the hips.  IMPRESSION: Chronic bronchitic changes in the lungs.  No evidence of active pulmonary disease.  Development of mild gas distended small bowel in the mid abdomen with air-fluid levels suggesting obstruction.   Original Report Authenticated By: Burman Nieves, M.D.    Dg Abd Acute W/chest  05/25/2012  *RADIOLOGY REPORT*  Clinical Data: Left upper chest pain.  Mid abdominal pain.  Nausea. Recent fall.  ACUTE ABDOMEN SERIES (ABDOMEN 2 VIEW & CHEST 1 VIEW)  Comparison: Chest radiograph 05/04/2012.  Findings: Lung volumes are low.  There is no plain film evidence of free air.  Cardiopericardial silhouette appears within normal limits.  No focal airspace opacity. Cholecystectomy clips are present in the right upper quadrant.  Bowel gas pattern is nonobstructive.  Lumbar fusion is present.  Stool and bowel gas extends to the level of the rectosigmoid.  IMPRESSION:  1.  Nonobstructive bowel gas pattern. 2.  Low volume chest.   Original Report Authenticated By: Andreas Newport, M.D.     Micro Results      Recent Results (from the past 240 hour(s))  URINE CULTURE     Status: None   Collection Time    06/01/12  7:34 AM      Result Value Range Status    Specimen Description URINE, RANDOM   Final   Special Requests NONE   Final   Culture  Setup Time 06/01/2012 09:00   Final   Colony Count 20,OOO COLONIES/ML   Final   Culture     Final  Value: Multiple bacterial morphotypes present, none predominant. Suggest appropriate recollection if clinically indicated.   Report Status 06/02/2012 FINAL   Final  CLOSTRIDIUM DIFFICILE BY PCR     Status: None   Collection Time    06/02/12  8:56 PM      Result Value Range Status   C difficile by pcr NEGATIVE  NEGATIVE Final     CBC w Diff: Lab Results  Component Value Date   WBC 5.3 06/03/2012   HGB 15.0 06/03/2012   HCT 45.6 06/03/2012   PLT 199 06/03/2012   LYMPHOPCT 24 06/01/2012   BANDSPCT SPECIMEN CLOTTED 03/01/2012   MONOPCT 10 06/01/2012   EOSPCT 0 06/01/2012   BASOPCT 0 06/01/2012    CMP: Lab Results  Component Value Date   NA 137 06/05/2012   K 4.3 06/05/2012   CL 100 06/05/2012   CO2 30 06/05/2012   BUN 6 06/05/2012   CREATININE 0.71 06/05/2012   PROT 8.0 06/01/2012   ALBUMIN 4.0 06/01/2012   BILITOT 0.4 06/01/2012   ALKPHOS 84 06/01/2012   AST 13 06/01/2012   ALT 6 06/01/2012  .   Discharge Instructions     Follow with Primary MD Warrick Parisian, MD in 7 days   Get CBC, CMP, checked 7 days by Primary MD and again as instructed by your Primary MD.   Get Medicines reviewed and adjusted.  Please request your Prim.MD to go over all Hospital Tests and Procedure/Radiological results at the follow up, please get all Hospital records sent to your Prim MD by signing hospital release before you go home.  Activity: As tolerated with Full fall precautions use walker/cane & assistance as needed   Diet:  Low carb heart healthy  For Heart failure patients - Check your Weight same time everyday, if you gain over 2 pounds, or you develop in leg swelling, experience more shortness of breath or chest pain, call your Primary MD immediately. Follow Cardiac Low Salt Diet and 1.8 lit/day fluid restriction.  Disposition  Home    If you experience worsening of your admission symptoms, develop shortness of breath, life threatening emergency, suicidal or homicidal thoughts you must seek medical attention immediately by calling 911 or calling your MD immediately  if symptoms less severe.  You Must read complete instructions/literature along with all the possible adverse reactions/side effects for all the Medicines you take and that have been prescribed to you. Take any new Medicines after you have completely understood and accpet all the possible adverse reactions/side effects.   Do not drive and provide baby sitting services if your were admitted for syncope or siezures until you have seen by Primary MD or a Neurologist and advised to do so again.  Do not drive when taking Pain medications.    Do not take more than prescribed Pain, Sleep and Anxiety Medications  Special Instructions: If you have smoked or chewed Tobacco  in the last 2 yrs please stop smoking, stop any regular Alcohol  and or any Recreational drug use.  Wear Seat belts while driving.      Follow-up Information   Follow up with Warrick Parisian, MD. Schedule an appointment as soon as possible for a visit in 1 week.   Contact information:   [address] [place] [address] [phone]       Follow up with Judie Petit T. Russella Dar, MD,FACG. Schedule an appointment as soon as possible for a visit in 1 week.   Contact information:   [address]  [place] [address] [phone]         Discharge Medications     Medication List    STOP taking these medications       ZANTAC PO      TAKE these medications       ALPRAZolam 1 MG tablet  Commonly known as:  XANAX  Take 1 tablet (1 mg total) by mouth 2 (two) times daily.     aspirin EC 81 MG tablet  Take 81 mg by mouth daily.     DULoxetine 30 MG capsule  Commonly known as:  CYMBALTA  Take 1 capsule (30 mg total) by mouth daily.     gabapentin 400 MG capsule   Commonly known as:  NEURONTIN  Take 1 capsule (400 mg total) by mouth 3 (three) times daily.     glipiZIDE 10 MG 24 hr tablet  Commonly known as:  GLUCOTROL XL  Take 10 mg by mouth 2 (two) times daily.     hyoscyamine 0.125 MG SL tablet  Commonly known as:  LEVSIN SL  Place 1 tablet (0.125 mg total) under the tongue every 6 (six) hours as needed for cramping.     insulin glargine 100 UNIT/ML injection  Commonly known as:  LANTUS  Inject 10 Units into the skin at bedtime.     insulin lispro protamine-insulin lispro (75-25) 100 UNIT/ML Susp  Commonly known as:  HUMALOG 75/25  Inject 12 Units into the skin 2 (two) times daily with a meal.     lactulose 10 GM/15ML solution  Commonly known as:  CHRONULAC  Take 20 g by mouth 2 (two) times daily.     losartan 100 MG tablet  Commonly known as:  COZAAR  Take 100 mg by mouth daily.     metFORMIN 850 MG tablet  Commonly known as:  GLUCOPHAGE  Take 850 mg by mouth 3 (three) times daily.     metoCLOPramide 10 MG tablet  Commonly known as:  REGLAN  Take 10 mg by mouth every 6 (six) hours as needed. For nausea     nystatin 100000 UNIT/ML suspension  Commonly known as:  MYCOSTATIN  Take 5 mLs (500,000 Units total) by mouth 4 (four) times daily. For 4 more days.     ondansetron 4 MG tablet  Commonly known as:  ZOFRAN  Take 4 mg by mouth every 6 (six) hours as needed. For nausea     oxycodone 30 MG immediate release tablet  Commonly known as:  ROXICODONE  Take 1 tablet (30 mg total) by mouth every 6 (six) hours as needed for pain. For breakthrough pain     OxyCODONE HCl ER 60 MG T12a  Take 1 tablet by mouth every 12 (twelve) hours.     pantoprazole 40 MG tablet  Commonly known as:  PROTONIX  Take 40 mg by mouth daily.     sennosides-docusate sodium 8.6-50 MG tablet  Commonly known as:  SENOKOT-S  Take 2 tablets by mouth daily. For constipation           Total Time in preparing paper work, data evaluation and todays  exam >45 minutes  Alesandra Smart M.D on 06/08/2012 at 1:07 PM  [facility] Office  [phone]

## 2012-06-08 DIAGNOSIS — K219 Gastro-esophageal reflux disease without esophagitis: Secondary | ICD-10-CM

## 2012-06-08 LAB — GLUCOSE, CAPILLARY: Glucose-Capillary: 191 mg/dL — ABNORMAL HIGH (ref 70–99)

## 2012-06-08 MED ORDER — LACTULOSE 10 GM/15ML PO SOLN
20.0000 g | Freq: Every day | ORAL | Status: DC
  Filled 2012-06-08: qty 30

## 2012-06-08 MED ORDER — INSULIN GLARGINE 100 UNIT/ML ~~LOC~~ SOLN: 10.0000 [IU] | Freq: Every day | SUBCUTANEOUS | Status: DC

## 2012-06-08 NOTE — Clinical Social Work Note (Addendum)
Clinical Social Worker followed up with patient regarding confirmation on SNF choice, and patient reported that he is confirming [facility]. CSW spoke with Crystal at facility and they will be able to take patient today. Crystal reported that she will run his insurance and follow back up with CSW. Patient reported that he will make some calls regarding transportation to the SNF. CSW will notify MD regarding bed decision and will facilitate discharge when medically stable.   CSW facilitated discharge by contacting patient and facility, [facility]. Patient will be transported via private vehicle of family member. Patient's discharge packet will be given to patient. CSW will sign off, as social work intervention is no longer needed.   Rozetta Nunnery MSW, [employer] [phone]

## 2012-06-08 NOTE — Progress Notes (Signed)
Patient discharged to [facility].  Report called to nurse at [facility].  Patient's friend here to transport patient.  Vital signs stable, tolerating diet without complaints of nausea.  Discharged per wheelchair with friend.

## 2012-06-08 NOTE — Progress Notes (Signed)
Physical Therapy Treatment Patient Details Name: Ryan [name] MRN: [id number] DOB: 04-09-1951 Today's Date: 06/08/2012 Time: 4098-1191 PT Time Calculation (min): 28 min  PT Assessment / Plan / Recommendation Comments on Treatment Session  Pt continues to make steady progress towards PT goals at this time. Pt able to increase ambulation and activity today with rest breaks intermittantly. Will continue to monitor and progress activity as tolerated.    Follow Up Recommendations  SNF           Equipment Recommendations  Rolling walker with 5" wheels (pt has rollator but may need bariatric RW)    Recommendations for Other Services    Frequency Min 3X/week   Plan Discharge plan remains appropriate    Precautions / Restrictions Precautions Precautions: Fall Precaution Comments: has taken several recent falls at home. Restrictions Weight Bearing Restrictions: No   Pertinent Vitals/Pain 4/10 LB pain    Mobility  Bed Mobility Bed Mobility: Supine to Sit;Sitting - Scoot to Edge of Bed Supine to Sit: 5: Supervision Sitting - Scoot to Edge of Bed: 5: Supervision Details for Bed Mobility Assistance: cues to push off bed when standing. Transfers Transfers: Sit to Stand;Stand to Sit Sit to Stand: 4: Min guard Stand to Sit: 4: Min guard Details for Transfer Assistance: VC's for upright posture (difficulty with controlled sitting) Ambulation/Gait Ambulation/Gait Assistance: 4: Min guard (close supervision) Ambulation Distance (Feet): 80 Feet Assistive device: Rolling walker Ambulation/Gait Assistance Details: VC's for upright posture, speed increases, and mechanics on turns Gait Pattern: Step-to pattern;Decreased stride length;Narrow base of support;Trunk flexed Gait velocity: decreased General Gait Details: max VC's for upright posture and increased speed Stairs: No         PT Goals Acute Rehab PT Goals PT Goal Formulation: With patient Time For Goal Achievement:  06/12/12 Potential to Achieve Goals: Fair Pt will go Sit to Stand: with modified independence PT Goal: Sit to Stand - Progress: Progressing toward goal Pt will go Stand to Sit: with modified independence PT Goal: Stand to Sit - Progress: Progressing toward goal Pt will Ambulate: >150 feet;with modified independence PT Goal: Ambulate - Progress: Progressing toward goal Pt will Go Up / Down Stairs: 6-9 stairs;with modified independence  Visit Information  Last PT Received On: 06/08/12 Assistance Needed: +1    Subjective Data  Subjective: I got cleaned up for you Patient Stated Goal: to get stronger   Cognition  Cognition Overall Cognitive Status: Appears within functional limits for tasks assessed/performed Arousal/Alertness: Awake/alert Orientation Level: Appears intact for tasks assessed Behavior During Session: [facility] for tasks performed Cognition - Other Comments: slow to answer and process.  Guessing this is his baseline.    Balance  Balance Balance Assessed: Yes Static Standing Balance Static Standing - Balance Support: During functional activity;Bilateral upper extremity supported Static Standing - Level of Assistance: 5: Stand by assistance  End of Session PT - End of Session Equipment Utilized During Treatment: Gait belt Activity Tolerance: Patient limited by fatigue;Patient limited by pain Patient left: in chair;with call bell/phone within reach Nurse Communication: Mobility status   GP     Fabio Asa 06/08/2012, 12:30 PM Charlotte Crumb, PT DPT  [phone]

## 2012-06-08 NOTE — Progress Notes (Signed)
[facility]  I have examined the patient, discussed discharge and discussed medication regimen. I have modified the discharge summary dictated by Dr. Thedore Mins on 2/9.  Nareh Matzke

## 2012-06-30 ENCOUNTER — Telehealth: Payer: Self-pay | Admitting: Internal Medicine

## 2012-06-30 ENCOUNTER — Ambulatory Visit: Payer: Medicare Other | Admitting: Internal Medicine

## 2012-07-08 ENCOUNTER — Encounter: Payer: Self-pay | Admitting: Internal Medicine

## 2012-07-08 ENCOUNTER — Ambulatory Visit (INDEPENDENT_AMBULATORY_CARE_PROVIDER_SITE_OTHER): Payer: PRIVATE HEALTH INSURANCE | Admitting: Internal Medicine

## 2012-07-08 VITALS — Ht 77.0 in | Wt 330.0 lb

## 2012-07-08 MED ORDER — PANTOPRAZOLE SODIUM 40 MG PO TBEC: 40.0000 mg | DELAYED_RELEASE_TABLET | Freq: Every day | ORAL | Status: DC

## 2012-07-08 MED ORDER — LACTULOSE 10 GM/15ML PO SOLN: 20.0000 g | Freq: Two times a day (BID) | ORAL | Status: DC

## 2012-07-08 NOTE — Patient Instructions (Addendum)
We have sent the following medications to your pharmacy for you to pick up at your convenience:  Pantoprazole and Lactulose

## 2012-07-08 NOTE — Progress Notes (Signed)
HISTORY OF PRESENT ILLNESS:  Ryan [name] is a 62 y.o. male with hypertension, hyperlipidemia, diabetes, morbid obesity, coronary artery disease, congestive heart failure, anxiety/depression, and chronic back pain with chronic narcotic use. He has been evaluated in the hospital on multiple occasions for recurrent abdominal pain with nausea and vomiting. I last saw the patient for post hospital followup 12/12/2011. His chronic abdominal pain was to functional and/or secondary to narcotics. Previous endoscopy revealed esophagitis with proximal gastric ulcer and questionable ampullary abnormality for which she underwent upper endoscopy by Dr. Christella Hartigan (01/05/2012). No need for EUS as the esophagitis and proximal gastric ulcer were healed and the ampulla was bulbous but normal on biopsy. He has continued on PPI and asked to avoid NSAIDs. Recently hospitalized with recurrent abdominal pain (reviewed) and seen by GI in consultation. No new or different abnormalities found. The have narcotic ileus and constipation. Treated with lactulose. Sent for followup. Patient is pleased to report to me that he is feeling significantly better. He is moving his bowels regularly and has no abdominal pain. He requests a refill lactulose. He has been compliant with pantoprazole. He does take aspirin but avoids additional NSAIDs. Still on significant narcotic pain medication. He also reports multiple GI evaluations elsewhere including cholecystectomy at a [facility] and [facility] [place].  REVIEW OF SYSTEMS:  All non-GI ROS negative except for back pain, sleeping problems, urinary leakage, anxiety, cough  Past Medical History  Diagnosis Date  . Hypertension   . DVT (deep venous thrombosis) ~ 2011    BLE  . 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 Hyperlipidemia   . CAD (coronary atherosclerotic disease)     s/p stents x2  . Small bowel obstruction   . Pancreatitis   . Anxiety   . GERD  (gastroesophageal reflux disease)   . CHF (congestive heart failure)   . Myocardial infarction ~ 2009  . Anginal pain   . Pneumonia ~ 2012  . Shortness of breath     "lying down & w/exertion" (03/04/2012)  . Type II diabetes mellitus   . Hep B w/o coma 1970's    "I was in [place]" (03/04/2012)  . Chronic lower back pain   . PTSD (post-traumatic stress disorder)     Past Surgical History  Procedure Laterality Date  . Esophagogastroduodenoscopy  09/22/2011    Procedure: ESOPHAGOGASTRODUODENOSCOPY (EGD);  Surgeon: Charna Elizabeth, MD;  Location: [facility] ENDOSCOPY;  Service: Endoscopy;  Laterality: N/A;  . Esophagogastroduodenoscopy  01/05/2012    Procedure: ESOPHAGOGASTRODUODENOSCOPY (EGD);  Surgeon: Rachael Fee, MD;  Location: [facility] ENDOSCOPY;  Service: Endoscopy;  Laterality: N/A;  . Cholecystectomy  ~ 2009  . Spinal growth rods  ~ 2011  . Tonsillectomy and adenoidectomy    . Coronary angioplasty with stent placement  ~ 2009`    "1 + ! (after 1st one failed)" (03/04/2012)    Social History KADENCE MIMBS  reports that he has been smoking Cigarettes.  He has a 19 pack-year smoking history. He has never used smokeless tobacco. He reports that  drinks alcohol. He reports that he uses illicit drugs (Marijuana).  family history includes Diabetes in his father.  There is no history of Colon cancer.  Allergies  Allergen Reactions  . Lisinopril Swelling and Rash  . Morphine And Related Swelling and Rash       PHYSICAL EXAMINATION: Vital signs: Ht 6\' 5"  (1.956 m)  Wt 330 lb (149.687 kg)  BMI 39.12 kg/m2  General: Well-developed, obese, well-nourished, no acute distress HEENT: Sclerae are anicteric, conjunctiva pink. Oral mucosa intact Lungs: Clear Heart: Regular Abdomen: soft, these, nontender, nondistended, no obvious ascites, no peritoneal signs, normal bowel sounds. No organomegaly. Extremities: 1+ edema bilaterally Psychiatric: alert and oriented x3. Cooperative   ASSESSMENT:  #1.  Chronic abdominal pain. Functional. Improved with lactulose #2. Chronic constipation. Improved with lactulose #3. GERD. On PPI #4. History of NSAID ulcer. Resolved #5. Status post cholecystectomy #6. Multiple medical problems    PLAN:  #1. Continue lactulose for constipation. Refill #2. Continue PPI for GERD and history of ulcer. Refill #3. Minimize narcotics if possible #4. Resume general medical care PCP. GI followup when necessary

## 2012-07-19 ENCOUNTER — Telehealth (HOSPITAL_COMMUNITY): Payer: Self-pay | Admitting: Emergency Medicine

## 2012-07-19 NOTE — ED Notes (Signed)
No response to letter sent after 30 days. Chart sent to Medical Records. °

## 2012-07-22 ENCOUNTER — Other Ambulatory Visit: Payer: Self-pay | Admitting: Pain Medicine

## 2012-07-22 DIAGNOSIS — M545 Low back pain: Secondary | ICD-10-CM

## 2012-07-25 ENCOUNTER — Encounter (HOSPITAL_COMMUNITY): Payer: Self-pay | Admitting: Cardiology

## 2012-07-25 ENCOUNTER — Emergency Department (HOSPITAL_COMMUNITY): Payer: Medicare Other

## 2012-07-25 ENCOUNTER — Emergency Department (HOSPITAL_COMMUNITY)
Admission: EM | Admit: 2012-07-25 | Discharge: 2012-07-25 | Disposition: A | Discharge status: Home / Self Care | Payer: Medicare Other | Attending: Emergency Medicine | Admitting: Emergency Medicine

## 2012-07-25 DIAGNOSIS — Z7982 Long term (current) use of aspirin: Secondary | ICD-10-CM | POA: Insufficient documentation

## 2012-07-25 DIAGNOSIS — Z79899 Other long term (current) drug therapy: Secondary | ICD-10-CM | POA: Insufficient documentation

## 2012-07-25 DIAGNOSIS — I2510 Atherosclerotic heart disease of native coronary artery without angina pectoris: Secondary | ICD-10-CM | POA: Insufficient documentation

## 2012-07-25 DIAGNOSIS — I509 Heart failure, unspecified: Secondary | ICD-10-CM | POA: Insufficient documentation

## 2012-07-25 DIAGNOSIS — F411 Generalized anxiety disorder: Secondary | ICD-10-CM | POA: Insufficient documentation

## 2012-07-25 DIAGNOSIS — I252 Old myocardial infarction: Secondary | ICD-10-CM | POA: Insufficient documentation

## 2012-07-25 DIAGNOSIS — Z95810 Presence of automatic (implantable) cardiac defibrillator: Secondary | ICD-10-CM | POA: Insufficient documentation

## 2012-07-25 DIAGNOSIS — Z8719 Personal history of other diseases of the digestive system: Secondary | ICD-10-CM | POA: Insufficient documentation

## 2012-07-25 DIAGNOSIS — Z86718 Personal history of other venous thrombosis and embolism: Secondary | ICD-10-CM | POA: Insufficient documentation

## 2012-07-25 DIAGNOSIS — R142 Eructation: Secondary | ICD-10-CM | POA: Insufficient documentation

## 2012-07-25 DIAGNOSIS — Z8709 Personal history of other diseases of the respiratory system: Secondary | ICD-10-CM | POA: Insufficient documentation

## 2012-07-25 DIAGNOSIS — E119 Type 2 diabetes mellitus without complications: Secondary | ICD-10-CM | POA: Insufficient documentation

## 2012-07-25 DIAGNOSIS — Z794 Long term (current) use of insulin: Secondary | ICD-10-CM | POA: Insufficient documentation

## 2012-07-25 DIAGNOSIS — Z862 Personal history of diseases of the blood and blood-forming organs and certain disorders involving the immune mechanism: Secondary | ICD-10-CM | POA: Insufficient documentation

## 2012-07-25 DIAGNOSIS — G8929 Other chronic pain: Secondary | ICD-10-CM | POA: Insufficient documentation

## 2012-07-25 DIAGNOSIS — M545 Low back pain, unspecified: Secondary | ICD-10-CM | POA: Insufficient documentation

## 2012-07-25 DIAGNOSIS — Z9089 Acquired absence of other organs: Secondary | ICD-10-CM | POA: Insufficient documentation

## 2012-07-25 DIAGNOSIS — I10 Essential (primary) hypertension: Secondary | ICD-10-CM | POA: Insufficient documentation

## 2012-07-25 DIAGNOSIS — F17200 Nicotine dependence, unspecified, uncomplicated: Secondary | ICD-10-CM | POA: Insufficient documentation

## 2012-07-25 DIAGNOSIS — Z8701 Personal history of pneumonia (recurrent): Secondary | ICD-10-CM | POA: Insufficient documentation

## 2012-07-25 DIAGNOSIS — Z8611 Personal history of tuberculosis: Secondary | ICD-10-CM | POA: Insufficient documentation

## 2012-07-25 DIAGNOSIS — K56609 Unspecified intestinal obstruction, unspecified as to partial versus complete obstruction: Secondary | ICD-10-CM | POA: Insufficient documentation

## 2012-07-25 DIAGNOSIS — R509 Fever, unspecified: Secondary | ICD-10-CM | POA: Insufficient documentation

## 2012-07-25 DIAGNOSIS — Z8659 Personal history of other mental and behavioral disorders: Secondary | ICD-10-CM | POA: Insufficient documentation

## 2012-07-25 DIAGNOSIS — Z8619 Personal history of other infectious and parasitic diseases: Secondary | ICD-10-CM | POA: Insufficient documentation

## 2012-07-25 DIAGNOSIS — R109 Unspecified abdominal pain: Secondary | ICD-10-CM

## 2012-07-25 DIAGNOSIS — K219 Gastro-esophageal reflux disease without esophagitis: Secondary | ICD-10-CM | POA: Insufficient documentation

## 2012-07-25 DIAGNOSIS — R141 Gas pain: Secondary | ICD-10-CM | POA: Insufficient documentation

## 2012-07-25 DIAGNOSIS — R61 Generalized hyperhidrosis: Secondary | ICD-10-CM | POA: Insufficient documentation

## 2012-07-25 DIAGNOSIS — Z8679 Personal history of other diseases of the circulatory system: Secondary | ICD-10-CM | POA: Insufficient documentation

## 2012-07-25 DIAGNOSIS — Z8639 Personal history of other endocrine, nutritional and metabolic disease: Secondary | ICD-10-CM | POA: Insufficient documentation

## 2012-07-25 DIAGNOSIS — R112 Nausea with vomiting, unspecified: Secondary | ICD-10-CM | POA: Insufficient documentation

## 2012-07-25 LAB — URINALYSIS, ROUTINE W REFLEX MICROSCOPIC
Hgb urine dipstick: NEGATIVE
Nitrite: NEGATIVE
Specific Gravity, Urine: 1.022 (ref 1.005–1.030)
Urobilinogen, UA: 2 mg/dL — ABNORMAL HIGH (ref 0.0–1.0)

## 2012-07-25 LAB — CBC WITH DIFFERENTIAL/PLATELET
Basophils Relative: 0 % (ref 0–1)
Hemoglobin: 15.7 g/dL (ref 13.0–17.0)
Lymphocytes Relative: 32 % (ref 12–46)
Lymphs Abs: 2 10*3/uL (ref 0.7–4.0)
Monocytes Relative: 14 % — ABNORMAL HIGH (ref 3–12)
Neutro Abs: 3.1 10*3/uL (ref 1.7–7.7)
Neutrophils Relative %: 52 % (ref 43–77)
RBC: 5.03 MIL/uL (ref 4.22–5.81)
WBC: 6 10*3/uL (ref 4.0–10.5)

## 2012-07-25 LAB — BASIC METABOLIC PANEL
BUN: 7 mg/dL (ref 6–23)
Chloride: 100 mEq/L (ref 96–112)
GFR calc Af Amer: >90 mL/min (ref >90)
Glucose, Bld: 183 mg/dL — ABNORMAL HIGH (ref 70–99)
Potassium: 3.4 mEq/L — ABNORMAL LOW (ref 3.5–5.1)

## 2012-07-25 LAB — URINE MICROSCOPIC-ADD ON

## 2012-07-25 LAB — GLUCOSE, CAPILLARY: Glucose-Capillary: 157 mg/dL — ABNORMAL HIGH (ref 70–99)

## 2012-07-25 MED ORDER — ONDANSETRON HCL 4 MG/2ML IJ SOLN
4.0000 mg | Freq: Once | INTRAMUSCULAR | Status: AC
  Administered 2012-07-25: 4 mg via INTRAVENOUS
  Filled 2012-07-25: qty 2

## 2012-07-25 MED ORDER — ONDANSETRON HCL 4 MG/2ML IJ SOLN: 4.0000 mg | Freq: Once | INTRAMUSCULAR | Status: DC

## 2012-07-25 MED ORDER — HYDROCODONE-ACETAMINOPHEN 5-325 MG PO TABS: 1.0000 | ORAL_TABLET | Freq: Four times a day (QID) | ORAL | Status: DC | PRN

## 2012-07-25 MED ORDER — PROMETHAZINE HCL 25 MG RE SUPP: 25.0000 mg | Freq: Four times a day (QID) | RECTAL | Status: DC | PRN

## 2012-07-25 MED ORDER — HYDROMORPHONE HCL PF 1 MG/ML IJ SOLN
1.0000 mg | Freq: Once | INTRAMUSCULAR | Status: AC
  Administered 2012-07-25: 1 mg via INTRAVENOUS
  Filled 2012-07-25: qty 1

## 2012-07-25 MED ORDER — ONDANSETRON 8 MG PO TBDP: 8.0000 mg | ORAL_TABLET | Freq: Three times a day (TID) | ORAL | Status: DC | PRN

## 2012-07-25 MED ORDER — SODIUM CHLORIDE 0.9 % IV BOLUS (SEPSIS): 1000.0000 mL | Freq: Once | INTRAVENOUS | Status: DC

## 2012-07-25 MED ORDER — IOHEXOL 300 MG/ML  SOLN
50.0000 mL | Freq: Once | INTRAMUSCULAR | Status: AC | PRN
  Administered 2012-07-25: 50 mL via ORAL

## 2012-07-25 MED ORDER — IOHEXOL 300 MG/ML  SOLN
100.0000 mL | Freq: Once | INTRAMUSCULAR | Status: AC | PRN
  Administered 2012-07-25: 100 mL via INTRAVENOUS

## 2012-07-25 NOTE — ED Notes (Signed)
Pt states he does not want labs drawn until he has an IV placed in the back.

## 2012-07-25 NOTE — ED Notes (Signed)
CT called; pt completed contrast 

## 2012-07-25 NOTE — ED Notes (Signed)
Pt reports epigastric abd pain that started a couple of days ago. Reports n/v and "unable to keep anything down". Denies trying any OTC medications for symptoms. Pt slightly diaphoretic at triage. Hx of being seen here for the same.

## 2012-07-25 NOTE — ED Provider Notes (Signed)
History     CSN: [id number]  Arrival date & time 07/25/12  1108   First MD Initiated Contact with Patient 07/25/12 1152      Chief Complaint  Patient presents with  . Abdominal Pain    (Consider location/radiation/quality/duration/timing/severity/associated sxs/prior treatment) Patient is a 62 y.o. male presenting with abdominal pain. The history is provided by the patient and medical records. No language interpreter was used.  Abdominal Pain Pain location:  Periumbilical Pain quality: gnawing and sharp   Pain radiates to:  Does not radiate Pain severity:  Moderate Onset quality:  Gradual Duration:  3 days Timing:  Constant Progression:  Worsening Chronicity:  Recurrent Context: not alcohol use and not previous surgeries   Relieved by:  Nothing Worsened by:  Nothing tried Ineffective treatments:  None tried Associated symptoms: chills, fever, flatus, nausea and vomiting   Associated symptoms: no belching, no chest pain, no constipation, no cough, no diarrhea, no dysuria, no fatigue, no hematemesis, no hematochezia, no hematuria, no melena and no shortness of breath     Ryan [name] is a 62 y.o. male  with a hx of DM, HTN, DVT, CAD, SBO, GERD, CHF, MI, PTSD presents to the Emergency Department complaining of gradual, persistent, progressively worsening abdominal pain onset 2-3 days ago.  Patient reports ongoing problem for the last 6-8 months and has required multiple admissions into the hospital; with one in January and another in February 2014. The last admission was for a ?SBO that resolved. Pt states pain is located in the midsection of his abd, rated at 10/10, non radiating, decribed as a "stabbing and scraping" feeling.  Pt states the feeling is the same as previous episodes, but the intensity is worse today. Associated symptoms include nausea, vomiting x3 today with medication administration, chills, fever to 101 at home, diaphoresis.  Nothing makes it better and nothing  makes it worse.  Pt denies chest pain, SOB, diarrhea, constipation, melena, weakness, dizziness, dysuria, hematuria, syncope.        Past Medical History  Diagnosis Date  . Hypertension   . DVT (deep venous thrombosis) ~ 2011    BLE  . 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 Hyperlipidemia   . CAD (coronary atherosclerotic disease)     s/p stents x2  . Small bowel obstruction   . Pancreatitis   . Anxiety   . GERD (gastroesophageal reflux disease)   . CHF (congestive heart failure)   . Myocardial infarction ~ 2009  . Anginal pain   . Pneumonia ~ 2012  . Shortness of breath     "lying down & w/exertion" (03/04/2012)  . Type II diabetes mellitus   . Hep B w/o coma 1970's    "I was in [place]" (03/04/2012)  . Chronic lower back pain   . PTSD (post-traumatic stress disorder)     Past Surgical History  Procedure Laterality Date  . Esophagogastroduodenoscopy  09/22/2011    Procedure: ESOPHAGOGASTRODUODENOSCOPY (EGD);  Surgeon: Charna Elizabeth, MD;  Location: [facility] ENDOSCOPY;  Service: Endoscopy;  Laterality: N/A;  . Esophagogastroduodenoscopy  01/05/2012    Procedure: ESOPHAGOGASTRODUODENOSCOPY (EGD);  Surgeon: Rachael Fee, MD;  Location: [facility] ENDOSCOPY;  Service: Endoscopy;  Laterality: N/A;  . Cholecystectomy  ~ 2009  . Spinal growth rods  ~ 2011  . Tonsillectomy and adenoidectomy    . Coronary angioplasty with stent placement  ~ 2009`    "1 + ! (after 1st one failed)" (03/04/2012)  Family History  Problem Relation Age of Onset  . Diabetes Father   . Colon cancer Neg Hx     History  Substance Use Topics  . Smoking status: Current Every Day Smoker -- 0.50 packs/day for 38 years    Types: Cigarettes  . Smokeless tobacco: Never Used  . 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 Negative for  diaphoresis, appetite change, fatigue and unexpected weight change.  HENT: Negative for mouth sores, trouble swallowing, neck pain and neck stiffness.   Respiratory: Negative for cough, chest tightness, shortness of breath, wheezing and stridor.   Cardiovascular: Negative for chest pain and palpitations.  Gastrointestinal: Positive for nausea, vomiting, abdominal pain and flatus. Negative for diarrhea, constipation, blood in stool, melena, hematochezia, abdominal distention, rectal pain and hematemesis.  Genitourinary: Negative for dysuria, urgency, frequency, hematuria, flank pain and difficulty urinating.  Musculoskeletal: Negative for back pain.  Skin: Negative for rash.  Neurological: Negative for weakness.  Hematological: Negative for adenopathy.  Psychiatric/Behavioral: Negative for confusion.  All other systems reviewed and are negative.    Allergies  Lisinopril and Morphine and related  Home Medications   Current Outpatient Rx  Name  Route  Sig  Dispense  Refill  . ALPRAZolam (XANAX) 1 MG tablet   Oral   Take 1 tablet (1 mg total) by mouth 2 (two) times daily.   16 tablet   0   . aspirin EC 81 MG tablet   Oral   Take 81 mg by mouth daily.         . DULoxetine (CYMBALTA) 30 MG capsule   Oral   Take 1 capsule (30 mg total) by mouth daily.         Marland Kitchen gabapentin (NEURONTIN) 400 MG capsule   Oral   Take 1 capsule (400 mg total) by mouth 3 (three) times daily.   30 capsule   0   . glipiZIDE (GLUCOTROL XL) 10 MG 24 hr tablet   Oral   Take 10 mg by mouth 2 (two) times daily.         . hyoscyamine (LEVSIN SL) 0.125 MG SL tablet   Sublingual   Place 1 tablet (0.125 mg total) under the tongue every 6 (six) hours as needed for cramping.   30 tablet      . insulin glargine (LANTUS) 100 UNIT/ML injection   Subcutaneous   Inject 10 Units into the skin at bedtime.         . insulin glulisine (APIDRA) 100 UNIT/ML injection   Subcutaneous   Inject 5 Units into  the skin 2 (two) times daily. Patient uses twice daily during lunch and dinner .         . lactulose (CHRONULAC) 10 GM/15ML solution   Oral   Take 30 mLs (20 g total) by mouth 2 (two) times daily.   960 mL   6   . losartan (COZAAR) 100 MG tablet   Oral   Take 100 mg by mouth daily.         . metFORMIN (GLUCOPHAGE) 850 MG tablet   Oral   Take 850 mg by mouth 3 (three) times daily.         Marland Kitchen nystatin (MYCOSTATIN) 100000 UNIT/ML suspension   Oral   Take 5  mLs (500,000 Units total) by mouth 4 (four) times daily. For 4 more days.   60 mL      . oxycodone (ROXICODONE) 30 MG immediate release tablet   Oral   Take 30 mg by mouth every 4 (four) hours as needed for pain. For breakthrough pain         . OxyCODONE HCl ER 60 MG T12A   Oral   Take 1 tablet by mouth 3 (three) times daily.         . pantoprazole (PROTONIX) 40 MG tablet   Oral   Take 1 tablet (40 mg total) by mouth daily.   30 tablet   6   . sennosides-docusate sodium (SENOKOT-S) 8.6-50 MG tablet   Oral   Take 2 tablets by mouth daily. For constipation   60 tablet   0   . traZODone (DESYREL) 100 MG tablet   Oral   Take 100 mg by mouth at bedtime.           BP 144/88  Pulse 76  Temp(Src) 99 F (37.2 C) (Oral)  Resp 13  SpO2 90%  Physical Exam  Nursing note and vitals reviewed. Constitutional: He appears well-developed and well-nourished.  HENT:  Head: Normocephalic and atraumatic.  Mouth/Throat: Oropharynx is clear and moist.  Eyes: Conjunctivae are normal. Pupils are equal, round, and reactive to light. No scleral icterus.  Neck: Normal range of motion.  Cardiovascular: Normal rate, regular rhythm, normal heart sounds and intact distal pulses.  Exam reveals no gallop and no friction rub.   No murmur heard. Pulmonary/Chest: Effort normal and breath sounds normal. No respiratory distress. He has no wheezes. He has no rales. He exhibits no tenderness.  Abdominal: Soft. Normal appearance and  bowel sounds are normal. He exhibits no distension and no mass. There is tenderness. There is guarding. There is no rebound.  Musculoskeletal: He exhibits no tenderness.  Lymphadenopathy:    He has no cervical adenopathy.  Neurological: He is alert. He exhibits normal muscle tone. Coordination normal.  Skin: Skin is warm and dry. No erythema.  Psychiatric: He has a normal mood and affect.    ED Course  Procedures (including critical care time)  Labs Reviewed  CBC WITH DIFFERENTIAL - Abnormal; Notable for the following:    Monocytes Relative 14 (*)    All other components within normal limits  BASIC METABOLIC PANEL - Abnormal; Notable for the following:    Potassium 3.4 (*)    Glucose, Bld 183 (*)    All other components within normal limits  URINALYSIS, ROUTINE W REFLEX MICROSCOPIC - Abnormal; Notable for the following:    Color, Urine AMBER (*)    Bilirubin Urine SMALL (*)    Ketones, ur 15 (*)    Protein, ur 30 (*)    Urobilinogen, UA 2.0 (*)    Leukocytes, UA SMALL (*)    All other components within normal limits  GLUCOSE, CAPILLARY - Abnormal; Notable for the following:    Glucose-Capillary 157 (*)    All other components within normal limits  LIPASE, BLOOD  URINE MICROSCOPIC-ADD ON   No results found.   No diagnosis found.    MDM  Carmin Richmond presents with acute on chronic abdominal pain. Glucose 157, patient with mild hypokalemia at 3.4 lipase within normal limits CBC unremarkable.  UA without significant evidence of urinary tract infection. Patient is nontoxic, nonseptic appearing. Patient's pain and other symptoms adequately managed in emergency department;  Patient has not  vomited while here in the department..  Fluid bolus given.  Labs and vitals reviewed.  Patient does not meet the SIRS or Sepsis criteria.    Patient with questionable history of small bowel obstruction.  CT abdomen pelvis pending.  Pt has been discussed with Dr Derwood Kaplan and he will  follow and dispo.          Dahlia Client Adiel Mcnamara, PA-C 07/25/12 1650

## 2012-07-27 ENCOUNTER — Ambulatory Visit
Admission: RE | Admit: 2012-07-27 | Discharge: 2012-07-27 | Disposition: A | Discharge status: Home / Self Care | Payer: Medicare Other | Source: Ambulatory Visit | Attending: Pain Medicine | Admitting: Pain Medicine

## 2012-07-27 DIAGNOSIS — M545 Low back pain: Secondary | ICD-10-CM

## 2012-07-27 MED ORDER — GADOBENATE DIMEGLUMINE 529 MG/ML IV SOLN
20.0000 mL | Freq: Once | INTRAVENOUS | Status: AC | PRN
  Administered 2012-07-27: 20 mL via INTRAVENOUS

## 2012-07-27 NOTE — ED Provider Notes (Signed)
Medical screening examination/treatment/procedure(s) were performed by non-physician practitioner and as supervising physician I was immediately available for consultation/collaboration.  Tarvis Blossom T Seiji Wiswell, MD 07/27/12 1957 

## 2012-07-28 DIAGNOSIS — G9340 Encephalopathy, unspecified: Secondary | ICD-10-CM

## 2012-07-28 HISTORY — DX: Encephalopathy, unspecified: G93.40

## 2012-07-30 NOTE — Telephone Encounter (Signed)
PT NOT BILLED SAME DAY CX FEE-WEATHER

## 2012-08-14 ENCOUNTER — Emergency Department (HOSPITAL_COMMUNITY): Payer: Medicare Other

## 2012-08-14 ENCOUNTER — Inpatient Hospital Stay (HOSPITAL_COMMUNITY): Payer: Medicare Other

## 2012-08-14 ENCOUNTER — Encounter (HOSPITAL_COMMUNITY): Payer: Self-pay

## 2012-08-14 ENCOUNTER — Inpatient Hospital Stay (HOSPITAL_COMMUNITY)
Admission: EM | Admit: 2012-08-14 | Discharge: 2012-08-19 | DRG: 871 | Disposition: A | Discharge status: Skilled Nursing Facility | Payer: Medicare Other | Attending: Internal Medicine | Admitting: Internal Medicine

## 2012-08-14 ENCOUNTER — Other Ambulatory Visit: Payer: Self-pay

## 2012-08-14 DIAGNOSIS — E119 Type 2 diabetes mellitus without complications: Secondary | ICD-10-CM

## 2012-08-14 DIAGNOSIS — Z86718 Personal history of other venous thrombosis and embolism: Secondary | ICD-10-CM

## 2012-08-14 DIAGNOSIS — R6520 Severe sepsis without septic shock: Secondary | ICD-10-CM

## 2012-08-14 DIAGNOSIS — I10 Essential (primary) hypertension: Secondary | ICD-10-CM

## 2012-08-14 DIAGNOSIS — F1920 Other psychoactive substance dependence, uncomplicated: Secondary | ICD-10-CM | POA: Diagnosis present

## 2012-08-14 DIAGNOSIS — J9690 Respiratory failure, unspecified, unspecified whether with hypoxia or hypercapnia: Secondary | ICD-10-CM

## 2012-08-14 DIAGNOSIS — F419 Anxiety disorder, unspecified: Secondary | ICD-10-CM

## 2012-08-14 DIAGNOSIS — G9349 Other encephalopathy: Secondary | ICD-10-CM | POA: Diagnosis present

## 2012-08-14 DIAGNOSIS — E876 Hypokalemia: Secondary | ICD-10-CM | POA: Diagnosis present

## 2012-08-14 DIAGNOSIS — G8929 Other chronic pain: Secondary | ICD-10-CM

## 2012-08-14 DIAGNOSIS — M6282 Rhabdomyolysis: Secondary | ICD-10-CM

## 2012-08-14 DIAGNOSIS — G9340 Encephalopathy, unspecified: Secondary | ICD-10-CM

## 2012-08-14 DIAGNOSIS — Z8719 Personal history of other diseases of the digestive system: Secondary | ICD-10-CM

## 2012-08-14 DIAGNOSIS — F3289 Other specified depressive episodes: Secondary | ICD-10-CM | POA: Diagnosis present

## 2012-08-14 DIAGNOSIS — J9600 Acute respiratory failure, unspecified whether with hypoxia or hypercapnia: Secondary | ICD-10-CM

## 2012-08-14 DIAGNOSIS — Y92009 Unspecified place in unspecified non-institutional (private) residence as the place of occurrence of the external cause: Secondary | ICD-10-CM

## 2012-08-14 DIAGNOSIS — Z91199 Patient's noncompliance with other medical treatment and regimen due to unspecified reason: Secondary | ICD-10-CM

## 2012-08-14 DIAGNOSIS — F411 Generalized anxiety disorder: Secondary | ICD-10-CM | POA: Diagnosis present

## 2012-08-14 DIAGNOSIS — M48061 Spinal stenosis, lumbar region without neurogenic claudication: Secondary | ICD-10-CM

## 2012-08-14 DIAGNOSIS — F17200 Nicotine dependence, unspecified, uncomplicated: Secondary | ICD-10-CM | POA: Diagnosis present

## 2012-08-14 DIAGNOSIS — J811 Chronic pulmonary edema: Secondary | ICD-10-CM

## 2012-08-14 DIAGNOSIS — I2510 Atherosclerotic heart disease of native coronary artery without angina pectoris: Secondary | ICD-10-CM

## 2012-08-14 DIAGNOSIS — N390 Urinary tract infection, site not specified: Secondary | ICD-10-CM

## 2012-08-14 DIAGNOSIS — W010XXA Fall on same level from slipping, tripping and stumbling without subsequent striking against object, initial encounter: Secondary | ICD-10-CM | POA: Diagnosis present

## 2012-08-14 DIAGNOSIS — R339 Retention of urine, unspecified: Secondary | ICD-10-CM

## 2012-08-14 DIAGNOSIS — J189 Pneumonia, unspecified organism: Secondary | ICD-10-CM | POA: Diagnosis present

## 2012-08-14 DIAGNOSIS — A419 Sepsis, unspecified organism: Principal | ICD-10-CM | POA: Diagnosis present

## 2012-08-14 DIAGNOSIS — Z9119 Patient's noncompliance with other medical treatment and regimen: Secondary | ICD-10-CM

## 2012-08-14 DIAGNOSIS — Z6841 Body Mass Index (BMI) 40.0 and over, adult: Secondary | ICD-10-CM

## 2012-08-14 DIAGNOSIS — F329 Major depressive disorder, single episode, unspecified: Secondary | ICD-10-CM

## 2012-08-14 DIAGNOSIS — M545 Low back pain, unspecified: Secondary | ICD-10-CM

## 2012-08-14 DIAGNOSIS — R918 Other nonspecific abnormal finding of lung field: Secondary | ICD-10-CM

## 2012-08-14 DIAGNOSIS — Z794 Long term (current) use of insulin: Secondary | ICD-10-CM

## 2012-08-14 DIAGNOSIS — K219 Gastro-esophageal reflux disease without esophagitis: Secondary | ICD-10-CM

## 2012-08-14 DIAGNOSIS — I440 Atrioventricular block, first degree: Secondary | ICD-10-CM | POA: Diagnosis present

## 2012-08-14 DIAGNOSIS — F1120 Opioid dependence, uncomplicated: Secondary | ICD-10-CM | POA: Diagnosis present

## 2012-08-14 LAB — COMPREHENSIVE METABOLIC PANEL
ALT: 44 U/L (ref 0–53)
AST: 150 U/L — ABNORMAL HIGH (ref 0–37)
Albumin: 3.1 g/dL — ABNORMAL LOW (ref 3.5–5.2)
Alkaline Phosphatase: 196 U/L — ABNORMAL HIGH (ref 39–117)
BUN: 12 mg/dL (ref 6–23)
CO2: 22 mEq/L (ref 19–32)
Calcium: 9.4 mg/dL (ref 8.4–10.5)
Chloride: 98 mEq/L (ref 96–112)
Creatinine, Ser: 0.83 mg/dL (ref 0.50–1.35)
GFR calc Af Amer: >90 mL/min (ref >90)
GFR calc non Af Amer: >90 mL/min (ref >90)
Glucose, Bld: 272 mg/dL — ABNORMAL HIGH (ref 70–99)
Potassium: 4.9 mEq/L (ref 3.5–5.1)
Sodium: 134 mEq/L — ABNORMAL LOW (ref 135–145)
Total Bilirubin: 0.2 mg/dL — ABNORMAL LOW (ref 0.3–1.2)
Total Protein: 7.4 g/dL (ref 6.0–8.3)

## 2012-08-14 LAB — CBC WITH DIFFERENTIAL/PLATELET
Basophils Absolute: 0 10*3/uL (ref 0.0–0.1)
Basophils Relative: 0 % (ref 0–1)
Eosinophils Absolute: 0 10*3/uL (ref 0.0–0.7)
Eosinophils Relative: 0 % (ref 0–5)
HCT: 50.4 % (ref 39.0–52.0)
Hemoglobin: 16.7 g/dL (ref 13.0–17.0)
Lymphocytes Relative: 8 % — ABNORMAL LOW (ref 12–46)
Lymphs Abs: 1 10*3/uL (ref 0.7–4.0)
MCH: 31.6 pg (ref 26.0–34.0)
MCHC: 33.1 g/dL (ref 30.0–36.0)
MCV: 95.5 fL (ref 78.0–100.0)
Monocytes Absolute: 0.7 10*3/uL (ref 0.1–1.0)
Monocytes Relative: 6 % (ref 3–12)
Neutro Abs: 10.2 10*3/uL — ABNORMAL HIGH (ref 1.7–7.7)
Neutrophils Relative %: 86 % — ABNORMAL HIGH (ref 43–77)
Platelets: 196 10*3/uL (ref 150–400)
RBC: 5.28 MIL/uL (ref 4.22–5.81)
RDW: 13.5 % (ref 11.5–15.5)
WBC: 11.9 10*3/uL — ABNORMAL HIGH (ref 4.0–10.5)

## 2012-08-14 LAB — CG4 I-STAT (LACTIC ACID): Lactic Acid, Venous: 3.84 mmol/L — ABNORMAL HIGH (ref 0.5–2.2)

## 2012-08-14 LAB — URINALYSIS, ROUTINE W REFLEX MICROSCOPIC
Glucose, UA: >1000 mg/dL — AB
Ketones, ur: NEGATIVE mg/dL
Nitrite: POSITIVE — AB
Protein, ur: 100 mg/dL — AB
Specific Gravity, Urine: 1.031 — ABNORMAL HIGH (ref 1.005–1.030)
Urobilinogen, UA: 1 mg/dL (ref 0.0–1.0)
pH: 5 (ref 5.0–8.0)

## 2012-08-14 LAB — BLOOD GAS, ARTERIAL
Acid-Base Excess: 1.5 mmol/L (ref 0.0–2.0)
Acid-base deficit: 1.3 mmol/L (ref 0.0–2.0)
Bicarbonate: 25.2 mEq/L — ABNORMAL HIGH (ref 20.0–24.0)
Bicarbonate: 25.8 mEq/L — ABNORMAL HIGH (ref 20.0–24.0)
Drawn by: 331471
Drawn by: 331471
FIO2: 1 %
FIO2: 1 %
MECHVT: 620 mL
O2 Saturation: 98.7 %
O2 Saturation: 92.6 %
PEEP: 5 cmH2O
Patient temperature: 98.6
Patient temperature: 98.6
RATE: 19 resp/min
TCO2: 22.1 mmol/L (ref 0–100)
TCO2: 22.3 mmol/L (ref 0–100)
pCO2 arterial: 50.9 mmHg — ABNORMAL HIGH (ref 35.0–45.0)
pCO2 arterial: 41.8 mmHg (ref 35.0–45.0)
pH, Arterial: 7.315 — ABNORMAL LOW (ref 7.350–7.450)
pH, Arterial: 7.408 (ref 7.350–7.450)
pO2, Arterial: 160 mmHg — ABNORMAL HIGH (ref 80.0–100.0)
pO2, Arterial: 65.9 mmHg — ABNORMAL LOW (ref 80.0–100.0)

## 2012-08-14 LAB — TROPONIN I: Troponin I: <0.3 ng/mL (ref <0.30)

## 2012-08-14 LAB — RAPID URINE DRUG SCREEN, HOSP PERFORMED
Amphetamines: NOT DETECTED
Barbiturates: NOT DETECTED
Benzodiazepines: POSITIVE — AB
Cocaine: NOT DETECTED
Opiates: NOT DETECTED
Tetrahydrocannabinol: NOT DETECTED

## 2012-08-14 LAB — URINE MICROSCOPIC-ADD ON

## 2012-08-14 LAB — SALICYLATE LEVEL: Salicylate Lvl: <2 mg/dL — ABNORMAL LOW (ref 2.8–20.0)

## 2012-08-14 LAB — ACETAMINOPHEN LEVEL: Acetaminophen (Tylenol), Serum: <15 ug/mL (ref 10–30)

## 2012-08-14 LAB — CK: Total CK: 13369 U/L — ABNORMAL HIGH (ref 7–232)

## 2012-08-14 LAB — GLUCOSE, CAPILLARY: Glucose-Capillary: 252 mg/dL — ABNORMAL HIGH (ref 70–99)

## 2012-08-14 LAB — ETHANOL: Alcohol, Ethyl (B): <11 mg/dL (ref 0–11)

## 2012-08-14 LAB — MRSA PCR SCREENING: MRSA by PCR: NEGATIVE

## 2012-08-14 LAB — AMMONIA: Ammonia: 39 umol/L (ref 11–60)

## 2012-08-14 MED ORDER — FENTANYL CITRATE 0.05 MG/ML IJ SOLN: 150.0000 ug | Freq: Once | INTRAMUSCULAR | Status: AC

## 2012-08-14 MED ORDER — NALOXONE HCL 0.4 MG/ML IJ SOLN: 0.4000 mg | INTRAMUSCULAR | Status: DC | PRN

## 2012-08-14 MED ORDER — LORAZEPAM 2 MG/ML IJ SOLN
INTRAMUSCULAR | Status: AC
  Administered 2012-08-14: 2 mg via INTRAVENOUS
  Filled 2012-08-14: qty 1

## 2012-08-14 MED ORDER — VANCOMYCIN HCL IN DEXTROSE 1-5 GM/200ML-% IV SOLN
1000.0000 mg | Freq: Once | INTRAVENOUS | Status: AC
  Administered 2012-08-14: 1000 mg via INTRAVENOUS
  Filled 2012-08-14: qty 200

## 2012-08-14 MED ORDER — FAMOTIDINE IN NACL 20-0.9 MG/50ML-% IV SOLN
20.0000 mg | Freq: Two times a day (BID) | INTRAVENOUS | Status: DC
  Administered 2012-08-14 – 2012-08-15 (×2): 20 mg via INTRAVENOUS
  Filled 2012-08-14 (×2): qty 50

## 2012-08-14 MED ORDER — SODIUM CHLORIDE 0.9 % IV BOLUS (SEPSIS)
1000.0000 mL | Freq: Once | INTRAVENOUS | Status: AC
  Administered 2012-08-14: 1000 mL via INTRAVENOUS

## 2012-08-14 MED ORDER — ROCURONIUM BROMIDE 50 MG/5ML IV SOLN
INTRAVENOUS | Status: AC
  Administered 2012-08-14: 80 mg
  Filled 2012-08-14: qty 2

## 2012-08-14 MED ORDER — FENTANYL CITRATE 0.05 MG/ML IJ SOLN
INTRAMUSCULAR | Status: AC
  Administered 2012-08-14: 150 ug via INTRAVENOUS
  Filled 2012-08-14: qty 4

## 2012-08-14 MED ORDER — SODIUM CHLORIDE 0.9 % IV SOLN
Freq: Once | INTRAVENOUS | Status: AC
  Administered 2012-08-14: 10:00:00 via INTRAVENOUS

## 2012-08-14 MED ORDER — ACETAMINOPHEN 650 MG RE SUPP
650.0000 mg | Freq: Once | RECTAL | Status: AC
  Administered 2012-08-14: 650 mg via RECTAL
  Filled 2012-08-14: qty 1

## 2012-08-14 MED ORDER — LORAZEPAM 2 MG/ML IJ SOLN
INTRAMUSCULAR | Status: AC | PRN
  Administered 2012-08-14: 2 mg via INTRAVENOUS

## 2012-08-14 MED ORDER — LORAZEPAM 2 MG/ML IJ SOLN: 2.0000 mg | Freq: Once | INTRAMUSCULAR | Status: AC

## 2012-08-14 MED ORDER — NALOXONE HCL 0.4 MG/ML IJ SOLN
INTRAMUSCULAR | Status: AC
  Administered 2012-08-14: 0.4 mg
  Filled 2012-08-14: qty 1

## 2012-08-14 MED ORDER — ETOMIDATE 2 MG/ML IV SOLN
INTRAVENOUS | Status: AC
  Administered 2012-08-14: 30 mg
  Filled 2012-08-14: qty 20

## 2012-08-14 MED ORDER — PROPOFOL 10 MG/ML IV EMUL: 5.0000 ug/kg/min | Freq: Once | INTRAVENOUS | Status: AC

## 2012-08-14 MED ORDER — INSULIN ASPART 100 UNIT/ML ~~LOC~~ SOLN
0.0000 [IU] | SUBCUTANEOUS | Status: DC
  Administered 2012-08-14: 5 [IU] via SUBCUTANEOUS
  Administered 2012-08-14: 8 [IU] via SUBCUTANEOUS
  Administered 2012-08-15 (×2): 5 [IU] via SUBCUTANEOUS
  Administered 2012-08-15: 8 [IU] via SUBCUTANEOUS
  Administered 2012-08-15 (×2): 5 [IU] via SUBCUTANEOUS
  Administered 2012-08-15 – 2012-08-16 (×4): 3 [IU] via SUBCUTANEOUS

## 2012-08-14 MED ORDER — PROPOFOL 10 MG/ML IV EMUL
INTRAVENOUS | Status: AC
  Administered 2012-08-14: 1000 mg via INTRAVENOUS
  Filled 2012-08-14: qty 100

## 2012-08-14 MED ORDER — SUCCINYLCHOLINE CHLORIDE 20 MG/ML IJ SOLN
INTRAMUSCULAR | Status: AC
  Filled 2012-08-14: qty 5

## 2012-08-14 MED ORDER — PROPOFOL 10 MG/ML IV EMUL
5.0000 ug/kg/min | INTRAVENOUS | Status: DC
  Administered 2012-08-15 (×2): 35 ug/kg/min via INTRAVENOUS
  Administered 2012-08-15: 30 ug/kg/min via INTRAVENOUS

## 2012-08-14 MED ORDER — PROPOFOL 10 MG/ML IV BOLUS
INTRAVENOUS | Status: DC | PRN
  Administered 2012-08-14: 10 mg via INTRAVENOUS

## 2012-08-14 MED ORDER — ROCURONIUM BROMIDE 50 MG/5ML IV SOLN
INTRAVENOUS | Status: AC | PRN
  Administered 2012-08-14: 80 mg via INTRAVENOUS

## 2012-08-14 MED ORDER — NALOXONE HCL 1 MG/ML IJ SOLN
INTRAMUSCULAR | Status: AC
  Administered 2012-08-14: 1 mg
  Filled 2012-08-14: qty 2

## 2012-08-14 MED ORDER — VANCOMYCIN HCL 10 G IV SOLR
1500.0000 mg | Freq: Once | INTRAVENOUS | Status: AC
  Administered 2012-08-14: 1500 mg via INTRAVENOUS
  Filled 2012-08-14: qty 1500

## 2012-08-14 MED ORDER — FENTANYL CITRATE 0.05 MG/ML IJ SOLN
INTRAMUSCULAR | Status: AC | PRN
  Administered 2012-08-14: 150 ug via INTRAVENOUS

## 2012-08-14 MED ORDER — SODIUM CHLORIDE 0.9 % IV SOLN
250.0000 mL | INTRAVENOUS | Status: DC | PRN
  Administered 2012-08-16: 250 mL via INTRAVENOUS

## 2012-08-14 MED ORDER — VANCOMYCIN HCL 10 G IV SOLR
1500.0000 mg | Freq: Two times a day (BID) | INTRAVENOUS | Status: DC
  Administered 2012-08-15 – 2012-08-16 (×3): 1500 mg via INTRAVENOUS
  Filled 2012-08-14 (×3): qty 1500

## 2012-08-14 MED ORDER — ETOMIDATE 2 MG/ML IV SOLN
INTRAVENOUS | Status: AC | PRN
  Administered 2012-08-14: 30 mg via INTRAVENOUS

## 2012-08-14 MED ORDER — PIPERACILLIN-TAZOBACTAM 3.375 G IVPB 30 MIN
3.3750 g | Freq: Once | INTRAVENOUS | Status: AC
  Administered 2012-08-14: 3.375 g via INTRAVENOUS
  Filled 2012-08-14: qty 50

## 2012-08-14 MED ORDER — HEPARIN SODIUM (PORCINE) 5000 UNIT/ML IJ SOLN
5000.0000 [IU] | Freq: Three times a day (TID) | INTRAMUSCULAR | Status: DC
  Administered 2012-08-14 – 2012-08-16 (×5): 5000 [IU] via SUBCUTANEOUS
  Filled 2012-08-14 (×8): qty 1

## 2012-08-14 MED ORDER — FENTANYL CITRATE 0.05 MG/ML IJ SOLN: 50.0000 ug | INTRAMUSCULAR | Status: DC | PRN

## 2012-08-14 MED ORDER — PROPOFOL 10 MG/ML IV EMUL
5.0000 ug/kg/min | INTRAVENOUS | Status: DC
  Administered 2012-08-14: 25 ug/kg/min via INTRAVENOUS
  Administered 2012-08-14: 20 ug/kg/min via INTRAVENOUS
  Filled 2012-08-14 (×5): qty 100

## 2012-08-14 MED ORDER — DEXTROSE-NACL 5-0.45 % IV SOLN
INTRAVENOUS | Status: DC
  Administered 2012-08-14 – 2012-08-15 (×2): via INTRAVENOUS

## 2012-08-14 MED ORDER — LIDOCAINE HCL (PF) 1 % IJ SOLN: 30.0000 mL | Freq: Once | INTRAMUSCULAR | Status: DC

## 2012-08-14 MED ORDER — DEXTROSE 5 % IV SOLN
1.0000 g | Freq: Three times a day (TID) | INTRAVENOUS | Status: DC
  Administered 2012-08-14 – 2012-08-17 (×8): 1 g via INTRAVENOUS
  Filled 2012-08-14 (×9): qty 1

## 2012-08-14 MED ORDER — ASPIRIN 325 MG PO TABS
325.0000 mg | ORAL_TABLET | Freq: Every day | ORAL | Status: DC
  Administered 2012-08-14 – 2012-08-15 (×2): 325 mg
  Filled 2012-08-14 (×3): qty 1

## 2012-08-14 MED ORDER — LIDOCAINE HCL (CARDIAC) 20 MG/ML IV SOLN
INTRAVENOUS | Status: AC
  Filled 2012-08-14: qty 5

## 2012-08-14 NOTE — Progress Notes (Signed)
CARE MANAGEMENT NOTE 08/14/2012  Patient:  Ryan [name]   Account Number:  [id number]  Date Initiated:  08/14/2012  Documentation initiated by:  DAVIS,RHONDA  Subjective/Objective Assessment:   found unresponsive in shower at home, became responsive and then lethgaric again, intubated upon arrival to ed.     Action/Plan:   from home   Anticipated DC Date:  08/17/2012   Anticipated DC Plan:  HOME/SELF CARE         Choice offered to / List presented to:             Status of service:  In process, will continue to follow Medicare Important Message given?  NA - LOS <3 / Initial given by admissions (If response is "NO", the following Medicare IM given date fields will be blank) Date Medicare IM given:   Date Additional Medicare IM given:    Discharge Disposition:    Per UR Regulation:  Reviewed for med. necessity/level of care/duration of stay  If discussed at Long Length of Stay Meetings, dates discussed:    Comments:  [id number]/Rhonda Davis,RN,BSN,CCM

## 2012-08-14 NOTE — H&P (Signed)
PCCM NOTE  Date of admission: 4/18 Pt Profile:  49 M transported by EMS to [facility] ED after fall in shower. Became progressively confused and obtunded. Intubated for AMS. PCCM asked to admit to ICU   STUDIES/EVENTS OF NOTE: 4/18 CT head: NAD   LINES/TUBES: ETT 4/18 >>  L [place] CVL 4/18 >>   MICRO:  MRSA PCR 4/18 >> neg  ABX:  Urine 4/18 >>  Resp 4/18 >>  Blood 4/18 >>   PROPHYLAXIS:  DVT: SQ heparin  SUP: Famotidine  CONSULTANTS:    HPI: Level 5 Caveat Little history to go by here. He reportedly called EMS after a fall and had progressive obtundation ultimately resulting in intubation in the ED. No family available to offer further history.   Past Medical History  Diagnosis Date  . Hypertension   . DVT (deep venous thrombosis) ~ 2011    BLE  . 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 Hyperlipidemia   . CAD (coronary atherosclerotic disease)     s/p stents x2  . Small bowel obstruction   . Pancreatitis   . Anxiety   . GERD (gastroesophageal reflux disease)   . CHF (congestive heart failure)   . Myocardial infarction ~ 2009  . Anginal pain   . Pneumonia ~ 2012  . Shortness of breath     "lying down & w/exertion" (03/04/2012)  . Type II diabetes mellitus   . Hep B w/o coma 1970's    "I was in [place]" (03/04/2012)  . Chronic lower back pain   . PTSD (post-traumatic stress disorder)     MEDICATIONS: reviewed his bag of meds which include multiple bottles of oxycodone among other meds as documented  History   Social History  . Marital Status: Legally Separated    Spouse Name: N/A    Number of Children: 1  . Years of Education: N/A   Occupational History  . Disabled    Social History Main Topics  . Smoking status: Current Every Day Smoker -- 0.50 packs/day for 38 years    Types: Cigarettes  . Smokeless tobacco: Never Used  . 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 Drug Use: Yes    Special: Marijuana     Comment: 03/04/2012 "last marijuana ~ 15-20 yr ago"  . Sexually Active: Not Currently   Other Topics Concern  . Not on file   Social History Narrative  . No narrative on file    Family History  Problem Relation Age of Onset  . Diabetes Father   . Colon cancer Neg Hx     ROS Level 5 caveat  Filed Vitals:   08/14/12 1200 08/14/12 1523 08/14/12 1615 08/14/12 1623  BP: 172/78 189/89    Pulse: 99   94  Temp: 100.5 F (38.1 C) 98.7 F (37.1 C)    TempSrc:  Oral    Resp: 28 16  16   Height:   6' (1.829 m)   Weight:      SpO2: 98%   99%    EXAM:  Gen: disheveled, intubated, minimally responsive HEENT: [place]/AT. Pinpoint, symmetric pupils, EOMI Neck: No JVD noted, no LAN Lungs: clear anteriorly, no wheezes Cardiovascular: RRR s M Abdomen: obese, soft, NT, no masses Ext: warm, trace symmetric edema Neuro: withdraws all 4 ext   DATA: BMET  Component Value Date/Time   NA 134* 08/14/2012 1100   K 4.9 08/14/2012 1100   CL 98 08/14/2012 1100   CO2 22 08/14/2012 1100   GLUCOSE 272* 08/14/2012 1100   BUN 12 08/14/2012 1100   CREATININE 0.83 08/14/2012 1100   CALCIUM 9.4 08/14/2012 1100   GFRNONAA >90 08/14/2012 1100   GFRAA >90 08/14/2012 1100    CBC    Component Value Date/Time   WBC 11.9* 08/14/2012 1100   RBC 5.28 08/14/2012 1100   HGB 16.7 08/14/2012 1100   HCT 50.4 08/14/2012 1100   PLT 196 08/14/2012 1100   MCV 95.5 08/14/2012 1100   MCH 31.6 08/14/2012 1100   MCHC 33.1 08/14/2012 1100   RDW 13.5 08/14/2012 1100   LYMPHSABS 1.0 08/14/2012 1100   MONOABS 0.7 08/14/2012 1100   EOSABS 0.0 08/14/2012 1100   BASOSABS 0.0 08/14/2012 1100    Urinalysis    Component Value Date/Time   COLORURINE RED* 08/14/2012 1109   APPEARANCEUR TURBID* 08/14/2012 1109   LABSPEC 1.031* 08/14/2012 1109   PHURINE 5.0 08/14/2012 1109   GLUCOSEU >1000* 08/14/2012 1109   HGBUR LARGE* 08/14/2012 1109   BILIRUBINUR SMALL* 08/14/2012 1109    KETONESUR NEGATIVE 08/14/2012 1109   PROTEINUR 100* 08/14/2012 1109   UROBILINOGEN 1.0 08/14/2012 1109   NITRITE POSITIVE* 08/14/2012 1109   LEUKOCYTESUR MODERATE* 08/14/2012 1109      Acetaminophen < 15.0 Salicylate < 2.0 UDS + for benzo's only   CXR: edema vs ALI pattern    IMPRESSION:      Acute respiratory failure Encephalopathy acute, unclear etiology - TME likely. ? Sepsis vs medications Severe sepsis likely due to urinary tract infection Narcotic dependence Anxiety and depression DIABETES MELLITUS, TYPE II HYPERTENSION CORONARY ARTERY DISEASE History of DVT (deep vein thrombosis)    PLAN:  Vent settings established Vent bundle implemented DVT prophy and SUP as above  Sedation protocol - keep as light as possible to allow daily neuro assessment IVFs ordered Consider nutrition 4/19 SSI ordered Micro and empiric abx as above   35 mins CCM time   Billy Fischer, MD ; [facility] service Mobile [phone].  After 5:30 PM or weekends, call [phone]

## 2012-08-14 NOTE — ED Notes (Signed)
Lidocaine 1% given by Marylen Ponto, MD prior to IO placement.

## 2012-08-14 NOTE — ED Notes (Signed)
WUJ:[id number]<JY> Expected date:<BR> Expected time:<BR> Means of arrival:<BR> Comments:<BR> Fall-lethargic

## 2012-08-14 NOTE — ED Notes (Addendum)
Per EMS, Pt, from home, had unwitnessed fall in shower this AM.  Pt told EMS that he fell onto knees and was unable to get up.  Pt was A & Ox4 on seen and initally refused treatment.  Pt spontaneously opening eyes and speech is incomprehensible.  Pt became increasingly lethargic in route.  Per EMS, Pt sts he has been out of his thorazine x 1 week and does not sleep well without it.  Vitals are stable.

## 2012-08-14 NOTE — ED Notes (Signed)
Received report from Alaina Rn pt lethargic but responsive to pain.

## 2012-08-14 NOTE — ED Provider Notes (Signed)
History    61yM brought in by EMS. Called because pt fell at home. Unwitnessed. Pt lives with roommate who was on scene but, per EMS, he did not provide much useful history. Pt found in shower. Unknown how long there. Alert and talking on scene and initially refusing transfer for evaluation. When pt was unable to get up under his own power, EMS brought him in. Decreasing mental status en route. On arrival pt very somnolent. Inconsistently follows basic commands (open eyes, give name, squeeze hand, etc.). Unable to provide details as to current condition. Moaning occasionally as if in pain but doesn't verbally localize. Past hx of hypertension, hyperlipidemia, diabetes, anxiety/depression and chronic back pain.   CSN: [id number]  Arrival date & time 08/14/12  5784   First MD Initiated Contact with Patient 08/14/12 [phone]      Chief Complaint  Patient presents with  . Fall  . lethargic   . Generalized pain     (Consider location/radiation/quality/duration/timing/severity/associated sxs/prior treatment) HPI  Past Medical History  Diagnosis Date  . Hypertension   . DVT (deep venous thrombosis) ~ 2011    BLE  . 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 Hyperlipidemia   . CAD (coronary atherosclerotic disease)     s/p stents x2  . Small bowel obstruction   . Pancreatitis   . Anxiety   . GERD (gastroesophageal reflux disease)   . CHF (congestive heart failure)   . Myocardial infarction ~ 2009  . Anginal pain   . Pneumonia ~ 2012  . Shortness of breath     "lying down & w/exertion" (03/04/2012)  . Type II diabetes mellitus   . Hep B w/o coma 1970's    "I was in [place]" (03/04/2012)  . Chronic lower back pain   . PTSD (post-traumatic stress disorder)     Past Surgical History  Procedure Laterality Date  . Esophagogastroduodenoscopy  09/22/2011    Procedure: ESOPHAGOGASTRODUODENOSCOPY (EGD);  Surgeon: Charna Elizabeth, MD;  Location: [facility] ENDOSCOPY;  Service:  Endoscopy;  Laterality: N/A;  . Esophagogastroduodenoscopy  01/05/2012    Procedure: ESOPHAGOGASTRODUODENOSCOPY (EGD);  Surgeon: Rachael Fee, MD;  Location: [facility] ENDOSCOPY;  Service: Endoscopy;  Laterality: N/A;  . Cholecystectomy  ~ 2009  . Spinal growth rods  ~ 2011  . Tonsillectomy and adenoidectomy    . Coronary angioplasty with stent placement  ~ 2009`    "1 + ! (after 1st one failed)" (03/04/2012)    Family History  Problem Relation Age of Onset  . Diabetes Father   . Colon cancer Neg Hx     History  Substance Use Topics  . Smoking status: Current Every Day Smoker -- 0.50 packs/day for 38 years    Types: Cigarettes  . Smokeless tobacco: Never Used  . 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  Allergies  Lisinopril and Morphine and related  Home Medications   Current Outpatient Rx  Name  Route  Sig  Dispense  Refill  . ALPRAZolam (XANAX) 1 MG tablet   Oral   Take 1 tablet (1 mg total) by mouth 2 (two) times daily.   16 tablet   0   . aspirin EC 81 MG tablet   Oral   Take  81 mg by mouth daily.         . DULoxetine (CYMBALTA) 30 MG capsule   Oral   Take 1 capsule (30 mg total) by mouth daily.         Marland Kitchen gabapentin (NEURONTIN) 400 MG capsule   Oral   Take 1 capsule (400 mg total) by mouth 3 (three) times daily.   30 capsule   0   . glipiZIDE (GLUCOTROL XL) 10 MG 24 hr tablet   Oral   Take 10 mg by mouth 2 (two) times daily.         Marland Kitchen HYDROcodone-acetaminophen (NORCO/VICODIN) 5-325 MG per tablet   Oral   Take 1 tablet by mouth every 6 (six) hours as needed for pain.   15 tablet   0   . hyoscyamine (LEVSIN SL) 0.125 MG SL tablet   Sublingual   Place 1 tablet (0.125 mg total) under the tongue every 6 (six) hours as needed for cramping.   30 tablet      . insulin glargine  (LANTUS) 100 UNIT/ML injection   Subcutaneous   Inject 10 Units into the skin at bedtime.         . insulin glulisine (APIDRA) 100 UNIT/ML injection   Subcutaneous   Inject 5 Units into the skin 2 (two) times daily. Patient uses twice daily during lunch and dinner .         . lactulose (CHRONULAC) 10 GM/15ML solution   Oral   Take 30 mLs (20 g total) by mouth 2 (two) times daily.   960 mL   6   . losartan (COZAAR) 100 MG tablet   Oral   Take 100 mg by mouth daily.         . metFORMIN (GLUCOPHAGE) 850 MG tablet   Oral   Take 850 mg by mouth 3 (three) times daily.         Marland Kitchen nystatin (MYCOSTATIN) 100000 UNIT/ML suspension   Oral   Take 5 mLs (500,000 Units total) by mouth 4 (four) times daily. For 4 more days.   60 mL      . ondansetron (ZOFRAN ODT) 8 MG disintegrating tablet   Oral   Take 1 tablet (8 mg total) by mouth every 8 (eight) hours as needed for nausea.   20 tablet   0   . oxycodone (ROXICODONE) 30 MG immediate release tablet   Oral   Take 30 mg by mouth every 4 (four) hours as needed for pain. For breakthrough pain         . OxyCODONE HCl ER 60 MG T12A   Oral   Take 1 tablet by mouth 3 (three) times daily.         . pantoprazole (PROTONIX) 40 MG tablet   Oral   Take 1 tablet (40 mg total) by mouth daily.   30 tablet   6   . promethazine (PHENERGAN) 25 MG suppository   Rectal   Place 1 suppository (25 mg total) rectally every 6 (six) hours as needed for nausea.   12 each   0   . sennosides-docusate sodium (SENOKOT-S) 8.6-50 MG tablet   Oral   Take 2 tablets by mouth daily. For constipation   60 tablet   0   . traZODone (DESYREL) 100 MG tablet   Oral   Take 100 mg by mouth at bedtime.           BP 171/83  Pulse 109  Resp 24  Wt 330 lb (149.687 kg)  BMI 39.12 kg/m2  SpO2 100%  Physical Exam  Constitutional: He appears distressed.  Laying in bed. Obese. Poor personal hygiene.   HENT:  Head: Normocephalic and atraumatic.   Poor dentition  Eyes: No scleral icterus.  Pupils pinpoint  Neck: Neck supple.  Cardiovascular:  Mild tachycardia. Regular rhythm.   Pulmonary/Chest:  Decreased breath sounds b/l  Abdominal: Soft.  Musculoskeletal:  Apparent pain with ROM of either hip. Pelvis stable. No LE shortening. NVI distally. Abrasions/lesions consistent with prolonged pressure to b/l knees.   Neurological:  Very drowsy. Opens eyes to loud voice. Follows basic commands. No facial droop. Moving all extremities.   Skin: Skin is warm. He is diaphoretic.    ED Course  IO LINE INSERTION Date/Time: 08/14/2012 10:30 AM Performed by: Raeford Razor Authorized by: Raeford Razor Consent: The procedure was performed in an emergent situation. Patient identity confirmed: verbally with patient and provided demographic data Indications: fluid administration, medication administration and rapid vascular access Local anesthesia used: yes Anesthesia: local infiltration Local anesthetic: lidocaine 2% without epinephrine Patient sedated: no Insertion site: left proximal tibia Site preparation: chlorhexidine Insertion device: drill device Insertion: needle was inserted through the bony cortex Number of attempts: 1 Confirmation method: stability of the needle, aspiration of blood/marrow and easy infusion of fluids Secured with: gauze dressing Patient tolerance: Patient tolerated the procedure well with no immediate complications. Comments: . OG placement Date/Time: 08/14/2012 12:55 PM Performed by: Raeford Razor Authorized by: Raeford Razor Consent: The procedure was performed in an emergent situation. Patient identity confirmed: provided demographic data and arm band Local anesthesia used: no Patient sedated: yes Vitals: Vital signs were monitored during sedation. Patient tolerance: Patient tolerated the procedure well with no immediate complications. Comments: Pt previously sedated for  intubation  INTUBATION Date/Time: 08/14/2012 12:56 PM Performed by: Raeford Razor Authorized by: Raeford Razor Consent: The procedure was performed in an emergent situation. Patient identity confirmed: arm band and provided demographic data Indications: airway protection Intubation method: direct Patient status: paralyzed (RSI) Sedatives: etomidate Paralytic: rocuronium Laryngoscope size: Mac 4 Tube size: 7.5 mm Tube type: cuffed Number of attempts: 2 Cricoid pressure: yes Cords visualized: yes Post-procedure assessment: chest rise and ETCO2 monitor Breath sounds: reduced on left Cuff inflated: yes ETT to lip: 27 cm Tube secured with: ETT holder Chest x-ray interpreted by me. Chest x-ray findings: endotracheal tube too low Tube repositioned: tube repositioned successfully Patient tolerance: Patient tolerated the procedure well with no immediate complications. Comments: ETT right at carina. Instructed RT to retract 1-2 cm.     CRITICAL CARE Performed by: Raeford Razor   Total critical care time: 50 minutes  Critical care time was exclusive of separately billable procedures and treating other patients.  Critical care was necessary to treat or prevent imminent or life-threatening deterioration.  Critical care was time spent personally by me on the following activities: development of treatment plan with patient and/or surrogate as well as nursing, discussions with consultants, evaluation of patient's response to treatment, examination of patient, obtaining history from patient or surrogate, ordering and performing treatments and interventions, ordering and review of laboratory studies, ordering and review of radiographic studies, pulse oximetry and re-evaluation of patient's condition.  (including critical care time)    Labs Reviewed  COMPREHENSIVE METABOLIC PANEL - Abnormal; Notable for the following:    Sodium 134 (*)    Glucose, Bld 272 (*)    Albumin 3.1 (*)     AST 150 (*)  Alkaline Phosphatase 196 (*)    Total Bilirubin 0.2 (*)    All other components within normal limits  CBC WITH DIFFERENTIAL - Abnormal; Notable for the following:    WBC 11.9 (*)    Neutrophils Relative 86 (*)    Lymphocytes Relative 8 (*)    Neutro Abs 10.2 (*)    All other components within normal limits  BLOOD GAS, ARTERIAL - Abnormal; Notable for the following:    pH, Arterial 7.315 (*)    pCO2 arterial 50.9 (*)    pO2, Arterial 160.0 (*)    Bicarbonate 25.2 (*)    All other components within normal limits  URINALYSIS, ROUTINE W REFLEX MICROSCOPIC - Abnormal; Notable for the following:    Color, Urine RED (*)    APPearance TURBID (*)    Specific Gravity, Urine 1.031 (*)    Glucose, UA >1000 (*)    Hgb urine dipstick LARGE (*)    Bilirubin Urine SMALL (*)    Protein, ur 100 (*)    Nitrite POSITIVE (*)    Leukocytes, UA MODERATE (*)    All other components within normal limits  URINE RAPID DRUG SCREEN (HOSP PERFORMED) - Abnormal; Notable for the following:    Benzodiazepines POSITIVE (*)    All other components within normal limits  SALICYLATE LEVEL - Abnormal; Notable for the following:    Salicylate Lvl <2.0 (*)    All other components within normal limits  CK - Abnormal; Notable for the following:    Total CK 13369 (*)    All other components within normal limits  GLUCOSE, CAPILLARY - Abnormal; Notable for the following:    Glucose-Capillary 252 (*)    All other components within normal limits  URINE MICROSCOPIC-ADD ON - Abnormal; Notable for the following:    Squamous Epithelial / LPF FEW (*)    Bacteria, UA MANY (*)    All other components within normal limits  CG4 I-STAT (LACTIC ACID) - Abnormal; Notable for the following:    Lactic Acid, Venous 3.84 (*)    All other components within normal limits  CULTURE, BLOOD (ROUTINE X 2)  CULTURE, BLOOD (ROUTINE X 2)  URINE CULTURE  AMMONIA  TROPONIN I  ETHANOL  ACETAMINOPHEN LEVEL  BLOOD GAS,  ARTERIAL   Ct Head Wo Contrast  08/14/2012  *RADIOLOGY REPORT*  Clinical Data: Decreased mental status.  Recent fall.  CT HEAD WITHOUT CONTRAST  Technique:  Contiguous axial images were obtained from the base of the skull through the vertex without contrast.  Comparison: None.  Findings: There is mild low attenuation within the subcortical and periventricular white matter consistent with chronic small vessel ischemic change.  There is prominence of the sulci and ventricles consistent with brain atrophy.  There is no evidence for acute brain infarct, hemorrhage or mass.  Polyp versus retention cyst noted in the left maxillary sinus. Mild partial opacification of the sphenoid sinus and left sided ethmoid air cells noted.  The mastoid air cells are clear.  The skull is intact.  IMPRESSION:  1.  No acute intracranial abnormalities. 2.  Mild small vessel ischemic change and brain atrophy.   Original Report Authenticated By: Signa Kell, M.D.    Dg Pelvis Portable  08/14/2012  *RADIOLOGY REPORT*  Clinical Data: Fall.  PORTABLE PELVIS  Comparison: None.  Findings: Mild symmetric degenerative changes in the hips bilaterally.  Postsurgical changes of partially visualized in the lower lumbar spine. No acute bony abnormality.  Specifically, no fracture, subluxation, or  dislocation.  Soft tissues are intact.  IMPRESSION: No acute bony abnormality.   Original Report Authenticated By: Charlett Nose, M.D.    Dg Chest Portable 1 View  08/14/2012  *RADIOLOGY REPORT*  Clinical Data: Central line and endotracheal tube placement.  PORTABLE CHEST - 1 VIEW  Comparison: 06/01/2012 and 05/25/2012.  Findings: 1141 hours.  Examination was repeated at 12:19 and 1223 hours.  Endotracheal tube tip is in the mid trachea.  A nasogastric tube is looped in the gastric fundus.  No central venous catheter is identified.  On the initial image, there is left apical lucency. However, the subsequent views show this to be less evident.  No  pneumothorax is identified.  There are bilateral perihilar and left lower lobe air space opacities.  There is a possible small left pleural effusion.  The heart size and mediastinal contours are stable for the lesser degree of inspiration.  IMPRESSION:  1.  Endotracheal and nasogastric tubes are satisfactorily positioned.  There is no evidence of central venous catheter within the chest. 2.  Bilateral perihilar and left lower lobe air space opacities. Possible small left pleural effusion.   Original Report Authenticated By: Carey Bullocks, M.D.    EKG:  Rhythm: sinus tachycardia Rate: 108 Intervals:1st degree av block Inferior and anterior q waves ST segments: NS ST changes   1. Severe sepsis   2. Pulmonary edema   3. Rhabdomyolysis   4. Respiratory failure   5. UTI (urinary tract infection)       MDM  61yM with decreased mental status. Fall at home. Will CT head. Seems to be in pain with ROM of b/l hip. Will XR. On multiple sedating home meds. Pupils constricted on arrival. Pupillary response to narcan, but no change in mental status. EKG with new q waves inferiorly and also V3 as compared to previous from 04/2012. 1st degree AV block which is not new. Intervals otherwise ok. Febrile and elevated lactic acid. Empiric abx. Difficulty obtaining IV access necessitating IO line.    Pt with decreasing mental status and now not following commands. O2 sats remain good albeit on NRB. Now having frothy secretions. Intubated for airway protection.   UA consistent with UTI.  CXR with pulmonary edema vs infiltrate. Empirically ordered abx should cover.  CK elevated. IVF. Discussed with CCM for admit.        Raeford Razor, MD 08/14/12 [id number]

## 2012-08-14 NOTE — ED Notes (Signed)
7.5 intubation tube in at 27 at the lip. Xray called for confirmation.

## 2012-08-14 NOTE — Procedures (Signed)
PROCEDURE NOTE: [place] CVL PLACEMENT  INDICATION:    Monitoring of central venous pressures and/or administration of medications optimally administered in central vein  CONSENT:   Procedure performed with implied consent as there were no family members available. A time out was performed to review patient identification, procedure to be performed, correct patient position, medications/allergies/relevent history, required imaging and test results.  PROCEDURE  Maximum sterile technique was used including antiseptics, cap, gloves, gown, hand hygiene, mask and sheet.  Skin prep: Chlorhexidine; local anesthetic administered  A antimicrobial bonded/coated triple lumen catheter was placed in the [place] vein using the Seldinger technique.    EVALUATION:  Blood flow good  Complications: No apparent complications  Patient tolerated the procedure well.  Chest X-ray revealed adequate position and no ptx   Billy Fischer, MD PCCM service Mobile [phone]

## 2012-08-14 NOTE — Progress Notes (Addendum)
INITIAL NUTRITION ASSESSMENT  DOCUMENTATION CODES Per approved criteria  -Morbid Obesity   INTERVENTION: TF Recommendations: Initiate Oxepa @ 10 ml/hr via OG tube and increase by 10 ml every 4 hours to goal rate of 20 ml/hr. 30 ml Prostat 11 times per day.  At goal rate, tube feeding regimen will provide 1820 kcal, 195 grams of protein, and 377 ml of H2O.  This will meet 67% of estimated calorie needs and 97% of estimated protein needs.  Recommend daily Multivitamin with minerals due to TF being less than 1 L per 24 hours and therefore not providing adequate vitamins and minerals.  NUTRITION DIAGNOSIS: Inadequate oral intake related to inability to eat as evidenced by NPO status and pt on Vent  Goal: Enteral nutrition to provide 60-70% of estimated calorie needs (22-25 kcals/kg ideal body weight) and 100% of estimated protein needs, based on ASPEN guidelines for permissive underfeeding in critically ill obese individuals.   Monitor:  TF initiation/tolerance/rate Wt Vent Status Labs Bowel function  Reason for Assessment: Consult for Assessment of nutrition requirements for tube feeding  62 y.o. male  Admitting Dx: Acute respiratory failure  ASSESSMENT: 62 year old male found unresponsive in shower at home, became responsive and then lethgaric again, intubated upon arrival to ED. Pt has history of hypertension, hyperlipidemia, CAD, SBO, pancreatitis, CHF, GERD, Type II diabetes, myocardial infarction. Pt has severe sepsis, pulmonary edema, rhabdomyolysis, and UTI per MD note.  Pt intubated and on ventilator support at time of visit with OG tube in place. No family present in room to obtain additional background information about pt. Height was measured using paper measuring tape while pt was lying in bed- height may not be 100% accurate.  Height: Ht Readings from Last 1 Encounters:  08/14/12 6' (1.829 m)    Weight: Wt Readings from Last 1 Encounters:  08/14/12 330 lb  (149.687 kg)    Ideal Body Weight: 178 lbs  % Ideal Body Weight: 185%  Wt Readings from Last 10 Encounters:  08/14/12 330 lb (149.687 kg)  07/08/12 330 lb (149.687 kg)  06/01/12 312 lb 3.2 oz (141.613 kg)  05/07/12 303 lb (137.44 kg)  04/27/12 306 lb (138.801 kg)  03/28/12 320 lb (145.151 kg)  03/04/12 322 lb 1.5 oz (146.1 kg)  03/01/12 325 lb (147.419 kg)  01/06/12 326 lb 4.5 oz (148 kg)  01/06/12 326 lb 4.5 oz (148 kg)    Usual Body Weight: unknown  % Usual Body Weight: NA  BMI:  Body mass index is 44.75 kg/(m^2).  Patient is currently intubated on ventilator support.  MV: 10.1 Temp:Temp (24hrs), Avg:100.1 F (37.8 C), Min:98.5 F (36.9 C), Max:100.5 F (38.1 C)   Estimated Nutritional Needs: Kcal: 2713 Protein: 202 grams Fluid: 3.4 L  Skin: WDL  Diet Order: NPO  EDUCATION NEEDS: -No education needs identified at this time   Intake/Output Summary (Last 24 hours) at 08/14/12 1644 Last data filed at 08/14/12 1600  Gross per 24 hour  Intake  217.5 ml  Output   1150 ml  Net -932.5 ml    Last BM: PTA  Labs:   Recent Labs Lab 08/14/12 1100  NA 134*  K 4.9  CL 98  CO2 22  BUN 12  CREATININE 0.83  CALCIUM 9.4  GLUCOSE 272*    CBG (last 3)   Recent Labs  08/14/12 1037  GLUCAP 252*    Scheduled Meds: . aspirin  325 mg Per Tube Daily  . ceFEPime (MAXIPIME) IV  1  g Intravenous Q8H  . famotidine (PEPCID) IV  20 mg Intravenous Q12H  . heparin  5,000 Units Subcutaneous Q8H  . insulin aspart  0-15 Units Subcutaneous Q4H  . lidocaine (cardiac) 100 mg/5ml      . succinylcholine      . vancomycin  1,500 mg Intravenous Once  . [START ON 08/15/2012] vancomycin  1,500 mg Intravenous Q12H    Continuous Infusions: . dextrose 5 % and 0.45% NaCl 50 mL/hr at 08/14/12 1539  . propofol 20 mcg/kg/min (08/14/12 1623)    Past Medical History  Diagnosis Date  . Hypertension   . DVT (deep venous thrombosis) ~ 2011    BLE  . 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 Hyperlipidemia   . CAD (coronary atherosclerotic disease)     s/p stents x2  . Small bowel obstruction   . Pancreatitis   . Anxiety   . GERD (gastroesophageal reflux disease)   . CHF (congestive heart failure)   . Myocardial infarction ~ 2009  . Anginal pain   . Pneumonia ~ 2012  . Shortness of breath     "lying down & w/exertion" (03/04/2012)  . Type II diabetes mellitus   . Hep B w/o coma 1970's    "I was in [place]" (03/04/2012)  . Chronic lower back pain   . PTSD (post-traumatic stress disorder)     Past Surgical History  Procedure Laterality Date  . Esophagogastroduodenoscopy  09/22/2011    Procedure: ESOPHAGOGASTRODUODENOSCOPY (EGD);  Surgeon: Charna Elizabeth, MD;  Location: [facility] ENDOSCOPY;  Service: Endoscopy;  Laterality: N/A;  . Esophagogastroduodenoscopy  01/05/2012    Procedure: ESOPHAGOGASTRODUODENOSCOPY (EGD);  Surgeon: Rachael Fee, MD;  Location: [facility] ENDOSCOPY;  Service: Endoscopy;  Laterality: N/A;  . Cholecystectomy  ~ 2009  . Spinal growth rods  ~ 2011  . Tonsillectomy and adenoidectomy    . Coronary angioplasty with stent placement  ~ 2009`    "1 + ! (after 1st one failed)" (03/04/2012)    Ian Malkin RD, LDN Inpatient Clinical Dietitian Pager: [phone] After Hours Pager: [phone]

## 2012-08-14 NOTE — Progress Notes (Signed)
ED CM noted nursing note indicating issues with thorazine x 1 week Cm came to visit but unable to assess Pt intubated and receiving respiratory services at this time No noted family

## 2012-08-14 NOTE — Progress Notes (Signed)
eLink Physician-Brief Progress Note Patient Name: Ryan [name] DOB: July 26, 1950 MRN: [id number]  Date of Service  08/14/2012   HPI/Events of Note   Housekeeping issues  eICU Interventions  Vent, cxr and sedation order set sent   Intervention Category Minor Interventions: Clinical assessment - ordering diagnostic tests  Adolphe Fortunato 08/14/2012, 6:16 PM

## 2012-08-14 NOTE — ED Notes (Signed)
Report called to [facility]

## 2012-08-14 NOTE — Progress Notes (Signed)
ANTIBIOTIC CONSULT NOTE - INITIAL  Pharmacy Consult for Vancomycin, Renal Adjust Abx Indication: PNA/UTI  Allergies  Allergen Reactions  . Lisinopril Swelling and Rash  . Morphine And Related Swelling and Rash    Patient Measurements: Weight: 330 lb (149.687 kg)  Vital Signs: Temp: 100.5 F (38.1 C) (04/18 1200) BP: 172/78 mmHg (04/18 1200) Pulse Rate: 99 (04/18 1200) Intake/Output from previous day:   Intake/Output from this shift: Total I/O In: 200 [I.V.:200] Out: -   Labs:  Recent Labs  08/14/12 1100  WBC 11.9*  HGB 16.7  PLT 196  CREATININE 0.83   CrCl = 94 ml/min/1.67m2 (normalized)  Microbiology: 4/18 Blood x2: ordered 4/18 Urine: ordered  Medical History: Past Medical History  Diagnosis Date  . Hypertension   . DVT (deep venous thrombosis) ~ 2011    BLE  . 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 Hyperlipidemia   . CAD (coronary atherosclerotic disease)     s/p stents x2  . Small bowel obstruction   . Pancreatitis   . Anxiety   . GERD (gastroesophageal reflux disease)   . CHF (congestive heart failure)   . Myocardial infarction ~ 2009  . Anginal pain   . Pneumonia ~ 2012  . Shortness of breath     "lying down & w/exertion" (03/04/2012)  . Type II diabetes mellitus   . Hep B w/o coma 1970's    "I was in [place]" (03/04/2012)  . Chronic lower back pain   . PTSD (post-traumatic stress disorder)     Medications:  Scheduled:  . [COMPLETED] acetaminophen  650 mg Rectal Once  . aspirin  325 mg Per Tube Daily  . [COMPLETED] etomidate      . [COMPLETED] fentaNYL  150 mcg Intravenous Once  . heparin  5,000 Units Subcutaneous Q8H  . insulin aspart  0-15 Units Subcutaneous Q4H  . lidocaine (cardiac) 100 mg/49ml      . [COMPLETED] LORazepam  2 mg Intravenous Once  . [COMPLETED] naloxone      . [COMPLETED] naloxone      . [COMPLETED] rocuronium      . succinylcholine      . [DISCONTINUED] lidocaine (PF)  30 mL Intradermal  Once   Infusions:  . [COMPLETED] sodium chloride 100 mL/hr at 08/14/12 1025  . sodium chloride    . ceFEPime (MAXIPIME) IV    . dextrose 5 % and 0.45% NaCl    . famotidine (PEPCID) IV    . [COMPLETED] piperacillin-tazobactam Stopped (08/14/12 1237)  . [COMPLETED] propofol 1,000 mg (08/14/12 1137)  . propofol    . [COMPLETED] sodium chloride 1,000 mL (08/14/12 1237)  . [COMPLETED] sodium chloride 1,000 mL (08/14/12 1237)  . [COMPLETED] vancomycin Stopped (08/14/12 1428)  . [DISCONTINUED] propofol 10 mg (08/14/12 1137)   Assessment: 62 yo male found unresponsive in the shower today. Required intubation in ER for airway protection. CCM ordered to begin broad spectrum antibiotics for empiric coverage of PNA, UTI, sepsis.  Pt morbidly obese (BMI 39)  SCr normal, estimated CrCl ~ 95 ml/min  WBC and lactic acid elevated  Febrile at 100.5  Received Zosyn 3.375gm and Vancomycin 1gm in ER ~noon  Goal of Therapy:  Vancomycin trough level 15-20 mcg/ml Cefepime dose per renal function  Plan:   Increase Cefepime to 1gm IV q8h  Give an additional 1500mg  Vancomycin IV to = 2.5gm total load, then 1500mg  IV q12h Check trough at steady state Follow up renal function &  cultures  Loralee Pacas, PharmD, BCPS Pager: [phone] 08/14/2012,2:34 PM

## 2012-08-14 NOTE — ED Notes (Signed)
MD at bedside. 

## 2012-08-15 ENCOUNTER — Other Ambulatory Visit (HOSPITAL_COMMUNITY): Payer: Medicare Other

## 2012-08-15 ENCOUNTER — Inpatient Hospital Stay (HOSPITAL_COMMUNITY): Payer: Medicare Other

## 2012-08-15 DIAGNOSIS — R918 Other nonspecific abnormal finding of lung field: Secondary | ICD-10-CM

## 2012-08-15 DIAGNOSIS — E119 Type 2 diabetes mellitus without complications: Secondary | ICD-10-CM

## 2012-08-15 LAB — BASIC METABOLIC PANEL
BUN: 6 mg/dL (ref 6–23)
CO2: 28 mEq/L (ref 19–32)
Chloride: 99 mEq/L (ref 96–112)
Glucose, Bld: 230 mg/dL — ABNORMAL HIGH (ref 70–99)
Potassium: 4 mEq/L (ref 3.5–5.1)

## 2012-08-15 LAB — GLUCOSE, CAPILLARY: Glucose-Capillary: 284 mg/dL — ABNORMAL HIGH (ref 70–99)

## 2012-08-15 LAB — LEGIONELLA ANTIGEN, URINE: Legionella Antigen, Urine: NEGATIVE

## 2012-08-15 LAB — CBC
HCT: 45.8 % (ref 39.0–52.0)
Hemoglobin: 15 g/dL (ref 13.0–17.0)
MCHC: 32.8 g/dL (ref 30.0–36.0)
RBC: 4.87 MIL/uL (ref 4.22–5.81)
WBC: 9.2 10*3/uL (ref 4.0–10.5)

## 2012-08-15 LAB — HEMOGLOBIN A1C: Mean Plasma Glucose: 237 mg/dL — ABNORMAL HIGH (ref <117)

## 2012-08-15 MED ORDER — LORAZEPAM 2 MG/ML IJ SOLN: 0.5000 mg | INTRAMUSCULAR | Status: DC | PRN

## 2012-08-15 MED ORDER — HYDROMORPHONE HCL PF 1 MG/ML IJ SOLN
1.0000 mg | INTRAMUSCULAR | Status: DC | PRN
  Administered 2012-08-15 (×4): 1 mg via INTRAVENOUS
  Filled 2012-08-15 (×4): qty 1

## 2012-08-15 MED ORDER — INSULIN GLARGINE 100 UNIT/ML ~~LOC~~ SOLN
15.0000 [IU] | Freq: Every day | SUBCUTANEOUS | Status: DC
  Administered 2012-08-15: 15 [IU] via SUBCUTANEOUS
  Filled 2012-08-15 (×2): qty 0.15

## 2012-08-15 MED ORDER — OSMOLITE 1.2 CAL PO LIQD
1000.0000 mL | ORAL | Status: DC
  Administered 2012-08-15: 1000 mL

## 2012-08-15 MED ORDER — FAMOTIDINE 20 MG PO TABS
20.0000 mg | ORAL_TABLET | Freq: Two times a day (BID) | ORAL | Status: DC
  Filled 2012-08-15 (×2): qty 1

## 2012-08-15 MED ORDER — OXYCODONE HCL 20 MG/ML PO CONC
20.0000 mg | ORAL | Status: DC | PRN
  Administered 2012-08-15: 20 mg via ORAL

## 2012-08-15 MED ORDER — FAMOTIDINE 20 MG PO TABS
20.0000 mg | ORAL_TABLET | Freq: Two times a day (BID) | ORAL | Status: DC
  Administered 2012-08-15: 20 mg via ORAL
  Filled 2012-08-15: qty 1

## 2012-08-15 MED ORDER — CHLORHEXIDINE GLUCONATE 0.12 % MT SOLN
15.0000 mL | Freq: Two times a day (BID) | OROMUCOSAL | Status: DC
  Administered 2012-08-15 (×2): 15 mL via OROMUCOSAL
  Filled 2012-08-15 (×2): qty 15

## 2012-08-15 MED ORDER — SODIUM CHLORIDE 0.9 % IV SOLN
25.0000 ug/h | INTRAVENOUS | Status: DC
  Administered 2012-08-15: 50 ug/h via INTRAVENOUS
  Filled 2012-08-15: qty 50

## 2012-08-15 MED ORDER — HYDROMORPHONE HCL PF 2 MG/ML IJ SOLN
2.0000 mg | Freq: Once | INTRAMUSCULAR | Status: AC
  Administered 2012-08-15: 2 mg via INTRAVENOUS
  Filled 2012-08-15: qty 1

## 2012-08-15 MED ORDER — OXYCODONE HCL 5 MG PO TABS
20.0000 mg | ORAL_TABLET | ORAL | Status: DC | PRN
  Administered 2012-08-16 (×2): 20 mg via ORAL
  Filled 2012-08-15 (×2): qty 4

## 2012-08-15 MED ORDER — PANTOPRAZOLE SODIUM 40 MG PO TBEC
40.0000 mg | DELAYED_RELEASE_TABLET | Freq: Every day | ORAL | Status: DC
  Filled 2012-08-15: qty 1

## 2012-08-15 NOTE — Procedures (Signed)
Extubation Procedure Note  Patient Details:   Name: Ryan [name] DOB: Sep 09, 1950 MRN: [id number]   Airway Documentation:     Evaluation  O2 sats: 99 Complications: none Patient tolerate procedure well. Bilateral Breath Sounds: Diminished Suctioning: Oral;Airway Able to speak  Pt extubated per CCM order.  Pt tolerated well, no complications.  Placed on 3L nasal cannula.   Revonda Humphrey 08/15/2012, 5:55 PM

## 2012-08-15 NOTE — Progress Notes (Signed)
eLink Physician-Brief Progress Note Patient Name: Ryan [name] DOB: 17-Feb-1951 MRN: [id number]  Date of Service  08/15/2012   HPI/Events of Note   Pt complaining of 9/10 pain despite 1mg  dilaudid  eICU Interventions  Gave 1 time dose of 2mg  dilaudid, reviewed home meds and requested oxycodone PO be used as needed.    Intervention Category Intermediate Interventions: Pain - evaluation and management  GIDDINGS, OLIVIA K. 08/15/2012, 11:06 PM

## 2012-08-15 NOTE — Progress Notes (Signed)
[facility] reports positive blood cultures with aerobic gram and cocci clusters plus gram rods. Dr. Sung Amabile paged with report.

## 2012-08-15 NOTE — Progress Notes (Signed)
Patient complaining of significant pain. Ordered oxycodone 20 mg every 4 hours as needed down the OG tube.  Will consider extubation given alertness.

## 2012-08-15 NOTE — Progress Notes (Signed)
Propofol discontinued as ordered and fentanyl drip initiated at 50 mcg at 10:30. Pt indicated pain and increased rate to 100 mcg. Fifteen minutes later pt indicated by writing that fentanyl does not work for him. Increased fentanyl to max dose 150 mcg. Will continue to monitor and assess pain.

## 2012-08-15 NOTE — Progress Notes (Signed)
PCCM NOTE  Date of admission: 4/18 Pt Profile:  61 M transported by EMS to [facility] ED after fall in shower. Became progressively confused and obtunded. Intubated for AMS. PCCM asked to admit to ICU   STUDIES/EVENTS OF NOTE: 4/18 CT head: NAD   LINES/TUBES: ETT 4/18 >>  L  CVL 4/18 >>   MICRO:  MRSA PCR 4/18 >> neg  ABX:  Urine 4/18 >>  Resp 4/18 >>  Blood 4/18 >>  Strep Ag 4/19 >>  Legionella Ag 4/19 >>   PROPHYLAXIS:  DVT: SQ heparin  SUP: Famotidine  CONSULTANTS:    SUBJ: RASS -1. + F/C and answers seemingly appropriately. Failed SBT. Tolerates PS 12 cm H2O  Filed Vitals:   08/15/12 0500 08/15/12 0600 08/15/12 0800 08/15/12 0900  BP: 173/74 168/86 164/76 154/84  Pulse: 95 104 96 106  Temp:   100.9 F (38.3 C) 100.8 F (38.2 C)  TempSrc:   Core (Comment)   Resp: 16 18 7 23   Height:   6' (1.829 m)   Weight:   149.7 kg (330 lb 0.5 oz)   SpO2: 98% 96% 97% 97%    EXAM:  Gen: RASS -1, + F/C HEENT: [place]/AT, EOMI, PERRL Neck: No JVD noted, no LAN Lungs: scattered rhonchi Cardiovascular: RRR s M Abdomen: obese, soft, NT, no masses Ext: warm, trace symmetric edema Neuro: no focal deficits   DATA: BMET    Component Value Date/Time   NA 135 08/15/2012 0420   K 4.0 08/15/2012 0420   CL 99 08/15/2012 0420   CO2 28 08/15/2012 0420   GLUCOSE 230* 08/15/2012 0420   BUN 6 08/15/2012 0420   CREATININE 0.63 08/15/2012 0420   CALCIUM 8.7 08/15/2012 0420   GFRNONAA >90 08/15/2012 0420   GFRAA >90 08/15/2012 0420    CBC    Component Value Date/Time   WBC 9.2 08/15/2012 0420   RBC 4.87 08/15/2012 0420   HGB 15.0 08/15/2012 0420   HCT 45.8 08/15/2012 0420   PLT 223 08/15/2012 0420   MCV 94.0 08/15/2012 0420   MCH 30.8 08/15/2012 0420   MCHC 32.8 08/15/2012 0420   RDW 13.8 08/15/2012 0420   LYMPHSABS 1.0 08/14/2012 1100   MONOABS 0.7 08/14/2012 1100   EOSABS 0.0 08/14/2012 1100   BASOSABS 0.0 08/14/2012 1100    Urinalysis    Component Value Date/Time   COLORURINE RED*  08/14/2012 1109   APPEARANCEUR TURBID* 08/14/2012 1109   LABSPEC 1.031* 08/14/2012 1109   PHURINE 5.0 08/14/2012 1109   GLUCOSEU >1000* 08/14/2012 1109   HGBUR LARGE* 08/14/2012 1109   BILIRUBINUR SMALL* 08/14/2012 1109   KETONESUR NEGATIVE 08/14/2012 1109   PROTEINUR 100* 08/14/2012 1109   UROBILINOGEN 1.0 08/14/2012 1109   NITRITE POSITIVE* 08/14/2012 1109   LEUKOCYTESUR MODERATE* 08/14/2012 1109     CXR: LLL infiltrate vs atx +/- effusion   IMPRESSION:      Acute respiratory failure due to AMS Encephalopathy acute, unclear etiology - TME likely. ? Sepsis vs medications Severe sepsis likely due to urinary tract infection Pulmonary infiltrate - possible PNA Narcotic dependence H/O anxiety and depression DIABETES MELLITUS, TYPE II - suboptimal control H/O HYPERTENSION H/O CORONARY ARTERY DISEASE - AMI r/o'd by markers History of DVT (deep vein thrombosis)    PLAN:  Vent settings adjusted Vent bundle continued DVT prophy and SUP as above  Change to continuous fentanyl infusion as pt uses chronic opioids @ home IVFs adjusted Begin TFs SSI continued. Begin Lantus Micro and empiric abx  as above    35 mins CCM time   Billy Fischer, MD ; [facility] [phone].  After 5:30 PM or weekends, call [phone]

## 2012-08-16 ENCOUNTER — Inpatient Hospital Stay (HOSPITAL_COMMUNITY): Payer: Medicare Other

## 2012-08-16 DIAGNOSIS — F1920 Other psychoactive substance dependence, uncomplicated: Secondary | ICD-10-CM

## 2012-08-16 LAB — BASIC METABOLIC PANEL
BUN: 5 mg/dL — ABNORMAL LOW (ref 6–23)
Chloride: 102 mEq/L (ref 96–112)
Creatinine, Ser: 0.6 mg/dL (ref 0.50–1.35)
GFR calc Af Amer: >90 mL/min (ref >90)
Glucose, Bld: 198 mg/dL — ABNORMAL HIGH (ref 70–99)
Potassium: 3.2 mEq/L — ABNORMAL LOW (ref 3.5–5.1)

## 2012-08-16 LAB — CBC
HCT: 39.3 % (ref 39.0–52.0)
Hemoglobin: 12.7 g/dL — ABNORMAL LOW (ref 13.0–17.0)
MCH: 30.9 pg (ref 26.0–34.0)
MCHC: 32.3 g/dL (ref 30.0–36.0)
MCV: 95.6 fL (ref 78.0–100.0)
RDW: 14 % (ref 11.5–15.5)

## 2012-08-16 LAB — URINE CULTURE: Colony Count: >=100000

## 2012-08-16 LAB — CULTURE, BLOOD (ROUTINE X 2)

## 2012-08-16 LAB — GLUCOSE, CAPILLARY
Glucose-Capillary: 175 mg/dL — ABNORMAL HIGH (ref 70–99)
Glucose-Capillary: 191 mg/dL — ABNORMAL HIGH (ref 70–99)
Glucose-Capillary: 348 mg/dL — ABNORMAL HIGH (ref 70–99)
Glucose-Capillary: 191 mg/dL — ABNORMAL HIGH (ref 70–99)

## 2012-08-16 MED ORDER — HYDROMORPHONE HCL PF 2 MG/ML IJ SOLN
INTRAMUSCULAR | Status: AC
  Administered 2012-08-16: 2 mg via INTRAVENOUS
  Filled 2012-08-16: qty 1

## 2012-08-16 MED ORDER — INSULIN ASPART 100 UNIT/ML ~~LOC~~ SOLN
4.0000 [IU] | Freq: Three times a day (TID) | SUBCUTANEOUS | Status: DC
  Administered 2012-08-16 – 2012-08-19 (×9): 4 [IU] via SUBCUTANEOUS

## 2012-08-16 MED ORDER — GABAPENTIN 400 MG PO CAPS
400.0000 mg | ORAL_CAPSULE | Freq: Three times a day (TID) | ORAL | Status: DC
  Administered 2012-08-16 – 2012-08-19 (×10): 400 mg via ORAL
  Filled 2012-08-16 (×12): qty 1

## 2012-08-16 MED ORDER — INSULIN ASPART 100 UNIT/ML ~~LOC~~ SOLN: 0.0000 [IU] | Freq: Every day | SUBCUTANEOUS | Status: DC

## 2012-08-16 MED ORDER — ASPIRIN EC 81 MG PO TBEC
81.0000 mg | DELAYED_RELEASE_TABLET | Freq: Every day | ORAL | Status: DC
  Administered 2012-08-16 – 2012-08-19 (×4): 81 mg via ORAL
  Filled 2012-08-16 (×4): qty 1

## 2012-08-16 MED ORDER — TRAZODONE HCL 100 MG PO TABS
100.0000 mg | ORAL_TABLET | Freq: Every day | ORAL | Status: DC
  Administered 2012-08-16 – 2012-08-18 (×3): 100 mg via ORAL
  Filled 2012-08-16 (×4): qty 1

## 2012-08-16 MED ORDER — OXYCODONE HCL ER 40 MG PO T12A
80.0000 mg | EXTENDED_RELEASE_TABLET | Freq: Two times a day (BID) | ORAL | Status: DC
  Administered 2012-08-16 – 2012-08-19 (×7): 80 mg via ORAL
  Filled 2012-08-16 (×3): qty 2
  Filled 2012-08-16: qty 4
  Filled 2012-08-16 (×3): qty 2

## 2012-08-16 MED ORDER — ENOXAPARIN SODIUM 40 MG/0.4ML ~~LOC~~ SOLN
40.0000 mg | SUBCUTANEOUS | Status: DC
  Filled 2012-08-16: qty 0.4

## 2012-08-16 MED ORDER — INSULIN GLARGINE 100 UNIT/ML ~~LOC~~ SOLN
10.0000 [IU] | Freq: Every day | SUBCUTANEOUS | Status: DC
  Administered 2012-08-16 – 2012-08-18 (×3): 10 [IU] via SUBCUTANEOUS
  Filled 2012-08-16 (×6): qty 0.1

## 2012-08-16 MED ORDER — ENOXAPARIN SODIUM 80 MG/0.8ML ~~LOC~~ SOLN
75.0000 mg | SUBCUTANEOUS | Status: DC
  Administered 2012-08-16 – 2012-08-19 (×4): 75 mg via SUBCUTANEOUS
  Filled 2012-08-16 (×4): qty 0.8

## 2012-08-16 MED ORDER — OXYCODONE HCL 5 MG PO TABS: 30.0000 mg | ORAL_TABLET | ORAL | Status: DC | PRN

## 2012-08-16 MED ORDER — PANTOPRAZOLE SODIUM 40 MG PO TBEC
40.0000 mg | DELAYED_RELEASE_TABLET | Freq: Every day | ORAL | Status: DC
  Administered 2012-08-16 – 2012-08-19 (×4): 40 mg via ORAL
  Filled 2012-08-16 (×3): qty 1

## 2012-08-16 MED ORDER — OXYCODONE HCL ER 20 MG PO T12A: 60.0000 mg | EXTENDED_RELEASE_TABLET | Freq: Three times a day (TID) | ORAL | Status: DC

## 2012-08-16 MED ORDER — OXYCODONE HCL ER 60 MG PO T12A: 1.0000 | EXTENDED_RELEASE_TABLET | Freq: Three times a day (TID) | ORAL | Status: DC

## 2012-08-16 MED ORDER — DULOXETINE HCL 30 MG PO CPEP
30.0000 mg | ORAL_CAPSULE | Freq: Every day | ORAL | Status: DC
  Administered 2012-08-16 – 2012-08-19 (×4): 30 mg via ORAL
  Filled 2012-08-16 (×4): qty 1

## 2012-08-16 MED ORDER — HYDROMORPHONE HCL PF 2 MG/ML IJ SOLN: 2.0000 mg | Freq: Once | INTRAMUSCULAR | Status: AC

## 2012-08-16 MED ORDER — ALPRAZOLAM 1 MG PO TABS
1.0000 mg | ORAL_TABLET | Freq: Two times a day (BID) | ORAL | Status: DC
  Administered 2012-08-16 – 2012-08-19 (×7): 1 mg via ORAL
  Filled 2012-08-16 (×7): qty 1

## 2012-08-16 MED ORDER — OXYCODONE HCL 5 MG PO TABS
30.0000 mg | ORAL_TABLET | ORAL | Status: DC | PRN
  Administered 2012-08-16 – 2012-08-19 (×14): 30 mg via ORAL
  Filled 2012-08-16 (×14): qty 6

## 2012-08-16 MED ORDER — OXYCODONE HCL ER 20 MG PO T12A: 20.0000 mg | EXTENDED_RELEASE_TABLET | Freq: Two times a day (BID) | ORAL | Status: DC

## 2012-08-16 MED ORDER — INSULIN ASPART 100 UNIT/ML ~~LOC~~ SOLN
0.0000 [IU] | Freq: Three times a day (TID) | SUBCUTANEOUS | Status: DC
  Administered 2012-08-16: 11 [IU] via SUBCUTANEOUS
  Administered 2012-08-16: 3 [IU] via SUBCUTANEOUS
  Administered 2012-08-17: 5 [IU] via SUBCUTANEOUS
  Administered 2012-08-17 – 2012-08-18 (×3): 3 [IU] via SUBCUTANEOUS
  Administered 2012-08-18: 2 [IU] via SUBCUTANEOUS
  Administered 2012-08-19 (×2): 3 [IU] via SUBCUTANEOUS

## 2012-08-16 MED ORDER — LOSARTAN POTASSIUM 50 MG PO TABS
100.0000 mg | ORAL_TABLET | Freq: Every day | ORAL | Status: DC
  Administered 2012-08-16 – 2012-08-19 (×4): 100 mg via ORAL
  Filled 2012-08-16 (×4): qty 2

## 2012-08-16 MED ORDER — GLIPIZIDE ER 10 MG PO TB24
10.0000 mg | ORAL_TABLET | Freq: Two times a day (BID) | ORAL | Status: DC
  Administered 2012-08-16 – 2012-08-18 (×5): 10 mg via ORAL
  Filled 2012-08-16 (×8): qty 1

## 2012-08-16 MED ORDER — POTASSIUM CHLORIDE CRYS ER 20 MEQ PO TBCR
40.0000 meq | EXTENDED_RELEASE_TABLET | Freq: Once | ORAL | Status: AC
  Administered 2012-08-16: 40 meq via ORAL
  Filled 2012-08-16: qty 2

## 2012-08-16 MED ORDER — LACTULOSE 10 GM/15ML PO SOLN
20.0000 g | Freq: Two times a day (BID) | ORAL | Status: DC
  Administered 2012-08-16 – 2012-08-19 (×7): 20 g via ORAL
  Filled 2012-08-16 (×8): qty 30

## 2012-08-16 NOTE — Progress Notes (Signed)
eLink Physician-Brief Progress Note Patient Name: Ryan [name] DOB: [birth year] MRN: [id number]  Date of Service  08/16/2012   HPI/Events of Note   hypokalemic  eICU Interventions  repleted potassium   Intervention Category Intermediate Interventions: Electrolyte abnormality - evaluation and management  Tahira Olivarez K. 08/16/2012, 6:47 AM

## 2012-08-16 NOTE — Progress Notes (Signed)
PCCM NOTE  Date of admission: 4/18 Pt Profile:  76 M transported by EMS to [facility] ED after fall in shower. Became progressively confused and obtunded. Intubated for AMS. PCCM asked to admit to ICU   STUDIES/EVENTS OF NOTE: 4/18 CT head: NAD   LINES/TUBES: ETT 4/18 >> 4/19 L [place] CVL 4/18 >>   MICRO:  MRSA PCR 4/18 >> neg Urine 4/18 >>  > 100k E coli Strep Ag 4/19 >> NEG Legionella Ag 4/19 >> NEG Blood 4/18 >> 1/2 GPC clusters, GPRs (prob contaminant) >>  Resp 4/18 >>    ABX:   Vanc 4/18 >> 4/20 Cefepime 4/18 >>    PROPHYLAXIS:  DVT: SQ heparin  SUP: PPI (uses chronically)  CONSULTANTS:    SUBJ: Difficult pain control last PM. Extubated per eMD last PM.  No distress. C/O severe neuropathic LE pain He is not able to provide any further insights into events surrounding his admission  Filed Vitals:   08/16/12 0500 08/16/12 0600 08/16/12 0700 08/16/12 0800  BP: 98/45 136/65 127/65 139/66  Pulse: 87 90 87 84  Temp:      TempSrc:      Resp: 15 14 16 15   Height:      Weight:      SpO2: 96% 97% 96% 97%    EXAM:  Gen: RASS 0, + F/C. NAD HEENT: [place]/AT, EOMI, PERRL Neck: No JVD noted, no LAN Lungs: scattered rhonchi Cardiovascular: RRR s M Abdomen: obese, soft, NT, no masses Ext: warm, no edema Neuro: no focal deficits   DATA: BMET    Component Value Date/Time   NA 136 08/16/2012 0535   K 3.2* 08/16/2012 0535   CL 102 08/16/2012 0535   CO2 31 08/16/2012 0535   GLUCOSE 198* 08/16/2012 0535   BUN 5* 08/16/2012 0535   CREATININE 0.60 08/16/2012 0535   CALCIUM 8.3* 08/16/2012 0535   GFRNONAA >90 08/16/2012 0535   GFRAA >90 08/16/2012 0535    CBC    Component Value Date/Time   WBC 8.8 08/16/2012 0535   RBC 4.11* 08/16/2012 0535   HGB 12.7* 08/16/2012 0535   HCT 39.3 08/16/2012 0535   PLT 189 08/16/2012 0535   MCV 95.6 08/16/2012 0535   MCH 30.9 08/16/2012 0535   MCHC 32.3 08/16/2012 0535   RDW 14.0 08/16/2012 0535   LYMPHSABS 1.0 08/14/2012 1100   MONOABS 0.7  08/14/2012 1100   EOSABS 0.0 08/14/2012 1100   BASOSABS 0.0 08/14/2012 1100    Urinalysis    Component Value Date/Time   COLORURINE RED* 08/14/2012 1109   APPEARANCEUR TURBID* 08/14/2012 1109   LABSPEC 1.031* 08/14/2012 1109   PHURINE 5.0 08/14/2012 1109   GLUCOSEU >1000* 08/14/2012 1109   HGBUR LARGE* 08/14/2012 1109   BILIRUBINUR SMALL* 08/14/2012 1109   KETONESUR NEGATIVE 08/14/2012 1109   PROTEINUR 100* 08/14/2012 1109   UROBILINOGEN 1.0 08/14/2012 1109   NITRITE POSITIVE* 08/14/2012 1109   LEUKOCYTESUR MODERATE* 08/14/2012 1109     CXR: L>R basilar infiltrates   IMPRESSION:      Acute respiratory failure due to AMS, resolved Encephalopathy acute, unclear etiology, resolved Severe sepsis likely due to E coli urinary tract infection, resolving Pulmonary infiltrate - possible PNA, NOS Narcotic dependence - severe LE neuropathic pain H/O anxiety and depression DIABETES MELLITUS, TYPE II - suboptimal control H/O HYPERTENSION H/O CORONARY ARTERY DISEASE -   AMI r/o'd by markers History of DVT (deep vein thrombosis)    PLAN:  Transfer to med-surg bed Automatic Data  to CHO modified D/C Foley cath  PRN I/O if unable to void Cont DVT prophy and SUP as above  Resume home analgesics Resume home DM regimen NSL IV Will leave CVL as he has poor venous access D/C Vanc as "+" BC is almost certainly contaminant Cont cefepime until sensitivities available, narrow accordingly    35 mins CCM time   Billy Fischer, MD ; [facility] service Mobile [phone].  After 5:30 PM or weekends, call [phone]

## 2012-08-16 NOTE — Progress Notes (Signed)
08/16/12 1845 Patient was bladder scanned .  In/out cath output.

## 2012-08-17 ENCOUNTER — Encounter: Payer: Self-pay | Admitting: Emergency Medicine

## 2012-08-17 ENCOUNTER — Inpatient Hospital Stay (HOSPITAL_COMMUNITY): Payer: Medicare Other

## 2012-08-17 DIAGNOSIS — G8929 Other chronic pain: Secondary | ICD-10-CM

## 2012-08-17 DIAGNOSIS — R339 Retention of urine, unspecified: Secondary | ICD-10-CM | POA: Diagnosis present

## 2012-08-17 LAB — CULTURE, RESPIRATORY W GRAM STAIN: Special Requests: NORMAL

## 2012-08-17 LAB — GLUCOSE, CAPILLARY
Glucose-Capillary: 151 mg/dL — ABNORMAL HIGH (ref 70–99)
Glucose-Capillary: 182 mg/dL — ABNORMAL HIGH (ref 70–99)

## 2012-08-17 MED ORDER — LEVOFLOXACIN 500 MG PO TABS
500.0000 mg | ORAL_TABLET | Freq: Every day | ORAL | Status: DC
  Administered 2012-08-17 – 2012-08-18 (×2): 500 mg via ORAL
  Filled 2012-08-17 (×3): qty 1

## 2012-08-17 MED ORDER — METFORMIN HCL 500 MG PO TABS
1000.0000 mg | ORAL_TABLET | Freq: Two times a day (BID) | ORAL | Status: DC
  Administered 2012-08-17: 1000 mg via ORAL
  Filled 2012-08-17 (×4): qty 2

## 2012-08-17 MED ORDER — SENNA-DOCUSATE SODIUM 8.6-50 MG PO TABS: 2.0000 | ORAL_TABLET | Freq: Every day | ORAL | Status: DC

## 2012-08-17 MED ORDER — SENNOSIDES-DOCUSATE SODIUM 8.6-50 MG PO TABS
2.0000 | ORAL_TABLET | Freq: Every morning | ORAL | Status: DC
  Administered 2012-08-17 – 2012-08-19 (×3): 2 via ORAL
  Filled 2012-08-17 (×3): qty 2

## 2012-08-17 MED ORDER — POTASSIUM CHLORIDE CRYS ER 20 MEQ PO TBCR
40.0000 meq | EXTENDED_RELEASE_TABLET | Freq: Two times a day (BID) | ORAL | Status: AC
  Administered 2012-08-17 (×2): 40 meq via ORAL
  Filled 2012-08-17 (×2): qty 2

## 2012-08-17 NOTE — Progress Notes (Signed)
Per MD order, central line removed. IV cathter intact. Vaseline pressure gauze to site, pressure held x 5 min, no bleeding to site. Pt instructed not to get out of bed for 30 min after the removal of the central line. Instucted to keep dressing CDI x 24hours, if bleeding occurs hold pressure, and contact staff nurse. Pt verbalized understanding by repeating instructions and did not have any questions. Consuello Masse

## 2012-08-17 NOTE — Evaluation (Signed)
Physical Therapy Evaluation Patient Details Name: Ryan [name] MRN: [id number] DOB: [birth year] Today's Date: 08/17/2012 Time: 5284-1324 PT Time Calculation (min): 32 min  PT Assessment / Plan / Recommendation Clinical Impression  62 yo male admitted after fall in shower, septic shock on 08/14/12. Pt has h/o neuropathy and pain. Pt. did mbilize to edge of the bed but was not safe/strong enough for safely standing. Pt. will benefit from PT to improve functional mobility and safety to DC to next venue.    PT Assessment  Patient needs continued PT services    Follow Up Recommendations  SNF    Does the patient have the potential to tolerate intense rehabilitation      Barriers to Discharge Decreased caregiver support      Equipment Recommendations  None recommended by PT    Recommendations for Other Services OT consult   Frequency Min 3X/week    Precautions / Restrictions Precautions Precautions: Fall   Pertinent Vitals/Pain 7 in feet and legs      Mobility  Bed Mobility Bed Mobility: Supine to Sit;Sitting - Scoot to Edge of Bed;Sit to Supine Supine to Sit: 1: +2 Total assist;HOB elevated;With rails Sitting - Scoot to Edge of Bed: 1: +2 Total assist Sitting - Scoot to Edge of Bed: Patient Percentage: 40% Sit to Supine: HOB flat;1: +2 Total assist Sit to Supine: Patient Percentage: 10% Details for Bed Mobility Assistance: support to move legs off of bed and then back onto bed , asssutance with trunk to lie down. Transfers Transfers: Sit to Stand;Stand to Sit;Stand Pivot Transfers Sit to Stand: 1: +2 Total assist Sit to Stand: Patient Percentage: 20% Details for Transfer Assistance: Pt. made partial attempts to stand from elevated bed but deemed too at risk for legs to buckle    Exercises     PT Diagnosis: Difficulty walking;Generalized weakness;Acute pain  PT Problem List: Decreased strength;Decreased range of motion;Decreased activity tolerance;Decreased  balance;Decreased mobility;Decreased knowledge of use of DME;Decreased safety awareness;Decreased knowledge of precautions;Decreased skin integrity;Obesity;Impaired sensation;Cardiopulmonary status limiting activity PT Treatment Interventions: DME instruction;Gait training;Functional mobility training;Therapeutic activities;Therapeutic exercise;Patient/family education   PT Goals Acute Rehab PT Goals PT Goal Formulation: With patient Time For Goal Achievement: 08/31/12 Potential to Achieve Goals: Fair Pt will go Supine/Side to Sit: with modified independence PT Goal: Supine/Side to Sit - Progress: Goal set today Pt will go Sit to Supine/Side: with modified independence PT Goal: Sit to Supine/Side - Progress: Goal set today Pt will go Sit to Stand: with mod assist PT Goal: Sit to Stand - Progress: Goal set today Pt will go Stand to Sit: with min assist Pt will Transfer Bed to Chair/Chair to Bed: with mod assist Pt will Ambulate: with +2 total assist;1 - 15 feet PT Goal: Ambulate - Progress: Goal set today  Visit Information  Last PT Received On: 08/17/12 Assistance Needed: +2    Subjective Data  Subjective: I am trying as best I can. I just don't think I can stand up Patient Stated Goal: to get better.   Prior Functioning  Home Living Lives With: Friend(s) Available Help at Discharge: Friend(s);Available PRN/intermittently Type of Home: Mobile home Home Access: Stairs to enter Entrance Stairs-Number of Steps: 6 Entrance Stairs-Rails: Right;Left Home Layout: One level Bathroom Shower/Tub: Engineer, manufacturing systems: Standard Home Adaptive Equipment: Environmental consultant - four wheeled;Walker - rolling;Shower chair with back Prior Function Level of Independence: Needs assistance Needs Assistance: Bathing;Meal Prep;Transfers;Gait Bath: Minimal Gait Assistance: with RW Able to Take Stairs?: Yes  Cognition  Cognition Arousal/Alertness: Awake/alert Behavior During Therapy: WFL for  tasks assessed/performed Overall Cognitive Status: Within Functional Limits for tasks assessed    Extremity/Trunk Assessment Right Upper Extremity Assessment RUE ROM/Strength/Tone: WFL for tasks assessed Left Upper Extremity Assessment LUE ROM/Strength/Tone: WFL for tasks assessed Right Lower Extremity Assessment RLE ROM/Strength/Tone: Deficits RLE ROM/Strength/Tone Deficits: grossly 3+ hip flexion, knee ext strength. unable to fully stand safely RLE Sensation: Deficits;History of peripheral neuropathy RLE Sensation Deficits: hypersensitive,  Left Lower Extremity Assessment LLE ROM/Strength/Tone: Deficits LLE ROM/Strength/Tone Deficits: same as R LLE Sensation: History of peripheral neuropathy;Deficits   Balance Balance Balance Assessed: Yes Static Sitting Balance Static Sitting - Balance Support: Bilateral upper extremity supported Static Sitting - Level of Assistance: 5: Stand by assistance  End of Session PT - End of Session Equipment Utilized During Treatment: Gait belt;Oxygen Activity Tolerance: Patient limited by fatigue;Patient limited by pain Patient left: in bed;with call bell/phone within reach;with family/visitor present Nurse Communication: Mobility status;Need for lift equipment  GP     Rada Hay 08/17/2012, 1:39 PM

## 2012-08-17 NOTE — Progress Notes (Addendum)
Clinical Social Work Department CLINICAL SOCIAL WORK PLACEMENT NOTE 08/17/2012  Patient:  Henrie,Edwen A  Account Number:  [id number] Admit date:  08/14/2012  Clinical Social Worker:  Jacelyn Grip  Date/time:  08/17/2012 03:00 PM  Clinical Social Work is seeking post-discharge placement for this patient at the following level of care:   SKILLED NURSING   (*CSW will update this form in Epic as items are completed)   08/17/2012  Patient/family provided with [facility] Department of Clinical Social Work's list of facilities offering this level of care within the geographic area requested by the patient (or if unable, by the patient's family).  08/17/2012  Patient/family informed of their freedom to choose among providers that offer the needed level of care, that participate in Medicare, Medicaid or managed care program needed by the patient, have an available bed and are willing to accept the patient.  08/17/2012  Patient/family informed of [facility]' ownership interest in [facility], as well as of the fact that they are under no obligation to receive care at this facility.  PASARR submitted to EDS on 08/17/2012 PASARR number received from EDS on 08/17/2012-existing Pasarr  FL2 transmitted to all facilities in geographic area requested by pt/family on  08/17/2012 FL2 transmitted to all facilities within larger geographic area on   Patient informed that his/her managed care company has contracts with or will negotiate with  certain facilities, including the following:     Patient/family informed of bed offers received:  08/18/2012 Patient chooses bed at [facility] Physician recommends and patient chooses bed at    Patient to be transferred to  on  [facility] on 08/19/2012 Patient to be transferred to facility by ambulance Sharin Mons)  The following physician request were entered in Epic:   Additional Comments:  Jacklynn Lewis, MSW, LCSWA   Clinical Social Work [phone]

## 2012-08-17 NOTE — Progress Notes (Signed)
PCCM NOTE  Date of admission: 4/18 Pt Profile:  63 M transported by EMS to [facility] ED after fall in shower. Became progressively confused and obtunded. Intubated for AMS. PCCM asked to admit to ICU   STUDIES/EVENTS OF NOTE: 4/18 CT head: NAD   LINES/TUBES: ETT 4/18 >> 4/19 L [place] CVL 4/18 >> 4/21  MICRO:  MRSA PCR 4/18 >> neg Urine 4/18 >>  > 100k E coli Strep Ag 4/19 >> NEG Legionella Ag 4/19 >> NEG Blood 4/18 >> 1/2 coag neg staph and diptheroids (contaminant)  Resp 4/18 >>    ABX:   Vanc 4/18 >> 4/20 Cefepime 4/18 >> 4/21 Levoflox 4/21 >> (5 doses ordered)   PROPHYLAXIS:  DVT: LMWH  SUP: PPI (uses chronically)  CONSULTANTS:    SUBJ: Still c/o pain. No respiratory distress. Urinary retention last PM and Foley cath was replaced. Feels that he would be able to void if he could stand up.   Filed Vitals:   08/16/12 1000 08/16/12 1410 08/16/12 2105 08/17/12 0447  BP: 151/71 142/67 148/81 141/68  Pulse: 91 89 84 87  Temp:  97.9 F (36.6 C) 98.6 F (37 C) 98.6 F (37 C)  TempSrc:  Oral Oral Oral  Resp: 15 20 18 18   Height:  6\' 5"  (1.956 m)    Weight:  151.2 kg (333 lb 5.4 oz)    SpO2: 97% 96% 94% 90%    EXAM:  Gen: RASS 0, + F/C. NAD HEENT: [place]/AT, EOMI, PERRL Neck: No JVD noted, no LAN Lungs: scattered rhonchi Cardiovascular: RRR s M Abdomen: obese, soft, NT, no masses Ext: warm, no edema Neuro: no focal deficits   DATA: BMET    Component Value Date/Time   NA 136 08/16/2012 0535   K 3.2* 08/16/2012 0535   CL 102 08/16/2012 0535   CO2 31 08/16/2012 0535   GLUCOSE 198* 08/16/2012 0535   BUN 5* 08/16/2012 0535   CREATININE 0.60 08/16/2012 0535   CALCIUM 8.3* 08/16/2012 0535   GFRNONAA >90 08/16/2012 0535   GFRAA >90 08/16/2012 0535    CBC    Component Value Date/Time   WBC 8.8 08/16/2012 0535   RBC 4.11* 08/16/2012 0535   HGB 12.7* 08/16/2012 0535   HCT 39.3 08/16/2012 0535   PLT 189 08/16/2012 0535   MCV 95.6 08/16/2012 0535   MCH 30.9 08/16/2012 0535   MCHC 32.3 08/16/2012 0535   RDW 14.0 08/16/2012 0535   LYMPHSABS 1.0 08/14/2012 1100   MONOABS 0.7 08/14/2012 1100   EOSABS 0.0 08/14/2012 1100   BASOSABS 0.0 08/14/2012 1100    Urinalysis    Component Value Date/Time   COLORURINE RED* 08/14/2012 1109   APPEARANCEUR TURBID* 08/14/2012 1109   LABSPEC 1.031* 08/14/2012 1109   PHURINE 5.0 08/14/2012 1109   GLUCOSEU >1000* 08/14/2012 1109   HGBUR LARGE* 08/14/2012 1109   BILIRUBINUR SMALL* 08/14/2012 1109   KETONESUR NEGATIVE 08/14/2012 1109   PROTEINUR 100* 08/14/2012 1109   UROBILINOGEN 1.0 08/14/2012 1109   NITRITE POSITIVE* 08/14/2012 1109   LEUKOCYTESUR MODERATE* 08/14/2012 1109     CXR: Improving L basilar aeration   IMPRESSION:     Acute respiratory failure intubated in ED primarily for AMS. Now resolved   Severe sepsis, resolved   Urinary tract infection, E coli, pansensitive   Pulmonary infiltrate in left lung on chest x-ray, likely PNA, NOS   Urinary retention - denies this problem PTA   DIABETES MELLITUS, TYPE II   HYPERTENSION   Chronic pain -  followed by [facility] as outpt   Narcotic dependence   Anxiety and depression   CORONARY ARTERY DISEASE, inactive   History of DVT (deep vein thrombosis) - details otherwise unknown   Encephalopathy acute, resolved    PLAN:  Discharge planning - care manager consult requested. Social services consult requested Mobilize. PT consult requested 4/21 Home pain meds restarted 4/20 - continue same Resume metformin 4/21 Foley cath replaced 4/20. Will leave in for now. Once more mobile, if still unable to void, consider Urology eval Cont DVT prophy and SUP as above  D/C CVL 4/21 Narrow abx to PO levoflox and complete 5 days more   Discussed with [facility]. They will assume care as of 4/22 and PCCM will sign off   Billy Fischer, MD ; [facility] [phone].  After 5:30 PM or weekends, call [phone]

## 2012-08-17 NOTE — Progress Notes (Signed)
I received a brief report from Dr. Sung Amabile regarding assuming care for pt on 08/18/12.  Patient was initially admitted for mental status change and intubated for unclear reasons.  The patient was extubated after 36 hours. No respiratory distress at this time. Mental status change likely due to infectious processes including pneumonia and UTI.  Plan to de-escalate abx.  Pt may need adjustment to his pain meds.  Moved out of ICU on 08/16/12.  DTat

## 2012-08-17 NOTE — Progress Notes (Signed)
Clinical Social Work Department BRIEF PSYCHOSOCIAL ASSESSMENT 08/17/2012  Patient:  Ryan [name],Ryan [name]     Account Number:  [id number]     Admit date:  08/14/2012  Clinical Social Worker:  Jacelyn Grip  Date/Time:  08/17/2012 01:30 PM  Referred by:  Physician  Date Referred:  08/17/2012 Referred for  Other - See comment  SNF Placement   Other Referral:   pt and pt mother wanted to speak with CSW re: pt "living conditions"   Interview type:  Patient Other interview type:   patient family members at bedside    PSYCHOSOCIAL DATA Living Status:  ALONE Admitted from facility:   Level of care:   Primary support name:  Ryan [name]/mother/[phone] Primary support relationship to patient:  PARENT Degree of support available:   adequate    CURRENT CONCERNS Current Concerns  Post-Acute Placement   Other Concerns:    SOCIAL WORK ASSESSMENT / PLAN CSW received referral that pt and pt mother wanted to speak with CSW re: pt living conditions.    CSW and RNCM met with pt and pt family members at bedside including pt mother. Pt and pt family expressed concern about pt ability to care for himself after discharge. CSW reviewed chart and noted that pt had been evaluated by PT and recommendation is for SNF. CSW discussed this with pt and pt family and pt is agreeable to SNF search in [place]. Pt and pt family are interested in information to assist them with securing longer term placement for pt as they are unsure if he will ever be able to return home. CSW discussed applying for Medicaid and provided information about applying for Medicaid and [name] Medicaid application.    CSW completed FL2 and initiated SNF search to [facility]. CSW to follow up with pt and pt family re: bed offers.    CSW to continue to follow and facilitate pt discharge needs when pt medically stable for discharge.   Assessment/plan status:  Psychosocial Support/Ongoing Assessment of Needs Other  assessment/ plan:   discharge planning   Information/referral to community resources:   [facility] list, [place] Information and Application    PATIENT'S/FAMILY'S RESPONSE TO PLAN OF CARE: Pt alert and oriented x 4. Pt and pt family are eager to begin looking into options for pt long term, but agreeable to SNF search for rehab as the complete the necessary applications for Medicaid that will be able to assist with longer term placement. Pt and pt family appreciative of CSW and RNCM assistance.     Jacklynn Lewis, MSW, LCSWA  Clinical Social Work [phone]

## 2012-08-17 NOTE — Progress Notes (Signed)
Nutrition Brief Note  - Pt extubated 4/19. Pt currently on CHO modified diet eating 100%. Met with pt who reports poor appetite for 1 week PTA however states his appetite has returned. Pt unsure of any weight loss. Past records show pt's weight actually up 3 pounds in the past month.   Wt Readings from Last 5 Encounters:  08/16/12 333 lb 5.4 oz (151.2 kg)  07/08/12 330 lb (149.687 kg)  06/01/12 312 lb 3.2 oz (141.613 kg)  05/07/12 303 lb (137.44 kg)  04/27/12 306 lb (138.801 kg)     NUTRITION DIAGNOSIS:  Inadequate oral intake related to inability to eat as evidenced by NPO status and pt on Vent - resolved, pt extubated, diet advanced, and eating excellent.   Goal:  Enteral nutrition to provide 60-70% of estimated calorie needs (22-25 kcals/kg ideal body weight) and 100% of estimated protein needs, based on ASPEN guidelines for permissive underfeeding in critically ill obese individuals - not met, pt not on TF  No nutrition interventions indicated at this time.   Levon Hedger MS, RD, LDN [phone] Pager [phone] After Hours Pager

## 2012-08-18 DIAGNOSIS — M48061 Spinal stenosis, lumbar region without neurogenic claudication: Secondary | ICD-10-CM

## 2012-08-18 LAB — COMPREHENSIVE METABOLIC PANEL
AST: 114 U/L — ABNORMAL HIGH (ref 0–37)
Albumin: 2.2 g/dL — ABNORMAL LOW (ref 3.5–5.2)
Alkaline Phosphatase: 77 U/L (ref 39–117)
Chloride: 101 mEq/L (ref 96–112)
Potassium: 4.1 mEq/L (ref 3.5–5.1)
Total Bilirubin: 0.2 mg/dL — ABNORMAL LOW (ref 0.3–1.2)
Total Protein: 5.8 g/dL — ABNORMAL LOW (ref 6.0–8.3)

## 2012-08-18 LAB — GLUCOSE, CAPILLARY
Glucose-Capillary: 108 mg/dL — ABNORMAL HIGH (ref 70–99)
Glucose-Capillary: 162 mg/dL — ABNORMAL HIGH (ref 70–99)
Glucose-Capillary: 126 mg/dL — ABNORMAL HIGH (ref 70–99)
Glucose-Capillary: 178 mg/dL — ABNORMAL HIGH (ref 70–99)

## 2012-08-18 MED ORDER — DEXAMETHASONE 4 MG PO TABS
4.0000 mg | ORAL_TABLET | Freq: Two times a day (BID) | ORAL | Status: DC
  Administered 2012-08-18 – 2012-08-19 (×3): 4 mg via ORAL
  Filled 2012-08-18 (×4): qty 1

## 2012-08-18 MED ORDER — KETOROLAC TROMETHAMINE 30 MG/ML IJ SOLN
30.0000 mg | Freq: Once | INTRAMUSCULAR | Status: AC
  Administered 2012-08-18: 30 mg via INTRAVENOUS
  Filled 2012-08-18: qty 1

## 2012-08-18 NOTE — Progress Notes (Signed)
[facility] PROGRESS NOTE  Ryan [name] ZOX:[id number] DOB: [birth year] DOA: 08/14/2012 PCP: Warrick Parisian, MD  Assessment/Plan: Acute encephalopathy -resolved -likely due to uti and PNA Sepsis -Resolved - Required intubation, but extubated after 36 hours -Continue Levaquin po Spinal stenosis -Patient complains of worsening low back pain -07/27/2012 MRI shows moderate impingement L4-5 and L5-S1 -Patient complains of increased leg weakness, However suspect a component of deconditioning -Consult Neurosurgery for opinion UTI-E. Coli -Continue Levaquin Opioid dependence/chronic pain -Add amitriptyline at bedtime -Continue OxyContin 80 mg every 12 hours and oxycodone IR 30 mg every 4 hours when necessary pain -Patient follows with pain management -Continue Cymbalta Diabetes mellitus type 2 -Hemoglobin A1c 9.9 -Continue Lantus 10 units at bedtime with NovoLog sliding scale and make further adjustments Urinary retention -Will need a voiding trial prior to discharge Hypertension -Continue losartan   Family Communication:   Pt at beside Disposition Plan:   SNF when medically stable  ABX:  Vanc 4/18 >> 4/20  Cefepime 4/18 >> 4/21  Levoflox 4/21 >> (5 doses ordered)        Procedures/Studies: Dg Chest 2 View  08/17/2012  *RADIOLOGY REPORT*  Clinical Data: Infiltrates.  Shortness of breath.  CHEST - 2 VIEW  Comparison: 04/20 and 05/04/2012  Findings: Central line tip is at the junction of the left innominate vein and superior vena cava.  Pulmonary vascular congestion has diminished.  Hazy infiltrate at the left base is clearing although some infiltrate/atelectasis persists at the left base.  IMPRESSION: The hazy infiltrate on the right has resolved and probably represented mild pulmonary edema.  Improving infiltrate/atelectasis at the left base.   Original Report Authenticated By: Francene Boyers, M.D.    Ct Head Wo Contrast  08/14/2012  *RADIOLOGY REPORT*  Clinical  Data: Decreased mental status.  Recent fall.  CT HEAD WITHOUT CONTRAST  Technique:  Contiguous axial images were obtained from the base of the skull through the vertex without contrast.  Comparison: None.  Findings: There is mild low attenuation within the subcortical and periventricular white matter consistent with chronic small vessel ischemic change.  There is prominence of the sulci and ventricles consistent with brain atrophy.  There is no evidence for acute brain infarct, hemorrhage or mass.  Polyp versus retention cyst noted in the left maxillary sinus. Mild partial opacification of the sphenoid sinus and left sided ethmoid air cells noted.  The mastoid air cells are clear.  The skull is intact.  IMPRESSION:  1.  No acute intracranial abnormalities. 2.  Mild small vessel ischemic change and brain atrophy.   Original Report Authenticated By: Signa Kell, M.D.    Mr Lumbar Spine W Wo Contrast  07/28/2012  *RADIOLOGY REPORT*  Clinical Data: Low back pain.  Surgery 3 years ago.  MRI LUMBAR SPINE WITHOUT AND WITH CONTRAST  Technique:  Multiplanar and multiecho pulse sequences of the lumbar spine were obtained without and with intravenous contrast.  Contrast: 20mL MULTIHANCE GADOBENATE DIMEGLUMINE 529 MG/ML IV SOLN  BUN and creatinine were obtained on site at [facility] at [address] Results:  BUN 6.0 mg/dL,  Creatinine 0.9 mg/dL.  Comparison: 07/25/2012  Findings: The lowest full intervertebral disk space is labeled L5- S1.  If procedural intervention is to be performed, careful correlation with this numbering strategy is recommended.  The conus medullaris appears unremarkable.  Conus level:  L1. Posterolateral rod pedicle screw fixation noted at L4-5 with interbody cage.  There is 3 mm of fused anterolisthesis at L4-5; no other malalignment  in the lumbar spine is observed.  Type 2 degenerative endplate findings are present at the L3-4.  Complex left kidney lower pole lesion is partially  observed, not definitively enhancing.  Scattered small retroperitoneal lymph nodes are again identified, as on recent abdomen CT from 2 days ago.  Additional findings at individual levels are as follows:  L1-2:  Unremarkable.  L2-3:  Borderline abutment of the right L2 nerve in the lateral extraforaminal space by the underlying disc bulge.  Mild facet arthropathy.  L3-4:  Mild bilateral foraminal stenosis and mild central stenosis secondary to disc bulge, intervertebral spurring, and facet arthropathy.  L4-5:  Moderate bilateral foraminal stenosis secondary to intervertebral spurring and facet spurring.  Posterior decompression noted.  Epidural fibrosis noted throughout much of the thecal sac at this level, and extending in the lateral recesses.  L5-S1:  Moderate bilateral foraminal stenosis secondary to intervertebral and facet spurring.  Diffuse disc bulge noted. Suspected small central disc protrusion.  IMPRESSION:  1.  Lumbar spondylosis and degenerative disc disease cause moderate impingement at L4-5 and L5-S1, and mild impingement L3-4 as detailed above. 2.  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 3.  Complex cyst of the left mid to lower kidney, as shown on prior CT abdomen exams.   Original Report Authenticated By: Gaylyn Rong, M.D.    Ct Abdomen Pelvis W Contrast  07/25/2012  *RADIOLOGY REPORT*  Clinical Data: Progressive abdominal pain.  CT ABDOMEN AND PELVIS WITH CONTRAST  Technique:  Multidetector CT imaging of the abdomen and pelvis was performed following the standard protocol during bolus administration of intravenous contrast.  Contrast: OMNIPAQUE IOHEXOL 300 MG/ML  SOLN  Comparison: 03/28/2012  Findings: Subtle ground-glass densities at the lung bases may represent atelectasis or mild edema.  There is no evidence for free intraperitoneal air.  Stable appearance of liver with mild intrahepatic biliary dilatation and the patient is status post cholecystectomy. Normal appearance  of the spleen and pancreas. There is a low density nodule in the right adrenal gland which measures 1.9 cm and suggestive for an adenoma.  There are two adjacent cysts or a bilobed cyst in the lower pole of the left kidney.  These cannot be characterized as simple cysts based on the Hounsfield units. Suspect that this represents complex or hemorrhagic cysts since these areas were hyperdense on the CT from 03/20/2008.  This area measures 2.8 cm and not significantly changed.  No significant free fluid or lymphadenopathy within the abdomen or pelvis.  Normal appearance of the small and large bowel. Again noted is a small periumbilical hernia containing small bowel.  No evidence for bowel incarceration.  Pedicle screw fixation and interbody fusion at L4-L5.  There is an old anterior left rib fracture.  Stable sclerosis involving the SI joints, left side greater than right.  IMPRESSION: No acute abnormalities within the abdomen or pelvis.  Small periumbilical hernia containing small bowel.  No evidence for bowel incarceration.  Subtle densities at the lung bases could represent atelectasis or mild edema.  Stable lesions in the left kidney lower pole.  Suspect that these represent hemorrhagic or complex cysts.  Minimal change from the previous examination.   Original Report Authenticated By: Richarda Overlie, M.D.    Dg Pelvis Portable  08/14/2012  *RADIOLOGY REPORT*  Clinical Data: Fall.  PORTABLE PELVIS  Comparison: None.  Findings: Mild symmetric degenerative changes in the hips bilaterally.  Postsurgical changes of partially visualized in the lower lumbar spine. No acute bony abnormality.  Specifically,  no fracture, subluxation, or dislocation.  Soft tissues are intact.  IMPRESSION: No acute bony abnormality.   Original Report Authenticated By: Charlett Nose, M.D.    Dg Chest Port 1 View  08/16/2012  *RADIOLOGY REPORT*  Clinical Data: Follow-up respiratory failure.  PORTABLE CHEST - 1 VIEW  Comparison: 08/15/2012   Findings: Interval extubation and removal of NG tube.  Worsening bilateral perihilar and lower lobe airspace opacities, left greater than right.  This represent edema or infection.  Suspect small left effusion.  Heart is upper limits normal in size.  IMPRESSION: Worsening bilateral airspace disease, asymmetric to the left.  This could represent asymmetric edema or infection.  Small left effusion.   Original Report Authenticated By: Charlett Nose, M.D.    Dg Chest Port 1 View  08/15/2012  *RADIOLOGY REPORT*  Clinical Data: Evaluate endotracheal tube.  PORTABLE CHEST - 1 VIEW  Comparison: Prior chest x-ray 08/14/2012  Findings: Endotracheal tube 4.3 cm above the carina.  The left subclavian approach central venous catheter is in unchanged position.  The tip projects over the confluence of the left innominate vein and superior vena cava.  Nasogastric tube is present, the tip is in the stomach.  Stable borderline cardiomegaly.  There is pulmonary vascular congestion bordering on mild interstitial edema.  Slightly enlarged left pleural effusion and associated basilar opacity.  IMPRESSION:  1.  Slightly enlarged left pleural effusion and associated atelectasis versus consolidation 2.  Stable background pulmonary vascular congestion and mild interstitial edema 3.  Stable and satisfactory support apparatus as above   Original Report Authenticated By: Malachy Moan, M.D.    Dg Chest Portable 1 View  08/14/2012  *RADIOLOGY REPORT*  Clinical Data: Central line placement  PORTABLE CHEST - 1 VIEW  Comparison: Prior films same day  Findings: Cardiomediastinal silhouette is stable.  Again noted central vascular congestion and mild interstitial prominence bilaterally.  Left basilar atelectasis or infiltrate.  Stable endotracheal and NG tube position.  There is a left subclavian central line with tip at the junction of SVC with  innominate vein. No diagnostic pneumothorax.  IMPRESSION: Again noted central vascular congestion  and mild interstitial prominence bilaterally.  Left basilar atelectasis or infiltrate. Stable endotracheal and NG tube position.  There is a left subclavian central line with tip at the junction of SVC with innominate vein.  No diagnostic pneumothorax.   Original Report Authenticated By: Natasha Mead, M.D.    Dg Chest Portable 1 View  08/14/2012  *RADIOLOGY REPORT*  Clinical Data: Central line and endotracheal tube placement.  PORTABLE CHEST - 1 VIEW  Comparison: 06/01/2012 and 05/25/2012.  Findings: 1141 hours.  Examination was repeated at 12:19 and 1223 hours.  Endotracheal tube tip is in the mid trachea.  A nasogastric tube is looped in the gastric fundus.  No central venous catheter is identified.  On the initial image, there is left apical lucency. However, the subsequent views show this to be less evident.  No pneumothorax is identified.  There are bilateral perihilar and left lower lobe air space opacities.  There is a possible small left pleural effusion.  The heart size and mediastinal contours are stable for the lesser degree of inspiration.  IMPRESSION:  1.  Endotracheal and nasogastric tubes are satisfactorily positioned.  There is no evidence of central venous catheter within the chest. 2.  Bilateral perihilar and left lower lobe air space opacities. Possible small left pleural effusion.   Original Report Authenticated By: Carey Bullocks, M.D.  Subjective: Patient complains of worsening pain in the lower back as well as leg weakness. Denies bowel or bladder incontinence but did have urinary retention requiring Foley catheter placement. Denies any fevers, chills, chest pain, shortness of breath, nausea, vomiting, abdominal pain, headache, dizziness.  Objective: Filed Vitals:   08/17/12 0447 08/17/12 1415 08/17/12 2138 08/18/12 0521  BP: 141/68 102/54 154/85 158/73  Pulse: 87 97 84 106  Temp: 98.6 F (37 C) 98.1 F (36.7 C) 98.2 F (36.8 C) 98.1 F (36.7 C)  TempSrc: Oral Oral  Oral Oral  Resp: 18 20 16 16   Height:      Weight:      SpO2: 90% 99% 96% 96%    Intake/Output Summary (Last 24 hours) at 08/18/12 1113 Last data filed at 08/18/12 1610  Gross per 24 hour  Intake    480 ml  Output   2325 ml  Net  -1845 ml   Weight change:  Exam:   General:  Pt is alert, follows commands appropriately, not in acute distress  HEENT: No icterus, No thrush,  [place]/AT  Cardiovascular: RRR, S1/S2, no rubs, no gallops  Respiratory: Bibasilar crackles without any wheezing. Good air movement.  Abdomen: Soft/+BS, non tender, non distended, no guarding  Extremities: trace edema, No lymphangitis, No petechiae, No rashes, no synovitis  NEURO--strength 2+/5 LLE, 3-/5RLE, sensation intact in lower extremities  Data Reviewed: Basic Metabolic Panel:  Recent Labs Lab 08/14/12 1100 08/15/12 0420 08/16/12 0535 08/18/12 0825  NA 134* 135 136 135  K 4.9 4.0 3.2* 4.1  CL 98 99 102 101  CO2 22 28 31 30   GLUCOSE 272* 230* 198* 104*  BUN 12 6 5* 8  CREATININE 0.83 0.63 0.60 0.62  CALCIUM 9.4 8.7 8.3* 9.1   Liver Function Tests:  Recent Labs Lab 08/14/12 1100 08/18/12 0825  AST 150* 114*  ALT 44 43  ALKPHOS 196* 77  BILITOT 0.2* 0.2*  PROT 7.4 5.8*  ALBUMIN 3.1* 2.2*   No results found for this basename: LIPASE, AMYLASE,  in the last 168 hours  Recent Labs Lab 08/14/12 1100  AMMONIA 39   CBC:  Recent Labs Lab 08/14/12 1100 08/15/12 0420 08/16/12 0535  WBC 11.9* 9.2 8.8  NEUTROABS 10.2*  --   --   HGB 16.7 15.0 12.7*  HCT 50.4 45.8 39.3  MCV 95.5 94.0 95.6  PLT 196 223 189   Cardiac Enzymes:  Recent Labs Lab 08/14/12 1100 08/14/12 1545 08/14/12 2137 08/15/12 0420  CKTOTAL 96045*  --   --   --   TROPONINI <0.30 <0.30 <0.30 <0.30   BNP: No components found with this basename: POCBNP,  CBG:  Recent Labs Lab 08/17/12 0738 08/17/12 1225 08/17/12 1701 08/17/12 2153 08/18/12 0745  GLUCAP 151* 182* 201* 170* 108*    Recent Results  (from the past 240 hour(s))  CULTURE, BLOOD (ROUTINE X 2)     Status: None   Collection Time    08/14/12 10:40 AM      Result Value Range Status   Specimen Description BLOOD RIGHT ARTERY   Final   Special Requests BOTTLES DRAWN AEROBIC ONLY   Final   Culture  Setup Time 08/14/2012 14:03   Final   Culture     Final   Value:        BLOOD CULTURE RECEIVED NO GROWTH TO DATE CULTURE WILL BE HELD FOR 5 DAYS BEFORE ISSUING A FINAL NEGATIVE REPORT   Report Status PENDING   Incomplete  CULTURE, BLOOD (ROUTINE X 2)     Status: None   Collection Time    08/14/12 11:00 AM      Result Value Range Status   Specimen Description BLOOD CENTRAL LINE   Final   Special Requests BOTTLES DRAWN AEROBIC AND ANAEROBIC   Final   Culture  Setup Time 08/14/2012 14:04   Final   Culture     Final   Value: STAPHYLOCOCCUS SPECIES (COAGULASE NEGATIVE)     Note: THE SIGNIFICANCE OF ISOLATING THIS ORGANISM FROM A SINGLE SET OF BLOOD CULTURES WHEN MULTIPLE SETS ARE DRAWN IS UNCERTAIN. PLEASE NOTIFY THE MICROBIOLOGY DEPARTMENT WITHIN ONE WEEK IF SPECIATION AND SENSITIVITIES ARE REQUIRED.     DIPHTHEROIDS(CORYNEBACTERIUM SPECIES)     Note: Standardized susceptibility testing for this organism is not available.     Note: Gram Stain Report Called to,Read Back By and Verified With: NEELY RICHARDARDSON 08/15/12 @ 1:23PM BY RUSCA.   Report Status 08/16/2012 FINAL   Final  URINE CULTURE     Status: None   Collection Time    08/14/12 11:09 AM      Result Value Range Status   Specimen Description URINE, CLEAN CATCH   Final   Special Requests NONE   Final   Culture  Setup Time 08/14/2012 14:19   Final   Colony Count >=100,000 COLONIES/ML   Final   Culture ESCHERICHIA COLI   Final   Report Status 08/16/2012 FINAL   Final   Organism ID, Bacteria ESCHERICHIA COLI   Final  MRSA PCR SCREENING     Status: None   Collection Time    08/14/12  3:26 PM      Result Value Range Status   MRSA by PCR NEGATIVE  NEGATIVE Final    Comment:            The GeneXpert MRSA Assay (FDA     approved for NASAL specimens     only), is one component of a     comprehensive MRSA colonization     surveillance program. It is not     intended to diagnose MRSA     infection nor to guide or     monitor treatment for     MRSA infections.  CULTURE, RESPIRATORY (NON-EXPECTORATED)     Status: None   Collection Time    08/15/12  9:15 AM      Result Value Range Status   Specimen Description TRACHEAL ASPIRATE   Final   Special Requests Normal   Final   Gram Stain     Final   Value: FEW WBC PRESENT, PREDOMINANTLY PMN     FEW SQUAMOUS EPITHELIAL CELLS PRESENT     RARE YEAST   Culture FEW CANDIDA ALBICANS   Final   Report Status 08/17/2012 FINAL   Final     Scheduled Meds: . ALPRAZolam  1 mg Oral BID  . aspirin EC  81 mg Oral Daily  . DULoxetine  30 mg Oral Daily  . enoxaparin (LOVENOX) injection  75 mg Subcutaneous Q24H  . gabapentin  400 mg Oral TID  . insulin aspart  0-15 Units Subcutaneous TID WC  . insulin aspart  0-5 Units Subcutaneous QHS  . insulin aspart  4 Units Subcutaneous TID WC  . insulin glargine  10 Units Subcutaneous QHS  . lactulose  20 g Oral BID  . levofloxacin  500 mg Oral Daily  . losartan  100 mg Oral Daily  . OxyCODONE  80 mg Oral Q12H  .  pantoprazole  40 mg Oral Daily  . senna-docusate  2 tablet Oral q morning - 10a  . traZODone  100 mg Oral QHS   Continuous Infusions:    Neville Walston, DO  [facility] Pager [phone]  If 7PM-7AM, please contact night-coverage [email or web address] Password [email or web address] 08/18/2012, 11:13 AM   LOS: 4 days

## 2012-08-18 NOTE — Care Management (Signed)
Chart utilization review updated. CM noted patient requiring SNF placement.   Roxy Manns Erice Ahles,RN,BSN [phone]

## 2012-08-18 NOTE — Progress Notes (Addendum)
CSW met with pt at bedside to provide SNF bed offers.  Pt states that he is interested in [facility] as he has been at facility previously and CSW stated that [facility] currently has responded that they are unable to offer a bed, but CSW will contact facility on pt behalf if that is the facility that pt is interested in.  Pt asked CSW to contact facility to inquire if facility will be able to offer a bed.  CSW contacted [facility] and facility plans to re-review the information and determine why the facility was initially not able to offer a bed.   CSW encouraged pt to consider other options for SNF that have been able to offer a bed.  CSW to follow up with pt once [facility] [employer] responds.   CSW to continue to follow.  Addendum 2:00pm:  CSW received notification from [facility] that facility is not able to offer pt a bed.  CSW discussed with pt and pt mother at bedside.  Pt chooses bed at [facility].   CSW notified Malvin Johns of pt and pt family acceptance of bed offer.  Per MD, pt not yet medically stable for discharge.  CSW to continue to follow and facilitate pt discharge needs to [facility] when pt medically stable for discharge.   Jacklynn Lewis, MSW, LCSWA  Clinical Social Work [phone]

## 2012-08-19 LAB — BASIC METABOLIC PANEL
BUN: 13 mg/dL (ref 6–23)
Chloride: 100 mEq/L (ref 96–112)
GFR calc Af Amer: >90 mL/min (ref >90)
GFR calc non Af Amer: >90 mL/min (ref >90)
Glucose, Bld: 251 mg/dL — ABNORMAL HIGH (ref 70–99)
Potassium: 5 mEq/L (ref 3.5–5.1)
Sodium: 134 mEq/L — ABNORMAL LOW (ref 135–145)

## 2012-08-19 LAB — CBC
HCT: 37.7 % — ABNORMAL LOW (ref 39.0–52.0)
Hemoglobin: 11.5 g/dL — ABNORMAL LOW (ref 13.0–17.0)
MCHC: 30.5 g/dL (ref 30.0–36.0)
RBC: 3.89 MIL/uL — ABNORMAL LOW (ref 4.22–5.81)

## 2012-08-19 LAB — GLUCOSE, CAPILLARY
Glucose-Capillary: 199 mg/dL — ABNORMAL HIGH (ref 70–99)
Glucose-Capillary: 188 mg/dL — ABNORMAL HIGH (ref 70–99)

## 2012-08-19 MED ORDER — SODIUM CHLORIDE 0.9 % IV BOLUS (SEPSIS): 1000.0000 mL | Freq: Once | INTRAVENOUS | Status: DC

## 2012-08-19 MED ORDER — ZOLPIDEM TARTRATE 5 MG PO TABS: 5.0000 mg | ORAL_TABLET | Freq: Every evening | ORAL | Status: DC | PRN

## 2012-08-19 MED ORDER — DULOXETINE HCL 30 MG PO CPEP
30.0000 mg | ORAL_CAPSULE | Freq: Once | ORAL | Status: AC
  Administered 2012-08-19: 30 mg via ORAL
  Filled 2012-08-19: qty 1

## 2012-08-19 MED ORDER — DULOXETINE HCL 60 MG PO CPEP: 60.0000 mg | ORAL_CAPSULE | Freq: Every day | ORAL | Status: DC

## 2012-08-19 MED ORDER — OXYCODONE HCL 30 MG PO TABS: 30.0000 mg | ORAL_TABLET | ORAL | Status: DC | PRN

## 2012-08-19 MED ORDER — LEVOFLOXACIN 750 MG PO TABS: 750.0000 mg | ORAL_TABLET | Freq: Every day | ORAL | Status: DC

## 2012-08-19 MED ORDER — OXYCODONE HCL 80 MG PO TB12: 80.0000 mg | ORAL_TABLET | Freq: Two times a day (BID) | ORAL | Status: DC

## 2012-08-19 MED ORDER — DEXAMETHASONE 4 MG PO TABS: 4.0000 mg | ORAL_TABLET | Freq: Two times a day (BID) | ORAL | Status: DC

## 2012-08-19 MED ORDER — VITAMINS A & D EX OINT
TOPICAL_OINTMENT | CUTANEOUS | Status: AC
  Filled 2012-08-19: qty 5

## 2012-08-19 MED ORDER — ALPRAZOLAM 1 MG PO TABS: 1.0000 mg | ORAL_TABLET | Freq: Two times a day (BID) | ORAL | Status: DC

## 2012-08-19 MED ORDER — LEVOFLOXACIN 750 MG PO TABS
750.0000 mg | ORAL_TABLET | Freq: Every day | ORAL | Status: DC
  Administered 2012-08-19: 750 mg via ORAL
  Filled 2012-08-19: qty 1

## 2012-08-19 NOTE — Progress Notes (Signed)
Physical Therapy Treatment Patient Details Name: Ryan [name] MRN: [id number] DOB: [birth year] Today's Date: 08/19/2012 Time: 4098-1191 PT Time Calculation (min): 32 min  PT Assessment / Plan / Recommendation Comments on Treatment Session  Slowly progressing with mobility. Continues to require +2 assist for safety. Able to stand this session, although briefly. Recommend SNF.     Follow Up Recommendations  SNF     Does the patient have the potential to tolerate intense rehabilitation     Barriers to Discharge        Equipment Recommendations  None recommended by PT    Recommendations for Other Services OT consult  Frequency Min 3X/week   Plan      Precautions / Restrictions Precautions Precautions: Fall Precaution Comments: high fall risk.  Restrictions Weight Bearing Restrictions: No   Pertinent Vitals/Pain 7/10 back, legs    Mobility  Bed Mobility Bed Mobility: Supine to Sit;Sit to Supine;Scooting to HOB Supine to Sit: 1: +2 Total assist;HOB elevated;With rails Supine to Sit: Patient Percentage: 50% Sit to Supine: 1: +2 Total assist;HOB elevated;With rail Sit to Supine: Patient Percentage: 50% Scooting to HOB: 4: Min guard;With rail Details for Bed Mobility Assistance: Assist for trunk to upright/supine and bil LEs off/onto bed. Increased time.  Transfers Transfers: Sit to Stand;Stand to Sit Sit to Stand: 4: Min assist;From bed;From elevated surface Stand to Sit: 4: Min assist;To bed;To elevated surface Details for Transfer Assistance: x 5 Sit to stand transitions for strengthening, endurance. Pt able to stand for 10-15 seconds before fatigued/ legs buckle. Recliner back placed directly in front of pt to allow for blocking and upright support for pt in standing.  Ambulation/Gait Ambulation/Gait Assistance: Not tested (comment)    Exercises     PT Diagnosis:    PT Problem List:   PT Treatment Interventions:     PT Goals Acute Rehab PT Goals Pt will go  Supine/Side to Sit: with modified independence PT Goal: Supine/Side to Sit - Progress: Progressing toward goal Pt will go Sit to Supine/Side: with modified independence PT Goal: Sit to Supine/Side - Progress: Progressing toward goal Pt will go Sit to Stand: with mod assist PT Goal: Sit to Stand - Progress: Progressing toward goal Pt will go Stand to Sit: with min assist PT Goal: Stand to Sit - Progress: Progressing toward goal  Visit Information  Last PT Received On: 08/19/12 Assistance Needed: +2    Subjective Data  Subjective: I havent slept in 3 nights Patient Stated Goal: to get better.   Cognition  Cognition Arousal/Alertness: Lethargic Behavior During Therapy: WFL for tasks assessed/performed Overall Cognitive Status: Within Functional Limits for tasks assessed    Balance  Balance Balance Assessed: Yes Static Standing Balance Static Standing - Balance Support: Bilateral upper extremity supported Static Standing - Level of Assistance: 4: Min assist Static Standing - Comment/# of Minutes: 10-15 seconds on each attempt. Bil UE support on back of recliner, placed directy in front of pt.   End of Session PT - End of Session Equipment Utilized During Treatment: Oxygen Activity Tolerance: Patient limited by fatigue;Patient limited by pain Patient left: in bed;with call bell/phone within reach;with family/visitor present   GP     Rebeca Alert, MPT Pager: [phone]

## 2012-08-19 NOTE — Progress Notes (Signed)
Pt for discharge to [facility].  CSW faxed pt discharge information to facility, discussed with pt at bedside, provided RN phone number to call report, and arranged ambulance transportation for pt to [facility].   No further social work needs identified at this time.  CSW signing off.   Jacklynn Lewis, MSW, LCSWA  Clinical Social Work [phone]

## 2012-08-19 NOTE — Progress Notes (Signed)
Pt was stable at time of discharge. EMT took pt's discharge chart and medications that were retrieved from the pharmacy.

## 2012-08-19 NOTE — Discharge Summary (Signed)
Physician Discharge Summary  Ryan [name] ZOX:[id number] DOB: May 05, 1950 DOA: 08/14/2012  PCP: Warrick Parisian, MD  Admit date: 08/14/2012 Discharge date: 08/19/2012  Recommendations for Outpatient Follow-up:  1. Pt will need to follow up with PCP in 2 weeks post discharge 2. Please obtain BMP to evaluate electrolytes and kidney function 3. Please also check CBC to evaluate Hg and Hct levels 4. Follow up with pain management physician regarding adjustments in opioids 5. Follow up with Dr. Jeral Fruit, neurosurgery in one month  Discharge Diagnoses:  Principal Problem:   Acute respiratory failure Active Problems:   DIABETES MELLITUS, TYPE II   HYPERTENSION   CORONARY ARTERY DISEASE   History of DVT (deep vein thrombosis)   Narcotic dependence   Anxiety and depression   Urinary tract infection   Encephalopathy acute   Severe sepsis   Pulmonary infiltrate in left lung on chest x-ray   Chronic pain   Urinary retention   Spinal stenosis of lumbar region  acute respiratory failure -The patient was found to have decreasing mental status in route the hospital by ambulance -On arrival, patient was somnolent, moaning occasionally -Patient was intubated by ED physician -Ultimately admitted by CCM -Patient was weaned and extubated approximately 36 hours after intubation -Patient remained clinically stable after extubation and transferred to telemetry unit  Acute encephalopathy  -Patient experienced a fall at home in the shower -experienced worsening/declining mentation in route to [facility] by ambulance -resolved  -likely due to uti and PNA  Sepsis  -secondary to PNA and UTI -Resolved--WBC count improved, patient remained afebrile and hemodynamically stable. WBC 4.1 on the day of discharge - Required intubation, but extubated after 36 hours  -Continue Levaquin po  Spinal stenosis  -Patient complained of worsening low back pain After he was transferred out of ICU  -May be partly due to the  patient's fall in addition to his immobility and deconditioning  -07/27/2012 MRI shows moderate impingement L4-5 and L5-S1  -Consulted Neurosurgery--spoke with Dr. Jordan Likes on 08/18/12--no surgical intervention needed--trial of steroid may help--followup with Dr. Jeral Fruit in 2-4 weeks -started dexamethasone 4mg  bid x 7 days -also given toradol 30mg  x 1 -above interventions helped alleviated some of the patient's back pain  Pneumonia  -Initial chest x-ray showed bilateral perihilar and left lower lobe opacities -Patient was initially started on vancomycin, cefepime, levofloxacin -After 48-72 hours of intravenous antibiotics, the patient was de-escalated to oral levofloxacin -He did not have any further respiratory distress; remained afebrile and hemodynamically stable -He'll finish 3 additional days of Levaquin as an outpatient UTI-E. Coli  -Continue Levaquin  Opioid dependence/chronic pain  -Continue trazodone  at bedtime  -Continue OxyContin 80 mg every 12 hours and oxycodone IR 30 mg every 4 hours when necessary pain  -Patient follows with pain management  -Continue Cymbalta--dose increased to 60 mg daily  Diabetes mellitus type 2  -Hemoglobin A1c 9.9  -Continue Lantus 10 units at bedtime with NovoLog sliding scale and make further adjustments  -CBGs mildly elevated, 180-200, but the patient was off of his oral hypoglycemics  -He was started on NovoLog 4 units with meals with improvement of his CBGs -He will restart his Apidra at time of discharge -Patient admits to noncompliance with diet at home  -Patient should followup with his primary care provider for future adjustment of his insulin regimen   Hypertension  -Continue losartan   -Controlled during the hospitalization  Insomnia -The patient's back pain improved with the above interventions, he continued to have insomnia -An agreement  was made with the patient to start Ambien 5 mg when necessary sleep for the next 3 days only Family  Communication: Pt at beside  Disposition Plan: SNF when medically stable  ABX:  Vanc 4/18 >> 4/20  Cefepime 4/18 >> 4/21  Levoflox 4/21 >>08/22/12   Discharge Condition: stable   Disposition: SNF  Diet:2000 calorie ADA Wt Readings from Last 3 Encounters:  08/16/12 151.2 kg (333 lb 5.4 oz)  07/08/12 149.687 kg (330 lb)  06/01/12 141.613 kg (312 lb 3.2 oz)    History of present illness:  62 year old male with a history of diabetes mellitus, hypertension, coronary artery disease with 2 stents, spinal stenosis, chronic low back pain, presented after an unwitnessed fall in the shower. There was no syncope.Pt lives with roommate who was on scene but, per EMS, he did not provide much useful history. The patient was found in the shower, but it was unclear how long the patient has been there. At the time of initial evaluation, the patient was alert and talking on the scene, and he actually refused transfer to the emergency department for further evaluation. However patient was unable to get up under his own power, and was ultimately transported by ambulance to the emergency department. In route to the emergency department, the patient experienced a decline in mental status and was essentially obtunded when he arrived in the emergency department. The patient was intubated and critical care medicine was consulted to admit the patient. Initial workup include chest x-ray suggested left basal infiltrate and perihilar infiltrates. The patient was started on broad-spectrum antibiotics with vancomycin, cefepime, Levaquin. Blood cultures were obtained, but they showed coagulase-negative Staphylococcus in one of 2 sets suggested of a contaminant. Urine culture showed Escherichia coli with pyuria. The patient did not require vasopressor support. Serial troponins were negative. Further workup revealed negative Legionella and Streptococcus pneumoniae urinary antigens. CT of the brain was negative. The patient's opioids  and benzodiazepines were initially discontinued due to his obtundation. When the patient was stabilized, extubated, and moved to the telemetry unit, his opioids and benzodiazepines were reinstituted without any changes in his mental status.  His oral hypoglycemics were initially discontinued and the patient was started on NovoLog insulin. When the patient was extubated and moved to the telemetry unit, his opioids were reinstituted. The patient's glycemic control was mildly suboptimal. Lantus 10 units at bedtime was restarted and NovoLog 4 units with meals was added with improved control. At the time of discharge, the patient will be restarting his oral hypoglycemics as well as Apidra and his Lantus 10 units at bedtime.     Consultants: CCM  Discharge Exam: Filed Vitals:   08/19/12 0553  BP: 126/75  Pulse: 84  Temp: 98.6 F (37 C)  Resp: 15   Filed Vitals:   08/18/12 0521 08/18/12 1319 08/18/12 2152 08/19/12 0553  BP: 158/73 126/78 129/68 126/75  Pulse: 106 96 75 84  Temp: 98.1 F (36.7 C) 98.2 F (36.8 C) 97.7 F (36.5 C) 98.6 F (37 C)  TempSrc: Oral Oral Oral Oral  Resp: 16 18 12 15   Height:      Weight:      SpO2: 96% 90% 96% 95%   General: A&O x 3, NAD, pleasant, cooperative Cardiovascular: RRR, no rub, no gallop, no S3 Respiratory: CTAB, no wheeze, no rhonchi Abdomen:soft, nontender, nondistended, positive bowel sounds Extremities: No edema, No lymphangitis, no petechiae  Discharge Instructions  Discharge Orders   Future Orders Complete By Expires  Diet - low sodium heart healthy  As directed     Increase activity slowly  As directed         Medication List    TAKE these medications       ALPRAZolam 1 MG tablet  Commonly known as:  XANAX  Take 1 tablet (1 mg total) by mouth 2 (two) times daily.     aspirin EC 81 MG tablet  Take 81 mg by mouth daily.     dexamethasone 4 MG tablet  Commonly known as:  DECADRON  Take 1 tablet (4 mg total) by mouth  every 12 (twelve) hours.     DULoxetine 60 MG capsule  Commonly known as:  CYMBALTA  Take 1 capsule (60 mg total) by mouth daily.  Start taking on:  08/20/2012     gabapentin 400 MG capsule  Commonly known as:  NEURONTIN  Take 1 capsule (400 mg total) by mouth 3 (three) times daily.     glipiZIDE 10 MG 24 hr tablet  Commonly known as:  GLUCOTROL XL  Take 10 mg by mouth 2 (two) times daily.     HYDROcodone-acetaminophen 5-325 MG per tablet  Commonly known as:  NORCO/VICODIN  Take 1 tablet by mouth every 6 (six) hours as needed for pain.     hyoscyamine 0.125 MG SL tablet  Commonly known as:  LEVSIN SL  Place 1 tablet (0.125 mg total) under the tongue every 6 (six) hours as needed for cramping.     insulin glargine 100 UNIT/ML injection  Commonly known as:  LANTUS  Inject 10 Units into the skin at bedtime.     insulin glulisine 100 UNIT/ML injection  Commonly known as:  APIDRA  Inject 5 Units into the skin 2 (two) times daily. Patient uses twice daily during lunch and dinner .     lactulose 10 GM/15ML solution  Commonly known as:  CHRONULAC  Take 30 mLs (20 g total) by mouth 2 (two) times daily.     levofloxacin 750 MG tablet  Commonly known as:  LEVAQUIN  Take 1 tablet (750 mg total) by mouth daily.     losartan 100 MG tablet  Commonly known as:  COZAAR  Take 100 mg by mouth daily.     metFORMIN 850 MG tablet  Commonly known as:  GLUCOPHAGE  Take 850 mg by mouth 3 (three) times daily.     nystatin 100000 UNIT/ML suspension  Commonly known as:  MYCOSTATIN  Take 5 mLs (500,000 Units total) by mouth 4 (four) times daily. For 4 more days.     ondansetron 8 MG disintegrating tablet  Commonly known as:  ZOFRAN ODT  Take 1 tablet (8 mg total) by mouth every 8 (eight) hours as needed for nausea.     oxyCODONE 80 MG 12 hr tablet  Commonly known as:  OXYCONTIN  Take 1 tablet (80 mg total) by mouth every 12 (twelve) hours.     oxycodone 30 MG immediate release tablet   Commonly known as:  ROXICODONE  Take 1 tablet (30 mg total) by mouth every 4 (four) hours as needed for pain. For breakthrough pain     pantoprazole 40 MG tablet  Commonly known as:  PROTONIX  Take 1 tablet (40 mg total) by mouth daily.     promethazine 25 MG suppository  Commonly known as:  PHENERGAN  Place 1 suppository (25 mg total) rectally every 6 (six) hours as needed for nausea.     sennosides-docusate sodium  8.6-50 MG tablet  Commonly known as:  SENOKOT-S  Take 2 tablets by mouth daily. For constipation     traZODone 100 MG tablet  Commonly known as:  DESYREL  Take 100 mg by mouth at bedtime.     zolpidem 5 MG tablet  Commonly known as:  AMBIEN  Take 1 tablet (5 mg total) by mouth at bedtime as needed for sleep.         The results of significant diagnostics from this hospitalization (including imaging, microbiology, ancillary and laboratory) are listed below for reference.    Significant Diagnostic Studies: Dg Chest 2 View  08/17/2012  *RADIOLOGY REPORT*  Clinical Data: Infiltrates.  Shortness of breath.  CHEST - 2 VIEW  Comparison: 04/20 and 05/04/2012  Findings: Central line tip is at the junction of the left innominate vein and superior vena cava.  Pulmonary vascular congestion has diminished.  Hazy infiltrate at the left base is clearing although some infiltrate/atelectasis persists at the left base.  IMPRESSION: The hazy infiltrate on the right has resolved and probably represented mild pulmonary edema.  Improving infiltrate/atelectasis at the left base.   Original Report Authenticated By: Francene Boyers, M.D.    Ct Head Wo Contrast  08/14/2012  *RADIOLOGY REPORT*  Clinical Data: Decreased mental status.  Recent fall.  CT HEAD WITHOUT CONTRAST  Technique:  Contiguous axial images were obtained from the base of the skull through the vertex without contrast.  Comparison: None.  Findings: There is mild low attenuation within the subcortical and periventricular white  matter consistent with chronic small vessel ischemic change.  There is prominence of the sulci and ventricles consistent with brain atrophy.  There is no evidence for acute brain infarct, hemorrhage or mass.  Polyp versus retention cyst noted in the left maxillary sinus. Mild partial opacification of the sphenoid sinus and left sided ethmoid air cells noted.  The mastoid air cells are clear.  The skull is intact.  IMPRESSION:  1.  No acute intracranial abnormalities. 2.  Mild small vessel ischemic change and brain atrophy.   Original Report Authenticated By: Signa Kell, M.D.    Mr Lumbar Spine W Wo Contrast  07/28/2012  *RADIOLOGY REPORT*  Clinical Data: Low back pain.  Surgery 3 years ago.  MRI LUMBAR SPINE WITHOUT AND WITH CONTRAST  Technique:  Multiplanar and multiecho pulse sequences of the lumbar spine were obtained without and with intravenous contrast.  Contrast: 20mL MULTIHANCE GADOBENATE DIMEGLUMINE 529 MG/ML IV SOLN  BUN and creatinine were obtained on site at [facility] at [address] Results:  BUN 6.0 mg/dL,  Creatinine 0.9 mg/dL.  Comparison: 07/25/2012  Findings: The lowest full intervertebral disk space is labeled L5- S1.  If procedural intervention is to be performed, careful correlation with this numbering strategy is recommended.  The conus medullaris appears unremarkable.  Conus level:  L1. Posterolateral rod pedicle screw fixation noted at L4-5 with interbody cage.  There is 3 mm of fused anterolisthesis at L4-5; no other malalignment in the lumbar spine is observed.  Type 2 degenerative endplate findings are present at the L3-4.  Complex left kidney lower pole lesion is partially observed, not definitively enhancing.  Scattered small retroperitoneal lymph nodes are again identified, as on recent abdomen CT from 2 days ago.  Additional findings at individual levels are as follows:  L1-2:  Unremarkable.  L2-3:  Borderline abutment of the right L2 nerve in the lateral  extraforaminal space by the underlying disc bulge.  Mild facet  arthropathy.  L3-4:  Mild bilateral foraminal stenosis and mild central stenosis secondary to disc bulge, intervertebral spurring, and facet arthropathy.  L4-5:  Moderate bilateral foraminal stenosis secondary to intervertebral spurring and facet spurring.  Posterior decompression noted.  Epidural fibrosis noted throughout much of the thecal sac at this level, and extending in the lateral recesses.  L5-S1:  Moderate bilateral foraminal stenosis secondary to intervertebral and facet spurring.  Diffuse disc bulge noted. Suspected small central disc protrusion.  IMPRESSION:  1.  Lumbar spondylosis and degenerative disc disease cause moderate impingement at L4-5 and L5-S1, and mild impingement L3-4 as detailed above. 2.  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 3.  Complex cyst of the left mid to lower kidney, as shown on prior CT abdomen exams.   Original Report Authenticated By: Gaylyn Rong, M.D.    Ct Abdomen Pelvis W Contrast  07/25/2012  *RADIOLOGY REPORT*  Clinical Data: Progressive abdominal pain.  CT ABDOMEN AND PELVIS WITH CONTRAST  Technique:  Multidetector CT imaging of the abdomen and pelvis was performed following the standard protocol during bolus administration of intravenous contrast.  Contrast: OMNIPAQUE IOHEXOL 300 MG/ML  SOLN  Comparison: 03/28/2012  Findings: Subtle ground-glass densities at the lung bases may represent atelectasis or mild edema.  There is no evidence for free intraperitoneal air.  Stable appearance of liver with mild intrahepatic biliary dilatation and the patient is status post cholecystectomy. Normal appearance of the spleen and pancreas. There is a low density nodule in the right adrenal gland which measures 1.9 cm and suggestive for an adenoma.  There are two adjacent cysts or a bilobed cyst in the lower pole of the left kidney.  These cannot be characterized as simple cysts based on the  Hounsfield units. Suspect that this represents complex or hemorrhagic cysts since these areas were hyperdense on the CT from 03/20/2008.  This area measures 2.8 cm and not significantly changed.  No significant free fluid or lymphadenopathy within the abdomen or pelvis.  Normal appearance of the small and large bowel. Again noted is a small periumbilical hernia containing small bowel.  No evidence for bowel incarceration.  Pedicle screw fixation and interbody fusion at L4-L5.  There is an old anterior left rib fracture.  Stable sclerosis involving the SI joints, left side greater than right.  IMPRESSION: No acute abnormalities within the abdomen or pelvis.  Small periumbilical hernia containing small bowel.  No evidence for bowel incarceration.  Subtle densities at the lung bases could represent atelectasis or mild edema.  Stable lesions in the left kidney lower pole.  Suspect that these represent hemorrhagic or complex cysts.  Minimal change from the previous examination.   Original Report Authenticated By: Richarda Overlie, M.D.    Dg Pelvis Portable  08/14/2012  *RADIOLOGY REPORT*  Clinical Data: Fall.  PORTABLE PELVIS  Comparison: None.  Findings: Mild symmetric degenerative changes in the hips bilaterally.  Postsurgical changes of partially visualized in the lower lumbar spine. No acute bony abnormality.  Specifically, no fracture, subluxation, or dislocation.  Soft tissues are intact.  IMPRESSION: No acute bony abnormality.   Original Report Authenticated By: Charlett Nose, M.D.    Dg Chest Port 1 View  08/16/2012  *RADIOLOGY REPORT*  Clinical Data: Follow-up respiratory failure.  PORTABLE CHEST - 1 VIEW  Comparison: 08/15/2012  Findings: Interval extubation and removal of NG tube.  Worsening bilateral perihilar and lower lobe airspace opacities, left greater than right.  This represent edema or infection.  Suspect small left  effusion.  Heart is upper limits normal in size.  IMPRESSION: Worsening bilateral  airspace disease, asymmetric to the left.  This could represent asymmetric edema or infection.  Small left effusion.   Original Report Authenticated By: Charlett Nose, M.D.    Dg Chest Port 1 View  08/15/2012  *RADIOLOGY REPORT*  Clinical Data: Evaluate endotracheal tube.  PORTABLE CHEST - 1 VIEW  Comparison: Prior chest x-ray 08/14/2012  Findings: Endotracheal tube 4.3 cm above the carina.  The left subclavian approach central venous catheter is in unchanged position.  The tip projects over the confluence of the left innominate vein and superior vena cava.  Nasogastric tube is present, the tip is in the stomach.  Stable borderline cardiomegaly.  There is pulmonary vascular congestion bordering on mild interstitial edema.  Slightly enlarged left pleural effusion and associated basilar opacity.  IMPRESSION:  1.  Slightly enlarged left pleural effusion and associated atelectasis versus consolidation 2.  Stable background pulmonary vascular congestion and mild interstitial edema 3.  Stable and satisfactory support apparatus as above   Original Report Authenticated By: Malachy Moan, M.D.    Dg Chest Portable 1 View  08/14/2012  *RADIOLOGY REPORT*  Clinical Data: Central line placement  PORTABLE CHEST - 1 VIEW  Comparison: Prior films same day  Findings: Cardiomediastinal silhouette is stable.  Again noted central vascular congestion and mild interstitial prominence bilaterally.  Left basilar atelectasis or infiltrate.  Stable endotracheal and NG tube position.  There is a left subclavian central line with tip at the junction of SVC with  innominate vein. No diagnostic pneumothorax.  IMPRESSION: Again noted central vascular congestion and mild interstitial prominence bilaterally.  Left basilar atelectasis or infiltrate. Stable endotracheal and NG tube position.  There is a left subclavian central line with tip at the junction of SVC with innominate vein.  No diagnostic pneumothorax.   Original Report  Authenticated By: Natasha Mead, M.D.    Dg Chest Portable 1 View  08/14/2012  *RADIOLOGY REPORT*  Clinical Data: Central line and endotracheal tube placement.  PORTABLE CHEST - 1 VIEW  Comparison: 06/01/2012 and 05/25/2012.  Findings: 1141 hours.  Examination was repeated at 12:19 and 1223 hours.  Endotracheal tube tip is in the mid trachea.  A nasogastric tube is looped in the gastric fundus.  No central venous catheter is identified.  On the initial image, there is left apical lucency. However, the subsequent views show this to be less evident.  No pneumothorax is identified.  There are bilateral perihilar and left lower lobe air space opacities.  There is a possible small left pleural effusion.  The heart size and mediastinal contours are stable for the lesser degree of inspiration.  IMPRESSION:  1.  Endotracheal and nasogastric tubes are satisfactorily positioned.  There is no evidence of central venous catheter within the chest. 2.  Bilateral perihilar and left lower lobe air space opacities. Possible small left pleural effusion.   Original Report Authenticated By: Carey Bullocks, M.D.      Microbiology: Recent Results (from the past 240 hour(s))  CULTURE, BLOOD (ROUTINE X 2)     Status: None   Collection Time    08/14/12 10:40 AM      Result Value Range Status   Specimen Description BLOOD RIGHT ARTERY   Final   Special Requests BOTTLES DRAWN AEROBIC ONLY   Final   Culture  Setup Time 08/14/2012 14:03   Final   Culture     Final   Value:  BLOOD CULTURE RECEIVED NO GROWTH TO DATE CULTURE WILL BE HELD FOR 5 DAYS BEFORE ISSUING A FINAL NEGATIVE REPORT   Report Status PENDING   Incomplete  CULTURE, BLOOD (ROUTINE X 2)     Status: None   Collection Time    08/14/12 11:00 AM      Result Value Range Status   Specimen Description BLOOD CENTRAL LINE   Final   Special Requests BOTTLES DRAWN AEROBIC AND ANAEROBIC   Final   Culture  Setup Time 08/14/2012 14:04   Final   Culture      Final   Value: STAPHYLOCOCCUS SPECIES (COAGULASE NEGATIVE)     Note: THE SIGNIFICANCE OF ISOLATING THIS ORGANISM FROM A SINGLE SET OF BLOOD CULTURES WHEN MULTIPLE SETS ARE DRAWN IS UNCERTAIN. PLEASE NOTIFY THE MICROBIOLOGY DEPARTMENT WITHIN ONE WEEK IF SPECIATION AND SENSITIVITIES ARE REQUIRED.     DIPHTHEROIDS(CORYNEBACTERIUM SPECIES)     Note: Standardized susceptibility testing for this organism is not available.     Note: Gram Stain Report Called to,Read Back By and Verified With: NEELY RICHARDARDSON 08/15/12 @ 1:23PM BY RUSCA.   Report Status 08/16/2012 FINAL   Final  URINE CULTURE     Status: None   Collection Time    08/14/12 11:09 AM      Result Value Range Status   Specimen Description URINE, CLEAN CATCH   Final   Special Requests NONE   Final   Culture  Setup Time 08/14/2012 14:19   Final   Colony Count >=100,000 COLONIES/ML   Final   Culture ESCHERICHIA COLI   Final   Report Status 08/16/2012 FINAL   Final   Organism ID, Bacteria ESCHERICHIA COLI   Final  MRSA PCR SCREENING     Status: None   Collection Time    08/14/12  3:26 PM      Result Value Range Status   MRSA by PCR NEGATIVE  NEGATIVE Final   Comment:            The GeneXpert MRSA Assay (FDA     approved for NASAL specimens     only), is one component of a     comprehensive MRSA colonization     surveillance program. It is not     intended to diagnose MRSA     infection nor to guide or     monitor treatment for     MRSA infections.  CULTURE, RESPIRATORY (NON-EXPECTORATED)     Status: None   Collection Time    08/15/12  9:15 AM      Result Value Range Status   Specimen Description TRACHEAL ASPIRATE   Final   Special Requests Normal   Final   Gram Stain     Final   Value: FEW WBC PRESENT, PREDOMINANTLY PMN     FEW SQUAMOUS EPITHELIAL CELLS PRESENT     RARE YEAST   Culture FEW CANDIDA ALBICANS   Final   Report Status 08/17/2012 FINAL   Final     Labs: Basic Metabolic Panel:  Recent Labs Lab  08/14/12 1100 08/15/12 0420 08/16/12 0535 08/18/12 0825 08/19/12 0350  NA 134* 135 136 135 134*  K 4.9 4.0 3.2* 4.1 5.0  CL 98 99 102 101 100  CO2 22 28 31 30 28   GLUCOSE 272* 230* 198* 104* 251*  BUN 12 6 5* 8 13  CREATININE 0.83 0.63 0.60 0.62 0.61  CALCIUM 9.4 8.7 8.3* 9.1 9.4   Liver Function Tests:  Recent Labs Lab 08/14/12  1100 08/18/12 0825  AST 150* 114*  ALT 44 43  ALKPHOS 196* 77  BILITOT 0.2* 0.2*  PROT 7.4 5.8*  ALBUMIN 3.1* 2.2*   No results found for this basename: LIPASE, AMYLASE,  in the last 168 hours  Recent Labs Lab 08/14/12 1100  AMMONIA 39   CBC:  Recent Labs Lab 08/14/12 1100 08/15/12 0420 08/16/12 0535 08/19/12 0350  WBC 11.9* 9.2 8.8 4.1  NEUTROABS 10.2*  --   --   --   HGB 16.7 15.0 12.7* 11.5*  HCT 50.4 45.8 39.3 37.7*  MCV 95.5 94.0 95.6 96.9  PLT 196 223 189 236   Cardiac Enzymes:  Recent Labs Lab 08/14/12 1100 08/14/12 1545 08/14/12 2137 08/15/12 0420  CKTOTAL 16109*  --   --   --   TROPONINI <0.30 <0.30 <0.30 <0.30   BNP: No components found with this basename: POCBNP,  CBG:  Recent Labs Lab 08/18/12 0745 08/18/12 1202 08/18/12 1730 08/18/12 2143 08/19/12 0724  GLUCAP 108* 162* 126* 178* 199*    Time coordinating discharge:  Greater than 30 minutes  Signed:  Aanshi Batchelder, DO [facility] Pager: [phone] 08/19/2012, 11:40 AM

## 2012-08-19 NOTE — Progress Notes (Signed)
Called [name] but was never connected to RN to give report. Attempted 5 times.

## 2012-08-20 ENCOUNTER — Other Ambulatory Visit: Payer: Self-pay | Admitting: *Deleted

## 2012-08-20 LAB — CULTURE, BLOOD (ROUTINE X 2): Culture: NO GROWTH

## 2012-08-20 MED ORDER — HYDROCODONE-ACETAMINOPHEN 5-325 MG PO TABS: 1.0000 | ORAL_TABLET | Freq: Four times a day (QID) | ORAL | Status: DC | PRN

## 2012-08-24 ENCOUNTER — Non-Acute Institutional Stay (SKILLED_NURSING_FACILITY): Payer: Medicare Other | Admitting: Internal Medicine

## 2012-08-24 DIAGNOSIS — E119 Type 2 diabetes mellitus without complications: Secondary | ICD-10-CM

## 2012-08-24 DIAGNOSIS — J9600 Acute respiratory failure, unspecified whether with hypoxia or hypercapnia: Secondary | ICD-10-CM

## 2012-08-24 DIAGNOSIS — I10 Essential (primary) hypertension: Secondary | ICD-10-CM

## 2012-08-24 DIAGNOSIS — M48061 Spinal stenosis, lumbar region without neurogenic claudication: Secondary | ICD-10-CM

## 2012-08-24 DIAGNOSIS — G9340 Encephalopathy, unspecified: Secondary | ICD-10-CM

## 2012-08-24 DIAGNOSIS — G8929 Other chronic pain: Secondary | ICD-10-CM

## 2012-08-24 NOTE — Progress Notes (Signed)
Patient ID: Ryan [name], male   DOB: 1950/07/26, 62 y.o.   MRN: [id number]    PCP: Warrick Parisian, MD  Allergies  Allergen Reactions  . Lisinopril Swelling and Rash  . Morphine And Related Swelling and Rash    Chief Complaint: new admit post hospitalization 08/14/12- 08/19/12  History of present illness:   62 year old male patient with history of diabetes mellitus, hypertension, coronary artery disease with 2 stents, spinal stenosis, chronic low back pain was admitted to the hospital post fall. In route to the emergency department, the patient experienced a decline in mental status and was obtunded when he arrived in the emergency department. The patient was intubated and critical care medicine was consulted to admit the patient. Initial workup included chest x-ray suggestive of left basal infiltrate and perihilar infiltrates. The patient was started on broad-spectrum antibiotics with vancomycin, cefepime, Levaquin. Blood cultures were obtained, but they showed coagulase-negative Staphylococcus in one of 2 sets (contaminant). Urine culture showed Escherichia coli with pyuria. The patient did not require vasopressor support. Serial troponins were negative. Further workup revealed negative Legionella and Streptococcus pneumoniae urinary antigens. CT of the brain was negative. The patient's opioids and benzodiazepines were initially discontinued due to his obtundation. When the patient was stabilized, extubated, and moved to the telemetry unit, his opioids and benzodiazepines were reinstituted without any changes in his mental status.  he was found to have worsening low back pain and weakness. Neurosurgery was consulted and a trial of steroid was given. No surgical intervention recommended. Given his weakness, he was sent to SNF for STR  Review of Systems:  Review of Systems  Constitutional: Negative for fever, chills and diaphoresis.  HENT: Negative for congestion.   Eyes: Negative for blurred  vision and double vision.  Respiratory: Negative for cough and shortness of breath.   Cardiovascular: Negative for chest pain and palpitations.  Gastrointestinal: Negative for heartburn, vomiting and abdominal pain.  Genitourinary: Negative for dysuria.  Musculoskeletal: Positive for back pain.  Skin: Negative for rash.  Neurological: Positive for weakness. Negative for dizziness, tremors and headaches.  Psychiatric/Behavioral: Negative for depression. The patient does not have insomnia.     Past Medical History  Diagnosis Date  . Hypertension   . DVT (deep venous thrombosis) ~ 2011    BLE  . 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 Hyperlipidemia   . CAD (coronary atherosclerotic disease)     s/p stents x2  . Small bowel obstruction   . Pancreatitis   . Anxiety   . GERD (gastroesophageal reflux disease)   . CHF (congestive heart failure)   . Myocardial infarction ~ 2009  . Anginal pain   . Pneumonia ~ 2012  . Shortness of breath     "lying down & w/exertion" (03/04/2012)  . Type II diabetes mellitus   . Hep B w/o coma 1970's    "I was in [place]" (03/04/2012)  . Chronic lower back pain   . PTSD (post-traumatic stress disorder)    Past Surgical History  Procedure Laterality Date  . Esophagogastroduodenoscopy  09/22/2011    Procedure: ESOPHAGOGASTRODUODENOSCOPY (EGD);  Surgeon: Charna Elizabeth, MD;  Location: [facility] ENDOSCOPY;  Service: Endoscopy;  Laterality: N/A;  . Esophagogastroduodenoscopy  01/05/2012    Procedure: ESOPHAGOGASTRODUODENOSCOPY (EGD);  Surgeon: Rachael Fee, MD;  Location: [facility] ENDOSCOPY;  Service: Endoscopy;  Laterality: N/A;  . Cholecystectomy  ~ 2009  . Spinal growth rods  ~ 2011  . Tonsillectomy and adenoidectomy    .  Coronary angioplasty with stent placement  ~ 2009`    "1 + ! (after 1st one failed)" (03/04/2012)   Social History:   reports that he has been smoking Cigarettes.  He has a 19 pack-year smoking history. He has never used  smokeless tobacco. He reports that  drinks alcohol. He reports that he uses illicit drugs (Marijuana).  Family History  Problem Relation Age of Onset  . Diabetes Father   . Colon cancer Neg Hx     Medications: Patient's Medications  New Prescriptions   No medications on file  Previous Medications   ALPRAZOLAM (XANAX) 1 MG TABLET    Take 1 tablet (1 mg total) by mouth 2 (two) times daily.   ASPIRIN EC 81 MG TABLET    Take 81 mg by mouth daily.   DEXAMETHASONE (DECADRON) 4 MG TABLET    Take 1 tablet (4 mg total) by mouth every 12 (twelve) hours.   DULOXETINE (CYMBALTA) 60 MG CAPSULE    Take 1 capsule (60 mg total) by mouth daily.   GABAPENTIN (NEURONTIN) 400 MG CAPSULE    Take 1 capsule (400 mg total) by mouth 3 (three) times daily.   GLIPIZIDE (GLUCOTROL XL) 10 MG 24 HR TABLET    Take 10 mg by mouth 2 (two) times daily.   HYDROCODONE-ACETAMINOPHEN (NORCO/VICODIN) 5-325 MG PER TABLET    Take 1 tablet by mouth every 6 (six) hours as needed for pain.   HYOSCYAMINE (LEVSIN SL) 0.125 MG SL TABLET    Place 1 tablet (0.125 mg total) under the tongue every 6 (six) hours as needed for cramping.   INSULIN GLARGINE (LANTUS) 100 UNIT/ML INJECTION    Inject 10 Units into the skin at bedtime.   INSULIN GLULISINE (APIDRA) 100 UNIT/ML INJECTION    Inject 5 Units into the skin 2 (two) times daily. Patient uses twice daily during lunch and dinner .   LACTULOSE (CHRONULAC) 10 GM/15ML SOLUTION    Take 30 mLs (20 g total) by mouth 2 (two) times daily.   LEVOFLOXACIN (LEVAQUIN) 750 MG TABLET    Take 1 tablet (750 mg total) by mouth daily.   LOSARTAN (COZAAR) 100 MG TABLET    Take 100 mg by mouth daily.   METFORMIN (GLUCOPHAGE) 850 MG TABLET    Take 850 mg by mouth 3 (three) times daily.   NYSTATIN (MYCOSTATIN) 100000 UNIT/ML SUSPENSION    Take 5 mLs (500,000 Units total) by mouth 4 (four) times daily. For 4 more days.   ONDANSETRON (ZOFRAN ODT) 8 MG DISINTEGRATING TABLET    Take 1 tablet (8 mg total) by mouth  every 8 (eight) hours as needed for nausea.   OXYCODONE (OXYCONTIN) 80 MG 12 HR TABLET    Take 1 tablet (80 mg total) by mouth every 12 (twelve) hours.   OXYCODONE (ROXICODONE) 30 MG IMMEDIATE RELEASE TABLET    Take 1 tablet (30 mg total) by mouth every 4 (four) hours as needed for pain. For breakthrough pain   PANTOPRAZOLE (PROTONIX) 40 MG TABLET    Take 1 tablet (40 mg total) by mouth daily.   PROMETHAZINE (PHENERGAN) 25 MG SUPPOSITORY    Place 1 suppository (25 mg total) rectally every 6 (six) hours as needed for nausea.   SENNOSIDES-DOCUSATE SODIUM (SENOKOT-S) 8.6-50 MG TABLET    Take 2 tablets by mouth daily. For constipation   TRAZODONE (DESYREL) 100 MG TABLET    Take 100 mg by mouth at bedtime.  Modified Medications   Modified Medication Previous Medication  ZOLPIDEM (AMBIEN) 5 MG TABLET zolpidem (AMBIEN) 5 MG tablet      Take 1 tablet every night at bedtime as needed for sleep    Take 1 tablet (5 mg total) by mouth at bedtime as needed for sleep.  Discontinued Medications   No medications on file   Physical Exam:  Filed Vitals:   08/24/12 1648  BP: 138/74  Pulse: 74  Temp: 97.2 F (36.2 C)  Resp: 16  SpO2: 95%   Physical Exam  Constitutional: He is oriented to person, place, and time and well-developed, well-nourished, and in no distress. No distress.  HENT:  Head: Normocephalic and atraumatic.  Mouth/Throat: Oropharynx is clear and moist.  Eyes: Pupils are equal, round, and reactive to light.  Neck: Normal range of motion. Neck supple.  Cardiovascular: Normal rate and regular rhythm.   Pulmonary/Chest: Effort normal and breath sounds normal.  Abdominal: Soft. Bowel sounds are normal.  Musculoskeletal: Normal range of motion. He exhibits no edema and no tenderness.  Weakness present, using wheelchair and walker  Neurological: He is alert and oriented to person, place, and time.  Skin: Skin is warm and dry. He is not diaphoretic.  Stage 2 ulcer in posterior helix of  left ear, dry skin on legs  Psychiatric: Affect and judgment normal.    Labs reviewed: Basic Metabolic Panel:  Recent Labs  32/44/01 0543  03/05/12 0816  05/05/12 0630  06/01/12 0555 06/01/12 0944  08/16/12 0535 08/18/12 0825 08/19/12 0350  NA 135  < > 137  < > 139  < > 140  --   < > 136 135 134*  K 4.0  < > 3.4*  < > 4.0  < > 3.4*  --   < > 3.2* 4.1 5.0  CL 103  < > 99  < > 104  < > 99  --   < > 102 101 100  CO2 23  < > 29  < > 22  < > 29  --   < > 31 30 28   GLUCOSE 210*  < > 137*  < > 239*  < > 316*  --   < > 198* 104* 251*  BUN 10  < > 10  < > 10  < > 9  --   < > 5* 8 13  CREATININE 0.72  < > 0.77  < > 0.74  < > 0.68  0.66  --   < > 0.60 0.62 0.61  CALCIUM 9.1  < > 9.1  < > 9.0  < > 10.3  --   < > 8.3* 9.1 9.4  MG 2.0  --  2.0  --  1.9  --   --  1.9  --   --   --   --   PHOS 2.6  --   --   --   --   --  3.1  --   --   --   --   --   < > = values in this interval not displayed. Liver Function Tests:  Recent Labs  06/01/12 0944 08/14/12 1100 08/18/12 0825  AST 13 150* 114*  ALT 6 44 43  ALKPHOS 84 196* 77  BILITOT 0.4 0.2* 0.2*  PROT 8.0 7.4 5.8*  ALBUMIN 4.0 3.1* 2.2*    Recent Labs  12/20/11 1536  05/25/12 0800 06/01/12 0555 07/25/12 1155  LIPASE 30  < > 15 23 15   AMYLASE 50  --   --   --   --   < > =  values in this interval not displayed.  Recent Labs  08/14/12 1100  AMMONIA 39   CBC:  Recent Labs  06/01/12 0555  07/25/12 1155 08/14/12 1100 08/15/12 0420 08/16/12 0535 08/19/12 0350  WBC 7.8  < > 6.0 11.9* 9.2 8.8 4.1  NEUTROABS 5.2  --  3.1 10.2*  --   --   --   HGB 17.2*  < > 15.7 16.7 15.0 12.7* 11.5*  HCT 50.6  < > 46.1 50.4 45.8 39.3 37.7*  MCV 94.6  < > 91.7 95.5 94.0 95.6 96.9  PLT 247  < > 207 196 223 189 236  < > = values in this interval not displayed. Cardiac Enzymes:  Recent Labs  08/14/12 1100 08/14/12 1545 08/14/12 2137 08/15/12 0420  CKTOTAL 16109*  --   --   --   TROPONINI <0.30 <0.30 <0.30 <0.30   BNP: No  components found with this basename: POCBNP,  CBG:  Recent Labs  08/18/12 2143 08/19/12 0724 08/19/12 1131  GLUCAP 178* 199* 188*    Radiological Exams: Significant Diagnostic Studies: Dg Chest 2 View  08/17/2012  *RADIOLOGY REPORT*  Clinical Data: Infiltrates.  Shortness of breath.  CHEST - 2 VIEW  Comparison: 04/20 and 05/04/2012  Findings: Central line tip is at the junction of the left innominate vein and superior vena cava.  Pulmonary vascular congestion has diminished.  Hazy infiltrate at the left base is clearing although some infiltrate/atelectasis persists at the left base.  IMPRESSION: The hazy infiltrate on the right has resolved and probably represented mild pulmonary edema.  Improving infiltrate/atelectasis at the left base.   Original Report Authenticated By: Francene Boyers, M.D.    Ct Head Wo Contrast  08/14/2012  *RADIOLOGY REPORT*  Clinical Data: Decreased mental status.  Recent fall.  CT HEAD WITHOUT CONTRAST  Technique:  Contiguous axial images were obtained from the base of the skull through the vertex without contrast.  Comparison: None.  Findings: There is mild low attenuation within the subcortical and periventricular white matter consistent with chronic small vessel ischemic change.  There is prominence of the sulci and ventricles consistent with brain atrophy.  There is no evidence for acute brain infarct, hemorrhage or mass.  Polyp versus retention cyst noted in the left maxillary sinus. Mild partial opacification of the sphenoid sinus and left sided ethmoid air cells noted.  The mastoid air cells are clear.  The skull is intact.  IMPRESSION:  1.  No acute intracranial abnormalities. 2.  Mild small vessel ischemic change and brain atrophy.   Original Report Authenticated By: Signa Kell, M.D.    Mr Lumbar Spine W Wo Contrast  07/28/2012  *RADIOLOGY REPORT*  Clinical Data: Low back pain.  Surgery 3 years ago.  MRI LUMBAR SPINE WITHOUT AND WITH CONTRAST  Technique:   Multiplanar and multiecho pulse sequences of the lumbar spine were obtained without and with intravenous contrast.  Contrast: 20mL MULTIHANCE GADOBENATE DIMEGLUMINE 529 MG/ML IV SOLN  BUN and creatinine were obtained on site at [facility] at [address] Results:  BUN 6.0 mg/dL,  Creatinine 0.9 mg/dL.  Comparison: 07/25/2012  Findings: The lowest full intervertebral disk space is labeled L5- S1.  If procedural intervention is to be performed, careful correlation with this numbering strategy is recommended.  The conus medullaris appears unremarkable.  Conus level:  L1. Posterolateral rod pedicle screw fixation noted at L4-5 with interbody cage.  There is 3 mm of fused anterolisthesis at L4-5; no other malalignment in the  lumbar spine is observed.  Type 2 degenerative endplate findings are present at the L3-4.  Complex left kidney lower pole lesion is partially observed, not definitively enhancing.  Scattered small retroperitoneal lymph nodes are again identified, as on recent abdomen CT from 2 days ago.  Additional findings at individual levels are as follows:  L1-2:  Unremarkable.  L2-3:  Borderline abutment of the right L2 nerve in the lateral extraforaminal space by the underlying disc bulge.  Mild facet arthropathy.  L3-4:  Mild bilateral foraminal stenosis and mild central stenosis secondary to disc bulge, intervertebral spurring, and facet arthropathy.  L4-5:  Moderate bilateral foraminal stenosis secondary to intervertebral spurring and facet spurring.  Posterior decompression noted.  Epidural fibrosis noted throughout much of the thecal sac at this level, and extending in the lateral recesses.  L5-S1:  Moderate bilateral foraminal stenosis secondary to intervertebral and facet spurring.  Diffuse disc bulge noted. Suspected small central disc protrusion.  IMPRESSION:  1.  Lumbar spondylosis and degenerative disc disease cause moderate impingement at L4-5 and L5-S1, and mild impingement L3-4 as  detailed above. 2.  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 3.  Complex cyst of the left mid to lower kidney, as shown on prior CT abdomen exams.  Original Report Authenticated By: Gaylyn Rong, M.D.    Ct Abdomen Pelvis W Contrast  07/25/2012  *RADIOLOGY REPORT*  Clinical Data: Progressive abdominal pain.  CT ABDOMEN AND PELVIS WITH CONTRAST  Technique:  Multidetector CT imaging of the abdomen and pelvis was performed following the standard protocol during bolus administration of intravenous contrast.  Contrast: OMNIPAQUE IOHEXOL 300 MG/ML  SOLN  Comparison: 03/28/2012  Findings: Subtle ground-glass densities at the lung bases may represent atelectasis or mild edema.  There is no evidence for free intraperitoneal air.  Stable appearance of liver with mild intrahepatic biliary dilatation and the patient is status post cholecystectomy. Normal appearance of the spleen and pancreas. There is a low density nodule in the right adrenal gland which measures 1.9 cm and suggestive for an adenoma.  There are two adjacent cysts or a bilobed cyst in the lower pole of the left kidney.  These cannot be characterized as simple cysts based on the Hounsfield units. Suspect that this represents complex or hemorrhagic cysts since these areas were hyperdense on the CT from 03/20/2008.  This area measures 2.8 cm and not significantly changed.  No significant free fluid or lymphadenopathy within the abdomen or pelvis.  Normal appearance of the small and large bowel. Again noted is a small periumbilical hernia containing small bowel.  No evidence for bowel incarceration.  Pedicle screw fixation and interbody fusion at L4-L5.  There is an old anterior left rib fracture.  Stable sclerosis involving the SI joints, left side greater than right.  IMPRESSION: No acute abnormalities within the abdomen or pelvis.  Small periumbilical hernia containing small bowel.  No evidence for bowel incarceration.  Subtle densities at the  lung bases could represent atelectasis or mild edema.  Stable lesions in the left kidney lower pole.  Suspect that these represent hemorrhagic or complex cysts.  Minimal change from the previous examination.   Original Report Authenticated By: Richarda Overlie, M.D.    Dg Pelvis Portable  08/14/2012  *RADIOLOGY REPORT*  Clinical Data: Fall.  PORTABLE PELVIS  Comparison: None.  Findings: Mild symmetric degenerative changes in the hips bilaterally.  Postsurgical changes of partially visualized in the lower lumbar spine. No acute bony abnormality.  Specifically, no fracture, subluxation,  or dislocation.  Soft tissues are intact.  IMPRESSION: No acute bony abnormality.   Original Report Authenticated By: Charlett Nose, M.D.    Dg Chest Port 1 View  08/16/2012  *RADIOLOGY REPORT*  Clinical Data: Follow-up respiratory failure.  PORTABLE CHEST - 1 VIEW  Comparison: 08/15/2012  Findings: Interval extubation and removal of NG tube.  Worsening bilateral perihilar and lower lobe airspace opacities, left greater than right.  This represent edema or infection.  Suspect small left effusion.  Heart is upper limits normal in size.  IMPRESSION: Worsening bilateral airspace disease, asymmetric to the left.  This could represent asymmetric edema or infection.  Small left effusion.   Original Report Authenticated By: Charlett Nose, M.D.    Dg Chest Port 1 View  08/15/2012  *RADIOLOGY REPORT*  Clinical Data: Evaluate endotracheal tube.  PORTABLE CHEST - 1 VIEW  Comparison: Prior chest x-ray 08/14/2012  Findings: Endotracheal tube 4.3 cm above the carina.  The left subclavian approach central venous catheter is in unchanged position.  The tip projects over the confluence of the left innominate vein and superior vena cava.  Nasogastric tube is present, the tip is in the stomach.  Stable borderline cardiomegaly.  There is pulmonary vascular congestion bordering on mild interstitial edema.  Slightly enlarged left pleural effusion and  associated basilar opacity.  IMPRESSION:  1.  Slightly enlarged left pleural effusion and associated atelectasis versus consolidation 2.  Stable background pulmonary vascular congestion and mild interstitial edema 3.  Stable and satisfactory support apparatus as above   Original Report Authenticated By: Malachy Moan, M.D.    Dg Chest Portable 1 View  08/14/2012  *RADIOLOGY REPORT*  Clinical Data: Central line placement  PORTABLE CHEST - 1 VIEW  Comparison: Prior films same day  Findings: Cardiomediastinal silhouette is stable.  Again noted central vascular congestion and mild interstitial prominence bilaterally.  Left basilar atelectasis or infiltrate.  Stable endotracheal and NG tube position.  There is a left subclavian central line with tip at the junction of SVC with  innominate vein. No diagnostic pneumothorax.  IMPRESSION: Again noted central vascular congestion and mild interstitial prominence bilaterally.  Left basilar atelectasis or infiltrate. Stable endotracheal and NG tube position.  There is a left subclavian central line with tip at the junction of SVC with innominate vein.  No diagnostic pneumothorax.   Original Report Authenticated By: Natasha Mead, M.D.    Dg Chest Portable 1 View  08/14/2012  *RADIOLOGY REPORT*  Clinical Data: Central line and endotracheal tube placement.  PORTABLE CHEST - 1 VIEW  Comparison: 06/01/2012 and 05/25/2012.  Findings: 1141 hours.  Examination was repeated at 12:19 and 1223 hours.  Endotracheal tube tip is in the mid trachea.  A nasogastric tube is looped in the gastric fundus.  No central venous catheter is identified.  On the initial image, there is left apical lucency. However, the subsequent views show this to be less evident.  No pneumothorax is identified.  There are bilateral perihilar and left lower lobe air space opacities.  There is a possible small left pleural effusion.  The heart size and mediastinal contours are stable for the lesser degree of  inspiration.  IMPRESSION:  1.  Endotracheal and nasogastric tubes are satisfactorily positioned.  There is no evidence of central venous catheter within the chest. 2.  Bilateral perihilar and left lower lobe air space opacities. Possible small left pleural effusion.   Original Report Authenticated By: Carey Bullocks, M.D.      Assessment/Plan  acute  respiratory failure- in setting of pneumonia, required intubation  Acute encephalopathy- in setting of sepsis (uti and pneumonia), improved. Monitor wbc and temp curve. Completed course of levaquin 08/22/12  Spinal stenosis- imporved with course of prednsione and being medically managed at present. Continue decadron 4 mg bid. Working with PT/OT. Will need fall precuations and has follow up with Dr. Jeral Fruit in 2-4 weeks   chronic pain- Continue trazodone  at bedtime, norco 5-325 q6h prn, hyoscyamine 0.125 mg q6h prn for muscle spasm and OxyContin 80 mg every 12 hours and oxycodone IR 30 mg every 4 hours when necessary pain. Also to continue Cymbalta 60 mg daily and gabapentin 400 mg tid  Diabetes mellitus type 2 - recent Hemoglobin A1c 9.9. Continue Lantus 10 units at bedtime with NovoLog sliding scale, glipizide 10 mg bid, metformin 850 mg tid and monitor cbg, will continue apidra. On gabapentin to help with neuropathy  Hypertension- bp well controlled. Continue losartan and asa  Insomnia- with anxiety. underc ontrol and will continue xanax 1 mg bid for now. Also to continue [place] current regimen   Family/ staff Communication: reviewed care plan with nursing supervisor and patient   Goals of care: rehab, fall prevention   Labs/tests ordered- cbc, bmp

## 2012-08-27 ENCOUNTER — Encounter: Payer: Self-pay | Admitting: Nurse Practitioner

## 2012-08-27 ENCOUNTER — Other Ambulatory Visit: Payer: Self-pay | Admitting: *Deleted

## 2012-08-27 MED ORDER — ZOLPIDEM TARTRATE 5 MG PO TABS: ORAL_TABLET | ORAL | Status: DC

## 2012-09-03 ENCOUNTER — Other Ambulatory Visit: Payer: Self-pay | Admitting: Geriatric Medicine

## 2012-09-03 MED ORDER — OXYCODONE HCL ER 60 MG PO T12A: 60.0000 mg | EXTENDED_RELEASE_TABLET | Freq: Three times a day (TID) | ORAL | Status: DC

## 2012-09-14 ENCOUNTER — Encounter: Payer: Self-pay | Admitting: Nurse Practitioner

## 2012-09-14 NOTE — Progress Notes (Signed)
  Subjective:    Patient ID: Ryan [name], male    DOB: May 10, 1950, 62 y.o.   MRN: [id number]  HPI    Review of Systems     Objective:   Physical Exam        Assessment & Plan:   This encounter was created in error - please disregard.

## 2012-09-22 ENCOUNTER — Other Ambulatory Visit: Payer: Self-pay | Admitting: Geriatric Medicine

## 2012-09-22 ENCOUNTER — Encounter: Payer: Self-pay | Admitting: Nurse Practitioner

## 2012-09-22 MED ORDER — ALPRAZOLAM 1 MG PO TABS: 1.0000 mg | ORAL_TABLET | Freq: Two times a day (BID) | ORAL | Status: DC

## 2012-09-25 ENCOUNTER — Encounter: Payer: Self-pay | Admitting: Nurse Practitioner

## 2012-09-25 ENCOUNTER — Non-Acute Institutional Stay (SKILLED_NURSING_FACILITY): Payer: Medicare Other | Admitting: Nurse Practitioner

## 2012-09-25 DIAGNOSIS — F1520 Other stimulant dependence, uncomplicated: Secondary | ICD-10-CM

## 2012-09-25 DIAGNOSIS — M48061 Spinal stenosis, lumbar region without neurogenic claudication: Secondary | ICD-10-CM

## 2012-09-25 DIAGNOSIS — F15282 Other stimulant dependence with stimulant-induced sleep disorder: Secondary | ICD-10-CM

## 2012-09-25 DIAGNOSIS — F19982 Other psychoactive substance use, unspecified with psychoactive substance-induced sleep disorder: Secondary | ICD-10-CM

## 2012-09-25 DIAGNOSIS — E1142 Type 2 diabetes mellitus with diabetic polyneuropathy: Secondary | ICD-10-CM

## 2012-09-25 DIAGNOSIS — E1149 Type 2 diabetes mellitus with other diabetic neurological complication: Secondary | ICD-10-CM

## 2012-09-25 DIAGNOSIS — G8929 Other chronic pain: Secondary | ICD-10-CM

## 2012-09-25 DIAGNOSIS — G4700 Insomnia, unspecified: Secondary | ICD-10-CM

## 2012-09-25 NOTE — Progress Notes (Signed)
Subjective:    Patient ID: Ryan [name], male    DOB: 24-Jul-1950, 62 y.o.   MRN: [id number]  HPI Comments: I was asked to see pt today for complaints of insomnia, elevated cbg's, and chronic pain. 1) Insomnia - He takes Xanax 1 mg bid Ambien 5 mg po qhs prn and Trazodone 100 mg po qhs.  He asks for Ambien nightly. He says that he drinks lots of coffee and usually drinks several cups from morning to night. He says that he still has trouble staying asleep even after taking the Ambien.   2) Elevated CBG's - Pt is on Lantus 20 u sq at hs, Apidra 5mg  sq bid, Metformin 850mg  tid, Glucotrol xl 10mg  bid. He is also taking Decadron 4mg  q 12hrs for spinal stenosis, per d/c summary Neuro consult with Dr. Jeral Fruit. He has been on this for over a month, but the duration was intended to be for a total of six days per d/c summary note and prescription on file at pharmacy. Cbg ranges 200 - 400. Last hgb a1c 9.9 on 09/17/12.  3) Chronic pain - Pt has a pain contract with Dr. Jordan Likes at [facility]. He is being treated for chronic low back pain and lower extremity pain and is s/p failed back surgery/post laminectomy syndrome. He is taking Oxycodone 30mg  po q 4hrs prn, Oxycontin 60mg  po q 8 hrs, Neurontin 400mg  po tid. He has prn Vicodin which he does not take, he states it does not work for him. He says that he had an injection in his hip on his last visit with Dr. Jordan Likes. He says he did not tolerate that injection. He is taking the prn Oxy Ir regularly. He has a follow up appointment 10/11/12.    The following portions of the patient's history were reviewed and updated as appropriate: allergies, current medications, past family history, past medical history, past social history, past surgical history and problem list.   Review of Systems  Constitutional: Positive for diaphoresis.       Pt states he sweats after drinking coffee.  HENT: Negative.   Eyes: Negative.   Respiratory: Negative.    Cardiovascular: Negative.   Gastrointestinal: Negative.   Endocrine: Negative.   Genitourinary: Negative.   Musculoskeletal: Positive for back pain, arthralgias and gait problem.  Skin: Negative.   Allergic/Immunologic: Negative.   Neurological: Negative.   Hematological: Negative.   Psychiatric/Behavioral: Positive for sleep disturbance.   LABS 09/17/12: wbc 8.4, hgb 13.4, hct 41.3, plt 179, na 133, k 4.7, glucose 380, bun 21, creatinine 0.71, calcium 9.7, hgb a1c 9.9.     Objective:   Physical Exam  Vitals reviewed. Constitutional: He is oriented to person, place, and time. He appears well-developed and well-nourished.  Cardiovascular: Normal rate and regular rhythm.   Pulmonary/Chest: Effort normal and breath sounds normal.  Musculoskeletal: Normal range of motion.       Lumbar back: He exhibits pain.  Chronic pain.  Neurological: He is alert and oriented to person, place, and time. Gait abnormal.  Skin: Skin is warm and dry.  Psychiatric: He has a normal mood and affect.       Assessment & Plan:  1) d/c Vicodin - not working per pt. 2) d/c Decadron, was only supposed to take for six days, continue to monitor cbg's ac/hs. I expect the cbg's to stabilize with removal of decadron. 3) Needs follow up with Dr. Jeral Fruit neuro surgery per d/c summary. 4) To help with sleep hygeine, limit  coffee to 2 cups per day and substitute with decaf hot tea and water. Will not change Ambien or Trazodone doses at this time. Will try Melatonin 3mg  po qhs for sleep.  5) No acute changes or trauma. Will not change pain meds. Pt has pain contract with Dr. Jordan Likes. I encouraged pt to stick to contract terms and discuss sx and changes with her at follow up.  6) Xanax for anxiety is currently being given at 9am and 5pm. Change Xanax admin times to 9am and 9pm to help facilitate sleep at bedtime.

## 2012-09-28 ENCOUNTER — Non-Acute Institutional Stay (SKILLED_NURSING_FACILITY): Payer: Medicare Other | Admitting: Internal Medicine

## 2012-09-28 DIAGNOSIS — H11149 Conjunctival xerosis, unspecified, unspecified eye: Secondary | ICD-10-CM

## 2012-09-28 DIAGNOSIS — M25571 Pain in right ankle and joints of right foot: Secondary | ICD-10-CM

## 2012-09-28 DIAGNOSIS — E507 Other ocular manifestations of vitamin A deficiency: Secondary | ICD-10-CM

## 2012-09-28 DIAGNOSIS — M25579 Pain in unspecified ankle and joints of unspecified foot: Secondary | ICD-10-CM

## 2012-09-28 NOTE — Progress Notes (Signed)
Patient ID: Ryan [name], male   DOB: 1950/11/30, 62 y.o.   MRN: [id number]  AV- right eye pain, right foot pain  History of present illness:   62 year old male patient with history of diabetes mellitus, hypertension, coronary artery disease with 2 stents, spinal stenosis, chronic low back pain is here for STR due to his pain and weakness. He was seen today with complaints of pain and discomfort in his right eye. He feels dry, gritty sensation in his right eye with some watering. Denies any vision change. No discomfort in left eye. This morning on getting out of his bed and going to his chair, he felt an excruciating pain in his right foot. Grades his pain as 7/10 at present. Denies any known trauma to that area.   Review of Systems:  Review of Systems  Constitutional: Negative for fever, chills and diaphoresis.  HENT: Negative for congestion.   Eyes: Negative for blurred vision and double vision. See hpi Respiratory: Negative for cough and shortness of breath.   Cardiovascular: Negative for chest pain and palpitations.  Gastrointestinal: Negative for heartburn, vomiting and abdominal pain.  Genitourinary: Negative for dysuria.  Musculoskeletal: Positive for back pain.  Skin: Negative for rash.  Neurological: Positive for weakness. Negative for dizziness, tremors and headaches.  Psychiatric/Behavioral: Negative for depression. The patient does not have insomnia.     Vitals- 99.8, 101/min, 129/89, 945 on room air  Physical Exam  Constitutional: He is oriented to person, place, and time and well-developed, well-nourished, and in no distress. No distress.  HENT:   Head: Normocephalic and atraumatic.   Mouth/Throat: Oropharynx is clear and moist.  Eyes: Pupils are equal, round, and reactive to light. Right eye is watery and has mild erythema. No conjunctival injection. No discharge.  Neck: Normal range of motion. Neck supple.  Cardiovascular: Normal rate and regular rhythm.    Pulmonary/Chest: Effort normal and breath sounds normal.  Abdominal: Soft. Bowel sounds are normal.  Musculoskeletal: Normal range of motion. He exhibits no edema and no tenderness.  Weakness present, using wheelchair and walker  Neurological: He is alert and oriented to person, place, and time.  Skin: Skin is warm and dry. He is not diaphoretic.  Stage 2 ulcer in posterior helix of left ear, dry skin on legs . Has redness with mild swelling of right feet and ankle. Tenderness above medial malleolus, no effusion noted on exam. No other joints involved Psychiatric: Affect and judgment normal.    Assessment/Plan  Right eye pain- no signs of infection at present. Allergic reactions vs dry eyes could be causing this. Will try artifical tears 3 times a day with hot compresses for now and reassess.   Right foot pain- new onset right foot and ankle pain with swelling and redness. Sugar under control. Off decadron. Concerns for gout attack. Will get right foot and ankle xray to rule out stress fracture. Avoid weight bearing in right foot until fracture is ruled out. Will treat with colchicine right now and see the response. If no improvement, can consider a course of prednisone   chronic pain- continue current pain regimen , has pain contract, will not change pain meds, has appointment with pain clinic this week  Labs- xray right foot and ankle, uric acid level

## 2012-10-01 ENCOUNTER — Encounter: Payer: Self-pay | Admitting: Nurse Practitioner

## 2012-10-05 ENCOUNTER — Other Ambulatory Visit: Payer: Self-pay | Admitting: *Deleted

## 2012-10-05 MED ORDER — OXYCODONE HCL 30 MG PO TABS: 30.0000 mg | ORAL_TABLET | ORAL | Status: DC | PRN

## 2012-10-16 ENCOUNTER — Encounter: Payer: Self-pay | Admitting: Vascular Surgery

## 2012-10-16 ENCOUNTER — Other Ambulatory Visit: Payer: Self-pay | Admitting: *Deleted

## 2012-10-16 DIAGNOSIS — R609 Edema, unspecified: Secondary | ICD-10-CM

## 2012-10-16 DIAGNOSIS — M25569 Pain in unspecified knee: Secondary | ICD-10-CM

## 2012-10-21 ENCOUNTER — Encounter: Payer: Self-pay | Admitting: Vascular Surgery

## 2012-10-22 ENCOUNTER — Encounter (INDEPENDENT_AMBULATORY_CARE_PROVIDER_SITE_OTHER): Payer: Medicare Other | Admitting: *Deleted

## 2012-10-22 ENCOUNTER — Other Ambulatory Visit: Payer: Self-pay | Admitting: *Deleted

## 2012-10-22 ENCOUNTER — Encounter: Payer: Self-pay | Admitting: Vascular Surgery

## 2012-10-22 ENCOUNTER — Ambulatory Visit (INDEPENDENT_AMBULATORY_CARE_PROVIDER_SITE_OTHER): Payer: Medicare Other | Admitting: Vascular Surgery

## 2012-10-22 VITALS — BP 129/67 | HR 99 | Ht 77.0 in | Wt 333.0 lb

## 2012-10-22 DIAGNOSIS — R609 Edema, unspecified: Secondary | ICD-10-CM

## 2012-10-22 DIAGNOSIS — I739 Peripheral vascular disease, unspecified: Secondary | ICD-10-CM

## 2012-10-22 DIAGNOSIS — I82403 Acute embolism and thrombosis of unspecified deep veins of lower extremity, bilateral: Secondary | ICD-10-CM

## 2012-10-22 DIAGNOSIS — I7025 Atherosclerosis of native arteries of other extremities with ulceration: Secondary | ICD-10-CM | POA: Insufficient documentation

## 2012-10-22 NOTE — Progress Notes (Signed)
[facility]  Patient name: Ryan [name] MRN: [id number] DOB: 1950/07/18 Sex: male  REASON FOR CONSULT: swollen legs. Referred by Dr. Glade Lloyd  HPI: Ryan [name] is a 62 y.o. male with a complicated medical history. He was hospitalized in April of this year with a decreased mental status and required intubation. He had acute encephalopathy and sepsis secondary to pneumonia and UTI. Also has a history of spinal stenosis and is followed by the neurosurgeons. He has swelling and redness in both lower extremities and was sent for vascular consultation. He is wheelchair bound. I do not get any history of significant rest pain or nonhealing ulcers. He has had leg swelling for many months and erythema of both lower extremities.  Past Medical History  Diagnosis Date  . Hypertension   . DVT (deep venous thrombosis) ~ 2011    BLE  . 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 Hyperlipidemia   . CAD (coronary atherosclerotic disease)     s/p stents x2  . Small bowel obstruction   . Pancreatitis   . Anxiety   . GERD (gastroesophageal reflux disease)   . CHF (congestive heart failure)   . Myocardial infarction ~ 2009  . Anginal pain   . Pneumonia ~ 2012  . Shortness of breath     "lying down & w/exertion" (03/04/2012)  . Type II diabetes mellitus   . Hep B w/o coma 1970's    "I was in [place]" (03/04/2012)  . Chronic lower back pain   . PTSD (post-traumatic stress disorder)    Family History  Problem Relation Age of Onset  . Diabetes Father   . Colon cancer Neg Hx    SOCIAL HISTORY: History  Substance Use Topics  . Smoking status: Former Smoker -- 0.50 packs/day for 38 years    Types: Cigarettes    Quit date: 06/24/2012  . Smokeless tobacco: Never Used     Comment: just recently quit 3-4 months ago  . 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 Lisinopril Swelling and Rash  . Morphine And Related Swelling and Rash   Current Outpatient Prescriptions  Medication Sig Dispense Refill  . ALPRAZolam (XANAX) 1 MG tablet Take 1 tablet (1 mg total) by mouth 2 (two) times daily.  60 tablet  5  . aspirin EC 81 MG tablet Take 81 mg by mouth daily.      . DULoxetine (CYMBALTA) 60 MG capsule Take 1 capsule (60 mg total) by mouth daily.  30 capsule  0  . Emollient (HYDROPHOR) OINT Apply 1 application topically 2 (two) times daily.      . furosemide (LASIX) 20 MG tablet Take 20 mg by mouth.      . gabapentin (NEURONTIN) 400 MG capsule Take 1 capsule (400 mg total) by mouth 3 (three) times daily.  30 capsule  0  . glipiZIDE (GLUCOTROL XL) 10 MG 24 hr tablet Take 10 mg by mouth 2 (two) times daily.      . hyoscyamine (LEVSIN SL) 0.125 MG SL tablet Place 1 tablet (0.125 mg total) under the tongue every 6 (six) hours as needed for cramping.  30 tablet    . Hypromellose (ARTIFICIAL TEARS OP) Apply 1 drop to eye 3 (three) times daily.      Marland Kitchen  insulin glargine (LANTUS) 100 UNIT/ML injection Inject 20 Units into the skin at bedtime.       . insulin glulisine (APIDRA) 100 UNIT/ML injection Inject 5 Units into the skin 2 (two) times daily. Patient uses twice daily during lunch and dinner .      Marland Kitchen Lactulose Encephalopathy (GENERLAC PO) Take 30 mLs by mouth daily.      Marland Kitchen losartan (COZAAR) 100 MG tablet Take 100 mg by mouth daily.      . Melatonin 3 MG TABS Take 1 tablet by mouth at bedtime as needed.      . metFORMIN (GLUCOPHAGE) 850 MG tablet Take 850 mg by mouth 3 (three) times daily.      . ondansetron (ZOFRAN) 8 MG tablet Take 8 mg by mouth every 8 (eight) hours as needed for nausea.      Marland Kitchen oxycodone (ROXICODONE) 30 MG immediate release tablet Take 1 tablet (30 mg total) by mouth every 4 (four) hours as needed for pain. For breakthrough pain  180 tablet  0  . OxyCODONE HCl ER (OXYCONTIN) 60 MG T12A Take 60 mg by mouth  every 8 (eight) hours. Take one tablet by mouth every eight hours for chronic back pain.  90 each  0  . promethazine (PHENERGAN) 25 MG suppository Place 25 mg rectally every 6 (six) hours as needed for nausea.      Marland Kitchen saccharomyces boulardii (FLORASTOR) 250 MG capsule Take 250 mg by mouth 2 (two) times daily.      . sennosides-docusate sodium (SENOKOT-S) 8.6-50 MG tablet Take 2 tablets by mouth daily. For constipation  60 tablet  0  . traZODone (DESYREL) 100 MG tablet Take 100 mg by mouth at bedtime.      Marland Kitchen zolpidem (AMBIEN) 5 MG tablet Take 1 tablet every night at bedtime as needed for sleep  30 tablet  5  . dexamethasone (DECADRON) 4 MG tablet Take 4 mg by mouth every 12 (twelve) hours.      Marland Kitchen lactulose (CHRONULAC) 10 GM/15ML solution Take 30 mLs (20 g total) by mouth 2 (two) times daily.  960 mL  6  . pantoprazole (PROTONIX) 40 MG tablet Take 1 tablet (40 mg total) by mouth daily.  30 tablet  6   No current facility-administered medications for this visit.   REVIEW OF SYSTEMS: Arly.Keller ] denotes positive finding; [  ] denotes negative finding  CARDIOVASCULAR:  [ ]  chest pain   [ ]  chest pressure   [ ]  palpitations   [ ]  orthopnea   Arly.Keller ] dyspnea on exertion   [ ]  claudication   [ ]  rest pain   [ ]  DVT   [ ]  phlebitis PULMONARY:   [ ]  productive cough   [ ]  asthma   [ ]  wheezing NEUROLOGIC:   Arly.Keller ] weakness  Arly.Keller ] paresthesias  [ ]  aphasia  [ ]  amaurosis  [ ]  dizziness HEMATOLOGIC:   [ ]  bleeding problems   [ ]  clotting disorders MUSCULOSKELETAL:  [ ]  joint pain   [ ]  joint swelling [ ]  leg swelling GASTROINTESTINAL: [ ]   blood in stool  [ ]   hematemesis GENITOURINARY:  [ ]   dysuria  [ ]   hematuria PSYCHIATRIC:  [ ]  history of major depression INTEGUMENTARY:  Arly.Keller ] rashes  [ ]  ulcers CONSTITUTIONAL:  [ ]  fever   [ ]  chills  PHYSICAL EXAM: Filed Vitals:   10/22/12 1328  BP: 129/67  Pulse: 99  Height: 6\' 5"  (1.956  m)  Weight: 333 lb (151.048 kg)  SpO2: 85%   Body mass index is 39.48  kg/(m^2). GENERAL: The patient is a well-nourished male, in no acute distress. The vital signs are documented above. CARDIOVASCULAR: There is a regular rate and rhythm. I do not detect carotid bruits. He is unable to lie flat and this combined with his obesity makes it difficult to palpate femoral pulses. I did interrogate with the Doppler he does have recently brisk Doppler signals in both feet in the dorsalis pedis and posterior tibial positions. He has significant bilateral lower extremity swelling. PULMONARY: There is good air exchange bilaterally without wheezing or rales. ABDOMEN: Soft and non-tender with normal pitched bowel sounds.  MUSCULOSKELETAL: There are no major deformities or cyanosis. NEUROLOGIC: No focal weakness or paresthesias are detected. SKIN: he has cellulitis of both lower extremities. PSYCHIATRIC: The patient has a normal affect.  DATA:  I reviewed his arterial Doppler study which was done on 10/08/2012 and showed an ABI of 60% on the left and 70% on the right.  I independently interpreted the venous duplex scan in our office which shows no evidence of DVT on either lower extremity.  MEDICAL ISSUES: This patient has bilateral lower extremity swelling and cellulitis. There is no evidence of DVT. He does have evidence of peripheral vascular disease however this did not appear to be a significant issues are currently no nonhealing ulcers. I have written him a prescription for Keflex for his cellulitis and also instructed him on the importance of intermittent leg elevation and the proper positioning for this. I'll be happy to see him back at any time if any new vascular issues arise.   DICKSON,CHRISTOPHER S [facility]: [phone]

## 2012-10-30 ENCOUNTER — Ambulatory Visit: Payer: Self-pay

## 2012-10-30 LAB — CBC WITH DIFFERENTIAL/PLATELET
Basophil #: 0 10*3/uL (ref 0.0–0.1)
Eosinophil #: 0.5 10*3/uL (ref 0.0–0.7)
Eosinophil %: 8.3 %
HCT: 37.2 % — ABNORMAL LOW (ref 40.0–52.0)
HGB: 11.5 g/dL — ABNORMAL LOW (ref 13.0–18.0)
MCH: 30.2 pg (ref 26.0–34.0)
MCV: 98 fL (ref 80–100)
Monocyte #: 0.8 x10 3/mm (ref 0.2–1.0)
Neutrophil #: 2.7 10*3/uL (ref 1.4–6.5)
Platelet: 191 10*3/uL (ref 150–440)
RBC: 3.82 10*6/uL — ABNORMAL LOW (ref 4.40–5.90)
RDW: 15.1 % — ABNORMAL HIGH (ref 11.5–14.5)
WBC: 5.7 10*3/uL (ref 3.8–10.6)

## 2012-10-30 LAB — BASIC METABOLIC PANEL
BUN: 18 mg/dL (ref 7–18)
Calcium, Total: 9.1 mg/dL (ref 8.5–10.1)
Chloride: 100 mmol/L (ref 98–107)
Co2: 34 mmol/L — ABNORMAL HIGH (ref 21–32)
Creatinine: 1.3 mg/dL (ref 0.60–1.30)
EGFR (Non-African Amer.): 59 — ABNORMAL LOW
Glucose: 102 mg/dL — ABNORMAL HIGH (ref 65–99)
Osmolality: 281 (ref 275–301)
Potassium: 4.6 mmol/L (ref 3.5–5.1)
Sodium: 140 mmol/L (ref 136–145)

## 2012-11-02 ENCOUNTER — Other Ambulatory Visit: Payer: Self-pay | Admitting: Geriatric Medicine

## 2012-11-02 MED ORDER — OXYCODONE HCL ER 60 MG PO T12A: 60.0000 mg | EXTENDED_RELEASE_TABLET | Freq: Three times a day (TID) | ORAL | Status: DC

## 2012-11-09 ENCOUNTER — Other Ambulatory Visit: Payer: Self-pay | Admitting: Geriatric Medicine

## 2012-11-09 MED ORDER — OXYCODONE HCL 30 MG PO TABS: 30.0000 mg | ORAL_TABLET | ORAL | Status: DC | PRN

## 2012-11-10 ENCOUNTER — Telehealth: Payer: Self-pay | Admitting: Cardiovascular Disease

## 2012-11-10 ENCOUNTER — Ambulatory Visit (INDEPENDENT_AMBULATORY_CARE_PROVIDER_SITE_OTHER): Payer: Medicare Other | Admitting: Cardiovascular Disease

## 2012-11-10 ENCOUNTER — Encounter: Payer: Self-pay | Admitting: Cardiovascular Disease

## 2012-11-10 VITALS — BP 87/57 | HR 135

## 2012-11-10 DIAGNOSIS — I4892 Unspecified atrial flutter: Secondary | ICD-10-CM | POA: Insufficient documentation

## 2012-11-10 DIAGNOSIS — I10 Essential (primary) hypertension: Secondary | ICD-10-CM

## 2012-11-10 DIAGNOSIS — I2510 Atherosclerotic heart disease of native coronary artery without angina pectoris: Secondary | ICD-10-CM

## 2012-11-10 DIAGNOSIS — R609 Edema, unspecified: Secondary | ICD-10-CM

## 2012-11-10 DIAGNOSIS — E119 Type 2 diabetes mellitus without complications: Secondary | ICD-10-CM

## 2012-11-10 LAB — CBC WITH DIFFERENTIAL/PLATELET
Basophils Absolute: 0 10*3/uL (ref 0.0–0.1)
HCT: 36.9 % — ABNORMAL LOW (ref 39.0–52.0)
Lymphs Abs: 1.7 10*3/uL (ref 0.7–4.0)
MCV: 98.2 fl (ref 78.0–100.0)
Monocytes Absolute: 0.9 10*3/uL (ref 0.1–1.0)
Monocytes Relative: 14 % — ABNORMAL HIGH (ref 3.0–12.0)
Platelets: 224 10*3/uL (ref 150.0–400.0)
RDW: 15.4 % — ABNORMAL HIGH (ref 11.5–14.6)

## 2012-11-10 LAB — BASIC METABOLIC PANEL
Calcium: 9.1 mg/dL (ref 8.4–10.5)
GFR: 40.1 mL/min — ABNORMAL LOW (ref >60.00)
Sodium: 136 mEq/L (ref 135–145)

## 2012-11-10 MED ORDER — RIVAROXABAN 20 MG PO TABS: 20.0000 mg | ORAL_TABLET | Freq: Every day | ORAL | Status: DC

## 2012-11-10 MED ORDER — DILTIAZEM HCL ER COATED BEADS 240 MG PO CP24: 240.0000 mg | ORAL_CAPSULE | Freq: Every day | ORAL | Status: DC

## 2012-11-10 NOTE — Patient Instructions (Signed)
Your physician recommends that you schedule a follow-up appointment in: NEXT AVAILABLE WITH  DR [facility] Your physician has recommended you make the following change in your medication:  ADD  DILTIAZEM  240 MG  1 EVERY DAY XARELTO  20 MG  1 TAB WITH EVENING MEAL Your physician recommends that you return for lab work in: TODAY  CBC WITH DIFF AND BMET  Your physician has requested that you have an echocardiogram. Echocardiography is a painless test that uses sound waves to create images of your heart. It provides your doctor with information about the size and shape of your heart and how well your heart's chambers and valves are working. This procedure takes approximately one hour. There are no restrictions for this procedure.

## 2012-11-10 NOTE — Telephone Encounter (Signed)
ROI faxed to [facility] at [phone] 11/10/12./KM

## 2012-11-10 NOTE — Assessment & Plan Note (Signed)
F/U Dr Creta Levin poor insight A1c over 9 in hospital

## 2012-11-10 NOTE — Progress Notes (Signed)
Patient ID: Ryan [name], male   DOB: 1951-03-10, 62 y.o.   MRN: [id number] 62 yo referred by [facility] for edema and ? CHF.  Patient has some memory issues. Mother is health care power of attorney in room with him Had been cared for in [place] area until recently. Inidcates CAD with "stents" done at [facility] 3-4 years ago.  Also indicates being on coumadin in past.  He was hospitalized 4/14 after a fall and intubated and cared for by our CCM team.  In NSR at that time has had chronic LE edema with recent LE duplex showing no DVT but poor quality study.   Has had more dyspnea lately.  ROS: Denies fever, malais, weight loss, blurry vision, decreased visual acuity, cough, sputum, SOB, hemoptysis, pleuritic pain, palpitaitons, heartburn, abdominal pain, melena, lower extremity edema, claudication, or rash.  All other systems reviewed and negative No current bleeding issues and Cr norma in April.  He denies SSCP, chronic dyspnea and edema  He does not walk due to leg weakness.    General: Affect slow  Obese black male  HEENT: normal Neck supple with no adenopathy JVP normal no bruits no thyromegaly Lungs clear with no wheezing and good diaphragmatic motion Heart:  S1/S2 no murmur,rub, gallop or click PMI normal Abdomen: benighn, BS positve, no tenderness, no AAA no bruit.  No HSM or HJR Distal pulses intact with no bruits Plus 2 bilateral edema with chronic stasis  Neuro non-focal Skin warm and dry LE weakness   Medications Current Outpatient Prescriptions  Medication Sig Dispense Refill  . ALPRAZolam (XANAX) 1 MG tablet Take 1 tablet (1 mg total) by mouth 2 (two) times daily.  60 tablet  5  . AMOXICILLIN-POT CLAVULANATE ER PO Take 1 tablet by mouth 2 (two) times daily.      . Emollient (HYDROPHOR) OINT Apply 1 application topically 2 (two) times daily.      Marland Kitchen gabapentin (NEURONTIN) 400 MG capsule Take 1 capsule (400 mg total) by mouth 3 (three) times daily.  30  capsule  0  . Hypromellose (ARTIFICIAL TEARS OP) Apply 1 drop to eye 3 (three) times daily.      . insulin glargine (LANTUS) 100 UNIT/ML injection Inject 20 Units into the skin at bedtime.       . Melatonin 3 MG TABS Take 1 tablet by mouth at bedtime as needed.      . metolazone (ZAROXOLYN) 2.5 MG tablet Take 2.5 mg by mouth daily. For 4 days      . oxycodone (ROXICODONE) 30 MG immediate release tablet Take 1 tablet (30 mg total) by mouth every 4 (four) hours as needed for pain. For breakthrough pain  180 tablet  0  . OxyCODONE HCl ER (OXYCONTIN) 60 MG T12A Take 60 mg by mouth every 8 (eight) hours. Take one tablet by mouth every eight hours for chronic back pain.  90 each  0  . sennosides-docusate sodium (SENOKOT-S) 8.6-50 MG tablet Take 2 tablets by mouth daily. For constipation  60 tablet  0  . torsemide (DEMADEX) 20 MG tablet 3 tabs po qd      . traZODone (DESYREL) 100 MG tablet Take 100 mg by mouth at bedtime.      Marland Kitchen zolpidem (AMBIEN) 5 MG tablet Take 1 tablet every night at bedtime as needed for sleep  30 tablet  5   No current facility-administered medications for this visit.    Allergies Lisinopril and Morphine and related  Family History: Family History  Problem Relation Age of Onset  . Diabetes Father   . Colon cancer Neg Hx     Social History: History   Social History  . Marital Status: Legally Separated    Spouse Name: N/A    Number of Children: 1  . Years of Education: N/A   Occupational History  . Disabled    Social History Main Topics  . Smoking status: Former Smoker -- 0.50 packs/day for 38 years    Types: Cigarettes    Quit date: 06/24/2012  . Smokeless tobacco: Never Used     Comment: just recently quit 3-4 months ago  . 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 Drug Use: Yes    Special: Marijuana     Comment: 03/04/2012 "last marijuana ~ 15-20 yr ago"  .  Sexually Active: Not Currently   Other Topics Concern  . Not on file   Social History Narrative  . No narrative on file    Electrocardiogram: atrial flutter rate 135 new since 4/14 when he was in [place]R   Assessment and Plan

## 2012-11-10 NOTE — Assessment & Plan Note (Signed)
Stable with no angina and good activity level.  Continue medical Rx  

## 2012-11-10 NOTE — Assessment & Plan Note (Signed)
Discussed issues with mother who has some understanding Start xarelto and cardizem  F/U echo to assess EF.  Will consider DCC vs ablation after appropriate period of anticogulation Get records from [place] and [place]

## 2012-11-10 NOTE — Assessment & Plan Note (Signed)
Well controlled.  Continue current medications and low sodium Dash type diet.    

## 2012-11-10 NOTE — Assessment & Plan Note (Signed)
Continue current diuretics F/U Dr Creta Levin  Risk of cellulitis Podiatrist should be arranged at [facility].  Recent duplex sup optimal but no evidence of DVT

## 2012-11-11 ENCOUNTER — Ambulatory Visit (HOSPITAL_COMMUNITY): Payer: Medicare Other | Attending: Cardiovascular Disease

## 2012-11-11 DIAGNOSIS — I379 Nonrheumatic pulmonary valve disorder, unspecified: Secondary | ICD-10-CM | POA: Insufficient documentation

## 2012-11-11 DIAGNOSIS — I059 Rheumatic mitral valve disease, unspecified: Secondary | ICD-10-CM | POA: Insufficient documentation

## 2012-11-11 DIAGNOSIS — E119 Type 2 diabetes mellitus without complications: Secondary | ICD-10-CM | POA: Insufficient documentation

## 2012-11-11 DIAGNOSIS — I10 Essential (primary) hypertension: Secondary | ICD-10-CM | POA: Insufficient documentation

## 2012-11-11 DIAGNOSIS — I4892 Unspecified atrial flutter: Secondary | ICD-10-CM | POA: Insufficient documentation

## 2012-11-11 DIAGNOSIS — R609 Edema, unspecified: Secondary | ICD-10-CM

## 2012-11-11 DIAGNOSIS — I079 Rheumatic tricuspid valve disease, unspecified: Secondary | ICD-10-CM | POA: Insufficient documentation

## 2012-11-11 DIAGNOSIS — I359 Nonrheumatic aortic valve disorder, unspecified: Secondary | ICD-10-CM | POA: Insufficient documentation

## 2012-11-11 NOTE — Progress Notes (Signed)
Echocardiogram performed.  

## 2012-11-13 ENCOUNTER — Other Ambulatory Visit: Payer: Self-pay | Admitting: *Deleted

## 2012-11-13 ENCOUNTER — Encounter: Payer: Self-pay | Admitting: Cardiovascular Disease

## 2012-11-13 DIAGNOSIS — R0600 Dyspnea, unspecified: Secondary | ICD-10-CM

## 2012-11-17 ENCOUNTER — Encounter: Payer: Self-pay | Admitting: Adult Health

## 2012-11-17 ENCOUNTER — Other Ambulatory Visit: Payer: Self-pay | Admitting: Geriatric Medicine

## 2012-11-17 ENCOUNTER — Non-Acute Institutional Stay (SKILLED_NURSING_FACILITY): Payer: Medicare Other | Admitting: Adult Health

## 2012-11-17 DIAGNOSIS — F32A Depression, unspecified: Secondary | ICD-10-CM

## 2012-11-17 DIAGNOSIS — F1120 Opioid dependence, uncomplicated: Secondary | ICD-10-CM

## 2012-11-17 DIAGNOSIS — R609 Edema, unspecified: Secondary | ICD-10-CM

## 2012-11-17 DIAGNOSIS — G8929 Other chronic pain: Secondary | ICD-10-CM

## 2012-11-17 DIAGNOSIS — E1165 Type 2 diabetes mellitus with hyperglycemia: Secondary | ICD-10-CM | POA: Insufficient documentation

## 2012-11-17 DIAGNOSIS — G4700 Insomnia, unspecified: Secondary | ICD-10-CM

## 2012-11-17 DIAGNOSIS — F329 Major depressive disorder, single episode, unspecified: Secondary | ICD-10-CM

## 2012-11-17 DIAGNOSIS — G9340 Encephalopathy, unspecified: Secondary | ICD-10-CM

## 2012-11-17 DIAGNOSIS — I4892 Unspecified atrial flutter: Secondary | ICD-10-CM

## 2012-11-17 DIAGNOSIS — F1920 Other psychoactive substance dependence, uncomplicated: Secondary | ICD-10-CM

## 2012-11-17 DIAGNOSIS — K219 Gastro-esophageal reflux disease without esophagitis: Secondary | ICD-10-CM

## 2012-11-17 DIAGNOSIS — F341 Dysthymic disorder: Secondary | ICD-10-CM

## 2012-11-17 MED ORDER — OXYCODONE HCL ER 20 MG PO T12A: 20.0000 mg | EXTENDED_RELEASE_TABLET | Freq: Two times a day (BID) | ORAL | Status: DC

## 2012-11-17 MED ORDER — ALPRAZOLAM 0.5 MG PO TABS: 0.5000 mg | ORAL_TABLET | Freq: Two times a day (BID) | ORAL | Status: DC

## 2012-11-17 MED ORDER — OXYCODONE HCL 5 MG PO TABA: 5.0000 mg | ORAL_TABLET | ORAL | Status: DC

## 2012-11-17 NOTE — Assessment & Plan Note (Signed)
He is stable will continue protonix 40 mg daily will monitor

## 2012-11-17 NOTE — Assessment & Plan Note (Signed)
Will continue his oxycodone 15 mg every 4 hours as needed; will continue his cymbalta 60 mg daily will restart the oxycontin at 20 mg twice daily and will monitor his status

## 2012-11-17 NOTE — Assessment & Plan Note (Signed)
His ammonia yesterday was 91; is presently taking enulose 30 cc twice daily will increase this to three times daily; will stop the senna at this time. Will monitor his status

## 2012-11-17 NOTE — Assessment & Plan Note (Signed)
He states he is having withdraw issues with nervousness and anxiety; will give him xanax 0.5 mg twice daily and will restart the oxycontin at 20 mg twice daily and will continue to monitor his status

## 2012-11-17 NOTE — Assessment & Plan Note (Signed)
Will continue his aprida 5 units prior to lunch and supper; will continue his lantus 20 units nightly will stop his glipizide as more than likely he is not getting any benefit from this medication. Will continue his metformin 1 gm twice daily

## 2012-11-17 NOTE — Progress Notes (Signed)
Patient ID: Ryan [name], male   DOB: [birth year], 62 y.o.   MRN: [id number]  [facility]  Allergies  Allergen Reactions  . Lisinopril Swelling and Rash  . Morphine And Related Swelling and Rash     Chief Complaint  Patient presents with  . Medical Managment of Chronic Issues    HPI: He is being seen for the management of his chronic illnesses. His ammonia level yesterday was elevated at 91; the level was repeated today and the results are pending. He is complaining of pain"everywhere" and is complaining of being nervous and anxious as his medication xanax; oxycontin were stopped without being weaned. He is alert but lethargic today.    Past Medical History  Diagnosis Date  . Hypertension   . DVT (deep venous thrombosis) ~ 2011    BLE  . 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 Hyperlipidemia   . CAD (coronary atherosclerotic disease)     s/p stents x2  . Small bowel obstruction   . Pancreatitis   . Anxiety   . GERD (gastroesophageal reflux disease)   . CHF (congestive heart failure)   . Myocardial infarction ~ 2009  . Anginal pain   . Pneumonia ~ 2012  . Shortness of breath     "lying down & w/exertion" (03/04/2012)  . Type II diabetes mellitus   . Hep B w/o coma 1970's    "I was in [place]" (03/04/2012)  . Chronic lower back pain   . PTSD (post-traumatic stress disorder)     Past Surgical History  Procedure Laterality Date  . Esophagogastroduodenoscopy  09/22/2011    Procedure: ESOPHAGOGASTRODUODENOSCOPY (EGD);  Surgeon: Charna Elizabeth, MD;  Location: [facility] ENDOSCOPY;  Service: Endoscopy;  Laterality: N/A;  . Esophagogastroduodenoscopy  01/05/2012    Procedure: ESOPHAGOGASTRODUODENOSCOPY (EGD);  Surgeon: Rachael Fee, MD;  Location: [facility] ENDOSCOPY;  Service: Endoscopy;  Laterality: N/A;  . Cholecystectomy  ~ 2009  . Spinal growth rods  ~ 2011  . Tonsillectomy and adenoidectomy    . Coronary angioplasty with stent placement  ~ 2009`    "1 + !  (after 1st one failed)" (03/04/2012)    VITAL SIGNS  Filed Vitals:   11/17/12 1150  BP: 118/72  Pulse: 70  Height: 6\' 5"  (1.956 m)  Weight: 364 lb 9.6 oz (165.381 kg)    Patient's Medications  New Prescriptions   No medications on file  Previous Medications   ASPIRIN 81 MG TABLET    Take 81 mg by mouth daily.   DULOXETINE (CYMBALTA) 60 MG CAPSULE    Take 60 mg by mouth daily.   EMOLLIENT (HYDROPHOR) OINT    Apply 1 application topically 2 (two) times daily.   FUROSEMIDE (LASIX) 20 MG TABLET    Take 60 mg by mouth daily.   GLIPIZIDE (GLUCOTROL XL) 10 MG 24 HR TABLET    Take 10 mg by mouth 2 (two) times daily.   HYPROMELLOSE (ARTIFICIAL TEARS OP)    Apply 1 drop to eye 3 (three) times daily.   INSULIN GLARGINE (LANTUS) 100 UNIT/ML INJECTION    Inject 20 Units into the skin at bedtime.    INSULIN GLULISINE (APIDRA) 100 UNIT/ML INJECTION    Inject 5 Units into the skin 2 (two) times daily. Prior to lunch and supper   LACTULOSE (CHRONULAC) 10 GM/15ML SOLUTION    Take 30 mLs by mouth 2 (two) times daily.   LOSARTAN (COZAAR) 50 MG TABLET    Take  50 mg by mouth daily.   MELATONIN 3 MG TABS    Take 1 tablet by mouth at bedtime as needed.   METFORMIN (GLUCOPHAGE) 1000 MG TABLET    Take 1,000 mg by mouth 2 (two) times daily with a meal.   PANTOPRAZOLE (PROTONIX) 40 MG TABLET    Take 40 mg by mouth daily.   RIVAROXABAN (XARELTO) 20 MG TABS    Take 1 tablet (20 mg total) by mouth daily.   SENNOSIDES-DOCUSATE SODIUM (SENOKOT-S) 8.6-50 MG TABLET    Take 2 tablets by mouth daily. For constipation   TRAZODONE (DESYREL) 100 MG TABLET    Take 100 mg by mouth at bedtime.  Modified Medications   Modified Medication Previous Medication   DILTIAZEM (CARDIZEM CD) 240 MG 24 HR CAPSULE diltiazem (CARDIZEM CD) 240 MG 24 hr capsule      Take 180 mg by mouth daily.    Take 1 capsule (240 mg total) by mouth daily.   OXYCODONE (ROXICODONE) 30 MG IMMEDIATE RELEASE TABLET oxycodone (ROXICODONE) 30 MG immediate  release tablet      Take 15 mg by mouth every 4 (four) hours as needed for pain. For breakthrough pain    Take 1 tablet (30 mg total) by mouth every 4 (four) hours as needed for pain. For breakthrough pain  Discontinued Medications   ALPRAZOLAM (XANAX) 1 MG TABLET    Take 1 tablet (1 mg total) by mouth 2 (two) times daily.   AMOXICILLIN-POT CLAVULANATE ER PO    Take 1 tablet by mouth 2 (two) times daily.   GABAPENTIN (NEURONTIN) 400 MG CAPSULE    Take 1 capsule (400 mg total) by mouth 3 (three) times daily.   METOLAZONE (ZAROXOLYN) 2.5 MG TABLET    Take 2.5 mg by mouth daily. For 4 days   OXYCODONE HCL ER (OXYCONTIN) 60 MG T12A    Take 60 mg by mouth every 8 (eight) hours. Take one tablet by mouth every eight hours for chronic back pain.   OXYCODONE HCL, ABUSE DETER, 5 MG TABA    Take 5 mg by mouth every 4 (four) hours. Take 3 tablets by mouth every 4 hours as needed for pain.   TORSEMIDE (DEMADEX) 20 MG TABLET    3 tabs po qd   ZOLPIDEM (AMBIEN) 5 MG TABLET    Take 1 tablet every night at bedtime as needed for sleep    SIGNIFICANT DIAGNOSTIC EXAMS  10-29-12: chest x-ray: poor inspiration secondary to body habitus; no cardiomegaly; mild pulmonary vascular congestion; no pleural effusion. patchy bibasilar atelectasis or interstitial pneumonitis.  11-11-12: 2-d echo: Left ventricle: The cavity size was normal. Wall thickness was increased in a pattern of mild LVH. The estimated ejection fraction was 55%.- Mitral valve: Calcified annulus. Mild regurgitation.- Left atrium: The atrium was mildly dilated.- Right ventricle: The cavity size was severely dilated.- Right atrium: The atrium was moderately dilated.- Atrial septum: No defect or patent foramen ovale was identified.- Impressions: Signs of RV failure and cor pulmonale   LABS REVIEWED:   08-15-12: hgb a1c 9.9 10-28-12: hgb a1c 9.8 10-30-12: wbc 5.7; hgb 11.5; hct 37.2; mcv 98; plt 191 glucose 102; bun 18; creat 1.30; k+ 4.6 Na++ 146 11-02-12 ;glucose  84; bun 23; bun 1.2;k+ 4.2; na++ 141 11-10-12: wbc 6.1; hgb 11.6; hct 36.9; mcv 98.2; plt 224; glucose 102; bun 51; creat 2.2; k+ 3.6 Na++ 136   Review of Systems  Constitutional: Positive for malaise/fatigue.  Respiratory: Negative for cough and shortness of  breath.   Cardiovascular: Negative for chest pain and palpitations.  Gastrointestinal: Positive for diarrhea. Negative for heartburn and abdominal pain.  Musculoskeletal: Positive for myalgias and joint pain.  Skin: Negative.   Neurological: Negative for dizziness and headaches.  Psychiatric/Behavioral: The patient is nervous/anxious. The patient does not have insomnia.     Physical Exam  Constitutional: He appears well-developed and well-nourished.  Morbidly obese  Neck: Neck supple. No JVD present. No thyromegaly present.  Cardiovascular: Normal rate, regular rhythm and intact distal pulses.   Respiratory: Effort normal and breath sounds normal. No respiratory distress.  Breath sounds diminished due to obesity  GI: Soft. Bowel sounds are normal. He exhibits no distension. There is no tenderness.  Musculoskeletal: Normal range of motion.  Trace lower extremity edema  Neurological: He is alert.  Skin: Skin is warm and dry.  Psychiatric:  Is nervous     ASSESSMENT/ PLAN:  GERD He is stable will continue protonix 40 mg daily will monitor  Atrial flutter He is without change in status heart rate is presently regular; will continue his xarelto 20 mg daily is presently also taking asa 81 mg daily. He is followed by cardiology. Will stop asa; he is declining daily weights will weight him 3 times weekly and will call for weight gain of 3 pounds or more or 7 pounds in one week.   Type II or unspecified type diabetes mellitus with unspecified complication, uncontrolled Will continue his aprida 5 units prior to lunch and supper; will continue his lantus 20 units nightly will stop his glipizide as more than likely he is not getting  any benefit from this medication. Will continue his metformin 1 gm twice daily   Chronic pain Will continue his oxycodone 15 mg every 4 hours as needed; will continue his cymbalta 60 mg daily will restart the oxycontin at 20 mg twice daily and will monitor his status   Anxiety and depression Will continue his cymbalta 60 mg daily will restart the xanax at 0.5 mg twice daily and will hold for sedation   Encephalopathy acute His ammonia yesterday was 91; is presently taking enulose 30 cc twice daily will increase this to three times daily; will stop the senna at this time. Will monitor his status  Edema His edema is without change will continue his lasix at 60 mg daily his zaroxyln was stopped. Will continue to monitor his status   Insomnia Will continue melatonin 3 mg nighlty   Narcotic dependence He states he is having withdraw issues with nervousness and anxiety; will give him xanax 0.5 mg twice daily and will restart the oxycontin at 20 mg twice daily and will continue to monitor his status    Time spent with patient 50 minutes

## 2012-11-17 NOTE — Assessment & Plan Note (Signed)
Will continue melatonin 3 mg nighlty

## 2012-11-17 NOTE — Assessment & Plan Note (Signed)
His edema is without change will continue his lasix at 60 mg daily his zaroxyln was stopped. Will continue to monitor his status

## 2012-11-17 NOTE — Assessment & Plan Note (Addendum)
He is without change in status heart rate is presently regular; will continue his xarelto 20 mg daily is presently also taking asa 81 mg daily. He is followed by cardiology. Will stop asa; he is declining daily weights will weight him 3 times weekly and will call for weight gain of 3 pounds or more or 7 pounds in one week.

## 2012-11-17 NOTE — Assessment & Plan Note (Signed)
Will continue his cymbalta 60 mg daily will restart the xanax at 0.5 mg twice daily and will hold for sedation

## 2012-11-23 ENCOUNTER — Encounter: Payer: Self-pay | Admitting: Internal Medicine

## 2012-12-01 ENCOUNTER — Telehealth: Payer: Self-pay | Admitting: Cardiovascular Disease

## 2012-12-01 NOTE — Telephone Encounter (Signed)
Records rec From [facility], Christine/Dr.Nishan off this Week Will  Hold Until Next Week 12/01/12/KM

## 2012-12-07 ENCOUNTER — Non-Acute Institutional Stay (SKILLED_NURSING_FACILITY): Payer: PRIVATE HEALTH INSURANCE | Admitting: Internal Medicine

## 2012-12-07 ENCOUNTER — Other Ambulatory Visit: Payer: Self-pay | Admitting: Geriatric Medicine

## 2012-12-07 DIAGNOSIS — E1143 Type 2 diabetes mellitus with diabetic autonomic (poly)neuropathy: Secondary | ICD-10-CM

## 2012-12-07 DIAGNOSIS — M48062 Spinal stenosis, lumbar region with neurogenic claudication: Secondary | ICD-10-CM

## 2012-12-07 DIAGNOSIS — F32A Depression, unspecified: Secondary | ICD-10-CM

## 2012-12-07 DIAGNOSIS — E119 Type 2 diabetes mellitus without complications: Secondary | ICD-10-CM

## 2012-12-07 DIAGNOSIS — E1149 Type 2 diabetes mellitus with other diabetic neurological complication: Secondary | ICD-10-CM

## 2012-12-07 DIAGNOSIS — I4892 Unspecified atrial flutter: Secondary | ICD-10-CM

## 2012-12-07 DIAGNOSIS — G909 Disorder of the autonomic nervous system, unspecified: Secondary | ICD-10-CM

## 2012-12-07 DIAGNOSIS — F341 Dysthymic disorder: Secondary | ICD-10-CM

## 2012-12-07 DIAGNOSIS — F329 Major depressive disorder, single episode, unspecified: Secondary | ICD-10-CM

## 2012-12-07 MED ORDER — OXYCODONE HCL 30 MG PO TABS: 15.0000 mg | ORAL_TABLET | ORAL | Status: DC | PRN

## 2012-12-07 NOTE — Progress Notes (Signed)
Patient ID: Ryan [name], male   DOB: August 01, 1950, 62 y.o.   MRN: [id number]  [facility]  Code Status: full code  Allergies  Allergen Reactions  . Lisinopril Swelling and Rash  . Morphine And Related Swelling and Rash    Chief Complaint  Patient presents with  . Medical Managment of Chronic Issues    HPI:  62 y/o male patient is a long term care resident in the facility. He was seen in his room today. He complaints of pain in his legs and back. It is chronic in nature and current pain medications have not been helpful.  The swelling in both his legs have subsided. He is followed by wound care team for his venous stasis  Review of Systems  Constitutional: Negative for fever, chills and diaphoresis.  HENT: Negative for congestion.   Eyes: Negative for blurred vision.  Respiratory: Negative for cough and shortness of breath.   Cardiovascular: Positive for leg swelling. Negative for chest pain and claudication.  Gastrointestinal: Negative for heartburn, nausea, vomiting, abdominal pain and constipation.  Genitourinary: Negative for dysuria.  Musculoskeletal: Positive for joint pain.  Skin: Negative for rash.  Neurological: Negative for dizziness, sensory change and headaches.  Psychiatric/Behavioral: Negative for depression.     Past Medical History  Diagnosis Date  . Hypertension   . DVT (deep venous thrombosis) ~ 2011    BLE  . 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 Hyperlipidemia   . CAD (coronary atherosclerotic disease)     s/p stents x2  . Small bowel obstruction   . Pancreatitis   . Anxiety   . GERD (gastroesophageal reflux disease)   . CHF (congestive heart failure)   . Myocardial infarction ~ 2009  . Anginal pain   . Pneumonia ~ 2012  . Shortness of breath     "lying down & w/exertion" (03/04/2012)  . Type II diabetes mellitus   . Hep B w/o coma 1970's    "I was in [place]" (03/04/2012)  . Chronic lower back pain   .  PTSD (post-traumatic stress disorder)    Past Surgical History  Procedure Laterality Date  . Esophagogastroduodenoscopy  09/22/2011    Procedure: ESOPHAGOGASTRODUODENOSCOPY (EGD);  Surgeon: Charna Elizabeth, MD;  Location: [facility] ENDOSCOPY;  Service: Endoscopy;  Laterality: N/A;  . Esophagogastroduodenoscopy  01/05/2012    Procedure: ESOPHAGOGASTRODUODENOSCOPY (EGD);  Surgeon: Rachael Fee, MD;  Location: [facility] ENDOSCOPY;  Service: Endoscopy;  Laterality: N/A;  . Cholecystectomy  ~ 2009  . Spinal growth rods  ~ 2011  . Tonsillectomy and adenoidectomy    . Coronary angioplasty with stent placement  ~ 2009`    "1 + ! (after 1st one failed)" (03/04/2012)   Social History:   reports that he quit smoking about 5 months ago. His smoking use included Cigarettes. He has a 19 pack-year smoking history. He has never used smokeless tobacco. He reports that  drinks alcohol. He reports that he uses illicit drugs (Marijuana).  Family History  Problem Relation Age of Onset  . Diabetes Father   . Colon cancer Neg Hx     Medications: Patient's Medications  New Prescriptions   No medications on file  Previous Medications   ALPRAZOLAM (XANAX) 0.5 MG TABLET    Take 1 tablet (0.5 mg total) by mouth 2 (two) times daily.   ASPIRIN 81 MG TABLET    Take 81 mg by mouth daily.   DILTIAZEM (CARDIZEM CD) 240 MG  24 HR CAPSULE    Take 180 mg by mouth daily.   DULOXETINE (CYMBALTA) 60 MG CAPSULE    Take 60 mg by mouth daily.   EMOLLIENT (HYDROPHOR) OINT    Apply 1 application topically 2 (two) times daily.   FUROSEMIDE (LASIX) 20 MG TABLET    Take 60 mg by mouth daily.   GLIPIZIDE (GLUCOTROL XL) 10 MG 24 HR TABLET    Take 10 mg by mouth 2 (two) times daily.   HYPROMELLOSE (ARTIFICIAL TEARS OP)    Apply 1 drop to eye 3 (three) times daily.   INSULIN GLARGINE (LANTUS) 100 UNIT/ML INJECTION    Inject 20 Units into the skin at bedtime.    INSULIN GLULISINE (APIDRA) 100 UNIT/ML INJECTION    Inject 5 Units into the skin 2 (two)  times daily. Prior to lunch and supper   LACTULOSE (CHRONULAC) 10 GM/15ML SOLUTION    Take 30 mLs by mouth 2 (two) times daily.   LOSARTAN (COZAAR) 50 MG TABLET    Take 50 mg by mouth daily.   MELATONIN 3 MG TABS    Take 1 tablet by mouth at bedtime as needed.   METFORMIN (GLUCOPHAGE) 1000 MG TABLET    Take 1,000 mg by mouth 2 (two) times daily with a meal.   OXYCODONE (OXYCONTIN) 20 MG T12A    Take 1 tablet (20 mg total) by mouth every 12 (twelve) hours.   OXYCODONE (ROXICODONE) 30 MG IMMEDIATE RELEASE TABLET    Take 15 mg by mouth every 4 (four) hours as needed for pain. For breakthrough pain   PANTOPRAZOLE (PROTONIX) 40 MG TABLET    Take 40 mg by mouth daily.   RIVAROXABAN (XARELTO) 20 MG TABS    Take 1 tablet (20 mg total) by mouth daily.   SENNOSIDES-DOCUSATE SODIUM (SENOKOT-S) 8.6-50 MG TABLET    Take 2 tablets by mouth daily. For constipation   TRAZODONE (DESYREL) 100 MG TABLET    Take 100 mg by mouth at bedtime.  Modified Medications   No medications on file  Discontinued Medications   No medications on file     Filed Vitals:   12/07/12 1327  BP: 126/76  Pulse: 80  Temp: 97.2 F (36.2 C)  Resp: 18  SpO2: 96%   Physical Exam  Constitutional: He is oriented to person, place, and time. He appears well-developed and well-nourished. No distress.  HENT:  Head: Normocephalic and atraumatic.  Nose: Nose normal.  Mouth/Throat: Oropharynx is clear and moist.  Eyes: Conjunctivae are normal. Pupils are equal, round, and reactive to light.  Neck: Normal range of motion. Neck supple. No JVD present. No thyromegaly present.  Cardiovascular: Normal rate and regular rhythm.   Pulmonary/Chest: Effort normal and breath sounds normal. No respiratory distress. He has no wheezes. He exhibits no tenderness.  Abdominal: Soft. Bowel sounds are normal. There is no tenderness.  Musculoskeletal: Normal range of motion.  Weakness in lower extremities, propels self in wheelchair,  Trace edema both  legs, chronic vascular changes in both leg  Lymphadenopathy:    He has no cervical adenopathy.  Neurological: He is alert and oriented to person, place, and time. No cranial nerve deficit.  Skin: Skin is warm and dry. He is not diaphoretic.  Psychiatric: He has a normal mood and affect. His behavior is normal.     Labs reviewed: Basic Metabolic Panel:  Recent Labs  09/81/19 0816  05/05/12 0630  06/01/12 0555 06/01/12 0944  08/18/12 0825 08/19/12 0350 11/10/12 1006  NA 137  < > 139  < > 140  --   < > 135 134* 136  K 3.4*  < > 4.0  < > 3.4*  --   < > 4.1 5.0 3.6  CL 99  < > 104  < > 99  --   < > 101 100 88*  CO2 29  < > 22  < > 29  --   < > 30 28 35*  GLUCOSE 137*  < > 239*  < > 316*  --   < > 104* 251* 102*  BUN 10  < > 10  < > 9  --   < > 8 13 51*  CREATININE 0.77  < > 0.74  < > 0.68  0.66  --   < > 0.62 0.61 2.2*  CALCIUM 9.1  < > 9.0  < > 10.3  --   < > 9.1 9.4 9.1  MG 2.0  --  1.9  --   --  1.9  --   --   --   --   PHOS  --   --   --   --  3.1  --   --   --   --   --   < > = values in this interval not displayed. Liver Function Tests:  Recent Labs  06/01/12 0944 08/14/12 1100 08/18/12 0825  AST 13 150* 114*  ALT 6 44 43  ALKPHOS 84 196* 77  BILITOT 0.4 0.2* 0.2*  PROT 8.0 7.4 5.8*  ALBUMIN 4.0 3.1* 2.2*    Recent Labs  12/20/11 1536  05/25/12 0800 06/01/12 0555 07/25/12 1155  LIPASE 30  < > 15 23 15   AMYLASE 50  --   --   --   --   < > = values in this interval not displayed.  Recent Labs  08/14/12 1100  AMMONIA 39   CBC:  Recent Labs  07/25/12 1155 08/14/12 1100  08/16/12 0535 08/19/12 0350 11/10/12 1006  WBC 6.0 11.9*  < > 8.8 4.1 6.1  NEUTROABS 3.1 10.2*  --   --   --  3.2  HGB 15.7 16.7  < > 12.7* 11.5* 11.6*  HCT 46.1 50.4  < > 39.3 37.7* 36.9*  MCV 91.7 95.5  < > 95.6 96.9 98.2  PLT 207 196  < > 189 236 224.0  < > = values in this interval not displayed. Cardiac Enzymes:  Recent Labs  08/14/12 1100 08/14/12 1545  08/14/12 2137 08/15/12 0420  CKTOTAL 09811*  --   --   --   TROPONINI <0.30 <0.30 <0.30 <0.30   BNP: No components found with this basename: POCBNP,  CBG:  Recent Labs  08/18/12 2143 08/19/12 0724 08/19/12 1131  GLUCAP 178* 199* 188*    Labs and medication reviewed  Assessment/Plan  Peripheral neuropathy- recently started on neurontin 300 mg daily, monitor for now  Spinal stenosis- with chronic LE pain, will increase oxycontin to 30 mg bid and continue prn oxycodone  Dm type 2- sugar has been running high recently, will increase lantus to 25 u daily and continue SSI and metformin. Continue asa  Depression- continue xanax and cymbalta  Atrial flutter- rate currently controlled with cardizem, continue this and xarelto  chf- breathing stable, lungs are clear and weight is stable. Continue losartan, lasix   Family/ staff Communication: reviewed care plan with nursing staff and patient

## 2012-12-08 ENCOUNTER — Other Ambulatory Visit: Payer: Self-pay | Admitting: Geriatric Medicine

## 2012-12-08 MED ORDER — OXYCODONE HCL 30 MG PO TABS: ORAL_TABLET | ORAL | Status: DC

## 2012-12-12 IMAGING — CR DG ANKLE COMPLETE 3+V*L*
4 series · 4 of 4 positions shown · non-contrast
Comparison: None.

CLINICAL DATA: Fall.  Pain

LEFT ANKLE COMPLETE - 3+ VIEW

[x ankle ap left]
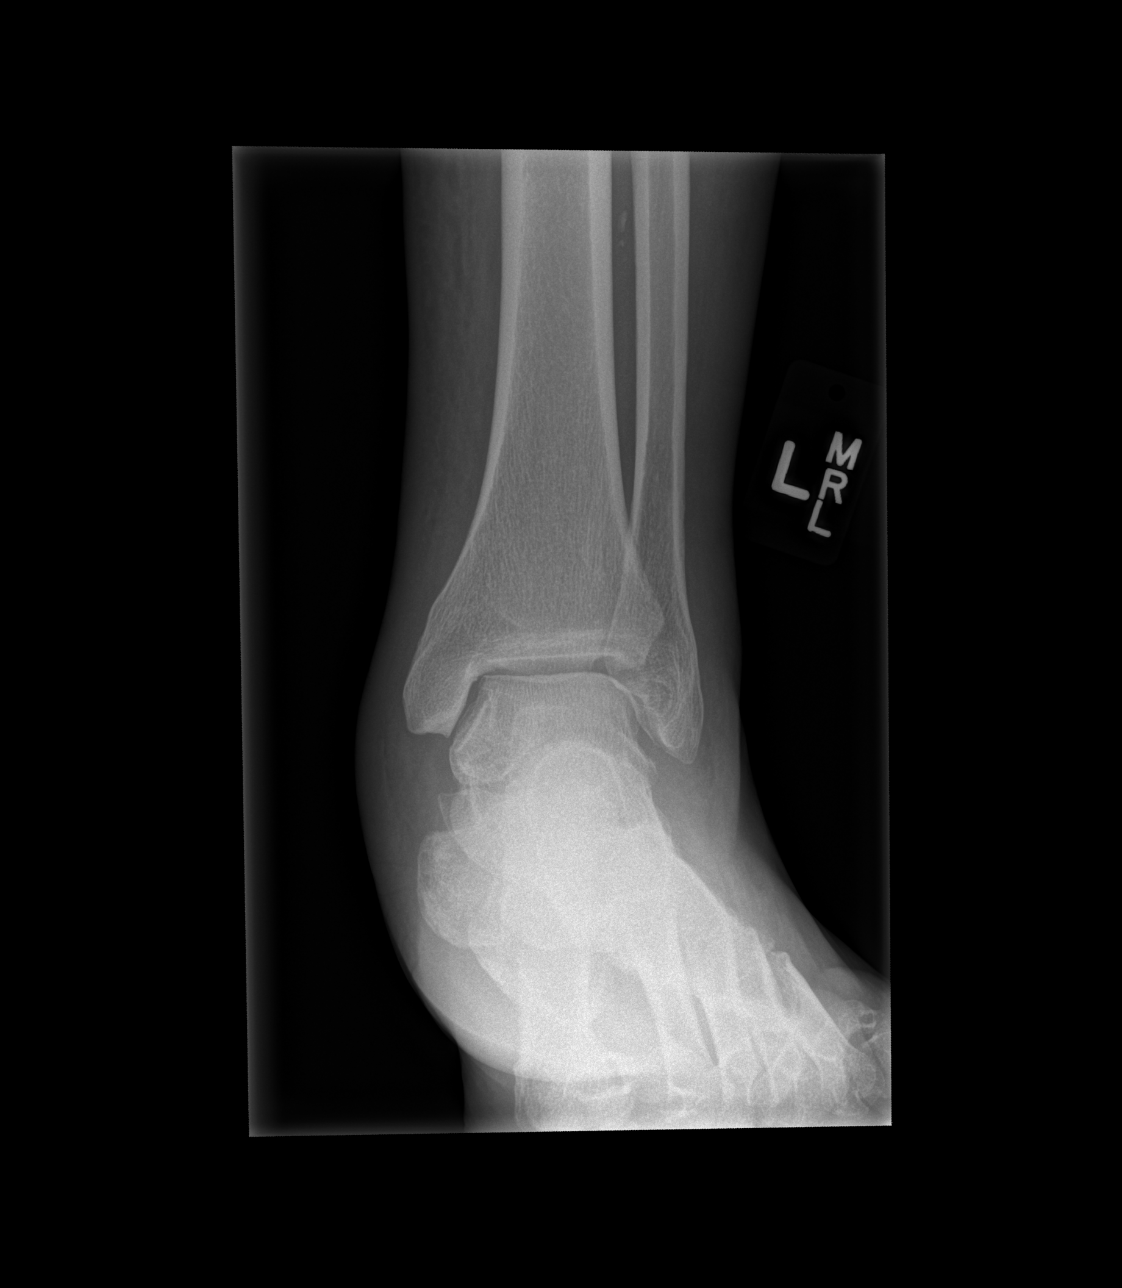

[x ankle obl left]
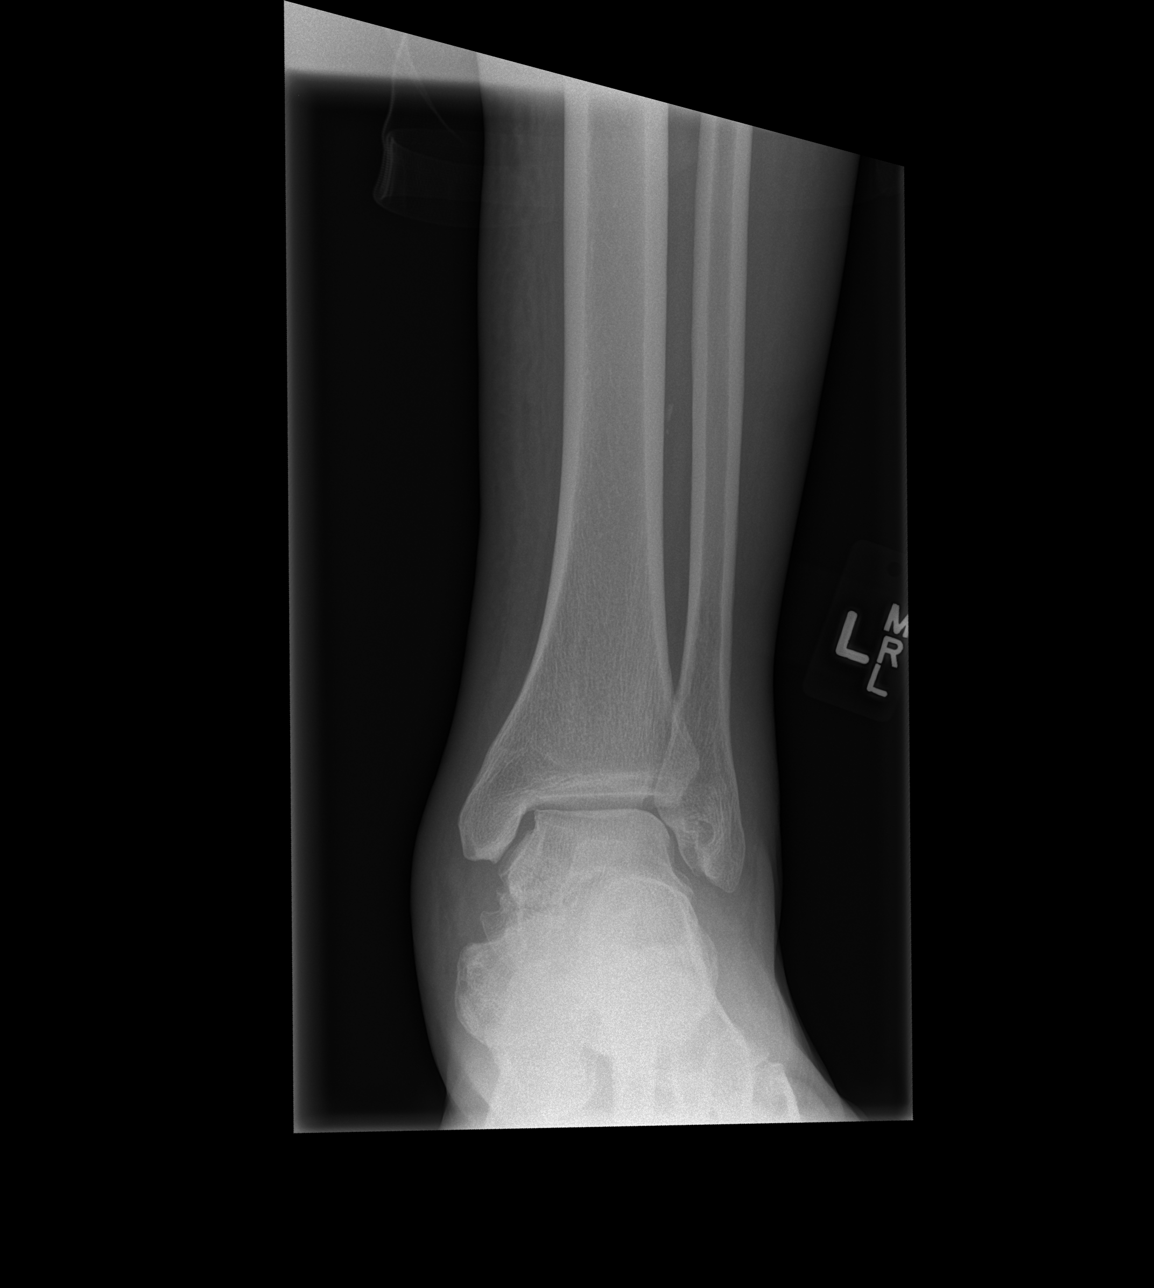

[x ankle lat left (1 of 2)]
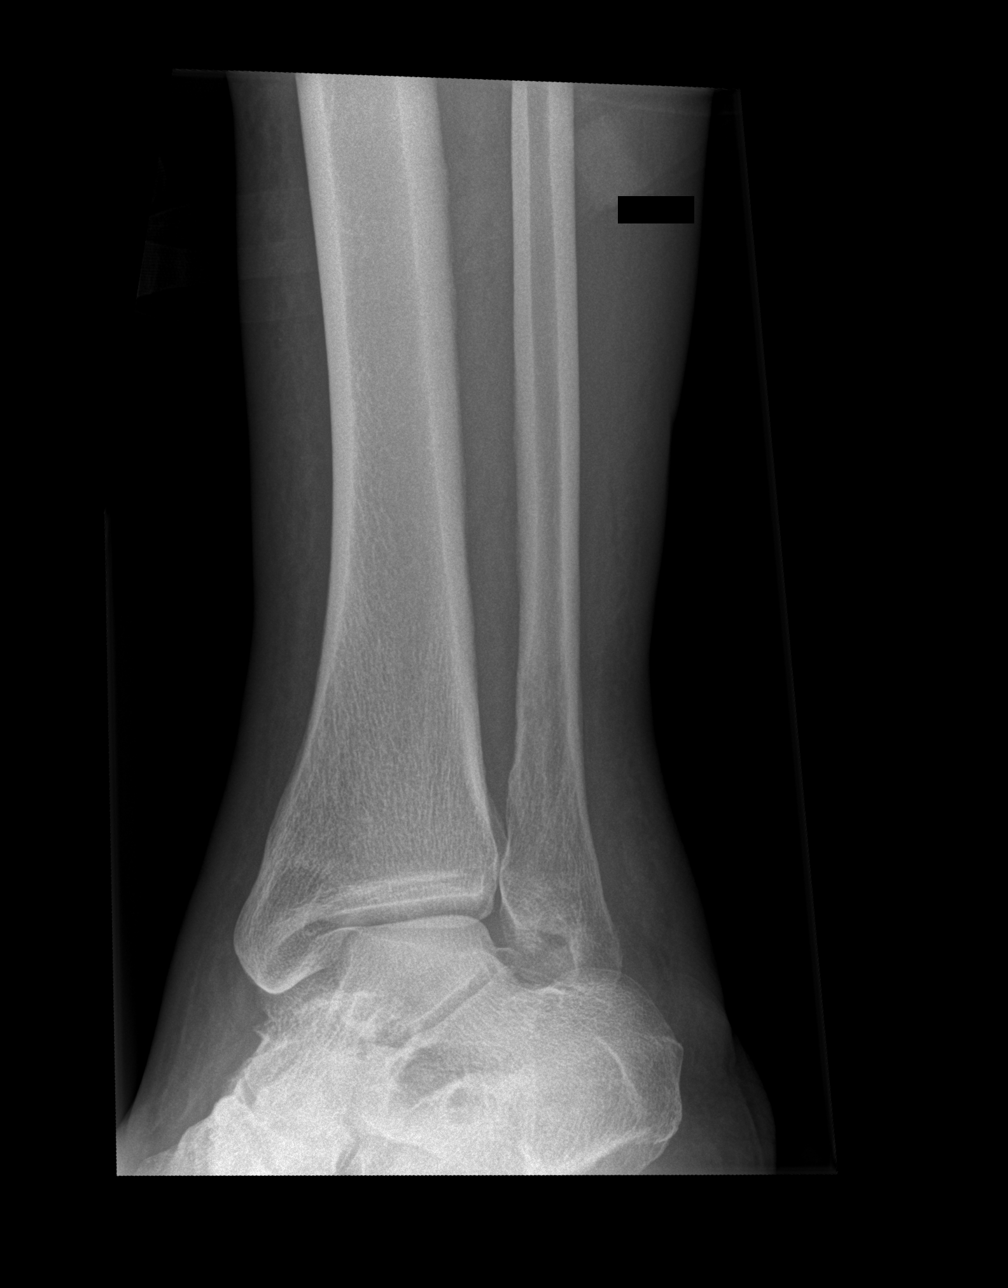

[x ankle lat left (2 of 2)]
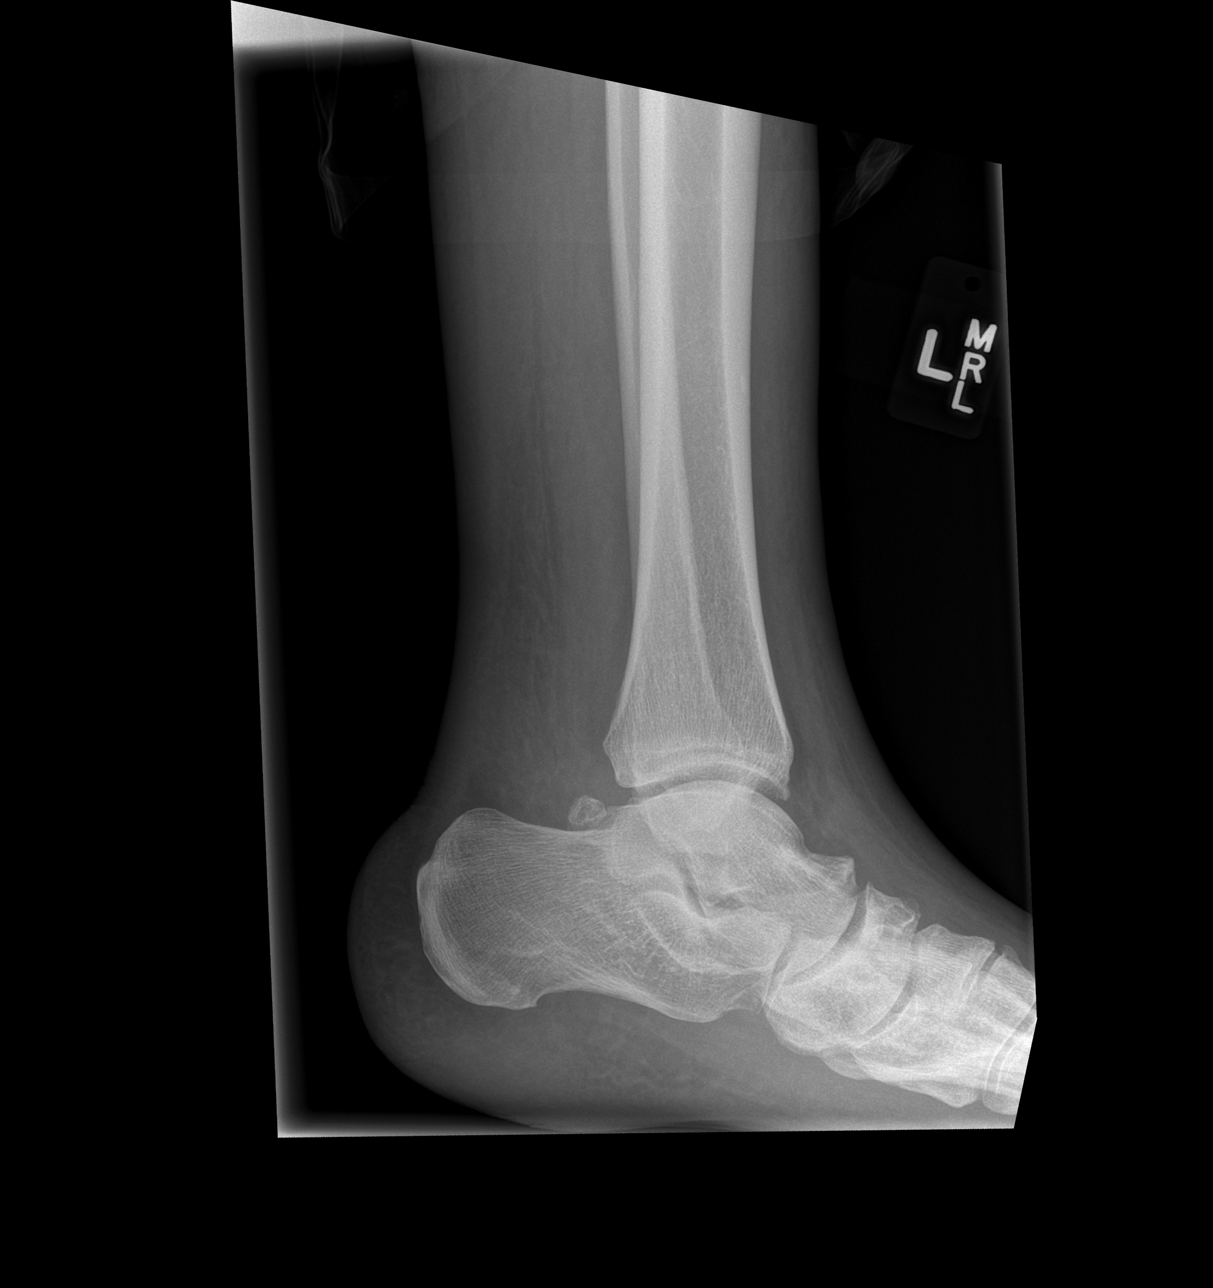

[4 of 4 positions shown; findings below may reference images not displayed]

FINDINGS: Negative for fracture.  Pes planum.  No significant
degenerative change in the ankle joint.
IMPRESSION: No acute abnormality.

## 2012-12-12 IMAGING — CR DG ANKLE COMPLETE 3+V*R*
3 series · 3 of 3 positions shown · non-contrast
Comparison: None.

CLINICAL DATA: Fall

RIGHT ANKLE - COMPLETE 3+ VIEW

[x ankle ap right]
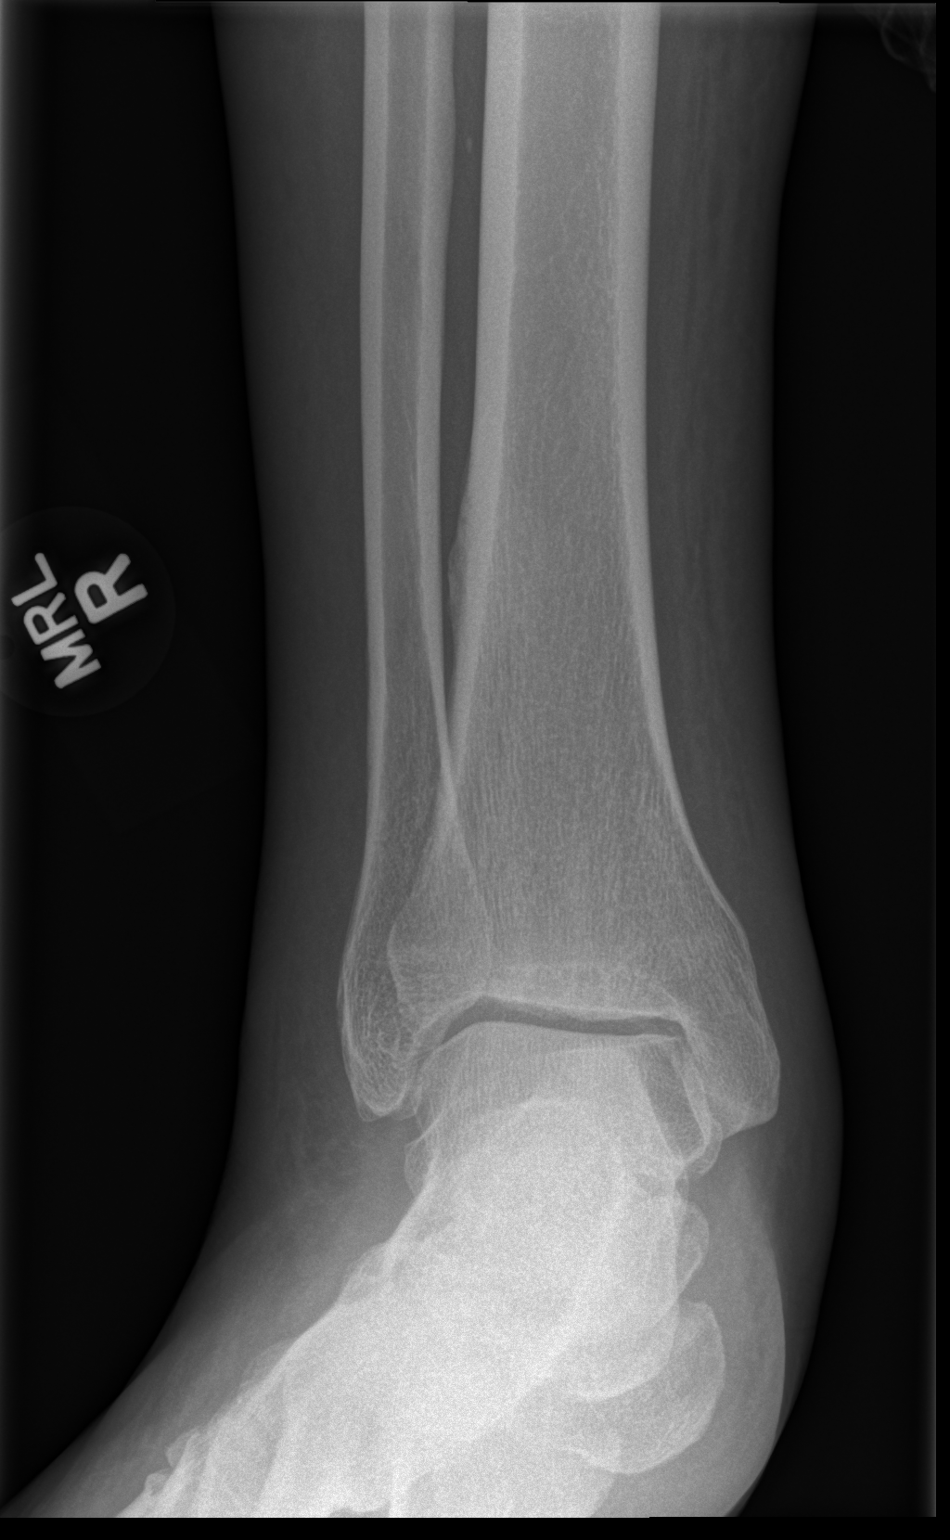

[x ankle obl right]
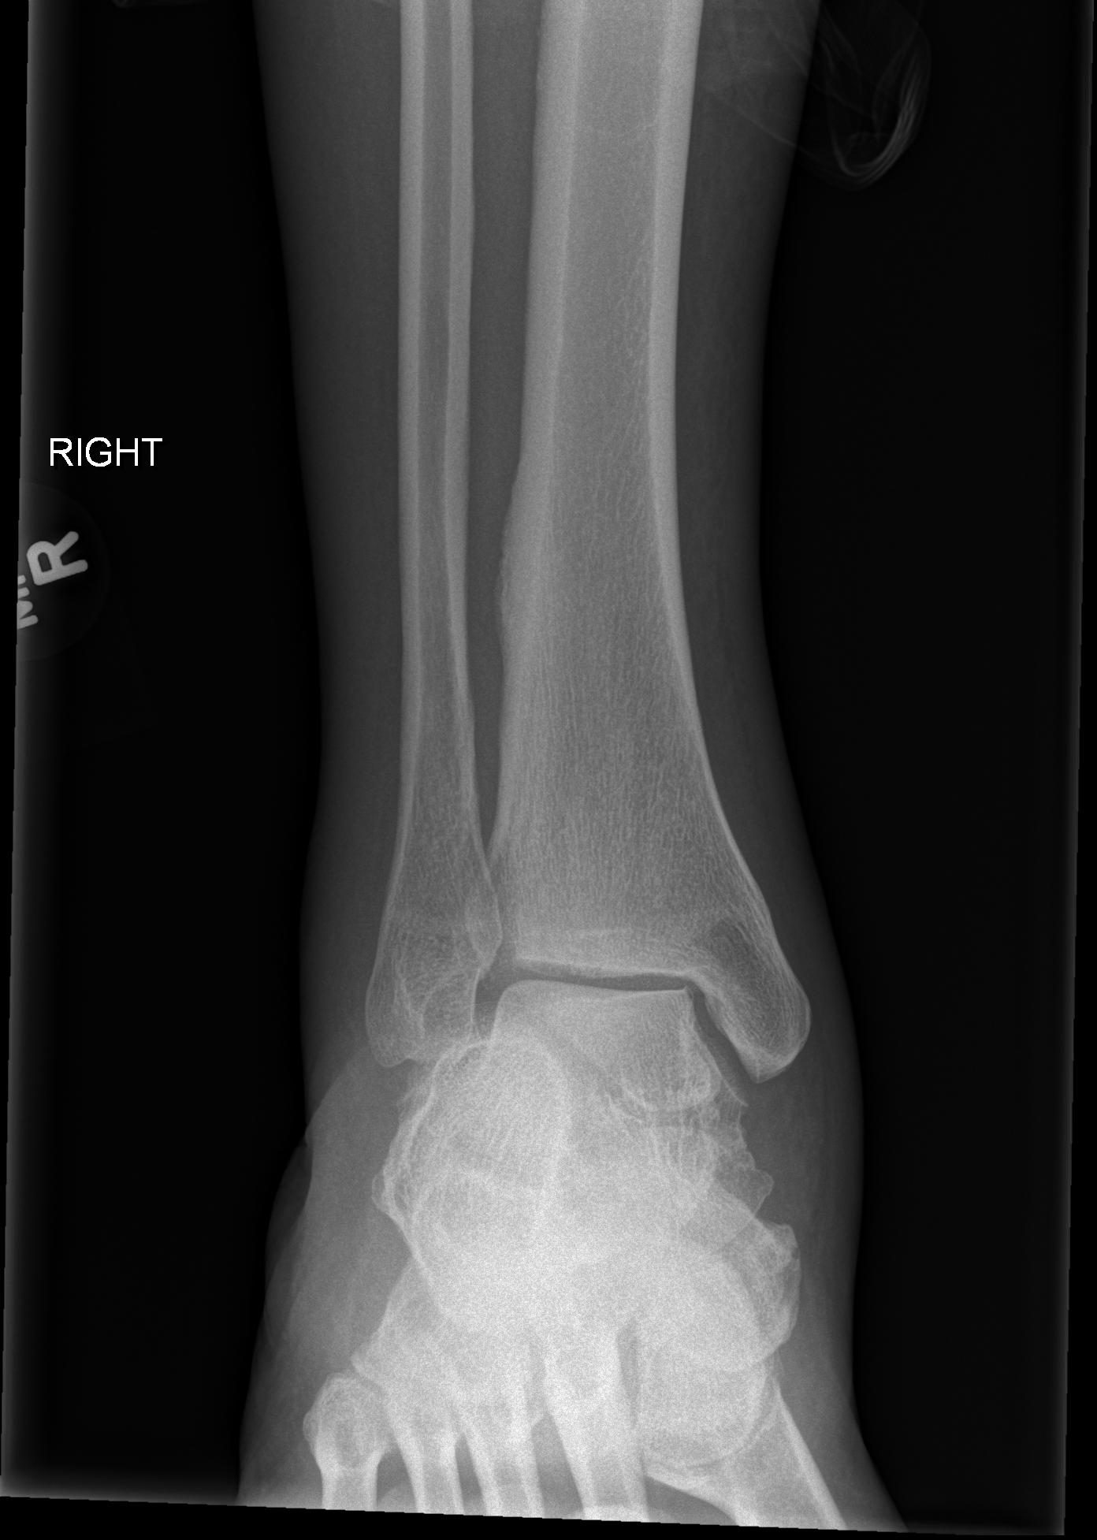

[x ankle lat right]
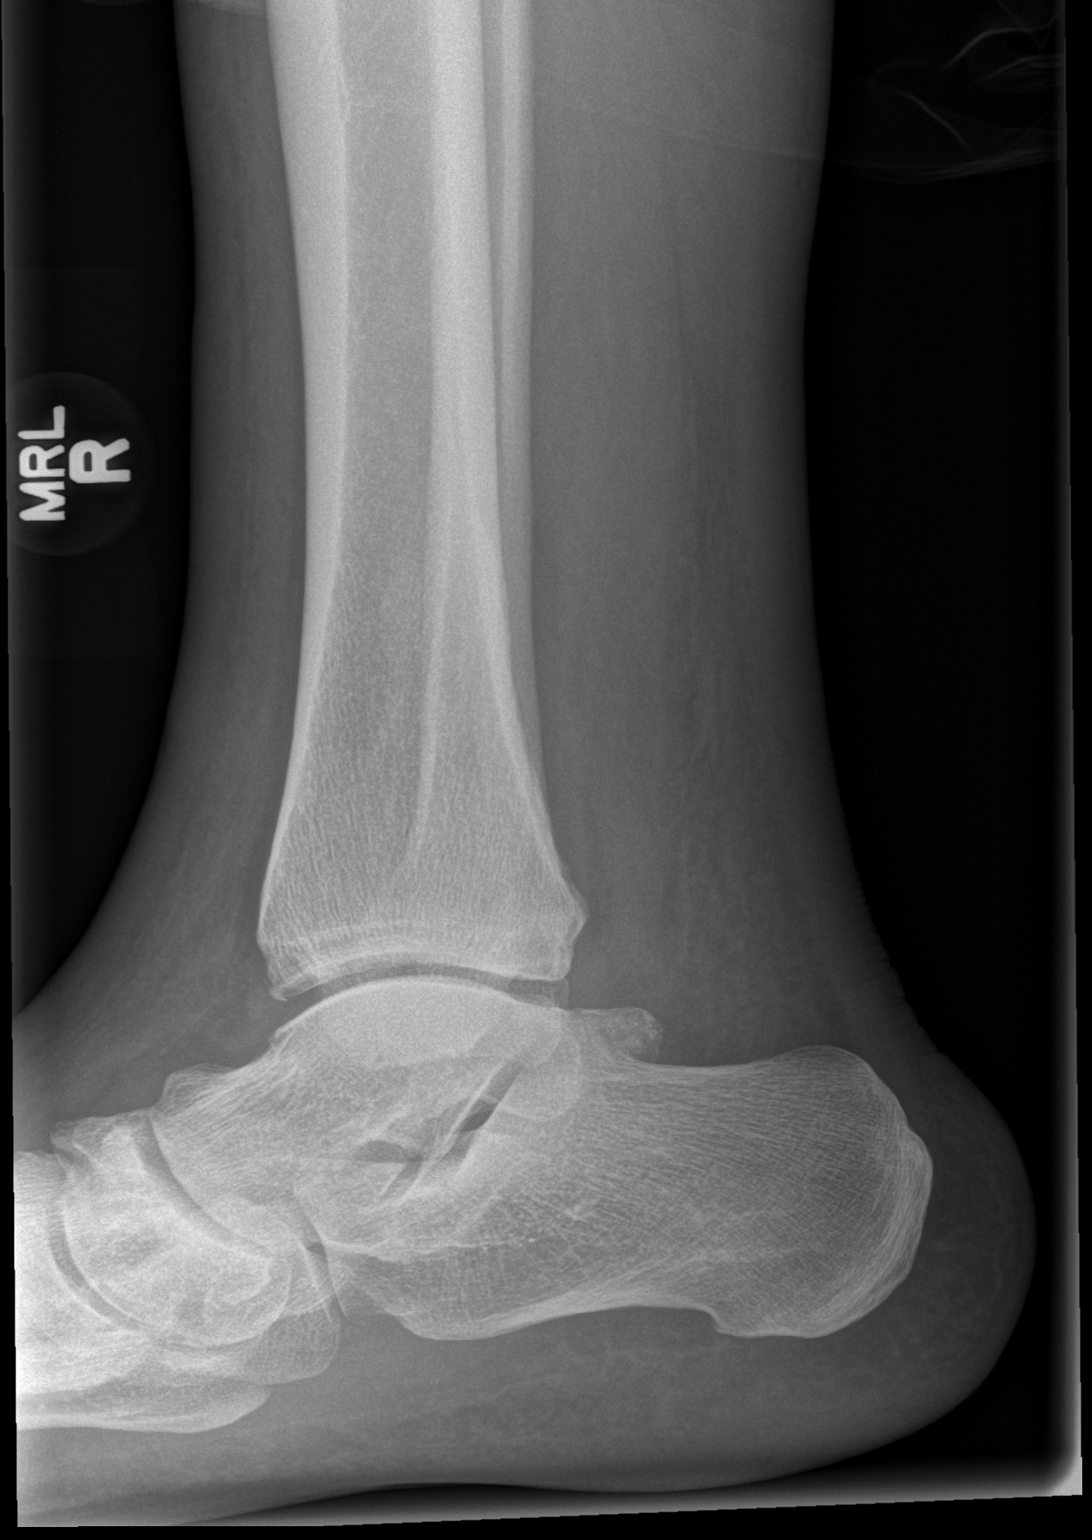

[3 of 3 positions shown; findings below may reference images not displayed]

FINDINGS: Normal alignment and no fracture.  Mild degenerative
change in the ankle joint.  Pes planum.
IMPRESSION: Negative for fracture

## 2012-12-16 ENCOUNTER — Other Ambulatory Visit: Payer: Medicare Other

## 2012-12-16 ENCOUNTER — Institutional Professional Consult (permissible substitution): Payer: Medicare Other | Admitting: Emergency Medicine

## 2012-12-21 ENCOUNTER — Other Ambulatory Visit: Payer: Self-pay

## 2012-12-21 MED ORDER — OXYCODONE HCL 5 MG PO TABS: ORAL_TABLET | ORAL | Status: DC

## 2012-12-21 NOTE — Progress Notes (Signed)
This encounter was created in error - please disregard.

## 2012-12-22 ENCOUNTER — Telehealth: Payer: Self-pay | Admitting: Cardiovascular Disease

## 2012-12-22 NOTE — Telephone Encounter (Signed)
Lmtc./cy

## 2012-12-22 NOTE — Telephone Encounter (Signed)
AFTER MANY TRANSFERS PHONE JUST RINGS AND RINGS./CY

## 2012-12-22 NOTE — Telephone Encounter (Signed)
REVIEWED LAST  OFFICE   NOTE  NOTHING DOCUMENTED   SUGGESTING  NEEDING ANOTHER  LOWER VEN OR ART DUPLEX ./CY

## 2012-12-22 NOTE — Telephone Encounter (Signed)
New Problem   [facility]ist wants to know why this pt needs an ultra sound

## 2012-12-29 ENCOUNTER — Other Ambulatory Visit: Payer: Self-pay | Admitting: *Deleted

## 2012-12-29 MED ORDER — OXYCODONE HCL 5 MG PO TABS: ORAL_TABLET | ORAL | Status: DC

## 2012-12-30 ENCOUNTER — Encounter (HOSPITAL_COMMUNITY): Payer: Self-pay

## 2012-12-30 ENCOUNTER — Inpatient Hospital Stay (HOSPITAL_COMMUNITY)
Admission: EM | Admit: 2012-12-30 | Discharge: 2013-01-04 | DRG: 438 | Disposition: A | Discharge status: Skilled Nursing Facility | Payer: PRIVATE HEALTH INSURANCE | Attending: Internal Medicine | Admitting: Internal Medicine

## 2012-12-30 DIAGNOSIS — Z794 Long term (current) use of insulin: Secondary | ICD-10-CM

## 2012-12-30 DIAGNOSIS — Z9861 Coronary angioplasty status: Secondary | ICD-10-CM

## 2012-12-30 DIAGNOSIS — F411 Generalized anxiety disorder: Secondary | ICD-10-CM | POA: Diagnosis present

## 2012-12-30 DIAGNOSIS — F329 Major depressive disorder, single episode, unspecified: Secondary | ICD-10-CM

## 2012-12-30 DIAGNOSIS — E1165 Type 2 diabetes mellitus with hyperglycemia: Secondary | ICD-10-CM | POA: Diagnosis present

## 2012-12-30 DIAGNOSIS — R6520 Severe sepsis without septic shock: Secondary | ICD-10-CM

## 2012-12-30 DIAGNOSIS — K219 Gastro-esophageal reflux disease without esophagitis: Secondary | ICD-10-CM

## 2012-12-30 DIAGNOSIS — E43 Unspecified severe protein-calorie malnutrition: Secondary | ICD-10-CM

## 2012-12-30 DIAGNOSIS — E785 Hyperlipidemia, unspecified: Secondary | ICD-10-CM | POA: Diagnosis present

## 2012-12-30 DIAGNOSIS — Z79899 Other long term (current) drug therapy: Secondary | ICD-10-CM

## 2012-12-30 DIAGNOSIS — G8929 Other chronic pain: Secondary | ICD-10-CM

## 2012-12-30 DIAGNOSIS — E876 Hypokalemia: Secondary | ICD-10-CM

## 2012-12-30 DIAGNOSIS — E871 Hypo-osmolality and hyponatremia: Secondary | ICD-10-CM | POA: Diagnosis not present

## 2012-12-30 DIAGNOSIS — G4700 Insomnia, unspecified: Secondary | ICD-10-CM

## 2012-12-30 DIAGNOSIS — I252 Old myocardial infarction: Secondary | ICD-10-CM

## 2012-12-30 DIAGNOSIS — I739 Peripheral vascular disease, unspecified: Secondary | ICD-10-CM

## 2012-12-30 DIAGNOSIS — N390 Urinary tract infection, site not specified: Secondary | ICD-10-CM

## 2012-12-30 DIAGNOSIS — I4892 Unspecified atrial flutter: Secondary | ICD-10-CM

## 2012-12-30 DIAGNOSIS — G909 Disorder of the autonomic nervous system, unspecified: Secondary | ICD-10-CM | POA: Diagnosis present

## 2012-12-30 DIAGNOSIS — M48062 Spinal stenosis, lumbar region with neurogenic claudication: Secondary | ICD-10-CM

## 2012-12-30 DIAGNOSIS — I279 Pulmonary heart disease, unspecified: Secondary | ICD-10-CM | POA: Diagnosis present

## 2012-12-30 DIAGNOSIS — I509 Heart failure, unspecified: Secondary | ICD-10-CM | POA: Diagnosis present

## 2012-12-30 DIAGNOSIS — M48061 Spinal stenosis, lumbar region without neurogenic claudication: Secondary | ICD-10-CM

## 2012-12-30 DIAGNOSIS — F1920 Other psychoactive substance dependence, uncomplicated: Secondary | ICD-10-CM | POA: Diagnosis present

## 2012-12-30 DIAGNOSIS — I2510 Atherosclerotic heart disease of native coronary artery without angina pectoris: Secondary | ICD-10-CM

## 2012-12-30 DIAGNOSIS — E119 Type 2 diabetes mellitus without complications: Secondary | ICD-10-CM

## 2012-12-30 DIAGNOSIS — R918 Other nonspecific abnormal finding of lung field: Secondary | ICD-10-CM

## 2012-12-30 DIAGNOSIS — Z8719 Personal history of other diseases of the digestive system: Secondary | ICD-10-CM

## 2012-12-30 DIAGNOSIS — F32A Depression, unspecified: Secondary | ICD-10-CM

## 2012-12-30 DIAGNOSIS — L989 Disorder of the skin and subcutaneous tissue, unspecified: Secondary | ICD-10-CM | POA: Diagnosis present

## 2012-12-30 DIAGNOSIS — R109 Unspecified abdominal pain: Secondary | ICD-10-CM

## 2012-12-30 DIAGNOSIS — E507 Other ocular manifestations of vitamin A deficiency: Secondary | ICD-10-CM

## 2012-12-30 DIAGNOSIS — E1143 Type 2 diabetes mellitus with diabetic autonomic (poly)neuropathy: Secondary | ICD-10-CM

## 2012-12-30 DIAGNOSIS — A419 Sepsis, unspecified organism: Secondary | ICD-10-CM

## 2012-12-30 DIAGNOSIS — B1920 Unspecified viral hepatitis C without hepatic coma: Secondary | ICD-10-CM

## 2012-12-30 DIAGNOSIS — J9600 Acute respiratory failure, unspecified whether with hypoxia or hypercapnia: Secondary | ICD-10-CM

## 2012-12-30 DIAGNOSIS — IMO0002 Reserved for concepts with insufficient information to code with codable children: Secondary | ICD-10-CM

## 2012-12-30 DIAGNOSIS — M545 Low back pain, unspecified: Secondary | ICD-10-CM

## 2012-12-30 DIAGNOSIS — K7689 Other specified diseases of liver: Secondary | ICD-10-CM | POA: Diagnosis present

## 2012-12-30 DIAGNOSIS — K859 Acute pancreatitis without necrosis or infection, unspecified: Principal | ICD-10-CM

## 2012-12-30 DIAGNOSIS — I10 Essential (primary) hypertension: Secondary | ICD-10-CM

## 2012-12-30 DIAGNOSIS — K861 Other chronic pancreatitis: Secondary | ICD-10-CM | POA: Diagnosis present

## 2012-12-30 DIAGNOSIS — G9340 Encephalopathy, unspecified: Secondary | ICD-10-CM

## 2012-12-30 DIAGNOSIS — R339 Retention of urine, unspecified: Secondary | ICD-10-CM

## 2012-12-30 DIAGNOSIS — F4310 Post-traumatic stress disorder, unspecified: Secondary | ICD-10-CM | POA: Diagnosis present

## 2012-12-30 DIAGNOSIS — Z6834 Body mass index (BMI) 34.0-34.9, adult: Secondary | ICD-10-CM

## 2012-12-30 DIAGNOSIS — F1120 Opioid dependence, uncomplicated: Secondary | ICD-10-CM

## 2012-12-30 DIAGNOSIS — Z86718 Personal history of other venous thrombosis and embolism: Secondary | ICD-10-CM

## 2012-12-30 DIAGNOSIS — E1149 Type 2 diabetes mellitus with other diabetic neurological complication: Secondary | ICD-10-CM | POA: Diagnosis present

## 2012-12-30 DIAGNOSIS — R609 Edema, unspecified: Secondary | ICD-10-CM

## 2012-12-30 DIAGNOSIS — Z87891 Personal history of nicotine dependence: Secondary | ICD-10-CM

## 2012-12-30 HISTORY — DX: Opioid dependence, uncomplicated: F11.20

## 2012-12-30 HISTORY — DX: Reserved for concepts with insufficient information to code with codable children: IMO0002

## 2012-12-30 HISTORY — DX: Unspecified viral hepatitis C without hepatic coma: B19.20

## 2012-12-30 LAB — COMPREHENSIVE METABOLIC PANEL
ALT: 12 U/L (ref 0–53)
AST: 12 U/L (ref 0–37)
Albumin: 3.6 g/dL (ref 3.5–5.2)
CO2: 28 mEq/L (ref 19–32)
Calcium: 10.2 mg/dL (ref 8.4–10.5)
Chloride: 93 mEq/L — ABNORMAL LOW (ref 96–112)
GFR calc non Af Amer: 87 mL/min — ABNORMAL LOW (ref >90)
Sodium: 137 mEq/L (ref 135–145)
Total Bilirubin: 0.2 mg/dL — ABNORMAL LOW (ref 0.3–1.2)

## 2012-12-30 LAB — CBC
MCH: 31.6 pg (ref 26.0–34.0)
MCV: 90.5 fL (ref 78.0–100.0)
Platelets: 291 10*3/uL (ref 150–400)
RDW: 14.1 % (ref 11.5–15.5)
WBC: 7.7 10*3/uL (ref 4.0–10.5)

## 2012-12-30 LAB — LIPASE, BLOOD: Lipase: 176 U/L — ABNORMAL HIGH (ref 11–59)

## 2012-12-30 MED ORDER — HYDROMORPHONE HCL PF 1 MG/ML IJ SOLN
1.0000 mg | Freq: Once | INTRAMUSCULAR | Status: AC
  Administered 2012-12-30: 1 mg via INTRAVENOUS
  Filled 2012-12-30: qty 1

## 2012-12-30 MED ORDER — ONDANSETRON 8 MG PO TBDP
8.0000 mg | ORAL_TABLET | Freq: Three times a day (TID) | ORAL | Status: DC | PRN
  Filled 2012-12-30: qty 1

## 2012-12-30 MED ORDER — SODIUM CHLORIDE 0.9 % IV SOLN
INTRAVENOUS | Status: DC
  Administered 2012-12-30 – 2012-12-31 (×2): via INTRAVENOUS

## 2012-12-30 MED ORDER — OXYCODONE HCL ER 15 MG PO T12A
30.0000 mg | EXTENDED_RELEASE_TABLET | Freq: Two times a day (BID) | ORAL | Status: DC
  Administered 2012-12-30 – 2013-01-04 (×10): 30 mg via ORAL
  Filled 2012-12-30 (×10): qty 2

## 2012-12-30 MED ORDER — ONDANSETRON HCL 4 MG PO TABS: 4.0000 mg | ORAL_TABLET | Freq: Four times a day (QID) | ORAL | Status: DC | PRN

## 2012-12-30 MED ORDER — DULOXETINE HCL 60 MG PO CPEP: 60.0000 mg | ORAL_CAPSULE | Freq: Every day | ORAL | Status: DC

## 2012-12-30 MED ORDER — DULOXETINE HCL 20 MG PO CPEP: 20.0000 mg | ORAL_CAPSULE | Freq: Every day | ORAL | Status: DC

## 2012-12-30 MED ORDER — INSULIN ASPART 100 UNIT/ML ~~LOC~~ SOLN
0.0000 [IU] | Freq: Three times a day (TID) | SUBCUTANEOUS | Status: DC
  Administered 2012-12-31: 18:00:00 8 [IU] via SUBCUTANEOUS
  Administered 2012-12-31 (×2): 3 [IU] via SUBCUTANEOUS
  Administered 2013-01-01: 09:00:00 15 [IU] via SUBCUTANEOUS

## 2012-12-30 MED ORDER — DILTIAZEM HCL ER COATED BEADS 180 MG PO CP24
180.0000 mg | ORAL_CAPSULE | Freq: Every day | ORAL | Status: DC
  Administered 2012-12-31 – 2013-01-03 (×4): 180 mg via ORAL
  Filled 2012-12-30 (×4): qty 1

## 2012-12-30 MED ORDER — TRAZODONE HCL 100 MG PO TABS
100.0000 mg | ORAL_TABLET | Freq: Every day | ORAL | Status: DC
  Administered 2012-12-30 – 2013-01-03 (×5): 100 mg via ORAL
  Filled 2012-12-30 (×6): qty 1

## 2012-12-30 MED ORDER — POLYETHYLENE GLYCOL 3350 17 G PO PACK
17.0000 g | PACK | Freq: Every day | ORAL | Status: DC
  Administered 2012-12-30 – 2013-01-04 (×5): 17 g via ORAL
  Filled 2012-12-30 (×6): qty 1

## 2012-12-30 MED ORDER — ONDANSETRON 4 MG PO TBDP: 4.0000 mg | ORAL_TABLET | Freq: Once | ORAL | Status: DC

## 2012-12-30 MED ORDER — LACTULOSE 10 GM/15ML PO SOLN
20.0000 g | Freq: Three times a day (TID) | ORAL | Status: DC
  Administered 2012-12-30 – 2013-01-04 (×14): 20 g via ORAL
  Filled 2012-12-30 (×16): qty 30

## 2012-12-30 MED ORDER — OXYCODONE HCL ER 30 MG PO T12A: 30.0000 mg | EXTENDED_RELEASE_TABLET | Freq: Two times a day (BID) | ORAL | Status: DC

## 2012-12-30 MED ORDER — LOSARTAN POTASSIUM 50 MG PO TABS
50.0000 mg | ORAL_TABLET | Freq: Every day | ORAL | Status: DC
  Administered 2012-12-31 – 2013-01-03 (×4): 50 mg via ORAL
  Filled 2012-12-30 (×5): qty 1

## 2012-12-30 MED ORDER — ALPRAZOLAM 0.5 MG PO TABS
0.5000 mg | ORAL_TABLET | Freq: Two times a day (BID) | ORAL | Status: DC
  Administered 2012-12-30 – 2013-01-04 (×10): 0.5 mg via ORAL
  Filled 2012-12-30 (×10): qty 1

## 2012-12-30 MED ORDER — HYDROMORPHONE HCL PF 1 MG/ML IJ SOLN: 1.0000 mg | INTRAMUSCULAR | Status: DC | PRN

## 2012-12-30 MED ORDER — ONDANSETRON HCL 4 MG/2ML IJ SOLN
4.0000 mg | Freq: Four times a day (QID) | INTRAMUSCULAR | Status: DC | PRN
  Administered 2013-01-04: 03:00:00 4 mg via INTRAVENOUS
  Filled 2012-12-30: qty 2

## 2012-12-30 MED ORDER — HYDROMORPHONE HCL PF 1 MG/ML IJ SOLN
2.0000 mg | INTRAMUSCULAR | Status: DC | PRN
  Administered 2012-12-30 – 2013-01-01 (×11): 2 mg via INTRAVENOUS
  Filled 2012-12-30 (×12): qty 2

## 2012-12-30 MED ORDER — ACETAMINOPHEN 650 MG RE SUPP: 650.0000 mg | Freq: Four times a day (QID) | RECTAL | Status: DC | PRN

## 2012-12-30 MED ORDER — ONDANSETRON HCL 4 MG/2ML IJ SOLN
4.0000 mg | Freq: Once | INTRAMUSCULAR | Status: AC
  Administered 2012-12-30: 4 mg via INTRAVENOUS
  Filled 2012-12-30: qty 2

## 2012-12-30 MED ORDER — ACETAMINOPHEN 325 MG PO TABS: 650.0000 mg | ORAL_TABLET | Freq: Four times a day (QID) | ORAL | Status: DC | PRN

## 2012-12-30 MED ORDER — RIVAROXABAN 20 MG PO TABS
20.0000 mg | ORAL_TABLET | Freq: Every day | ORAL | Status: DC
  Administered 2012-12-30 – 2013-01-03 (×5): 20 mg via ORAL
  Filled 2012-12-30 (×6): qty 1

## 2012-12-30 MED ORDER — SODIUM CHLORIDE 0.9 % IV BOLUS (SEPSIS)
500.0000 mL | Freq: Once | INTRAVENOUS | Status: AC
  Administered 2012-12-30: 500 mL via INTRAVENOUS

## 2012-12-30 MED ORDER — ONDANSETRON HCL 4 MG/2ML IJ SOLN: 4.0000 mg | Freq: Three times a day (TID) | INTRAMUSCULAR | Status: DC | PRN

## 2012-12-30 MED ORDER — DULOXETINE HCL 60 MG PO CPEP
80.0000 mg | ORAL_CAPSULE | Freq: Every day | ORAL | Status: DC
  Administered 2012-12-31 – 2013-01-04 (×5): 80 mg via ORAL
  Filled 2012-12-30 (×5): qty 1

## 2012-12-30 MED ORDER — GABAPENTIN 300 MG PO CAPS
300.0000 mg | ORAL_CAPSULE | Freq: Every day | ORAL | Status: DC
  Administered 2012-12-30 – 2013-01-03 (×5): 300 mg via ORAL
  Filled 2012-12-30 (×6): qty 1

## 2012-12-30 MED ORDER — PANTOPRAZOLE SODIUM 40 MG PO TBEC
40.0000 mg | DELAYED_RELEASE_TABLET | Freq: Every day | ORAL | Status: DC
  Administered 2012-12-31 – 2013-01-04 (×5): 40 mg via ORAL
  Filled 2012-12-30 (×5): qty 1

## 2012-12-30 NOTE — H&P (Signed)
[facility] History and Physical  REYNOLD MANTELL ZOX:[id number] DOB: October 02, 1950 DOA: 12/30/2012  Referring physician: Modesto Charon PCP: Warrick Parisian, MD  Specialists:   Chief Complaint: Acute pancreatitis  HPI: Ryan [name] is a 62 y.o. male is a nursing home resident, in [place], sent from over there because of abdominal pain. Apparently patient was diagnosed with ileus and mild acute on chronic pancreatitis and he was treated conservatively. His abdominal pain was not improving as well as his lipase so he was sent to the emergency department for further evaluation. Initial evaluation in the emergency department showed a lipase of 176. When I was called from the emergency department physician there was no blood work done in the hospital, but patient has blood work done today at his nursing home.  Review of Systems:  Constitutional: negative for anorexia, fevers and sweats Eyes: negative for irritation, redness and visual disturbance Ears, nose, mouth, throat, and face: negative for earaches, epistaxis, nasal congestion and sore throat Respiratory: negative for cough, dyspnea on exertion, sputum and wheezing Cardiovascular: negative for chest pain, dyspnea, lower extremity edema, orthopnea, palpitations and syncope Gastrointestinal: negative for abdominal pain, constipation, diarrhea, melena, nausea and vomiting Genitourinary:negative for dysuria, frequency and hematuria Hematologic/lymphatic: negative for bleeding, easy bruising and lymphadenopathy Musculoskeletal:negative for arthralgias, muscle weakness and stiff joints Neurological: negative for coordination problems, gait problems, headaches and weakness Endocrine: negative for diabetic symptoms including polydipsia, polyuria and weight loss Allergic/Immunologic: negative for anaphylaxis, hay fever and urticaria  Past Medical History  Diagnosis Date  . Hypertension   . DVT (deep venous thrombosis) ~ 2011    BLE  .  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 Hyperlipidemia   . CAD (coronary atherosclerotic disease)     s/p stents x2  . Small bowel obstruction   . Pancreatitis   . Anxiety   . GERD (gastroesophageal reflux disease)   . CHF (congestive heart failure)   . Myocardial infarction ~ 2009  . Anginal pain   . Pneumonia ~ 2012  . Shortness of breath     "lying down & w/exertion" (03/04/2012)  . Type II diabetes mellitus   . Hep B w/o coma 1970's    "I was in [place]" (03/04/2012)  . Chronic lower back pain   . PTSD (post-traumatic stress disorder)   . Hernia   . Narcotic addiction    Past Surgical History  Procedure Laterality Date  . Esophagogastroduodenoscopy  09/22/2011    Procedure: ESOPHAGOGASTRODUODENOSCOPY (EGD);  Surgeon: Charna Elizabeth, MD;  Location: [facility] ENDOSCOPY;  Service: Endoscopy;  Laterality: N/A;  . Esophagogastroduodenoscopy  01/05/2012    Procedure: ESOPHAGOGASTRODUODENOSCOPY (EGD);  Surgeon: Rachael Fee, MD;  Location: [facility] ENDOSCOPY;  Service: Endoscopy;  Laterality: N/A;  . Cholecystectomy  ~ 2009  . Spinal growth rods  ~ 2011  . Tonsillectomy and adenoidectomy    . Coronary angioplasty with stent placement  ~ 2009`    "1 + ! (after 1st one failed)" (03/04/2012)   Social History:  reports that he quit smoking about 6 months ago. His smoking use included Cigarettes. He has a 19 pack-year smoking history. He has never used smokeless tobacco. He reports that  drinks alcohol. He reports that he uses illicit drugs (Marijuana).  Allergies  Allergen Reactions  . Lisinopril Swelling and Rash  . Morphine And Related Swelling and Rash    Family History  Problem Relation Age of Onset  . Diabetes Father   . Colon cancer  Neg Hx    Prior to Admission medications   Medication Sig Start Date End Date Taking? Authorizing Provider  ALPRAZolam Prudy Feeler) 0.5 MG tablet Take 1 tablet (0.5 mg total) by mouth 2 (two) times daily. 11/17/12  Yes Kimber Relic, MD   Cholecalciferol (VITAMIN D3) 2000 UNITS TABS Take 1 capsule by mouth daily.   Yes Historical Provider, MD  diltiazem (CARDIZEM CD) 180 MG 24 hr capsule Take 180 mg by mouth daily.   Yes Historical Provider, MD  DULoxetine (CYMBALTA) 20 MG capsule Take 20 mg by mouth daily.   Yes Historical Provider, MD  DULoxetine (CYMBALTA) 60 MG capsule Take 60 mg by mouth daily.   Yes Historical Provider, MD  Emollient (HYDROPHOR) OINT Apply 1 application topically as directed. Apply as directed to bilateral knees to feet for dry skin   Yes Historical Provider, MD  gabapentin (NEURONTIN) 300 MG capsule Take 300 mg by mouth at bedtime.   Yes Historical Provider, MD  Hypromellose (ARTIFICIAL TEARS OP) Place 1 drop into the right eye 3 (three) times daily.    Yes Historical Provider, MD  insulin glargine (LANTUS) 100 UNIT/ML injection Inject 25 Units into the skin at bedtime.    Yes Historical Provider, MD  insulin glulisine (APIDRA) 100 UNIT/ML injection Inject 5 Units into the skin 2 (two) times daily. Prior to lunch and supper   Yes Historical Provider, MD  lactulose (CHRONULAC) 10 GM/15ML solution Take 30 mLs by mouth 3 (three) times daily.    Yes Historical Provider, MD  Melatonin 3 MG TABS Take 1 tablet by mouth at bedtime.    Yes Historical Provider, MD  metFORMIN (GLUCOPHAGE) 850 MG tablet Take 850 mg by mouth 3 (three) times daily with meals.   Yes Historical Provider, MD  ondansetron (ZOFRAN-ODT) 8 MG disintegrating tablet Take 8 mg by mouth every 8 (eight) hours as needed for nausea.   Yes Historical Provider, MD  oxyCODONE (OXY IR/ROXICODONE) 5 MG immediate release tablet 3 by mouth every 6 hours as needed for pain 12/29/12  Yes Mahima Pandey, MD  oxycodone (ROXICODONE) 30 MG immediate release tablet Take one tablet by mouth every 12 hours for pain. 12/08/12  Yes Claudie Revering, NP  OxyCODONE HCl ER (OXYCONTIN) 30 MG T12A Take 30 mg by mouth every 12 (twelve) hours.   Yes Historical Provider, MD   pantoprazole (PROTONIX) 40 MG tablet Take 40 mg by mouth daily.   Yes Historical Provider, MD  polyethylene glycol (MIRALAX / GLYCOLAX) packet Take 17 g by mouth daily.   Yes Historical Provider, MD  Rivaroxaban (XARELTO) 20 MG TABS Take 1 tablet (20 mg total) by mouth daily. 11/10/12  Yes Wendall Stade, MD  sennosides-docusate sodium (SENOKOT-S) 8.6-50 MG tablet Take 1 tablet by mouth 2 (two) times daily.   Yes Historical Provider, MD  torsemide (DEMADEX) 20 MG tablet Take 60 mg by mouth daily.   Yes Historical Provider, MD  traZODone (DESYREL) 100 MG tablet Take 100 mg by mouth at bedtime.   Yes Historical Provider, MD  losartan (COZAAR) 50 MG tablet Take 50 mg by mouth daily.    Historical Provider, MD   Physical Exam: Filed Vitals:   12/30/12 1847  BP: 117/75  Pulse: 86  Temp:   Resp: 16   General appearance: alert, cooperative and no distress  Head: Normocephalic, without obvious abnormality, atraumatic  Eyes: conjunctivae/corneas clear. PERRL, EOM's intact. Fundi benign.  Nose: Nares normal. Septum midline. Mucosa normal. No drainage or sinus  tenderness.  Throat: lips, mucosa, and tongue normal; teeth and gums normal  Neck: Supple, no masses, no cervical lymphadenopathy, no JVD appreciated, no meningeal signs Resp: clear to auscultation bilaterally  Chest wall: no tenderness  Cardio: regular rate and rhythm, S1, S2 normal, no murmur, click, rub or gallop  GI: soft, non-tender; bowel sounds normal; no masses, no organomegaly  Extremities: extremities normal, atraumatic, no cyanosis or edema  Skin: Skin color, texture, turgor normal. No rashes or lesions  Neurologic: Alert and oriented X 3, normal strength and tone. Normal symmetric reflexes. Normal coordination and gait   Labs on Admission:  Basic Metabolic Panel:  Recent Labs Lab 12/30/12 1810  NA 137  K 3.6  CL 93*  CO2 28  GLUCOSE 219*  BUN 16  CREATININE 0.99  CALCIUM 10.2   Liver Function Tests:  Recent  Labs Lab 12/30/12 1810  AST 12  ALT 12  ALKPHOS 74  BILITOT 0.2*  PROT 8.2  ALBUMIN 3.6    Recent Labs Lab 12/30/12 1810  LIPASE 176*   No results found for this basename: AMMONIA,  in the last 168 hours CBC:  Recent Labs Lab 12/30/12 1810  WBC 7.7  HGB 16.0  HCT 45.8  MCV 90.5  PLT 291   Cardiac Enzymes: No results found for this basename: CKTOTAL, CKMB, CKMBINDEX, TROPONINI,  in the last 168 hours  BNP (last 3 results)  Recent Labs  04/27/12 0951 08/15/12 0420  PROBNP 78.2 753.0*   CBG: No results found for this basename: GLUCAP,  in the last 168 hours  Radiological Exams on Admission: No results found.  EKG: Independently reviewed.  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 -His lipase is slightly elevated at 176, he had history of cholecystectomy. -Treat conservatively, place patient n.p.o. and hydrate with IV fluids. -Control pain medications with IV Dilaudid as patient allergic to morphine. -I will obtain CT scan of abdomen and pelvis to rule out complications of his pancreatitis. -Reported also that he had ileus which improved, and it could be secondary to sentinel loop secondary to pancreatitis.  Diabetes mellitus -Patient is on Lantus insulin, will hold because patient n.p.o. -Start patient on insulin sliding scale. -Restart Lantus on patient able to tolerate diet.  Chronic pain/narcotic dependence -Restart home long-acting narcotics.  Atrial flutter -Rate is controlled, check EKG. -Patient is on Xarelto for anticoagulation.  Code Status: Full code Family Communication: Plan discussed with the patient Disposition Plan: Inpatient, MedSurg, anticipate length of  stay to be greater than 2 midnights  Time spent: 70 minutes  [facility] Pager [phone]  If 7PM-7AM, please contact night-coverage [email or web address] Password [email or web address] 12/30/2012, 7:08 PM

## 2012-12-30 NOTE — ED Provider Notes (Signed)
CSN: [id number]     Arrival date & time 12/30/12  1733 History   First MD Initiated Contact with Patient 12/30/12 1733     No chief complaint on file.  (Consider location/radiation/quality/duration/timing/severity/associated sxs/prior Treatment) Patient is a 62 y.o. male presenting with abdominal pain. The history is provided by the patient and the EMS personnel. No language interpreter was used.  Abdominal Pain Pain location:  Periumbilical and epigastric Pain quality: sharp and stabbing   Pain radiates to:  Does not radiate Pain severity:  Severe Onset quality:  Gradual Duration:  3 days Timing:  Constant Progression:  Unchanged Chronicity:  Chronic Context: not alcohol use, not recent illness, not recent travel, not suspicious food intake and not trauma   Relieved by:  Nothing Worsened by:  Palpation and eating Ineffective treatments:  None tried Associated symptoms: constipation, flatus and nausea   Associated symptoms: no chest pain, no cough, no diarrhea, no dysuria, no fever, no hematuria, no shortness of breath, no sore throat and no vomiting   Risk factors: being elderly   Risk factors: no alcohol abuse     Past Medical History  Diagnosis Date  . Hypertension   . DVT (deep venous thrombosis) ~ 2011    BLE  . 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 Hyperlipidemia   . CAD (coronary atherosclerotic disease)     s/p stents x2  . Small bowel obstruction   . Pancreatitis   . Anxiety   . GERD (gastroesophageal reflux disease)   . CHF (congestive heart failure)   . Myocardial infarction ~ 2009  . Anginal pain   . Pneumonia ~ 2012  . Shortness of breath     "lying down & w/exertion" (03/04/2012)  . Type II diabetes mellitus   . Hep B w/o coma 1970's    "I was in [place]" (03/04/2012)  . Chronic lower back pain   . PTSD (post-traumatic stress disorder)   . Hernia    Past Surgical History  Procedure Laterality Date  . Esophagogastroduodenoscopy   09/22/2011    Procedure: ESOPHAGOGASTRODUODENOSCOPY (EGD);  Surgeon: Charna Elizabeth, MD;  Location: [facility] ENDOSCOPY;  Service: Endoscopy;  Laterality: N/A;  . Esophagogastroduodenoscopy  01/05/2012    Procedure: ESOPHAGOGASTRODUODENOSCOPY (EGD);  Surgeon: Rachael Fee, MD;  Location: [facility] ENDOSCOPY;  Service: Endoscopy;  Laterality: N/A;  . Cholecystectomy  ~ 2009  . Spinal growth rods  ~ 2011  . Tonsillectomy and adenoidectomy    . Coronary angioplasty with stent placement  ~ 2009`    "1 + ! (after 1st one failed)" (03/04/2012)   Family History  Problem Relation Age of Onset  . Diabetes Father   . Colon cancer Neg Hx    History  Substance Use Topics  . Smoking status: Former Smoker -- 0.50 packs/day for 38 years    Types: Cigarettes    Quit date: 06/24/2012  . Smokeless tobacco: Never Used     Comment: just recently quit 3-4 months ago  . 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  HENT: Negative for congestion, sore throat and rhinorrhea.   Respiratory: Negative for cough and shortness of breath.   Cardiovascular: Negative for chest pain.  Gastrointestinal: Positive for nausea, abdominal pain, constipation and flatus. Negative for vomiting and diarrhea.  Genitourinary: Negative for dysuria and hematuria.  Skin: Negative for rash.  Neurological: Negative for syncope, light-headedness and headaches.  All other systems reviewed and are negative.    Allergies  Lisinopril and Morphine and related  Home Medications   Current Outpatient Rx  Name  Route  Sig  Dispense  Refill  . ALPRAZolam (XANAX) 0.5 MG tablet   Oral   Take 1 tablet (0.5 mg total) by mouth 2 (two) times daily.   60 tablet   5   . aspirin 81 MG tablet   Oral   Take 81 mg by mouth daily.         Marland Kitchen diltiazem (CARDIZEM CD) 240 MG 24 hr capsule   Oral   Take 180 mg  by mouth daily.         . DULoxetine (CYMBALTA) 60 MG capsule   Oral   Take 60 mg by mouth daily.         . Emollient (HYDROPHOR) OINT   Apply externally   Apply 1 application topically 2 (two) times daily.         . furosemide (LASIX) 20 MG tablet   Oral   Take 60 mg by mouth daily.         Marland Kitchen glipiZIDE (GLUCOTROL XL) 10 MG 24 hr tablet   Oral   Take 10 mg by mouth 2 (two) times daily.         . Hypromellose (ARTIFICIAL TEARS OP)   Ophthalmic   Apply 1 drop to eye 3 (three) times daily.         . insulin glargine (LANTUS) 100 UNIT/ML injection   Subcutaneous   Inject 20 Units into the skin at bedtime.          . insulin glulisine (APIDRA) 100 UNIT/ML injection   Subcutaneous   Inject 5 Units into the skin 2 (two) times daily. Prior to lunch and supper         . lactulose (CHRONULAC) 10 GM/15ML solution   Oral   Take 30 mLs by mouth 2 (two) times daily.         Marland Kitchen losartan (COZAAR) 50 MG tablet   Oral   Take 50 mg by mouth daily.         . Melatonin 3 MG TABS   Oral   Take 1 tablet by mouth at bedtime as needed.         . metFORMIN (GLUCOPHAGE) 1000 MG tablet   Oral   Take 1,000 mg by mouth 2 (two) times daily with a meal.         . oxyCODONE (OXY IR/ROXICODONE) 5 MG immediate release tablet      3 by mouth every 6 hours as needed for pain   30 tablet   o   . OxyCODONE (OXYCONTIN) 20 mg T12A   Oral   Take 1 tablet (20 mg total) by mouth every 12 (twelve) hours.   60 tablet   0   . oxycodone (ROXICODONE) 30 MG immediate release tablet      Take one tablet by mouth every 12 hours for pain.   60 tablet   0   . pantoprazole (PROTONIX) 40 MG tablet   Oral   Take 40 mg by mouth daily.         . Rivaroxaban (XARELTO) 20 MG TABS   Oral   Take 1 tablet (20 mg total) by mouth daily.   30 tablet   11   .  sennosides-docusate sodium (SENOKOT-S) 8.6-50 MG tablet   Oral   Take 2 tablets by mouth daily. For constipation   60  tablet   0   . traZODone (DESYREL) 100 MG tablet   Oral   Take 100 mg by mouth at bedtime.          BP 126/90  Pulse 98  Temp(Src) 97.8 F (36.6 C) (Oral)  Resp 20  SpO2 99% Physical Exam  Nursing note and vitals reviewed. Constitutional: He is oriented to person, place, and time. He appears well-developed and well-nourished.  HENT:  Head: Normocephalic and atraumatic.  Right Ear: External ear normal.  Left Ear: External ear normal.  Eyes: EOM are normal. Pupils are equal, round, and reactive to light.  Neck: Normal range of motion. Neck supple.  Cardiovascular: Normal rate, regular rhythm and intact distal pulses.  Exam reveals no gallop and no friction rub.   No murmur heard. Pulmonary/Chest: Effort normal. No respiratory distress. He has no wheezes. He has rales (in the bases). He exhibits no tenderness.  Abdominal: Soft. Bowel sounds are normal. He exhibits no distension. There is tenderness. There is no rebound.  Musculoskeletal: Normal range of motion. He exhibits no edema and no tenderness.  Lymphadenopathy:    He has no cervical adenopathy.  Neurological: He is alert and oriented to person, place, and time.  Skin: Skin is warm. No rash noted.  Psychiatric: He has a normal mood and affect. His behavior is normal.    ED Course  Procedures (including critical care time) Labs Review Labs Reviewed  COMPREHENSIVE METABOLIC PANEL - Abnormal; Notable for the following:    Chloride 93 (*)    Glucose, Bld 219 (*)    Total Bilirubin 0.2 (*)    GFR calc non Af Amer 87 (*)    All other components within normal limits  LIPASE, BLOOD - Abnormal; Notable for the following:    Lipase 176 (*)    All other components within normal limits  CBC   Imaging Review No results found.  MDM  No diagnosis found. 5:33 PM Pt is a 62 y.o. male with pertinent PMHX of HTN,DVT, CAD s/p stenting, pancreatitis, GERD, DM who presents to the ED with abdominal pain. Abdominal pain for 3  days. Described as sharp and stabbing in nature in epigastrum and umbilicus without radiation. Pain located in epigastrum and periumbilical, constant with no relieving factors and worse with eating,palpation. Pt endorses nausea. Denies vomiting and Diarrhea. Denies Fever. Last BM 2 days ago which is normal On on medications that increase constipation. No history of SBO. Pt stated symptoms similar to previous flares of pancreatitis. Pt's etiology of pancreatitis from gallstones, denies ETOH abuse.  On exam: AFVSS, abdomen TTP epigastrum, no peritoneal signs, normal bowel sounds.Plan for: CBC, CMP, lipase. Will give zofran for nausea and dilaudid for pain. Given history of CHF gentle bolus of fluids 500cc.  Review of labs from [facility]: WBC 7.1, H&H 14.9/47.0, platelets 314. Na 133, K 3.7, BUN 19, cr 1.0, Cl 92, CO2 32, glucose 168, AST 13, ALT 10. Alk Phos 68, Lipase 228.  Plan to consult Hospitalist for admission for pain control and further management. Given scan in March and no alarming symptoms will hold off on CT abdomen pelvis.  Review of labs: Lipase 176. CMP showed hypochloremia, no other electrolyte abnormalities. CBC showed no leukocytosis, H&H stable  The patient appears reasonably stabilized for admission considering the current resources, flow, and capabilities available in  the ED at this time, and I doubt any other [facility] requiring further screening and/or treatment in the ED prior to admission.   Plan for admission to [facility] for further evaluation and management. PT transferred to floor VSS.  Labs, reviewed by myself and considered in medical decision making if ordered.   Pt was discussed with my attending, Dr. Erlene Quan, MD 12/30/12 [phone]

## 2012-12-30 NOTE — ED Notes (Signed)
Per [facility], pt from [facility] for abd pain around the umbilicus area for the past several days and increasing in pain. . No BM for 3 days which is usual for him.

## 2012-12-31 ENCOUNTER — Inpatient Hospital Stay (HOSPITAL_COMMUNITY): Payer: PRIVATE HEALTH INSURANCE

## 2012-12-31 ENCOUNTER — Encounter (HOSPITAL_COMMUNITY): Payer: Self-pay | Admitting: Radiology

## 2012-12-31 DIAGNOSIS — K219 Gastro-esophageal reflux disease without esophagitis: Secondary | ICD-10-CM

## 2012-12-31 DIAGNOSIS — E876 Hypokalemia: Secondary | ICD-10-CM | POA: Diagnosis not present

## 2012-12-31 LAB — CBC
HCT: 46.1 % (ref 39.0–52.0)
Hemoglobin: 15 g/dL (ref 13.0–17.0)
MCV: 92.6 fL (ref 78.0–100.0)
Platelets: 271 10*3/uL (ref 150–400)
RBC: 4.98 MIL/uL (ref 4.22–5.81)
WBC: 6.6 10*3/uL (ref 4.0–10.5)

## 2012-12-31 LAB — COMPREHENSIVE METABOLIC PANEL
Albumin: 3.5 g/dL (ref 3.5–5.2)
BUN: 16 mg/dL (ref 6–23)
Calcium: 9.9 mg/dL (ref 8.4–10.5)
Creatinine, Ser: 0.92 mg/dL (ref 0.50–1.35)
Total Bilirubin: 0.2 mg/dL — ABNORMAL LOW (ref 0.3–1.2)
Total Protein: 7.9 g/dL (ref 6.0–8.3)

## 2012-12-31 LAB — GLUCOSE, CAPILLARY
Glucose-Capillary: 177 mg/dL — ABNORMAL HIGH (ref 70–99)
Glucose-Capillary: 197 mg/dL — ABNORMAL HIGH (ref 70–99)
Glucose-Capillary: 220 mg/dL — ABNORMAL HIGH (ref 70–99)

## 2012-12-31 LAB — MAGNESIUM: Magnesium: 1.4 mg/dL — ABNORMAL LOW (ref 1.5–2.5)

## 2012-12-31 LAB — MRSA PCR SCREENING: MRSA by PCR: POSITIVE — AB

## 2012-12-31 MED ORDER — BOOST / RESOURCE BREEZE PO LIQD: 1.0000 | Freq: Every day | ORAL | Status: DC

## 2012-12-31 MED ORDER — BOOST / RESOURCE BREEZE PO LIQD
1.0000 | Freq: Every day | ORAL | Status: DC
  Administered 2012-12-31 – 2013-01-03 (×4): 1 via ORAL

## 2012-12-31 MED ORDER — MAGNESIUM SULFATE 40 MG/ML IJ SOLN
2.0000 g | Freq: Once | INTRAMUSCULAR | Status: AC
  Administered 2012-12-31: 2 g via INTRAVENOUS
  Filled 2012-12-31: qty 50

## 2012-12-31 MED ORDER — CHLORHEXIDINE GLUCONATE CLOTH 2 % EX PADS
6.0000 | MEDICATED_PAD | Freq: Every day | CUTANEOUS | Status: AC
  Administered 2012-12-31 – 2013-01-04 (×5): 6 via TOPICAL

## 2012-12-31 MED ORDER — POTASSIUM CHLORIDE 10 MEQ/100ML IV SOLN
10.0000 meq | INTRAVENOUS | Status: AC
  Administered 2012-12-31 (×4): 10 meq via INTRAVENOUS
  Filled 2012-12-31 (×4): qty 100

## 2012-12-31 MED ORDER — INSULIN GLARGINE 100 UNIT/ML ~~LOC~~ SOLN
10.0000 [IU] | Freq: Every day | SUBCUTANEOUS | Status: DC
  Administered 2012-12-31: 10 [IU] via SUBCUTANEOUS
  Filled 2012-12-31 (×2): qty 0.1

## 2012-12-31 MED ORDER — MUPIROCIN 2 % EX OINT
1.0000 "application " | TOPICAL_OINTMENT | Freq: Two times a day (BID) | CUTANEOUS | Status: DC
  Administered 2012-12-31 – 2013-01-04 (×9): 1 via NASAL
  Filled 2012-12-31 (×2): qty 22

## 2012-12-31 MED ORDER — HYDROMORPHONE HCL PF 1 MG/ML IJ SOLN
1.0000 mg | Freq: Once | INTRAMUSCULAR | Status: AC
  Administered 2012-12-31: 09:00:00 1 mg via INTRAVENOUS

## 2012-12-31 MED ORDER — IOHEXOL 300 MG/ML  SOLN
100.0000 mL | Freq: Once | INTRAMUSCULAR | Status: AC | PRN
  Administered 2012-12-31: 13:00:00 100 mL via INTRAVENOUS

## 2012-12-31 MED ORDER — IOHEXOL 300 MG/ML  SOLN: 25.0000 mL | INTRAMUSCULAR | Status: AC

## 2012-12-31 NOTE — Clinical Social Work Psychosocial (Signed)
Clinical Social Work Department BRIEF PSYCHOSOCIAL ASSESSMENT 12/31/2012  Patient:  Prewitt,Obrien A     Account Number:  [id number]     Admit date:  12/30/2012  Clinical Social Worker:  Lavell Luster  Date/Time:  12/31/2012 02:00 PM  Referred by:  Physician  Date Referred:  12/31/2012 Referred for  SNF Placement   Other Referral:   Interview type:  Patient Other interview type:    PSYCHOSOCIAL DATA Living Status:  FACILITY Admitted from facility:  [facility] Level of care:  Skilled Nursing Facility Primary support name:  Macy Lingenfelter [phone] Primary support relationship to patient:  PARENT Degree of support available:   Support is good.    CURRENT CONCERNS Current Concerns  Post-Acute Placement   Other Concerns:    SOCIAL WORK ASSESSMENT / PLAN CSW met with patient to confirm that patient would like to DC back to [facility]. Patient states that he likes [facility] and that he plans to DC back to the facility when ready. CSW will continue to follow.   Assessment/plan status:  Psychosocial Support/Ongoing Assessment of Needs Other assessment/ plan:   Complete FL2, Fax info to [employer]   Information/referral to community resources:   CSW contact information left with patient.    PATIENT'S/FAMILY'S RESPONSE TO PLAN OF CARE: Patient plans to DC back to [facility]. Patient was pleasant and appreciative of CSW visit.       Roddie Mc, [place], [place], [phone]

## 2012-12-31 NOTE — Care Management Note (Unsigned)
    Page 1 of 1   01/01/2013     3:45:00 PM   CARE MANAGEMENT NOTE 01/01/2013  Patient:  Ryan [name],Ryan [name]   Account Number:  [id number]  Date Initiated:  12/31/2012  Documentation initiated by:  Letha Cape  Subjective/Objective Assessment:   dx acute pancreatitis  admit- from [facility]     Action/Plan:   Anticipated DC Date:  01/02/2013   Anticipated DC Plan:  SKILLED NURSING FACILITY  In-house referral  Clinical Social Worker      DC Planning Services  CM consult      Choice offered to / List presented to:             Status of service:  In process, will continue to follow Medicare Important Message given?   (If response is "NO", the following Medicare IM given date fields will be blank) Date Medicare IM given:   Date Additional Medicare IM given:    Discharge Disposition:    Per UR Regulation:  Reviewed for med. necessity/level of care/duration of stay  If discussed at Long Length of Stay Meetings, dates discussed:    Comments:  01/01/13 15:44 Letha Cape RN, BSN [phone] per CSW note patient would like to return to [facility] [employer] [employer].  12/31/12 16:01 Letha Cape RN, BSN [phone] patient is from [facility], CSW referral.

## 2012-12-31 NOTE — Progress Notes (Signed)
Inpatient Diabetes Program Recommendations  AACE/ADA: New Consensus Statement on Inpatient Glycemic Control (2013)  Target Ranges:  Prepandial:   less than 140 mg/dL      Peak postprandial:   less than 180 mg/dL (1-2 hours)      Critically ill patients:  140 - 180 mg/dL   Reason for Visit: Results for SAHMIR, WEATHERBEE (MRN [id number]) as of 12/31/2012 10:59  Ref. Range 12/31/2012 00:34 12/31/2012 07:33  Glucose-Capillary Latest Range: 70-99 mg/dL 829 (H) 562 (H)  Results for NEWEL, OIEN (MRN [id number]) as of 12/31/2012 10:59  Ref. Range 12/30/2012 20:30  Hemoglobin A1C Latest Range: <5.7 % 9.6 (H)   Note history of diabetes.  A1C elevated.  Consider changing Novolog correction to q 4 hours (sensitive).  Patient was on Lantus 25 units prior to admission. Consider adding 1/2 of basal insulin if CBG's remain greater than 140 mg/dL.   Note

## 2012-12-31 NOTE — Progress Notes (Signed)
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 Chronic abd pain/ n/v with multiple scans, GES, endoscopies. CT pending. Start clears. Continue current pain regime   HPI/Subjective: C/o pain. No vomiting  Objective: Filed Vitals:   12/31/12 0807  BP: 149/83  Pulse: 99  Temp: 98.1 F (36.7 C)  Resp: 18    Intake/Output Summary (Last 24 hours) at 12/31/12 1408 Last data filed at 12/31/12 0610  Gross per 24 hour  Intake 1572.5 ml  Output      0 ml  Net 1572.5 ml   Filed Weights   12/30/12 2258  Weight: 132.269 kg (291 lb 9.6 oz)    Exam:   General:  Comfortable. talkative  Cardiovascular: RRR without MGR  Respiratory: CTA without WRR  Abdomen: S, NT, ND  Musculoskeletal: bilateral unna boots  Data Reviewed: Basic Metabolic Panel:  Recent Labs Lab 12/30/12 1810 12/31/12 0525  NA 137 133*  K 3.6 3.0*  CL 93* 93*  CO2 28 29  GLUCOSE 219* 201*  BUN 16 16  CREATININE 0.99 0.92  CALCIUM 10.2 9.9  MG  --  1.4*   Liver Function Tests:  Recent Labs Lab 12/30/12 1810 12/31/12 0525  AST 12 11  ALT 12 11  ALKPHOS 74 70  BILITOT 0.2* 0.2*  PROT 8.2 7.9  ALBUMIN 3.6 3.5    Recent Labs Lab 12/30/12 1810  LIPASE 176*   No results found for this basename: AMMONIA,  in the last 168 hours CBC:  Recent Labs Lab 12/30/12 1810 12/31/12 0525  WBC 7.7 6.6  HGB 16.0 15.0  HCT 45.8 46.1  MCV 90.5 92.6  PLT 291 271   Cardiac Enzymes: No results found for this basename: CKTOTAL, CKMB, CKMBINDEX, TROPONINI,  in the last 168 hours BNP (last 3 results)  Recent Labs  04/27/12 0951 08/15/12 0420  PROBNP 78.2 753.0*   CBG:  Recent Labs Lab  12/31/12 0034 12/31/12 0733 12/31/12 1137  GLUCAP 220* 197* 177*    Recent Results (from the past 240 hour(s))  MRSA PCR SCREENING     Status: Abnormal   Collection Time    12/30/12 11:05 PM      Result Value Range Status   MRSA by PCR POSITIVE (*) NEGATIVE Final   Comment:            The GeneXpert MRSA Assay (FDA     approved for NASAL specimens     only), is one component of a     comprehensive MRSA colonization     surveillance program. It is not     intended to diagnose MRSA     infection nor to guide or     monitor treatment for     MRSA infections.     RESULT CALLED TO, READ BACK BY AND VERIFIED WITH:     CALLED TO RN Marlaine Hind [phone] @0253  THANEY     Studies: No results found.  Scheduled Meds: . ALPRAZolam  0.5 mg Oral BID  . Chlorhexidine Gluconate Cloth  6 each Topical Q0600  . diltiazem  180 mg Oral Daily  . DULoxetine  80 mg Oral Daily  . gabapentin  300 mg Oral QHS  . insulin aspart  0-15 Units Subcutaneous TID WC  . lactulose  20 g Oral TID  . losartan  50 mg Oral Daily  . mupirocin ointment  1 application Nasal BID  . OxyCODONE  30 mg Oral Q12H  . pantoprazole  40 mg Oral Daily  . polyethylene glycol  17 g Oral Daily  . Rivaroxaban  20 mg Oral Q supper  . traZODone  100 mg Oral QHS   Continuous Infusions: . 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 If 7PM-7AM, please contact night-coverage at [email or web address], password [facility] 12/31/2012, 2:08 PM  LOS: 1 day

## 2012-12-31 NOTE — Progress Notes (Signed)
INITIAL NUTRITION ASSESSMENT  DOCUMENTATION CODES Per approved criteria  -Severe malnutrition in the context of acute illness or injury -Morbid Obesity   INTERVENTION:  Resource Breeze daily (250 kcals, 9 gm protein per 8 fl oz carton) RD to follow for nutrition care plan  NUTRITION DIAGNOSIS: Increased nutrient needs related to acute pancreatitis as evidenced by estimated nutrition needs  Goal: Pt to meet >/= 90% of their estimated nutrition needs   Monitor:  PO & supplemental intake, weight, labs, I/O's  Reason for Assessment: Malnutrition Screening Tool Report  62 y.o. male  Admitting Dx: Acute pancreatitis  ASSESSMENT: Patient presented to ER from [facility] due to persistent abdominal pain; apparently was diagnosed with ileus and mild acute on chronic pancreatitis and was treated conservatively; lipase in ER was 176.  Patient reports he is hungry at this time, however, PTA had a decreased appetite and was not eating solids for > 1 week (was consuming mostly jello and soup); he also reports weight loss; per weight readings, patient has had a 13% weight loss in < 2 months (severe for time frame); would benefit from additional kcal, protein; amenable to trying Resource Breeze clear liquid supplement ---> RD to order.  Patient meets criteria for severe malnutrition in the context of acute illness or injury as evidenced by < 50% intake of estimated energy requirement for > 5 days and 13% weight loss in < 2 months.  Height: Ht Readings from Last 1 Encounters:  12/30/12 6\' 5"  (1.956 m)    Weight: Wt Readings from Last 1 Encounters:  12/30/12 291 lb 9.6 oz (132.269 kg)    Ideal Body Weight: 208 lb  % Ideal Body Weight: 140%  Wt Readings from Last 10 Encounters:  12/30/12 291 lb 9.6 oz (132.269 kg)  11/17/12 364 lb 9.6 oz (165.381 kg)  10/22/12 333 lb (151.048 kg)  08/16/12 333 lb 5.4 oz (151.2 kg)  07/08/12 330 lb (149.687 kg)  06/01/12 312 lb 3.2 oz (141.613 kg)   05/07/12 303 lb (137.44 kg)  04/27/12 306 lb (138.801 kg)  03/28/12 320 lb (145.151 kg)  03/04/12 322 lb 1.5 oz (146.1 kg)    Usual Body Weight: 333 lb  % Usual Body Weight: 87%  BMI:  Body mass index is 34.57 kg/(m^2).  Estimated Nutritional Needs: Kcal: 2100-2300 Protein: 110-120 gm Fluid: 2.1-2.3 L  Skin: healing wounds to bilateral legs   Diet Order: NPO  EDUCATION NEEDS: -No education needs identified at this time   Intake/Output Summary (Last 24 hours) at 12/31/12 1146 Last data filed at 12/31/12 0610  Gross per 24 hour  Intake 1572.5 ml  Output      0 ml  Net 1572.5 ml    Labs:   Recent Labs Lab 12/30/12 1810 12/31/12 0525  NA 137 133*  K 3.6 3.0*  CL 93* 93*  CO2 28 29  BUN 16 16  CREATININE 0.99 0.92  CALCIUM 10.2 9.9  MG  --  1.4*  GLUCOSE 219* 201*    CBG (last 3)   Recent Labs  12/31/12 0034 12/31/12 0733  GLUCAP 220* 197*    Scheduled Meds: . ALPRAZolam  0.5 mg Oral BID  . Chlorhexidine Gluconate Cloth  6 each Topical Q0600  . diltiazem  180 mg Oral Daily  . DULoxetine  80 mg Oral Daily  . gabapentin  300 mg Oral QHS  . insulin aspart  0-15 Units Subcutaneous TID WC  . lactulose  20 g Oral TID  . losartan  50 mg Oral Daily  . mupirocin ointment  1 application Nasal BID  . OxyCODONE  30 mg Oral Q12H  . pantoprazole  40 mg Oral Daily  . polyethylene glycol  17 g Oral Daily  . potassium chloride  10 mEq Intravenous Q1 Hr x 4  . Rivaroxaban  20 mg Oral Q supper  . traZODone  100 mg Oral QHS    Continuous Infusions: . sodium chloride 75 mL/hr at 12/30/12 2232    Past Medical History  Diagnosis Date  . Hypertension   . DVT (deep venous thrombosis) ~ 2011    BLE  . 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 Hyperlipidemia   . CAD (coronary atherosclerotic disease)     s/p stents x2  . Small bowel obstruction   . Pancreatitis   . Anxiety   . GERD (gastroesophageal reflux disease)   . CHF  (congestive heart failure)   . Myocardial infarction ~ 2009  . Anginal pain   . Pneumonia ~ 2012  . Shortness of breath     "lying down & w/exertion" (03/04/2012)  . Type II diabetes mellitus   . Hep B w/o coma 1970's    "I was in [place]" (03/04/2012)  . Chronic lower back pain   . PTSD (post-traumatic stress disorder)   . Hernia   . Narcotic addiction     Past Surgical History  Procedure Laterality Date  . Esophagogastroduodenoscopy  09/22/2011    Procedure: ESOPHAGOGASTRODUODENOSCOPY (EGD);  Surgeon: Charna Elizabeth, MD;  Location: [facility] ENDOSCOPY;  Service: Endoscopy;  Laterality: N/A;  . Esophagogastroduodenoscopy  01/05/2012    Procedure: ESOPHAGOGASTRODUODENOSCOPY (EGD);  Surgeon: Rachael Fee, MD;  Location: [facility] ENDOSCOPY;  Service: Endoscopy;  Laterality: N/A;  . Cholecystectomy  ~ 2009  . Spinal growth rods  ~ 2011  . Tonsillectomy and adenoidectomy    . Coronary angioplasty with stent placement  ~ 2009`    "1 + ! (after 1st one failed)" (03/04/2012)    Maureen Chatters, RD, LDN Pager #: [phone] After-Hours Pager #: [phone]

## 2012-12-31 NOTE — Progress Notes (Signed)
Utilization review completed. Jaelene Garciagarcia, RN, BSN. 

## 2012-12-31 NOTE — Progress Notes (Signed)
Pt admitted to unit from ED. Pt is A&O & VS stable. Two healing wounds on BLE-followed by WOC RN & pt states RN changes dsgs every Tuesady. Pt is currently resting comfortably in bed. Will continue to monitor.

## 2013-01-01 ENCOUNTER — Encounter (HOSPITAL_COMMUNITY): Payer: Self-pay | Admitting: Physician Assistant

## 2013-01-01 ENCOUNTER — Other Ambulatory Visit: Payer: Self-pay | Admitting: Physician Assistant

## 2013-01-01 ENCOUNTER — Ambulatory Visit: Payer: Medicare Other | Admitting: Internal Medicine

## 2013-01-01 DIAGNOSIS — E43 Unspecified severe protein-calorie malnutrition: Secondary | ICD-10-CM | POA: Insufficient documentation

## 2013-01-01 LAB — BASIC METABOLIC PANEL
BUN: 17 mg/dL (ref 6–23)
CO2: 25 mEq/L (ref 19–32)
Calcium: 9.6 mg/dL (ref 8.4–10.5)
GFR calc non Af Amer: >90 mL/min (ref >90)
Glucose, Bld: 378 mg/dL — ABNORMAL HIGH (ref 70–99)

## 2013-01-01 LAB — GLUCOSE, CAPILLARY
Glucose-Capillary: 356 mg/dL — ABNORMAL HIGH (ref 70–99)
Glucose-Capillary: 294 mg/dL — ABNORMAL HIGH (ref 70–99)
Glucose-Capillary: 252 mg/dL — ABNORMAL HIGH (ref 70–99)
Glucose-Capillary: 297 mg/dL — ABNORMAL HIGH (ref 70–99)

## 2013-01-01 MED ORDER — INSULIN GLARGINE 100 UNIT/ML ~~LOC~~ SOLN
12.0000 [IU] | Freq: Every day | SUBCUTANEOUS | Status: DC
  Administered 2013-01-01: 12 [IU] via SUBCUTANEOUS
  Filled 2013-01-01: qty 0.12

## 2013-01-01 MED ORDER — POTASSIUM CHLORIDE 10 MEQ/100ML IV SOLN
10.0000 meq | INTRAVENOUS | Status: AC
  Administered 2013-01-01 (×3): 10 meq via INTRAVENOUS
  Filled 2013-01-01 (×3): qty 100

## 2013-01-01 MED ORDER — HYDROMORPHONE HCL PF 1 MG/ML IJ SOLN
3.0000 mg | INTRAMUSCULAR | Status: DC | PRN
  Administered 2013-01-01 – 2013-01-03 (×13): 3 mg via INTRAVENOUS
  Filled 2013-01-01 (×13): qty 3

## 2013-01-01 MED ORDER — INSULIN ASPART 100 UNIT/ML ~~LOC~~ SOLN
0.0000 [IU] | Freq: Three times a day (TID) | SUBCUTANEOUS | Status: DC
  Administered 2013-01-01 – 2013-01-03 (×7): 11 [IU] via SUBCUTANEOUS
  Administered 2013-01-03 – 2013-01-04 (×3): 4 [IU] via SUBCUTANEOUS

## 2013-01-01 MED ORDER — BISACODYL 10 MG RE SUPP
10.0000 mg | Freq: Once | RECTAL | Status: AC
  Administered 2013-01-01: 10 mg via RECTAL
  Filled 2013-01-01: qty 1

## 2013-01-01 NOTE — Progress Notes (Signed)
01/01/2013 10:15 AM  I offered to get patient OOB for breakfast this am around 0830.  Pt pleasantly declined.  Will attempt to get pt OOB later today. Theadora Rama

## 2013-01-01 NOTE — Progress Notes (Signed)
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 - Will attempt to advance diet to low fat solids.  If he eats we will discontinue IV pain medications. - GI Consultation for recommendations - MRCP?    Diabetes mellitus  - not well controlled. - Lantus insulin - increase to 12 units tonight (9/5) - insulin sliding scale - increase to resistant on 9/5  Chronic pain/narcotic dependence  -Restart home long-acting narcotics.   Atrial flutter  -Rate is controlled, check EKG.  -Patient is on Xarelto for anticoagulation.   HPI/Subjective: Still complaining of pain, requesting more pain medication.  Reports poor BMs.(1 very small one)  Objective: Filed Vitals:   01/01/13 0520  BP: 121/68  Pulse: 78  Temp: 98.6 F (37 C)  Resp: 20    Intake/Output Summary (Last 24 hours) at 01/01/13 1059 Last data filed at 01/01/13 0911  Gross per 24 hour  Intake 2398.75 ml  Output   1400 ml  Net 998.75 ml   Filed Weights   12/30/12 2258 12/31/12 2128  Weight: 132.269 kg (291 lb 9.6 oz) 130.591 kg (287 lb 14.4 oz)    Exam:   General:  Sitting on side of bed, appears in mild distress, requesting more frequent pain medication.  Cardiovascular: RRR without MGR  Respiratory: CTA without WRR  Abdomen: soft, nt, nd, quiet bowel sounds.  Musculoskeletal: bilateral wrapped lower extremities.  Data Reviewed: Basic Metabolic Panel:  Recent Labs Lab 12/30/12 1810 12/31/12 0525 01/01/13 0536  NA 137 133*  129*  K 3.6 3.0* 3.5  CL 93* 93* 91*  CO2 28 29 25   GLUCOSE 219* 201* 378*  BUN 16 16 17   CREATININE 0.99 0.92 0.86  CALCIUM 10.2 9.9 9.6  MG  --  1.4*  --    Liver Function Tests:  Recent Labs Lab 12/30/12 1810 12/31/12 0525  AST 12 11  ALT 12 11  ALKPHOS 74 70  BILITOT 0.2* 0.2*  PROT 8.2 7.9  ALBUMIN 3.6 3.5    Recent Labs Lab 12/30/12 1810 01/01/13 0536  LIPASE 176* 256*   CBC:  Recent Labs Lab 12/30/12 1810 12/31/12 0525  WBC 7.7 6.6  HGB 16.0 15.0  HCT 45.8 46.1  MCV 90.5 92.6  PLT 291 271   BNP (last 3 results)  Recent Labs  04/27/12 0951 08/15/12 0420  PROBNP 78.2 753.0*   CBG:  Recent Labs Lab 12/31/12 0034 12/31/12 0733 12/31/12 1137 12/31/12 1632 01/01/13 0806  GLUCAP 220* 197* 177* 261* 356*    Recent Results (from the past 240 hour(s))  MRSA PCR SCREENING     Status: Abnormal   Collection Time    12/30/12 11:05 PM      Result Value Range Status   MRSA by PCR POSITIVE (*) NEGATIVE Final   Comment:            The GeneXpert MRSA Assay (FDA     approved for NASAL specimens     only), is one component of a     comprehensive MRSA colonization  surveillance program. It is not     intended to diagnose MRSA     infection nor to guide or     monitor treatment for     MRSA infections.     RESULT CALLED TO, READ BACK BY AND VERIFIED WITH:     CALLED TO RN Marlaine Hind [phone] @0253  THANEY     Studies: Ct Abdomen Pelvis W Contrast  12/31/2012   *RADIOLOGY REPORT*  Clinical Data: Pancreatitis  CT ABDOMEN AND PELVIS WITH CONTRAST  Technique:  Multidetector CT imaging of the abdomen and pelvis was performed following the standard protocol during bolus administration of intravenous contrast.  Contrast: OMNIPAQUE IOHEXOL 300 MG/ML  SOLN  Comparison: 07/25/2012  Findings: Patchy ground-glass opacities at the lung bases are stable.  Post cholecystectomy.  Diffuse hepatic steatosis.  There are inflammatory changes involving the  entire pancreatic parenchyma.  This is characterized by stranding in the adjacent fat and blurring of fascial planes within the pancreas.  No evidence of pancreatic necrosis, hemorrhage, or pseudocyst.  No acute fluid collection.  Small inflammatory nodes are seen about the pancreatic head and body.  Portal vein is patent.  Superior mesenteric vein is patent. Splenic vein is patent.  Spleen, and right kidney are within normal limits.  Stable low density lesion in the lower pole of the left kidney. Stable low density lesion in the right adrenal gland.  Innumerable para-aortic lymph nodes are again noted.  They have become slightly more prominent but remain less than 10 mm in short axis diameter.  No free fluid.  Bladder and prostate are within normal limits.  Normal appendix.  Stable spinal hardware at L4 and L5-4 fusion.  IMPRESSION: Findings are consistent with acute pancreatitis.  No evidence of necrosis, pancreatic hemorrhage, or pseudocyst formation.   Original Report Authenticated By: Jolaine Click, M.D.    Scheduled Meds: . ALPRAZolam  0.5 mg Oral BID  . Chlorhexidine Gluconate Cloth  6 each Topical Q0600  . diltiazem  180 mg Oral Daily  . DULoxetine  80 mg Oral Daily  . feeding supplement  1 Container Oral Q supper  . gabapentin  300 mg Oral QHS  . insulin aspart  0-15 Units Subcutaneous TID WC  . insulin glargine  10 Units Subcutaneous QHS  . lactulose  20 g Oral TID  . losartan  50 mg Oral Daily  . mupirocin ointment  1 application Nasal BID  . OxyCODONE  30 mg Oral Q12H  . pantoprazole  40 mg Oral Daily  . polyethylene glycol  17 g Oral Daily  . Rivaroxaban  20 mg Oral Q supper  . 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 If 7PM-7AM, please contact night-coverage at [email or web address], password [facility] 01/01/2013, 10:59 AM  LOS: 2 days   Attending note:  Patient interviewed and examined.  Agree with above.  Ryan [name], M.D.

## 2013-01-01 NOTE — Consult Note (Signed)
Gastroenterology Consult: 12:03 PM 01/01/2013   Referring Provider: Crista Curb MD Primary Care Physician:  Warrick Parisian, MD Primary Gastroenterologist:  Dr. Yancey Flemings  Reason for Consultation:  Acute pancreatitis.   HPI: Ryan [name] is a 62 y.o. male.  Obese man with IDDM (A1c 2 weeks ago was 12.3).  Habitual use of narcotics.  Required multiple admissions in [place] for the GI sxs that were diagnosed as pancreatitis that ultimately led to Cholecystectomy around 2009.  Episodic abdominal pain has become  Less frequent since cholecystectomy. However,  Lipase reached to 86 in 03/2012 and 155 in 12/2011 but is often normal.  Specifically it was 15 in 07/2011 at which time LFTs (generally normal) revealed elevated alk phos to 196 and AST to 150.   Had multiple encoscopies in [place] and [place] and 2 here in the last year.  Has NSAID induced ulcer in 08/2011 and slightly abnormal ampulla in 12/2011.   Takes chronic narcotics which MDs have suggested/attempted to wean but he still uses these for mgt of back and abdominal pain.  Previous imaging includes:  CT scans # 19 from 2009 thru 12/2012.  In 11/2011 had "mild main pancreatic and central biliary ductal prominence  to the level of soft tissue fullness at the ampulla." On 12/31/12 scan has acute pancreatitis has diffuse pancreatitis.   Nuclear gastric emptying study 09/2011:  Normal study  Pt has not had MRCP or EUS in [place].   Last GI encounter was OV with Dr Marina Goodell in 06/2012.  His assessment was that of chronic functional abdominal  Pain, chronic constipation, Hx NSAID gastric ulcer: resolved.  He recommended continued lactulose, chronic PPI, minimizing narcotics.   Admitted 9/3 with 2 weeks of progressive, severe mid abdominal pain.  Did not pass in a few days, the usual pattern.  His appetite is poor, he has lost weight.  He denies nausea or vomiting.  Pain does not radiate. It  was not relieved by oral narcotics, somewhat relieved by Dilaudid and by laying on his belly.  Tolerating clears, diet for lunch is to be carb modified.  The lipase has gone from 176 to 256.  LFTs are normal. CT is showing pancreatitis.   Pt says he was not having abdominal pain before this current episode.    Past Medical History  Diagnosis Date  . Hypertension   . DVT (deep venous thrombosis) ~ 2011    BLE  . 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 Hyperlipidemia   . CAD (coronary atherosclerotic disease)     s/p stents x2  . Small bowel obstruction   . Pancreatitis   . Anxiety   . GERD (gastroesophageal reflux disease)   . CHF (congestive heart failure)   . Myocardial infarction ~ 2009  . Anginal pain   . Pneumonia ~ 2012  . Shortness of breath     "lying down & w/exertion" (03/04/2012)  . Type II diabetes mellitus   . Hep B w/o coma 1970's    "I was in [place]" (03/04/2012)  . Chronic lower back pain   . PTSD (post-traumatic stress disorder)   . Hernia   . Narcotic addiction   . DDD (degenerative disc disease)     multilevel     Past Surgical History  Procedure Laterality Date  . Esophagogastroduodenoscopy  09/22/2011    Procedure: ESOPHAGOGASTRODUODENOSCOPY (EGD);  Surgeon: Charna Elizabeth, MD;  Location: [facility] ENDOSCOPY;  Service: Endoscopy;  Laterality: N/A;  .  Esophagogastroduodenoscopy  01/05/2012    Procedure: ESOPHAGOGASTRODUODENOSCOPY (EGD);  Surgeon: Rachael Fee, MD;  Location: [facility] ENDOSCOPY;  Service: Endoscopy;  Laterality: N/A;  . Cholecystectomy  ~ 2009  . Spinal growth rods ????  ~ 2011  . Tonsillectomy and adenoidectomy    . Coronary angioplasty with stent placement  ~ 2009`    "1 + ! (after 1st one failed)" (03/04/2012)    Prior to Admission medications   Medication Sig Start Date End Date Taking? Authorizing Provider  ALPRAZolam Prudy Feeler) 0.5 MG tablet Take 1 tablet (0.5 mg total) by mouth 2 (two) times daily. 11/17/12  Yes Kimber Relic, MD  Cholecalciferol (VITAMIN D) 2000 UNITS CAPS Take 2,000 Units by mouth daily.   Yes Historical Provider, MD  diltiazem (CARDIZEM CD) 180 MG 24 hr capsule Take 180 mg by mouth daily.   Yes Historical Provider, MD  DULoxetine (CYMBALTA) 20 MG capsule Take 20 mg by mouth daily.   Yes Historical Provider, MD  DULoxetine (CYMBALTA) 60 MG capsule Take 60 mg by mouth daily.   Yes Historical Provider, MD  Emollient (HYDROPHOR) OINT Apply 1 application topically 2 (two) times daily as needed (apply bilaterally from knees to feet for dry skin).   Yes Historical Provider, MD  gabapentin (NEURONTIN) 300 MG capsule Take 300 mg by mouth at bedtime.   Yes Historical Provider, MD  hydroxypropyl methylcellulose (ISOPTO TEARS) 2.5 % ophthalmic solution Place 1 drop into the right eye 3 (three) times daily.   Yes Historical Provider, MD  insulin glargine (LANTUS) 100 UNIT/ML injection Inject 25 Units into the skin at bedtime.    Yes Historical Provider, MD  insulin glulisine (APIDRA) 100 UNIT/ML injection Inject 5 Units into the skin 2 (two) times daily. Prior to lunch and supper   Yes Historical Provider, MD  lactulose (CHRONULAC) 10 GM/15ML solution Take 30 mLs by mouth 3 (three) times daily.    Yes Historical Provider, MD  losartan (COZAAR) 50 MG tablet Take 50 mg by mouth daily. Hold for sbp<110, repeat blood pressure in 1 hour   Yes Historical Provider, MD  Melatonin 3 MG TABS Take 3 mg by mouth at bedtime.   Yes Historical Provider, MD  metFORMIN (GLUCOPHAGE) 850 MG tablet Take 850 mg by mouth 3 (three) times daily with meals.   Yes Historical Provider, MD  ondansetron (ZOFRAN-ODT) 8 MG disintegrating tablet Take 8 mg by mouth every 8 (eight) hours as needed for nausea.   Yes Historical Provider, MD  oxyCODONE (OXY IR/ROXICODONE) 5 MG immediate release tablet Take 15 mg by mouth every 6 (six) hours as needed for pain.   Yes Historical Provider, MD  OxyCODONE HCl ER (OXYCONTIN) 30 MG T12A Take 30 mg by  mouth every 12 (twelve) hours.   Yes Historical Provider, MD  pantoprazole (PROTONIX) 40 MG tablet Take 40 mg by mouth daily.   Yes Historical Provider, MD  polyethylene glycol (MIRALAX / GLYCOLAX) packet Take 17 g by mouth daily.   Yes Historical Provider, MD  Rivaroxaban (XARELTO) 20 MG TABS tablet Take 20 mg by mouth daily with supper.   Yes Historical Provider, MD  senna-docusate (SENOKOT-S) 8.6-50 MG per tablet Take 1 tablet by mouth 2 (two) times daily. Hold for diarrhea   Yes Historical Provider, MD  torsemide (DEMADEX) 20 MG tablet Take 60 mg by mouth daily.   Yes Historical Provider, MD  traZODone (DESYREL) 100 MG tablet Take 100 mg by mouth at bedtime.   Yes Historical Provider, MD  Scheduled Meds: . ALPRAZolam  0.5 mg Oral BID  . Chlorhexidine Gluconate Cloth  6 each Topical Q0600  . diltiazem  180 mg Oral Daily  . DULoxetine  80 mg Oral Daily  . feeding supplement  1 Container Oral Q supper  . gabapentin  300 mg Oral QHS  . insulin aspart  0-20 Units Subcutaneous TID WC  . insulin glargine  12 Units Subcutaneous QHS  . lactulose  20 g Oral TID  . losartan  50 mg Oral Daily  . mupirocin ointment  1 application Nasal BID  . OxyCODONE  30 mg Oral Q12H  . pantoprazole  40 mg Oral Daily  . polyethylene glycol  17 g Oral Daily  . potassium chloride  10 mEq Intravenous Q1 Hr x 3  . Rivaroxaban  20 mg Oral Q supper  . traZODone  100 mg Oral QHS   Infusions:   PRN Meds: acetaminophen, acetaminophen, HYDROmorphone (DILAUDID) injection, ondansetron (ZOFRAN) IV, ondansetron, ondansetron   Allergies as of 12/30/2012 - Review Complete 12/30/2012  Allergen Reaction Noted  . Lisinopril Swelling and Rash 02/06/2011  . Morphine and related Swelling and Rash 02/06/2011    Family History  Problem Relation Age of Onset  . Diabetes Father   . Colon cancer Neg Hx     History   Social History  . Marital Status: Legally Separated    Spouse Name: N/A    Number of Children: 1   . Years of Education: N/A   Occupational History  . Disabled    Social History Main Topics  . Smoking status: Former Smoker -- 0.50 packs/day for 38 years    Types: Cigarettes    Quit date: 06/24/2012  . Smokeless tobacco: Never Used     Comment: just recently quit 3-4 months ago  . 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 Drug Use: Yes    Special: Marijuana     Comment: 03/04/2012 "last marijuana ~ 15-20 yr ago"  . Sexual Activity: Not Currently   Social History Narrative  . Resident at [facility]    REVIEW OF SYSTEMS: Constitutional:  Weight 05/2012 was 312#, now 287.  ENT:  No nose bleeds Pulm:  No cough, no dyspnea CV:  Has had several weeks of blistering, weeping LE edema.  Skin care wrapping these weekly GU:  No oliguria, no hematuria, no frequency. GI:  Per HPI.  No dysphagia Heme:  No need for oral iron or B12 in past.    Transfusions:  none Neuro:  No falls, no headaches, + burning pedal neuropathy.  Derm:  Legs wrapped.  Not painful Endocrine:  No excessive thirst, no sweats.  Sugars at SNF range 70s to 250s Immunization:  Not investigated Travel:  none   PHYSICAL EXAM: Vital signs in last 24 hours: Temp:  [97.7 F (36.5 C)-98.6 F (37 C)] 98.6 F (37 C) (09/05 0520) Pulse Rate:  [78-98] 78 (09/05 0520) Resp:  [18-20] 20 (09/05 0520) BP: (112-137)/(63-87) 121/68 mmHg (09/05 0520) SpO2:  [93 %-98 %] 93 % (09/05 0520) Weight:  [130.591 kg (287 lb 14.4 oz)] 130.591 kg (287 lb 14.4 oz) (09/04 2128) BMI:  34   General: pleasant, tall, obese Ryan [name].  comfortable Head:  No asymmetry or trauma  Eyes:  No icterus or pallor Ears:  Not HOH  Nose:  No discharge or congestion  Mouth:  Moist, clear oral MM.  Many missing teeth.  Swollen lower lip Neck:  No mass, no JVD Lungs:  Clear bil.  No dyspnea or cough Heart: RRR.  No MRG Abdomen:  Obese, mid abdominal  tenderness, no guard or rebound.  No mass no bruits.  No HSM.   Rectal: not done   Musc/Skeltl: no joint contractures.  Flat feet.  Extremities:  +  LE edema  Neurologic:  No asterixis, no tremor.  Oriented x3.   Skin:  LE wraps from knee to ankle Tattoos:  none Nodes:  No cervical adenopathy.    Psych:  Pleasant, not agitated or depressed.   Intake/Output from previous day: 09/04 0701 - 09/05 0700 In: 1558.8 [P.O.:600; I.V.:908.8; IV Piggyback:50] Out: 1100 [Urine:1100] Intake/Output this shift: Total I/O In: 840 [P.O.:840] Out: 300 [Urine:300]  LAB RESULTS:  Recent Labs  12/30/12 1810 12/31/12 0525  WBC 7.7 6.6  HGB 16.0 15.0  HCT 45.8 46.1  PLT 291 271   BMET Lab Results  Component Value Date   NA 129* 01/01/2013   NA 133* 12/31/2012   NA 137 12/30/2012   K 3.5 01/01/2013   K 3.0* 12/31/2012   K 3.6 12/30/2012   CL 91* 01/01/2013   CL 93* 12/31/2012   CL 93* 12/30/2012   CO2 25 01/01/2013   CO2 29 12/31/2012   CO2 28 12/30/2012   GLUCOSE 378* 01/01/2013   GLUCOSE 201* 12/31/2012   GLUCOSE 219* 12/30/2012   BUN 17 01/01/2013   BUN 16 12/31/2012   BUN 16 12/30/2012   CREATININE 0.86 01/01/2013   CREATININE 0.92 12/31/2012   CREATININE 0.99 12/30/2012   CALCIUM 9.6 01/01/2013   CALCIUM 9.9 12/31/2012   CALCIUM 10.2 12/30/2012   LFT  Recent Labs  12/30/12 1810 12/31/12 0525  PROT 8.2 7.9  ALBUMIN 3.6 3.5  AST 12 11  ALT 12 11  ALKPHOS 74 70  BILITOT 0.2* 0.2*  Lipase                         176                  256 01/01/13   PT/INR Lab Results  Component Value Date   INR 0.95 11/02/2011   INR 0.97 09/22/2011   INR 1.39 11/15/2010    Drugs of Abuse     Component Value Date/Time   LABOPIA NONE DETECTED 08/14/2012 1109   LABOPIA NEGATIVE 11/13/2010 2022   COCAINSCRNUR NONE DETECTED 08/14/2012 1109   COCAINSCRNUR NEGATIVE 11/13/2010 2022   LABBENZ POSITIVE* 08/14/2012 1109   LABBENZ POSITIVE* 11/13/2010 2022   AMPHETMU NONE DETECTED 08/14/2012 1109   AMPHETMU NEGATIVE 11/13/2010 2022   THCU  NONE DETECTED 08/14/2012 1109   LABBARB NONE DETECTED 08/14/2012 1109    MRSA on nasal swab.   RADIOLOGY STUDIES: Ct Abdomen Pelvis W Contrast 12/31/2012   *RADIOLOGY REPORT*  Clinical Data: Pancreatitis  CT ABDOMEN AND PELVIS WITH CONTRAST  Technique:  Multidetector CT imaging of the abdomen and pelvis was performed following the standard protocol during bolus administration of intravenous contrast.  Contrast: OMNIPAQUE IOHEXOL 300 MG/ML  SOLN  Comparison: 07/25/2012  Findings: Patchy ground-glass opacities at the lung bases are stable.  Post cholecystectomy.  Diffuse hepatic steatosis.  There are inflammatory changes involving the entire pancreatic parenchyma.  This is characterized by stranding in the adjacent fat and blurring of fascial planes within the pancreas.  No evidence of  pancreatic necrosis, hemorrhage, or pseudocyst.  No acute fluid collection.  Small inflammatory nodes are seen about the pancreatic head and body.  Portal vein is patent.  Superior mesenteric vein is patent. Splenic vein is patent.  Spleen, and right kidney are within normal limits.  Stable low density lesion in the lower pole of the left kidney. Stable low density lesion in the right adrenal gland.  Innumerable para-aortic lymph nodes are again noted.  They have become slightly more prominent but remain less than 10 mm in short axis diameter.  No free fluid.  Bladder and prostate are within normal limits.  Normal appendix.  Stable spinal hardware at L4 and L5-4 fusion.  IMPRESSION: Findings are consistent with acute pancreatitis.  No evidence of necrosis, pancreatic hemorrhage, or pseudocyst formation.   Original Report Authenticated By: Jolaine Click, M.D.    ENDOSCOPIC STUDIES: EGD 08/2011 Dr Loreta Ave (covering for [facility]) for abd pain , n/v  IMPRESSION: 1) Erosive esophagitis-distal esophagus.  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  3) Prominent antral fold-biopsied.  4) Polypoid lesion on ?ampulla-not biopsied.   RECOMMENDATIONS: 1) Anti-reflux regimen to be followed.  2) Avoid NSAIDS for now.  3) Await pathology results.  4) Continue PPI's.  5) Clear liquid diet.  6) ?EUS at a later date.  Pathology  1. Stomach, biopsy, proximal and GE junction ulcer  - GASTROESOPHAGEAL JUNCTION MUCOSA WITH ASSOCIATED ULCERATION.  - NO EVIDENCE OF INTESTINAL METAPLASIA, DYSPLASIA OR MALIGNANCY.  2. Stomach, biopsy, prominent antral folds  - REACTIVE GASTROPATHY WITH FOCAL INTESTINAL METAPLASIA.   EGD 12/2011 Dr Christella Hartigan for abd pain, n/v  ENDOSCOPIC IMPRESSION:  Slightly abnormal major papilla, doubt this is pathologic and very  unlikely to be contributing to his GI symptoms. Biopsies taken.  Otherwise normal examination  RECOMMENDATIONS:  Treat clinically with anitemetics.  Try to wean off such high doses of chronic narcotic meds (has been taking 60 oxycontin twice daily and also 30 oxycodone 6 times a day). These are very likely causing or at least contributing to your chronic GI symptoms.  Await final biopsies, I will commmunicate these with Dr. Marina Goodell.  Pathology  Duodenal Ampulla/Bulb  - BENIGN REACTIVE/REGENERATIVE DUODENAL MUCOSA WITH VILLOUS DISTORTION GASTRIC  FOVEOLAR METAPLASIA AND MINIMAL CHRONIC INFLAMMATION, SEE COMMENT.  - NEGATIVE FOR DYSPLASIA AND MALIGNANCY.  - NO GIARDIA IDENTIFIED.   IMPRESSION: *  Recurrent acute abdominal pain. Now with biochemical and CT confirmation of acute pancreatitis.  Had slightly abnormal ampulla on EGD in 12/2011.  Lipase yesterday was 256.  ? Cause of the pancreatitis.   *  S/p cholecystectomy ~ 2009. *  Chronic pain, chronic narcotics dependence for spine disease.  and neuropathic foot pain. Has not managed to wean off these. Receiving prn dilaudid in addition to his chronic oxycodone.  *  Fatty liver. Unconfirmed hx of hepatitis B treated by health dept with INH but this corresponds with treatment for apparent + TB test (pt actually denies this). *   Hyponatremia.  *  IDDM.  Not well controlled.   *  Hx right heart failure, cor pulmonale.  *  Morbid obesity.  Has dropped about 25 # in last 7 months.  *  Xarelto for hx 2011 bil DVT. *  Hx 08/2011 GEJx ulcer, healed on follow up EGD 12/2011. On daily PPI.    PLAN: *  Supportive care.  Would not push the diet if he does not tolerate solids. Continue the NS, if po intake is poor, will need  to increase the rate up from current 75/hour.  *  Consider MRCP and/or EUS/ERCP after this acute issue has resolved.  *  Lipase in AM.  Also ordering  Hepatitis serologies for B and C.    LOS: 2 days   Jennye Moccasin  01/01/2013, 12:03 PM Pager: [phone]  [facility] Attending  I have also seen and assessed the patient and agree with the above note. He has acute pancreatitis - diffusely abnormal pancreas on CT and sig lipase elevation thopugh now normal. He also takes narcotics chronically so is more difficult to treat (pain prom pancreatitis). He will take longer to resolve than those w/o similar problems. i am not sure why he has pancreatitis but has had work-ups. Conservative rx - back to clears as solid food not going well today I increased hydromorphone to 3 mg q3 May need to increase longer acting narcotics for a while Will see again tomorrow  Iva Boop, MD, [facility] [phone] (pager) 01/01/2013 6:17 PM

## 2013-01-02 ENCOUNTER — Encounter (HOSPITAL_COMMUNITY): Payer: Self-pay | Admitting: Internal Medicine

## 2013-01-02 DIAGNOSIS — F1920 Other psychoactive substance dependence, uncomplicated: Secondary | ICD-10-CM

## 2013-01-02 DIAGNOSIS — B1920 Unspecified viral hepatitis C without hepatic coma: Secondary | ICD-10-CM

## 2013-01-02 DIAGNOSIS — E1165 Type 2 diabetes mellitus with hyperglycemia: Secondary | ICD-10-CM

## 2013-01-02 HISTORY — DX: Unspecified viral hepatitis C without hepatic coma: B19.20

## 2013-01-02 LAB — BASIC METABOLIC PANEL
Calcium: 10.2 mg/dL (ref 8.4–10.5)
GFR calc Af Amer: >90 mL/min (ref >90)
GFR calc non Af Amer: >90 mL/min (ref >90)
Glucose, Bld: 300 mg/dL — ABNORMAL HIGH (ref 70–99)
Potassium: 4.1 mEq/L (ref 3.5–5.1)
Sodium: 132 mEq/L — ABNORMAL LOW (ref 135–145)

## 2013-01-02 LAB — GLUCOSE, CAPILLARY
Glucose-Capillary: 254 mg/dL — ABNORMAL HIGH (ref 70–99)
Glucose-Capillary: 296 mg/dL — ABNORMAL HIGH (ref 70–99)

## 2013-01-02 LAB — LIPASE, BLOOD: Lipase: 247 U/L — ABNORMAL HIGH (ref 11–59)

## 2013-01-02 MED ORDER — INSULIN ASPART 100 UNIT/ML ~~LOC~~ SOLN
6.0000 [IU] | Freq: Three times a day (TID) | SUBCUTANEOUS | Status: DC
  Administered 2013-01-02 – 2013-01-03 (×2): 6 [IU] via SUBCUTANEOUS

## 2013-01-02 MED ORDER — INSULIN GLARGINE 100 UNIT/ML ~~LOC~~ SOLN
20.0000 [IU] | Freq: Every day | SUBCUTANEOUS | Status: DC
  Administered 2013-01-02: 20 [IU] via SUBCUTANEOUS
  Filled 2013-01-02 (×2): qty 0.2

## 2013-01-02 NOTE — Progress Notes (Addendum)
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 Tolerating diet  Diabetes mellitus  Increase lantus and add novolog meal coverage  Chronic pain/narcotic dependence  -Restart home long-acting narcotics.   Atrial flutter  xarelto   HPI/Subjective: Eating ok. Still with pain. Better on current meds  Objective: Filed Vitals:   01/02/13 1528  BP: 111/77  Pulse: 81  Temp: 98.2 F (36.8 C)  Resp: 18    Intake/Output Summary (Last 24 hours) at 01/02/13 2010 Last data filed at 01/02/13 1923  Gross per 24 hour  Intake    840 ml  Output   2580 ml  Net  -1740 ml   Filed Weights   12/30/12 2258 12/31/12 2128  Weight: 132.269 kg (291 lb 9.6 oz) 130.591 kg (287 lb 14.4 oz)    Exam:   General:  Sitting on side of bed, comfortable and talkative  Cardiovascular: RRR without MGR  Respiratory: CTA without WRR  Abdomen: soft, nt, nd, quiet bowel sounds.  Musculoskeletal: bilateral wrapped lower extremities.  Data Reviewed: Basic Metabolic Panel:  Recent Labs Lab 12/30/12 1810 12/31/12 0525 01/01/13 0536 01/02/13 0501  NA 137 133* 129* 132*  K 3.6 3.0* 3.5 4.1  CL 93* 93* 91* 95*  CO2 28 29 25 25   GLUCOSE 219* 201* 378* 300*  BUN 16 16 17 14   CREATININE 0.99 0.92 0.86 0.72  CALCIUM 10.2 9.9 9.6 10.2  MG  --  1.4*  --   --    Liver Function Tests:  Recent Labs Lab 12/30/12 1810 12/31/12 0525  AST 12 11  ALT 12 11  ALKPHOS 74 70  BILITOT 0.2* 0.2*  PROT 8.2 7.9  ALBUMIN 3.6 3.5    Recent  Labs Lab 12/30/12 1810 01/01/13 0536 01/02/13 0501  LIPASE 176* 256* 247*   CBC:  Recent Labs Lab 12/30/12 1810 12/31/12 0525  WBC 7.7 6.6  HGB 16.0 15.0  HCT 45.8 46.1  MCV 90.5 92.6  PLT 291 271   BNP (last 3 results)  Recent Labs  04/27/12 0951 08/15/12 0420  PROBNP 78.2 753.0*   CBG:  Recent Labs Lab 01/01/13 1639 01/01/13 2142 01/02/13 0801 01/02/13 1133 01/02/13 1715  GLUCAP 252* 297* 269* 275* 254*    Recent Results (from the past 240 hour(s))  MRSA PCR SCREENING     Status: Abnormal   Collection Time    12/30/12 11:05 PM      Result Value Range Status   MRSA by PCR POSITIVE (*) NEGATIVE Final   Comment:            The GeneXpert MRSA Assay (FDA     approved for NASAL specimens     only), is one component of a     comprehensive MRSA colonization     surveillance program. It is not     intended to diagnose MRSA     infection nor to guide or     monitor treatment for     MRSA infections.     RESULT CALLED TO, READ BACK BY AND VERIFIED WITH:  CALLED TO RN Marlaine Hind [id number] @0253  THANEY     Studies: No results found.  Scheduled Meds: . ALPRAZolam  0.5 mg Oral BID  . Chlorhexidine Gluconate Cloth  6 each Topical Q0600  . diltiazem  180 mg Oral Daily  . DULoxetine  80 mg Oral Daily  . feeding supplement  1 Container Oral Q supper  . gabapentin  300 mg Oral QHS  . insulin aspart  0-20 Units Subcutaneous TID WC  . insulin aspart  6 Units Subcutaneous TID WC  . insulin glargine  20 Units Subcutaneous QHS  . lactulose  20 g Oral TID  . losartan  50 mg Oral Daily  . mupirocin ointment  1 application Nasal BID  . OxyCODONE  30 mg Oral Q12H  . pantoprazole  40 mg Oral Daily  . polyethylene glycol  17 g Oral Daily  . Rivaroxaban  20 mg Oral Q supper  . traZODone  100 mg Oral QHS   Continuous Infusions:    Christiane Ha, MD  [facility] Pager [phone]. If 7PM-7AM, please contact night-coverage at [email or web address], password  [facility] 01/02/2013, 8:10 PM  LOS: 3 days

## 2013-01-02 NOTE — ED Provider Notes (Signed)
I saw and evaluated the patient, reviewed the resident's note and I agree with the findings and plan. Patient with abdominal pain. History same. Lipase is elevated. Continued tenderness.  Juliet Rude. Rubin Payor, MD 01/02/13 [phone]

## 2013-01-02 NOTE — Progress Notes (Signed)
[facility] Progress Note  Subjective:  Says that he still has pain, but increase in pain medication helps a lot better.  Diet was never changed so he got eggs, bacon, sausage, and a muffin but says that he only ate a little bit of the muffin and nothing else.  Objective:  Vital signs in last 24 hours: Temp:  [97.3 F (36.3 C)-98 F (36.7 C)] 97.3 F (36.3 C) (09/06 0538) Pulse Rate:  [83-98] 83 (09/06 0538) Resp:  [18-20] 20 (09/06 0538) BP: (123-137)/(76-80) 137/76 mmHg (09/06 0538) SpO2:  [95 %-99 %] 97 % (09/06 0538) Last BM Date: 01/01/13 General:   Alert, Well-developed, in NAD; appears slightly uncomfortable. Heart:  Regular rate and rhythm; no murmurs Pulm:  CTAB.  No W/R/R. Abdomen:  Soft, non-distended.  Obese.  BS present but quiet.  Diffuse TTP > in the upper abdomen but no R/R/G. Extremities:  Legs wrapped with bandages. Neurologic:  Alert and  oriented x4;  grossly normal neurologically. Psych:  Alert and cooperative. Normal mood and affect.  Intake/Output from previous day: 09/05 0701 - 09/06 0700 In: 1640 [P.O.:1440; IV Piggyback:200] Out: 2301 [Urine:2300; Stool:1]  Lab Results:  Recent Labs  12/30/12 1810 12/31/12 0525  WBC 7.7 6.6  HGB 16.0 15.0  HCT 45.8 46.1  PLT 291 271   BMET  Recent Labs  12/31/12 0525 01/01/13 0536 01/02/13 0501  NA 133* 129* 132*  K 3.0* 3.5 4.1  CL 93* 91* 95*  CO2 29 25 25   GLUCOSE 201* 378* 300*  BUN 16 17 14   CREATININE 0.92 0.86 0.72  CALCIUM 9.9 9.6 10.2   LFT  Recent Labs  12/31/12 0525  PROT 7.9  ALBUMIN 3.5  AST 11  ALT 11  ALKPHOS 70  BILITOT 0.2*   Hepatitis Panel  Recent Labs  01/01/13 1300  HEPBSAG NEGATIVE  HCVAB Reactive*    Ct Abdomen Pelvis W Contrast  12/31/2012   *RADIOLOGY REPORT*  Clinical Data: Pancreatitis  CT ABDOMEN AND PELVIS WITH CONTRAST  Technique:  Multidetector CT imaging of the abdomen and pelvis was performed following the standard protocol during bolus  administration of intravenous contrast.  Contrast: OMNIPAQUE IOHEXOL 300 MG/ML  SOLN  Comparison: 07/25/2012  Findings: Patchy ground-glass opacities at the lung bases are stable.  Post cholecystectomy.  Diffuse hepatic steatosis.  There are inflammatory changes involving the entire pancreatic parenchyma.  This is characterized by stranding in the adjacent fat and blurring of fascial planes within the pancreas.  No evidence of pancreatic necrosis, hemorrhage, or pseudocyst.  No acute fluid collection.  Small inflammatory nodes are seen about the pancreatic head and body.  Portal vein is patent.  Superior mesenteric vein is patent. Splenic vein is patent.  Spleen, and right kidney are within normal limits.  Stable low density lesion in the lower pole of the left kidney. Stable low density lesion in the right adrenal gland.  Innumerable para-aortic lymph nodes are again noted.  They have become slightly more prominent but remain less than 10 mm in short axis diameter.  No free fluid.  Bladder and prostate are within normal limits.  Normal appendix.  Stable spinal hardware at L4 and L5-4 fusion.  IMPRESSION: Findings are consistent with acute pancreatitis.  No evidence of necrosis, pancreatic hemorrhage, or pseudocyst formation.   Original Report Authenticated By: Jolaine Click, M.D.    Assessment / Plan: * Acute pancreatitis. Had slightly abnormal ampulla on EGD in 12/2011.  Lipase 247 today. ? Cause of  the pancreatitis.  Consider supportive care.  Consider MRCP and/or EUS/ERCP when acute issue has resolved.  Full liquid diet.    * S/p cholecystectomy ~ 2009.  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 * Fatty liver *Unconfirmed hx of hepatitis B treated by health dept with INH but this corresponds with treatment for apparent + TB test (pt actually denies this).  HepBsAg is negative, but HepCAb is positive.  Will check quantitative RNA viral load. *  Hyponatremia.  * IDDM. Not well controlled.  * Hx right heart failure, cor pulmonale.  * Morbid obesity. Has dropped about 25 # in last 7 months.  * Xarelto for hx 2011 bil DVT.  * Hx 08/2011 GEJx ulcer, healed on follow up EGD 12/2011. On daily PPI.     LOS: 3 days   ZEHR, JESSICA D.  01/02/2013, 8:31 AM  Pager number [phone]    GI Attending  I have also seen and assessed the patient and agree with the above note. Iva Boop, MD, [facility] [phone] (pager) 01/02/2013 10:48 AM

## 2013-01-03 LAB — GLUCOSE, CAPILLARY
Glucose-Capillary: 274 mg/dL — ABNORMAL HIGH (ref 70–99)
Glucose-Capillary: 293 mg/dL — ABNORMAL HIGH (ref 70–99)
Glucose-Capillary: 326 mg/dL — ABNORMAL HIGH (ref 70–99)

## 2013-01-03 MED ORDER — HYDROMORPHONE HCL PF 1 MG/ML IJ SOLN
2.0000 mg | INTRAMUSCULAR | Status: DC | PRN
  Administered 2013-01-03 – 2013-01-04 (×4): 2 mg via INTRAVENOUS
  Filled 2013-01-03 (×4): qty 2

## 2013-01-03 MED ORDER — INSULIN GLARGINE 100 UNIT/ML ~~LOC~~ SOLN
24.0000 [IU] | Freq: Every day | SUBCUTANEOUS | Status: DC
  Administered 2013-01-03: 24 [IU] via SUBCUTANEOUS
  Filled 2013-01-03 (×2): qty 0.24

## 2013-01-03 MED ORDER — INSULIN ASPART 100 UNIT/ML ~~LOC~~ SOLN
8.0000 [IU] | Freq: Three times a day (TID) | SUBCUTANEOUS | Status: DC
  Administered 2013-01-03 – 2013-01-04 (×4): 8 [IU] via SUBCUTANEOUS

## 2013-01-03 NOTE — Progress Notes (Signed)
Notified by NT that patient's blood pressure had dropped to 83/53.  Patient requesting IV pain medication at that time.  However, this RN educated the patient that pain medication was not due yet and his blood pressure made pain medication inappropriate.  Patient is agreeable to this.  Re-check of blood pressure about an hour later was improved at 119/65.  RN communicated with MD about findings.  MD advised to hold pain medication if systolic blood pressure is below 100 mmHg.  MD also decreased dosage of Dilaudid from 3 mg every three hours to 2 mg every three hours.  MD also discontinued losartan and diltiazem for now.  Will continue to monitor patient.

## 2013-01-03 NOTE — Progress Notes (Signed)
[facility] PROGRESS NOTE  Ryan [name] ZOX:[id number] DOB: 1950/07/08 DOA: 12/30/2012 PCP: Warrick Parisian, MD  Assessment/Plan:  Acute pancreatitis  Patient reports 10 out of 10 pain whether he's on clears, n.p.o., solids or full liquids. He begins calling the nurse several minutes prior to when his Dilaudid is due every 3 hours. He reports that he has no change in his pain after eating. Chronic pain issues make it difficult to tease out whether his pancreatitis is improving, given h/o of chronic functional abdominal pain. Patient appears about the same clinically since I met him on 12/31/12. He eats 100% of his trays regardless of the consistency. Will discuss with GI.  Diabetes mellitus  Still high. Adjust insulin further.  Chronic pain/narcotic dependence  On chronic high-dose opiates  Atrial flutter  xarelto   HPI/Subjective: No change in pain. Not worsened by advancing diet or after meals. 10 out of 10 continuously regardless of whether or not he has received IV Dilaudid per nursing staff.  They report that he calls continuously when his Dilaudid is due.  Objective: Filed Vitals:   01/03/13 0915  BP: 124/79  Pulse: 87  Temp: 97.1 F (36.2 C)  Resp: 19    Intake/Output Summary (Last 24 hours) at 01/03/13 1006 Last data filed at 01/03/13 0917  Gross per 24 hour  Intake    760 ml  Output   1180 ml  Net   -420 ml   Filed Weights   12/30/12 2258 12/31/12 2128  Weight: 132.269 kg (291 lb 9.6 oz) 130.591 kg (287 lb 14.4 oz)    Exam:   General: Asleep. Arousable. Appears comfortable.  Cardiovascular: RRR without MGR  Respiratory: CTA without WRR  Abdomen: soft, nt, nd, quiet bowel sounds.  Musculoskeletal: bilateral wrapped lower extremities.  Data Reviewed: Basic Metabolic Panel:  Recent Labs Lab 12/30/12 1810 12/31/12 0525 01/01/13 0536 01/02/13 0501  NA 137 133* 129* 132*  K 3.6 3.0* 3.5 4.1  CL 93* 93* 91* 95*  CO2 28 29 25 25    GLUCOSE 219* 201* 378* 300*  BUN 16 16 17 14   CREATININE 0.99 0.92 0.86 0.72  CALCIUM 10.2 9.9 9.6 10.2  MG  --  1.4*  --   --    Liver Function Tests:  Recent Labs Lab 12/30/12 1810 12/31/12 0525  AST 12 11  ALT 12 11  ALKPHOS 74 70  BILITOT 0.2* 0.2*  PROT 8.2 7.9  ALBUMIN 3.6 3.5    Recent Labs Lab 12/30/12 1810 01/01/13 0536 01/02/13 0501  LIPASE 176* 256* 247*   CBC:  Recent Labs Lab 12/30/12 1810 12/31/12 0525  WBC 7.7 6.6  HGB 16.0 15.0  HCT 45.8 46.1  MCV 90.5 92.6  PLT 291 271   BNP (last 3 results)  Recent Labs  04/27/12 0951 08/15/12 0420  PROBNP 78.2 753.0*   CBG:  Recent Labs Lab 01/02/13 0801 01/02/13 1133 01/02/13 1715 01/02/13 2209 01/03/13 0730  GLUCAP 269* 275* 254* 296* 200*    Recent Results (from the past 240 hour(s))  MRSA PCR SCREENING     Status: Abnormal   Collection Time    12/30/12 11:05 PM      Result Value Range Status   MRSA by PCR POSITIVE (*) NEGATIVE Final   Comment:            The GeneXpert MRSA Assay (FDA     approved for NASAL specimens     only), is one component of a  comprehensive MRSA colonization     surveillance program. It is not     intended to diagnose MRSA     infection nor to guide or     monitor treatment for     MRSA infections.     RESULT CALLED TO, READ BACK BY AND VERIFIED WITH:     CALLED TO RN Marlaine Hind [phone] @0253  THANEY     Studies: No results found.  Scheduled Meds: . ALPRAZolam  0.5 mg Oral BID  . Chlorhexidine Gluconate Cloth  6 each Topical Q0600  . diltiazem  180 mg Oral Daily  . DULoxetine  80 mg Oral Daily  . feeding supplement  1 Container Oral Q supper  . gabapentin  300 mg Oral QHS  . insulin aspart  0-20 Units Subcutaneous TID WC  . insulin aspart  6 Units Subcutaneous TID WC  . insulin glargine  20 Units Subcutaneous QHS  . lactulose  20 g Oral TID  . losartan  50 mg Oral Daily  . mupirocin ointment  1 application Nasal BID  . OxyCODONE  30 mg  Oral Q12H  . pantoprazole  40 mg Oral Daily  . polyethylene glycol  17 g Oral Daily  . Rivaroxaban  20 mg Oral Q supper  . traZODone  100 mg Oral QHS   Continuous Infusions:    Christiane Ha, MD  [facility] Pager [phone]. If 7PM-7AM, please contact night-coverage at [email or web address], password [facility] 01/03/2013, 10:06 AM  LOS: 4 days

## 2013-01-04 ENCOUNTER — Institutional Professional Consult (permissible substitution): Payer: Medicare Other | Admitting: Pulmonary Disease

## 2013-01-04 ENCOUNTER — Encounter: Payer: Self-pay | Admitting: Internal Medicine

## 2013-01-04 DIAGNOSIS — E119 Type 2 diabetes mellitus without complications: Secondary | ICD-10-CM

## 2013-01-04 DIAGNOSIS — E43 Unspecified severe protein-calorie malnutrition: Secondary | ICD-10-CM

## 2013-01-04 LAB — BASIC METABOLIC PANEL
BUN: 13 mg/dL (ref 6–23)
Chloride: 94 mEq/L — ABNORMAL LOW (ref 96–112)
GFR calc Af Amer: >90 mL/min (ref >90)
Glucose, Bld: 189 mg/dL — ABNORMAL HIGH (ref 70–99)
Potassium: 4.8 mEq/L (ref 3.5–5.1)

## 2013-01-04 LAB — HCV RNA QUANT: HCV Quantitative: NOT DETECTED IU/mL — ABNORMAL LOW (ref <15)

## 2013-01-04 LAB — GLUCOSE, CAPILLARY: Glucose-Capillary: 180 mg/dL — ABNORMAL HIGH (ref 70–99)

## 2013-01-04 MED ORDER — INSULIN ASPART 100 UNIT/ML ~~LOC~~ SOLN: 8.0000 [IU] | Freq: Three times a day (TID) | SUBCUTANEOUS | Status: DC

## 2013-01-04 MED ORDER — OXYCODONE HCL 5 MG PO TABS
15.0000 mg | ORAL_TABLET | Freq: Four times a day (QID) | ORAL | Status: DC | PRN
  Administered 2013-01-04 (×2): 15 mg via ORAL
  Filled 2013-01-04 (×2): qty 3

## 2013-01-04 MED ORDER — OXYCODONE HCL 5 MG PO TABS: 15.0000 mg | ORAL_TABLET | ORAL | Status: DC | PRN

## 2013-01-04 MED ORDER — INSULIN ASPART 100 UNIT/ML ~~LOC~~ SOLN: 0.0000 [IU] | Freq: Three times a day (TID) | SUBCUTANEOUS | Status: DC

## 2013-01-04 MED ORDER — RIVAROXABAN 20 MG PO TABS: 20.0000 mg | ORAL_TABLET | Freq: Every day | ORAL | Status: AC

## 2013-01-04 MED ORDER — ACETAMINOPHEN 325 MG PO TABS: 650.0000 mg | ORAL_TABLET | Freq: Four times a day (QID) | ORAL | Status: AC | PRN

## 2013-01-04 MED ORDER — ALPRAZOLAM 0.5 MG PO TABS: 0.5000 mg | ORAL_TABLET | Freq: Two times a day (BID) | ORAL | Status: DC

## 2013-01-04 MED ORDER — OXYCODONE HCL ER 30 MG PO T12A: 30.0000 mg | EXTENDED_RELEASE_TABLET | Freq: Two times a day (BID) | ORAL | Status: DC

## 2013-01-04 NOTE — Progress Notes (Signed)
Patient discharged to [facility] by ambulance. Report was called prior to discharge. Patient was given pain medication prior to discharge, nurse at [facility] was notified. Patient was stable upon discharge.

## 2013-01-04 NOTE — Consult Note (Addendum)
WOC consult Note Reason for Consult: Consult requested for bilat legs.  Pt states he wears light compression wraps to avoid edema. He developed blisters and weeping last week.  When wraps are removed today, there is old peeling skin from previous blisters which have resolved.  Few patchy areas of partial thickness wounds, red and moist, each approx .1X.1X.1cm to right leg anterior calf.  Left leg without open wounds or drainage. No odor or edema currently. He only wants the wraps applied to legs, not behind the toes to below the knees. Dressing procedure/placement/frequency: Continue present plan of care with kerlex and acewrap to BLE and change Q week. Pt has assistance at facility where he lives prior to admission. Please re-consult if further assistance is needed.  Thank-you,  Cammie Mcgee MSN, RN, CWOCN, [place], CNS [phone]

## 2013-01-04 NOTE — Progress Notes (Signed)
     [facility] Daily Rounding Note 01/04/2013, 8:13 AM  SUBJECTIVE:       Still requiring Dilaudid.  Taking this as often as he can get it.  The abd pain is in the leftmid to lower quadrant.  Small BM yesterday.  Food, even solids, not causing pain but if he eats when he is having pain, the po's intensify the pain.   OBJECTIVE:         Vital signs in last 24 hours:    Temp:  [97.1 F (36.2 C)-98.6 F (37 C)] 98.6 F (37 C) (09/08 0530) Pulse Rate:  [72-87] 76 (09/08 0530) Resp:  [18-20] 20 (09/08 0530) BP: (83-128)/(53-79) 128/79 mmHg (09/08 0530) SpO2:  [93 %-100 %] 96 % (09/08 0530) Last BM Date: 01/01/13 General: chronically ill looking   Heart: RRR Chest: clear  Abdomen: soft, obese, hypoactive BS.  Tender in left mid and lower abdomen.  Extremities: + LE edema Neuro/Psych:  Oriented x 3.  No tremor.  No gross deficits.   Intake/Output from previous day: 09/07 0701 - 09/08 0700 In: 1780 [P.O.:1680] Out: 2200 [Urine:2200]    Lab Results: No results found for this basename: WBC, HGB, HCT, PLT,  in the last 72 hours BMET  Recent Labs  01/02/13 0501 01/04/13 0445  NA 132* 130*  K 4.1 4.8  CL 95* 94*  CO2 25 27  GLUCOSE 300* 189*  BUN 14 13  CREATININE 0.72 0.76  CALCIUM 10.2 10.4   Hepatitis Panel  Recent Labs  01/01/13 1300  HEPBSAG NEGATIVE  HCVAB Reactive*    ASSESMENT: * Acute pancreatitis.  Hx of same treated in [place].  This is first radiologically confirmed case here in [place] dating to 2009.  Long hx of recurrent abdominal pain with diagnoses of functional abdominal pain.  * S/p cholecystectomy ~ 2009.  * Chronic pain, chronic opiates for spine disease and neuropathic foot pain.  * Fatty liver  * Hep C ab +.  RNA viral load pending. .  * Hyponatremia.  * IDDM. Not well controlled.  * Hx right heart failure, cor pulmonale.  * Morbid obesity.  * Xarelto for hx 2011 bil DVT.  * Hx 08/2011 GEJx ulcer, healed on follow up EGD 12/2011. On  daily PPI.    PLAN: *  I went ahead and made ROV appt with Dr Marina Goodell for 10/6. *  Long term PPI   LOS: 5 days   Jennye Moccasin  01/04/2013, 8:13 AM Pager: [phone]   ________________________________________________________________________  [facility] MD note:  I personally examined the patient, reviewed the data and agree with the assessment and plan described above.  He is very focussed on his pain meds.  He has acute (possibly on chronic) mild pancreatitis.  He will probably require all of his usual, every day pain meds (which is A LOT) for his spinal, neuropathy pains PLUS whatever is needed to control the acute pancreatitis pains.  Longer term, I think evaluation with endoscopic ultrasound is a reasonable step to check for 1. Chronic pancreatitis changes and 2. Small CBD stones.  That should not be for at least many weeks after this acute episode resolves.  This can be arranged at time of his next ROV with Dr. Marina Goodell.   Rob Bunting, MD [facility] Pager [phone]

## 2013-01-04 NOTE — Discharge Summary (Addendum)
Physician Discharge Summary  Ryan [name] EXB:[id number] DOB: March 03, 1951 DOA: 12/30/2012  PCP: Warrick Parisian, MD  Admit date: 12/30/2012 Discharge date: 01/04/2013  Time spent: 45 minutes  Recommendations for Outpatient Follow-up:   [name] still has pain but is stable.  He is eating well without vomiting.  He should continue on his chronic pain medications and vary his diet (clears to low fat solids) based on his pain.  Ensure he has a bowel movement daily  Follow up with Dr. Marina Goodell of [facility] on February 01, 2013.  Adjust insulin if needed.  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  Diet recommendation: low salt, Low fat, diabetic solids.  Vary diet with level of pain.  More pain -> NPO or clears.  Filed Weights   12/30/12 2258 12/31/12 2128  Weight: 132.269 kg (291 lb 9.6 oz) 130.591 kg (287 lb 14.4 oz)    History of present illness:  Ryan [name] is a 62 y.o. male is a nursing home resident, from [facility].  He presents  because of abdominal pain. Apparently patient was diagnosed with ileus and mild acute on chronic pancreatitis and he was treated conservatively. His abdominal pain was not improving as well as his lipase so he was sent to the emergency department for further evaluation. Initial evaluation in the emergency department showed a lipase of 176. When I was called from the emergency department physician there was no blood work done in the hospital, but patient has blood work done today at his nursing home  Hospital Course:  Acute  pancreatitis   Lipase was elevated this admission to 256 at its peak.  CT shows inflammatory changes involving the entire pancreatic parenchyma  The patient was initially treated with NPO status, Aggressive IV hydration and IV pain medications.    His diet was slowly advanced and he was able to tolerate it without difficulty.  But the pain continued.  Has h/o chronic functional abdominal pain  He continually asks for more pain medication and IV pain medication.  Per GI, he has a functional component to his pain.    What is being done in the hospital can be done at SNF - Diet control and pain medications.  No further inpatient work up per GI.  Follow up with GI on 10/6 for further imaging once the pancreas calms down.  Torsemide was discontinued as it can cause pancreatitis  Diabetes mellitus   Hgb A1C is 9.6 on 9.06/2012  Treated with Insulin and metformin.  Insulin increased.  PCP follow up for tighter control.  Chronic pain/narcotic dependence   On chronic high-dose opiates   PRN pain med frequency increased to q 3 hours due to acute component of pain  Atrial flutter   xarelto   Lower extremity blisters and swelling  Wound care consultation   Low Salt Diet  Elevate legs above heart when lying down  Continue Kerlex and acewrap to bilateral lower ext.  Change once a week.  Consultations:  Jarvis Newcomer  Discharge Exam: Filed Vitals:   01/04/13 0530  BP: 128/79  Pulse: 76  Temp: 98.6 F (37 C)  Resp: 20    General: Tall, obese male, calm, nad eating lunch Cardiovascular: rrr, no m/r/g Respiratory: cta no w/c/r Abdomen:  Obese, soft, decreased bowel sounds, nontender to palpation, no masses Extremities:  Lower extremities wrapped.  Discharge Instructions      Discharge Orders   Future Appointments Provider Department Dept Phone   01/05/2013 3:15 PM Kalman Shan, MD [facility] [phone]   01/11/2013 2:15 PM Wendall Stade, MD  [facility] Main Office [place]) [phone]   02/01/2013 2:15 PM Hilarie Fredrickson, MD [facility] [phone]   Future Orders Complete By Expires   Diet general  As directed    Comments:     Low fat, bland diet.  If Mr. Mineo has pain back diet down to liquids or just clear liquids.   Increase activity slowly  As directed        Medication List    STOP taking these medications       torsemide 20 MG tablet  Commonly known as:  DEMADEX      TAKE these medications       acetaminophen 325 MG tablet  Commonly known as:  TYLENOL  Take 2 tablets (650 mg total) by mouth every 6 (six) hours as needed.     ALPRAZolam 0.5 MG tablet  Commonly known as:  XANAX  Take 1 tablet (0.5 mg total) by mouth 2 (two) times daily.     diltiazem 180 MG 24 hr capsule  Commonly known as:  CARDIZEM CD  Take 180 mg by mouth daily.     DULoxetine 60 MG capsule  Commonly known as:  CYMBALTA  Take 60 mg by mouth daily.     DULoxetine 20 MG capsule  Commonly known as:  CYMBALTA  Take 20 mg by mouth daily.     gabapentin 300 MG capsule  Commonly known as:  NEURONTIN  Take 300 mg by mouth at bedtime.     Hydrophor Oint  Apply 1 application topically 2 (two) times daily as needed (apply bilaterally from knees to feet for dry skin).     hydroxypropyl methylcellulose 2.5 % ophthalmic solution  Commonly known as:  ISOPTO TEARS  Place 1 drop into the right eye 3 (three) times daily.     insulin glargine 100 UNIT/ML injection  Commonly known as:  LANTUS  Inject 25 Units into the skin at bedtime.     insulin glulisine 100 UNIT/ML injection  Commonly known as:  APIDRA  Inject 5 Units into the skin 2 (two) times daily. Prior to lunch and supper     lactulose 10 GM/15ML solution  Commonly known as:  CHRONULAC  Take 30 mLs by mouth 3 (three) times daily.     losartan 50 MG tablet  Commonly known as:  COZAAR  Take 50 mg by mouth daily. Hold for sbp<110, repeat blood  pressure in 1 hour     Melatonin 3 MG Tabs  Take 3 mg by mouth at bedtime.     metFORMIN 850 MG tablet  Commonly known as:  GLUCOPHAGE  Take 850 mg by mouth 3 (three) times daily with meals.     ondansetron 8 MG disintegrating tablet  Commonly known as:  ZOFRAN-ODT  Take 8 mg by mouth every 8 (eight) hours as needed for nausea.     OxyCODONE HCl ER 30 MG T12a  Commonly known as:  OXYCONTIN  Take 30 mg by mouth every  12 (twelve) hours.     oxyCODONE 5 MG immediate release tablet  Commonly known as:  Oxy IR/ROXICODONE  Take 3 tablets (15 mg total) by mouth every 3 (three) hours as needed for pain.     pantoprazole 40 MG tablet  Commonly known as:  PROTONIX  Take 40 mg by mouth daily.     polyethylene glycol packet  Commonly known as:  MIRALAX / GLYCOLAX  Take 17 g by mouth daily.     Rivaroxaban 20 MG Tabs tablet  Commonly known as:  XARELTO  Take 1 tablet (20 mg total) by mouth daily with supper.     senna-docusate 8.6-50 MG per tablet  Commonly known as:  Senokot-S  Take 1 tablet by mouth 2 (two) times daily. Hold for diarrhea     traZODone 100 MG tablet  Commonly known as:  DESYREL  Take 100 mg by mouth at bedtime.     Vitamin D 2000 UNITS Caps  Take 2,000 Units by mouth daily.       Allergies  Allergen Reactions  . Lisinopril Swelling and Rash  . Morphine And Related Swelling and Rash   Follow-up Information   Follow up with Yancey Flemings, MD On 02/01/2013. (2:15 PM to follow up pancreatitis. )    Specialty:  Gastroenterology   Contact information:   [address] [place] [address] [phone]        The results of significant diagnostics from this hospitalization (including imaging, microbiology, ancillary and laboratory) are listed below for reference.    Significant Diagnostic Studies: Ct Abdomen Pelvis W Contrast  12/31/2012   *RADIOLOGY REPORT*  Clinical Data: Pancreatitis  CT ABDOMEN AND PELVIS WITH CONTRAST  Technique:  Multidetector CT  imaging of the abdomen and pelvis was performed following the standard protocol during bolus administration of intravenous contrast.  Contrast: OMNIPAQUE IOHEXOL 300 MG/ML  SOLN  Comparison: 07/25/2012  Findings: Patchy ground-glass opacities at the lung bases are stable.  Post cholecystectomy.  Diffuse hepatic steatosis.  There are inflammatory changes involving the entire pancreatic parenchyma.  This is characterized by stranding in the adjacent fat and blurring of fascial planes within the pancreas.  No evidence of pancreatic necrosis, hemorrhage, or pseudocyst.  No acute fluid collection.  Small inflammatory nodes are seen about the pancreatic head and body.  Portal vein is patent.  Superior mesenteric vein is patent. Splenic vein is patent.  Spleen, and right kidney are within normal limits.  Stable low density lesion in the lower pole of the left kidney. Stable low density lesion in the right adrenal gland.  Innumerable para-aortic lymph nodes are again noted.  They have become slightly more prominent but remain less than 10 mm in short axis diameter.  No free fluid.  Bladder and prostate are within normal limits.  Normal appendix.  Stable spinal hardware at L4 and L5-4 fusion.  IMPRESSION: Findings are consistent with acute pancreatitis.  No evidence of necrosis, pancreatic hemorrhage, or pseudocyst formation.   Original Report Authenticated By: Jolaine Click, M.D.    Microbiology: Recent Results (from the past 240 hour(s))  MRSA PCR SCREENING     Status: Abnormal   Collection Time    12/30/12 11:05 PM      Result Value Range Status   MRSA by PCR POSITIVE (*) NEGATIVE Final   Comment:            The GeneXpert MRSA Assay (FDA     approved for NASAL specimens  only), is one component of a     comprehensive MRSA colonization     surveillance program. It is not     intended to diagnose MRSA     infection nor to guide or     monitor treatment for     MRSA infections.     RESULT CALLED  TO, READ BACK BY AND VERIFIED WITH:     CALLED TO RN Marlaine Hind [id number] @0253  THANEY     Labs: Basic Metabolic Panel:  Recent Labs Lab 12/30/12 1810 12/31/12 0525 01/01/13 0536 01/02/13 0501 01/04/13 0445  NA 137 133* 129* 132* 130*  K 3.6 3.0* 3.5 4.1 4.8  CL 93* 93* 91* 95* 94*  CO2 28 29 25 25 27   GLUCOSE 219* 201* 378* 300* 189*  BUN 16 16 17 14 13   CREATININE 0.99 0.92 0.86 0.72 0.76  CALCIUM 10.2 9.9 9.6 10.2 10.4  MG  --  1.4*  --   --   --    Liver Function Tests:  Recent Labs Lab 12/30/12 1810 12/31/12 0525  AST 12 11  ALT 12 11  ALKPHOS 74 70  BILITOT 0.2* 0.2*  PROT 8.2 7.9  ALBUMIN 3.6 3.5    Recent Labs Lab 12/30/12 1810 01/01/13 0536 01/02/13 0501 01/04/13 0445  LIPASE 176* 256* 247* 236*     Recent Labs Lab 12/30/12 1810 12/31/12 0525  WBC 7.7 6.6  HGB 16.0 15.0  HCT 45.8 46.1  MCV 90.5 92.6  PLT 291 271   BNP: BNP (last 3 results)  Recent Labs  04/27/12 0951 08/15/12 0420  PROBNP 78.2 753.0*   CBG:  Recent Labs Lab 01/03/13 1359 01/03/13 1613 01/03/13 2055 01/04/13 0759 01/04/13 1222  GLUCAP 290* 293* 326* 155* 180*    Signed:  Conley Canal  [facility] 01/04/2013, 1:02 PM  Attending note: Interviewed and examined.  Agree  Crista Curb, M.D.

## 2013-01-05 ENCOUNTER — Ambulatory Visit (INDEPENDENT_AMBULATORY_CARE_PROVIDER_SITE_OTHER)
Admission: RE | Admit: 2013-01-05 | Discharge: 2013-01-05 | Disposition: A | Discharge status: Home / Self Care | Payer: PRIVATE HEALTH INSURANCE | Source: Ambulatory Visit | Attending: Internal Medicine | Admitting: Internal Medicine

## 2013-01-05 ENCOUNTER — Ambulatory Visit (INDEPENDENT_AMBULATORY_CARE_PROVIDER_SITE_OTHER): Payer: PRIVATE HEALTH INSURANCE | Admitting: Internal Medicine

## 2013-01-05 ENCOUNTER — Other Ambulatory Visit: Payer: Self-pay | Admitting: *Deleted

## 2013-01-05 ENCOUNTER — Encounter: Payer: Self-pay | Admitting: Internal Medicine

## 2013-01-05 VITALS — BP 130/80 | HR 85 | Temp 98.0°F | Ht 77.0 in

## 2013-01-05 DIAGNOSIS — J9620 Acute and chronic respiratory failure, unspecified whether with hypoxia or hypercapnia: Secondary | ICD-10-CM

## 2013-01-05 DIAGNOSIS — J9600 Acute respiratory failure, unspecified whether with hypoxia or hypercapnia: Secondary | ICD-10-CM

## 2013-01-05 MED ORDER — OXYCODONE HCL 5 MG PO TABS: 15.0000 mg | ORAL_TABLET | ORAL | Status: DC | PRN

## 2013-01-05 NOTE — Progress Notes (Signed)
Subjective:    Patient ID: Ryan [name], male    DOB: 12-02-1950, 61 y.o.   MRN: [id number] Kathy Breach, MD  HPI  Office visit 9 /12/2012   62 year old obese male with multiple medical problems. He was hospitalized in April 2014 for acute respiratory failure secondary to pulmonary infiltrate in the left lung along with urinary tract infection Escherichia coli related sepsis. He was on the ventilator for acute respiratory failure and subsequently discharged. I think he was supposed to follow here for resolution of these infiltrates but did not make any visit do 2 other health issues. July 2014 he did see cardiologist and had a lower extremity  Doppler the best I can gather is negative for deep vein thrombosis. Then again this month of 12/30/2012 to 01/04/2013 he was admitted as an inpatient for acute pancreatitis. I spoke with the nurse practitioner who discharged him and she says that he had absolutely no respiratory issues during this admission. Currently in the office he is denying any shortness of breath or cough and feels well. 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    has past surgical history that includes Esophagogastroduodenoscopy (09/22/2011);  Esophagogastroduodenoscopy (01/05/2012); Cholecystectomy (~ 2009); Spinal fusion (~ 2011); Tonsillectomy and adenoidectomy; and Coronary angioplasty with stent (~ 2009`).  Review of Systems  Constitutional: Negative for fever and unexpected weight change.  HENT: Negative for ear pain, nosebleeds, congestion, sore throat, rhinorrhea, sneezing, trouble swallowing, dental problem, postnasal drip and sinus pressure.   Eyes: Negative for redness and itching.  Respiratory: Positive for shortness of breath. Negative for cough, chest tightness and wheezing.   Cardiovascular: Negative for palpitations and leg swelling.  Gastrointestinal: Negative for nausea and vomiting.  Genitourinary: Negative for dysuria.  Musculoskeletal: Negative for joint swelling.  Skin: Negative for rash.  Neurological: Negative for headaches.  Hematological: Does not bruise/bleed easily.  Psychiatric/Behavioral: Negative for dysphoric mood. The patient is not nervous/anxious.        Objective:   Physical Exam  Nursing note and vitals reviewed. Constitutional: He is oriented to person, place, and time. He appears well-developed and well-nourished. No distress.  Sitting in wheel chair  HENT:  Head: Normocephalic and atraumatic.  Right Ear: External ear normal.  Left Ear: External ear normal.  Mouth/Throat: Oropharynx is clear and moist. No oropharyngeal exudate.  Eyes: Conjunctivae and EOM are normal. Pupils are equal, round, and reactive to light. Right eye exhibits no discharge. Left eye exhibits no discharge. No scleral icterus.  Neck: Normal range of motion. Neck supple. No JVD present. No tracheal deviation present. No thyromegaly present.  Cardiovascular: Normal rate, regular rhythm and intact distal pulses.  Exam reveals no gallop and no friction rub.   No murmur heard. Pulmonary/Chest: Effort normal and breath sounds normal. No respiratory distress. He has no wheezes. He has no rales. He exhibits no tenderness.   Abdominal:  Soft. Bowel sounds are normal. He exhibits no distension and no mass. There is no tenderness. There is no rebound and no guarding.  Musculoskeletal: Normal range of motion. He exhibits no edema and no tenderness.  Chronic venous stasis edema +  Lymphadenopathy:    He has no cervical adenopathy.  Neurological: He is alert and oriented to person, place, and time. He has normal reflexes. No cranial nerve deficit. Coordination normal.  Skin: Skin is warm and dry. No rash noted. He is not diaphoretic. No erythema. No pallor.  Psychiatric: He has a normal mood and affect. His behavior is normal. Judgment and thought content normal.  Obese flat affect          Assessment & Plan:

## 2013-01-05 NOTE — Patient Instructions (Addendum)
#  history of respiratory failure  - glad you are better - have CXR today; will call with results. No followup if CXR okay  #Pancreatitis  - will have my front desk see if [facility] can see you sooner due to ongoing pain

## 2013-01-07 ENCOUNTER — Encounter: Payer: Self-pay | Admitting: Internal Medicine

## 2013-01-07 ENCOUNTER — Telehealth: Payer: Self-pay | Admitting: Internal Medicine

## 2013-01-07 ENCOUNTER — Non-Acute Institutional Stay (SKILLED_NURSING_FACILITY): Payer: PRIVATE HEALTH INSURANCE | Admitting: Internal Medicine

## 2013-01-07 ENCOUNTER — Ambulatory Visit (INDEPENDENT_AMBULATORY_CARE_PROVIDER_SITE_OTHER): Payer: PRIVATE HEALTH INSURANCE | Admitting: Gastroenterology

## 2013-01-07 ENCOUNTER — Telehealth: Payer: Self-pay

## 2013-01-07 ENCOUNTER — Encounter: Payer: Self-pay | Admitting: Gastroenterology

## 2013-01-07 VITALS — BP 130/80 | HR 80 | Ht 77.0 in | Wt 318.0 lb

## 2013-01-07 DIAGNOSIS — K859 Acute pancreatitis without necrosis or infection, unspecified: Secondary | ICD-10-CM

## 2013-01-07 DIAGNOSIS — F341 Dysthymic disorder: Secondary | ICD-10-CM

## 2013-01-07 DIAGNOSIS — E1149 Type 2 diabetes mellitus with other diabetic neurological complication: Secondary | ICD-10-CM

## 2013-01-07 DIAGNOSIS — G909 Disorder of the autonomic nervous system, unspecified: Secondary | ICD-10-CM

## 2013-01-07 DIAGNOSIS — G4700 Insomnia, unspecified: Secondary | ICD-10-CM

## 2013-01-07 DIAGNOSIS — E1143 Type 2 diabetes mellitus with diabetic autonomic (poly)neuropathy: Secondary | ICD-10-CM

## 2013-01-07 DIAGNOSIS — M48061 Spinal stenosis, lumbar region without neurogenic claudication: Secondary | ICD-10-CM

## 2013-01-07 DIAGNOSIS — I10 Essential (primary) hypertension: Secondary | ICD-10-CM

## 2013-01-07 DIAGNOSIS — K219 Gastro-esophageal reflux disease without esophagitis: Secondary | ICD-10-CM

## 2013-01-07 DIAGNOSIS — E1165 Type 2 diabetes mellitus with hyperglycemia: Secondary | ICD-10-CM

## 2013-01-07 DIAGNOSIS — I4892 Unspecified atrial flutter: Secondary | ICD-10-CM

## 2013-01-07 DIAGNOSIS — F329 Major depressive disorder, single episode, unspecified: Secondary | ICD-10-CM

## 2013-01-07 NOTE — Progress Notes (Signed)
01/07/2013 Ryan [name] [id number] [birth year]   History of Present Illness:  Patient is a pleasant 61 year old male who was just discharged 3 days ago from a hospital stay for pancreatitis of unknown etiology.  He was supposed to follow-up with Dr. Marina [name] in early October and schedule EUS at that time, however, we were asked to see him earlier for ongoing abdominal pain.  He says that he is still having a lot of pain, but on exam he does not express a lot of tenderness.  He was switched to a regular diet this morning and ate some grits.  He is drinking a diet Dr. Reino [name] during his office visit today.  He is passing gas and having BM's.  No vomiting.  Current Medications, Allergies, Past Medical History, Past Surgical History, Family History and Social History were reviewed in Ryan [name] record.   Physical Exam: BP 130/80  Pulse 80  Ht 6\' 5"  (1.956 m)  Wt 318 lb (144.244 kg)  BMI 37.7 kg/m2 General: Well developed, obese black male in no acute distress Head: Normocephalic and atraumatic Eyes:  sclerae anicteric, conjunctiva pink  Ears: Normal auditory acuity Lungs: Clear throughout to auscultation Heart: Regular rate and rhythm Abdomen: Soft, obese, non-distended.  BS present.  Minimal tenderness on exam in epigastrium. Musculoskeletal: Symmetrical with no gross deformities  Extremities: Both LE's wrapped with dressings. Neurological: Alert oriented x 4, grossly nonfocal Psychological:  Alert and cooperative. Normal mood and affect  Assessment and Recommendations: -Pancreatitis:  ? Source.  We will have a nutritionist/dietician see him to help with his caloric intake.  He needs to be following a low fat diet with nutritional supplementation while he is not eating much solid food.  Advised him that it is going to take time for his pain to resolve completely.  He has a lot of chronic underlying pain, which is going to make his abdominal pain harder to control.  He  will need an EUS eventually as well, and we are going to get Dr. Christella [name] input as to timing of the procedure, etc in order to get this scheduled.

## 2013-01-07 NOTE — Telephone Encounter (Signed)
Message copied by Donata Duff on Thu Jan 07, 2013  4:49 PM ------      Message from: Rob Bunting P      Created: Thu Jan 07, 2013  4:20 PM                   ----- Message -----         From: Princella Pellegrini. Zehr, PA-C         Sent: 01/07/2013   4:04 PM           To: Rachael Fee, MD             ------

## 2013-01-07 NOTE — Progress Notes (Signed)
Patient ID: Ryan [name], male   DOB: [birth year], 63 y.o.   MRN: [id number]  [name]   Chief Complaint  Patient presents with  . Medical Managment of Chronic Issues    re-admit from hospital   Allergies  Allergen Reactions  . Lisinopril Swelling and Rash  . Morphine And Related Swelling and Rash   HPI  62 y/o male patient is a long term care resident in the facility.he was in the hospital will abdominal pain and had acute on chronic pancreatits. He was put on iv fluid and pain medication. No clear etiology was identified. He was also seen by GI service today and recommend low fat diet and has EUS planned on next visit.  He was seen in his room today. He complaints of pain in his abdomen, mainly in epigastric area. He also has pain in legs and back. It is chronic in nature  Reviewed notes from hospital and GI. demadex was held with it increasing risk for pancreatitis.   Review of Systems  Constitutional: Negative for fever, chills and diaphoresis.  HENT: Negative for congestion.   Eyes: Negative for blurred vision.  Respiratory: Negative for cough and shortness of breath.   Cardiovascular: Negative for chest pain and claudication. Negative for leg swelling Gastrointestinal: Negative for heartburn, nausea, vomiting,and constipation. He has loose stools Genitourinary: Negative for dysuria.  Musculoskeletal: Positive for joint pain.  Skin: Negative for rash.  Neurological: Negative for dizziness, sensory change and headaches.  Psychiatric/Behavioral: Negative for depression. He is craving for cigarettes and is asking people/ visitor for cigarettes  Past Medical History  Diagnosis Date  . Hypertension   . DVT (deep venous thrombosis) ~ 2011    BLE  . 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 Hyperlipidemia   . CAD (coronary atherosclerotic disease)     s/p stents x2  . Small bowel obstruction   . Pancreatitis   . Anxiety   . GERD  (gastroesophageal reflux disease)   . CHF (congestive heart failure)   . Myocardial infarction ~ 2009  . Anginal pain   . Pneumonia ~ 2012  . Shortness of breath     "lying down & w/exertion" (03/04/2012)  . Type II diabetes mellitus   . 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 Chronic lower back pain   . PTSD (post-traumatic stress disorder)   . Hernia   . Narcotic addiction   . DDD (degenerative disc disease)     multilevel   . Hepatitis C 01/02/2013    Past Surgical History  Procedure Laterality Date  . Esophagogastroduodenoscopy  09/22/2011    Procedure: ESOPHAGOGASTRODUODENOSCOPY (EGD);  Surgeon: Charna Elizabeth, MD;  Location: [facility] ENDOSCOPY;  Service: Endoscopy;  Laterality: N/A;  . Esophagogastroduodenoscopy  01/05/2012    Procedure: ESOPHAGOGASTRODUODENOSCOPY (EGD);  Surgeon: Rachael Fee, MD;  Location: [facility] ENDOSCOPY;  Service: Endoscopy;  Laterality: N/A;  . Cholecystectomy  ~ 2009  . Spinal fusion  ~ 2011    lumbar spine.   . Tonsillectomy and adenoidectomy    . Coronary angioplasty with stent placement  ~ 2009`    "1 + ! (after 1st one failed)" (03/04/2012)    Family History  Problem Relation Age of Onset  . Diabetes Father   . Colon cancer Neg Hx    History   Social History  . Marital Status: Legally  Separated    Spouse Name: N/A    Number of Children: 1  . Years of Education: N/A   Occupational History  . Disabled    Social History Main Topics  . Smoking status: Former Smoker -- 0.50 packs/day for 38 years    Types: Cigarettes    Quit date: 06/24/2012  . Smokeless tobacco: Never Used     Comment: just recently quit 3-4 months ago  . 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 Drug Use: Yes    Special: Marijuana     Comment: 03/04/2012 "last marijuana ~ 15-20 yr ago"  . Sexual Activity: Not Currently   Other  Topics Concern  . Not on file   Social History Narrative  . No narrative on file   Current Outpatient Prescriptions on File Prior to Visit  Medication Sig Dispense Refill  . ALPRAZolam (XANAX) 0.5 MG tablet Take 1 tablet (0.5 mg total) by mouth 2 (two) times daily.  30 tablet  0  . Cholecalciferol (VITAMIN D) 2000 UNITS CAPS Take 2,000 Units by mouth daily.      Marland Kitchen diltiazem (CARDIZEM CD) 180 MG 24 hr capsule Take 180 mg by mouth daily.      . DULoxetine (CYMBALTA) 20 MG capsule Take 20 mg by mouth daily.      . DULoxetine (CYMBALTA) 60 MG capsule Take 60 mg by mouth daily.      Marland Kitchen gabapentin (NEURONTIN) 300 MG capsule Take 300 mg by mouth at bedtime.      . hydroxypropyl methylcellulose (ISOPTO TEARS) 2.5 % ophthalmic solution Place 1 drop into the right eye 3 (three) times daily.      . insulin aspart (NOVOLOG) 100 UNIT/ML injection Inject 0-20 Units into the skin 3 (three) times daily with meals.  1 vial  12  . insulin glargine (LANTUS) 100 UNIT/ML injection Inject 25 Units into the skin at bedtime.       Marland Kitchen lactulose (CHRONULAC) 10 GM/15ML solution Take 30 mLs by mouth 3 (three) times daily.       Marland Kitchen losartan (COZAAR) 50 MG tablet Take 50 mg by mouth daily. Hold for sbp<110, repeat blood pressure in 1 hour      . Melatonin 3 MG TABS Take 3 mg by mouth at bedtime.      . metFORMIN (GLUCOPHAGE) 1000 MG tablet Take 1,000 mg by mouth 2 (two) times daily with a meal.      . ondansetron (ZOFRAN-ODT) 8 MG disintegrating tablet Take 8 mg by mouth every 8 (eight) hours as needed for nausea.      Marland Kitchen oxyCODONE (OXY IR/ROXICODONE) 5 MG immediate release tablet Take 3 tablets (15 mg total) by mouth every 3 (three) hours as needed for pain.  360 tablet  0  . OxyCODONE HCl ER (OXYCONTIN) 30 MG T12A Take 30 mg by mouth every 12 (twelve) hours.  60 each  0  . pantoprazole (PROTONIX) 40 MG tablet Take 40 mg by mouth daily.      . polyethylene glycol (MIRALAX / GLYCOLAX) packet Take 17 g by mouth daily.      .  Rivaroxaban (XARELTO) 20 MG TABS tablet Take 1 tablet (20 mg total) by mouth daily with supper.  30 tablet  0  . senna-docusate (SENOKOT-S) 8.6-50 MG per tablet Take 1 tablet by mouth 2 (two) times daily. Hold for  diarrhea      . traZODone (DESYREL) 100 MG tablet Take 100 mg by mouth at bedtime.      Marland Kitchen acetaminophen (TYLENOL) 325 MG tablet Take 2 tablets (650 mg total) by mouth every 6 (six) hours as needed.      . Emollient (HYDROPHOR) OINT Apply 1 application topically 2 (two) times daily as needed (apply bilaterally from knees to feet for dry skin).       No current facility-administered medications on file prior to visit.     BP 130/85  Pulse 62  Temp(Src) 97.8 F (36.6 C)  Resp 18  SpO2 96%  Physical Exam  Constitutional: He is oriented to person, place, and time. He appears well-developed and well-nourished. No distress.  HENT:   Head: Normocephalic and atraumatic.   Nose: Nose normal.   Mouth/Throat: Oropharynx is clear and moist.  Eyes: Conjunctivae are normal. Pupils are equal, round, and reactive to light.  Neck: Normal range of motion. Neck supple. No JVD present. No thyromegaly present.  Cardiovascular: Normal rate and regular rhythm.  no wheeze. Basilar crackles bilaterally Pulmonary/Chest: Effort normal and breath sounds normal. No respiratory distress. He has no wheezes. He exhibits no tenderness.  Abdominal: Soft. Bowel sounds are normal. There is mild epigastric tenderness but no guarding or rigidity Musculoskeletal: Normal range of motion.  Weakness in lower extremities, propels self in wheelchair,  Trace edema both legs, chronic vascular changes in both leg  Lymphadenopathy:    He has no cervical adenopathy.  Neurological: He is alert and oriented to person, place, and time. No cranial nerve deficit.  Skin: Skin is warm and dry. He is not diaphoretic.  Psychiatric: He has a normal mood and affect. His behavior is normal.    Labs- CBC    Component Value  Date/Time   WBC 6.6 12/31/2012 0525   RBC 4.98 12/31/2012 0525   HGB 15.0 12/31/2012 0525   HCT 46.1 12/31/2012 0525   PLT 271 12/31/2012 0525   MCV 92.6 12/31/2012 0525   MCH 30.1 12/31/2012 0525   MCHC 32.5 12/31/2012 0525   RDW 14.1 12/31/2012 0525   LYMPHSABS 1.7 11/10/2012 1006   MONOABS 0.9 11/10/2012 1006   EOSABS 0.4 11/10/2012 1006   BASOSABS 0.0 11/10/2012 1006   CMP     Component Value Date/Time   NA 130* 01/04/2013 0445   K 4.8 01/04/2013 0445   CL 94* 01/04/2013 0445   CO2 27 01/04/2013 0445   GLUCOSE 189* 01/04/2013 0445   BUN 13 01/04/2013 0445   CREATININE 0.76 01/04/2013 0445   CALCIUM 10.4 01/04/2013 0445   PROT 7.9 12/31/2012 0525   ALBUMIN 3.5 12/31/2012 0525   AST 11 12/31/2012 0525   ALT 11 12/31/2012 0525   ALKPHOS 70 12/31/2012 0525   BILITOT 0.2* 12/31/2012 0525   GFRNONAA >90 01/04/2013 0445   GFRAA >90 01/04/2013 0445   Lipid Panel     Component Value Date/Time   CHOL 146 07/17/2011 2021   TRIG 82 07/17/2011 2021   HDL 46 07/17/2011 2021   CHOLHDL 3.2 07/17/2011 2021   VLDL 16 07/17/2011 2021   LDLCALC 84 07/17/2011 2021   01/06/13 na 140, k 4.2, glu 174, bun 5, cr 0.7, alb 3.2, lft wnl, amylase 55, lipase 265 01/05/13 a1c 10  01/06/13 cxr- mild pulmonary vascular congestion. No effusion, patchy basilar atelectasis vs interstitial pneumonitis  Assessment/Plan  Pancreatitis- acute on chronic pancreatitis. Reviewed recent amylase and lipase. Will keep him on clear liquid  diet for now and advance to low fat diet as tolerated. Has upcoming EUS planned to assess for etiology. After resolution of acute phase, can consider pancreatic enzyme addition. For now, continue oxycontin 30 mg bid with oxycodone 15 mg q4h prn. Hold for sedation. Patient also has known hx of chronic pain and thus keeping his abdominal and chronic pain under control can be difficult  Peripheral neuropathy- continue neurontin 300 mg daily, monitor for now  Spinal stenosis and chronic pain- continue oxycontin 30 mg bid and continue  prn oxycodone  Dm type 2- continue lantus and SSI. Has elevated a1c. Monitor cbg. If fasting cbg remains elevated, will need to adjust insulin dosage. Continue metformin  Afib- rate currently controlled with cardizem, continue this and xarelto  chf- breathing stable, lungs have basilar crackles and has trace edema on exam. Also reviewed cxr. Will have him on demadex 20 mg daily for now  Depression- continue xanax and cymbalta  Loose stool- will decrease lactulose to once a day from tid for now. Monitor for constipation given him being on high dose of narcotics  HTN- stable, continue losartan  GERD- continue protonix for now  Insomnia- continue ambien and trazodone  Family/ staff Communication: reviewed care plan with nursing staff and patient

## 2013-01-07 NOTE — Telephone Encounter (Signed)
Per OV note from 01/05/13:  Patient Instructions    #history of respiratory failure  - glad you are better  - have CXR today; will call with results. No followup if CXR okay  #Pancreatitis  - will have my front desk see if [facility] can see you sooner due to ongoing pain   ------  Spoke with Bonita Quin with Dr. Lamar Sprinkles office.  We have scheduled pt to see Shanda Bumps, [place] today at 2:00 pm.  Pt will need to arrive at 1:45 pm.  Thana Farr, spoke with pt's nurse, Alinda Money.  Was advised they would set up transportation to have pt at [facility] today at 1:45 pm for 2:00 pm appt.    Called, spoke with pt's mother.  Informed her of pt's pending OV today along with time and location.  She verbalized understanding and will plan on meeting pt for appt.

## 2013-01-07 NOTE — Patient Instructions (Addendum)
You should see a nutritionist at the nursing home for management of caloric intake in the setting of pancreatitis. We have given the nursing home an order to get this consult.  You will be scheduled for an endoscopic ultrasound (EUS) in the near future. We will be in touch with you and the nursing home about this.   Please keep your scheduled appointment with Dr Marina Goodell on 02/01/13 @ 2:15 pm.

## 2013-01-07 NOTE — Progress Notes (Signed)
I will forward to Patty to help schedule upper EUS, radial +/- linear, 60 min, ++MAC, for recurrent pancreatitis, in 4-5 weeks from now at [facility].  Thanks

## 2013-01-07 NOTE — Telephone Encounter (Signed)
Pulmonary called and requested pt be seen sooner than scheduled appt. Pt scheduled to see Doug Sou PA today at 2pm. Pt had pancreatitis and is c/o abdominal pain. Pulmonary to notify pt of appt.

## 2013-01-07 NOTE — Progress Notes (Signed)
Chronic underlying pain without organic cause on multiple occasions makes his assessment and treatment difficult as noted. Agree with initial assessment and plans

## 2013-01-08 ENCOUNTER — Other Ambulatory Visit: Payer: Self-pay | Admitting: *Deleted

## 2013-01-08 MED ORDER — OXYCODONE HCL 15 MG PO TABS: ORAL_TABLET | ORAL | Status: DC

## 2013-01-08 NOTE — Telephone Encounter (Signed)
Needs EUS October Do you want him to hold Xarelto?

## 2013-01-08 NOTE — Telephone Encounter (Signed)
I will forward to Haidynn Almendarez to help schedule upper EUS, radial +/- linear, 60 min, ++MAC, for recurrent pancreatitis, in 4-5 weeks from now at [facility].  Thanks  

## 2013-01-10 NOTE — Telephone Encounter (Signed)
Yes, he will need to hold xarelto, will need to ask his cardiologist if that is OK (5 days off).  Thanks

## 2013-01-10 NOTE — Assessment & Plan Note (Signed)
#  history of respiratory failure in aproil 2014. Now resolved and asymptomatic from resp statndpoint  PLAN  - glad you are better - have CXR today; will call with results. No followup if CXR okay

## 2013-01-11 ENCOUNTER — Ambulatory Visit (INDEPENDENT_AMBULATORY_CARE_PROVIDER_SITE_OTHER): Payer: PRIVATE HEALTH INSURANCE | Admitting: Cardiovascular Disease

## 2013-01-11 ENCOUNTER — Other Ambulatory Visit: Payer: Self-pay

## 2013-01-11 ENCOUNTER — Encounter (HOSPITAL_COMMUNITY): Payer: Self-pay | Admitting: *Deleted

## 2013-01-11 ENCOUNTER — Encounter (HOSPITAL_COMMUNITY): Payer: Self-pay | Admitting: Pharmacy Technician

## 2013-01-11 VITALS — BP 130/62 | HR 102 | Ht 77.0 in | Wt 317.0 lb

## 2013-01-11 DIAGNOSIS — K859 Acute pancreatitis without necrosis or infection, unspecified: Secondary | ICD-10-CM

## 2013-01-11 DIAGNOSIS — I4892 Unspecified atrial flutter: Secondary | ICD-10-CM

## 2013-01-11 DIAGNOSIS — I2510 Atherosclerotic heart disease of native coronary artery without angina pectoris: Secondary | ICD-10-CM

## 2013-01-11 DIAGNOSIS — I10 Essential (primary) hypertension: Secondary | ICD-10-CM

## 2013-01-11 NOTE — Assessment & Plan Note (Signed)
F/U GI  Abdominal pain better  Lipase was still elevated  Plan per GI

## 2013-01-11 NOTE — Assessment & Plan Note (Signed)
Stable with no angina and good activity level.  Continue medical Rx  

## 2013-01-11 NOTE — Assessment & Plan Note (Signed)
Resolved Continue cardizem and xarelto  Ok ot stop xarelto for any endoscopy or GI procedure

## 2013-01-11 NOTE — Patient Instructions (Signed)
Your physician wants you to follow-up in:  6 MONTHS WITH DR NISHAN  You will receive a reminder letter in the mail two months in advance. If you don't receive a letter, please call our office to schedule the follow-up appointment. Your physician recommends that you continue on your current medications as directed. Please refer to the Current Medication list given to you today. 

## 2013-01-11 NOTE — Progress Notes (Signed)
Spoke with Ryan [name] cma and she is aware pt on xarelto and will handle instructions for holding xarelto prior to 01-28-13 procedure

## 2013-01-11 NOTE — Assessment & Plan Note (Signed)
Well controlled.  Continue current medications and low sodium Dash type diet.    

## 2013-01-11 NOTE — Telephone Encounter (Signed)
Instructions have been faxed to Danie Binder at his facility he lives at.  [phone]

## 2013-01-11 NOTE — Telephone Encounter (Signed)
Anti coag letter sent to Dr Creta Levin

## 2013-01-11 NOTE — Progress Notes (Signed)
Patient ID: Ryan [name], male   DOB: [birth year], 62 y.o.   MRN: [id number] 62 yo referred by [facility] for edema and ? CHF. Patient has some memory issues. Mother is health care power of attorney in room with him Had been cared for in [place] area until recently. Inidcates CAD with "stents" done at [facility] 3-4 years ago. Also indicates being on coumadin in past. He was hospitalized 4/14 after a fall and intubated and cared for by our CCM team. In NSR at that time has had chronic LE edema with recent LE duplex showing no DVT but poor quality study.  Started on xarelto in July and has been given by [facility].  Recently admitted for idiopathic pancreatitis and is seeing GI for this.  CR .76 two weeks ago  Lipase 247     Reviewed records from [facility]  Stent to LAD in 2008  Restenosis in 2009  EF 30-35%   Echo done in our office 7/14 showed EF 55%     ROS: Denies fever, malais, weight loss, blurry vision, decreased visual acuity, cough, sputum, SOB, hemoptysis, pleuritic pain, palpitaitons, heartburn, abdominal pain, melena, lower extremity edema, claudication, or rash.  All other systems reviewed and negative  General: Affect appropriate Obese black male HEENT: normal Neck supple with no adenopathy JVP normal no bruits no thyromegaly Lungs clear with no wheezing and good diaphragmatic motion Heart:  S1/S2 no murmur, no rub, gallop or click PMI normal Abdomen: benighn, BS positve, no tenderness, no AAA no bruit.  No HSM or HJR Distal pulses intact with no bruits No edema Neuro non-focal Skin warm and dry No muscular weakness   Current Outpatient Prescriptions  Medication Sig Dispense Refill  . acetaminophen (TYLENOL) 325 MG tablet Take 2 tablets (650 mg total) by mouth every 6 (six) hours as needed.      . ALPRAZolam (XANAX) 0.5 MG tablet Take 1 tablet (0.5 mg total) by mouth 2 (two) times daily.  30 tablet  0  . Cholecalciferol (VITAMIN D) 2000 UNITS CAPS  Take 2,000 Units by mouth daily.      Marland Kitchen diltiazem (CARDIZEM CD) 180 MG 24 hr capsule Take 180 mg by mouth daily.      . DULoxetine (CYMBALTA) 20 MG capsule Take 20 mg by mouth daily.      . DULoxetine (CYMBALTA) 60 MG capsule Take 60 mg by mouth daily.      . Emollient (HYDROPHOR) OINT Apply 1 application topically 2 (two) times daily as needed (apply bilaterally from knees to feet for dry skin).      Marland Kitchen gabapentin (NEURONTIN) 300 MG capsule Take 300 mg by mouth at bedtime.      . hydroxypropyl methylcellulose (ISOPTO TEARS) 2.5 % ophthalmic solution Place 1 drop into the right eye 3 (three) times daily.      . insulin aspart (NOVOLOG) 100 UNIT/ML injection Inject 0-20 Units into the skin 3 (three) times daily with meals.  1 vial  12  . insulin glargine (LANTUS) 100 UNIT/ML injection Inject 25 Units into the skin at bedtime.       . insulin lispro (HUMALOG) 100 UNIT/ML injection Inject into the skin 3 (three) times daily before meals.      . lactulose (CHRONULAC) 10 GM/15ML solution Take 30 mLs by mouth 3 (three) times daily.       Marland Kitchen lactulose, encephalopathy, (GENERLAC) 10 GM/15ML SOLN Take 10 g by mouth daily.      Marland Kitchen losartan (COZAAR)  50 MG tablet Take 50 mg by mouth daily. Hold for sbp<110, repeat blood pressure in 1 hour      . Melatonin 3 MG TABS Take 3 mg by mouth at bedtime.      . metFORMIN (GLUCOPHAGE) 1000 MG tablet Take 1,000 mg by mouth 2 (two) times daily with a meal.      . nicotine (NICODERM CQ - DOSED IN MG/24 HOURS) 21 mg/24hr patch Place 1 patch onto the skin daily.      . ondansetron (ZOFRAN-ODT) 8 MG disintegrating tablet Take 8 mg by mouth every 8 (eight) hours as needed for nausea.      Marland Kitchen oxyCODONE (OXY IR/ROXICODONE) 5 MG immediate release tablet Take 3 tablets (15 mg total) by mouth every 3 (three) hours as needed for pain.  360 tablet  0  . oxyCODONE (ROXICODONE) 15 MG immediate release tablet Take one tablet by mouth every 4 hours as needed for pain. Hold for sedation or  RR<10/min or pinpoint pupils or blood pressure <110/60  180 tablet  0  . OxyCODONE HCl ER (OXYCONTIN) 30 MG T12A Take 30 mg by mouth every 12 (twelve) hours.  60 each  0  . pantoprazole (PROTONIX) 40 MG tablet Take 40 mg by mouth daily.      . polyethylene glycol (MIRALAX / GLYCOLAX) packet Take 17 g by mouth daily.      . Rivaroxaban (XARELTO) 20 MG TABS tablet Take 1 tablet (20 mg total) by mouth daily with supper.  30 tablet  0  . senna-docusate (SENOKOT-S) 8.6-50 MG per tablet Take 1 tablet by mouth 2 (two) times daily. Hold for diarrhea      . torsemide (DEMADEX) 20 MG tablet Take 20 mg by mouth daily.      . traZODone (DESYREL) 100 MG tablet Take 100 mg by mouth at bedtime.       No current facility-administered medications for this visit.    Allergies  Lisinopril and Morphine and related  Electrocardiogram:  9/10  SR rate 91  PVC  Flutter gone since July 2014   9/4  SR rate 94 PVC no flutter   Today NSR rate 92  PR 218  Normal with no flutter  Assessment and Plan

## 2013-01-11 NOTE — Addendum Note (Signed)
Addended by: Scherrie Bateman E on: 01/11/2013 03:32 PM   Modules accepted: Orders

## 2013-01-12 ENCOUNTER — Telehealth: Payer: Self-pay

## 2013-01-12 NOTE — Telephone Encounter (Signed)
Response received from Dr Creta Levin ok to hold xarelto 5 days prior to the procedure.  I called and faxed instructions and anti coag letter to Danie Binder at the pt's living facility

## 2013-01-27 NOTE — Anesthesia Preprocedure Evaluation (Addendum)
Anesthesia Evaluation  Patient identified by MRN, date of birth, ID band Patient awake    Reviewed: Allergy & Precautions, H&P , NPO status , Patient's Chart, lab work & pertinent test results  Airway Mallampati: II TM Distance: >3 FB Neck ROM: Full    Dental no notable dental hx. (+) Missing, Chipped and Poor Dentition   Pulmonary shortness of breath, at rest and lying, pneumonia -, COPDformer smoker,  breath sounds clear to auscultation  Pulmonary exam normal       Cardiovascular hypertension, Pt. on medications + angina + CAD, + Past MI, + Cardiac Stents (Cardiac stents X 2), + Peripheral Vascular Disease, +CHF and DVT + dysrhythmias Atrial Fibrillation Rhythm:Regular Rate:Normal     Neuro/Psych PSYCHIATRIC DISORDERS Anxiety Depression PTSD Neuromuscular disease negative neurological ROS  negative psych ROS   GI/Hepatic GERD-  Medicated,(+)     substance abuse  marijuana use, Hepatitis -, B and CRecurrent pancretitis   Endo/Other  diabetes, Type 2, Insulin Dependent and Oral Hypoglycemic AgentsMorbid obesity  Renal/GU negative Renal ROS  negative genitourinary   Musculoskeletal negative musculoskeletal ROS (+)   Abdominal (+) + obese,   Peds negative pediatric ROS (+)  Hematology negative hematology ROS (+)   Anesthesia Other Findings   Reproductive/Obstetrics negative OB ROS                          Anesthesia Physical Anesthesia Plan  ASA: III  Anesthesia Plan: MAC   Post-op Pain Management:    Induction: Intravenous  Airway Management Planned: Nasal Cannula  Additional Equipment:   Intra-op Plan:   Post-operative Plan:   Informed Consent: I have reviewed the patients History and Physical, chart, labs and discussed the procedure including the risks, benefits and alternatives for the proposed anesthesia with the patient or authorized representative who has indicated his/her  understanding and acceptance.   Dental advisory given  Plan Discussed with: CRNA  Anesthesia Plan Comments:         Anesthesia Quick Evaluation

## 2013-01-28 ENCOUNTER — Ambulatory Visit (HOSPITAL_COMMUNITY): Payer: PRIVATE HEALTH INSURANCE | Admitting: Anesthesiology

## 2013-01-28 ENCOUNTER — Encounter (HOSPITAL_COMMUNITY): Payer: Self-pay | Admitting: Anesthesiology

## 2013-01-28 ENCOUNTER — Encounter (HOSPITAL_COMMUNITY)
Admission: RE | Disposition: A | Discharge status: Skilled Nursing Facility | Payer: Self-pay | Source: Ambulatory Visit | Attending: Gastroenterology

## 2013-01-28 ENCOUNTER — Ambulatory Visit (HOSPITAL_COMMUNITY)
Admission: RE | Admit: 2013-01-28 | Discharge: 2013-01-28 | Disposition: A | Discharge status: Skilled Nursing Facility | Payer: PRIVATE HEALTH INSURANCE | Source: Ambulatory Visit | Attending: Gastroenterology | Admitting: Gastroenterology

## 2013-01-28 ENCOUNTER — Encounter (HOSPITAL_COMMUNITY): Payer: Self-pay | Admitting: *Deleted

## 2013-01-28 DIAGNOSIS — Z794 Long term (current) use of insulin: Secondary | ICD-10-CM | POA: Insufficient documentation

## 2013-01-28 DIAGNOSIS — K859 Acute pancreatitis without necrosis or infection, unspecified: Secondary | ICD-10-CM

## 2013-01-28 DIAGNOSIS — G8929 Other chronic pain: Secondary | ICD-10-CM | POA: Insufficient documentation

## 2013-01-28 DIAGNOSIS — R109 Unspecified abdominal pain: Secondary | ICD-10-CM | POA: Insufficient documentation

## 2013-01-28 DIAGNOSIS — Z9089 Acquired absence of other organs: Secondary | ICD-10-CM | POA: Insufficient documentation

## 2013-01-28 DIAGNOSIS — E119 Type 2 diabetes mellitus without complications: Secondary | ICD-10-CM | POA: Insufficient documentation

## 2013-01-28 DIAGNOSIS — Z86718 Personal history of other venous thrombosis and embolism: Secondary | ICD-10-CM | POA: Insufficient documentation

## 2013-01-28 DIAGNOSIS — K861 Other chronic pancreatitis: Secondary | ICD-10-CM

## 2013-01-28 HISTORY — DX: Umbilical hernia without obstruction or gangrene: K42.9

## 2013-01-28 HISTORY — DX: Polyneuropathy, unspecified: G62.9

## 2013-01-28 HISTORY — DX: Encephalopathy, unspecified: G93.40

## 2013-01-28 HISTORY — DX: Chronic obstructive pulmonary disease, unspecified: J44.9

## 2013-01-28 HISTORY — DX: Major depressive disorder, single episode, unspecified: F32.9

## 2013-01-28 HISTORY — DX: Spinal stenosis, site unspecified: M48.00

## 2013-01-28 HISTORY — PX: EUS: SHX5427

## 2013-01-28 HISTORY — DX: Depression, unspecified: F32.A

## 2013-01-28 SURGERY — UPPER ENDOSCOPIC ULTRASOUND (EUS) LINEAR
Anesthesia: Monitor Anesthesia Care

## 2013-01-28 MED ORDER — KETAMINE HCL 50 MG/ML IJ SOLN
INTRAMUSCULAR | Status: DC | PRN
  Administered 2013-01-28 (×2): 25 mg via INTRAMUSCULAR

## 2013-01-28 MED ORDER — LACTATED RINGERS IV SOLN
INTRAVENOUS | Status: DC
  Administered 2013-01-28: 07:00:00 via INTRAVENOUS

## 2013-01-28 MED ORDER — PROMETHAZINE HCL 25 MG/ML IJ SOLN: 6.2500 mg | INTRAMUSCULAR | Status: DC | PRN

## 2013-01-28 MED ORDER — EPHEDRINE SULFATE 50 MG/ML IJ SOLN
INTRAMUSCULAR | Status: DC | PRN
  Administered 2013-01-28 (×2): 5 mg via INTRAVENOUS

## 2013-01-28 MED ORDER — BUTAMBEN-TETRACAINE-BENZOCAINE 2-2-14 % EX AERO
INHALATION_SPRAY | CUTANEOUS | Status: DC | PRN
  Administered 2013-01-28: 2 via TOPICAL

## 2013-01-28 MED ORDER — PROPOFOL INFUSION 10 MG/ML OPTIME
INTRAVENOUS | Status: DC | PRN
  Administered 2013-01-28: 120 ug/kg/min via INTRAVENOUS

## 2013-01-28 MED ORDER — PHENYLEPHRINE HCL 10 MG/ML IJ SOLN
INTRAMUSCULAR | Status: DC | PRN
  Administered 2013-01-28 (×2): 40 ug via INTRAVENOUS

## 2013-01-28 NOTE — H&P (View-Only) (Signed)
     [facility] Daily Rounding Note 01/04/2013, 8:13 AM  SUBJECTIVE:       Still requiring Dilaudid.  Taking this as often as he can get it.  The abd pain is in the leftmid to lower quadrant.  Small BM yesterday.  Food, even solids, not causing pain but if he eats when he is having pain, the po's intensify the pain.   OBJECTIVE:         Vital signs in last 24 hours:    Temp:  [97.1 F (36.2 C)-98.6 F (37 C)] 98.6 F (37 C) (09/08 0530) Pulse Rate:  [72-87] 76 (09/08 0530) Resp:  [18-20] 20 (09/08 0530) BP: (83-128)/(53-79) 128/79 mmHg (09/08 0530) SpO2:  [93 %-100 %] 96 % (09/08 0530) Last BM Date: 01/01/13 General: chronically ill looking   Heart: RRR Chest: clear  Abdomen: soft, obese, hypoactive BS.  Tender in left mid and lower abdomen.  Extremities: + LE edema Neuro/Psych:  Oriented x 3.  No tremor.  No gross deficits.   Intake/Output from previous day: 09/07 0701 - 09/08 0700 In: 1780 [P.O.:1680] Out: 2200 [Urine:2200]    Lab Results: No results found for this basename: WBC, HGB, HCT, PLT,  in the last 72 hours BMET  Recent Labs  01/02/13 0501 01/04/13 0445  NA 132* 130*  K 4.1 4.8  CL 95* 94*  CO2 25 27  GLUCOSE 300* 189*  BUN 14 13  CREATININE 0.72 0.76  CALCIUM 10.2 10.4   Hepatitis Panel  Recent Labs  01/01/13 1300  HEPBSAG NEGATIVE  HCVAB Reactive*    ASSESMENT: * Acute pancreatitis.  Hx of same treated in [place].  This is first radiologically confirmed case here in [place] dating to 2009.  Long hx of recurrent abdominal pain with diagnoses of functional abdominal pain.  * S/p cholecystectomy ~ 2009.  * Chronic pain, chronic opiates for spine disease and neuropathic foot pain.  * Fatty liver  * Hep C ab +.  RNA viral load pending. .  * Hyponatremia.  * IDDM. Not well controlled.  * Hx right heart failure, cor pulmonale.  * Morbid obesity.  * Xarelto for hx 2011 bil DVT.  * Hx 08/2011 GEJx ulcer, healed on follow up EGD 12/2011. On  daily PPI.    PLAN: *  I went ahead and made ROV appt with Dr Marina Goodell for 10/6. *  Long term PPI   LOS: 5 days   Jennye Moccasin  01/04/2013, 8:13 AM Pager: [phone]   ________________________________________________________________________  [facility] MD note:  I personally examined the patient, reviewed the data and agree with the assessment and plan described above.  He is very focussed on his pain meds.  He has acute (possibly on chronic) mild pancreatitis.  He will probably require all of his usual, every day pain meds (which is A LOT) for his spinal, neuropathy pains PLUS whatever is needed to control the acute pancreatitis pains.  Longer term, I think evaluation with endoscopic ultrasound is a reasonable step to check for 1. Chronic pancreatitis changes and 2. Small CBD stones.  That should not be for at least many weeks after this acute episode resolves.  This can be arranged at time of his next ROV with Dr. Marina Goodell.   Rob Bunting, MD [facility] Pager [phone]

## 2013-01-28 NOTE — Interval H&P Note (Signed)
History and Physical Interval Note:  01/28/2013 7:23 AM  Ryan [name]  has presented today for surgery, with the diagnosis of Recurrent pancreatitis [577.1]  The various methods of treatment have been discussed with the patient and family. After consideration of risks, benefits and other options for treatment, the patient has consented to  Procedure(s): UPPER ENDOSCOPIC ULTRASOUND (EUS) LINEAR (N/A) as a surgical intervention .  The patient's history has been reviewed, patient examined, no change in status, stable for surgery.  I have reviewed the patient's chart and labs.  Questions were answered to the patient's satisfaction.     Rachael Fee

## 2013-01-28 NOTE — Transfer of Care (Signed)
Immediate Anesthesia Transfer of Care Note  Patient: Ryan [name]  Procedure(s) Performed: Procedure(s) (LRB): UPPER ENDOSCOPIC ULTRASOUND (EUS) LINEAR (N/A)  Patient Location: PACU  Anesthesia Type: MAC  Level of Consciousness: sedated, patient cooperative and responds to stimulation  Airway & Oxygen Therapy: Patient Spontanous Breathing and Patient connected to face mask oxgen  Post-op Assessment: Report given to PACU RN and Post -op Vital signs reviewed and stable  Post vital signs: Reviewed and stable  Complications: No apparent anesthesia complications

## 2013-01-28 NOTE — Op Note (Signed)
[facility] [address] [place], [address]   ENDOSCOPIC ULTRASOUND PROCEDURE REPORT  PATIENT: Ryan, [name]  MR#: [id number] BIRTHDATE: [birth year]  GENDER: Male ENDOSCOPIST: Rachael Fee, MD REFERRED BY:  Roxy Cedar, M.D. PROCEDURE DATE:  01/28/2013 PROCEDURE:   Upper EUS ASA CLASS:      Class III INDICATIONS:   chronic abdominal pain, recurrent acute pancreatitis; remote lap chole. MEDICATIONS: MAC sedation, administered by CRNA  DESCRIPTION OF PROCEDURE:   After the risks benefits and alternatives of the procedure were  explained, informed consent was obtained. The patient was then placed in the left, lateral, decubitus postion and IV sedation was administered. Throughout the procedure, the patients blood pressure, pulse and oxygen saturations were monitored continuously.  Under direct visualization, the EUS scope [id number]  endoscope was introduced through the mouth  and advanced to the second portion of the duodenum .  Water was used as necessary to provide an acoustic interface.  Upon completion of the imaging, water was removed and the patient was sent to the recovery room in satisfactory condition.   Endoscopic findings (limited views with radial echoendoscope): 1. Normal UGI tract  EUS findings: 1. Pancreatic parenchyma was diffusely abnormal with hyperechoic foci, strands, honeycombing, lobular overall architecture to the gland.  There were no discrete masses. 2. Main pancraetic duct was slightly dilated in head, but tapered normally throughout neck, body and tail. 3. No peripancreatic adenopathy. 4. CBD was slightly dilated (8mm), contained no stones or soft tissue masses. 5. Gallbladder surgically absent 6. Limited views of liver, spleen, portal and splenic vessels were all normal.  Impression: Changes consisted with chronic pancreatitis.  This probably contributes to his chronic pains and can cause acute exacerbations.  You  should restart your xarelto today. Follow up as needed with Dr. Marina Goodell.   _______________________________ eSignedRachael Fee, MD 01/28/2013 8:08 AM     PATIENT NAME:  Ryan, [name] MR#: [id number]

## 2013-01-29 ENCOUNTER — Encounter (HOSPITAL_COMMUNITY): Payer: Self-pay | Admitting: Gastroenterology

## 2013-01-29 NOTE — Anesthesia Postprocedure Evaluation (Addendum)
  Anesthesia Post-op Note  Patient: Ryan [name]  Procedure(s) Performed: Procedure(s) (LRB): UPPER ENDOSCOPIC ULTRASOUND (EUS) LINEAR (N/A)  Patient Location: PACU  Anesthesia Type: MAC  Level of Consciousness: awake and alert   Airway and Oxygen Therapy: Patient Spontanous Breathing  Post-op Pain: mild  Post-op Assessment: Post-op Vital signs reviewed, Patient's Cardiovascular Status Stable, Respiratory Function Stable, Patent Airway and No signs of Nausea or vomiting  Last Vitals:  Filed Vitals:   01/28/13 0836  BP: 110/73  Pulse:   Temp:   Resp: 16    Post-op Vital Signs: stable   Complications: No apparent anesthesia complications

## 2013-02-01 ENCOUNTER — Encounter: Payer: Self-pay | Admitting: Internal Medicine

## 2013-02-01 ENCOUNTER — Ambulatory Visit (INDEPENDENT_AMBULATORY_CARE_PROVIDER_SITE_OTHER): Payer: PRIVATE HEALTH INSURANCE | Admitting: Internal Medicine

## 2013-02-01 VITALS — BP 100/58 | HR 62 | Ht 77.0 in | Wt 324.0 lb

## 2013-02-01 DIAGNOSIS — K859 Acute pancreatitis without necrosis or infection, unspecified: Secondary | ICD-10-CM

## 2013-02-01 DIAGNOSIS — K5900 Constipation, unspecified: Secondary | ICD-10-CM

## 2013-02-01 DIAGNOSIS — R1084 Generalized abdominal pain: Secondary | ICD-10-CM

## 2013-02-01 DIAGNOSIS — K219 Gastro-esophageal reflux disease without esophagitis: Secondary | ICD-10-CM

## 2013-02-01 NOTE — Patient Instructions (Addendum)
Please follow up with Dr. Perry as needed 

## 2013-02-01 NOTE — Progress Notes (Signed)
HISTORY OF PRESENT ILLNESS:  Ryan [name] is a 62 y.o. male with MULTIPLE SIGNIFICANT medical problems as listed below. He has been evaluated in the hospital on multiple occasions for recurrent abdominal pain with nausea and vomiting. Symptoms have been found to be secondary to varying things including chronic narcotic related issues and at times pancreatitis. He is status post cholecystectomy. He was hospitalized in early September with pancreatitis. He was seen in the office by the physician assistant in 01/07/2013. Subsequent endoscopic ultrasound was performed by Dr. Christella Hartigan. Ultrasonographic changes of chronic pancreatitis noted. No other complications. Presents today for followup. He complains of chronic pain in multiple areas including his abdomen. Looks quite well. He is now a nursing home-type facility. He tells me that he was discharged from a pain clinic because he missed an appointment. For constipation he takes Chronulac or GlycoLax. For GERD pantoprazole. His GI review of systems is entirely negative except for abdominal pain  REVIEW OF SYSTEMS:  All non-GI ROS negative except for back pain  Past Medical History  Diagnosis Date  . Hypertension   . DVT (deep venous thrombosis) ~ 2011    BLE  . 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 Hyperlipidemia   . CAD (coronary atherosclerotic disease)     s/p stents x2  . Small bowel obstruction   . Pancreatitis   . Anxiety   . GERD (gastroesophageal reflux disease)   . CHF (congestive heart failure)   . Myocardial infarction ~ 2009  . Anginal pain   . Pneumonia ~ 2012  . Shortness of breath     "lying down & w/exertion" (03/04/2012)  . Chronic lower back pain   . PTSD (post-traumatic stress disorder)   . Hernia   . Narcotic addiction   . DDD (degenerative disc disease)     multilevel   . Neuropathy   . Spinal stenosis     with chronic lower back pain and lower extremity pain  . Encephalopathy 07-2012   and sepsis  . Depression   . 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 Hepatitis C 01/02/2013  . Dysrhythmia     atrial flutter  . COPD (chronic obstructive pulmonary disease)   . Periumbilical hernia   . Type II diabetes mellitus   . Pancreatitis   . GERD (gastroesophageal reflux disease)     Past Surgical History  Procedure Laterality Date  . Esophagogastroduodenoscopy  09/22/2011    Procedure: ESOPHAGOGASTRODUODENOSCOPY (EGD);  Surgeon: Charna Elizabeth, MD;  Location: [facility] ENDOSCOPY;  Service: Endoscopy;  Laterality: N/A;  . Esophagogastroduodenoscopy  01/05/2012    Procedure: ESOPHAGOGASTRODUODENOSCOPY (EGD);  Surgeon: Rachael Fee, MD;  Location: [facility] ENDOSCOPY;  Service: Endoscopy;  Laterality: N/A;  . Cholecystectomy  ~ 2009  . Spinal fusion  ~ 2011    lumbar spine.   . Coronary angioplasty with stent placement  ~ 2009`    "1 + ! (after 1st one failed)" (03/04/2012)  . Tonsillectomy and adenoidectomy      tonsils and adenoids  . Eus N/A 01/28/2013    Procedure: UPPER ENDOSCOPIC ULTRASOUND (EUS) LINEAR;  Surgeon: Rachael Fee, MD;  Location: [facility] ENDOSCOPY;  Service: Endoscopy;  Laterality: N/A;    Social History MANSOOR HILLYARD  reports that he quit smoking about 7 months ago. His smoking use included Cigarettes. He has a 19 pack-year smoking history. He  has never used smokeless tobacco. He reports that  drinks alcohol. He reports that he uses illicit drugs (Marijuana).  family history includes Diabetes in his father. There is no history of Colon cancer.  Allergies  Allergen Reactions  . Lisinopril Swelling and Rash  . Morphine And Related Swelling and Rash       PHYSICAL EXAMINATION: Vital signs: BP 100/58  Pulse 62  Ht 6\' 5"  (1.956 m)  Wt 324 lb (146.965 kg)  BMI 38.41 kg/m2 General: Chronically ill appearing, morbidly obese, well-nourished, no acute distress. Sitting in wheelchair HEENT: Sclerae are anicteric,  conjunctiva pink. Oral mucosa intact Lungs: Clear Heart: Regular Abdomen: soft, obese nontender, nondistended, no obvious ascites, no peritoneal signs, normal bowel sounds. No organomegaly. Extremities: No edema Psychiatric: alert and oriented x3. Cooperative   ASSESSMENT:  #1. Chronic pain syndrome #2. Chronic pancreatitis with occasional flares. No complicated features on recent EUS #3. Multiple medical problems #4. GERD #5. Functional constipation   PLAN:  #1. Low-fat diet #2. Pain clinic to manage chronic pain issues. #3. Continue PPI #4. Continue lactulose and MiraLax as needed for constipation #5. GI followup when necessary

## 2013-02-02 ENCOUNTER — Telehealth: Payer: Self-pay | Admitting: Internal Medicine

## 2013-02-02 NOTE — Telephone Encounter (Signed)
Left message to call back  

## 2013-02-02 NOTE — Telephone Encounter (Signed)
Pt has been seen by [facility] in the past. Vaniqua to call and schedule an appt for the pt.

## 2013-02-03 ENCOUNTER — Other Ambulatory Visit: Payer: Self-pay | Admitting: *Deleted

## 2013-02-03 DIAGNOSIS — F329 Major depressive disorder, single episode, unspecified: Secondary | ICD-10-CM

## 2013-02-03 MED ORDER — ALPRAZOLAM 0.5 MG PO TABS: ORAL_TABLET | ORAL | Status: DC

## 2013-02-05 ENCOUNTER — Encounter (INDEPENDENT_AMBULATORY_CARE_PROVIDER_SITE_OTHER): Payer: PRIVATE HEALTH INSURANCE | Admitting: Ophthalmology

## 2013-02-05 DIAGNOSIS — E1139 Type 2 diabetes mellitus with other diabetic ophthalmic complication: Secondary | ICD-10-CM

## 2013-02-05 DIAGNOSIS — H35039 Hypertensive retinopathy, unspecified eye: Secondary | ICD-10-CM

## 2013-02-05 DIAGNOSIS — E11319 Type 2 diabetes mellitus with unspecified diabetic retinopathy without macular edema: Secondary | ICD-10-CM

## 2013-02-05 DIAGNOSIS — H43819 Vitreous degeneration, unspecified eye: Secondary | ICD-10-CM

## 2013-02-05 DIAGNOSIS — H2510 Age-related nuclear cataract, unspecified eye: Secondary | ICD-10-CM

## 2013-02-05 DIAGNOSIS — I10 Essential (primary) hypertension: Secondary | ICD-10-CM

## 2013-02-11 ENCOUNTER — Other Ambulatory Visit: Payer: Self-pay | Admitting: *Deleted

## 2013-02-11 MED ORDER — OXYCODONE HCL 15 MG PO TABS: ORAL_TABLET | ORAL | Status: DC

## 2013-02-15 ENCOUNTER — Other Ambulatory Visit: Payer: Self-pay | Admitting: *Deleted

## 2013-02-15 MED ORDER — OXYCODONE HCL ER 30 MG PO T12A: 30.0000 mg | EXTENDED_RELEASE_TABLET | Freq: Two times a day (BID) | ORAL | Status: DC

## 2013-02-18 ENCOUNTER — Other Ambulatory Visit: Payer: Self-pay | Admitting: *Deleted

## 2013-02-18 MED ORDER — OXYCODONE HCL ER 30 MG PO T12A: 30.0000 mg | EXTENDED_RELEASE_TABLET | Freq: Two times a day (BID) | ORAL | Status: DC

## 2013-02-25 ENCOUNTER — Non-Acute Institutional Stay (SKILLED_NURSING_FACILITY): Payer: PRIVATE HEALTH INSURANCE | Admitting: Internal Medicine

## 2013-02-25 ENCOUNTER — Other Ambulatory Visit: Payer: Self-pay

## 2013-02-25 DIAGNOSIS — G909 Disorder of the autonomic nervous system, unspecified: Secondary | ICD-10-CM

## 2013-02-25 DIAGNOSIS — F329 Major depressive disorder, single episode, unspecified: Secondary | ICD-10-CM

## 2013-02-25 DIAGNOSIS — Z7901 Long term (current) use of anticoagulants: Secondary | ICD-10-CM | POA: Insufficient documentation

## 2013-02-25 DIAGNOSIS — F341 Dysthymic disorder: Secondary | ICD-10-CM

## 2013-02-25 DIAGNOSIS — E1149 Type 2 diabetes mellitus with other diabetic neurological complication: Secondary | ICD-10-CM

## 2013-02-25 DIAGNOSIS — I4891 Unspecified atrial fibrillation: Secondary | ICD-10-CM

## 2013-02-25 DIAGNOSIS — G894 Chronic pain syndrome: Secondary | ICD-10-CM | POA: Insufficient documentation

## 2013-02-25 DIAGNOSIS — N400 Enlarged prostate without lower urinary tract symptoms: Secondary | ICD-10-CM | POA: Insufficient documentation

## 2013-02-25 DIAGNOSIS — E1143 Type 2 diabetes mellitus with diabetic autonomic (poly)neuropathy: Secondary | ICD-10-CM

## 2013-02-25 DIAGNOSIS — E1165 Type 2 diabetes mellitus with hyperglycemia: Secondary | ICD-10-CM

## 2013-02-25 MED ORDER — ALPRAZOLAM 0.5 MG PO TABS: ORAL_TABLET | ORAL | Status: DC

## 2013-02-25 NOTE — Progress Notes (Signed)
Patient ID: Ryan [name], male   DOB: 12/08/1950, 62 y.o.   MRN: [id number]  [facility]  Chief complaint- medical management of chronic illness  Allergies reviewed  Code status- full code  HPI 62 y/o male patient is a long term care resident in the facility.he complaints of pain in his leg and lower back not being under control. His abdominal pain has resolved. He also has noticed that he has been dribbling post urination and has urinary incontinence. He denies any dysuria or hematuria. Denies any suprapubic or flank pain. He also mentions that he remains anxious and current medication is not helping him. His walking and strength improving with therapy. Pending pain clinic appointment with Dr Vear Clock  Review of Systems   Constitutional: Negative for fever, chills and diaphoresis.   HENT: Negative for congestion.    Eyes: Negative for blurred vision.   Respiratory: Negative for cough and shortness of breath.    Cardiovascular: Negative for chest pain and claudication. Negative for leg swelling Gastrointestinal: Negative for heartburn, nausea, vomiting,and constipation.  Genitourinary: Negative for dysuria. See hpi  Musculoskeletal: Positive for joint pain.  Skin: Negative for rash.   Neurological: Negative for dizziness, sensory change and headaches.   Psychiatric/Behavioral: Negative for depression. He is craving for cigarettes and is asking people/ visitor for cigarettes    Past Medical History  Diagnosis Date  . Hypertension   . DVT (deep venous thrombosis) ~ 2011    BLE  . 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 Hyperlipidemia   . CAD (coronary atherosclerotic disease)     s/p stents x2  . Small bowel obstruction   . Pancreatitis   . Anxiety   . GERD (gastroesophageal reflux disease)   . CHF (congestive heart failure)   . Myocardial infarction ~ 2009  . Anginal pain   . Pneumonia ~ 2012  . Shortness of breath     "lying down &  w/exertion" (03/04/2012)  . Chronic lower back pain   . PTSD (post-traumatic stress disorder)   . Hernia   . Narcotic addiction   . DDD (degenerative disc disease)     multilevel   . Neuropathy   . Spinal stenosis     with chronic lower back pain and lower extremity pain  . Encephalopathy 07-2012    and sepsis  . Depression   . 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 Hepatitis C 01/02/2013  . Dysrhythmia     atrial flutter  . COPD (chronic obstructive pulmonary disease)   . Periumbilical hernia   . Type II diabetes mellitus   . Pancreatitis   . GERD (gastroesophageal reflux disease)    Past Surgical History  Procedure Laterality Date  . Esophagogastroduodenoscopy  09/22/2011    Procedure: ESOPHAGOGASTRODUODENOSCOPY (EGD);  Surgeon: Charna Elizabeth, MD;  Location: [facility] ENDOSCOPY;  Service: Endoscopy;  Laterality: N/A;  . Esophagogastroduodenoscopy  01/05/2012    Procedure: ESOPHAGOGASTRODUODENOSCOPY (EGD);  Surgeon: Rachael Fee, MD;  Location: [facility] ENDOSCOPY;  Service: Endoscopy;  Laterality: N/A;  . Cholecystectomy  ~ 2009  . Spinal fusion  ~ 2011    lumbar spine.   . Coronary angioplasty with stent placement  ~ 2009`    "1 + ! (after 1st one failed)" (03/04/2012)  . Tonsillectomy and adenoidectomy      tonsils and adenoids  . Eus N/A 01/28/2013  Procedure: UPPER ENDOSCOPIC ULTRASOUND (EUS) LINEAR;  Surgeon: Rachael Fee, MD;  Location: [facility] ENDOSCOPY;  Service: Endoscopy;  Laterality: N/A;   Medication reviewed. See MAR   Physical Exam   VSS  Constitutional: He is oriented to person, place, and time. He appears well-developed and well-nourished. No distress.   HENT:   Head: Normocephalic and atraumatic.   Nose: Nose normal.   Mouth/Throat: Oropharynx is clear and moist.   Eyes: Conjunctivae are normal. Pupils are equal, round, and reactive to light.   Neck: Normal range of motion. Neck supple. No JVD present. No  thyromegaly present.   Cardiovascular: Normal rate and regular rhythm.  no wheeze. Basilar crackles bilaterally Pulmonary/Chest: Effort normal and breath sounds normal. No respiratory distress. He has no wheezes. He exhibits no tenderness.   Abdominal: Soft. Bowel sounds are normal. No abdominal tenderness, no guarding or rigidity Musculoskeletal: Normal range of motion.  Weakness in lower extremities, propels self in wheelchair,  Trace edema both legs, chronic vascular changes in both leg  Lymphadenopathy:   He has no cervical adenopathy.  Neurological: He is alert and oriented to person, place, and time. No cranial nerve deficit.   Skin: Skin is warm and dry. He is not diaphoretic.  Psychiatric: He has a normal mood and affect. His behavior is normal.   Labs reviewed 01/06/13 na 140, k 4.2, glu 174, bun 5, cr 0.7, alb 3.2, lft wnl, amylase 55, lipase 265 01/05/13 a1c 10  01/06/13 cxr- mild pulmonary vascular congestion. No effusion, patchy basilar atelectasis vs interstitial pneumonitis  Assessment/Plan  Chronic pain- in setting of spinal stenosis. awaiting appointment with pain management clinic. Will contnue oxycodone ER 30 mg bid and oxycodone 15 mg q4 h prn  Peripheral neuropathy- on neurontin 300 mg daily, with his pain worsening, will increase neurontin to 300 mg bid for now and reassess  Anxiety- will change his xanax to 0.5 mg tid for now and monitor  BPH- has possible outflow obstruction from enlarged prostate. Will have him on flomax 0.4 mg daily and reassess  Dm type 2- continue lantus and SSI. Monitor cbg and a1c  Afib- rate currently controlled with cardizem, continue this and xarelto for secondary stroke prophylaxis  chf- breathing stable, lungs have basilar crackles and has trace edema on exam. Continue demadex for now  Depression- continue xanax and cymbalta with trazodone  HTN- stable, continue losartan and monitor bp readings  GERD- continue protonix for  now

## 2013-03-12 ENCOUNTER — Other Ambulatory Visit: Payer: Self-pay | Admitting: *Deleted

## 2013-03-12 MED ORDER — OXYCODONE HCL 10 MG PO TABS: ORAL_TABLET | ORAL | Status: DC

## 2013-03-19 ENCOUNTER — Other Ambulatory Visit: Payer: Self-pay | Admitting: *Deleted

## 2013-03-19 MED ORDER — OXYCODONE HCL ER 30 MG PO T12A: EXTENDED_RELEASE_TABLET | ORAL | Status: DC

## 2013-03-19 NOTE — Telephone Encounter (Signed)
rx filled per protocol  

## 2013-03-23 ENCOUNTER — Other Ambulatory Visit: Payer: Self-pay

## 2013-03-23 MED ORDER — LORAZEPAM 1 MG PO TABS: ORAL_TABLET | ORAL | Status: DC

## 2013-04-08 ENCOUNTER — Non-Acute Institutional Stay (SKILLED_NURSING_FACILITY): Payer: PRIVATE HEALTH INSURANCE | Admitting: Internal Medicine

## 2013-04-08 ENCOUNTER — Other Ambulatory Visit: Payer: Self-pay | Admitting: *Deleted

## 2013-04-08 DIAGNOSIS — N400 Enlarged prostate without lower urinary tract symptoms: Secondary | ICD-10-CM

## 2013-04-08 DIAGNOSIS — F341 Dysthymic disorder: Secondary | ICD-10-CM

## 2013-04-08 DIAGNOSIS — E1143 Type 2 diabetes mellitus with diabetic autonomic (poly)neuropathy: Secondary | ICD-10-CM

## 2013-04-08 DIAGNOSIS — G909 Disorder of the autonomic nervous system, unspecified: Secondary | ICD-10-CM

## 2013-04-08 DIAGNOSIS — E1149 Type 2 diabetes mellitus with other diabetic neurological complication: Secondary | ICD-10-CM

## 2013-04-08 DIAGNOSIS — K08109 Complete loss of teeth, unspecified cause, unspecified class: Secondary | ICD-10-CM

## 2013-04-08 DIAGNOSIS — F329 Major depressive disorder, single episode, unspecified: Secondary | ICD-10-CM

## 2013-04-08 DIAGNOSIS — K08409 Partial loss of teeth, unspecified cause, unspecified class: Secondary | ICD-10-CM | POA: Insufficient documentation

## 2013-04-08 DIAGNOSIS — M48062 Spinal stenosis, lumbar region with neurogenic claudication: Secondary | ICD-10-CM

## 2013-04-08 DIAGNOSIS — E1165 Type 2 diabetes mellitus with hyperglycemia: Secondary | ICD-10-CM

## 2013-04-08 DIAGNOSIS — K219 Gastro-esophageal reflux disease without esophagitis: Secondary | ICD-10-CM

## 2013-04-08 DIAGNOSIS — I4891 Unspecified atrial fibrillation: Secondary | ICD-10-CM

## 2013-04-08 NOTE — Progress Notes (Signed)
Patient ID: Ryan [name], male   DOB: 1950-06-16, 62 y.o.   MRN: [id number]  [facility]  Chief complaint- medical management of chronic illness  Allergies reviewed  Code status- full code  HPI 62 y/o male patient is a long term care resident in the facility.he complaints of pain in his leg and lower back not being under control with current regimen. He also complaints of pin and needle sensation with some numbness in his arms. He mentions that oxycodone have worked better for him than oxyER in the past. He would like the ER stopped. His abdominal pain has resolved. No other complaints. Of note recently had tooth extracted and pain under control  Review of Systems   Constitutional: Negative for fever, chills and diaphoresis.   HENT: Negative for congestion.    Eyes: Negative for blurred vision.   Respiratory: Negative for cough and shortness of breath.    Cardiovascular: Negative for chest pain and claudication. Negative for leg swelling Gastrointestinal: Negative for heartburn, nausea, vomiting,and constipation.   Genitourinary: Negative for dysuria. See hpi  Musculoskeletal: Positive for joint pain.  Skin: Negative for rash.   Neurological: Negative for dizziness, sensory change and headaches.   Psychiatric/Behavioral: Negative for depression. He is craving for cigarettes and is asking people/ visitor for cigarettes  Past Medical History  Diagnosis Date  . Hypertension   . DVT (deep venous thrombosis) ~ 2011    BLE  . 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 Hyperlipidemia   . CAD (coronary atherosclerotic disease)     s/p stents x2  . Small bowel obstruction   . Pancreatitis   . Anxiety   . GERD (gastroesophageal reflux disease)   . CHF (congestive heart failure)   . Myocardial infarction ~ 2009  . Anginal pain   . Pneumonia ~ 2012  . Shortness of breath     "lying down & w/exertion" (03/04/2012)  . Chronic lower back pain   . PTSD  (post-traumatic stress disorder)   . Hernia   . Narcotic addiction   . DDD (degenerative disc disease)     multilevel   . Neuropathy   . Spinal stenosis     with chronic lower back pain and lower extremity pain  . Encephalopathy 07-2012    and sepsis  . Depression   . 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 Hepatitis C 01/02/2013  . Dysrhythmia     atrial flutter  . COPD (chronic obstructive pulmonary disease)   . Periumbilical hernia   . Type II diabetes mellitus   . Pancreatitis   . GERD (gastroesophageal reflux disease)    Past Surgical History  Procedure Laterality Date  . Esophagogastroduodenoscopy  09/22/2011    Procedure: ESOPHAGOGASTRODUODENOSCOPY (EGD);  Surgeon: Charna Elizabeth, MD;  Location[facility] ENDOSCOPY;  Service: Endoscopy;  Laterality: N/A;  . Esophagogastroduodenoscopy  01/05/2012    Procedure: ESOPHAGOGASTRODUODENOSCOPY (EGD);  Surgeon: Rachael Fee, MD;  Location: [facility] ENDOSCOPY;  Service: Endoscopy;  Laterality: N/A;  . Cholecystectomy  ~ 2009  . Spinal fusion  ~ 2011    lumbar spine.   . Coronary angioplasty with stent placement  ~ 2009`    "1 + ! (after 1st one failed)" (03/04/2012)  . Tonsillectomy and adenoidectomy      tonsils and adenoids  . Eus N/A 01/28/2013    Procedure: UPPER ENDOSCOPIC  ULTRASOUND (EUS) LINEAR;  Surgeon: Rachael Fee, MD;  Location: [facility] ENDOSCOPY;  Service: Endoscopy;  Laterality: N/A;   Medication reviewed. See  [facility]  Physical exam BP 135/60  Pulse 70  Temp(Src) 98 F (36.7 C)  Resp 20  SpO2 96%  Constitutional: He is oriented to person, place, and time. He appears well-developed and well-nourished. No distress.   HENT:   Head: Normocephalic and atraumatic.   Nose: Nose normal.   Mouth/Throat: Oropharynx is clear and moist.   Eyes: Conjunctivae are normal. Pupils are equal, round, and reactive to light.   Neck: Normal range of motion. Neck supple. No JVD present. No  thyromegaly present.   Cardiovascular: Normal rate and regular rhythm.  no wheeze. Basilar crackles bilaterally Pulmonary/Chest: Effort normal and breath sounds normal. No respiratory distress. He has no wheezes. He exhibits no tenderness.   Abdominal: Soft. Bowel sounds are normal. No abdominal tenderness, no guarding or rigidity Musculoskeletal: limited range of motion in setting of chronic pain and body habitus Weakness in lower extremities, propels self in wheelchair,  Trace edema both legs, chronic vascular changes in both leg  Lymphadenopathy:  He has no cervical adenopathy.  Neurological: He is alert and oriented to person, place, and time. No cranial nerve deficit.   Skin: Skin is warm and dry. He is not diaphoretic.  Psychiatric: He has a normal mood and affect. His behavior is normal.   Labs reviewed 01/06/13 na 140, k 4.2, glu 174, bun 5, cr 0.7, alb 3.2, lft wnl, amylase 55, lipase 265 01/05/13 a1c 10  01/06/13 cxr- mild pulmonary vascular congestion. No effusion, patchy basilar atelectasis vs interstitial pneumonitis  Assessment/Plan  Chronic pain- in setting of spinal stenosis. Will taper him off his oxycodone ER to 20 mg bid for 3 days and then 10 mg bid for 3 days followed by 10 mg daily for 2 days and stop. In the meanwhile will change his oxycodone to 30 mg q4h prn.   Peripheral neuropathy- on neurontin 300 mg bid at present. With persisting and worsening changes, will increase his neurontin to 300 mg tid. Monitor for sedation   Afib- rate currently controlled with cardizem, continue this and xarelto for secondary stroke prophylaxis  Anxiety- xanax has been helpful, no dose changes this visit, monitor  Dm type 2- a1c 01/27/13 9.7. Monitor cbg.continue lantus and SSI.   BPH- continue flomax, encourage hydration  chf- breathing stable, continue demadex for now  GERD- continue protonix for now  Teeth extraction- current pain regimen has it under control. Will have him on  peridex 5 ml bid for a week for antiseptic purpose and maintain oral hygiene  Spent more than 50 minutes in patient care, 25 minutes on counselling.

## 2013-04-12 ENCOUNTER — Other Ambulatory Visit: Payer: Self-pay | Admitting: *Deleted

## 2013-04-12 MED ORDER — OXYCODONE HCL ER 10 MG PO T12A: EXTENDED_RELEASE_TABLET | ORAL | Status: DC

## 2013-04-15 ENCOUNTER — Other Ambulatory Visit: Payer: Self-pay | Admitting: *Deleted

## 2013-04-15 MED ORDER — OXYCODONE HCL 30 MG PO TABS: ORAL_TABLET | ORAL | Status: DC

## 2013-05-04 ENCOUNTER — Other Ambulatory Visit: Payer: Self-pay | Admitting: *Deleted

## 2013-05-04 MED ORDER — AMBULATORY NON FORMULARY MEDICATION: Status: DC

## 2013-05-06 ENCOUNTER — Other Ambulatory Visit: Payer: Self-pay | Admitting: *Deleted

## 2013-05-17 ENCOUNTER — Other Ambulatory Visit: Payer: Self-pay | Admitting: *Deleted

## 2013-05-17 MED ORDER — OXYCODONE HCL 30 MG PO TABS: ORAL_TABLET | ORAL | Status: DC

## 2013-05-19 ENCOUNTER — Other Ambulatory Visit: Payer: Self-pay | Admitting: *Deleted

## 2013-05-19 MED ORDER — LORAZEPAM 2 MG/ML PO CONC: ORAL | Status: DC

## 2013-05-28 ENCOUNTER — Encounter: Payer: Self-pay | Admitting: Internal Medicine

## 2013-05-28 ENCOUNTER — Non-Acute Institutional Stay (SKILLED_NURSING_FACILITY): Payer: PRIVATE HEALTH INSURANCE | Admitting: Internal Medicine

## 2013-05-28 DIAGNOSIS — F329 Major depressive disorder, single episode, unspecified: Secondary | ICD-10-CM

## 2013-05-28 DIAGNOSIS — F419 Anxiety disorder, unspecified: Secondary | ICD-10-CM

## 2013-05-28 DIAGNOSIS — N400 Enlarged prostate without lower urinary tract symptoms: Secondary | ICD-10-CM

## 2013-05-28 DIAGNOSIS — G909 Disorder of the autonomic nervous system, unspecified: Secondary | ICD-10-CM

## 2013-05-28 DIAGNOSIS — I4891 Unspecified atrial fibrillation: Secondary | ICD-10-CM

## 2013-05-28 DIAGNOSIS — K219 Gastro-esophageal reflux disease without esophagitis: Secondary | ICD-10-CM

## 2013-05-28 DIAGNOSIS — IMO0002 Reserved for concepts with insufficient information to code with codable children: Secondary | ICD-10-CM

## 2013-05-28 DIAGNOSIS — I10 Essential (primary) hypertension: Secondary | ICD-10-CM

## 2013-05-28 DIAGNOSIS — E1149 Type 2 diabetes mellitus with other diabetic neurological complication: Secondary | ICD-10-CM

## 2013-05-28 DIAGNOSIS — E1143 Type 2 diabetes mellitus with diabetic autonomic (poly)neuropathy: Secondary | ICD-10-CM

## 2013-05-28 DIAGNOSIS — E1165 Type 2 diabetes mellitus with hyperglycemia: Secondary | ICD-10-CM

## 2013-05-28 DIAGNOSIS — E118 Type 2 diabetes mellitus with unspecified complications: Secondary | ICD-10-CM

## 2013-05-28 DIAGNOSIS — F341 Dysthymic disorder: Secondary | ICD-10-CM

## 2013-05-28 DIAGNOSIS — M48062 Spinal stenosis, lumbar region with neurogenic claudication: Secondary | ICD-10-CM

## 2013-05-28 NOTE — Progress Notes (Signed)
Patient ID: Ryan RichmondMichael A [name], male   DOB: 10-17-1950, 63 y.o.   MRN: [id number]    [facility]  Chief complaint- medical management of chronic illness  Allergies reviewed  Code status- full code  HPI 63 y/o male patient is a here for long term care. Staff report him continuing to smoke despite of this being a non smoking facility and asked not to do so. He has been given a 30 day discharge notice and looking into assisted living. Patient mentions he would like to continue to stay here but also mentions he would need to smoke. he complaints of pain in his leg and lower back not being under control with current regimen and request oxycodone 15 mg additional for breakthrough pain. He has been having nightmares recently about being in the war and this stresses him.   Review of Systems   Constitutional: Negative for fever, chills and diaphoresis.   HENT: Negative for congestion.    Eyes: Negative for blurred vision.   Respiratory: Negative for cough and shortness of breath.    Cardiovascular: Negative for chest pain and claudication. Negative for leg swelling Gastrointestinal: Negative for heartburn, nausea, vomiting,and constipation.   Genitourinary: Negative for dysuria. See hpi  Musculoskeletal: Positive for joint pain.  Skin: Negative for rash.   Neurological: Negative for dizziness, sensory change and headaches.   Psychiatric/Behavioral: Negative for depression.   Current Outpatient Prescriptions on File Prior to Visit  Medication Sig Dispense Refill  . acetaminophen (TYLENOL) 325 MG tablet Take 2 tablets (650 mg total) by mouth every 6 (six) hours as needed.      . ALPRAZolam (XANAX) 0.5 MG tablet Take one tablet by mouth three times daily for anxiety  90 tablet  5  . Cholecalciferol (VITAMIN D) 2000 UNITS CAPS Take 2,000 Units by mouth daily.      Marland Kitchen. diltiazem (CARDIZEM CD) 180 MG 24 hr capsule Take 180 mg by mouth daily.      . DULoxetine (CYMBALTA) 20 MG capsule Take 20  mg by mouth daily.      . DULoxetine (CYMBALTA) 60 MG capsule Take 60 mg by mouth daily.      Marland Kitchen. gabapentin (NEURONTIN) 300 MG capsule Take 400 mg by mouth 3 (three) times daily.       . hydroxypropyl methylcellulose (ISOPTO TEARS) 2.5 % ophthalmic solution Place 1 drop into the right eye 3 (three) times daily.      . insulin aspart (NOVOLOG) 100 UNIT/ML injection Inject 8 Units into the skin 3 (three) times daily after meals.      . insulin glargine (LANTUS) 100 UNIT/ML injection Inject 30 Units into the skin at bedtime.       Marland Kitchen. lactulose (CHRONULAC) 10 GM/15ML solution Take 30 mLs by mouth 3 (three) times daily.       Marland Kitchen. LORazepam (ATIVAN) 1 MG tablet 1 by mouth 30 minutes prior to dental appointment for sedation  1 tablet  5  . losartan (COZAAR) 50 MG tablet Take 50 mg by mouth daily. Hold for sbp<110, repeat blood pressure in 1 hour      . metFORMIN (GLUCOPHAGE) 1000 MG tablet Take 1,000 mg by mouth 2 (two) times daily with a meal.      . ondansetron (ZOFRAN-ODT) 8 MG disintegrating tablet Take 8 mg by mouth every 8 (eight) hours as needed for nausea.      Marland Kitchen. oxycodone (ROXICODONE) 30 MG immediate release tablet Take one tablet by mouth every 4 hours  as needed for pain. Hold for sedation and RR < 10/min  180 tablet  0  . pantoprazole (PROTONIX) 40 MG tablet Take 40 mg by mouth daily.      . polyethylene glycol (MIRALAX / GLYCOLAX) packet Take 17 g by mouth daily.      . Rivaroxaban (XARELTO) 20 MG TABS tablet Take 1 tablet (20 mg total) by mouth daily with supper.  30 tablet  0  . senna-docusate (SENOKOT-S) 8.6-50 MG per tablet Take 1 tablet by mouth 2 (two) times daily. Hold for diarrhea      . torsemide (DEMADEX) 20 MG tablet Take 20 mg by mouth daily.      . traZODone (DESYREL) 100 MG tablet Take 100 mg by mouth at bedtime.       No current facility-administered medications on file prior to visit.   pmh reviewed  Physical exam BP 107/63  Pulse 67  Temp(Src) 97.7 F (36.5 C)  Resp 18   SpO2 96%  Constitutional: He is oriented to person, place, and time. He appears well-developed and well-nourished. No distress.   HENT:   Head: Normocephalic and atraumatic.   Nose: Nose normal.   Mouth/Throat: Oropharynx is clear and moist.   Eyes: Conjunctivae are normal. Pupils are equal, round, and reactive to light.   Neck: Normal range of motion. Neck supple. No JVD present. No thyromegaly present.   Cardiovascular: Normal rate and regular rhythm.  no wheeze. Basilar crackles bilaterally Pulmonary/Chest: Effort normal and breath sounds normal. No respiratory distress. He has no wheezes. He exhibits no tenderness.   Abdominal: Soft. Bowel sounds are normal. No abdominal tenderness, no guarding or rigidity Musculoskeletal: limited range of motion in setting of chronic pain and body habitus Weakness in lower extremities, propels self in wheelchair,  Trace edema both legs, chronic vascular changes in both leg  Lymphadenopathy:  He has no cervical adenopathy.  Neurological: He is alert and oriented to person, place, and time. No cranial nerve deficit.   Skin: Skin is warm and dry. He is not diaphoretic.  Psychiatric: He has a normal mood and affect. His behavior is normal.   Labs reviewed 01/06/13 na 140, k 4.2, glu 174, bun 5, cr 0.7, alb 3.2, lft wnl, amylase 55, lipase 265 01/05/13 a1c 10 01/06/13 cxr- mild pulmonary vascular congestion. No effusion, patchy basilar atelectasis vs interstitial pneumonitis 04/30/13 wbc 6, hb 12.5, hct 41.4, plt 226, bnp 12, a1c 8.5, na 133, k 4.2, glu 278, bun 14, cr 1.1, gfr > 60  Assessment/Plan  Chronic pain- in setting of spinal stenosis. Change oxycodone to 30 mg po q6h with 15 mg po q12h prn breakthrough pain for now   Dm type 2- a1c improved from 9.7 to 8.5. Monitor cbg.continue lantus 30 u and SSI.   Peripheral neuropathy- on neurontin 400 mg tid at present. Monitor   chf- breathing stable, continue demadex for now  Depression- continue  cymbalta and trazodone. This should help with PTSD as well. Recent dose adjustment by psych  Afib- rate currently controlled with cardizem, continue this and xarelto for secondary stroke prophylaxis  Anxiety- xanax has been helpful, no dose changes this visit, monitor  BPH- continue flomax, encourage hydration  Constipation- continue current bowel regimen  GERD- continue protonix for now

## 2013-05-31 ENCOUNTER — Other Ambulatory Visit: Payer: Self-pay | Admitting: *Deleted

## 2013-05-31 MED ORDER — OXYCODONE HCL 15 MG PO TABS: ORAL_TABLET | ORAL | Status: DC

## 2013-05-31 NOTE — Telephone Encounter (Signed)
[facility] 

## 2013-06-09 ENCOUNTER — Other Ambulatory Visit: Payer: Self-pay | Admitting: Internal Medicine

## 2013-06-09 LAB — COMPREHENSIVE METABOLIC PANEL
ALK PHOS: 78 U/L
ANION GAP: 3 — AB (ref 7–16)
Albumin: 3.4 g/dL (ref 3.4–5.0)
BUN: 23 mg/dL — ABNORMAL HIGH (ref 7–18)
Bilirubin,Total: 0.2 mg/dL (ref 0.2–1.0)
CALCIUM: 9.3 mg/dL (ref 8.5–10.1)
Chloride: 99 mmol/L (ref 98–107)
Co2: 36 mmol/L — ABNORMAL HIGH (ref 21–32)
Creatinine: 1.19 mg/dL (ref 0.60–1.30)
Glucose: 184 mg/dL — ABNORMAL HIGH (ref 65–99)
Osmolality: 284 (ref 275–301)
POTASSIUM: 4.5 mmol/L (ref 3.5–5.1)
SGOT(AST): 11 U/L — ABNORMAL LOW (ref 15–37)
SGPT (ALT): 14 U/L (ref 12–78)
Sodium: 138 mmol/L (ref 136–145)
Total Protein: 7.2 g/dL (ref 6.4–8.2)

## 2013-06-09 LAB — CBC WITH DIFFERENTIAL/PLATELET
Basophil #: 0 10*3/uL (ref 0.0–0.1)
Basophil %: 0.6 %
EOS PCT: 1.6 %
Eosinophil #: 0.1 10*3/uL (ref 0.0–0.7)
HCT: 40.3 % (ref 40.0–52.0)
HGB: 12.8 g/dL — ABNORMAL LOW (ref 13.0–18.0)
LYMPHS PCT: 23.2 %
Lymphocyte #: 1.4 10*3/uL (ref 1.0–3.6)
MCH: 30.8 pg (ref 26.0–34.0)
MCHC: 31.7 g/dL — ABNORMAL LOW (ref 32.0–36.0)
MCV: 97 fL (ref 80–100)
Monocyte #: 1 x10 3/mm (ref 0.2–1.0)
Monocyte %: 16.2 %
Neutrophil #: 3.5 10*3/uL (ref 1.4–6.5)
Neutrophil %: 58.4 %
PLATELETS: 209 10*3/uL (ref 150–440)
RBC: 4.15 10*6/uL — ABNORMAL LOW (ref 4.40–5.90)
RDW: 14.6 % — ABNORMAL HIGH (ref 11.5–14.5)
WBC: 6 10*3/uL (ref 3.8–10.6)

## 2013-06-15 ENCOUNTER — Other Ambulatory Visit: Payer: Self-pay | Admitting: Internal Medicine

## 2013-06-15 DIAGNOSIS — R229 Localized swelling, mass and lump, unspecified: Principal | ICD-10-CM

## 2013-06-15 DIAGNOSIS — IMO0002 Reserved for concepts with insufficient information to code with codable children: Secondary | ICD-10-CM

## 2013-06-17 ENCOUNTER — Ambulatory Visit (HOSPITAL_COMMUNITY)
Admission: RE | Admit: 2013-06-17 | Discharge: 2013-06-17 | Disposition: A | Discharge status: Home / Self Care | Payer: PRIVATE HEALTH INSURANCE | Source: Ambulatory Visit | Attending: Internal Medicine | Admitting: Internal Medicine

## 2013-06-17 ENCOUNTER — Other Ambulatory Visit: Payer: Self-pay | Admitting: Internal Medicine

## 2013-06-17 DIAGNOSIS — J40 Bronchitis, not specified as acute or chronic: Secondary | ICD-10-CM | POA: Insufficient documentation

## 2013-06-17 DIAGNOSIS — N62 Hypertrophy of breast: Secondary | ICD-10-CM | POA: Insufficient documentation

## 2013-06-17 DIAGNOSIS — K7689 Other specified diseases of liver: Secondary | ICD-10-CM | POA: Insufficient documentation

## 2013-06-17 DIAGNOSIS — R229 Localized swelling, mass and lump, unspecified: Secondary | ICD-10-CM

## 2013-06-17 DIAGNOSIS — IMO0002 Reserved for concepts with insufficient information to code with codable children: Secondary | ICD-10-CM

## 2013-06-17 MED ORDER — IOHEXOL 300 MG/ML  SOLN
100.0000 mL | Freq: Once | INTRAMUSCULAR | Status: AC | PRN
  Administered 2013-06-17: 100 mL via INTRAVENOUS

## 2013-06-21 ENCOUNTER — Non-Acute Institutional Stay (SKILLED_NURSING_FACILITY): Payer: PRIVATE HEALTH INSURANCE | Admitting: Internal Medicine

## 2013-06-21 ENCOUNTER — Encounter: Payer: Self-pay | Admitting: Internal Medicine

## 2013-06-21 DIAGNOSIS — M792 Neuralgia and neuritis, unspecified: Secondary | ICD-10-CM

## 2013-06-21 DIAGNOSIS — F419 Anxiety disorder, unspecified: Secondary | ICD-10-CM

## 2013-06-21 DIAGNOSIS — F4310 Post-traumatic stress disorder, unspecified: Secondary | ICD-10-CM

## 2013-06-21 DIAGNOSIS — G894 Chronic pain syndrome: Secondary | ICD-10-CM

## 2013-06-21 DIAGNOSIS — I4891 Unspecified atrial fibrillation: Secondary | ICD-10-CM

## 2013-06-21 DIAGNOSIS — F32A Depression, unspecified: Secondary | ICD-10-CM

## 2013-06-21 DIAGNOSIS — F341 Dysthymic disorder: Secondary | ICD-10-CM

## 2013-06-21 DIAGNOSIS — F329 Major depressive disorder, single episode, unspecified: Secondary | ICD-10-CM

## 2013-06-21 DIAGNOSIS — IMO0002 Reserved for concepts with insufficient information to code with codable children: Secondary | ICD-10-CM

## 2013-06-21 NOTE — Progress Notes (Signed)
Patient ID: Ryan [name], male   DOB: [birth year], 63 y.o.   MRN: [id number]    [facility]  Chief Complaint  Patient presents with  . Medical Managment of Chronic Issues    bph, chf, HTN, dm   Code status- full code  Allergies  Allergen Reactions  . Lisinopril Swelling and Rash  . Morphine And Related Swelling and Rash   HPI 63 y/o male patient is seen for routine visit. His nerve pain in his legs are worse at present. He has history of peripheral neuropathy. He mentions his pain in back is currently under control. He feels low and has been having crying episodes and feels his PTSD has been bothering him. He was recently diagnosed with influenza and completed treatment  Review of Systems   Constitutional: Negative for fever, chills and diaphoresis.   HENT: Negative for congestion.    Eyes: Negative for blurred vision.   Respiratory: Negative for cough and shortness of breath.    Cardiovascular: Negative for chest pain and claudication. Negative for leg swelling Gastrointestinal: Negative for heartburn, nausea, vomiting,and constipation.   Genitourinary: Negative for dysuria.  Skin: Negative for rash.   Neurological: Negative for dizziness, sensory change and headaches.   Psychiatric/Behavioral: Negative for depression.   Past Medical History  Diagnosis Date  . Hypertension   . DVT (deep venous thrombosis) ~ 2011    BLE  . 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 Hyperlipidemia   . CAD (coronary atherosclerotic disease)     s/p stents x2  . Small bowel obstruction   . Pancreatitis   . Anxiety   . GERD (gastroesophageal reflux disease)   . CHF (congestive heart failure)   . Myocardial infarction ~ 2009  . Anginal pain   . Pneumonia ~ 2012  . Shortness of breath     "lying down & w/exertion" (03/04/2012)  . Chronic lower back pain   . PTSD (post-traumatic stress disorder)   . Hernia   . Narcotic addiction   . DDD  (degenerative disc disease)     multilevel   . Neuropathy   . Spinal stenosis     with chronic lower back pain and lower extremity pain  . Encephalopathy 07-2012    and sepsis  . Depression   . 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 Hepatitis C 01/02/2013  . Dysrhythmia     atrial flutter  . COPD (chronic obstructive pulmonary disease)   . Periumbilical hernia   . Type II diabetes mellitus   . Pancreatitis   . GERD (gastroesophageal reflux disease)    Past Surgical History  Procedure Laterality Date  . Esophagogastroduodenoscopy  09/22/2011    Procedure: ESOPHAGOGASTRODUODENOSCOPY (EGD);  Surgeon: Charna Elizabeth, MD;  Location: [facility] ENDOSCOPY;  Service: Endoscopy;  Laterality: N/A;  . Esophagogastroduodenoscopy  01/05/2012    Procedure: ESOPHAGOGASTRODUODENOSCOPY (EGD);  Surgeon: Rachael Fee, MD;  Location: [facility] ENDOSCOPY;  Service: Endoscopy;  Laterality: N/A;  . Cholecystectomy  ~ 2009  . Spinal fusion  ~ 2011    lumbar spine.   . Coronary angioplasty with stent placement  ~ 2009`    "1 + ! (after 1st one failed)" (03/04/2012)  . Tonsillectomy and adenoidectomy      tonsils and adenoids  . Eus N/A 01/28/2013    Procedure: UPPER ENDOSCOPIC ULTRASOUND (EUS) LINEAR;  Surgeon: Rachael Feeaniel P Jacobs, MD;  Location: [facility] ENDOSCOPY;  Service: Endoscopy;  Laterality: N/A;    Current Outpatient Prescriptions on File Prior to Visit  Medication Sig Dispense Refill  . acetaminophen (TYLENOL) 325 MG tablet Take 2 tablets (650 mg total) by mouth every 6 (six) hours as needed.      . Cholecalciferol (VITAMIN D) 2000 UNITS CAPS Take 2,000 Units by mouth daily.      Marland Kitchen. diltiazem (CARDIZEM CD) 180 MG 24 hr capsule Take 180 mg by mouth daily.      . DULoxetine (CYMBALTA) 20 MG capsule Take 20 mg by mouth daily.      . DULoxetine (CYMBALTA) 60 MG capsule Take 60 mg by mouth daily.      Marland Kitchen. gabapentin (NEURONTIN) 300 MG capsule Take 400 mg by mouth 3  (three) times daily.       . hydroxypropyl methylcellulose (ISOPTO TEARS) 2.5 % ophthalmic solution Place 1 drop into the right eye 3 (three) times daily.      . insulin aspart (NOVOLOG) 100 UNIT/ML injection Inject 8 Units into the skin 3 (three) times daily after meals.      . insulin glargine (LANTUS) 100 UNIT/ML injection Inject 30 Units into the skin at bedtime.       Marland Kitchen. lactulose (CHRONULAC) 10 GM/15ML solution Take 30 mLs by mouth 3 (three) times daily.       Marland Kitchen. LORazepam (ATIVAN) 1 MG tablet 1 by mouth 30 minutes prior to dental appointment for sedation  1 tablet  5  . losartan (COZAAR) 50 MG tablet Take 50 mg by mouth daily. Hold for sbp<110, repeat blood pressure in 1 hour      . metFORMIN (GLUCOPHAGE) 1000 MG tablet Take 1,000 mg by mouth 2 (two) times daily with a meal.      . ondansetron (ZOFRAN-ODT) 8 MG disintegrating tablet Take 8 mg by mouth every 8 (eight) hours as needed for nausea.      Marland Kitchen. oxyCODONE (ROXICODONE) 15 MG immediate release tablet Take one tablet by mouth every 12 hours as needed for breakthrough pain. Hold for sedation  60 tablet  0  . oxycodone (ROXICODONE) 30 MG immediate release tablet Take one tablet by mouth every 4 hours as needed for pain. Hold for sedation and RR < 10/min  180 tablet  0  . pantoprazole (PROTONIX) 40 MG tablet Take 40 mg by mouth daily.      . polyethylene glycol (MIRALAX / GLYCOLAX) packet Take 17 g by mouth daily.      . Rivaroxaban (XARELTO) 20 MG TABS tablet Take 1 tablet (20 mg total) by mouth daily with supper.  30 tablet  0  . senna-docusate (SENOKOT-S) 8.6-50 MG per tablet Take 1 tablet by mouth 2 (two) times daily. Hold for diarrhea      . tamsulosin (FLOMAX) 0.4 MG CAPS capsule Take 0.4 mg by mouth.      . torsemide (DEMADEX) 20 MG tablet Take 20 mg by mouth daily.      . traZODone (DESYREL) 100 MG tablet Take 100 mg by mouth at bedtime.       No current facility-administered medications on file prior to visit.    Physical exam BP  118/72  Pulse 79  Temp(Src) 97.9 F (36.6 C)  Resp 16  SpO2 96%  Constitutional: He is oriented to person, place, and time. He appears well-developed and well-nourished. No distress.   HENT:   Head: Normocephalic and atraumatic.   Nose: Nose  normal.   Mouth/Throat: Oropharynx is clear and moist.   Eyes: Conjunctivae are normal. Pupils are equal, round, and reactive to light.   Neck: Normal range of motion. Neck supple. No JVD present. No thyromegaly present.   Cardiovascular: Normal rate and regular rhythm.  no wheeze. Basilar crackles bilaterally Pulmonary/Chest: Effort normal and breath sounds normal. No respiratory distress. He has no wheezes. He exhibits no tenderness.   Abdominal: Soft. Bowel sounds are normal. No abdominal tenderness, no guarding or rigidity Musculoskeletal: limited range of motion in setting of chronic pain and body habitus Weakness in lower extremities, propels self in wheelchair,  Trace edema both legs, chronic vascular changes in both leg  Lymphadenopathy:  He has no cervical adenopathy.  Neurological: He is alert and oriented to person, place, and time. No cranial nerve deficit.   Skin: Skin is warm and dry. He is not diaphoretic.  Psychiatric: tearful this visit. Has been having nightmares and vivid flashback during the day   Labs reviewed 01/06/13 na 140, k 4.2, glu 174, bun 5, cr 0.7, alb 3.2, lft wnl, amylase 55, lipase 265 01/05/13 a1c 10 01/06/13 cxr- mild pulmonary vascular congestion. No effusion, patchy basilar atelectasis vs interstitial pneumonitis 04/30/13 wbc 6, hb 12.5, hct 41.4, plt 226, bnp 12, a1c 8.5, na 133, k 4.2, glu 278, bun 14, cr 1.1, gfr > 60  Assessment/Plan  PTSD- start paroxetine 20 mg daily. Psych referral  Depression- continue cymbalta and also started on paroxetine  Neuropathic pain- Change neurontin to 600 mg at bedtime and 300 mg bid for now. reassess  Chronic pain- in setting of spinal stenosis. Current regimen of  oxycodone helpful, no changes made  Dm type 2- a1c improved from 9.7 to 8.5. Monitor cbg.continue lantus 30 u and SSI.   Afib- rate currently controlled with cardizem, continue this and xarelto for secondary stroke prophylaxis

## 2013-07-01 ENCOUNTER — Other Ambulatory Visit: Payer: Self-pay | Admitting: *Deleted

## 2013-07-01 DIAGNOSIS — F329 Major depressive disorder, single episode, unspecified: Secondary | ICD-10-CM

## 2013-07-01 DIAGNOSIS — F419 Anxiety disorder, unspecified: Principal | ICD-10-CM

## 2013-07-01 MED ORDER — ALPRAZOLAM 0.5 MG PO TABS: ORAL_TABLET | ORAL | Status: DC

## 2013-07-01 NOTE — Telephone Encounter (Signed)
[facility] 

## 2013-07-07 DIAGNOSIS — F4310 Post-traumatic stress disorder, unspecified: Secondary | ICD-10-CM | POA: Insufficient documentation

## 2013-07-07 DIAGNOSIS — M792 Neuralgia and neuritis, unspecified: Secondary | ICD-10-CM | POA: Insufficient documentation

## 2013-07-14 ENCOUNTER — Ambulatory Visit (INDEPENDENT_AMBULATORY_CARE_PROVIDER_SITE_OTHER): Payer: PRIVATE HEALTH INSURANCE | Admitting: Cardiovascular Disease

## 2013-07-14 ENCOUNTER — Encounter: Payer: Self-pay | Admitting: Cardiovascular Disease

## 2013-07-14 VITALS — BP 128/81 | HR 94 | Ht 77.0 in

## 2013-07-14 DIAGNOSIS — R609 Edema, unspecified: Secondary | ICD-10-CM

## 2013-07-14 DIAGNOSIS — Z79899 Other long term (current) drug therapy: Secondary | ICD-10-CM

## 2013-07-14 DIAGNOSIS — I2510 Atherosclerotic heart disease of native coronary artery without angina pectoris: Secondary | ICD-10-CM

## 2013-07-14 DIAGNOSIS — I10 Essential (primary) hypertension: Secondary | ICD-10-CM

## 2013-07-14 DIAGNOSIS — I4892 Unspecified atrial flutter: Secondary | ICD-10-CM

## 2013-07-14 LAB — BASIC METABOLIC PANEL
BUN: 13 mg/dL (ref 6–23)
CHLORIDE: 95 meq/L — AB (ref 96–112)
CO2: 37 mEq/L — ABNORMAL HIGH (ref 19–32)
Calcium: 9.7 mg/dL (ref 8.4–10.5)
Creatinine, Ser: 1.1 mg/dL (ref 0.4–1.5)
GFR: 87.17 mL/min (ref >60.00)
Glucose, Bld: 160 mg/dL — ABNORMAL HIGH (ref 70–99)
POTASSIUM: 4.2 meq/L (ref 3.5–5.1)
SODIUM: 138 meq/L (ref 135–145)

## 2013-07-14 MED ORDER — TORSEMIDE 20 MG PO TABS: 20.0000 mg | ORAL_TABLET | Freq: Two times a day (BID) | ORAL | Status: DC

## 2013-07-14 NOTE — Progress Notes (Signed)
Patient ID: Ryan [name], male   DOB: 1950-11-25, 63 y.o.   MRN: [id number] 63 yo referred by [facility] for edema and ? CHF. Patient has some memory issues. Mother is health care power of attorney in room with him Had been cared for in [place] area until recently. Inidcates CAD with "stents" done at [facility] 3-4 years ago. Also indicates being on coumadin in past. He was hospitalized 4/14 after a fall and intubated and cared for by our CCM team. In NSR at that time has had chronic LE edema with recent LE duplex showing no DVT but poor quality study. Started on xarelto in July and has been given by [facility]. Recently admitted for idiopathic pancreatitis and is seeing GI for this. CR .76 two weeks ago Lipase 247  Reviewed records from [facility] Stent to LAD in 2008 Restenosis in 2009 EF 30-35% Echo done in our office 7/14 showed EF 55%   Moving to [facility] [place] [facility]   Needs lots of rehab  Neuropathy worse in hands Can go bed to chair but not much more      ROS: Denies fever, malais, weight loss, blurry vision, decreased visual acuity, cough, sputum, SOB, hemoptysis, pleuritic pain, palpitaitons, heartburn, abdominal pain, melena, lower extremity edema, claudication, or rash.  All other systems reviewed and negative  General: Affect appropriate Chronically ill black male Obese  HEENT: normal Neck supple with no adenopathy JVP normal no bruits no thyromegaly Lungs clear with no wheezing and good diaphragmatic motion Heart:  S1/S2 no murmur, no rub, gallop or click PMI normal Abdomen: benighn, BS positve, no tenderness, no AAA no bruit.  No HSM or HJR Distal pulses intact with no bruits Plus 2 Brawny  edema Neuro non-focal Skin warm and dry Diffuse weakness and poor ROM hips and shoulders    Current Outpatient Prescriptions  Medication Sig Dispense Refill  . acetaminophen (TYLENOL) 325 MG tablet Take 2 tablets (650 mg total) by mouth every 6 (six)  hours as needed.      . ALPRAZolam (XANAX) 0.5 MG tablet Take one tablet by mouth three times daily for anxiety  90 tablet  5  . Cholecalciferol (VITAMIN D) 2000 UNITS CAPS Take 2,000 Units by mouth daily.      Marland Kitchen diltiazem (CARDIZEM CD) 180 MG 24 hr capsule Take 180 mg by mouth daily.      . DULoxetine (CYMBALTA) 20 MG capsule Take 20 mg by mouth daily.      . DULoxetine (CYMBALTA) 60 MG capsule Take 60 mg by mouth daily.      Marland Kitchen gabapentin (NEURONTIN) 300 MG capsule Take 400 mg by mouth 3 (three) times daily.       . hydroxypropyl methylcellulose (ISOPTO TEARS) 2.5 % ophthalmic solution Place 1 drop into the right eye 3 (three) times daily.      . insulin aspart (NOVOLOG) 100 UNIT/ML injection Inject 8 Units into the skin 3 (three) times daily after meals.      . insulin glargine (LANTUS) 100 UNIT/ML injection Inject 30 Units into the skin at bedtime.       Marland Kitchen lactulose (CHRONULAC) 10 GM/15ML solution Take 30 mLs by mouth 3 (three) times daily.       Marland Kitchen LORazepam (ATIVAN) 1 MG tablet 1 by mouth 30 minutes prior to dental appointment for sedation  1 tablet  5  . losartan (COZAAR) 50 MG tablet Take 50 mg by mouth daily. Hold for sbp<110, repeat blood pressure in  1 hour      . metFORMIN (GLUCOPHAGE) 1000 MG tablet Take 1,000 mg by mouth 2 (two) times daily with a meal.      . ondansetron (ZOFRAN-ODT) 8 MG disintegrating tablet Take 8 mg by mouth every 8 (eight) hours as needed for nausea.      Marland Kitchen. oxycodone (ROXICODONE) 30 MG immediate release tablet Take one tablet by mouth every 4 hours as needed for pain. Hold for sedation and RR < 10/min  180 tablet  0  . pantoprazole (PROTONIX) 40 MG tablet Take 40 mg by mouth daily.      . polyethylene glycol (MIRALAX / GLYCOLAX) packet Take 17 g by mouth daily.      . Rivaroxaban (XARELTO) 20 MG TABS tablet Take 1 tablet (20 mg total) by mouth daily with supper.  30 tablet  0  . senna-docusate (SENOKOT-S) 8.6-50 MG per tablet Take 1 tablet by mouth 2 (two) times  daily. Hold for diarrhea      . tamsulosin (FLOMAX) 0.4 MG CAPS capsule Take 0.4 mg by mouth.      . torsemide (DEMADEX) 20 MG tablet Take 20 mg by mouth daily.      . traZODone (DESYREL) 100 MG tablet Take 100 mg by mouth at bedtime.       No current facility-administered medications for this visit.    Allergies  Lisinopril and Morphine and related  Electrocardiogram:  SR rate 92  First degree   Assessment and Plan

## 2013-07-14 NOTE — Assessment & Plan Note (Signed)
Stable with no angina and good activity level.  Continue medical Rx  

## 2013-07-14 NOTE — Assessment & Plan Note (Signed)
Likely postphlebitic  Will interfere with rehab  Increase demedex to bid and check BMET today

## 2013-07-14 NOTE — Patient Instructions (Signed)
Your physician wants you to follow-up in: YEAR WITH  DR  Haywood FillerNISHAN You will receive a reminder letter in the mail two months in advance. If you don't receive a letter, please call our office to schedule the follow-up appointment. Your physician has recommended you make the following change in your medication: INCREASE  DEMADEX  TO  TWICE  DAILY  Your physician recommends that you return for lab work in: TODAY   BMET

## 2013-07-14 NOTE — Addendum Note (Signed)
Addended by: Tonita PhoenixBOWDEN, Ashur Glatfelter K on: 07/14/2013 11:54 AM   Modules accepted: Orders

## 2013-07-14 NOTE — Assessment & Plan Note (Signed)
Well controlled.  Continue current medications and low sodium Dash type diet.    

## 2013-07-14 NOTE — Assessment & Plan Note (Signed)
In NSR history of DVT and sedentary  Continue xarelto

## 2013-12-08 ENCOUNTER — Ambulatory Visit (INDEPENDENT_AMBULATORY_CARE_PROVIDER_SITE_OTHER): Payer: PRIVATE HEALTH INSURANCE | Admitting: Physician Assistant

## 2013-12-08 ENCOUNTER — Encounter: Payer: Self-pay | Admitting: Physician Assistant

## 2013-12-08 VITALS — BP 96/54 | HR 72 | Ht 77.0 in | Wt 326.0 lb

## 2013-12-08 DIAGNOSIS — I10 Essential (primary) hypertension: Secondary | ICD-10-CM

## 2013-12-08 DIAGNOSIS — I5043 Acute on chronic combined systolic (congestive) and diastolic (congestive) heart failure: Secondary | ICD-10-CM

## 2013-12-08 DIAGNOSIS — I5042 Chronic combined systolic (congestive) and diastolic (congestive) heart failure: Secondary | ICD-10-CM | POA: Insufficient documentation

## 2013-12-08 DIAGNOSIS — I4892 Unspecified atrial flutter: Secondary | ICD-10-CM

## 2013-12-08 DIAGNOSIS — I959 Hypotension, unspecified: Secondary | ICD-10-CM

## 2013-12-08 DIAGNOSIS — I509 Heart failure, unspecified: Secondary | ICD-10-CM

## 2013-12-08 DIAGNOSIS — I5032 Chronic diastolic (congestive) heart failure: Secondary | ICD-10-CM | POA: Insufficient documentation

## 2013-12-08 DIAGNOSIS — I2510 Atherosclerotic heart disease of native coronary artery without angina pectoris: Secondary | ICD-10-CM

## 2013-12-08 MED ORDER — DILTIAZEM HCL ER COATED BEADS 120 MG PO CP24: 120.0000 mg | ORAL_CAPSULE | Freq: Every day | ORAL | Status: AC

## 2013-12-08 NOTE — Patient Instructions (Signed)
Your physician has recommended you make the following change in your medication:  1) DECREASE Diltiazem to 120mg  daily  Take all other medications as prescribed  Your physician has requested that you have an echocardiogram. Echocardiography is a painless test that uses sound waves to create images of your heart. It provides your doctor with information about the size and shape of your heart and how well your heart's chambers and valves are working. This procedure takes approximately one hour. There are no restrictions for this procedure.  Your physician recommends that you schedule a follow-up appointment in: 4 weeks with Dr.Nishan/ or the PA/NP a day that Dr.Nishan is in the office

## 2013-12-08 NOTE — Progress Notes (Signed)
[facility] (logo was not working in [employer] today!)  [address] [place], [place]  [address] Phone: [phone] Fax:  [phone]  Date:  12/08/2013   Patient ID:  Ryan [name], Ryan [name] 1950-08-20, MRN [id number]   PCP:  Warrick Parisian, MD  Cardiologist:  Dr. Eden Emms  History of Present Illness: Ryan [name] is a 63 y.o. male with multiple medical problems including obesity, HTN, DM, memory issues, h/o atrial flutter, CAD s/p prior stenting, pancreatitis, chronic pain on chronic narcotics and CHF with varying EFs. Per Dr. 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 He was hospitalized 4/14 after a fall and intubated and cared for by our CCM team. He was in NSR at that time. He has had chronic LE edema with prior duplex showing no DVT but poor quality study. He is on Xarelto. Was last seen by Dr. Eden Emms 06/2013 for CHF at which time Demadex was increased.   Per nursing home records and patient, he recently had lethargy and hypoxia at the end of July and was found to have a UTI. His symptoms dramatically increased with treatment of UTI. Demadex was increased 7/21 to 40mg  BID for 3 days due to hypoxia and faint crackles with subsequent good clinical response with weight loss of -15lbs. He is now on 30mg  BID. He reports feeling better daily, compared to how he was feeling. No CP but has chronic diffuse pain for which he takes oxycodone. Denies dyspnea, orthopnea, LEE. He recently has found he enjoys increasing his activity in the therapy gym.   Recent Labs: BMET at nursing home - 12/07/13 - Na 139, K 4.0, Cl 97, CO2 36, BUN 9, Cr 0.93, Calcium 9 (recently started on K for K of 3.4) 12/31/2012: ALT 11; Hemoglobin 15.0  07/14/2013: Creatinine 1.1; Potassium 4.2   Wt Readings from Last 3 Encounters:  12/08/13 326 lb  (147.873 kg)  02/01/13 324 lb (146.965 kg)  01/28/13 317 lb (143.79 kg)     Past Medical History  Diagnosis Date  . Hypertension   . DVT (deep venous thrombosis) ~ 2011    BLE  . 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 Hyperlipidemia   . CAD (coronary atherosclerotic disease)     a. Per Dr. Fabio Bering review of [facility] records - s/p stent to LAD 2008 and restenosis 2009.  Marland Kitchen Small bowel obstruction   . Pancreatitis   . Anxiety   . GERD (gastroesophageal reflux disease)   . CHF (congestive heart failure)     a. Prior EF 30-35% per Dr. Fabio Bering note. b. EF 55% in 10/2012.  Marland Kitchen Myocardial infarction ~ 2009  . Pneumonia ~ 2012  . Chronic lower back pain   . PTSD (post-traumatic stress disorder)   . Hernia   . Narcotic addiction   . DDD (degenerative disc disease)     multilevel   . Neuropathy   . Spinal stenosis     with chronic lower back pain and lower extremity pain  . Encephalopathy 07-2012    and sepsis  . Depression   . 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 Hepatitis C 01/02/2013  . Paroxysmal atrial flutter   . COPD (chronic  obstructive pulmonary disease)   . Periumbilical hernia   . Type II diabetes mellitus   . Pancreatitis   . GERD (gastroesophageal reflux disease)     Current Outpatient Prescriptions  Medication Sig Dispense Refill  . acetaminophen (TYLENOL) 325 MG tablet Take 2 tablets (650 mg total) by mouth every 6 (six) hours as needed.      Marland Kitchen. albuterol (PROVENTIL) (2.5 MG/3ML) 0.083% nebulizer solution Take 2.5 mg by nebulization every 4 (four) hours as needed for wheezing.      Marland Kitchen. ALPRAZolam (XANAX) 0.5 MG tablet 0.25 mg. Take one tablet by mouth three times daily for anxiety      . busPIRone (BUSPAR) 15 MG tablet Take 15 mg by mouth 2 (two) times daily.      . Cholecalciferol (VITAMIN D) 2000 UNITS CAPS Take 2,000 Units by mouth daily.      Marland Kitchen. diltiazem (CARDIZEM CD) 180 MG 24  hr capsule Take 1 capsule (180 mg total) by mouth daily.  30 capsule  5  . docusate sodium (COLACE) 100 MG capsule Take 100 mg by mouth at bedtime.      . DULoxetine (CYMBALTA) 60 MG capsule Take 80 mg by mouth daily. Takes 60mg  tablet along with a 20mg  tablet      . fluticasone (VERAMYST) 27.5 MCG/SPRAY nasal spray Place 2 sprays into the nose daily.       Marland Kitchen. gabapentin (NEURONTIN) 300 MG capsule Take 600-900 mg by mouth 2 (two) times daily. Take 900mg  in AM and 600mg  in PM      . insulin glargine (LANTUS) 100 UNIT/ML injection Inject 34 Units into the skin at bedtime.       . insulin lispro (HUMALOG) 100 UNIT/ML injection Inject 0-15 Units into the skin as directed. Sliding scale per nursing home      . lactulose (CHRONULAC) 10 GM/15ML solution Take 30 mLs by mouth daily.       Marland Kitchen. losartan (COZAAR) 50 MG tablet Take 50 mg by mouth daily. Hold for sbp<110, repeat blood pressure in 1 hour      . Melatonin 1 MG CAPS Take 3 mg by mouth at bedtime.       . metFORMIN (GLUCOPHAGE) 1000 MG tablet Take 1,000 mg by mouth 2 (two) times daily with a meal.      . ondansetron (ZOFRAN-ODT) 8 MG disintegrating tablet Take 8 mg by mouth every 8 (eight) hours as needed for nausea.      Marland Kitchen. oxycodone (ROXICODONE) 30 MG immediate release tablet Take 30 mg by mouth every 3 (three) hours as needed for pain. Hold for sedation and RR < 10/min      . pantoprazole (PROTONIX) 40 MG tablet Take 40 mg by mouth daily.      . polyethylene glycol (MIRALAX / GLYCOLAX) packet Take 17 g by mouth 2 (two) times daily.       . potassium chloride (K-DUR,KLOR-CON) 10 MEQ tablet Take 10 mEq by mouth daily.      . Rivaroxaban (XARELTO) 20 MG TABS tablet Take 1 tablet (20 mg total) by mouth daily with supper.  30 tablet  0  . senna-docusate (SENOKOT-S) 8.6-50 MG per tablet Take 1 tablet by mouth 2 (two) times daily. Hold for diarrhea      . tamsulosin (FLOMAX) 0.4 MG CAPS capsule Take 0.4 mg by mouth daily.       Marland Kitchen. torsemide (DEMADEX) 20 MG  tablet Take 30 mg by mouth 2 (two) times daily.       .Marland Kitchen  traZODone (DESYREL) 100 MG tablet Take 100 mg by mouth at bedtime.       No current facility-administered medications for this visit.    Allergies:   Lisinopril and Morphine and related   Social History:  The patient  reports that he quit smoking about 17 months ago. His smoking use included Cigarettes. He has a 19 pack-year smoking history. He has never used smokeless tobacco. He reports that he drinks alcohol. He reports that he uses illicit drugs (Marijuana).   Family History:  The patient's family history includes Diabetes in his father. There is no history of Colon cancer.   ROS:  Please see the history of present illness.   All other systems reviewed and negative.   PHYSICAL EXAM:  VS:  BP 96/54  Pulse 72  Ht 6\' 5"  (1.956 m)  Wt 326 lb (147.873 kg)  BMI 38.65 kg/m2. Confirmed BP manually Well nourished, well developed chronically ill AAM but interactive, in no acute distress HEENT: normal Neck: no JVD Cardiac:  normal S1, S2; RRR; no murmur Lungs:  clear to auscultation bilaterally, no wheezing, rhonchi or rales Abd: soft, nontender, no hepatomegaly Ext: no edema, dry skin noted Skin: warm and dry Neuro:  moves all extremities spontaneously, no focal abnormalities noted  EKG:  NSR 72bpm, 1st degree AVB, nonspecific ST-T changes, unchanged from prior     ASSESSMENT AND PLAN:  1. Chronic diastolic CHF with h/o cor pulmonale by echo 10/2012 2. Paroxysmal atrial flutter 3. Hypotension with history of HTN 4. CAD s/p prior PCI, no recent anginal-type symptoms  I believe he is doing well and seems to have improved from most recent exacerbation. Blood pressure is a little low thus will decrease diltiazem to 120mg  daily. Labs 12/07/13 stable. Will check updated echocardiogram given recent CHF exacerbation. If EF is low, would consider changing CCB to beta blocker instead. Given h/o pancreatic issues (and hepatitis, chronic  lactulose use) will not start statin at this time. Maintaining NSR. Continue Xarelto.   Dispo: F/u 4 weeks with Dr. Eden Emms.  Signed, Ronie Spies, PA-C  12/08/2013 9:54 AM

## 2013-12-15 ENCOUNTER — Other Ambulatory Visit (HOSPITAL_COMMUNITY): Payer: PRIVATE HEALTH INSURANCE

## 2014-01-06 DIAGNOSIS — I50810 Right heart failure, unspecified: Secondary | ICD-10-CM | POA: Insufficient documentation

## 2014-01-06 NOTE — Progress Notes (Signed)
Cardiology Office Note    Date:  01/07/2014   ID:  Ryan [name], DOB November 24, 1950, MRN [id number]  PCP:  Warrick Parisian, MD  Cardiologist:  Dr. Charlton Haws     History of Present Illness: Ryan [name] is a 63 y.o. male with a hx of obesity, HTN, DM, memory issues, AFlutter, CAD s/p prior stenting, CHF with varying EFs, pancreatitis, chronic pain.  Prior records [facility]) indicate stent to LAD in 2008 with restenosis 2009.  LHC in 2011 demonstrated patent stents and no significant CAD elsewhere.  EF in past as low as 30-35%.  Last echo in 7/14 demonstrated normal LVF but evidence of RV failure and cor pulmonale.  He has chronic LE edema with prior Duplex neg for DVT.  He is on Xarelto.  Recently seen by Ronie Spies, PA-C after diuretics were increased at the [facility] for volume excess.  FU echo was done at the [facility] - this demonstrated normal LVF.  He returns for FU.  He lives at [facility].  He works out daily.  Denies chest pain, dyspnea, syncope, orthopnea, PND.  LE edema remains improved.     Studies:  - LHC (6/11):  prox LAD stents ok, mid RCA 40%, EF 45%, apical AK  - Echo (7/14):  Mild LVH, EF 55%, MAC, mild MR, mild LAE, severe RVE, mod RAE, PASP 39 mmHg (RV failure and cor pulmonale)  - Echo (12/13/13):  Mod LVH, EF 55%, diastolic dysfunction, mild RVE   Recent Labs/Images: 07/14/2013: Creatinine 1.1; Potassium 4.2 12/07/13:  K 4, Cr 0.93   Ct Chest W Contrast  06/17/2013    IMPRESSION: 1. No evidence of lung mass or adenopathy. 2. Patchy areas of predominantly subpleural reticulation and architectural distortion are grossly stable, consistent with fibrosis. No evidence of acute superimposed inflammation. 3. Gynecomastia and hepatic steatosis noted.   Electronically Signed   By: Roxy Horseman M.D.   On: 06/17/2013 13:01     Wt Readings from Last 3 Encounters:  01/07/14 334 lb (151.501 kg)  12/08/13 326 lb (147.873 kg)  02/01/13 324 lb (146.965 kg)     Past  Medical History  Diagnosis Date  . Hypertension   . DVT (deep venous thrombosis) ~ 2011    BLE  . 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 Hyperlipidemia   . CAD (coronary atherosclerotic disease)     a. Per Dr. Fabio Bering review of [facility] records - s/p stent to LAD 2008 and restenosis 2009.  Marland Kitchen Small bowel obstruction   . Pancreatitis   . Anxiety   . GERD (gastroesophageal reflux disease)   . CHF (congestive heart failure)     a. Prior EF 30-35% per Dr. Fabio Bering note. b. EF 55% in 10/2012.  Marland Kitchen Myocardial infarction ~ 2009  . Pneumonia ~ 2012  . Chronic lower back pain   . PTSD (post-traumatic stress disorder)   . Hernia   . Narcotic addiction   . DDD (degenerative disc disease)     multilevel   . Neuropathy   . Spinal stenosis     with chronic lower back pain and lower extremity pain  . Encephalopathy 07-2012    and sepsis  . Depression   . 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 Hepatitis C 01/02/2013  . Paroxysmal atrial flutter   . COPD (chronic obstructive pulmonary disease)   .  Periumbilical hernia   . Type II diabetes mellitus   . Pancreatitis   . GERD (gastroesophageal reflux disease)     Current Outpatient Prescriptions  Medication Sig Dispense Refill  . acetaminophen (TYLENOL) 325 MG tablet Take 2 tablets (650 mg total) by mouth every 6 (six) hours as needed.      Marland Kitchen albuterol (PROVENTIL) (2.5 MG/3ML) 0.083% nebulizer solution Take 2.5 mg by nebulization every 4 (four) hours as needed for wheezing.      Marland Kitchen ALPRAZolam (XANAX) 0.5 MG tablet 0.25 mg. Take one tablet by mouth three times daily for anxiety      . busPIRone (BUSPAR) 15 MG tablet Take 15 mg by mouth 2 (two) times daily.      . Cholecalciferol (VITAMIN D) 2000 UNITS CAPS Take 2,000 Units by mouth daily.      Marland Kitchen diltiazem (CARDIZEM CD) 120 MG 24 hr capsule Take 1 capsule (120 mg total) by mouth daily.  30 capsule  5  . docusate  sodium (COLACE) 100 MG capsule Take 100 mg by mouth at bedtime.      . DULoxetine (CYMBALTA) 60 MG capsule Take 80 mg by mouth daily. Takes  tablet along with a  tablet      . fluticasone (VERAMYST) 27.5 MCG/SPRAY nasal spray Place 2 sprays into the nose daily.       Marland Kitchen gabapentin (NEURONTIN) 300 MG capsule Take 600-900 mg by mouth 2 (two) times daily. Take  in AM and  in PM      . insulin glargine (LANTUS) 100 UNIT/ML injection Inject 34 Units into the skin at bedtime.       . insulin lispro (HUMALOG) 100 UNIT/ML injection Inject 0-15 Units into the skin as directed. Sliding scale per nursing home      . lactulose (CHRONULAC) 10 GM/15ML solution Take 30 mLs by mouth daily.       Marland Kitchen LORazepam (ATIVAN) 0.5 MG tablet Take 0.5 mg by mouth every 8 (eight) hours.      Marland Kitchen losartan (COZAAR) 50 MG tablet Take 50 mg by mouth daily. Hold for sbp<110, repeat blood pressure in 1 hour      . Melatonin 1 MG CAPS Take 3 mg by mouth at bedtime.       . metFORMIN (GLUCOPHAGE) 1000 MG tablet Take 1,000 mg by mouth 2 (two) times daily with a meal.      . ondansetron (ZOFRAN-ODT) 8 MG disintegrating tablet Take 8 mg by mouth every 8 (eight) hours as needed for nausea.      Marland Kitchen oxycodone (ROXICODONE) 30 MG immediate release tablet Take 30 mg by mouth every 3 (three) hours as needed for pain. Hold for sedation and RR < 10/min      . pantoprazole (PROTONIX) 40 MG tablet Take 40 mg by mouth daily.      . polyethylene glycol (MIRALAX / GLYCOLAX) packet Take 17 g by mouth 2 (two) times daily.       . potassium chloride (K-DUR,KLOR-CON) 10 MEQ tablet Take 10 mEq by mouth daily.      . Rivaroxaban (XARELTO) 20 MG TABS tablet Take 1 tablet (20 mg total) by mouth daily with supper.  30 tablet  0  . senna-docusate (SENOKOT-S) 8.6-50 MG per tablet Take 1 tablet by mouth 2 (two) times daily. Hold for diarrhea      . tamsulosin (FLOMAX) 0.4 MG CAPS capsule Take 0.4 mg by mouth daily.       Marland Kitchen torsemide (DEMADEX) 20 MG  tablet Take 30 mg by mouth 2 (two) times daily.       . traZODone (DESYREL) 100 MG tablet Take 100 mg by mouth at bedtime.       No current facility-administered medications for this visit.     Allergies:   Lisinopril and Morphine and related   Social History:  The patient  reports that he quit smoking about 18 months ago. His smoking use included Cigarettes. He has a 19 pack-year smoking history. He has never used smokeless tobacco. He reports that he drinks alcohol. He reports that he uses illicit drugs (Marijuana).   Family History:  The patient's family history includes Diabetes in his father; Hypertension in his maternal grandfather. There is no history of Colon cancer, Heart attack, or Stroke.   ROS:  Please see the history of present illness.   No bleeding issues   All other systems reviewed and negative.   PHYSICAL EXAM: VS:  BP 109/58  Pulse 77  Ht  (1.956 m)  Wt 334 lb (151.501 kg)  BMI 39.60 kg/m2 Well nourished, well developed, in no acute distress HEENT: normal Neck: no JVDat 90 degrees Cardiac:  normal S1, S2; RRR; no murmur Lungs:  clear to auscultation bilaterally, no wheezing, rhonchi or rales Abd: soft, nontender, no hepatomegaly Ext: trace bilateral LE edemawith chronic changes Skin: warm and dry Neuro:  CNs 2-12 intact, no focal abnormalities noted  EKG:  NSR, HR 77, no ST changes     ASSESSMENT AND PLAN:  1. Chronic diastolic heart failure with Right heart failure:  Volume stable.  Recent creatinine stable.  Continue current Rx. 2. Paroxysmal atrial flutter:  Maintaining NSR.  He is tolerating Xarelto.   3. HYPERTENSION:  Controlled.  4. CORONARY ARTERY DISEASE:  No angina.  He is not on ASA as he is on Xarelto.  Statin rx has not been initiated due to prior liver issues. 5. Type II or unspecified type diabetes mellitus without mention of complication, not stated as uncontrolled:  Recent A1c 7.9.  FU with PCP.  Disposition:  FU with Dr. Charlton Haws  in 6 mos   Signed, Brynda Rim, MHS 01/07/2014 9:11 AM    [facility] [address] [place], [place], [place]  [address] Phone: [phone]; Fax: [phone]

## 2014-01-07 ENCOUNTER — Ambulatory Visit (INDEPENDENT_AMBULATORY_CARE_PROVIDER_SITE_OTHER): Payer: PRIVATE HEALTH INSURANCE | Admitting: Physician Assistant

## 2014-01-07 ENCOUNTER — Encounter: Payer: Self-pay | Admitting: Physician Assistant

## 2014-01-07 VITALS — BP 109/58 | HR 77 | Ht 77.0 in | Wt 334.0 lb

## 2014-01-07 DIAGNOSIS — E119 Type 2 diabetes mellitus without complications: Secondary | ICD-10-CM

## 2014-01-07 DIAGNOSIS — I50810 Right heart failure, unspecified: Secondary | ICD-10-CM

## 2014-01-07 DIAGNOSIS — I10 Essential (primary) hypertension: Secondary | ICD-10-CM

## 2014-01-07 DIAGNOSIS — I4892 Unspecified atrial flutter: Secondary | ICD-10-CM

## 2014-01-07 DIAGNOSIS — I2510 Atherosclerotic heart disease of native coronary artery without angina pectoris: Secondary | ICD-10-CM

## 2014-01-07 DIAGNOSIS — I509 Heart failure, unspecified: Secondary | ICD-10-CM

## 2014-01-07 DIAGNOSIS — I5032 Chronic diastolic (congestive) heart failure: Secondary | ICD-10-CM

## 2014-01-07 NOTE — Patient Instructions (Signed)
Your physician recommends that you continue on your current medications as directed. Please refer to the Current Medication list given to you today.  Your physician wants you to follow-up in: 6 months with Dr. Nishan. You will receive a reminder letter in the mail two months in advance. If you don't receive a letter, please call our office to schedule the follow-up appointment.  

## 2014-08-24 NOTE — Progress Notes (Signed)
Cardiology Office Note    Date:  08/24/2014   ID:  Ryan [name], DOB Jan 03, 1951, MRN [id number]  PCP:  Warrick Parisian, MD  Cardiologist:  Dr. Charlton Haws     History of Present Illness: Ryan [name] is a 64 y.o. male with a hx of obesity, HTN, DM, memory issues, AFlutter, CAD s/p prior stenting, CHF with varying EFs, pancreatitis, chronic pain.  Prior records [facility]) indicate stent to LAD in 2008 with restenosis 2009.  LHC in 2011 demonstrated patent stents and no significant CAD elsewhere.  EF in past as low as 30-35%.  Last echo in 7/14 demonstrated normal LVF but evidence of RV failure and cor pulmonale.  He has chronic LE edema with prior Duplex neg for DVT.  He is on Xarelto.  Recently seen by Ronie Spies, PA-C after diuretics were increased at the [facility] for volume excess.  FU echo was done at the [facility] - this demonstrated normal LVF.  He returns for FU.  He lives at [facility].  He works out daily.  Denies chest pain, dyspnea, syncope, orthopnea, PND.  LE edema remains improved.     Studies:  - LHC (6/11):  prox LAD stents ok, mid RCA 40%, EF 45%, apical AK  - Echo (7/14):  Mild LVH, EF 55%, MAC, mild MR, mild LAE, severe RVE, mod RAE, PASP 39 mmHg (RV failure and cor pulmonale)  - Echo (12/13/13):  Mod LVH, EF 55%, diastolic dysfunction, mild RVE   Recent Labs/Images: No results found for requested labs within last 365 days.12/07/13:  K 4, Cr 0.93   Ct Chest W Contrast  06/17/2013    IMPRESSION: 1. No evidence of lung mass or adenopathy. 2. Patchy areas of predominantly subpleural reticulation and architectural distortion are grossly stable, consistent with fibrosis. No evidence of acute superimposed inflammation. 3. Gynecomastia and hepatic steatosis noted.   Electronically Signed   By: Roxy Horseman M.D.   On: 06/17/2013 13:01     Wt Readings from Last 3 Encounters:  01/07/14 334 lb (151.501 kg)  12/08/13 326 lb (147.873 kg)  02/01/13 324 lb (146.965 kg)       Past Medical History  Diagnosis Date  . Hypertension   . DVT (deep venous thrombosis) ~ 2011    BLE  . 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 Hyperlipidemia   . CAD (coronary atherosclerotic disease)     a. Per Dr. Fabio Bering review of [facility] records - s/p stent to LAD 2008 and restenosis 2009.  Marland Kitchen Small bowel obstruction   . Pancreatitis   . Anxiety   . GERD (gastroesophageal reflux disease)   . CHF (congestive heart failure)     a. Prior EF 30-35% per Dr. Fabio Bering note. b. EF 55% in 10/2012.  Marland Kitchen Myocardial infarction ~ 2009  . Pneumonia ~ 2012  . Chronic lower back pain   . PTSD (post-traumatic stress disorder)   . Hernia   . Narcotic addiction   . DDD (degenerative disc disease)     multilevel   . Neuropathy   . Spinal stenosis     with chronic lower back pain and lower extremity pain  . Encephalopathy 07-2012    and sepsis  . Depression   . 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 Hepatitis C 01/02/2013  . Paroxysmal atrial flutter   . COPD (  chronic obstructive pulmonary disease)   . Periumbilical hernia   . Type II diabetes mellitus   . Pancreatitis   . GERD (gastroesophageal reflux disease)     Current Outpatient Prescriptions  Medication Sig Dispense Refill  . acetaminophen (TYLENOL) 325 MG tablet Take 2 tablets (650 mg total) by mouth every 6 (six) hours as needed.    Marland Kitchen albuterol (PROVENTIL) (2.5 MG/3ML) 0.083% nebulizer solution Take 2.5 mg by nebulization every 4 (four) hours as needed for wheezing.    Marland Kitchen ALPRAZolam (XANAX) 0.5 MG tablet 0.25 mg. Take one tablet by mouth three times daily for anxiety    . busPIRone (BUSPAR) 15 MG tablet Take 15 mg by mouth 2 (two) times daily.    . Cholecalciferol (VITAMIN D) 2000 UNITS CAPS Take 2,000 Units by mouth daily.    Marland Kitchen diltiazem (CARDIZEM CD) 120 MG 24 hr capsule Take 1 capsule (120 mg total) by mouth daily. 30 capsule 5  . docusate  sodium (COLACE) 100 MG capsule Take 100 mg by mouth at bedtime.    . DULoxetine (CYMBALTA) 60 MG capsule Take 80 mg by mouth daily. Takes  tablet along with a  tablet    . fluticasone (VERAMYST) 27.5 MCG/SPRAY nasal spray Place 2 sprays into the nose daily.     Marland Kitchen gabapentin (NEURONTIN) 300 MG capsule Take 600-900 mg by mouth 2 (two) times daily. Take  in AM and  in PM    . insulin glargine (LANTUS) 100 UNIT/ML injection Inject 34 Units into the skin at bedtime.     . insulin lispro (HUMALOG) 100 UNIT/ML injection Inject 0-15 Units into the skin as directed. Sliding scale per nursing home    . lactulose (CHRONULAC) 10 GM/15ML solution Take 30 mLs by mouth daily.     Marland Kitchen LORazepam (ATIVAN) 0.5 MG tablet Take 0.5 mg by mouth every 8 (eight) hours.    Marland Kitchen losartan (COZAAR) 50 MG tablet Take 50 mg by mouth daily. Hold for sbp<110, repeat blood pressure in 1 hour    . Melatonin 1 MG CAPS Take 3 mg by mouth at bedtime.     . metFORMIN (GLUCOPHAGE) 1000 MG tablet Take 1,000 mg by mouth 2 (two) times daily with a meal.    . ondansetron (ZOFRAN-ODT) 8 MG disintegrating tablet Take 8 mg by mouth every 8 (eight) hours as needed for nausea.    Marland Kitchen oxycodone (ROXICODONE) 30 MG immediate release tablet Take 30 mg by mouth every 3 (three) hours as needed for pain. Hold for sedation and RR < 10/min    . pantoprazole (PROTONIX) 40 MG tablet Take 40 mg by mouth daily.    . polyethylene glycol (MIRALAX / GLYCOLAX) packet Take 17 g by mouth 2 (two) times daily.     . potassium chloride (K-DUR,KLOR-CON) 10 MEQ tablet Take 10 mEq by mouth daily.    . Rivaroxaban (XARELTO) 20 MG TABS tablet Take 1 tablet (20 mg total) by mouth daily with supper. 30 tablet 0  . senna-docusate (SENOKOT-S) 8.6-50 MG per tablet Take 1 tablet by mouth 2 (two) times daily. Hold for diarrhea    . tamsulosin (FLOMAX) 0.4 MG CAPS capsule Take 0.4 mg by mouth daily.     Marland Kitchen torsemide (DEMADEX) 20 MG tablet Take 30 mg by mouth 2 (two)  times daily.     . traZODone (DESYREL) 100 MG tablet Take 100 mg by mouth at bedtime.     No current facility-administered medications for this visit.  Allergies:   Lisinopril and Morphine and related   Social History:  The patient  reports that he quit smoking about 2 years ago. His smoking use included Cigarettes. He has a 19 pack-year smoking history. He has never used smokeless tobacco. He reports that he drinks alcohol. He reports that he uses illicit drugs (Marijuana).   Family History:  The patient's family history includes Diabetes in his father; Hypertension in his maternal grandfather. There is no history of Colon cancer, Heart attack, or Stroke.   ROS:  Please see the history of present illness.   No bleeding issues   All other systems reviewed and negative.   PHYSICAL EXAM: VS:  There were no vitals taken for this visit. Well nourished, well developed, in no acute distress HEENT: normal Neck: no JVDat 90 degrees Cardiac:  normal S1, S2; RRR; no murmur Lungs:  clear to auscultation bilaterally, no wheezing, rhonchi or rales Abd: soft, nontender, no hepatomegaly Ext: trace bilateral LE edemawith chronic changes Skin: warm and dry Neuro:  CNs 2-12 intact, no focal abnormalities noted  EKG:   01/07/14   NSR, HR 77, no ST changes     ASSESSMENT AND PLAN:  1. Chronic diastolic heart failure with Right heart failure:  Volume stable.  Recent creatinine stable.  Continue current Rx. 2. Paroxysmal atrial flutter:  Maintaining NSR.  He is tolerating Xarelto.   3. HYPERTENSION:  Controlled.  4. CORONARY ARTERY DISEASE:  No angina.  He is not on ASA as he is on Xarelto.  Statin rx has not been initiated due to prior liver issues. 5. Type II or unspecified type diabetes mellitus without mention of complication, not stated as uncontrolled:  Recent A1c 7.9.  FU with PCP.  Disposition:  FU  in 6 mos   Charlton Hawseter Lauramae Kneisley

## 2014-08-25 ENCOUNTER — Encounter: Payer: Medicaid Other | Admitting: Cardiovascular Disease

## 2014-09-15 ENCOUNTER — Encounter: Payer: Self-pay | Admitting: Cardiovascular Disease

## 2015-08-08 ENCOUNTER — Encounter: Payer: Self-pay | Admitting: Physical Medicine & Rehabilitation

## 2019-10-28 DEATH — deceased
